# Patient Record
Sex: Female | Born: 1965 | Race: Black or African American | Hispanic: No | Marital: Married | Smoking: Former smoker
Health system: Southern US, Community
[De-identification: ages and names within clinical notes are randomized; demographics above are authoritative.]

## PROBLEM LIST (undated history)

## (undated) DIAGNOSIS — F410 Panic disorder [episodic paroxysmal anxiety] without agoraphobia: Secondary | ICD-10-CM

## (undated) DIAGNOSIS — R7303 Prediabetes: Secondary | ICD-10-CM

## (undated) DIAGNOSIS — D649 Anemia, unspecified: Secondary | ICD-10-CM

## (undated) DIAGNOSIS — Z9889 Other specified postprocedural states: Secondary | ICD-10-CM

## (undated) DIAGNOSIS — N393 Stress incontinence (female) (male): Secondary | ICD-10-CM

## (undated) DIAGNOSIS — Z87442 Personal history of urinary calculi: Secondary | ICD-10-CM

## (undated) DIAGNOSIS — Z973 Presence of spectacles and contact lenses: Secondary | ICD-10-CM

## (undated) DIAGNOSIS — J302 Other seasonal allergic rhinitis: Secondary | ICD-10-CM

## (undated) DIAGNOSIS — E269 Hyperaldosteronism, unspecified: Secondary | ICD-10-CM

## (undated) DIAGNOSIS — F32A Depression, unspecified: Secondary | ICD-10-CM

## (undated) DIAGNOSIS — Z862 Personal history of diseases of the blood and blood-forming organs and certain disorders involving the immune mechanism: Secondary | ICD-10-CM

## (undated) DIAGNOSIS — F411 Generalized anxiety disorder: Secondary | ICD-10-CM

## (undated) DIAGNOSIS — I10 Essential (primary) hypertension: Secondary | ICD-10-CM

## (undated) DIAGNOSIS — M1990 Unspecified osteoarthritis, unspecified site: Secondary | ICD-10-CM

## (undated) DIAGNOSIS — E785 Hyperlipidemia, unspecified: Secondary | ICD-10-CM

## (undated) DIAGNOSIS — D259 Leiomyoma of uterus, unspecified: Secondary | ICD-10-CM

## (undated) DIAGNOSIS — D509 Iron deficiency anemia, unspecified: Secondary | ICD-10-CM

## (undated) DIAGNOSIS — G8929 Other chronic pain: Secondary | ICD-10-CM

## (undated) DIAGNOSIS — R32 Unspecified urinary incontinence: Secondary | ICD-10-CM

## (undated) DIAGNOSIS — N950 Postmenopausal bleeding: Secondary | ICD-10-CM

## (undated) DIAGNOSIS — N200 Calculus of kidney: Secondary | ICD-10-CM

## (undated) DIAGNOSIS — G9782 Other postprocedural complications and disorders of nervous system: Secondary | ICD-10-CM

## (undated) DIAGNOSIS — G9600 Cerebrospinal fluid leak, unspecified: Secondary | ICD-10-CM

## (undated) DIAGNOSIS — K5900 Constipation, unspecified: Secondary | ICD-10-CM

## (undated) DIAGNOSIS — U071 COVID-19: Secondary | ICD-10-CM

## (undated) DIAGNOSIS — T7840XA Allergy, unspecified, initial encounter: Secondary | ICD-10-CM

## (undated) DIAGNOSIS — M545 Low back pain, unspecified: Secondary | ICD-10-CM

## (undated) DIAGNOSIS — E538 Deficiency of other specified B group vitamins: Secondary | ICD-10-CM

## (undated) DIAGNOSIS — Z8489 Family history of other specified conditions: Secondary | ICD-10-CM

## (undated) DIAGNOSIS — F329 Major depressive disorder, single episode, unspecified: Secondary | ICD-10-CM

## (undated) DIAGNOSIS — F419 Anxiety disorder, unspecified: Secondary | ICD-10-CM

## (undated) HISTORY — DX: Unspecified osteoarthritis, unspecified site: M19.90

## (undated) HISTORY — PX: SMALL INTESTINE SURGERY: SHX150

## (undated) HISTORY — DX: Constipation, unspecified: K59.00

## (undated) HISTORY — PX: ABDOMINAL HYSTERECTOMY: SHX81

## (undated) HISTORY — PX: TUBAL LIGATION: SHX77

## (undated) HISTORY — PX: OTHER SURGICAL HISTORY: SHX169

## (undated) HISTORY — DX: Allergy, unspecified, initial encounter: T78.40XA

## (undated) HISTORY — PX: WISDOM TOOTH EXTRACTION: SHX21

## (undated) HISTORY — PX: BUNIONECTOMY: SHX129

---

## 1999-08-05 HISTORY — PX: LAPAROSCOPIC TUBAL LIGATION: SUR803

## 1999-08-05 HISTORY — PX: TUBAL LIGATION: SHX77

## 2008-11-24 ENCOUNTER — Ambulatory Visit (HOSPITAL_COMMUNITY): Admission: RE | Admit: 2008-11-24 | Discharge: 2008-11-24 | Payer: Self-pay | Admitting: Obstetrics

## 2008-12-11 ENCOUNTER — Emergency Department (HOSPITAL_COMMUNITY): Admission: EM | Admit: 2008-12-11 | Discharge: 2008-12-11 | Payer: Self-pay | Admitting: Family Medicine

## 2009-03-27 ENCOUNTER — Emergency Department (HOSPITAL_COMMUNITY): Admission: EM | Admit: 2009-03-27 | Discharge: 2009-03-27 | Payer: Self-pay | Admitting: Emergency Medicine

## 2009-08-29 ENCOUNTER — Encounter: Admission: RE | Admit: 2009-08-29 | Discharge: 2009-08-29 | Payer: Self-pay | Admitting: Obstetrics

## 2010-10-29 ENCOUNTER — Other Ambulatory Visit: Payer: Self-pay | Admitting: Obstetrics

## 2010-10-29 DIAGNOSIS — Z1231 Encounter for screening mammogram for malignant neoplasm of breast: Secondary | ICD-10-CM

## 2010-11-05 ENCOUNTER — Ambulatory Visit: Payer: Self-pay

## 2010-11-12 LAB — POCT PREGNANCY, URINE: Preg Test, Ur: NEGATIVE

## 2010-11-12 LAB — POCT URINALYSIS DIP (DEVICE)
Ketones, ur: NEGATIVE mg/dL
Nitrite: NEGATIVE
Specific Gravity, Urine: 1.015 (ref 1.005–1.030)

## 2011-11-21 ENCOUNTER — Encounter (HOSPITAL_COMMUNITY): Payer: Self-pay | Admitting: Emergency Medicine

## 2011-11-21 ENCOUNTER — Emergency Department (HOSPITAL_COMMUNITY)
Admission: EM | Admit: 2011-11-21 | Discharge: 2011-11-21 | Disposition: A | Discharge status: Home / Self Care | Payer: Federal, State, Local not specified - PPO | Attending: Emergency Medicine | Admitting: Emergency Medicine

## 2011-11-21 DIAGNOSIS — R071 Chest pain on breathing: Secondary | ICD-10-CM | POA: Insufficient documentation

## 2011-11-21 DIAGNOSIS — Z87442 Personal history of urinary calculi: Secondary | ICD-10-CM | POA: Insufficient documentation

## 2011-11-21 DIAGNOSIS — M79609 Pain in unspecified limb: Secondary | ICD-10-CM | POA: Insufficient documentation

## 2011-11-21 DIAGNOSIS — R0789 Other chest pain: Secondary | ICD-10-CM

## 2011-11-21 DIAGNOSIS — R6884 Jaw pain: Secondary | ICD-10-CM | POA: Insufficient documentation

## 2011-11-21 HISTORY — DX: Anemia, unspecified: D64.9

## 2011-11-21 LAB — POCT I-STAT TROPONIN I: Troponin i, poc: 0 ng/mL (ref 0.00–0.08)

## 2011-11-21 LAB — CBC
MCH: 19.5 pg — ABNORMAL LOW (ref 26.0–34.0)
MCHC: 30.7 g/dL (ref 30.0–36.0)
MCV: 63.7 fL — ABNORMAL LOW (ref 78.0–100.0)
RDW: 17.6 % — ABNORMAL HIGH (ref 11.5–15.5)
WBC: 5.3 10*3/uL (ref 4.0–10.5)

## 2011-11-21 MED ORDER — MORPHINE SULFATE 4 MG/ML IJ SOLN
4.0000 mg | Freq: Once | INTRAMUSCULAR | Status: AC
  Administered 2011-11-21: 4 mg via INTRAVENOUS
  Filled 2011-11-21: qty 1

## 2011-11-21 MED ORDER — ASPIRIN 81 MG PO CHEW
CHEWABLE_TABLET | ORAL | Status: AC
  Administered 2011-11-21: 324 mg
  Filled 2011-11-21: qty 4

## 2011-11-21 MED ORDER — ASPIRIN 81 MG PO TABS
324.0000 mg | ORAL_TABLET | Freq: Once | ORAL | Status: AC
  Administered 2011-11-21: 324 mg via ORAL

## 2011-11-21 NOTE — ED Notes (Signed)
Pt pending second set of cardiac enzymes, if normal, pt will be d/c'ed home. Set to be drawn around 0815.

## 2011-11-21 NOTE — ED Notes (Signed)
MD at bedside. 

## 2011-11-21 NOTE — ED Provider Notes (Addendum)
History     CSN: [id number]  Arrival date & time 11/21/11  7829   First MD Initiated Contact with Patient 11/21/11 [phone]      Chief Complaint  Patient presents with  . Jaw Pain    (Consider location/radiation/quality/duration/timing/severity/associated sxs/prior treatment) HPI Complains of left jaw pain left arm pain and left anterior chest pain awakened her from sleep 3 AM today pain is constant mild nothing makes symptoms better or worse no associated nausea shortness of breath or sweatiness. No back pain or pain between scapulae. No treatment prior to coming here no other associated symptoms  . Pain is dull in quality Past Medical History  Diagnosis Date  . Kidney stones   . Anemia     No past surgical history on file.  No family history on file.  History  Substance Use Topics  . Smoking status: Not on file  . Smokeless tobacco: Not on file  . Alcohol Use:     OB History    Grav Para Term Preterm Abortions TAB SAB Ect Mult Living                  Review of Systems  Constitutional: Negative.   HENT: Negative.   Respiratory: Negative.   Cardiovascular: Positive for chest pain.  Gastrointestinal: Negative.   Genitourinary:       Currently on menses  Musculoskeletal: Negative.   Skin: Negative.   Neurological: Negative.   Hematological: Negative.   Psychiatric/Behavioral: Negative.   All other systems reviewed and are negative.    Allergies  Review of patient's allergies indicates no known allergies.  Home Medications  No current outpatient prescriptions on file.  BP 146/90  Pulse 62  Temp(Src) 97.6 F (36.4 C) (Oral)  Resp 13  Ht 5\' 9"  (1.753 m)  Wt 200 lb (90.719 kg)  BMI 29.53 kg/m2  SpO2 100%  Physical Exam  Nursing note and vitals reviewed. Constitutional: She appears well-developed and well-nourished.  HENT:  Head: Normocephalic and atraumatic.  Eyes: Conjunctivae are normal. Pupils are equal, round, and reactive to light.  Neck: Neck  supple. No tracheal deviation present. No thyromegaly present.  Cardiovascular: Normal rate and regular rhythm.   No murmur heard. Pulmonary/Chest: Effort normal and breath sounds normal.  Abdominal: Soft. Bowel sounds are normal. She exhibits no distension. There is no tenderness.  Musculoskeletal: Normal range of motion. She exhibits no edema and no tenderness.  Neurological: She is alert. Coordination normal.  Skin: Skin is warm and dry. No rash noted.  Psychiatric: She has a normal mood and affect.    ED Course  Procedures (including critical care time)  Date: 11/21/2011  Rate: 79  Rhythm: normal sinus rhythm  QRS Axis: normal  Intervals: normal  ST/T Wave abnormalities: normal  Conduction Disutrbances: none  Narrative Interpretation: unremarkable No old EKG for comparison   Labs Reviewed - No data to display No results found.   No diagnosis found.  8:50 AM patient asymptomatic and pain free after treatment with intravenous morphine  MDM  Strongly doubt acute coronary syndrome with atypical symptoms normal EKG 2 sets of normal markers in this young menstruating female Case [facility] Plan: Office will call patient on 11/24/2011 to arrange further outpatient testing Diagnoses #1atypical chest pain #2 elevated blood pressure       Doug Sou, MD 11/21/11 3086  Doug Sou, MD 11/21/11 [phone]

## 2011-11-21 NOTE — Discharge Instructions (Signed)
Chest Pain (Nonspecific) Chest pain has many causes. Your pain could be caused by something serious, such as a heart attack or a blood clot in the lungs. It could also be caused by something less serious, such as a chest bruise or a virus. Follow up with your doctor. More lab tests or other studies may be needed to find the cause of your pain. Most of the time, nonspecific chest pain will improve within 2 to 3 days of rest and mild pain medicine. HOME CARE  For chest bruises, you may put ice on the sore area for 15 to 20 minutes, 3 to 4 times a day. Do this only if it makes you or your child feel better.   Put ice in a plastic bag.   Place a towel between the skin and the bag.   Rest for the next 2 to 3 days.   Go back to work if the pain improves.   See your doctor if the pain lasts longer than 1 to 2 weeks.   Only take medicine as told by your doctor.   Quit smoking if you smoke.  GET HELP RIGHT AWAY IF:   There is more pain or pain that spreads to the arm, neck, jaw, back, or belly (abdomen).   You or your child has shortness of breath.   You or your child coughs more than usual or coughs up blood.   You or your child has very bad back or belly pain, feels sick to his or her stomach (nauseous), or throws up (vomits).   You or your child has very bad weakness.   You or your child passes out (faints).   You or your child has a temperature by mouth above 102 F (38.9 C), not controlled by medicine.  Any of these problems may be serious and may be an emergency. Do not wait to see if the problems will go away. Get medical help right away. Call your local emergency services 911 in [place] Do not drive yourself to the hospital. MAKE SURE YOU:   Understand these instructions.   Will watch this condition.   Will get help right away if you or your child is not doing well or gets worse.  Document Released: 01/07/2008 Document Revised: 07/10/2011 Document Reviewed:  01/07/2008 [facility] Patient Information 2012 [place], [place].   The [facility] cardiology office we'll call you on 11/24/2011 to schedule an appointment at their office next week to check her heart further. If you don't hear from the office by Monday afternoon call to schedule an appointment. Return if her condition worsens for any reason. Your blood pressure should be rechecked within the next 3 weeks

## 2011-11-21 NOTE — ED Notes (Signed)
PT reports 30 minutes ago she was awaken by jaw pain radiating down to arm; and very dull pain in back in between shoulder blades.

## 2011-11-24 ENCOUNTER — Encounter: Payer: Self-pay | Admitting: *Deleted

## 2011-11-24 ENCOUNTER — Encounter: Payer: Self-pay | Admitting: Cardiovascular Disease

## 2011-11-24 ENCOUNTER — Ambulatory Visit (INDEPENDENT_AMBULATORY_CARE_PROVIDER_SITE_OTHER): Payer: Federal, State, Local not specified - PPO | Admitting: Cardiovascular Disease

## 2011-11-24 VITALS — BP 152/100 | HR 78 | Ht 69.0 in | Wt 217.8 lb

## 2011-11-24 DIAGNOSIS — R079 Chest pain, unspecified: Secondary | ICD-10-CM | POA: Insufficient documentation

## 2011-11-24 DIAGNOSIS — I10 Essential (primary) hypertension: Secondary | ICD-10-CM | POA: Insufficient documentation

## 2011-11-24 DIAGNOSIS — R072 Precordial pain: Secondary | ICD-10-CM

## 2011-11-24 MED ORDER — HYDROCHLOROTHIAZIDE 25 MG PO TABS: 25.0000 mg | ORAL_TABLET | Freq: Every day | ORAL | Status: DC

## 2011-11-24 NOTE — Patient Instructions (Addendum)
Your physician has recommended you make the following change in your medication: START HCTZ 25mg  take one by mouth daily  Your physician has requested that you have an exercise tolerance test in 2 WEEKS. For further information please visit [email or web address] Please also follow instruction sheet, as given.  Your physician recommends that you return for a FASTING LIPID, LIVER and BMP in 2 WEEKS--nothing to eat or drink after midnight  You have been referred to [facility].   Your physician recommends that you schedule a follow-up appointment as needed with Dr Excell Seltzer.

## 2011-11-24 NOTE — Assessment & Plan Note (Signed)
I think anxiety is playing a significant role, but the patient has markedly elevated blood pressure. I suspect there is at least some component of essential hypertension here. I recommended that she start hydrochlorothiazide 25 mg daily. She will have a followup metabolic panel drawn in a few weeks. We'll reassess her blood pressure when she comes in for treadmill study.

## 2011-11-24 NOTE — Assessment & Plan Note (Signed)
The patient has chest pain with typical and atypical features. She is at low risk of coronary disease considering her age and lack of heart attack risk factors. There is a clear component of anxiety. With a normal resting EKG, I recommended an exercise treadmill test. Which comes in for a treadmill study of like her to have fasting lipids drawn. I also recommended that she establish with a primary care physician and a referral will be made.

## 2011-11-24 NOTE — Progress Notes (Signed)
HPI:  46 year old woman presenting for evaluation of chest pain. The patient was recently seen in the emergency department with chest discomfort. She complains of a constant pain in the center of her chest. This is reproduced with palpation. The pain radiates to her jaw and left arm. It comes and goes but it is there most of the time. She associates this with stress. She's had an extreme amount of stress at work recently and is very anxious overall. She notes that her blood pressures been elevated over recent weeks, but this has not been a problem in the past. The patient has no exertional chest tightness or discomfort. During her emergency room evaluation. Serial cardiac enzymes were negative and EKGs were within normal limits. Outpatient cardiology followup is recommended. She has no personal history of cardiac disease. There is no family history of coronary artery disease or myocardial infarction. The patient is a nonsmoker. She does not know her lipid status. She doesn't follow with her primary care physician.  Outpatient Encounter Prescriptions as of 11/24/2011  Medication Sig Dispense Refill  . acetaminophen (TYLENOL) 325 MG tablet Take 650 mg by mouth as needed.      Marland Kitchen DISCONTD: acetaminophen-codeine (TYLENOL #3) 300-30 MG per tablet Take 1 tablet by mouth as needed.      Marland Kitchen DISCONTD: fexofenadine-pseudoephedrine (ALLEGRA-D) 60-120 MG per tablet Take 1 tablet by mouth 2 (two) times daily.      Marland Kitchen DISCONTD: Phenyleph-CPM-DM-APAP (TYLENOL COLD MULTI-SYMPTOM PO) Take by mouth as needed.        Review of patient's allergies indicates no known allergies.  Past Medical History  Diagnosis Date  . Kidney stones   . Anemia     Past Surgical History  Procedure Date  . Caesarean section   . Tubal ligation   . Cystoscopic     extraction of ureteric calculus without disintegration    History   Social History  . Marital Status: Married    Spouse Name: N/A    Number of Children: N/A  . Years  of Education: N/A   Occupational History  . Not on file.   Social History Main Topics  . Smoking status: Former Games developer  . Smokeless tobacco: Not on file  . Alcohol Use: No  . Drug Use: No  . Sexually Active: Not on file   Other Topics Concern  . Not on file   Social History Narrative  . No narrative on file    Family History  Problem Relation Age of Onset  . Asthma    . Hypothyroidism      ROS: General: no fevers/chills/night sweats Eyes: no blurry vision, diplopia, or amaurosis ENT: no sore throat or hearing loss Resp: no cough, wheezing, or hemoptysis CV: no edema. Positive for palpitations GI: no abdominal pain, nausea, vomiting, diarrhea, or constipation GU: no dysuria, frequency, or hematuria Skin: no rash Neuro: no headache, numbness, tingling, or weakness of extremities Musculoskeletal: no joint pain or swelling Heme: no bleeding, DVT, or easy bruising Endo: no polydipsia or polyuria  BP 152/100  Pulse 78  Ht 5\' 9"  (1.753 m)  Wt 98.793 kg (217 lb 12.8 oz)  BMI 32.16 kg/m2  PHYSICAL EXAM: Pt is alert and oriented, WD, WN, pleasant, anxious overweight woman in no distress. HEENT: normal Neck: JVP normal. Carotid upstrokes normal without bruits. No thyromegaly. Chest: Equal expansion, there is tenderness over the left parasternal region reproducing the patient's chest pain Lungs: equal expansion, clear bilaterally CV: Apex is discrete and nondisplaced,  RRR without murmur or gallop Abd: soft, NT, +BS, no bruit, no hepatosplenomegaly Back: no CVA tenderness Ext: no C/C/E        DP/PT pulses intact and = Skin: warm and dry without rash Neuro: CNII-XII intact             Strength intact = bilaterally  EKG:  Normal sinus rhythm 78 beats per minute, within normal limits.  ASSESSMENT AND PLAN:

## 2011-12-11 ENCOUNTER — Ambulatory Visit (INDEPENDENT_AMBULATORY_CARE_PROVIDER_SITE_OTHER): Payer: Federal, State, Local not specified - PPO | Admitting: Nurse Practitioner

## 2011-12-11 ENCOUNTER — Encounter: Payer: Self-pay | Admitting: Nurse Practitioner

## 2011-12-11 DIAGNOSIS — R072 Precordial pain: Secondary | ICD-10-CM

## 2011-12-11 DIAGNOSIS — I10 Essential (primary) hypertension: Secondary | ICD-10-CM

## 2011-12-11 NOTE — Procedures (Signed)
Exercise Treadmill Test  Pre-Exercise Testing Evaluation Rhythm: normal sinus  Rate: 94   PR:  .14 QRS:  .08  QT:  .32 QTc: .41     Test  Exercise Tolerance Test Ordering MD: Tonny Bollman, MD  Interpreting MD: Ward Givens NP  Unique Test No: 1  Treadmill:  1  Indication for ETT: chest pain - rule out ischemia  Contraindication to ETT: No   Stress Modality: exercise - treadmill  Cardiac Imaging Performed: non   Protocol: standard Bruce - maximal  Max BP: 163/62  Max MPHR (bpm):  176 85% MPR (bpm):  149  MPHR obtained (bpm):  176 % MPHR obtained:  100%  Reached 85% MPHR (min:sec):  4:12 Total Exercise Time (min-sec):  9:00  Workload in METS:  10.1 Borg Scale: 17  Reason ETT Terminated:  fatigue    ST Segment Analysis At Rest: normal ST segments - no evidence of significant ST depression - TWI III, aVF @ baseline. With Exercise: no evidence of significant ST depression  Other Information Arrhythmia:  No Angina during ETT:  absent (0) Quality of ETT:  diagnostic  ETT Interpretation:  normal - no evidence of ischemia by ST analysis  Comments: Good exercise tolerance. No chest pain.  No acute st/t changes.  Recommendations: F/u Dr. Excell Seltzer

## 2011-12-25 ENCOUNTER — Other Ambulatory Visit (INDEPENDENT_AMBULATORY_CARE_PROVIDER_SITE_OTHER): Payer: Federal, State, Local not specified - PPO

## 2011-12-25 DIAGNOSIS — R072 Precordial pain: Secondary | ICD-10-CM

## 2011-12-25 DIAGNOSIS — I10 Essential (primary) hypertension: Secondary | ICD-10-CM

## 2011-12-25 LAB — LIPID PANEL
Cholesterol: 118 mg/dL (ref 0–200)
LDL Cholesterol: 58 mg/dL (ref 0–99)
Triglycerides: 82 mg/dL (ref 0.0–149.0)

## 2011-12-25 LAB — BASIC METABOLIC PANEL
BUN: 10 mg/dL (ref 6–23)
Calcium: 9.9 mg/dL (ref 8.4–10.5)
Chloride: 102 mEq/L (ref 96–112)
Creatinine, Ser: 0.7 mg/dL (ref 0.4–1.2)

## 2011-12-25 LAB — HEPATIC FUNCTION PANEL
ALT: 17 U/L (ref 0–35)
Total Bilirubin: 0.3 mg/dL (ref 0.3–1.2)

## 2012-01-09 ENCOUNTER — Ambulatory Visit (INDEPENDENT_AMBULATORY_CARE_PROVIDER_SITE_OTHER): Payer: Federal, State, Local not specified - PPO | Admitting: Internal Medicine

## 2012-01-09 ENCOUNTER — Other Ambulatory Visit (INDEPENDENT_AMBULATORY_CARE_PROVIDER_SITE_OTHER): Payer: Federal, State, Local not specified - PPO

## 2012-01-09 ENCOUNTER — Encounter: Payer: Self-pay | Admitting: Internal Medicine

## 2012-01-09 VITALS — BP 120/70 | HR 73 | Temp 98.6°F | Resp 16 | Ht 69.0 in | Wt 219.0 lb

## 2012-01-09 DIAGNOSIS — D649 Anemia, unspecified: Secondary | ICD-10-CM

## 2012-01-09 DIAGNOSIS — Z23 Encounter for immunization: Secondary | ICD-10-CM

## 2012-01-09 DIAGNOSIS — I10 Essential (primary) hypertension: Secondary | ICD-10-CM

## 2012-01-09 DIAGNOSIS — E669 Obesity, unspecified: Secondary | ICD-10-CM | POA: Insufficient documentation

## 2012-01-09 DIAGNOSIS — E538 Deficiency of other specified B group vitamins: Secondary | ICD-10-CM

## 2012-01-09 DIAGNOSIS — Z1231 Encounter for screening mammogram for malignant neoplasm of breast: Secondary | ICD-10-CM

## 2012-01-09 DIAGNOSIS — D519 Vitamin B12 deficiency anemia, unspecified: Secondary | ICD-10-CM | POA: Insufficient documentation

## 2012-01-09 LAB — CBC WITH DIFFERENTIAL/PLATELET
Basophils Relative: 1.4 % (ref 0.0–3.0)
Eosinophils Absolute: 0.2 10*3/uL (ref 0.0–0.7)
Eosinophils Relative: 2.9 % (ref 0.0–5.0)
HCT: 36.5 % (ref 36.0–46.0)
Hemoglobin: 11.3 g/dL — ABNORMAL LOW (ref 12.0–15.0)
MCHC: 30.9 g/dL (ref 30.0–36.0)
MCV: 65.7 fl — ABNORMAL LOW (ref 78.0–100.0)
Monocytes Absolute: 0.7 10*3/uL (ref 0.1–1.0)
Neutro Abs: 2.7 10*3/uL (ref 1.4–7.7)
Neutrophils Relative %: 49.3 % (ref 43.0–77.0)
RBC: 5.55 Mil/uL — ABNORMAL HIGH (ref 3.87–5.11)
WBC: 5.4 10*3/uL (ref 4.5–10.5)

## 2012-01-09 LAB — TSH: TSH: 1.02 u[IU]/mL (ref 0.35–5.50)

## 2012-01-09 LAB — FOLATE: Folate: >24.8 ng/mL (ref >5.9)

## 2012-01-09 LAB — IBC PANEL: Saturation Ratios: 11.2 % — ABNORMAL LOW (ref 20.0–50.0)

## 2012-01-09 MED ORDER — CYANOCOBALAMIN 1000 MCG/ML IJ SOLN
1000.0000 ug | Freq: Once | INTRAMUSCULAR | Status: AC
  Administered 2012-01-09: 1000 ug via INTRAMUSCULAR

## 2012-01-09 MED ORDER — PHENTERMINE HCL 37.5 MG PO CAPS: 37.5000 mg | ORAL_CAPSULE | ORAL | Status: DC

## 2012-01-09 NOTE — Progress Notes (Signed)
  Subjective:    Patient ID: Mary Sanford, female    DOB: 1965/08/15, 46 y.o.   MRN: [id number]  Anemia Presents for follow-up visit. Symptoms include light-headedness. There has been no abdominal pain, anorexia, bruising/bleeding easily, confusion, fever, leg swelling, malaise/fatigue, pallor, palpitations, paresthesias, pica or weight loss. Signs of blood loss that are present include menorrhagia. Signs of blood loss that are not present include hematemesis, hematochezia, melena and vaginal bleeding. There are no compliance problems.       Review of Systems  Constitutional: Positive for unexpected weight change (some weight gain). Negative for fever, chills, weight loss, malaise/fatigue, diaphoresis, activity change, appetite change and fatigue.  HENT: Negative.   Eyes: Negative.   Respiratory: Negative for apnea, cough, choking, chest tightness, shortness of breath, wheezing and stridor.   Cardiovascular: Negative for chest pain, palpitations and leg swelling.  Gastrointestinal: Negative for nausea, vomiting, abdominal pain, diarrhea, constipation, blood in stool, melena, hematochezia, abdominal distention, anal bleeding, anorexia and hematemesis.  Genitourinary: Positive for menorrhagia. Negative for vaginal bleeding.  Musculoskeletal: Negative for myalgias, back pain, joint swelling, arthralgias and gait problem.  Skin: Negative for color change, pallor and rash.  Neurological: Positive for light-headedness. Negative for dizziness, tremors, seizures, syncope, facial asymmetry, speech difficulty, weakness, numbness, headaches and paresthesias.  Hematological: Negative for adenopathy. Does not bruise/bleed easily.  Psychiatric/Behavioral: Negative.  Negative for confusion.       Objective:   Physical Exam  Vitals reviewed. Constitutional: She is oriented to person, place, and time. She appears well-developed and well-nourished. No distress.  HENT:  Head: Normocephalic and  atraumatic.  Mouth/Throat: Oropharynx is clear and moist. No oropharyngeal exudate.  Eyes: Conjunctivae are normal. Right eye exhibits no discharge. Left eye exhibits no discharge. No scleral icterus.  Neck: Normal range of motion. Neck supple. No JVD present. No tracheal deviation present. No thyromegaly present.  Cardiovascular: Normal rate, regular rhythm, normal heart sounds and intact distal pulses.  Exam reveals no gallop and no friction rub.   No murmur heard. Pulmonary/Chest: Effort normal and breath sounds normal. No stridor. No respiratory distress. She has no wheezes. She has no rales. She exhibits no tenderness.  Abdominal: Soft. Bowel sounds are normal. She exhibits no distension and no mass. There is no tenderness. There is no rebound and no guarding.  Musculoskeletal: Normal range of motion. She exhibits no edema and no tenderness.  Lymphadenopathy:    She has no cervical adenopathy.  Neurological: She is oriented to person, place, and time.  Skin: Skin is warm and dry. No rash noted. She is not diaphoretic. No erythema. No pallor.  Psychiatric: She has a normal mood and affect. Her behavior is normal. Judgment and thought content normal.      Lab Results  Component Value Date   WBC 5.3 11/21/2011   HGB 10.7* 11/21/2011   HCT 34.9* 11/21/2011   PLT 275 11/21/2011   GLUCOSE 93 12/25/2011   CHOL 118 12/25/2011   TRIG 82.0 12/25/2011   HDL 43.40 12/25/2011   LDLCALC 58 12/25/2011   ALT 17 12/25/2011   AST 18 12/25/2011   NA 136 12/25/2011   K 3.8 12/25/2011   CL 102 12/25/2011   CREATININE 0.7 12/25/2011   BUN 10 12/25/2011   CO2 27 12/25/2011      Assessment & Plan:

## 2012-01-09 NOTE — Patient Instructions (Signed)
Anemia, Nonspecific Your exam and blood tests show you are anemic. This means your blood (hemoglobin) level is low. Normal hemoglobin values are 12 to 15 g/dL for females and 14 to 17 g/dL for males. Make a note of your hemoglobin level today. The hematocrit percent is also used to measure anemia. A normal hematocrit is 38% to 46% in females and 42% to 49% in males. Make a note of your hematocrit level today. CAUSES  Anemia can be due to many different causes.  Excessive bleeding from periods (in women).   Intestinal bleeding.   Poor nutrition.   Kidney, thyroid, liver, and bone marrow diseases.  SYMPTOMS  Anemia can come on suddenly (acute). It can also come on slowly. Symptoms can include:  Minor weakness.   Dizziness.   Palpitations.   Shortness of breath.  Symptoms may be absent until half your hemoglobin is missing if it comes on slowly. Anemia due to acute blood loss from an injury or internal bleeding may require blood transfusion if the loss is severe. Hospital care is needed if you are anemic and there is significant continual blood loss. TREATMENT   Stool tests for blood (Hemoccult) and additional lab tests are often needed. This determines the best treatment.   Further checking on your condition and your response to treatment is very important. It often takes many weeks to correct anemia.  Depending on the cause, treatment can include:  Supplements of iron.   Vitamins B12 and folic acid.   Hormone medicines.If your anemia is due to bleeding, finding the cause of the blood loss is very important. This will help avoid further problems.  SEEK IMMEDIATE MEDICAL CARE IF:   You develop fainting, extreme weakness, shortness of breath, or chest pain.   You develop heavy vaginal bleeding.   You develop bloody or black, tarry stools or vomit up blood.   You develop a high fever, rash, repeated vomiting, or dehydration.  Document Released: 08/28/2004 Document Revised:  07/10/2011 Document Reviewed: 06/05/2009 [employer] Patient Information 2012 [employer]. 

## 2012-01-11 ENCOUNTER — Encounter: Payer: Self-pay | Admitting: Internal Medicine

## 2012-01-12 DIAGNOSIS — Z1231 Encounter for screening mammogram for malignant neoplasm of breast: Secondary | ICD-10-CM | POA: Insufficient documentation

## 2012-01-12 NOTE — Assessment & Plan Note (Signed)
She wants to try an appetite suppressant so I wrote for phentermine, I have asked her to return in 3-4 weeks for a recheck on her BP, she will let me know sooner if she has any SE's from phentermine

## 2012-01-12 NOTE — Assessment & Plan Note (Signed)
I will recheck her CBC and will look at her vitamin levels as well 

## 2012-01-12 NOTE — Assessment & Plan Note (Signed)
Her BP is well controlled 

## 2012-01-23 ENCOUNTER — Ambulatory Visit (INDEPENDENT_AMBULATORY_CARE_PROVIDER_SITE_OTHER): Payer: Federal, State, Local not specified - PPO

## 2012-01-23 DIAGNOSIS — D649 Anemia, unspecified: Secondary | ICD-10-CM

## 2012-01-23 MED ORDER — CYANOCOBALAMIN 1000 MCG/ML IJ SOLN
1000.0000 ug | Freq: Once | INTRAMUSCULAR | Status: AC
  Administered 2012-01-23: 1000 ug via INTRAMUSCULAR

## 2012-02-04 ENCOUNTER — Ambulatory Visit: Payer: Federal, State, Local not specified - PPO

## 2012-02-09 ENCOUNTER — Telehealth: Payer: Self-pay | Admitting: Internal Medicine

## 2012-02-09 ENCOUNTER — Ambulatory Visit (INDEPENDENT_AMBULATORY_CARE_PROVIDER_SITE_OTHER): Payer: Federal, State, Local not specified - PPO | Admitting: Internal Medicine

## 2012-02-09 ENCOUNTER — Encounter: Payer: Self-pay | Admitting: Internal Medicine

## 2012-02-09 VITALS — BP 128/76 | HR 88 | Temp 98.0°F | Resp 16 | Wt 203.0 lb

## 2012-02-09 DIAGNOSIS — I10 Essential (primary) hypertension: Secondary | ICD-10-CM

## 2012-02-09 DIAGNOSIS — D649 Anemia, unspecified: Secondary | ICD-10-CM

## 2012-02-09 DIAGNOSIS — E669 Obesity, unspecified: Secondary | ICD-10-CM

## 2012-02-09 MED ORDER — PHENTERMINE HCL 37.5 MG PO CAPS: 37.5000 mg | ORAL_CAPSULE | ORAL | Status: DC

## 2012-02-09 MED ORDER — CYANOCOBALAMIN 1000 MCG/ML IJ SOLN
1000.0000 ug | Freq: Once | INTRAMUSCULAR | Status: AC
  Administered 2012-02-09: 1000 ug via INTRAMUSCULAR

## 2012-02-09 NOTE — Telephone Encounter (Signed)
Forward 40 pages to Dr. Sanda Linger for review on 02-09-12 ym

## 2012-02-09 NOTE — Assessment & Plan Note (Signed)
She is doing well on phentermine

## 2012-02-09 NOTE — Patient Instructions (Signed)

## 2012-02-09 NOTE — Progress Notes (Signed)
  Subjective:    Patient ID: Mary Sanford, female    DOB: 1966/04/09, 46 y.o.   MRN: [id number]  Hypertension This is a chronic problem. The current episode started more than 1 year ago. The problem has been gradually improving since onset. The problem is controlled. Pertinent negatives include no anxiety, blurred vision, chest pain, headaches, malaise/fatigue, neck pain, orthopnea, palpitations, peripheral edema, PND, shortness of breath or sweats. Past treatments include diuretics. The current treatment provides significant improvement. There are no compliance problems.       Review of Systems  Constitutional: Negative.  Negative for malaise/fatigue.  HENT: Negative.  Negative for neck pain.   Eyes: Negative.  Negative for blurred vision.  Respiratory: Negative.  Negative for shortness of breath.   Cardiovascular: Negative.  Negative for chest pain, palpitations, orthopnea and PND.  Gastrointestinal: Negative.   Genitourinary: Negative.   Musculoskeletal: Negative.   Skin: Negative.   Neurological: Negative.  Negative for headaches.  Hematological: Negative.   Psychiatric/Behavioral: Negative.  Negative for suicidal ideas, behavioral problems, confusion, dysphoric mood and agitation. The patient is not nervous/anxious.        Objective:   Physical Exam  Vitals reviewed. Constitutional: She is oriented to person, place, and time. She appears well-developed and well-nourished. No distress.  HENT:  Head: Normocephalic and atraumatic.  Mouth/Throat: Oropharynx is clear and moist. No oropharyngeal exudate.  Eyes: Conjunctivae are normal. Right eye exhibits no discharge. Left eye exhibits no discharge. No scleral icterus.  Neck: Normal range of motion. Neck supple. No JVD present. No tracheal deviation present. No thyromegaly present.  Cardiovascular: Normal rate, regular rhythm, normal heart sounds and intact distal pulses.  Exam reveals no gallop and no friction rub.   No murmur  heard. Pulmonary/Chest: Effort normal and breath sounds normal. No stridor. No respiratory distress. She has no wheezes. She has no rales. She exhibits no tenderness.  Abdominal: Soft. Bowel sounds are normal. She exhibits no distension and no mass. There is no tenderness. There is no rebound and no guarding.  Musculoskeletal: Normal range of motion. She exhibits no edema and no tenderness.  Lymphadenopathy:    She has no cervical adenopathy.  Neurological: She is oriented to person, place, and time.  Skin: Skin is warm and dry. No rash noted. She is not diaphoretic. No erythema. No pallor.  Psychiatric: She has a normal mood and affect. Her behavior is normal. Judgment and thought content normal.      Lab Results  Component Value Date   WBC 5.4 01/09/2012   HGB 11.3* 01/09/2012   HCT 36.5 01/09/2012   PLT 245.0 01/09/2012   GLUCOSE 93 12/25/2011   CHOL 118 12/25/2011   TRIG 82.0 12/25/2011   HDL 43.40 12/25/2011   LDLCALC 58 12/25/2011   ALT 17 12/25/2011   AST 18 12/25/2011   NA 136 12/25/2011   K 3.8 12/25/2011   CL 102 12/25/2011   CREATININE 0.7 12/25/2011   BUN 10 12/25/2011   CO2 27 12/25/2011   TSH 1.02 01/09/2012      Assessment & Plan:

## 2012-02-09 NOTE — Assessment & Plan Note (Signed)
Her BP is well controlled 

## 2012-02-18 ENCOUNTER — Ambulatory Visit (HOSPITAL_COMMUNITY)
Admission: RE | Admit: 2012-02-18 | Discharge: 2012-02-18 | Disposition: A | Discharge status: Home / Self Care | Payer: Federal, State, Local not specified - PPO | Source: Ambulatory Visit | Attending: Internal Medicine | Admitting: Internal Medicine

## 2012-02-18 DIAGNOSIS — Z1231 Encounter for screening mammogram for malignant neoplasm of breast: Secondary | ICD-10-CM | POA: Insufficient documentation

## 2012-02-19 LAB — HM MAMMOGRAPHY: HM Mammogram: NORMAL

## 2012-02-23 ENCOUNTER — Ambulatory Visit: Payer: Federal, State, Local not specified - PPO

## 2012-02-24 ENCOUNTER — Ambulatory Visit (INDEPENDENT_AMBULATORY_CARE_PROVIDER_SITE_OTHER): Payer: Federal, State, Local not specified - PPO

## 2012-02-24 DIAGNOSIS — D649 Anemia, unspecified: Secondary | ICD-10-CM

## 2012-02-24 MED ORDER — CYANOCOBALAMIN 1000 MCG/ML IJ SOLN
1000.0000 ug | Freq: Once | INTRAMUSCULAR | Status: AC
  Administered 2012-02-24: 1000 ug via INTRAMUSCULAR

## 2012-03-09 ENCOUNTER — Ambulatory Visit: Payer: Federal, State, Local not specified - PPO

## 2012-03-10 ENCOUNTER — Telehealth: Payer: Self-pay

## 2012-03-10 MED ORDER — CYANOCOBALAMIN 500 MCG/0.1ML NA SOLN: 0.1000 mL | NASAL | Status: DC

## 2012-03-10 NOTE — Telephone Encounter (Signed)
Pt called inquiring whether she can take equivalent dosage of B-12 since injection is currently on back order, please advise.

## 2012-03-10 NOTE — Telephone Encounter (Signed)
Start nascobal ns 

## 2012-04-22 ENCOUNTER — Ambulatory Visit (INDEPENDENT_AMBULATORY_CARE_PROVIDER_SITE_OTHER): Payer: Federal, State, Local not specified - PPO | Admitting: General Practice

## 2012-04-22 DIAGNOSIS — D649 Anemia, unspecified: Secondary | ICD-10-CM

## 2012-04-22 MED ORDER — CYANOCOBALAMIN 1000 MCG/ML IJ SOLN
1000.0000 ug | Freq: Once | INTRAMUSCULAR | Status: AC
  Administered 2012-04-22: 1000 ug via INTRAMUSCULAR

## 2012-04-30 ENCOUNTER — Encounter (HOSPITAL_COMMUNITY): Payer: Self-pay | Admitting: *Deleted

## 2012-04-30 ENCOUNTER — Emergency Department (INDEPENDENT_AMBULATORY_CARE_PROVIDER_SITE_OTHER)
Admission: EM | Admit: 2012-04-30 | Discharge: 2012-04-30 | Disposition: A | Discharge status: Home / Self Care | Payer: Federal, State, Local not specified - PPO | Source: Home / Self Care

## 2012-04-30 DIAGNOSIS — F419 Anxiety disorder, unspecified: Secondary | ICD-10-CM

## 2012-04-30 DIAGNOSIS — R42 Dizziness and giddiness: Secondary | ICD-10-CM

## 2012-04-30 DIAGNOSIS — F410 Panic disorder [episodic paroxysmal anxiety] without agoraphobia: Secondary | ICD-10-CM

## 2012-04-30 DIAGNOSIS — J309 Allergic rhinitis, unspecified: Secondary | ICD-10-CM

## 2012-04-30 DIAGNOSIS — F411 Generalized anxiety disorder: Secondary | ICD-10-CM

## 2012-04-30 NOTE — ED Provider Notes (Signed)
History     CSN: [id number]  Arrival date & time 04/30/12  1145   None     Chief Complaint  Patient presents with  . Headache  . Dizziness    (Consider location/radiation/quality/duration/timing/severity/associated sxs/prior treatment) HPI Comments: 46 year old female was sitting at her desk at work today when she slowly developed dizziness followed by headache on the left side of her head and nasal stuffiness. As she stood up she felt like she going to pass out. Head begin feeling stuffy and she began to panic. She has a history of panic attacks and as this one started her symptoms seem to snowball and developed severe anxiety. There was no syncope. She believes that she may have developed some allergy to a plant that was set on her desk just prior to her developing symptoms she also states her job is very stressful and she has been feeling stress most of the morning. She agreed that she had a small amount of underlying anxiety while at work prior to experiencing any symptoms as stated above and she developed dizziness and left-sided craniofacial discomfort her symptoms got worse and that's when the panic attack develop. When she was able to calm down most her symptoms abated. Symptoms are remained aren't nasal stuffiness and a mild discomfort in the left face. Bending over with face parallel to the floor produces a fullness in the left side of the face. He denies sneezing or tearing she also denies problems with vision speech hearing or swallowing, no focal weakness or paresthesias. Is also noteworthy that she is currently taking phentermine for weight loss   Past Medical History  Diagnosis Date  . Kidney stones   . Anemia     Past Surgical History  Procedure Date  . Caesarean section   . Tubal ligation   . Cystoscopic     extraction of ureteric calculus without disintegration    Family History  Problem Relation Age of Onset  . Asthma    . Hypothyroidism      History    Substance Use Topics  . Smoking status: Former Games developer  . Smokeless tobacco: Not on file  . Alcohol Use: No    OB History    Grav Para Term Preterm Abortions TAB SAB Ect Mult Living                  Review of Systems  Constitutional: Negative for fever, activity change, appetite change and fatigue.  HENT: Positive for congestion, rhinorrhea and sinus pressure. Negative for sore throat, facial swelling, trouble swallowing, neck pain, neck stiffness, voice change, postnasal drip and ear discharge.   Respiratory: Negative for cough, choking, chest tightness and shortness of breath.   Gastrointestinal: Negative.   Genitourinary: Negative.   Musculoskeletal: Negative.   Skin: Negative.   Neurological: Positive for dizziness and headaches. Negative for tremors, seizures, syncope, facial asymmetry, speech difficulty and weakness.  Psychiatric/Behavioral: The patient is nervous/anxious.     Allergies  Review of patient's allergies indicates no known allergies.  Home Medications   Current Outpatient Rx  Name Route Sig Dispense Refill  . CYANOCOBALAMIN 500 MCG/0.1ML NA SOLN Nasal Place 0.1 mLs (500 mcg total) into the nose once a week. 2.3 mL 11  . HYDROCHLOROTHIAZIDE 25 MG PO TABS Oral Take 1 tablet (25 mg total) by mouth daily. 30 tablet 11  . ONE-DAILY MULTI VITAMINS PO TABS Oral Take 1 tablet by mouth daily.    Marland Kitchen PHENTERMINE HCL 37.5 MG PO CAPS Oral  Take 1 capsule (37.5 mg total) by mouth every morning. 30 capsule 3  . ACETAMINOPHEN 325 MG PO TABS Oral Take 650 mg by mouth as needed.      BP 119/73  Pulse 106  Temp 98.2 F (36.8 C)  Resp 18  SpO2 100%  LMP 04/10/2012  Physical Exam  Constitutional: She is oriented to person, place, and time. She appears well-developed and well-nourished. No distress.  HENT:  Head: Normocephalic and atraumatic.  Mouth/Throat: Oropharynx is clear and moist. No oropharyngeal exudate.  Eyes: EOM are normal. Pupils are equal, round, and  reactive to light.  Neck: Normal range of motion. Neck supple.  Cardiovascular: Normal rate and normal heart sounds.   Pulmonary/Chest: Effort normal and breath sounds normal. No respiratory distress. She has no wheezes.  Abdominal: Soft. There is no tenderness.  Musculoskeletal: Normal range of motion. She exhibits no edema and no tenderness.  Neurological: She is alert and oriented to person, place, and time. No cranial nerve deficit.       Cranial nerves II through XII intact. Fine motor skills are intact. Romberg is negative. Heel to toe walk is balanced. No dizziness was experienced during any of these maneuvers. She was also laid back on the bed turned her head to the right than the left and straight up and sat up quickly the symptoms were reproduced. No nystagmus. She had denied vertigo. Logically completely intact.  Skin: Skin is warm and dry.  Psychiatric: She has a normal mood and affect.    ED Course  Procedures (including critical care time)  Labs Reviewed - No data to display No results found.   1. Anxiety   2. Panic disorder   3. Dizziness       MDM  Reassurance. Recommend stopping the Phentermine Claritin for allergies, no decongestants.          Hayden Rasmussen, NP 04/30/12 1331

## 2012-04-30 NOTE — ED Notes (Signed)
"   i started not feeling right 3 or 4 days ago but today all of a sudden I started getting pressure in my head and it felt like my heart was racing and I got really panicky. It felt similar to a panic attack but maybe an allergic reaction - I still feel a little dizzy"

## 2012-04-30 NOTE — ED Provider Notes (Signed)
Medical screening examination/treatment/procedure(s) were performed by non-physician practitioner and as supervising physician I was immediately available for consultation/collaboration.  Leslee Home, M.D.   Reuben Likes, MD 04/30/12 2232

## 2012-05-06 ENCOUNTER — Ambulatory Visit (INDEPENDENT_AMBULATORY_CARE_PROVIDER_SITE_OTHER): Payer: Federal, State, Local not specified - PPO

## 2012-05-06 DIAGNOSIS — D649 Anemia, unspecified: Secondary | ICD-10-CM

## 2012-05-06 MED ORDER — CYANOCOBALAMIN 1000 MCG/ML IJ SOLN
1000.0000 ug | Freq: Once | INTRAMUSCULAR | Status: AC
  Administered 2012-05-06: 1000 ug via INTRAMUSCULAR

## 2012-05-13 ENCOUNTER — Other Ambulatory Visit (INDEPENDENT_AMBULATORY_CARE_PROVIDER_SITE_OTHER): Payer: Federal, State, Local not specified - PPO

## 2012-05-13 ENCOUNTER — Ambulatory Visit (INDEPENDENT_AMBULATORY_CARE_PROVIDER_SITE_OTHER): Payer: Federal, State, Local not specified - PPO | Admitting: Internal Medicine

## 2012-05-13 ENCOUNTER — Encounter: Payer: Self-pay | Admitting: Internal Medicine

## 2012-05-13 VITALS — BP 120/80 | HR 68 | Temp 98.8°F | Resp 16 | Wt 196.0 lb

## 2012-05-13 DIAGNOSIS — I10 Essential (primary) hypertension: Secondary | ICD-10-CM

## 2012-05-13 DIAGNOSIS — E669 Obesity, unspecified: Secondary | ICD-10-CM

## 2012-05-13 DIAGNOSIS — E876 Hypokalemia: Secondary | ICD-10-CM

## 2012-05-13 DIAGNOSIS — D649 Anemia, unspecified: Secondary | ICD-10-CM

## 2012-05-13 LAB — COMPREHENSIVE METABOLIC PANEL
ALT: 16 U/L (ref 0–35)
AST: 17 U/L (ref 0–37)
Albumin: 3.4 g/dL — ABNORMAL LOW (ref 3.5–5.2)
BUN: 8 mg/dL (ref 6–23)
CO2: 30 mEq/L (ref 19–32)
Calcium: 9.5 mg/dL (ref 8.4–10.5)
Chloride: 102 mEq/L (ref 96–112)
GFR: 102.71 mL/min (ref >60.00)
Potassium: 3.1 mEq/L — ABNORMAL LOW (ref 3.5–5.1)

## 2012-05-13 LAB — CBC WITH DIFFERENTIAL/PLATELET
Basophils Absolute: 0 10*3/uL (ref 0.0–0.1)
Eosinophils Absolute: 0.1 10*3/uL (ref 0.0–0.7)
Lymphocytes Relative: 32.6 % (ref 12.0–46.0)
MCHC: 31.2 g/dL (ref 30.0–36.0)
Monocytes Relative: 10.8 % (ref 3.0–12.0)
Neutrophils Relative %: 54.4 % (ref 43.0–77.0)
RDW: 14.9 % — ABNORMAL HIGH (ref 11.5–14.6)

## 2012-05-13 MED ORDER — POTASSIUM CHLORIDE ER 10 MEQ PO TBCR: 10.0000 meq | EXTENDED_RELEASE_TABLET | Freq: Two times a day (BID) | ORAL | Status: DC

## 2012-05-13 MED ORDER — PHENTERMINE HCL 37.5 MG PO CAPS: 37.5000 mg | ORAL_CAPSULE | ORAL | Status: DC

## 2012-05-13 NOTE — Patient Instructions (Signed)

## 2012-05-13 NOTE — Progress Notes (Signed)
  Subjective:    Patient ID: Mary Sanford, female    DOB: 10-05-1965, 46 y.o.   MRN: [id number]  Hypertension This is a chronic problem. The current episode started more than 1 year ago. The problem has been gradually improving since onset. Pertinent negatives include no anxiety, blurred vision, chest pain, headaches, malaise/fatigue, neck pain, orthopnea, palpitations, peripheral edema, PND, shortness of breath or sweats. Agents associated with hypertension include anorectics. Past treatments include diuretics. The current treatment provides significant improvement. Compliance problems include exercise and diet.       Review of Systems  Constitutional: Negative.  Negative for malaise/fatigue.  HENT: Negative.  Negative for neck pain.   Eyes: Negative.  Negative for blurred vision.  Respiratory: Negative.  Negative for shortness of breath.   Cardiovascular: Negative.  Negative for chest pain, palpitations, orthopnea and PND.  Gastrointestinal: Negative.   Genitourinary: Negative.   Musculoskeletal: Negative.   Neurological: Negative.  Negative for headaches.  Hematological: Negative.   Psychiatric/Behavioral: Negative.        Objective:   Physical Exam  Vitals reviewed. Constitutional: She is oriented to person, place, and time. She appears well-developed and well-nourished. No distress.  HENT:  Head: Normocephalic and atraumatic.  Mouth/Throat: Oropharynx is clear and moist. No oropharyngeal exudate.  Eyes: Conjunctivae normal are normal. Right eye exhibits no discharge. Left eye exhibits no discharge. No scleral icterus.  Neck: Normal range of motion. Neck supple. No JVD present. No tracheal deviation present. No thyromegaly present.  Cardiovascular: Normal rate, regular rhythm, normal heart sounds and intact distal pulses.  Exam reveals no gallop and no friction rub.   No murmur heard. Pulmonary/Chest: Effort normal and breath sounds normal. No stridor. No respiratory  distress. She has no wheezes. She has no rales. She exhibits no tenderness.  Abdominal: Soft. Bowel sounds are normal. She exhibits no distension and no mass. There is no tenderness. There is no rebound and no guarding.  Musculoskeletal: Normal range of motion. She exhibits no edema and no tenderness.  Lymphadenopathy:    She has no cervical adenopathy.  Neurological: She is oriented to person, place, and time.  Skin: Skin is warm and dry. No rash noted. She is not diaphoretic. No erythema. No pallor.  Psychiatric: She has a normal mood and affect. Her behavior is normal. Judgment and thought content normal.     Lab Results  Component Value Date   WBC 5.4 01/09/2012   HGB 11.3* 01/09/2012   HCT 36.5 01/09/2012   PLT 245.0 01/09/2012   GLUCOSE 93 12/25/2011   CHOL 118 12/25/2011   TRIG 82.0 12/25/2011   HDL 43.40 12/25/2011   LDLCALC 58 12/25/2011   ALT 17 12/25/2011   AST 18 12/25/2011   NA 136 12/25/2011   K 3.8 12/25/2011   CL 102 12/25/2011   CREATININE 0.7 12/25/2011   BUN 10 12/25/2011   CO2 27 12/25/2011   TSH 1.02 01/09/2012       Assessment & Plan:

## 2012-05-14 NOTE — Assessment & Plan Note (Signed)
CBC today.  

## 2012-05-14 NOTE — Assessment & Plan Note (Signed)
This is caused by the HCTZ, I have asked her to start K+ replacement therapy

## 2012-05-14 NOTE — Assessment & Plan Note (Signed)
She is doing well and has lost about 7 lbs, will continue phentermine for now

## 2012-05-14 NOTE — Assessment & Plan Note (Signed)
Her BP is well controlled, I will check her lytes and renal function today 

## 2012-05-20 ENCOUNTER — Ambulatory Visit: Payer: Federal, State, Local not specified - PPO

## 2012-05-20 ENCOUNTER — Ambulatory Visit (INDEPENDENT_AMBULATORY_CARE_PROVIDER_SITE_OTHER): Payer: Federal, State, Local not specified - PPO

## 2012-05-20 DIAGNOSIS — D649 Anemia, unspecified: Secondary | ICD-10-CM

## 2012-05-20 MED ORDER — CYANOCOBALAMIN 1000 MCG/ML IJ SOLN
1000.0000 ug | Freq: Once | INTRAMUSCULAR | Status: AC
  Administered 2012-05-20: 1000 ug via INTRAMUSCULAR

## 2012-05-28 ENCOUNTER — Encounter (HOSPITAL_COMMUNITY): Payer: Self-pay | Admitting: Emergency Medicine

## 2012-05-28 ENCOUNTER — Emergency Department (HOSPITAL_COMMUNITY)
Admission: EM | Admit: 2012-05-28 | Discharge: 2012-05-28 | Disposition: A | Discharge status: Home / Self Care | Payer: Federal, State, Local not specified - PPO | Source: Home / Self Care | Attending: Emergency Medicine | Admitting: Emergency Medicine

## 2012-05-28 ENCOUNTER — Telehealth: Payer: Self-pay | Admitting: Internal Medicine

## 2012-05-28 DIAGNOSIS — D259 Leiomyoma of uterus, unspecified: Secondary | ICD-10-CM

## 2012-05-28 DIAGNOSIS — K649 Unspecified hemorrhoids: Secondary | ICD-10-CM

## 2012-05-28 DIAGNOSIS — R1013 Epigastric pain: Secondary | ICD-10-CM

## 2012-05-28 DIAGNOSIS — B9689 Other specified bacterial agents as the cause of diseases classified elsewhere: Secondary | ICD-10-CM

## 2012-05-28 DIAGNOSIS — A499 Bacterial infection, unspecified: Secondary | ICD-10-CM

## 2012-05-28 DIAGNOSIS — N760 Acute vaginitis: Secondary | ICD-10-CM

## 2012-05-28 HISTORY — DX: Essential (primary) hypertension: I10

## 2012-05-28 LAB — POCT URINALYSIS DIP (DEVICE)
Bilirubin Urine: NEGATIVE
Glucose, UA: NEGATIVE mg/dL
Ketones, ur: NEGATIVE mg/dL
Nitrite: NEGATIVE
pH: 7 (ref 5.0–8.0)

## 2012-05-28 LAB — WET PREP, GENITAL: Yeast Wet Prep HPF POC: NONE SEEN

## 2012-05-28 MED ORDER — SUCRALFATE 1 G PO TABS: 1.0000 g | ORAL_TABLET | Freq: Three times a day (TID) | ORAL | Status: DC

## 2012-05-28 MED ORDER — HYDROCORTISONE 2.5 % RE CREA: TOPICAL_CREAM | Freq: Two times a day (BID) | RECTAL | Status: DC

## 2012-05-28 MED ORDER — OMEPRAZOLE 20 MG PO CPDR: 20.0000 mg | DELAYED_RELEASE_CAPSULE | Freq: Two times a day (BID) | ORAL | Status: DC

## 2012-05-28 MED ORDER — METRONIDAZOLE 500 MG PO TABS: 500.0000 mg | ORAL_TABLET | Freq: Two times a day (BID) | ORAL | Status: DC

## 2012-05-28 NOTE — ED Provider Notes (Signed)
Chief Complaint  Patient presents with  . Abdominal Pain    History of Present Illness:    The patient is a 46 year old female who presents today with a five-day history of epigastric and left lower quadrant pain. These appear to be 2 separate pains. The epigastric pain feels like an ache or soreness and is rated an 8/10 in intensity. The pain comes and goes. It's worse with pressure or she eats or drinks anything including water. The pain is relieved by Tylenol. The patient notes some decrease in her appetite but thinks this is due to the phentermine that she's taking for weight loss. The epigastric pain seems to radiate through the back. She has no history of ulcer disease, gastritis, reflux esophagitis, liver disease, gallbladder disease, or pancreatitis. She is seeing Dr. Yetta Barre and is on hydrochlorothiazide for mild hypertension and also potassium for hypokalemia. She denies any fever, chills, nausea, or vomiting. Her appetite is diminished and she thinks she has lost some weight. Her stools are somewhat infrequent and she has hemorrhoids with occasional bleeding. She also has had a five-day history of left lower quadrant, pain. This is worse if she presses on the area or if she moves. It's also rated an 8/10 in intensity at times. She has had some white discharge but no odor or itching. Her periods are heavy but regular. Her last menstrual period was October 5. She is sexually active and has had a bilateral tubal ligation. She is concerned about STDs. She denies any pain with intercourse. She's had no urinary symptoms, frequency, urgency, or dysuria. She does have a history of kidney stones and had a C-section.  Review of Systems:  Other than noted above, the patient denies any of the following symptoms: Constitutional:  No fever, chills, fatigue, weight loss or anorexia. Lungs:  No cough or shortness of breath. Heart:  No chest pain, palpitations, syncope or edema.  No cardiac history. Abdomen:  No  nausea, vomiting, hematememesis, melena, diarrhea, or hematochezia. GU:  No dysuria, frequency, urgency, or hematuria. Gyn:  No vaginal discharge, itching, abnormal bleeding, dyspareunia, or pelvic pain.  PMFSH:  Past medical history, family history, social history, meds, and allergies were reviewed along with nurse's notes.  No prior abdominal surgeries, past history of GI problems, STDs or GYN problems.  No history of aspirin or NSAID use.  No excessive alcohol intake.  Physical Exam:   Vital signs:  BP 125/78  Pulse 116  Temp 98 F (36.7 C) (Oral)  Resp 21  SpO2 100%  LMP 05/11/2012 Gen:  Alert, oriented, in no distress. Lungs:  Breath sounds clear and equal bilaterally.  No wheezes, rales or rhonchi. Heart:  Regular rhythm.  No gallops or murmers.   Abdomen:  She has moderate epigastric tenderness to palpation without guarding or rebound. No organomegaly or mass. Bowel sounds are normally active. Murphy sign and Murphy's punch were negative. She has mild left lower quadrant tenderness to palpation. Pelvic:  Normal external genitalia, but she does have prolapsing internal hemorrhoids which are visible externally. These are not bleeding. Vaginal and cervical mucosa were normal. She has a white, frothy discharge which was non-malodorous. There was no pain on cervical motion. The uterus is enlarged and irregular with protrusion of to the left consistent with a fibroid tumor. This was fairly large, about the size of a baseball. It is mildly tender to palpation. She has no adnexal tenderness or mass. Skin:  Clear, warm and dry.  No rash.  Labs:  Results for orders placed during the hospital encounter of 05/28/12  POCT H PYLORI SCREEN      Component Value Range   H. PYLORI SCREEN, POC NEGATIVE  NEGATIVE  POCT URINALYSIS DIP (DEVICE)      Component Value Range   Glucose, UA NEGATIVE  NEGATIVE mg/dL   Bilirubin Urine NEGATIVE  NEGATIVE   Ketones, ur NEGATIVE  NEGATIVE mg/dL   Specific  Gravity, Urine 1.020  1.005 - 1.030   Hgb urine dipstick TRACE (*) NEGATIVE   pH 7.0  5.0 - 8.0   Protein, ur NEGATIVE  NEGATIVE mg/dL   Urobilinogen, UA 0.2  0.0 - 1.0 mg/dL   Nitrite NEGATIVE  NEGATIVE   Leukocytes, UA TRACE (*) NEGATIVE  POCT PREGNANCY, URINE      Component Value Range   Preg Test, Ur NEGATIVE  NEGATIVE    Assessment:  The primary encounter diagnosis was Epigastric pain. Diagnoses of Fibroid uterus, Bacterial vaginal infection, and Hemorrhoids were also pertinent to this visit.  Differential diagnosis for the epigastric pain would include reflux esophagitis, ulcer or gastritis. Gallstones or pancreatitis would also be possible, but less likely. I think the left lower quadrant pain is probably due to a fibroid tumor. Differential for this would include an ovarian cyst. Finally she appears to have prolapsing internal hemorrhoids and bacterial vaginosis versus Trichomonas. She is concerned about STDs and was tested for HIV, syphilis, gonorrhea, Chlamydia. These results are still pending and we'll call her back about results.  Plan:   1.  The following meds were prescribed:   New Prescriptions   HYDROCORTISONE (ANUSOL-HC) 2.5 % RECTAL CREAM    Place rectally 2 (two) times daily.   METRONIDAZOLE (FLAGYL) 500 MG TABLET    Take 1 tablet (500 mg total) by mouth 2 (two) times daily.   OMEPRAZOLE (PRILOSEC) 20 MG CAPSULE    Take 1 capsule (20 mg total) by mouth 2 (two) times daily before a meal.   SUCRALFATE (CARAFATE) 1 G TABLET    Take 1 tablet (1 g total) by mouth 4 (four) times daily -  before meals and at bedtime.   2.  The patient was instructed in symptomatic care and handouts were given. 3.  The patient was told to return if becoming worse in any way, if no better in 3 or 4 days, and given some red flag symptoms that would indicate earlier return.  Follow up:  The patient was told to follow up with Dr. Charna Elizabeth for the epigastric pain and hemorrhoids and with Dr. Reynaldo Minium for the fibroid tumor.     Reuben Likes, MD 05/28/12 [phone]

## 2012-05-28 NOTE — Telephone Encounter (Signed)
Caller: Lashaunta/Patient; Patient Name: Mary Sanford; PCP: Sanda Linger (Adults only); Best Callback Phone Number: [phone]. Reports abdominal pain. Onset 05/23/12. Afebrile. Denies diarrhea, vomiting. Last bowel movement 05/27/12. Reports pain in abdomen more to the left side. Reports pain worsens with movement or when touched. Reports pain is severe after eating. Triaged per Abdominal Pain guideline, disposition: see ED immediately for "Generalized or localized pain made worse with cough, movement, or standing up straight." Advised patient to go to [facility] ED at this time for evaluation. Care advice and call back parameters given per guideline. Patient verbalized understanding.

## 2012-05-28 NOTE — ED Notes (Signed)
Pt c/o abd pain x5 days.... Sx include: discomfort at abd when she eats, applies pressure, moves, also having lower back pain... Denies: urinary problems, vaginal discharge, fever, nauseas, diarrhea, vomiting... Pt is alert w/no signs of distress.

## 2012-05-29 LAB — GC/CHLAMYDIA PROBE AMP, GENITAL
Chlamydia, DNA Probe: NEGATIVE
GC Probe Amp, Genital: NEGATIVE

## 2012-05-29 LAB — HIV ANTIBODY (ROUTINE TESTING W REFLEX): HIV: NONREACTIVE

## 2012-05-31 NOTE — ED Notes (Signed)
Gc/Chlamydia neg., Wet prep: many clue cells, few WBC's, HIV/RPR non-reactive. Pt. adequately treated with Flagyl. Vassie Moselle 05/31/2012

## 2012-06-03 ENCOUNTER — Ambulatory Visit (INDEPENDENT_AMBULATORY_CARE_PROVIDER_SITE_OTHER): Payer: Federal, State, Local not specified - PPO | Admitting: General Practice

## 2012-06-03 DIAGNOSIS — D649 Anemia, unspecified: Secondary | ICD-10-CM

## 2012-06-03 MED ORDER — CYANOCOBALAMIN 1000 MCG/ML IJ SOLN
1000.0000 ug | Freq: Once | INTRAMUSCULAR | Status: AC
  Administered 2012-06-03: 1000 ug via INTRAMUSCULAR

## 2012-06-17 ENCOUNTER — Ambulatory Visit (INDEPENDENT_AMBULATORY_CARE_PROVIDER_SITE_OTHER): Payer: Federal, State, Local not specified - PPO

## 2012-06-17 DIAGNOSIS — E538 Deficiency of other specified B group vitamins: Secondary | ICD-10-CM

## 2012-06-17 DIAGNOSIS — D649 Anemia, unspecified: Secondary | ICD-10-CM

## 2012-06-17 MED ORDER — CYANOCOBALAMIN 1000 MCG/ML IJ SOLN
1000.0000 ug | INTRAMUSCULAR | Status: DC
  Administered 2012-06-17: 1000 ug via INTRAMUSCULAR

## 2012-06-30 ENCOUNTER — Ambulatory Visit: Payer: Federal, State, Local not specified - PPO

## 2012-08-12 ENCOUNTER — Ambulatory Visit: Payer: Federal, State, Local not specified - PPO

## 2012-08-13 ENCOUNTER — Ambulatory Visit (INDEPENDENT_AMBULATORY_CARE_PROVIDER_SITE_OTHER): Payer: Federal, State, Local not specified - PPO | Admitting: *Deleted

## 2012-08-13 DIAGNOSIS — E538 Deficiency of other specified B group vitamins: Secondary | ICD-10-CM

## 2012-08-13 MED ORDER — CYANOCOBALAMIN 1000 MCG/ML IJ SOLN
1000.0000 ug | Freq: Once | INTRAMUSCULAR | Status: AC
  Administered 2012-08-13: 1000 ug via INTRAMUSCULAR

## 2012-08-27 ENCOUNTER — Ambulatory Visit (INDEPENDENT_AMBULATORY_CARE_PROVIDER_SITE_OTHER): Payer: Federal, State, Local not specified - PPO

## 2012-08-27 DIAGNOSIS — D649 Anemia, unspecified: Secondary | ICD-10-CM

## 2012-08-27 MED ORDER — CYANOCOBALAMIN 1000 MCG/ML IJ SOLN
1000.0000 ug | Freq: Once | INTRAMUSCULAR | Status: AC
  Administered 2012-08-27: 1000 ug via INTRAMUSCULAR

## 2012-09-09 ENCOUNTER — Ambulatory Visit (INDEPENDENT_AMBULATORY_CARE_PROVIDER_SITE_OTHER): Payer: Federal, State, Local not specified - PPO

## 2012-09-09 DIAGNOSIS — D649 Anemia, unspecified: Secondary | ICD-10-CM

## 2012-09-09 MED ORDER — CYANOCOBALAMIN 1000 MCG/ML IJ SOLN
1000.0000 ug | Freq: Once | INTRAMUSCULAR | Status: AC
  Administered 2012-09-09: 1000 ug via INTRAMUSCULAR

## 2012-09-20 ENCOUNTER — Ambulatory Visit (INDEPENDENT_AMBULATORY_CARE_PROVIDER_SITE_OTHER): Payer: Federal, State, Local not specified - PPO | Admitting: Internal Medicine

## 2012-09-20 ENCOUNTER — Other Ambulatory Visit (INDEPENDENT_AMBULATORY_CARE_PROVIDER_SITE_OTHER): Payer: Federal, State, Local not specified - PPO

## 2012-09-20 ENCOUNTER — Encounter: Payer: Self-pay | Admitting: Internal Medicine

## 2012-09-20 VITALS — BP 128/82 | HR 82 | Temp 97.3°F | Ht 69.0 in | Wt 199.4 lb

## 2012-09-20 DIAGNOSIS — D649 Anemia, unspecified: Secondary | ICD-10-CM

## 2012-09-20 DIAGNOSIS — R209 Unspecified disturbances of skin sensation: Secondary | ICD-10-CM

## 2012-09-20 DIAGNOSIS — R200 Anesthesia of skin: Secondary | ICD-10-CM

## 2012-09-20 DIAGNOSIS — R202 Paresthesia of skin: Secondary | ICD-10-CM

## 2012-09-20 LAB — HEMOGLOBIN A1C: Hgb A1c MFr Bld: 6 % (ref 4.6–6.5)

## 2012-09-20 MED ORDER — PREDNISONE 10 MG PO TABS: ORAL_TABLET | ORAL | Status: DC

## 2012-09-20 MED ORDER — CYANOCOBALAMIN 1000 MCG/ML IJ SOLN
1000.0000 ug | Freq: Once | INTRAMUSCULAR | Status: AC
  Administered 2012-09-20: 1000 ug via INTRAMUSCULAR

## 2012-09-20 NOTE — Patient Instructions (Signed)

## 2012-09-20 NOTE — Progress Notes (Signed)
Subjective:    Patient ID: Mary Sanford, female    DOB: 07-24-1966, 47 y.o.   MRN: [id number]  HPI  Pt presents to the clinic today with c/o left arm pain, numbness tingling and itching x 3 days. She has never had these abnormal sensations in her arm before. She denies any injury to the arm. She does not sleep on her left side. She does not have a rash on that arm. She is concerned that she may have diabetes because it runs in her family. She does occasionally have some numbness and tingling in her feet as well.  Review of Systems      Past Medical History  Diagnosis Date  . Kidney stones   . Anemia   . Hypertension     Current Outpatient Prescriptions  Medication Sig Dispense Refill  . acetaminophen (TYLENOL) 325 MG tablet Take 650 mg by mouth as needed.      . Cyanocobalamin 500 MCG/0.1ML SOLN Place 0.1 mLs (500 mcg total) into the nose once a week.  2.3 mL  11  . hydrochlorothiazide (HYDRODIURIL) 25 MG tablet Take 1 tablet (25 mg total) by mouth daily.  30 tablet  11  . phentermine 37.5 MG capsule Take 37.5 mg by mouth every morning.      . potassium chloride (K-DUR) 10 MEQ tablet Take 1 tablet (10 mEq total) by mouth 2 (two) times daily.  60 tablet  11  . omeprazole (PRILOSEC) 20 MG capsule Take 1 capsule (20 mg total) by mouth 2 (two) times daily before a meal.  60 capsule  0  . phentermine 37.5 MG capsule Take 1 capsule (37.5 mg total) by mouth every morning.  30 capsule  2  . predniSONE (DELTASONE) 10 MG tablet Take 3 tablets on days 1-3, take 2 tablets on days 4-6, take 1 tablet on days 7-9  18 tablet  0   No current facility-administered medications for this visit.    No Known Allergies  Family History  Problem Relation Age of Onset  . Asthma    . Hypothyroidism      History   Social History  . Marital Status: Legally Separated    Spouse Name: N/A    Number of Children: N/A  . Years of Education: N/A   Occupational History  . minister    Social History  Main Topics  . Smoking status: Former Games developer  . Smokeless tobacco: Not on file  . Alcohol Use: No  . Drug Use: No  . Sexually Active: Not Currently    Birth Control/ Protection: Surgical   Other Topics Concern  . Not on file   Social History Narrative  . No narrative on file     Constitutional: Denies fever, malaise, fatigue, headache or abrupt weight changes.  Respiratory: Denies difficulty breathing, shortness of breath, cough or sputum production.   Cardiovascular: Denies chest pain, chest tightness, palpitations or swelling in the hands or feet.  Musculoskeletal: Pt reports left arm pain. Denies decrease in range of motion, difficulty with gait, muscle pain or joint pain and swelling.  Skin: Denies redness, rashes, lesions or ulcercations.  Neurological: Pt reports left arm numbness and tingling. Denies dizziness, difficulty with memory, difficulty with speech or problems with balance and coordination.   No other specific complaints in a complete review of systems (except as listed in HPI above).  Objective:   Physical Exam   BP 128/82  Pulse 82  Temp(Src) 97.3 F (36.3 C) (Oral)  Ht  5\' 9"  (1.753 m)  Wt 199 lb 6.4 oz (90.447 kg)  BMI 29.43 kg/m2  SpO2 99%  LMP 09/11/2012 Wt Readings from Last 3 Encounters:  09/20/12 199 lb 6.4 oz (90.447 kg)  05/13/12 196 lb (88.905 kg)  02/09/12 203 lb (92.08 kg)    General: Appears her stated age, well developed, well nourished in NAD. Skin: Warm, dry and intact. No rashes, lesions or ulcerations noted.  Cardiovascular: Normal rate and rhythm. S1,S2 noted.  No murmur, rubs or gallops noted. No JVD or BLE edema. No carotid bruits noted. Pulmonary/Chest: Normal effort and positive vesicular breath sounds. No respiratory distress. No wheezes, rales or ronchi noted.  Musculoskeletal: Normal range of motion. No signs of joint swelling. No difficulty with gait.  Neurological: Alert and oriented. Cranial nerves II-XII intact.  Coordination normal. +DTRs bilaterally. Sensation intact to dull and sharp touch.       Assessment & Plan:   Numbness and tingling of the left arm, new onset with additional workup required:  Will check HgBA1C to rule out diabetes eRx for steroid taper  Will f/u after steroid taper finished to see if additional workup reuquired

## 2012-09-28 ENCOUNTER — Telehealth: Payer: Self-pay | Admitting: Internal Medicine

## 2012-09-28 ENCOUNTER — Other Ambulatory Visit: Payer: Self-pay | Admitting: Internal Medicine

## 2012-09-28 NOTE — Telephone Encounter (Signed)
Patient Information:  Caller Name: Savaya  Phone: [phone]  Patient: Mary Sanford, Mary Sanford  Gender: Female  DOB: [birth year]  Age: 47 Years  PCP: Sanda Linger (Adults only)  Pregnant: No  Office Follow Up:  Does the office need to follow up with this patient?: Yes  Instructions For The Office: was going to schedule an appt for 09/28/12 with Dr.Jones, but Shamera has an injection scheduled for tomorrow at 0900 and would like to do both at the same time; please call back and let pt know when she can schedule both   Symptoms  Reason For Call & Symptoms: c/o dizziness off and on for awhile, but worsened over the w/e; is concerned today because she took Hctz, Potassium and Prednisone at the same time and wonders if that could be bad for her;  Reviewed Health History In EMR: Yes  Reviewed Medications In EMR: Yes  Reviewed Allergies In EMR: Yes  Reviewed Surgeries / Procedures: Yes  Date of Onset of Symptoms: Unknown OB / GYN:  LMP: Unknown  Guideline(s) Used:  Dizziness  Disposition Per Guideline:   Go to Office Now  Reason For Disposition Reached:   Spinning or tilting sensation (vertigo) present now  Advice Given:  Temporary Dizziness  is usually a harmless symptom. It can be caused by not drinking enough water during sports or hot weather. It can also be caused by skipping a meal, too much sun exposure, standing up suddenly, standing too long in one place or even a viral illness.  Call Back If:  You become worse.  RN Overrode Recommendation:  Follow Up With Office Later  Offered appt today, but Rene Kocher requested an appt tomorrow

## 2012-09-29 ENCOUNTER — Ambulatory Visit (INDEPENDENT_AMBULATORY_CARE_PROVIDER_SITE_OTHER): Payer: Federal, State, Local not specified - PPO

## 2012-09-29 DIAGNOSIS — D649 Anemia, unspecified: Secondary | ICD-10-CM

## 2012-09-29 MED ORDER — CYANOCOBALAMIN 1000 MCG/ML IJ SOLN
1000.0000 ug | Freq: Once | INTRAMUSCULAR | Status: AC
  Administered 2012-09-29: 1000 ug via INTRAMUSCULAR

## 2012-09-30 ENCOUNTER — Other Ambulatory Visit (INDEPENDENT_AMBULATORY_CARE_PROVIDER_SITE_OTHER): Payer: Federal, State, Local not specified - PPO

## 2012-09-30 ENCOUNTER — Encounter: Payer: Self-pay | Admitting: Internal Medicine

## 2012-09-30 ENCOUNTER — Ambulatory Visit (INDEPENDENT_AMBULATORY_CARE_PROVIDER_SITE_OTHER): Payer: Federal, State, Local not specified - PPO | Admitting: Internal Medicine

## 2012-09-30 VITALS — BP 116/84 | HR 80 | Temp 98.3°F | Resp 16 | Wt 187.0 lb

## 2012-09-30 DIAGNOSIS — R202 Paresthesia of skin: Secondary | ICD-10-CM

## 2012-09-30 DIAGNOSIS — D649 Anemia, unspecified: Secondary | ICD-10-CM

## 2012-09-30 DIAGNOSIS — I10 Essential (primary) hypertension: Secondary | ICD-10-CM

## 2012-09-30 DIAGNOSIS — K645 Perianal venous thrombosis: Secondary | ICD-10-CM

## 2012-09-30 DIAGNOSIS — R209 Unspecified disturbances of skin sensation: Secondary | ICD-10-CM

## 2012-09-30 DIAGNOSIS — E876 Hypokalemia: Secondary | ICD-10-CM

## 2012-09-30 DIAGNOSIS — K648 Other hemorrhoids: Secondary | ICD-10-CM | POA: Insufficient documentation

## 2012-09-30 DIAGNOSIS — E669 Obesity, unspecified: Secondary | ICD-10-CM

## 2012-09-30 LAB — CBC WITH DIFFERENTIAL/PLATELET
Basophils Absolute: 0.2 10*3/uL — ABNORMAL HIGH (ref 0.0–0.1)
Eosinophils Relative: 1 % (ref 0.0–5.0)
HCT: 38 % (ref 36.0–46.0)
Lymphocytes Relative: 33.9 % (ref 12.0–46.0)
Lymphs Abs: 4 10*3/uL (ref 0.7–4.0)
Monocytes Relative: 11.4 % (ref 3.0–12.0)
Platelets: 325 10*3/uL (ref 150.0–400.0)
RDW: 16.2 % — ABNORMAL HIGH (ref 11.5–14.6)
WBC: 11.9 10*3/uL — ABNORMAL HIGH (ref 4.5–10.5)

## 2012-09-30 LAB — COMPREHENSIVE METABOLIC PANEL
ALT: 19 U/L (ref 0–35)
Albumin: 3.6 g/dL (ref 3.5–5.2)
CO2: 27 mEq/L (ref 19–32)
Calcium: 9.9 mg/dL (ref 8.4–10.5)
Chloride: 104 mEq/L (ref 96–112)
GFR: 86.92 mL/min (ref >60.00)
Glucose, Bld: 84 mg/dL (ref 70–99)
Potassium: 3.8 mEq/L (ref 3.5–5.1)
Sodium: 137 mEq/L (ref 135–145)
Total Bilirubin: 0.6 mg/dL (ref 0.3–1.2)
Total Protein: 7 g/dL (ref 6.0–8.3)

## 2012-09-30 LAB — IBC PANEL: Iron: 112 ug/dL (ref 42–145)

## 2012-09-30 LAB — FOLATE: Folate: 16.4 ng/mL (ref >5.9)

## 2012-09-30 LAB — TSH: TSH: 0.71 u[IU]/mL (ref 0.35–5.50)

## 2012-09-30 MED ORDER — PHENTERMINE HCL 37.5 MG PO CAPS: 37.5000 mg | ORAL_CAPSULE | ORAL | Status: DC

## 2012-09-30 NOTE — Assessment & Plan Note (Signed)
Will recheck her K+ level today 

## 2012-09-30 NOTE — Patient Instructions (Signed)

## 2012-09-30 NOTE — Assessment & Plan Note (Signed)
Her BP is well controlled I will check her lytes and renal function today 

## 2012-09-30 NOTE — Progress Notes (Signed)
Subjective:    Patient ID: Mary Sanford, female    DOB: [birth year], 47 y.o.   MRN: [id number]  Hypertension This is a chronic problem. The current episode started more than 1 year ago. The problem has been gradually improving since onset. The problem is controlled. Pertinent negatives include no anxiety, blurred vision, chest pain, headaches, malaise/fatigue, neck pain, orthopnea, palpitations, peripheral edema, PND, shortness of breath or sweats. Past treatments include diuretics. The current treatment provides significant improvement. There are no compliance problems.       Review of Systems  Constitutional: Negative.  Negative for fever, chills, malaise/fatigue, diaphoresis, activity change, appetite change, fatigue and unexpected weight change.  HENT: Negative.  Negative for neck pain.   Eyes: Negative.  Negative for blurred vision.  Respiratory: Negative for shortness of breath.   Cardiovascular: Negative for chest pain, palpitations, orthopnea and PND.  Gastrointestinal: Positive for anal bleeding and rectal pain. Negative for nausea, vomiting, abdominal pain, diarrhea and constipation.  Genitourinary: Negative.   Musculoskeletal: Negative for myalgias, back pain, joint swelling and gait problem.  Skin: Negative.   Allergic/Immunologic: Negative.   Neurological: Positive for dizziness, light-headedness (on standing) and numbness (tingling and pain in her left arm). Negative for tremors, seizures, syncope, facial asymmetry, speech difficulty, weakness and headaches.  Hematological: Negative for adenopathy. Does not bruise/bleed easily.  Psychiatric/Behavioral: Negative.        Objective:   Physical Exam  Vitals reviewed. Constitutional: She is oriented to person, place, and time. She appears well-developed and well-nourished. No distress.  HENT:  Head: Normocephalic and atraumatic.  Mouth/Throat: Oropharynx is clear and moist. No oropharyngeal exudate.  Eyes: Conjunctivae  are normal. Right eye exhibits no discharge. Left eye exhibits no discharge. No scleral icterus.  Neck: Normal range of motion. Neck supple. No JVD present. No tracheal deviation present. No thyromegaly present.  Cardiovascular: Normal rate, regular rhythm, normal heart sounds and intact distal pulses.  Exam reveals no gallop and no friction rub.   No murmur heard. Pulmonary/Chest: Effort normal and breath sounds normal. No stridor. No respiratory distress. She has no wheezes. She has no rales. She exhibits no tenderness.  Abdominal: Soft. Bowel sounds are normal. She exhibits no distension and no mass. There is no tenderness. There is no rebound and no guarding.  Genitourinary: Rectal exam shows external hemorrhoid (small, thrombosed, anterior). Rectal exam shows no internal hemorrhoid, no fissure, no mass, no tenderness and anal tone normal. Guaiac negative stool.  Musculoskeletal: Normal range of motion. She exhibits no edema and no tenderness.       Left forearm: She exhibits tenderness (Tinel's is +). She exhibits no bony tenderness, no swelling, no edema, no deformity and no laceration.  Lymphadenopathy:    She has no cervical adenopathy.  Neurological: She is alert and oriented to person, place, and time. She has normal strength. She displays no atrophy, no tremor and normal reflexes. No cranial nerve deficit or sensory deficit. She exhibits normal muscle tone. She displays a negative Romberg sign. She displays no seizure activity. Coordination and gait normal.  Reflex Scores:      Tricep reflexes are 1+ on the right side and 1+ on the left side.      Bicep reflexes are 1+ on the right side and 1+ on the left side.      Brachioradialis reflexes are 1+ on the right side and 1+ on the left side.      Patellar reflexes are 1+ on the right side and  1+ on the left side.      Achilles reflexes are 1+ on the right side and 1+ on the left side. Skin: Skin is warm and dry. No rash noted. She is not  diaphoretic. No erythema. No pallor.  Psychiatric: She has a normal mood and affect. Her behavior is normal. Judgment and thought content normal.      Lab Results  Component Value Date   WBC 6.5 05/13/2012   HGB 11.3* 05/13/2012   HCT 36.2 05/13/2012   PLT 285.0 05/13/2012   GLUCOSE 122* 05/13/2012   CHOL 118 12/25/2011   TRIG 82.0 12/25/2011   HDL 43.40 12/25/2011   LDLCALC 58 12/25/2011   ALT 16 05/13/2012   AST 17 05/13/2012   NA 137 05/13/2012   K 3.1* 05/13/2012   CL 102 05/13/2012   CREATININE 0.8 05/13/2012   BUN 8 05/13/2012   CO2 30 05/13/2012   TSH 1.02 01/09/2012   HGBA1C 6.0 09/20/2012      Assessment & Plan:

## 2012-09-30 NOTE — Assessment & Plan Note (Signed)
I am not sure what is causing this - will get a [facility]/EMG to see is she has a cervical radicular problem, CTS, nerve impingement, etc.

## 2012-09-30 NOTE — Assessment & Plan Note (Signed)
General surgery referral 

## 2012-09-30 NOTE — Assessment & Plan Note (Signed)
I will recheck her CBC and will look at her vitamin levels

## 2012-09-30 NOTE — Assessment & Plan Note (Signed)
Continue phentermine

## 2012-10-02 ENCOUNTER — Encounter: Payer: Self-pay | Admitting: Internal Medicine

## 2012-10-04 ENCOUNTER — Encounter (INDEPENDENT_AMBULATORY_CARE_PROVIDER_SITE_OTHER): Payer: Self-pay

## 2012-10-04 ENCOUNTER — Encounter (INDEPENDENT_AMBULATORY_CARE_PROVIDER_SITE_OTHER): Payer: Self-pay | Admitting: Surgery

## 2012-10-04 ENCOUNTER — Ambulatory Visit (INDEPENDENT_AMBULATORY_CARE_PROVIDER_SITE_OTHER): Payer: Federal, State, Local not specified - PPO | Admitting: Surgery

## 2012-10-04 VITALS — BP 120/78 | HR 115 | Temp 97.0°F | Resp 18 | Ht 69.0 in | Wt 199.4 lb

## 2012-10-04 DIAGNOSIS — K5900 Constipation, unspecified: Secondary | ICD-10-CM

## 2012-10-04 DIAGNOSIS — K5909 Other constipation: Secondary | ICD-10-CM | POA: Insufficient documentation

## 2012-10-04 DIAGNOSIS — K645 Perianal venous thrombosis: Secondary | ICD-10-CM

## 2012-10-04 NOTE — Patient Instructions (Signed)
See the Handout(s) we gave you.  Consider surgery.  Please call our office at [phone] if you wish to schedule surgery or if you have further questions / concerns.   HEMORRHOIDS  The rectum is the last foot of your colon, and it naturally stretches to hold stool.  Hemorrhoidal piles are natural clusters of blood vessels that help the rectum and anal canal stretch to hold stool and allow bowel movements to eliminate feces.   Hemorrhoids are abnormally swollen blood vessels in the rectum.  Too much pressure in the rectum causes hemorrhoids by forcing blood to stretch and bulge the walls of the veins, sometimes even rupturing them.  Hemorrhoids can become like varicose veins you might see on a person's legs.  Most people will develop a flare of hemorrhoids in their lifetime.  When bulging hemorrhoidal veins are irritated, they can swell, burn, itch, cause pain, and bleed.  Most flares will calm down gradually own within a few weeks.  However, once hemorrhoids are created, they are difficult to get rid of completely and tend to flare more easily than the first flare.   Fortunately, good habits and simple medical treatment usually control hemorrhoids well, and surgery is needed only in severe cases. Types of Hemorrhoids:  Internal hemorrhoids usually don't initially hurt or itch; they are deep inside the rectum and usually have no sensation. If they begin to push out (prolapse), pain and burning can occur.  However, internal hemorrhoids can bleed.  Anal bleeding should not be ignored since bleeding could come from a dangerous source like colorectal cancer, so persistent rectal bleeding should be investigated by a doctor, sometimes with a colonoscopy.  External hemorrhoids cause most of the symptoms - pain, burning, and itching. Nonirritated hemorrhoids can look like small skin tags coming out of the anus.   Thrombosed hemorrhoids can form when a hemorrhoid blood vessel bursts and causes the hemorrhoid to  suddenly swell.  A purple blood clot can form in it and become an excruciatingly painful lump at the anus. Because of these unpleasant symptoms, immediate incision and drainage by a surgeon at an office visit can provide much relief of the pain.    PREVENTION Avoiding the most frequent causes listed below will prevent most cases of hemorrhoids: Constipation Hard stools Diarrhea  Constant sitting  Straining with bowel movements Sitting on the toilet for a long time  Severe coughing  episodes Pregnancy / Childbirth  Heavy Lifting  Sometimes avoiding the above triggers is difficult:  How can you avoid sitting all day if you have a seated job? Also, we try to avoid coughing and diarrhea, but sometimes it's beyond your control.  Still, there are some practical hints to help: Keep the anal and genital area clean.  Moistened tissues such as flushable wet wipes are less irritating than toilet paper.  Using irrigating showers or bottle irrigation washing gently cleans this sensitive area.   Avoid dry toilet paper when cleaning after bowel movements.  Marland Kitchen Keep the anal and genital area dry.  Lightly pat the rectal area dry.  Avoid rubbing.  Talcum or baby powders can help GET YOUR STOOLS SOFT.   This is the most important way to prevent irritated hemorrhoids.  Hard stools are like sandpaper to the anorectal canal and will cause more problems.  The goal: ONE SOFT BOWEL MOVEMENT A DAY!  BMs from every other day to 3 times a day is a tolerable range Treat coughing, diarrhea and constipation early since irritated  hemorrhoids may soon follow.  If your main job activity is seated, always stand or walk during your breaks. Make it a point to stand and walk at least 5 minutes every hour and try to shift frequently in your chair to avoid direct rectal pressure.  Always exhale as you strain or lift. Don't hold your breath.  Do not delay or try to prevent a bowel movement when the urge is present. Exercise regularly  (walking or jogging 60 minutes a day) to stimulate the bowels to move. No reading or other activity while on the toilet. If bowel movements take longer than 5 minutes, you are too constipated. AVOID CONSTIPATION Drink plenty of liquids (1 1/2 to 2 quarts of water and other fluids a day unless fluid restricted for another medical condition). Liquids that contain caffeine (coffee a, tea, soft drinks) can be dehydrating and should be avoided until constipation is controlled. Consider minimizing milk, as dairy products may be constipating. Eat plenty of fiber (30g a day ideal, more if needed).  Fiber is the undigested part of plant food that passes into the colon, acting as "natures broom" to encourage bowel motility and movement.  Fiber can absorb and hold large amounts of water. This results in a larger, bulkier stool, which is soft and easier to pass.  Eating foods high in fiber - 12 servings - such as  Vegetables: Root (potatoes, carrots, turnips), Leafy green (lettuce, salad greens, celery, spinach), High residue (cabbage, broccoli, etc.) Fruit: Fresh, Dried (prunes, apricots, cherries), Stewed (applesauce)  Whole grain breads, pasta, whole wheat Bran cereals, muffins, etc. Consider adding supplemental bulking fiber which retains large volumes of water: Psyllium ground seeds --available as Metamucil, Konsyl, Effersyllium, Per Diem Fiber, or the less expensive generic forms.  Citrucel  (methylcellulose wood fiber) . FiberCon (Polycarbophil) Polyethylene Glycol - and "artificial" fiber commonly called Miralax or Glycolax.  It is helpful for people with gassy or bloated feelings with regular fiber Flax Seed - a less gassy natural fiber  Laxatives can be useful for a short period if constipation is severe Osmotics (Milk of Magnesia, Fleets Phospho-Soda, Magnesium Citrate)  Stimulants (Senokot,   Castor Oil,  Dulcolax, Ex-Lax)    Laxatives are not a good long-term solution as it can stress the bowels  and cause too much mineral loss and dehydration.   Avoid taking laxatives for more than 7 days in a row.  AVOID DIARRHEA Switch to liquids and simpler foods for a few days to avoid stressing your intestines further. Avoid dairy products (especially milk & ice cream) for a short time.  The intestines often can lose the ability to digest lactose when stressed. Avoid foods that cause gassiness or bloating.  Typical foods include beans and other legumes, cabbage, broccoli, and dairy foods.  Every person has some sensitivity to other foods, so listen to your body and avoid those foods that trigger problems for you. Adding fiber (Citrucel, Metamucil, FiberCon, Flax seed, Miralax) gradually can help thicken stools by absorbing excess fluid and retrain the intestines to act more normally.  Slowly increase the dose over a few weeks.  Too much fiber too soon can backfire and cause cramping & bloating. Probiotics (such as active yogurt, Align, etc) may help repopulate the intestines and colon with normal bacteria and calm down a sensitive digestive tract.  Most studies show it to be of mild help, though, and such products can be costly. Medicines: Bismuth subsalicylate (ex. Kayopectate, Pepto Bismol) every 30 minutes for up to  6 doses can help control diarrhea.  Avoid if pregnant. Loperamide (Immodium) can slow down diarrhea.  Start with two tablets (4mg  total) first and then try one tablet every 6 hours.  Avoid if you are having fevers or severe pain.  If you are not better or start feeling worse, stop all medicines and call your doctor for advice Call your doctor if you are getting worse or not better.  Sometimes further testing (cultures, endoscopy, X-ray studies, bloodwork, etc) may be needed to help diagnose and treat the cause of the diarrhea. TREATMENT OF HEMORRHOID FLARE If these preventive measures fail, you must take action right away! Hemorrhoids are one condition that can be mild in the morning and  become intolerable by nightfall. Most hemorrhoidal flares take several weeks to calm down.  These suggestions can help: Warm soaks.  This helps more than any topical medication.  Use up to 8 times a day.  Usually sitz baths or sitting in a warm bathtub helps.  Sitting on moist warm towels are helpful.  Switching to ice packs/cool compresses can be helpful Normalize your bowels.  Extremes of diarrhea or constipation will make hemorrhoids worse.  One soft bowel movement a day is the goal.  Fiber can help get your bowels regular Wet wipes instead of toilet paper Pain control with a NSAID such as ibuprofen (Advil) or naproxen (Aleve) or acetaminophen (Tylenol) around the clock.  Narcotics are constipating and should be minimized if possible Topical creams contain steroids (bydrocortisone) or local anesthetic (xylocaine) can help make pain and itching more tolerable.   EVALUATION If hemorrhoids are still causing problems, you could benefit by an evaluation by a surgeon.  The surgeon will obtain a history and examine you.  If hemorrhoids are diagnosed, some therapies can be offered in the office, usually with an anoscope into the less sensitive area of the rectum: -injection of hemorrhoids (sclerotherapy) can scar the blood vessels of the swollen/enlarged hemorrhoids to help shrink them down to a more normal size -rubber banding of the enlarged hemorrhoids to help shrink them down to a more normal size -drainage of the blood clot causing a thrombosed hemorrhoid,  to relieve the severe pain   While 90% of the time such problems from hemorrhoids can be managed without preceding to surgery, sometimes the hemorrhoids require a operation to control the problem (uncontrolled bleeding, prolapse, pain, etc.).   This involves being placed under general anesthesia where the surgeon can confirm the diagnosis and remove, suture, or staple the hemorrhoid(s).  Your surgeon can help you treat the problem appropriately.     ANORECTAL SURGERY: POST OP INSTRUCTIONS  1. Take your usually prescribed home medications unless otherwise directed. 2. DIET: Follow a light bland diet the first 24 hours after arrival home, such as soup, liquids, crackers, etc.  Be sure to include lots of fluids daily.  Avoid fast food or heavy meals as your are more likely to get nauseated.  Eat a low fat the next few days after surgery.   3. PAIN CONTROL: a. Pain is best controlled by a usual combination of three different methods TOGETHER: i. Ice/Heat ii. Over the counter pain medication iii. Prescription pain medication b. Most patients will experience some swelling and discomfort in the anus/rectal area. and incisions.  Ice packs or heat (30-60 minutes up to 6 times a day) will help. Use ice for the first few days to help decrease swelling and bruising, then switch to heat such as warm towels, sitz  baths, warm baths, etc to help relax tight/sore spots and speed recovery.  Some people prefer to use ice alone, heat alone, alternating between ice & heat.  Experiment to what works for you.  Swelling and bruising can take several weeks to resolve.   c. It is helpful to take an over-the-counter pain medication regularly for the first few weeks.  Choose one of the following that works best for you: i. Naproxen (Aleve, etc)  Two 220mg  tabs twice a day ii. Ibuprofen (Advil, etc) Three 200mg  tabs four times a day (every meal & bedtime) iii. Acetaminophen (Tylenol, etc) 500-650mg  four times a day (every meal & bedtime) d. A  prescription for pain medication (such as oxycodone, hydrocodone, etc) should be given to you upon discharge.  Take your pain medication as prescribed.  i. If you are having problems/concerns with the prescription medicine (does not control pain, nausea, vomiting, rash, itching, etc), please call us [phone] to see if we need to switch you to a different pain medicine that will work better for you and/or control your side  effect better. ii. If you need a refill on your pain medication, please contact your pharmacy.  They will contact our office to request authorization. Prescriptions will not be filled after 5 pm or on week-ends. 4. KEEP YOUR BOWELS REGULAR a. The goal is one bowel movement a day b. Avoid getting constipated.  Between the surgery and the pain medications, it is common to experience some constipation.  Increasing fluid intake and taking a fiber supplement (such as Metamucil, Citrucel, FiberCon, MiraLax, etc) 1-2 times a day regularly will usually help prevent this problem from occurring.  A mild laxative (prune juice, Milk of Magnesia, MiraLax, etc) should be taken according to package directions if there are no bowel movements after 48 hours. c. Watch out for diarrhea.  If you have many loose bowel movements, simplify your diet to bland foods & liquids for a few days.  Stop any stool softeners and decrease your fiber supplement.  Switching to mild anti-diarrheal medications (Kayopectate, Pepto Bismol) can help.  If this worsens or does not improve, please call us.  5. Wound Care a. Remove your bandages the day after surgery.  Unless discharge instructions indicate otherwise, leave your bandage dry and in place overnight.  Remove the bandage during your first bowel movement.   b. Allow the wound packing to fall out over the next few days.  You can trim exposed gauze / ribbon as it falls out.  You do not need to repack the wound unless instructed otherwise.  Wear an absorbent pad or soft cotton gauze in your underwear as needed to catch any drainage and help keep the area  c. Keep the area clean and dry.  Bathe / shower every day.  Keep the area clean by showering / bathing over the incision / wound.   It is okay to soak an open wound to help wash it.  Wet wipes or showers / gentle washing after bowel movements is often less traumatic than regular toilet paper. d. Bonita Quin may have some styrofoam-like soft  packing in the rectum which will come out with the first bowel movement.  e. You will often notice bleeding with bowel movements.  This should slow down by the end of the first week of surgery f. Expect some drainage.  This should slow down, too, by the end of the first week of surgery.  Wear an absorbent pad or soft cotton gauze  in your underwear until the drainage stops. 6. ACTIVITIES as tolerated:   a. You may resume regular (light) daily activities beginning the next day-such as daily self-care, walking, climbing stairs-gradually increasing activities as tolerated.  If you can walk 30 minutes without difficulty, it is safe to try more intense activity such as jogging, treadmill, bicycling, low-impact aerobics, swimming, etc. b. Save the most intensive and strenuous activity for last such as sit-ups, heavy lifting, contact sports, etc  Refrain from any heavy lifting or straining until you are off narcotics for pain control.   c. DO NOT PUSH THROUGH PAIN.  Let pain be your guide: If it hurts to do something, don't do it.  Pain is your body warning you to avoid that activity for another week until the pain goes down. d. You may drive when you are no longer taking prescription pain medication, you can comfortably sit for long periods of time, and you can safely maneuver your car and apply brakes. e. Bonita Quin may have sexual intercourse when it is comfortable.  7. FOLLOW UP in our office a. Please call [facility] at [phone] to set up an appointment to see your surgeon in the office for a follow-up appointment approximately 2 weeks after your surgery. b. Make sure that you call for this appointment the day you arrive home to insure a convenient appointment time. 10. IF YOU HAVE DISABILITY OR FAMILY LEAVE FORMS, BRING THEM TO THE OFFICE FOR PROCESSING.  DO NOT GIVE THEM TO YOUR DOCTOR.        WHEN TO CALL us [phone]: 1. Poor pain control 2. Reactions / problems with new medications  (rash/itching, nausea, etc)  3. Fever over 101.5 F (38.5 C) 4. Inability to urinate 5. Nausea and/or vomiting 6. Worsening swelling or bruising 7. Continued bleeding from incision. 8. Increased pain, redness, or drainage from the incision  The clinic staff is available to answer your questions during regular business hours (8:30am-5pm).  Please don't hesitate to call and ask to speak to one of our nurses for clinical concerns.   A surgeon from [facility] is always on call at the hospitals   If you have a medical emergency, go to the nearest emergency room or call 911.    [facility] [address], [address], [place], [place]  [address] ? MAIN: [phone] ? TOLL FREE: [phone] ? FAX [phone] [email or web address]

## 2012-10-04 NOTE — Progress Notes (Signed)
Subjective:     Patient ID: Mary Sanford, female   DOB: 01-24-1966, 47 y.o.   MRN: [id number]  HPI  Mary Sanford  10-12-1965 [id number]  Patient Care Team: Etta Grandchild, MD as PCP - General (Internal Medicine)  This patient is a 47 y.o.female who presents today for surgical evaluation at the request of Dr. Yetta Barre.  Reason for visit: Hemorrhoids with bleeding and drainage.  Pleasant obese female.  Has had hemorrhoid problems since 1980s.  Now has hemorrhoid out all the time.  He gets bleeding with bowel movements.  Has some yellow mucousy drainage as well.  Uncomfortable.  She has some constipation but usually has a bowel movement a couple times a week to once a day.  Never had a colonoscopy.  No personal nor family history of GI/colon cancer, inflammatory bowel disease, irritable bowel syndrome, allergy such as Celiac Sprue, dietary/dairy problems, colitis, ulcers nor gastritis.  No recent sick contacts/gastroenteritis.  No travel outside the country.  No changes in diet.  She mentioned this to her primary care physician.  He was concern of perhaps a thrombosed hemorrhoid.  She was sent to me for evaluation    Patient Active Problem List  Diagnosis  . Essential hypertension, benign  . Anemia  . Obesity (BMI 30-39.9)  . Other screening mammogram  . Hypokalemia  . Paresthesia of left arm  . Hemorrhoids, external, thrombosed    Past Medical History  Diagnosis Date  . Kidney stones   . Anemia   . Hypertension     Past Surgical History  Procedure Laterality Date  . Caesarean section    . Tubal ligation    . Cystoscopic      extraction of ureteric calculus without disintegration    History   Social History  . Marital Status: Legally Separated    Spouse Name: N/A    Number of Children: N/A  . Years of Education: N/A   Occupational History  . minister    Social History Main Topics  . Smoking status: Former Smoker    Types: Cigarettes    Quit date: 08/05/1999   . Smokeless tobacco: Never Used  . Alcohol Use: No  . Drug Use: No  . Sexually Active: Not Currently    Birth Control/ Protection: Surgical   Other Topics Concern  . Not on file   Social History Narrative  . No narrative on file    Family History  Problem Relation Age of Onset  . Asthma    . Hypothyroidism    . Cancer Neg Hx   . Early death Neg Hx   . Heart disease Neg Hx   . Hyperlipidemia Neg Hx   . Kidney disease Neg Hx   . Stroke Neg Hx   . Hypertension Mother   . Hypothyroidism Mother   . Asthma Mother   . Hypotension Sister   . Hypothyroidism Sister   . Seizures Brother   . Hypothyroidism Sister   . Seizures Sister   . Anemia Sister     Current Outpatient Prescriptions  Medication Sig Dispense Refill  . acetaminophen (TYLENOL) 325 MG tablet Take 650 mg by mouth as needed.      . hydrochlorothiazide (HYDRODIURIL) 25 MG tablet Take 1 tablet (25 mg total) by mouth daily.  30 tablet  11  . ibuprofen (ADVIL,MOTRIN) 200 MG tablet Take 200 mg by mouth every 6 (six) hours as needed for pain.      . phentermine 37.5 MG capsule  Take 1 capsule (37.5 mg total) by mouth every morning.  30 capsule  2  . potassium chloride (K-DUR) 10 MEQ tablet Take 1 tablet (10 mEq total) by mouth 2 (two) times daily.  60 tablet  11   No current facility-administered medications for this visit.     No Known Allergies  BP 120/78  Pulse 115  Temp(Src) 97 F (36.1 C) (Temporal)  Resp 18  Ht 5\' 9"  (1.753 m)  Wt 199 lb 6.4 oz (90.447 kg)  BMI 29.43 kg/m2  LMP 09/11/2012  No results found.   Review of Systems  Constitutional: Negative for fever, chills, diaphoresis, appetite change and fatigue.  HENT: Negative for ear pain, sore throat, trouble swallowing, neck pain and ear discharge.   Eyes: Negative for photophobia, discharge and visual disturbance.  Respiratory: Negative for cough, choking, chest tightness and shortness of breath.   Cardiovascular: Negative for chest pain and  palpitations.  Gastrointestinal: Negative for nausea, vomiting, abdominal pain, diarrhea, constipation, anal bleeding and rectal pain.  Genitourinary: Negative for dysuria, frequency and difficulty urinating.  Musculoskeletal: Negative for myalgias and gait problem.  Skin: Negative for color change, pallor and rash.  Neurological: Negative for dizziness, speech difficulty, weakness and numbness.  Hematological: Negative for adenopathy.  Psychiatric/Behavioral: Negative for confusion and agitation. The patient is not nervous/anxious.        Objective:   Physical Exam  Constitutional: She is oriented to person, place, and time. She appears well-developed and well-nourished. No distress.  HENT:  Head: Normocephalic.  Mouth/Throat: Oropharynx is clear and moist. No oropharyngeal exudate.  Eyes: Conjunctivae and EOM are normal. Pupils are equal, round, and reactive to light. No scleral icterus.  Neck: Normal range of motion. Neck supple. No tracheal deviation present.  Cardiovascular: Normal rate, regular rhythm and intact distal pulses.   Pulmonary/Chest: Effort normal and breath sounds normal. No respiratory distress. She exhibits no tenderness.  Abdominal: Soft. She exhibits no distension and no mass. There is no tenderness. Hernia confirmed negative in the right inguinal area and confirmed negative in the left inguinal area.  Genitourinary: No vaginal discharge found.  Exam done with assistance of female Medical Assistant in the room.  Perianal skin clean with good hygiene.  No pruritis.  Chronically prolapsed right posterior hemorrhoid - rather sensitive.  No other external skin tags / hemorrhoids of significance.  No pilonidal disease.  No fissure.  No abscess/fistula.    Tolerates digital and anoscopic rectal exam.  Normal sphincter tone.  No other rectal masses.  Hemorrhoidal piles mildly enlarged right anterior and left lateral.  Musculoskeletal: Normal range of motion. She exhibits no  tenderness.  Lymphadenopathy:    She has no cervical adenopathy.       Right: No inguinal adenopathy present.       Left: No inguinal adenopathy present.  Neurological: She is alert and oriented to person, place, and time. No cranial nerve deficit. She exhibits normal muscle tone. Coordination normal.  Skin: Skin is warm and dry. No rash noted. She is not diaphoretic. No erythema.  Psychiatric: She has a normal mood and affect. Her behavior is normal. Judgment and thought content normal.       Assessment:     Chronically prolapsed right posterior hemorrhoid and a moderate other hemorrhoids.     Plan:     I think this is too sensitive to try banding.  I think she would benefit from surgical resection.  Do not think she will need THD for  the rest but perhaps ligations Of the internal hemorrhoids may be of benefit depending what I find.  She notes that she has been struggling for years.  The bleeding and drainage can be annoying.  She wishes to be more aggressive.  The anatomy & physiology of the anorectal region was discussed.  The pathophysiology of hemorrhoids and differential diagnosis was discussed.  Natural history risks without surgery was discussed.   I stressed the importance of a bowel regimen to have daily soft bowel movements to minimize progression of disease.  Interventions such as sclerotherapy & banding were discussed.  The patient's symptoms are not adequately controlled by medicines and other non-operative treatments.  I feel the risks & problems of no surgery outweigh the operative risks; therefore, I recommended surgery to treat the hemorrhoids by ligation, pexy, and possible resection.  Risks such as bleeding, infection, need for further treatment, heart attack, death, and other risks were discussed.   I noted a good likelihood this will help address the problem.  Goals of post-operative recovery were discussed as well.  Possibility that this will not correct all symptoms was  explained.  Post-operative pain, bleeding, constipation, and other problems after surgery were discussed.  We will work to minimize complications.   Educational handouts further explaining the pathology, treatment options, and bowel regimen were given as well.  Questions were answered.  The patient expresses understanding & wishes to proceed with surgery.

## 2012-10-19 ENCOUNTER — Other Ambulatory Visit (INDEPENDENT_AMBULATORY_CARE_PROVIDER_SITE_OTHER): Payer: Self-pay | Admitting: Surgery

## 2012-10-19 DIAGNOSIS — K649 Unspecified hemorrhoids: Secondary | ICD-10-CM

## 2012-10-19 HISTORY — PX: HEMORRHOIDECTOMY WITH HEMORRHOID BANDING: SHX5633

## 2012-10-21 ENCOUNTER — Telehealth (INDEPENDENT_AMBULATORY_CARE_PROVIDER_SITE_OTHER): Payer: Self-pay

## 2012-10-21 NOTE — Telephone Encounter (Signed)
Returned pt's daughter call about the pt wanting to know if the pt could take a bath after hemorrhoid surgery on 10-19-12. I advised pt that yes we want her taking a warm sitz bath 8-10 times a day to help with the pain. The pt has been taking Oxycodone but states it only puts her to sleep that it doesn't help with the pain. I spoke to Dr Michaell Cowing about the pt and he wants to make sure the pt is taking Aleve or Ibuprofen. The pt has not been taking the Ibuprofen so I advised for the pt to take 4 tabs of the Ibuprofen every 6hrs along with taking her Oxycodone. I advised for the pt to try all of these things with the warm sitz bath and taking the Ibuprofen before we look at switching her pain medicine to Hydrocodone. The pt understands.

## 2012-10-22 ENCOUNTER — Telehealth (INDEPENDENT_AMBULATORY_CARE_PROVIDER_SITE_OTHER): Payer: Self-pay

## 2012-10-22 NOTE — Telephone Encounter (Signed)
Called pt to notify her of the good news the path report shows just hemorrhoid  Tissue removed no cancer per Dr Michaell Cowing. The pt will keep her f/u appt with Dr Michaell Cowing.

## 2012-10-29 ENCOUNTER — Telehealth (INDEPENDENT_AMBULATORY_CARE_PROVIDER_SITE_OTHER): Payer: Self-pay | Admitting: *Deleted

## 2012-10-29 ENCOUNTER — Encounter (INDEPENDENT_AMBULATORY_CARE_PROVIDER_SITE_OTHER): Payer: Self-pay | Admitting: *Deleted

## 2012-10-29 NOTE — Telephone Encounter (Signed)
Letter completed at this time and placed at the front desk per patient.  Patient will come on Monday to come pick up letter.

## 2012-10-29 NOTE — Telephone Encounter (Signed)
Patient states she is suppose to go back to work next Tuesday April 1st but patient states she is still having some pain control when she gets up doing too much.  Patient states she has a follow up appt on April 8th and is asking for approval to wait to return to work after her follow up appt.

## 2012-10-29 NOTE — Telephone Encounter (Signed)
That is okay.  In the future call me directly since this is Friday afternoon and may not have seen this until Monday

## 2012-11-03 ENCOUNTER — Telehealth: Payer: Self-pay

## 2012-11-03 DIAGNOSIS — D519 Vitamin B12 deficiency anemia, unspecified: Secondary | ICD-10-CM

## 2012-11-03 MED ORDER — CYANOCOBALAMIN 1000 MCG/ML IJ SOLN: 1000.0000 ug | INTRAMUSCULAR | Status: DC

## 2012-11-03 NOTE — Telephone Encounter (Signed)
Pt advised and once she has picked up Rx she will make Nurse Visit appt for administration.

## 2012-11-03 NOTE — Telephone Encounter (Signed)
Pt receives injections for anemia, last injection done 10/01/2012

## 2012-11-03 NOTE — Telephone Encounter (Signed)
done

## 2012-11-03 NOTE — Telephone Encounter (Signed)
Does she have B12 deficiency?

## 2012-11-03 NOTE — Telephone Encounter (Signed)
Pt called requesting Rx for B-12 to [facility] for monthly injections

## 2012-11-09 ENCOUNTER — Encounter (INDEPENDENT_AMBULATORY_CARE_PROVIDER_SITE_OTHER): Payer: Self-pay

## 2012-11-09 ENCOUNTER — Ambulatory Visit (INDEPENDENT_AMBULATORY_CARE_PROVIDER_SITE_OTHER): Payer: Federal, State, Local not specified - PPO | Admitting: Surgery

## 2012-11-09 ENCOUNTER — Encounter (INDEPENDENT_AMBULATORY_CARE_PROVIDER_SITE_OTHER): Payer: Self-pay | Admitting: Surgery

## 2012-11-09 VITALS — BP 122/84 | HR 92 | Temp 98.5°F | Resp 18 | Ht 69.0 in | Wt 204.2 lb

## 2012-11-09 DIAGNOSIS — K5900 Constipation, unspecified: Secondary | ICD-10-CM

## 2012-11-09 DIAGNOSIS — K5909 Other constipation: Secondary | ICD-10-CM

## 2012-11-09 DIAGNOSIS — K648 Other hemorrhoids: Secondary | ICD-10-CM

## 2012-11-09 MED ORDER — NAPROXEN 500 MG PO TABS: 500.0000 mg | ORAL_TABLET | Freq: Two times a day (BID) | ORAL | Status: DC

## 2012-11-09 MED ORDER — OXYCODONE HCL 5 MG PO TABS: 5.0000 mg | ORAL_TABLET | ORAL | Status: DC | PRN

## 2012-11-09 NOTE — Progress Notes (Signed)
Subjective:     Patient ID: Mary Sanford, female   DOB: 1966-05-02, 47 y.o.   MRN: [id number]  HPI  Mary Sanford  29-Jan-1966 [id number]  Patient Care Team: Etta Grandchild, MD as PCP - General (Internal Medicine)  This patient is a 47 y.o.female who presents today for surgical evaluation Status post excision of prolapsed hemorrhoids and hemorrhoidal ligation/pexy March 2014  The patient returns now three weeks from surgery.  Still having pain.  Tried to get off narcotics.  Ran out.  Was given hydrocodone.  Do not work as well.  Did not call us.  Alternating between ibuprofen and Tylenol.  Warm soaks help.  No fevers or chills.  Bleeding less.  Still rather uncomfortable.  Does not feel like she is ready to go back to work.  Did not call us.  Patient Active Problem List  Diagnosis  . Essential hypertension, benign  . B12 deficiency anemia  . Obesity (BMI 30-39.9)  . Other screening mammogram  . Hypokalemia  . Paresthesia of left arm  . Internal prolapsed hemorrhoids s/p hemorrhoidectomy/pexy [id number]  . Constipation, chronic    Past Medical History  Diagnosis Date  . Kidney stones   . Anemia   . Hypertension     Past Surgical History  Procedure Laterality Date  . Caesarean section    . Tubal ligation    . Cystoscopic      extraction of ureteric calculus without disintegration  . Hemorrhoidectomy with hemorrhoid banding  10/14/2012    SCA Hem ligation/pexy    History   Social History  . Marital Status: Legally Separated    Spouse Name: N/A    Number of Children: N/A  . Years of Education: N/A   Occupational History  . minister    Social History Main Topics  . Smoking status: Former Smoker    Types: Cigarettes    Quit date: 08/05/1999  . Smokeless tobacco: Never Used  . Alcohol Use: No  . Drug Use: No  . Sexually Active: Not Currently    Birth Control/ Protection: Surgical   Other Topics Concern  . Not on file   Social History Narrative  . No  narrative on file    Family History  Problem Relation Age of Onset  . Asthma    . Hypothyroidism    . Cancer Neg Hx   . Early death Neg Hx   . Heart disease Neg Hx   . Hyperlipidemia Neg Hx   . Kidney disease Neg Hx   . Stroke Neg Hx   . Hypertension Mother   . Hypothyroidism Mother   . Asthma Mother   . Hypotension Sister   . Hypothyroidism Sister   . Seizures Brother   . Hypothyroidism Sister   . Seizures Sister   . Anemia Sister     Current Outpatient Prescriptions  Medication Sig Dispense Refill  . cyanocobalamin (,VITAMIN B-12,) 1000 MCG/ML injection Inject 1 mL (1,000 mcg total) into the muscle every 30 (thirty) days.  10 mL  0  . hydrochlorothiazide (HYDRODIURIL) 25 MG tablet Take 1 tablet (25 mg total) by mouth daily.  30 tablet  11  . phentermine 37.5 MG capsule Take 1 capsule (37.5 mg total) by mouth every morning.  30 capsule  2  . potassium chloride (K-DUR) 10 MEQ tablet Take 1 tablet (10 mEq total) by mouth 2 (two) times daily.  60 tablet  11  . naproxen (NAPROSYN) 500 MG tablet Take 1 tablet (500  mg total) by mouth 2 (two) times daily with a meal.  60 tablet  2  . oxyCODONE (OXY IR/ROXICODONE) 5 MG immediate release tablet Take 1-2 tablets (5-10 mg total) by mouth every 4 (four) hours as needed for pain.  50 tablet  0   No current facility-administered medications for this visit.     No Known Allergies  BP 122/84  Pulse 92  Temp(Src) 98.5 F (36.9 C) (Temporal)  Resp 18  Ht 5\' 9"  (1.753 m)  Wt 204 lb 3.2 oz (92.625 kg)  BMI 30.14 kg/m2  No results found.   Review of Systems  Constitutional: Negative for fever, chills and diaphoresis.  HENT: Negative for ear pain, sore throat and trouble swallowing.   Eyes: Negative for photophobia and visual disturbance.  Respiratory: Negative for cough and choking.   Cardiovascular: Negative for chest pain and palpitations.  Gastrointestinal: Positive for anal bleeding and rectal pain. Negative for nausea,  vomiting, abdominal pain, diarrhea and constipation.  Genitourinary: Negative for dysuria, frequency and difficulty urinating.  Musculoskeletal: Negative for myalgias and gait problem.  Skin: Negative for color change, pallor and rash.  Neurological: Negative for dizziness, speech difficulty, weakness and numbness.  Hematological: Negative for adenopathy.  Psychiatric/Behavioral: Negative for confusion and agitation. The patient is not nervous/anxious.        Objective:   Physical Exam  Constitutional: She is oriented to person, place, and time. She appears well-developed and well-nourished. No distress.  HENT:  Head: Normocephalic.  Mouth/Throat: Oropharynx is clear and moist. No oropharyngeal exudate.  Eyes: Conjunctivae and EOM are normal. Pupils are equal, round, and reactive to light. No scleral icterus.  Neck: Normal range of motion. No tracheal deviation present.  Cardiovascular: Normal rate and intact distal pulses.   Pulmonary/Chest: Effort normal. No respiratory distress. She exhibits no tenderness.  Abdominal: Soft. She exhibits no distension. There is no tenderness. Hernia confirmed negative in the right inguinal area and confirmed negative in the left inguinal area.  Incisions clean with normal healing ridges.  No hernias  Genitourinary:    No vaginal discharge found.  Musculoskeletal: Normal range of motion. She exhibits no tenderness.  Lymphadenopathy:       Right: No inguinal adenopathy present.       Left: No inguinal adenopathy present.  Neurological: She is alert and oriented to person, place, and time. No cranial nerve deficit. She exhibits normal muscle tone. Coordination normal.  Skin: Skin is warm and dry. No rash noted. She is not diaphoretic.  Psychiatric: She has a normal mood and affect. Her behavior is normal.       Assessment:     Struggling with persistent pain three weeks status post hemorrhoidectomy and hemorrhoidal ligation.  Too rapid a taper  off of narcotics.     Plan:     I strongly recommend she increase her pain control.  I renewed oxycodone.  Stop ibuprofen and give naproxen and dry.  Continue warm soaks.  I offered topical cream as well but may not help given the exposed wound.  Increase activity as tolerated to regular activity.  Do not push through pain.  Hold off return to work until in a better place.  Diet as tolerated. Bowel regimen to avoid problems.  Fiber supplement every day the rest of her life.  Return to clinic 2-3 weeks.   Call us if she still is struggling or worsens.  We can only help her if she gives [place] feedback/communicates.  Instructions discussed.  Followup with primary care physician for other health issues as would normally be done.  Questions answered.  The patient expressed understanding and appreciation

## 2012-11-09 NOTE — Patient Instructions (Addendum)
ANORECTAL SURGERY: POST OP INSTRUCTIONS  1. Take your usually prescribed home medications unless otherwise directed. 2. DIET: Follow a light bland diet the first 24 hours after arrival home, such as soup, liquids, crackers, etc.  Be sure to include lots of fluids daily.  Avoid fast food or heavy meals as your are more likely to get nauseated.  Eat a low fat the next few days after surgery.   3. PAIN CONTROL: a. Pain is best controlled by a usual combination of three different methods TOGETHER: i. Ice/Heat ii. Over the counter pain medication iii. Prescription pain medication b. Most patients will experience some swelling and discomfort in the anus/rectal area. and incisions.  Ice packs or heat (30-60 minutes up to 6 times a day) will help. Use ice for the first few days to help decrease swelling and bruising, then switch to heat such as warm towels, sitz baths, warm baths, etc to help relax tight/sore spots and speed recovery.  Some people prefer to use ice alone, heat alone, alternating between ice & heat.  Experiment to what works for you.  Swelling and bruising can take several weeks to resolve.   c. It is helpful to take an over-the-counter pain medication regularly for the first few weeks.  Choose one of the following that works best for you: i. Naproxen (Aleve, etc)  Two 220mg tabs twice a day ii. Ibuprofen (Advil, etc) Three 200mg tabs four times a day (every meal & bedtime) iii. Acetaminophen (Tylenol, etc) 500-650mg four times a day (every meal & bedtime) d. A  prescription for pain medication (such as oxycodone, hydrocodone, etc) should be given to you upon discharge.  Take your pain medication as prescribed.  i. If you are having problems/concerns with the prescription medicine (does not control pain, nausea, vomiting, rash, itching, etc), please call us [phone] to see if we need to switch you to a different pain medicine that will work better for you and/or control your side effect  better. ii. If you need a refill on your pain medication, please contact your pharmacy.  They will contact our office to request authorization. Prescriptions will not be filled after 5 pm or on week-ends. 4. KEEP YOUR BOWELS REGULAR a. The goal is one bowel movement a day b. Avoid getting constipated.  Between the surgery and the pain medications, it is common to experience some constipation.  Increasing fluid intake and taking a fiber supplement (such as Metamucil, Citrucel, FiberCon, MiraLax, etc) 1-2 times a day regularly will usually help prevent this problem from occurring.  A mild laxative (prune juice, Milk of Magnesia, MiraLax, etc) should be taken according to package directions if there are no bowel movements after 48 hours. c. Watch out for diarrhea.  If you have many loose bowel movements, simplify your diet to bland foods & liquids for a few days.  Stop any stool softeners and decrease your fiber supplement.  Switching to mild anti-diarrheal medications (Kayopectate, Pepto Bismol) can help.  If this worsens or does not improve, please call us.  5. Wound Care a. Remove your bandages the day after surgery.  Unless discharge instructions indicate otherwise, leave your bandage dry and in place overnight.  Remove the bandage during your first bowel movement.   b. Allow the wound packing to fall out over the next few days.  You can trim exposed gauze / ribbon as it falls out.  You do not need to repack the wound unless instructed otherwise.  Wear an   absorbent pad or soft cotton gauze in your underwear as needed to catch any drainage and help keep the area  c. Keep the area clean and dry.  Bathe / shower every day.  Keep the area clean by showering / bathing over the incision / wound.   It is okay to soak an open wound to help wash it.  Wet wipes or showers / gentle washing after bowel movements is often less traumatic than regular toilet paper. d. Bonita Quin may have some styrofoam-like soft packing in  the rectum which will come out with the first bowel movement.  e. You will often notice bleeding with bowel movements.  This should slow down by the end of the first week of surgery f. Expect some drainage.  This should slow down, too, by the end of the first week of surgery.  Wear an absorbent pad or soft cotton gauze in your underwear until the drainage stops. 6. ACTIVITIES as tolerated:   a. You may resume regular (light) daily activities beginning the next day-such as daily self-care, walking, climbing stairs-gradually increasing activities as tolerated.  If you can walk 30 minutes without difficulty, it is safe to try more intense activity such as jogging, treadmill, bicycling, low-impact aerobics, swimming, etc. b. Save the most intensive and strenuous activity for last such as sit-ups, heavy lifting, contact sports, etc  Refrain from any heavy lifting or straining until you are off narcotics for pain control.   c. DO NOT PUSH THROUGH PAIN.  Let pain be your guide: If it hurts to do something, don't do it.  Pain is your body warning you to avoid that activity for another week until the pain goes down. d. You may drive when you are no longer taking prescription pain medication, you can comfortably sit for long periods of time, and you can safely maneuver your car and apply brakes. e. Bonita Quin may have sexual intercourse when it is comfortable.  7. FOLLOW UP in our office a. Please call [facility] at [phone] to set up an appointment to see your surgeon in the office for a follow-up appointment approximately 2 weeks after your surgery. b. Make sure that you call for this appointment the day you arrive home to insure a convenient appointment time. 10. IF YOU HAVE DISABILITY OR FAMILY LEAVE FORMS, BRING THEM TO THE OFFICE FOR PROCESSING.  DO NOT GIVE THEM TO YOUR DOCTOR.        WHEN TO CALL us [phone]: 1. Poor pain control 2. Reactions / problems with new medications (rash/itching, nausea,  etc)  3. Fever over 101.5 F (38.5 C) 4. Inability to urinate 5. Nausea and/or vomiting 6. Worsening swelling or bruising 7. Continued bleeding from incision. 8. Increased pain, redness, or drainage from the incision  The clinic staff is available to answer your questions during regular business hours (8:30am-5pm).  Please don't hesitate to call and ask to speak to one of our nurses for clinical concerns.   A surgeon from [facility] is always on call at the hospitals   If you have a medical emergency, go to the nearest emergency room or call 911.    [facility] [address], [address], [place], [place]  [address] ? MAIN: [phone] ? TOLL FREE: [phone] ? FAX [phone] [email or web address]   GETTING TO GOOD BOWEL HEALTH. Irregular bowel habits such as constipation and diarrhea can lead to many problems over time.  Having one soft bowel movement a  day is the most important way to prevent further problems.  The anorectal canal is designed to handle stretching and feces to safely manage our ability to get rid of solid waste (feces, poop, stool) out of our body.  BUT, hard constipated stools can act like ripping concrete bricks and diarrhea can be a burning fire to this very sensitive area of our body, causing inflamed hemorrhoids, anal fissures, increasing risk is perirectal abscesses, abdominal pain/bloating, an making irritable bowel worse.     The goal: ONE SOFT BOWEL MOVEMENT A DAY!  To have soft, regular bowel movements:    Drink at least 8 tall glasses of water a day.     Take plenty of fiber.  Fiber is the undigested part of plant food that passes into the colon, acting s "natures broom" to encourage bowel motility and movement.  Fiber can absorb and hold large amounts of water. This results in a larger, bulkier stool, which is soft and easier to pass. Work gradually over several weeks up to 6 servings a day of fiber (25g a day  even more if needed) in the form of: o Vegetables -- Root (potatoes, carrots, turnips), leafy green (lettuce, salad greens, celery, spinach), or cooked high residue (cabbage, broccoli, etc) o Fruit -- Fresh (unpeeled skin & pulp), Dried (prunes, apricots, cherries, etc ),  or stewed ( applesauce)  o Whole grain breads, pasta, etc (whole wheat)  o Bran cereals    Bulking Agents -- This type of water-retaining fiber generally is easily obtained each day by one of the following:  o Psyllium bran -- The psyllium plant is remarkable because its ground seeds can retain so much water. This product is available as Metamucil, Konsyl, Effersyllium, Per Diem Fiber, or the less expensive generic preparation in drug and health food stores. Although labeled a laxative, it really is not a laxative.  o Methylcellulose -- This is another fiber derived from wood which also retains water. It is available as Citrucel. o Polyethylene Glycol - and "artificial" fiber commonly called Miralax or Glycolax.  It is helpful for people with gassy or bloated feelings with regular fiber o Flax Seed - a less gassy fiber than psyllium   No reading or other relaxing activity while on the toilet. If bowel movements take longer than 5 minutes, you are too constipated   AVOID CONSTIPATION.  High fiber and water intake usually takes care of this.  Sometimes a laxative is needed to stimulate more frequent bowel movements, but    Laxatives are not a good long-term solution as it can wear the colon out. o Osmotics (Milk of Magnesia, Fleets phosphosoda, Magnesium citrate, MiraLax, GoLytely) are safer than  o Stimulants (Senokot, Castor Oil, Dulcolax, Ex Lax)    o Do not take laxatives for more than 7days in a row.    IF SEVERELY CONSTIPATED, try a Bowel Retraining Program: o Do not use laxatives.  o Eat a diet high in roughage, such as bran cereals and leafy vegetables.  o Drink six (6) ounces of prune or apricot juice each morning.   o Eat two (2) large servings of stewed fruit each day.  o Take one (1) heaping tablespoon of a psyllium-based bulking agent twice a day. Use sugar-free sweetener when possible to avoid excessive calories.  o Eat a normal breakfast.  o Set aside 15 minutes after breakfast to sit on the toilet, but do not strain to have a bowel movement.  o If you do not  have a bowel movement by the third day, use an enema and repeat the above steps.    Controlling diarrhea o Switch to liquids and simpler foods for a few days to avoid stressing your intestines further. o Avoid dairy products (especially milk & ice cream) for a short time.  The intestines often can lose the ability to digest lactose when stressed. o Avoid foods that cause gassiness or bloating.  Typical foods include beans and other legumes, cabbage, broccoli, and dairy foods.  Every person has some sensitivity to other foods, so listen to our body and avoid those foods that trigger problems for you. o Adding fiber (Citrucel, Metamucil, psyllium, Miralax) gradually can help thicken stools by absorbing excess fluid and retrain the intestines to act more normally.  Slowly increase the dose over a few weeks.  Too much fiber too soon can backfire and cause cramping & bloating. o Probiotics (such as active yogurt, Align, etc) may help repopulate the intestines and colon with normal bacteria and calm down a sensitive digestive tract.  Most studies show it to be of mild help, though, and such products can be costly. o Medicines:   Bismuth subsalicylate (ex. Kayopectate, Pepto Bismol) every 30 minutes for up to 6 doses can help control diarrhea.  Avoid if pregnant.   Loperamide (Immodium) can slow down diarrhea.  Start with two tablets (4mg  total) first and then try one tablet every 6 hours.  Avoid if you are having fevers or severe pain.  If you are not better or start feeling worse, stop all medicines and call your doctor for advice o Call your doctor if you  are getting worse or not better.  Sometimes further testing (cultures, endoscopy, X-ray studies, bloodwork, etc) may be needed to help diagnose and treat the cause of the diarrhea.  Managing Pain  Pain after surgery or related to activity is often due to strain/injury to muscle, tendon, nerves and/or incisions.  This pain is usually short-term and will improve in a few months.   Many people find it helpful to do the following things TOGETHER to help speed the process of healing and to get back to regular activity more quickly:  1. Avoid heavy physical activity a.  no lifting greater than 20 pounds b. Do not "push through" the pain.  Listen to your body and avoid positions and maneuvers than reproduce the pain c. Walking is okay as tolerated, but go slowly and stop when getting sore.  d. Remember: If it hurts to do it, then don't do it! 2. Take Anti-inflammatory medication  a. Take with food/snack around the clock for 1-2 weeks i. This helps the muscle and nerve tissues become less irritable and calm down faster b. Choose ONE of the following over-the-counter medications: i. Naproxen 220mg  tabs (ex. Aleve) 1-2 pills twice a day  ii. Ibuprofen 200mg  tabs (ex. Advil, Motrin) 3-4 pills with every meal and just before bedtime iii. Acetaminophen 500mg  tabs (Tylenol) 1-2 pills with every meal and just before bedtime 3. Use a Heating pad or Ice/Cold Pack a. 4-6 times a day b. May use warm bath/hottub  or showers 4. Try Gentle Massage and/or Stretching  a. at the area of pain many times a day b. stop if you feel pain - do not overdo it  Try these steps together to help you body heal faster and avoid making things get worse.  Doing just one of these things may not be enough.    If you are  not getting better after two weeks or are noticing you are getting worse, contact our office for further advice; we may need to re-evaluate you & see what other things we can do to help.   HEMORRHOIDS  The  rectum is the last foot of your colon, and it naturally stretches to hold stool.  Hemorrhoidal piles are natural clusters of blood vessels that help the rectum and anal canal stretch to hold stool and allow bowel movements to eliminate feces.   Hemorrhoids are abnormally swollen blood vessels in the rectum.  Too much pressure in the rectum causes hemorrhoids by forcing blood to stretch and bulge the walls of the veins, sometimes even rupturing them.  Hemorrhoids can become like varicose veins you might see on a person's legs.  Most people will develop a flare of hemorrhoids in their lifetime.  When bulging hemorrhoidal veins are irritated, they can swell, burn, itch, cause pain, and bleed.  Most flares will calm down gradually own within a few weeks.  However, once hemorrhoids are created, they are difficult to get rid of completely and tend to flare more easily than the first flare.   Fortunately, good habits and simple medical treatment usually control hemorrhoids well, and surgery is needed only in severe cases. Types of Hemorrhoids:  Internal hemorrhoids usually don't initially hurt or itch; they are deep inside the rectum and usually have no sensation. If they begin to push out (prolapse), pain and burning can occur.  However, internal hemorrhoids can bleed.  Anal bleeding should not be ignored since bleeding could come from a dangerous source like colorectal cancer, so persistent rectal bleeding should be investigated by a doctor, sometimes with a colonoscopy.  External hemorrhoids cause most of the symptoms - pain, burning, and itching. Nonirritated hemorrhoids can look like small skin tags coming out of the anus.   Thrombosed hemorrhoids can form when a hemorrhoid blood vessel bursts and causes the hemorrhoid to suddenly swell.  A purple blood clot can form in it and become an excruciatingly painful lump at the anus. Because of these unpleasant symptoms, immediate incision and drainage by a surgeon at  an office visit can provide much relief of the pain.    PREVENTION Avoiding the most frequent causes listed below will prevent most cases of hemorrhoids: Constipation Hard stools Diarrhea  Constant sitting  Straining with bowel movements Sitting on the toilet for a long time  Severe coughing  episodes Pregnancy / Childbirth  Heavy Lifting  Sometimes avoiding the above triggers is difficult:  How can you avoid sitting all day if you have a seated job? Also, we try to avoid coughing and diarrhea, but sometimes it's beyond your control.  Still, there are some practical hints to help: Keep the anal and genital area clean.  Moistened tissues such as flushable wet wipes are less irritating than toilet paper.  Using irrigating showers or bottle irrigation washing gently cleans this sensitive area.   Avoid dry toilet paper when cleaning after bowel movements.  Marland Kitchen Keep the anal and genital area dry.  Lightly pat the rectal area dry.  Avoid rubbing.  Talcum or baby powders can help GET YOUR STOOLS SOFT.   This is the most important way to prevent irritated hemorrhoids.  Hard stools are like sandpaper to the anorectal canal and will cause more problems.  The goal: ONE SOFT BOWEL MOVEMENT A DAY!  BMs from every other day to 3 times a day is a tolerable range Treat coughing, diarrhea  and constipation early since irritated hemorrhoids may soon follow.  If your main job activity is seated, always stand or walk during your breaks. Make it a point to stand and walk at least 5 minutes every hour and try to shift frequently in your chair to avoid direct rectal pressure.  Always exhale as you strain or lift. Don't hold your breath.  Do not delay or try to prevent a bowel movement when the urge is present. Exercise regularly (walking or jogging 60 minutes a day) to stimulate the bowels to move. No reading or other activity while on the toilet. If bowel movements take longer than 5 minutes, you are too  constipated. AVOID CONSTIPATION Drink plenty of liquids (1 1/2 to 2 quarts of water and other fluids a day unless fluid restricted for another medical condition). Liquids that contain caffeine (coffee a, tea, soft drinks) can be dehydrating and should be avoided until constipation is controlled. Consider minimizing milk, as dairy products may be constipating. Eat plenty of fiber (30g a day ideal, more if needed).  Fiber is the undigested part of plant food that passes into the colon, acting as "natures broom" to encourage bowel motility and movement.  Fiber can absorb and hold large amounts of water. This results in a larger, bulkier stool, which is soft and easier to pass.  Eating foods high in fiber - 12 servings - such as  Vegetables: Root (potatoes, carrots, turnips), Leafy green (lettuce, salad greens, celery, spinach), High residue (cabbage, broccoli, etc.) Fruit: Fresh, Dried (prunes, apricots, cherries), Stewed (applesauce)  Whole grain breads, pasta, whole wheat Bran cereals, muffins, etc. Consider adding supplemental bulking fiber which retains large volumes of water: Psyllium ground seeds --available as Metamucil, Konsyl, Effersyllium, Per Diem Fiber, or the less expensive generic forms.  Citrucel  (methylcellulose wood fiber) . FiberCon (Polycarbophil) Polyethylene Glycol - and "artificial" fiber commonly called Miralax or Glycolax.  It is helpful for people with gassy or bloated feelings with regular fiber Flax Seed - a less gassy natural fiber  Laxatives can be useful for a short period if constipation is severe Osmotics (Milk of Magnesia, Fleets Phospho-Soda, Magnesium Citrate)  Stimulants (Senokot,   Castor Oil,  Dulcolax, Ex-Lax)    Laxatives are not a good long-term solution as it can stress the bowels and cause too much mineral loss and dehydration.   Avoid taking laxatives for more than 7 days in a row.  AVOID DIARRHEA Switch to liquids and simpler foods for a few days to  avoid stressing your intestines further. Avoid dairy products (especially milk & ice cream) for a short time.  The intestines often can lose the ability to digest lactose when stressed. Avoid foods that cause gassiness or bloating.  Typical foods include beans and other legumes, cabbage, broccoli, and dairy foods.  Every person has some sensitivity to other foods, so listen to your body and avoid those foods that trigger problems for you. Adding fiber (Citrucel, Metamucil, FiberCon, Flax seed, Miralax) gradually can help thicken stools by absorbing excess fluid and retrain the intestines to act more normally.  Slowly increase the dose over a few weeks.  Too much fiber too soon can backfire and cause cramping & bloating. Probiotics (such as active yogurt, Align, etc) may help repopulate the intestines and colon with normal bacteria and calm down a sensitive digestive tract.  Most studies show it to be of mild help, though, and such products can be costly. Medicines: Bismuth subsalicylate (ex. Kayopectate, Pepto Bismol) every  30 minutes for up to 6 doses can help control diarrhea.  Avoid if pregnant. Loperamide (Immodium) can slow down diarrhea.  Start with two tablets (4mg  total) first and then try one tablet every 6 hours.  Avoid if you are having fevers or severe pain.  If you are not better or start feeling worse, stop all medicines and call your doctor for advice Call your doctor if you are getting worse or not better.  Sometimes further testing (cultures, endoscopy, X-ray studies, bloodwork, etc) may be needed to help diagnose and treat the cause of the diarrhea. TREATMENT OF HEMORRHOID FLARE If these preventive measures fail, you must take action right away! Hemorrhoids are one condition that can be mild in the morning and become intolerable by nightfall. Most hemorrhoidal flares take several weeks to calm down.  These suggestions can help: Warm soaks.  This helps more than any topical medication.   Use up to 8 times a day.  Usually sitz baths or sitting in a warm bathtub helps.  Sitting on moist warm towels are helpful.  Switching to ice packs/cool compresses can be helpful Normalize your bowels.  Extremes of diarrhea or constipation will make hemorrhoids worse.  One soft bowel movement a day is the goal.  Fiber can help get your bowels regular Wet wipes instead of toilet paper Pain control with a NSAID such as ibuprofen (Advil) or naproxen (Aleve) or acetaminophen (Tylenol) around the clock.  Narcotics are constipating and should be minimized if possible Topical creams contain steroids (bydrocortisone) or local anesthetic (xylocaine) can help make pain and itching more tolerable.   EVALUATION If hemorrhoids are still causing problems, you could benefit by an evaluation by a surgeon.  The surgeon will obtain a history and examine you.  If hemorrhoids are diagnosed, some therapies can be offered in the office, usually with an anoscope into the less sensitive area of the rectum: -injection of hemorrhoids (sclerotherapy) can scar the blood vessels of the swollen/enlarged hemorrhoids to help shrink them down to a more normal size -rubber banding of the enlarged hemorrhoids to help shrink them down to a more normal size -drainage of the blood clot causing a thrombosed hemorrhoid,  to relieve the severe pain   While 90% of the time such problems from hemorrhoids can be managed without preceding to surgery, sometimes the hemorrhoids require a operation to control the problem (uncontrolled bleeding, prolapse, pain, etc.).   This involves being placed under general anesthesia where the surgeon can confirm the diagnosis and remove, suture, or staple the hemorrhoid(s).  Your surgeon can help you treat the problem appropriately.

## 2012-11-11 ENCOUNTER — Ambulatory Visit: Payer: Federal, State, Local not specified - PPO | Admitting: Internal Medicine

## 2012-11-24 ENCOUNTER — Encounter (INDEPENDENT_AMBULATORY_CARE_PROVIDER_SITE_OTHER): Payer: Federal, State, Local not specified - PPO | Admitting: Surgery

## 2012-11-29 ENCOUNTER — Other Ambulatory Visit: Payer: Self-pay | Admitting: Cardiovascular Disease

## 2012-12-01 ENCOUNTER — Ambulatory Visit (INDEPENDENT_AMBULATORY_CARE_PROVIDER_SITE_OTHER): Payer: Federal, State, Local not specified - PPO | Admitting: Surgery

## 2012-12-01 ENCOUNTER — Encounter (INDEPENDENT_AMBULATORY_CARE_PROVIDER_SITE_OTHER): Payer: Self-pay | Admitting: Surgery

## 2012-12-01 VITALS — BP 128/82 | HR 80 | Resp 18 | Ht 69.0 in | Wt 202.0 lb

## 2012-12-01 DIAGNOSIS — K5900 Constipation, unspecified: Secondary | ICD-10-CM

## 2012-12-01 DIAGNOSIS — K648 Other hemorrhoids: Secondary | ICD-10-CM

## 2012-12-01 DIAGNOSIS — K5909 Other constipation: Secondary | ICD-10-CM

## 2012-12-01 NOTE — Progress Notes (Signed)
Subjective:     Patient ID: Mary Sanford, female   DOB: Feb 01, 1966, 47 y.o.   MRN: [id number]  HPI   MELISIA LEMING  20-Nov-1965 [id number]  Patient Care Team: Etta Grandchild, MD as PCP - General (Internal Medicine)  This patient is a 47 y.o.female who presents today for surgical evaluation Status post excision of prolapsed hemorrhoids and THD hemorrhoidal ligation/pexy 10/19/2012  The patient returns now 5 weeks from surgery.  She is feeling much better.  No more pain.  No more bleeding.  No fevers or chills.  She is in good spirits.  Claims to have a bowel movement every one to three days.  Having oatmeal and bran intake.  No supplemental fiber.  Patient Active Problem List   Diagnosis Date Noted  . Constipation, chronic 10/04/2012  . Paresthesia of left arm 09/30/2012  . Internal prolapsed hemorrhoids s/p hemorrhoidectomy/pexy [id number] 09/30/2012  . Hypokalemia 05/13/2012  . Other screening mammogram 01/12/2012  . B12 deficiency anemia 01/09/2012  . Obesity (BMI 30-39.9) 01/09/2012  . Essential hypertension, benign 11/24/2011    Past Medical History  Diagnosis Date  . Kidney stones   . Anemia   . Hypertension     Past Surgical History  Procedure Laterality Date  . Caesarean section    . Tubal ligation    . Cystoscopic      extraction of ureteric calculus without disintegration  . Hemorrhoidectomy with hemorrhoid banding  10/14/2012    SCA Hem ligation/pexy    History   Social History  . Marital Status: Legally Separated    Spouse Name: N/A    Number of Children: N/A  . Years of Education: N/A   Occupational History  . minister    Social History Main Topics  . Smoking status: Former Smoker    Types: Cigarettes    Quit date: 08/05/1999  . Smokeless tobacco: Never Used  . Alcohol Use: No  . Drug Use: No  . Sexually Active: Not Currently    Birth Control/ Protection: Surgical   Other Topics Concern  . Not on file   Social History Narrative  . No  narrative on file    Family History  Problem Relation Age of Onset  . Asthma    . Hypothyroidism    . Cancer Neg Hx   . Early death Neg Hx   . Heart disease Neg Hx   . Hyperlipidemia Neg Hx   . Kidney disease Neg Hx   . Stroke Neg Hx   . Hypertension Mother   . Hypothyroidism Mother   . Asthma Mother   . Hypotension Sister   . Hypothyroidism Sister   . Seizures Brother   . Hypothyroidism Sister   . Seizures Sister   . Anemia Sister     Current Outpatient Prescriptions  Medication Sig Dispense Refill  . cyanocobalamin (,VITAMIN B-12,) 1000 MCG/ML injection Inject 1 mL (1,000 mcg total) into the muscle every 30 (thirty) days.  10 mL  0  . hydrochlorothiazide (HYDRODIURIL) 25 MG tablet TAKE ONE TABLET BY MOUTH EVERY DAY  30 tablet  1  . phentermine 37.5 MG capsule Take 1 capsule (37.5 mg total) by mouth every morning.  30 capsule  2  . potassium chloride (K-DUR) 10 MEQ tablet Take 1 tablet (10 mEq total) by mouth 2 (two) times daily.  60 tablet  11   No current facility-administered medications for this visit.     Allergies  Allergen Reactions  . Naproxen  Panic attacks    BP 128/82  Pulse 80  Resp 18  Ht 5\' 9"  (1.753 m)  Wt 202 lb (91.627 kg)  BMI 29.82 kg/m2  No results found.   Review of Systems  Constitutional: Negative for fever, chills and diaphoresis.  HENT: Negative for ear pain, sore throat and trouble swallowing.   Eyes: Negative for photophobia and visual disturbance.  Respiratory: Negative for cough and choking.   Cardiovascular: Negative for chest pain and palpitations.  Gastrointestinal: Positive for anal bleeding and rectal pain. Negative for nausea, vomiting, abdominal pain, diarrhea and constipation.  Genitourinary: Negative for dysuria, frequency and difficulty urinating.  Musculoskeletal: Negative for myalgias and gait problem.  Skin: Negative for color change, pallor and rash.  Neurological: Negative for dizziness, speech difficulty,  weakness and numbness.  Hematological: Negative for adenopathy.  Psychiatric/Behavioral: Negative for confusion and agitation. The patient is not nervous/anxious.        Objective:   Physical Exam  Constitutional: She is oriented to person, place, and time. She appears well-developed and well-nourished. No distress.  HENT:  Head: Normocephalic.  Mouth/Throat: Oropharynx is clear and moist. No oropharyngeal exudate.  Eyes: Conjunctivae and EOM are normal. Pupils are equal, round, and reactive to light. No scleral icterus.  Neck: Normal range of motion. No tracheal deviation present.  Cardiovascular: Normal rate and intact distal pulses.   Pulmonary/Chest: Effort normal. No respiratory distress. She exhibits no tenderness.  Abdominal: Soft. She exhibits no distension. There is no tenderness. Hernia confirmed negative in the right inguinal area and confirmed negative in the left inguinal area.  Incisions clean with normal healing ridges.  No hernias  Genitourinary:    No vaginal discharge found.  Musculoskeletal: Normal range of motion. She exhibits no tenderness.  Lymphadenopathy:       Right: No inguinal adenopathy present.       Left: No inguinal adenopathy present.  Neurological: She is alert and oriented to person, place, and time. No cranial nerve deficit. She exhibits normal muscle tone. Coordination normal.  Skin: Skin is warm and dry. No rash noted. She is not diaphoretic.  Psychiatric: She has a normal mood and affect. Her behavior is normal.       Assessment:     Improving 5 weeks status post hemorrhoidectomy and hemorrhoidal ligation.    Plan:     Continue warm soaks.   Increase activity as tolerated to regular activity.  Do not push through pain.   Diet as tolerated. Bowel regimen to avoid problems.  Fiber supplement every day the rest of her life.  I again told her this.  She will think about it.  Return to clinic PRN.  Instructions discussed.  Followup with  primary care physician for other health issues as would normally be done.  Questions answered.  The patient expressed understanding and appreciation

## 2012-12-01 NOTE — Patient Instructions (Addendum)
HEMORRHOIDS  The rectum is the last foot of your colon, and it naturally stretches to hold stool.  Hemorrhoidal piles are natural clusters of blood vessels that help the rectum and anal canal stretch to hold stool and allow bowel movements to eliminate feces.   Hemorrhoids are abnormally swollen blood vessels in the rectum.  Too much pressure in the rectum causes hemorrhoids by forcing blood to stretch and bulge the walls of the veins, sometimes even rupturing them.  Hemorrhoids can become like varicose veins you might see on a person's legs.  Most people will develop a flare of hemorrhoids in their lifetime.  When bulging hemorrhoidal veins are irritated, they can swell, burn, itch, cause pain, and bleed.  Most flares will calm down gradually own within a few weeks.  However, once hemorrhoids are created, they are difficult to get rid of completely and tend to flare more easily than the first flare.   Fortunately, good habits and simple medical treatment usually control hemorrhoids well, and surgery is needed only in severe cases. Types of Hemorrhoids:  Internal hemorrhoids usually don't initially hurt or itch; they are deep inside the rectum and usually have no sensation. If they begin to push out (prolapse), pain and burning can occur.  However, internal hemorrhoids can bleed.  Anal bleeding should not be ignored since bleeding could come from a dangerous source like colorectal cancer, so persistent rectal bleeding should be investigated by a doctor, sometimes with a colonoscopy.  External hemorrhoids cause most of the symptoms - pain, burning, and itching. Nonirritated hemorrhoids can look like small skin tags coming out of the anus.   Thrombosed hemorrhoids can form when a hemorrhoid blood vessel bursts and causes the hemorrhoid to suddenly swell.  A purple blood clot can form in it and become an excruciatingly painful lump at the anus. Because of these unpleasant symptoms, immediate incision and  drainage by a surgeon at an office visit can provide much relief of the pain.    PREVENTION Avoiding the most frequent causes listed below will prevent most cases of hemorrhoids: Constipation Hard stools Diarrhea  Constant sitting  Straining with bowel movements Sitting on the toilet for a long time  Severe coughing  episodes Pregnancy / Childbirth  Heavy Lifting  Sometimes avoiding the above triggers is difficult:  How can you avoid sitting all day if you have a seated job? Also, we try to avoid coughing and diarrhea, but sometimes it's beyond your control.  Still, there are some practical hints to help: Keep the anal and genital area clean.  Moistened tissues such as flushable wet wipes are less irritating than toilet paper.  Using irrigating showers or bottle irrigation washing gently cleans this sensitive area.   Avoid dry toilet paper when cleaning after bowel movements.  . Keep the anal and genital area dry.  Lightly pat the rectal area dry.  Avoid rubbing.  Talcum or baby powders can help GET YOUR STOOLS SOFT.   This is the most important way to prevent irritated hemorrhoids.  Hard stools are like sandpaper to the anorectal canal and will cause more problems.  The goal: ONE SOFT BOWEL MOVEMENT A DAY!  BMs from every other day to 3 times a day is a tolerable range Treat coughing, diarrhea and constipation early since irritated hemorrhoids may soon follow.  If your main job activity is seated, always stand or walk during your breaks. Make it a point to stand and walk at least 5 minutes every hour   and try to shift frequently in your chair to avoid direct rectal pressure.  Always exhale as you strain or lift. Don't hold your breath.  Do not delay or try to prevent a bowel movement when the urge is present. Exercise regularly (walking or jogging 60 minutes a day) to stimulate the bowels to move. No reading or other activity while on the toilet. If bowel movements take longer than 5 minutes,  you are too constipated. AVOID CONSTIPATION Drink plenty of liquids (1 1/2 to 2 quarts of water and other fluids a day unless fluid restricted for another medical condition). Liquids that contain caffeine (coffee a, tea, soft drinks) can be dehydrating and should be avoided until constipation is controlled. Consider minimizing milk, as dairy products may be constipating. Eat plenty of fiber (30g a day ideal, more if needed).  Fiber is the undigested part of plant food that passes into the colon, acting as "natures broom" to encourage bowel motility and movement.  Fiber can absorb and hold large amounts of water. This results in a larger, bulkier stool, which is soft and easier to pass.  Eating foods high in fiber - 12 servings - such as  Vegetables: Root (potatoes, carrots, turnips), Leafy green (lettuce, salad greens, celery, spinach), High residue (cabbage, broccoli, etc.) Fruit: Fresh, Dried (prunes, apricots, cherries), Stewed (applesauce)  Whole grain breads, pasta, whole wheat Bran cereals, muffins, etc. Consider adding supplemental bulking fiber which retains large volumes of water: Psyllium ground seeds --available as Metamucil, Konsyl, Effersyllium, Per Diem Fiber, or the less expensive generic forms.  Citrucel  (methylcellulose wood fiber) . FiberCon (Polycarbophil) Polyethylene Glycol - and "artificial" fiber commonly called Miralax or Glycolax.  It is helpful for people with gassy or bloated feelings with regular fiber Flax Seed - a less gassy natural fiber  Laxatives can be useful for a short period if constipation is severe Osmotics (Milk of Magnesia, Fleets Phospho-Soda, Magnesium Citrate)  Stimulants (Senokot,   Castor Oil,  Dulcolax, Ex-Lax)    Laxatives are not a good long-term solution as it can stress the bowels and cause too much mineral loss and dehydration.   Avoid taking laxatives for more than 7 days in a row.  AVOID DIARRHEA Switch to liquids and simpler foods for a few  days to avoid stressing your intestines further. Avoid dairy products (especially milk & ice cream) for a short time.  The intestines often can lose the ability to digest lactose when stressed. Avoid foods that cause gassiness or bloating.  Typical foods include beans and other legumes, cabbage, broccoli, and dairy foods.  Every person has some sensitivity to other foods, so listen to your body and avoid those foods that trigger problems for you. Adding fiber (Citrucel, Metamucil, FiberCon, Flax seed, Miralax) gradually can help thicken stools by absorbing excess fluid and retrain the intestines to act more normally.  Slowly increase the dose over a few weeks.  Too much fiber too soon can backfire and cause cramping & bloating. Probiotics (such as active yogurt, Align, etc) may help repopulate the intestines and colon with normal bacteria and calm down a sensitive digestive tract.  Most studies show it to be of mild help, though, and such products can be costly. Medicines: Bismuth subsalicylate (ex. Kayopectate, Pepto Bismol) every 30 minutes for up to 6 doses can help control diarrhea.  Avoid if pregnant. Loperamide (Immodium) can slow down diarrhea.  Start with two tablets (4mg total) first and then try one tablet every 6 hours.    Avoid if you are having fevers or severe pain.  If you are not better or start feeling worse, stop all medicines and call your doctor for advice Call your doctor if you are getting worse or not better.  Sometimes further testing (cultures, endoscopy, X-ray studies, bloodwork, etc) may be needed to help diagnose and treat the cause of the diarrhea. TREATMENT OF HEMORRHOID FLARE If these preventive measures fail, you must take action right away! Hemorrhoids are one condition that can be mild in the morning and become intolerable by nightfall. Most hemorrhoidal flares take several weeks to calm down.  These suggestions can help: Warm soaks.  This helps more than any topical  medication.  Use up to 8 times a day.  Usually sitz baths or sitting in a warm bathtub helps.  Sitting on moist warm towels are helpful.  Switching to ice packs/cool compresses can be helpful Normalize your bowels.  Extremes of diarrhea or constipation will make hemorrhoids worse.  One soft bowel movement a day is the goal.  Fiber can help get your bowels regular Wet wipes instead of toilet paper Pain control with a NSAID such as ibuprofen (Advil) or naproxen (Aleve) or acetaminophen (Tylenol) around the clock.  Narcotics are constipating and should be minimized if possible Topical creams contain steroids (bydrocortisone) or local anesthetic (xylocaine) can help make pain and itching more tolerable.   EVALUATION If hemorrhoids are still causing problems, you could benefit by an evaluation by a surgeon.  The surgeon will obtain a history and examine you.  If hemorrhoids are diagnosed, some therapies can be offered in the office, usually with an anoscope into the less sensitive area of the rectum: -injection of hemorrhoids (sclerotherapy) can scar the blood vessels of the swollen/enlarged hemorrhoids to help shrink them down to a more normal size -rubber banding of the enlarged hemorrhoids to help shrink them down to a more normal size -drainage of the blood clot causing a thrombosed hemorrhoid,  to relieve the severe pain   While 90% of the time such problems from hemorrhoids can be managed without preceding to surgery, sometimes the hemorrhoids require a operation to control the problem (uncontrolled bleeding, prolapse, pain, etc.).   This involves being placed under general anesthesia where the surgeon can confirm the diagnosis and remove, suture, or staple the hemorrhoid(s).  Your surgeon can help you treat the problem appropriately.    

## 2012-12-07 ENCOUNTER — Ambulatory Visit (INDEPENDENT_AMBULATORY_CARE_PROVIDER_SITE_OTHER): Payer: Federal, State, Local not specified - PPO

## 2012-12-07 DIAGNOSIS — D518 Other vitamin B12 deficiency anemias: Secondary | ICD-10-CM

## 2012-12-07 DIAGNOSIS — D519 Vitamin B12 deficiency anemia, unspecified: Secondary | ICD-10-CM

## 2012-12-07 MED ORDER — CYANOCOBALAMIN 1000 MCG/ML IJ SOLN
1000.0000 ug | Freq: Once | INTRAMUSCULAR | Status: AC
  Administered 2012-12-07: 1000 ug via INTRAMUSCULAR

## 2012-12-21 ENCOUNTER — Ambulatory Visit (INDEPENDENT_AMBULATORY_CARE_PROVIDER_SITE_OTHER): Payer: Federal, State, Local not specified - PPO

## 2012-12-21 DIAGNOSIS — D518 Other vitamin B12 deficiency anemias: Secondary | ICD-10-CM

## 2012-12-21 DIAGNOSIS — D519 Vitamin B12 deficiency anemia, unspecified: Secondary | ICD-10-CM

## 2012-12-21 MED ORDER — CYANOCOBALAMIN 1000 MCG/ML IJ SOLN
1000.0000 ug | Freq: Once | INTRAMUSCULAR | Status: AC
  Administered 2012-12-21: 1000 ug via INTRAMUSCULAR

## 2012-12-28 ENCOUNTER — Ambulatory Visit: Payer: Federal, State, Local not specified - PPO | Admitting: Internal Medicine

## 2012-12-29 ENCOUNTER — Ambulatory Visit: Payer: Federal, State, Local not specified - PPO | Admitting: Internal Medicine

## 2013-01-04 ENCOUNTER — Ambulatory Visit (INDEPENDENT_AMBULATORY_CARE_PROVIDER_SITE_OTHER): Payer: Federal, State, Local not specified - PPO

## 2013-01-04 DIAGNOSIS — D518 Other vitamin B12 deficiency anemias: Secondary | ICD-10-CM

## 2013-01-04 DIAGNOSIS — D519 Vitamin B12 deficiency anemia, unspecified: Secondary | ICD-10-CM

## 2013-01-04 MED ORDER — CYANOCOBALAMIN 1000 MCG/ML IJ SOLN
1000.0000 ug | INTRAMUSCULAR | Status: DC
  Administered 2013-01-04 – 2016-05-22 (×10): 1000 ug via INTRAMUSCULAR

## 2013-01-12 ENCOUNTER — Ambulatory Visit (INDEPENDENT_AMBULATORY_CARE_PROVIDER_SITE_OTHER): Payer: Federal, State, Local not specified - PPO | Admitting: Internal Medicine

## 2013-01-12 ENCOUNTER — Encounter: Payer: Self-pay | Admitting: Internal Medicine

## 2013-01-12 ENCOUNTER — Other Ambulatory Visit (INDEPENDENT_AMBULATORY_CARE_PROVIDER_SITE_OTHER): Payer: Federal, State, Local not specified - PPO

## 2013-01-12 VITALS — BP 122/70 | HR 91 | Temp 98.1°F | Resp 16 | Wt 202.8 lb

## 2013-01-12 DIAGNOSIS — Z0000 Encounter for general adult medical examination without abnormal findings: Secondary | ICD-10-CM

## 2013-01-12 DIAGNOSIS — H9193 Unspecified hearing loss, bilateral: Secondary | ICD-10-CM

## 2013-01-12 DIAGNOSIS — H9190 Unspecified hearing loss, unspecified ear: Secondary | ICD-10-CM

## 2013-01-12 DIAGNOSIS — Z1231 Encounter for screening mammogram for malignant neoplasm of breast: Secondary | ICD-10-CM

## 2013-01-12 DIAGNOSIS — I10 Essential (primary) hypertension: Secondary | ICD-10-CM

## 2013-01-12 DIAGNOSIS — J309 Allergic rhinitis, unspecified: Secondary | ICD-10-CM

## 2013-01-12 LAB — LIPID PANEL
Cholesterol: 117 mg/dL (ref 0–200)
HDL: 42.1 mg/dL (ref >39.00)
LDL Cholesterol: 62 mg/dL (ref 0–99)
Total CHOL/HDL Ratio: 3
Triglycerides: 64 mg/dL (ref 0.0–149.0)
VLDL: 12.8 mg/dL (ref 0.0–40.0)

## 2013-01-12 LAB — CBC WITH DIFFERENTIAL/PLATELET
Basophils Relative: 1.3 % (ref 0.0–3.0)
Eosinophils Relative: 1.1 % (ref 0.0–5.0)
HCT: 35.8 % — ABNORMAL LOW (ref 36.0–46.0)
Hemoglobin: 11.2 g/dL — ABNORMAL LOW (ref 12.0–15.0)
MCHC: 31.2 g/dL (ref 30.0–36.0)
MCV: 65.8 fl — ABNORMAL LOW (ref 78.0–100.0)
Monocytes Absolute: 0.6 10*3/uL (ref 0.1–1.0)
Neutro Abs: 2.5 10*3/uL (ref 1.4–7.7)
Neutrophils Relative %: 48.9 % (ref 43.0–77.0)
RBC: 5.45 Mil/uL — ABNORMAL HIGH (ref 3.87–5.11)
WBC: 5.2 10*3/uL (ref 4.5–10.5)

## 2013-01-12 LAB — COMPREHENSIVE METABOLIC PANEL
AST: 15 U/L (ref 0–37)
Albumin: 3.5 g/dL (ref 3.5–5.2)
Alkaline Phosphatase: 44 U/L (ref 39–117)
BUN: 8 mg/dL (ref 6–23)
Creatinine, Ser: 0.8 mg/dL (ref 0.4–1.2)
Glucose, Bld: 83 mg/dL (ref 70–99)
Total Bilirubin: 0.4 mg/dL (ref 0.3–1.2)

## 2013-01-12 MED ORDER — CETIRIZINE HCL 10 MG PO TABS
10.0000 mg | ORAL_TABLET | Freq: Every day | ORAL | Status: DC
Start: 2013-01-12 — End: 2013-06-06

## 2013-01-12 NOTE — Progress Notes (Signed)
Subjective:    Patient ID: Mary Sanford, female    DOB: 1965-10-22, 47 y.o.   MRN: [id number]  Allergic Reaction This is a recurrent problem. The current episode started more than 1 week ago. The problem occurs intermittently. The problem is unchanged. The problem is mild. It is unknown (pollen) what she was exposed to. Pertinent negatives include no abdominal pain, chest pain, chest pressure, coughing, diarrhea, difficulty breathing, drooling, eye itching, eye redness, eye watering, globus sensation, hyperventilation, itching, rash, stridor, trouble swallowing, vomiting or wheezing. There is no swelling present. Past treatments include nothing. Her past medical history is significant for seasonal allergies.      Review of Systems  Constitutional: Negative.   HENT: Positive for hearing loss, congestion and tinnitus. Negative for ear pain, nosebleeds, sore throat, facial swelling, rhinorrhea, sneezing, drooling, mouth sores, trouble swallowing, neck pain, neck stiffness, dental problem, postnasal drip, sinus pressure and ear discharge.   Eyes: Negative.  Negative for redness and itching.  Respiratory: Negative.  Negative for cough, chest tightness, wheezing and stridor.   Cardiovascular: Negative.  Negative for chest pain.  Gastrointestinal: Positive for constipation. Negative for nausea, vomiting, abdominal pain, diarrhea, abdominal distention, anal bleeding and rectal pain.  Endocrine: Negative.   Genitourinary: Negative.   Musculoskeletal: Negative.   Skin: Negative.  Negative for itching and rash.  Allergic/Immunologic: Negative.   Neurological: Negative.  Negative for dizziness.  Hematological: Negative.   Psychiatric/Behavioral: Negative.        Objective:   Physical Exam  Vitals reviewed. Constitutional: She is oriented to person, place, and time. She appears well-developed and well-nourished. No distress.  HENT:  Head: Normocephalic and atraumatic. No trismus in the jaw.   Right Ear: Hearing, tympanic membrane, external ear and ear canal normal.  Left Ear: Hearing, tympanic membrane, external ear and ear canal normal.  Nose: No mucosal edema, rhinorrhea or sinus tenderness. No epistaxis.  No foreign bodies. Right sinus exhibits no maxillary sinus tenderness and no frontal sinus tenderness. Left sinus exhibits no maxillary sinus tenderness and no frontal sinus tenderness.  Mouth/Throat: Oropharynx is clear and moist and mucous membranes are normal. Mucous membranes are not pale, not dry and not cyanotic. No oral lesions. No edematous. No oropharyngeal exudate, posterior oropharyngeal edema, posterior oropharyngeal erythema or tonsillar abscesses.  Eyes: Conjunctivae are normal. Right eye exhibits no discharge. Left eye exhibits no discharge. No scleral icterus.  Neck: Normal range of motion. Neck supple. No JVD present. No tracheal deviation present. No thyromegaly present.  Cardiovascular: Normal rate, regular rhythm, normal heart sounds and intact distal pulses.  Exam reveals no gallop and no friction rub.   No murmur heard. Pulmonary/Chest: Effort normal and breath sounds normal. No stridor. No respiratory distress. She has no wheezes. She has no rales. Chest wall is not dull to percussion. She exhibits no mass, no tenderness, no bony tenderness, no laceration, no crepitus, no edema, no deformity, no swelling and no retraction. Right breast exhibits no inverted nipple, no mass, no nipple discharge, no skin change and no tenderness. Left breast exhibits no inverted nipple, no mass, no nipple discharge, no skin change and no tenderness. Breasts are symmetrical.  Abdominal: Soft. Bowel sounds are normal. She exhibits no distension and no mass. There is no tenderness. There is no rebound and no guarding. Hernia confirmed negative in the right inguinal area and confirmed negative in the left inguinal area.  Genitourinary: Rectum normal, vagina normal and uterus normal. Rectal  exam shows no external hemorrhoid,  no internal hemorrhoid, no fissure, no mass, no tenderness and anal tone normal. Guaiac negative stool. No breast swelling, tenderness, discharge or bleeding. No labial fusion. There is no rash, tenderness, lesion or injury on the right labia. There is no rash, tenderness, lesion or injury on the left labia. Uterus is not deviated, not enlarged, not fixed and not tender. Cervix exhibits no motion tenderness, no discharge and no friability. Right adnexum displays no mass, no tenderness and no fullness. Left adnexum displays no mass, no tenderness and no fullness. No erythema, tenderness or bleeding around the vagina. No foreign body around the vagina. No signs of injury around the vagina. No vaginal discharge found.  Musculoskeletal: Normal range of motion. She exhibits no edema and no tenderness.  Lymphadenopathy:    She has no cervical adenopathy.       Right: No inguinal adenopathy present.       Left: No inguinal adenopathy present.  Neurological: She is oriented to person, place, and time.  Skin: Skin is warm and dry. No rash noted. She is not diaphoretic. No erythema. No pallor.  Psychiatric: She has a normal mood and affect. Her behavior is normal. Judgment and thought content normal.     Lab Results  Component Value Date   WBC 11.9* 09/30/2012   HGB 11.7* 09/30/2012   HCT 38.0 09/30/2012   PLT 325.0 09/30/2012   GLUCOSE 84 09/30/2012   CHOL 118 12/25/2011   TRIG 82.0 12/25/2011   HDL 43.40 12/25/2011   LDLCALC 58 12/25/2011   ALT 19 09/30/2012   AST 14 09/30/2012   NA 137 09/30/2012   K 3.8 09/30/2012   CL 104 09/30/2012   CREATININE 0.9 09/30/2012   BUN 10 09/30/2012   CO2 27 09/30/2012   TSH 0.71 09/30/2012   HGBA1C 6.0 09/20/2012       Assessment & Plan:

## 2013-01-12 NOTE — Patient Instructions (Signed)
Preventive Care for Adults, Female A healthy lifestyle and preventive care can promote health and wellness. Preventive health guidelines for women include the following key practices.  A routine yearly physical is a good way to check with your caregiver about your health and preventive screening. It is a chance to share any concerns and updates on your health, and to receive a thorough exam.  Visit your dentist for a routine exam and preventive care every 6 months. Brush your teeth twice a day and floss once a day. Good oral hygiene prevents tooth decay and gum disease.  The frequency of eye exams is based on your age, health, family medical history, use of contact lenses, and other factors. Follow your caregiver's recommendations for frequency of eye exams.  Eat a healthy diet. Foods like vegetables, fruits, whole grains, low-fat dairy products, and lean protein foods contain the nutrients you need without too many calories. Decrease your intake of foods high in solid fats, added sugars, and salt. Eat the right amount of calories for you.Get information about a proper diet from your caregiver, if necessary.  Regular physical exercise is one of the most important things you can do for your health. Most adults should get at least 150 minutes of moderate-intensity exercise (any activity that increases your heart rate and causes you to sweat) each week. In addition, most adults need muscle-strengthening exercises on 2 or more days a week.  Maintain a healthy weight. The body mass index (BMI) is a screening tool to identify possible weight problems. It provides an estimate of body fat based on height and weight. Your caregiver can help determine your BMI, and can help you achieve or maintain a healthy weight.For adults 20 years and older:  A BMI below 18.5 is considered underweight.  A BMI of 18.5 to 24.9 is normal.  A BMI of 25 to 29.9 is considered overweight.  A BMI of 30 and above is  considered obese.  Maintain normal blood lipids and cholesterol levels by exercising and minimizing your intake of saturated fat. Eat a balanced diet with plenty of fruit and vegetables. Blood tests for lipids and cholesterol should begin at age 20 and be repeated every 5 years. If your lipid or cholesterol levels are high, you are over 50, or you are at high risk for heart disease, you may need your cholesterol levels checked more frequently.Ongoing high lipid and cholesterol levels should be treated with medicines if diet and exercise are not effective.  If you smoke, find out from your caregiver how to quit. If you do not use tobacco, do not start.  If you are pregnant, do not drink alcohol. If you are breastfeeding, be very cautious about drinking alcohol. If you are not pregnant and choose to drink alcohol, do not exceed 1 drink per day. One drink is considered to be 12 ounces (355 mL) of beer, 5 ounces (148 mL) of wine, or 1.5 ounces (44 mL) of liquor.  Avoid use of street drugs. Do not share needles with anyone. Ask for help if you need support or instructions about stopping the use of drugs.  High blood pressure causes heart disease and increases the risk of stroke. Your blood pressure should be checked at least every 1 to 2 years. Ongoing high blood pressure should be treated with medicines if weight loss and exercise are not effective.  If you are 55 to 46 years old, ask your caregiver if you should take aspirin to prevent strokes.  Diabetes   screening involves taking a blood sample to check your fasting blood sugar level. This should be done once every 3 years, after age 45, if you are within normal weight and without risk factors for diabetes. Testing should be considered at a younger age or be carried out more frequently if you are overweight and have at least 1 risk factor for diabetes.  Breast cancer screening is essential preventive care for women. You should practice "breast  self-awareness." This means understanding the normal appearance and feel of your breasts and may include breast self-examination. Any changes detected, no matter how small, should be reported to a caregiver. Women in their 20s and 30s should have a clinical breast exam (CBE) by a caregiver as part of a regular health exam every 1 to 3 years. After age 40, women should have a CBE every year. Starting at age 40, women should consider having a mammography (breast X-ray test) every year. Women who have a family history of breast cancer should talk to their caregiver about genetic screening. Women at a high risk of breast cancer should talk to their caregivers about having magnetic resonance imaging (MRI) and a mammography every year.  The Pap test is a screening test for cervical cancer. A Pap test can show cell changes on the cervix that might become cervical cancer if left untreated. A Pap test is a procedure in which cells are obtained and examined from the lower end of the uterus (cervix).  Women should have a Pap test starting at age 21.  Between ages 21 and 29, Pap tests should be repeated every 2 years.  Beginning at age 30, you should have a Pap test every 3 years as long as the past 3 Pap tests have been normal.  Some women have medical problems that increase the chance of getting cervical cancer. Talk to your caregiver about these problems. It is especially important to talk to your caregiver if a new problem develops soon after your last Pap test. In these cases, your caregiver may recommend more frequent screening and Pap tests.  The above recommendations are the same for women who have or have not gotten the vaccine for human papillomavirus (HPV).  If you had a hysterectomy for a problem that was not cancer or a condition that could lead to cancer, then you no longer need Pap tests. Even if you no longer need a Pap test, a regular exam is a good idea to make sure no other problems are  starting.  If you are between ages 65 and 70, and you have had normal Pap tests going back 10 years, you no longer need Pap tests. Even if you no longer need a Pap test, a regular exam is a good idea to make sure no other problems are starting.  If you have had past treatment for cervical cancer or a condition that could lead to cancer, you need Pap tests and screening for cancer for at least 20 years after your treatment.  If Pap tests have been discontinued, risk factors (such as a new sexual partner) need to be reassessed to determine if screening should be resumed.  The HPV test is an additional test that may be used for cervical cancer screening. The HPV test looks for the virus that can cause the cell changes on the cervix. The cells collected during the Pap test can be tested for HPV. The HPV test could be used to screen women aged 30 years and older, and should   be used in women of any age who have unclear Pap test results. After the age of 30, women should have HPV testing at the same frequency as a Pap test.  Colorectal cancer can be detected and often prevented. Most routine colorectal cancer screening begins at the age of 50 and continues through age 75. However, your caregiver may recommend screening at an earlier age if you have risk factors for colon cancer. On a yearly basis, your caregiver may provide home test kits to check for hidden blood in the stool. Use of a small camera at the end of a tube, to directly examine the colon (sigmoidoscopy or colonoscopy), can detect the earliest forms of colorectal cancer. Talk to your caregiver about this at age 50, when routine screening begins. Direct examination of the colon should be repeated every 5 to 10 years through age 75, unless early forms of pre-cancerous polyps or small growths are found.  Hepatitis C blood testing is recommended for all people born from 1945 through 1965 and any individual with known risks for hepatitis C.  Practice  safe sex. Use condoms and avoid high-risk sexual practices to reduce the spread of sexually transmitted infections (STIs). STIs include gonorrhea, chlamydia, syphilis, trichomonas, herpes, HPV, and human immunodeficiency virus (HIV). Herpes, HIV, and HPV are viral illnesses that have no cure. They can result in disability, cancer, and death. Sexually active women aged 25 and younger should be checked for chlamydia. Older women with new or multiple partners should also be tested for chlamydia. Testing for other STIs is recommended if you are sexually active and at increased risk.  Osteoporosis is a disease in which the bones lose minerals and strength with aging. This can result in serious bone fractures. The risk of osteoporosis can be identified using a bone density scan. Women ages 65 and over and women at risk for fractures or osteoporosis should discuss screening with their caregivers. Ask your caregiver whether you should take a calcium supplement or vitamin D to reduce the rate of osteoporosis.  Menopause can be associated with physical symptoms and risks. Hormone replacement therapy is available to decrease symptoms and risks. You should talk to your caregiver about whether hormone replacement therapy is right for you.  Use sunscreen with sun protection factor (SPF) of 30 or more. Apply sunscreen liberally and repeatedly throughout the day. You should seek shade when your shadow is shorter than you. Protect yourself by wearing long sleeves, pants, a wide-brimmed hat, and sunglasses year round, whenever you are outdoors.  Once a month, do a whole body skin exam, using a mirror to look at the skin on your back. Notify your caregiver of new moles, moles that have irregular borders, moles that are larger than a pencil eraser, or moles that have changed in shape or color.  Stay current with required immunizations.  Influenza. You need a dose every fall (or winter). The composition of the flu vaccine  changes each year, so being vaccinated once is not enough.  Pneumococcal polysaccharide. You need 1 to 2 doses if you smoke cigarettes or if you have certain chronic medical conditions. You need 1 dose at age 65 (or older) if you have never been vaccinated.  Tetanus, diphtheria, pertussis (Tdap, Td). Get 1 dose of Tdap vaccine if you are younger than age 65, are over 65 and have contact with an infant, are a healthcare worker, are pregnant, or simply want to be protected from whooping cough. After that, you need a Td   booster dose every 10 years. Consult your caregiver if you have not had at least 3 tetanus and diphtheria-containing shots sometime in your life or have a deep or dirty wound.  HPV. You need this vaccine if you are a woman age 26 or younger. The vaccine is given in 3 doses over 6 months.  Measles, mumps, rubella (MMR). You need at least 1 dose of MMR if you were born in 1957 or later. You may also need a second dose.  Meningococcal. If you are age 19 to 21 and a first-year college student living in a residence hall, or have one of several medical conditions, you need to get vaccinated against meningococcal disease. You may also need additional booster doses.  Zoster (shingles). If you are age 60 or older, you should get this vaccine.  Varicella (chickenpox). If you have never had chickenpox or you were vaccinated but received only 1 dose, talk to your caregiver to find out if you need this vaccine.  Hepatitis A. You need this vaccine if you have a specific risk factor for hepatitis A virus infection or you simply wish to be protected from this disease. The vaccine is usually given as 2 doses, 6 to 18 months apart.  Hepatitis B. You need this vaccine if you have a specific risk factor for hepatitis B virus infection or you simply wish to be protected from this disease. The vaccine is given in 3 doses, usually over 6 months. Preventive Services / Frequency Ages 19 to 39  Blood  pressure check.** / Every 1 to 2 years.  Lipid and cholesterol check.** / Every 5 years beginning at age 20.  Clinical breast exam.** / Every 3 years for women in their 20s and 30s.  Pap test.** / Every 2 years from ages 21 through 29. Every 3 years starting at age 30 through age 65 or 70 with a history of 3 consecutive normal Pap tests.  HPV screening.** / Every 3 years from ages 30 through ages 65 to 70 with a history of 3 consecutive normal Pap tests.  Hepatitis C blood test.** / For any individual with known risks for hepatitis C.  Skin self-exam. / Monthly.  Influenza immunization.** / Every year.  Pneumococcal polysaccharide immunization.** / 1 to 2 doses if you smoke cigarettes or if you have certain chronic medical conditions.  Tetanus, diphtheria, pertussis (Tdap, Td) immunization. / A one-time dose of Tdap vaccine. After that, you need a Td booster dose every 10 years.  HPV immunization. / 3 doses over 6 months, if you are 26 and younger.  Measles, mumps, rubella (MMR) immunization. / You need at least 1 dose of MMR if you were born in 1957 or later. You may also need a second dose.  Meningococcal immunization. / 1 dose if you are age 19 to 21 and a first-year college student living in a residence hall, or have one of several medical conditions, you need to get vaccinated against meningococcal disease. You may also need additional booster doses.  Varicella immunization.** / Consult your caregiver.  Hepatitis A immunization.** / Consult your caregiver. 2 doses, 6 to 18 months apart.  Hepatitis B immunization.** / Consult your caregiver. 3 doses usually over 6 months. Ages 40 to 64  Blood pressure check.** / Every 1 to 2 years.  Lipid and cholesterol check.** / Every 5 years beginning at age 20.  Clinical breast exam.** / Every year after age 40.  Mammogram.** / Every year beginning at age 40   and continuing for as long as you are in good health. Consult with your  caregiver.  Pap test.** / Every 3 years starting at age 30 through age 65 or 70 with a history of 3 consecutive normal Pap tests.  HPV screening.** / Every 3 years from ages 30 through ages 65 to 70 with a history of 3 consecutive normal Pap tests.  Fecal occult blood test (FOBT) of stool. / Every year beginning at age 50 and continuing until age 75. You may not need to do this test if you get a colonoscopy every 10 years.  Flexible sigmoidoscopy or colonoscopy.** / Every 5 years for a flexible sigmoidoscopy or every 10 years for a colonoscopy beginning at age 50 and continuing until age 75.  Hepatitis C blood test.** / For all people born from 1945 through 1965 and any individual with known risks for hepatitis C.  Skin self-exam. / Monthly.  Influenza immunization.** / Every year.  Pneumococcal polysaccharide immunization.** / 1 to 2 doses if you smoke cigarettes or if you have certain chronic medical conditions.  Tetanus, diphtheria, pertussis (Tdap, Td) immunization.** / A one-time dose of Tdap vaccine. After that, you need a Td booster dose every 10 years.  Measles, mumps, rubella (MMR) immunization. / You need at least 1 dose of MMR if you were born in 1957 or later. You may also need a second dose.  Varicella immunization.** / Consult your caregiver.  Meningococcal immunization.** / Consult your caregiver.  Hepatitis A immunization.** / Consult your caregiver. 2 doses, 6 to 18 months apart.  Hepatitis B immunization.** / Consult your caregiver. 3 doses, usually over 6 months. Ages 65 and over  Blood pressure check.** / Every 1 to 2 years.  Lipid and cholesterol check.** / Every 5 years beginning at age 20.  Clinical breast exam.** / Every year after age 40.  Mammogram.** / Every year beginning at age 40 and continuing for as long as you are in good health. Consult with your caregiver.  Pap test.** / Every 3 years starting at age 30 through age 65 or 70 with a 3  consecutive normal Pap tests. Testing can be stopped between 65 and 70 with 3 consecutive normal Pap tests and no abnormal Pap or HPV tests in the past 10 years.  HPV screening.** / Every 3 years from ages 30 through ages 65 or 70 with a history of 3 consecutive normal Pap tests. Testing can be stopped between 65 and 70 with 3 consecutive normal Pap tests and no abnormal Pap or HPV tests in the past 10 years.  Fecal occult blood test (FOBT) of stool. / Every year beginning at age 50 and continuing until age 75. You may not need to do this test if you get a colonoscopy every 10 years.  Flexible sigmoidoscopy or colonoscopy.** / Every 5 years for a flexible sigmoidoscopy or every 10 years for a colonoscopy beginning at age 50 and continuing until age 75.  Hepatitis C blood test.** / For all people born from 1945 through 1965 and any individual with known risks for hepatitis C.  Osteoporosis screening.** / A one-time screening for women ages 65 and over and women at risk for fractures or osteoporosis.  Skin self-exam. / Monthly.  Influenza immunization.** / Every year.  Pneumococcal polysaccharide immunization.** / 1 dose at age 65 (or older) if you have never been vaccinated.  Tetanus, diphtheria, pertussis (Tdap, Td) immunization. / A one-time dose of Tdap vaccine if you are over   65 and have contact with an infant, are a healthcare worker, or simply want to be protected from whooping cough. After that, you need a Td booster dose every 10 years.  Varicella immunization.** / Consult your caregiver.  Meningococcal immunization.** / Consult your caregiver.  Hepatitis A immunization.** / Consult your caregiver. 2 doses, 6 to 18 months apart.  Hepatitis B immunization.** / Check with your caregiver. 3 doses, usually over 6 months. ** Family history and personal history of risk and conditions may change your caregiver's recommendations. Document Released: 09/16/2001 Document Revised: 10/13/2011  Document Reviewed: 12/16/2010 [employer] Patient Information 2014 [employer].  

## 2013-01-13 NOTE — Assessment & Plan Note (Signed)
Audiology referral.

## 2013-01-13 NOTE — Assessment & Plan Note (Signed)
She will try zyrtec

## 2013-01-13 NOTE — Assessment & Plan Note (Signed)
Her BP is well controlled Today I will check her lytes and renal function 

## 2013-01-13 NOTE — Assessment & Plan Note (Signed)
Exam done Mammogram was ordered Labs ordered Pt ed material was given

## 2013-01-18 ENCOUNTER — Ambulatory Visit: Payer: Federal, State, Local not specified - PPO

## 2013-01-24 ENCOUNTER — Encounter: Payer: Self-pay | Admitting: Internal Medicine

## 2013-01-25 ENCOUNTER — Other Ambulatory Visit: Payer: Self-pay | Admitting: Internal Medicine

## 2013-01-25 DIAGNOSIS — N920 Excessive and frequent menstruation with regular cycle: Secondary | ICD-10-CM

## 2013-01-27 ENCOUNTER — Ambulatory Visit (INDEPENDENT_AMBULATORY_CARE_PROVIDER_SITE_OTHER): Payer: Federal, State, Local not specified - PPO

## 2013-01-27 DIAGNOSIS — E538 Deficiency of other specified B group vitamins: Secondary | ICD-10-CM

## 2013-01-27 DIAGNOSIS — D518 Other vitamin B12 deficiency anemias: Secondary | ICD-10-CM

## 2013-01-27 MED ORDER — CYANOCOBALAMIN 1000 MCG/ML IJ SOLN
1000.0000 ug | Freq: Once | INTRAMUSCULAR | Status: AC
  Administered 2013-04-19: 1000 ug via INTRAMUSCULAR

## 2013-02-08 ENCOUNTER — Other Ambulatory Visit: Payer: Self-pay | Admitting: Cardiovascular Disease

## 2013-02-10 ENCOUNTER — Ambulatory Visit: Payer: Federal, State, Local not specified - PPO

## 2013-02-10 ENCOUNTER — Telehealth: Payer: Self-pay

## 2013-02-10 ENCOUNTER — Ambulatory Visit (INDEPENDENT_AMBULATORY_CARE_PROVIDER_SITE_OTHER): Payer: Federal, State, Local not specified - PPO

## 2013-02-10 DIAGNOSIS — D518 Other vitamin B12 deficiency anemias: Secondary | ICD-10-CM

## 2013-02-10 DIAGNOSIS — D519 Vitamin B12 deficiency anemia, unspecified: Secondary | ICD-10-CM

## 2013-02-10 DIAGNOSIS — E669 Obesity, unspecified: Secondary | ICD-10-CM

## 2013-02-10 MED ORDER — PHENTERMINE HCL 37.5 MG PO CAPS: 37.5000 mg | ORAL_CAPSULE | ORAL | Status: DC

## 2013-02-10 NOTE — Telephone Encounter (Signed)
Patient stopped by for B12 injection and while here, asked if MD would send a Rx for phentermine with a few refills to her pharmacy. She wanted to inform MD that she has gained weight since surgery, even with trying to exercise.

## 2013-02-10 NOTE — Telephone Encounter (Signed)
done

## 2013-02-10 NOTE — Telephone Encounter (Signed)
Pt.notified

## 2013-02-17 LAB — HM PAP SMEAR: HM Pap smear: NORMAL

## 2013-02-23 ENCOUNTER — Encounter: Payer: Self-pay | Admitting: Family Medicine

## 2013-02-23 ENCOUNTER — Ambulatory Visit (INDEPENDENT_AMBULATORY_CARE_PROVIDER_SITE_OTHER): Payer: Federal, State, Local not specified - PPO | Admitting: Family Medicine

## 2013-02-23 ENCOUNTER — Other Ambulatory Visit (HOSPITAL_COMMUNITY)
Admission: RE | Admit: 2013-02-23 | Discharge: 2013-02-23 | Disposition: A | Discharge status: Home / Self Care | Payer: Federal, State, Local not specified - PPO | Source: Ambulatory Visit | Attending: Family Medicine | Admitting: Family Medicine

## 2013-02-23 ENCOUNTER — Encounter: Payer: Self-pay | Admitting: Obstetrics & Gynecology

## 2013-02-23 VITALS — BP 123/88 | HR 110 | Temp 98.3°F | Resp 20 | Ht 69.0 in | Wt 210.3 lb

## 2013-02-23 DIAGNOSIS — N898 Other specified noninflammatory disorders of vagina: Secondary | ICD-10-CM

## 2013-02-23 DIAGNOSIS — L708 Other acne: Secondary | ICD-10-CM

## 2013-02-23 DIAGNOSIS — N920 Excessive and frequent menstruation with regular cycle: Secondary | ICD-10-CM | POA: Insufficient documentation

## 2013-02-23 DIAGNOSIS — L709 Acne, unspecified: Secondary | ICD-10-CM

## 2013-02-23 NOTE — Progress Notes (Signed)
Pt wants evaluation and treatment for heavy periods which she states she has always had.

## 2013-02-23 NOTE — Patient Instructions (Addendum)

## 2013-02-23 NOTE — Progress Notes (Signed)
Patient ID: Mary Sanford, female   DOB: 1966/04/19, 47 y.o.   MRN: [id number]   HPI:  47 y.o. [id number] presenting with heavy periods. Menarche age 74, periods regular, q 26-28 days, last 7 days, first 3 days are very heavy, changing super pad/tampon q 2 hours or will overflow. Occasional dysmenorrhea. Ultrasound in 2010 showed one small fibroid. Mom and all sisters had heavy periods and several have had endometrial ablation that worked well for them. She is finished with childbearing, s/p BTL in 2001. No irregular spotting or bleeding. No dysuria. No pelvic pain or dyspareunia. Has had vaginal discharge with odor.   Past Medical History  Diagnosis Date  . Anemia   . Hypertension   . Kidney stones     Past Surgical History  Procedure Laterality Date  . Caesarean section    . Cystoscopic      extraction of ureteric calculus without disintegration  . Hemorrhoidectomy with hemorrhoid banding  10/19/2012    SCA THD Hem ligation/pexy  . Tubal ligation    . Wisdom tooth extraction      SOC:  Quit smoking 2001  Current Outpatient Prescriptions on File Prior to Visit  Medication Sig Dispense Refill  . cyanocobalamin (,VITAMIN B-12,) 1000 MCG/ML injection Inject 1 mL (1,000 mcg total) into the muscle every 30 (thirty) days.  10 mL  0  . hydrochlorothiazide (HYDRODIURIL) 25 MG tablet TAKE ONE TABLET BY MOUTH ONCE DAILY  30 tablet  0  . phentermine 37.5 MG capsule Take 1 capsule (37.5 mg total) by mouth every morning.  30 capsule  2  . potassium chloride (K-DUR) 10 MEQ tablet Take 1 tablet (10 mEq total) by mouth 2 (two) times daily.  60 tablet  11  . cetirizine (ZYRTEC) 10 MG tablet Take 1 tablet (10 mg total) by mouth daily.  30 tablet  11  . oxyCODONE (OXY IR/ROXICODONE) 5 MG immediate release tablet        Current Facility-Administered Medications on File Prior to Visit  Medication Dose Route Frequency Provider Last Rate Last Dose  . cyanocobalamin ((VITAMIN B-12)) injection 1,000 mcg   1,000 mcg Intramuscular Q14 Days Etta Grandchild, MD   1,000 mcg at 02/10/13 1426  . cyanocobalamin ((VITAMIN B-12)) injection 1,000 mcg  1,000 mcg Intramuscular Once Etta Grandchild, MD       ALLERGY:  Naproxen (anxiety/panic attack)  ROS:  See HPI  Filed Vitals:   02/23/13 1415  BP: 123/88  Pulse: 110  Temp: 98.3 F (36.8 C)  Resp: 20   EXAM GEN:  WNWD, no distress HEENT:  NCAT, EOMI, conjunctiva clear CV: RRR, no murmur RESP:  CTAB ABD:  Soft, non-tender, no guarding or rebound, normal bowel sounds EXTREM:  Warm, well perfused, no edema or tenderness NEURO:  Alert, oriented, no focal deficits GU:  Normal external genitalia and vagina, mild milky white discharge. Normal cervix, no CMT, no adnexal masses or tenderness.   ENDOMETRIAL BIOPSY     The indications for endometrial biopsy were reviewed.   Risks of the biopsy including cramping, bleeding, infection, uterine perforation, inadequate specimen and need for additional procedures  were discussed. The patient states she understands and agrees to undergo procedure today. Consent was signed. Time out was performed. Urine HCG was negative. A sterile speculum was placed in the patient's vagina and the cervix was prepped with Betadine. A single-toothed tenaculum was placed on the anterior lip of the cervix to stabilize it. The 3 mm pipelle was introduced  into the endometrial cavity without difficulty to a depth of 9.5 cm, and a moderate amount of tissue was obtained and sent to pathology. The instruments were removed from the patient's vagina. Minimal bleeding from the cervix was noted. The patient tolerated the procedure well. Routine post-procedure instructions were given to the patient. The patient will be called with the results and given recommendations for further management.    A/P 47 y.o. [id number] with menorrhagia - EMB performed, anticipate benign findings - Repeat pelvic ultrasound to look for increased size or number of  fibroids - Discussed options:  OCPs, mirena, endometrial ablation or hysterectomy.  Pt prefers ablation as it is less invasive, potentially more permanent and has worked for family members.  - F/U in 2 weeks with surgeon - F/U wet prep results and treat as indicated  Napoleon Form, MD

## 2013-02-24 ENCOUNTER — Ambulatory Visit: Payer: Federal, State, Local not specified - PPO

## 2013-02-24 LAB — WET PREP, GENITAL: Clue Cells Wet Prep HPF POC: NONE SEEN

## 2013-02-25 ENCOUNTER — Ambulatory Visit (INDEPENDENT_AMBULATORY_CARE_PROVIDER_SITE_OTHER): Payer: Federal, State, Local not specified - PPO

## 2013-02-25 DIAGNOSIS — D519 Vitamin B12 deficiency anemia, unspecified: Secondary | ICD-10-CM

## 2013-02-25 DIAGNOSIS — D518 Other vitamin B12 deficiency anemias: Secondary | ICD-10-CM

## 2013-02-25 MED ORDER — CYANOCOBALAMIN 1000 MCG/ML IJ SOLN: 1000.0000 ug | Freq: Once | INTRAMUSCULAR | Status: DC

## 2013-02-25 MED ORDER — CYANOCOBALAMIN 1000 MCG/ML IJ SOLN
1000.0000 ug | Freq: Once | INTRAMUSCULAR | Status: AC
  Administered 2013-02-25: 1000 ug via INTRAMUSCULAR

## 2013-02-28 ENCOUNTER — Ambulatory Visit (HOSPITAL_COMMUNITY)
Admission: RE | Admit: 2013-02-28 | Discharge: 2013-02-28 | Disposition: A | Discharge status: Home / Self Care | Payer: Federal, State, Local not specified - PPO | Source: Ambulatory Visit | Attending: Family Medicine | Admitting: Family Medicine

## 2013-02-28 ENCOUNTER — Ambulatory Visit (HOSPITAL_COMMUNITY): Admission: RE | Admit: 2013-02-28 | Payer: Federal, State, Local not specified - PPO | Source: Ambulatory Visit

## 2013-02-28 DIAGNOSIS — N920 Excessive and frequent menstruation with regular cycle: Secondary | ICD-10-CM | POA: Insufficient documentation

## 2013-02-28 DIAGNOSIS — D649 Anemia, unspecified: Secondary | ICD-10-CM | POA: Insufficient documentation

## 2013-02-28 DIAGNOSIS — D259 Leiomyoma of uterus, unspecified: Secondary | ICD-10-CM | POA: Insufficient documentation

## 2013-03-08 ENCOUNTER — Ambulatory Visit: Payer: Federal, State, Local not specified - PPO

## 2013-03-08 ENCOUNTER — Ambulatory Visit (INDEPENDENT_AMBULATORY_CARE_PROVIDER_SITE_OTHER): Payer: Federal, State, Local not specified - PPO

## 2013-03-08 DIAGNOSIS — D518 Other vitamin B12 deficiency anemias: Secondary | ICD-10-CM

## 2013-03-08 DIAGNOSIS — D519 Vitamin B12 deficiency anemia, unspecified: Secondary | ICD-10-CM

## 2013-03-08 MED ORDER — CYANOCOBALAMIN 1000 MCG/ML IJ SOLN
1000.0000 ug | Freq: Once | INTRAMUSCULAR | Status: AC
  Administered 2013-03-08: 1000 ug via INTRAMUSCULAR

## 2013-03-11 ENCOUNTER — Other Ambulatory Visit: Payer: Self-pay | Admitting: Cardiovascular Disease

## 2013-03-22 ENCOUNTER — Ambulatory Visit (INDEPENDENT_AMBULATORY_CARE_PROVIDER_SITE_OTHER): Payer: Federal, State, Local not specified - PPO

## 2013-03-22 DIAGNOSIS — D518 Other vitamin B12 deficiency anemias: Secondary | ICD-10-CM

## 2013-03-22 DIAGNOSIS — D519 Vitamin B12 deficiency anemia, unspecified: Secondary | ICD-10-CM

## 2013-03-22 MED ORDER — CYANOCOBALAMIN 1000 MCG/ML IJ SOLN
1000.0000 ug | Freq: Once | INTRAMUSCULAR | Status: AC
  Administered 2013-03-22: 1000 ug via INTRAMUSCULAR

## 2013-03-30 ENCOUNTER — Ambulatory Visit: Payer: Federal, State, Local not specified - PPO | Admitting: Obstetrics & Gynecology

## 2013-03-30 ENCOUNTER — Ambulatory Visit: Payer: Federal, State, Local not specified - PPO | Admitting: Internal Medicine

## 2013-04-05 ENCOUNTER — Ambulatory Visit (INDEPENDENT_AMBULATORY_CARE_PROVIDER_SITE_OTHER): Payer: Federal, State, Local not specified - PPO | Admitting: *Deleted

## 2013-04-05 DIAGNOSIS — D519 Vitamin B12 deficiency anemia, unspecified: Secondary | ICD-10-CM

## 2013-04-05 DIAGNOSIS — D518 Other vitamin B12 deficiency anemias: Secondary | ICD-10-CM

## 2013-04-05 MED ORDER — CYANOCOBALAMIN 1000 MCG/ML IJ SOLN
1000.0000 ug | Freq: Once | INTRAMUSCULAR | Status: AC
  Administered 2013-04-05: 1000 ug via INTRAMUSCULAR

## 2013-04-06 ENCOUNTER — Other Ambulatory Visit: Payer: Self-pay | Admitting: *Deleted

## 2013-04-19 ENCOUNTER — Ambulatory Visit (INDEPENDENT_AMBULATORY_CARE_PROVIDER_SITE_OTHER): Payer: Federal, State, Local not specified - PPO

## 2013-04-19 ENCOUNTER — Ambulatory Visit: Payer: Federal, State, Local not specified - PPO | Admitting: Internal Medicine

## 2013-04-19 DIAGNOSIS — E538 Deficiency of other specified B group vitamins: Secondary | ICD-10-CM

## 2013-04-19 DIAGNOSIS — D518 Other vitamin B12 deficiency anemias: Secondary | ICD-10-CM

## 2013-04-19 DIAGNOSIS — D519 Vitamin B12 deficiency anemia, unspecified: Secondary | ICD-10-CM

## 2013-05-03 ENCOUNTER — Ambulatory Visit: Payer: Federal, State, Local not specified - PPO

## 2013-05-04 ENCOUNTER — Ambulatory Visit (INDEPENDENT_AMBULATORY_CARE_PROVIDER_SITE_OTHER): Payer: Federal, State, Local not specified - PPO

## 2013-05-04 DIAGNOSIS — D518 Other vitamin B12 deficiency anemias: Secondary | ICD-10-CM

## 2013-05-04 DIAGNOSIS — D519 Vitamin B12 deficiency anemia, unspecified: Secondary | ICD-10-CM

## 2013-05-18 ENCOUNTER — Ambulatory Visit (INDEPENDENT_AMBULATORY_CARE_PROVIDER_SITE_OTHER): Payer: Federal, State, Local not specified - PPO

## 2013-05-18 DIAGNOSIS — D518 Other vitamin B12 deficiency anemias: Secondary | ICD-10-CM

## 2013-05-18 DIAGNOSIS — D519 Vitamin B12 deficiency anemia, unspecified: Secondary | ICD-10-CM

## 2013-05-25 ENCOUNTER — Ambulatory Visit (INDEPENDENT_AMBULATORY_CARE_PROVIDER_SITE_OTHER): Payer: Federal, State, Local not specified - PPO | Admitting: Obstetrics & Gynecology

## 2013-05-25 ENCOUNTER — Encounter: Payer: Self-pay | Admitting: Obstetrics & Gynecology

## 2013-05-25 VITALS — BP 130/88 | HR 86 | Temp 97.1°F | Resp 20 | Ht 69.0 in | Wt 218.1 lb

## 2013-05-25 DIAGNOSIS — N920 Excessive and frequent menstruation with regular cycle: Secondary | ICD-10-CM

## 2013-05-25 MED ORDER — MEGESTROL ACETATE 40 MG PO TABS: 40.0000 mg | ORAL_TABLET | Freq: Two times a day (BID) | ORAL | Status: DC

## 2013-05-25 NOTE — Patient Instructions (Signed)
Endometrial Ablation Endometrial ablation removes the lining of the uterus (endometrium). It is usually a same day, outpatient treatment. Ablation helps avoid major surgery (such as a hysterectomy). A hysterectomy is removal of the cervix and uterus. Endometrial ablation has less risk and complications, has a shorter recovery period and is less expensive. After endometrial ablation, most women will have little or no menstrual bleeding. You may not keep your fertility. Pregnancy is no longer likely after this procedure but if you are pre-menopausal, you still need to use a reliable method of birth control following the procedure because pregnancy can occur. REASONS TO HAVE THE PROCEDURE MAY INCLUDE:  Heavy periods.  Bleeding that is causing anemia.  Anovulatory bleeding, very irregular, bleeding.  Bleeding submucous fibroids (on the lining inside the uterus) if they are smaller than 3 centimeters. REASONS NOT TO HAVE THE PROCEDURE MAY INCLUDE:  You wish to have more children.  You have a pre-cancerous or cancerous problem. The cause of any abnormal bleeding must be diagnosed before having the procedure.  You have pain coming from the uterus.  You have a submucus fibroid larger than 3 centimeters.  You recently had a baby.  You recently had an infection in the uterus.  You have a severe retro-flexed, tipped uterus and cannot insert the instrument to do the ablation.  You had a Cesarean section or deep major surgery on the uterus.  The inner cavity of the uterus is too large for the endometrial ablation instrument. RISKS AND COMPLICATIONS   Perforation of the uterus.  Bleeding.  Infection of the uterus, bladder or vagina.  Injury to surrounding organs.  Cutting the cervix.  An air bubble to the lung (air embolus).  Pregnancy following the procedure.  Failure of the procedure to help the problem requiring hysterectomy.  Decreased ability to diagnose cancer in the lining of  the uterus. BEFORE THE PROCEDURE  The lining of the uterus must be tested to make sure there is no pre-cancerous or cancer cells present.  Medications may be given to make the lining of the uterus thinner.  Ultrasound may be used to evaluate the size and look for abnormalities of the uterus.  Future pregnancy is not desired. PROCEDURE  There are different ways to destroy the lining of the uterus.   Resectoscope - radio frequency-alternating electric current is the most common one used.  Cryotherapy - freezing the lining of the uterus.  Heated Free Liquid - heated salt (saline) solution inserted into the uterus.  Microwave - uses high energy microwaves in the uterus.  Thermal Balloon - a catheter with a balloon tip is inserted into the uterus and filled with heated fluid. Your caregiver will talk with you about the method used in this clinic. They will also instruct you on the pros and cons of the procedure. Endometrial ablation is performed along with a procedure called operative hysteroscopy. A narrow viewing tube is inserted through the birth canal (vagina) and through the cervix into the uterus. A tiny camera attached to the viewing tube (hysteroscope) allows the uterine cavity to be shown on a TV monitor during surgery. Your uterus is filled with a harmless liquid to make the procedure easier. The lining of the uterus is then removed. The lining can also be removed with a resectoscope which allows your surgeon to cut away the lining of the uterus under direct vision. Usually, you will be able to go home within an hour after the procedure. HOME CARE INSTRUCTIONS   Do   not drive for 24 hours.  No tampons, douching or intercourse for 2 weeks or until your caregiver approves.  Rest at home for 24 to 48 hours. You may then resume normal activities unless told differently by your caregiver.  Take your temperature two times a day for 4 days, and record it.  Take any medications your  caregiver has ordered, as directed.  Use some form of contraception if you are pre-menopausal and do not want to get pregnant. Bleeding after the procedure is normal. It varies from light spotting and mildly watery to bloody discharge for 4 to 6 weeks. You may also have mild cramping. Only take over-the-counter or prescription medicines for pain, discomfort, or fever as directed by your caregiver. Do not use aspirin, as this may aggravate bleeding. Frequent urination during the first 24 hours is normal. You will not know how effective your surgery is until at least 3 months after the surgery. SEEK IMMEDIATE MEDICAL CARE IF:   Bleeding is heavier than a normal menstrual cycle.  An oral temperature above 102 F (38.9 C) develops.  You have increasing cramps or pains not relieved with medication or develop belly (abdominal) pain which does not seem to be related to the same area of earlier cramping and pain.  You are light headed, weak or have fainting episodes.  You develop pain in the shoulder strap areas.  You have chest or leg pain.  You have abnormal vaginal discharge.  You have painful urination. Document Released: 05/30/2004 Document Revised: 10/13/2011 Document Reviewed: 08/28/2007 Exit[employer] Patient Information 2014 [employer].  

## 2013-05-25 NOTE — Progress Notes (Signed)
GYNECOLOGY CLINIC ENCOUNTER NOTE  History:  47 y.o. [id number] here today for discussion of results for evaluation of menorrhagia done in 02/2013 and also discussion about endometrial abaltion. Still reports continued menorrhagia, no other concerns.  The following portions of the patient's history were reviewed and updated as appropriate: allergies, current medications, past family history, past medical history, past social history, past surgical history and problem list.  Review of Systems:  Pertinent items are noted in HPI.  Objective:  Physical Exam BP 130/88  Pulse 86  Temp(Src) 97.1 F (36.2 C)  Resp 20  Ht 5' 9" (1.753 m)  Wt 218 lb 1.6 oz (98.93 kg)  BMI 32.19 kg/m2  LMP 05/21/2013 Gen: NAD Abd: Soft, nontender and nondistended Pelvic: Deferred  Labs and Imaging 02/23/13  Endometrium, biopsy - BENIGN PROLIFERATIVE ENDOMETRIUM, NO ATYPIA, HYPERPLASIA OR MALIGNANCY.  02/28/13 TRANSABDOMINAL AND TRANSVAGINAL ULTRASOUND OF PELVIS Clinical Data: Menorrhagia. LMP 02/06/2013. Comparison: Pelvic ultrasound 11/24/2008.  Findings: Uterus: The uterus is anteverted and measures 9.6 x 5.3 x 6.9 cm. An intramural 0.8 x 0.6 x 0.8 cm fibroid is seen in the anterior fundus. In the left posterior uterine body is an intramural 0.9 x 0.7 x 0.9 cm fibroid.  Endometrium: Normal in thickness and appearance. Homogeneous in appearance and measures 9 mm. Right ovary: Normal appearance/no adnexal mass. Measures 4.4 x 2.1 x 3.1 cm.  Left ovary: Normal appearance/no adnexal mass. Measures 2.4 x 1.8 x 2.3 cm. Other findings: Small amount of free pelvic fluid is present. IMPRESSION: 1. Two small intrauterine fibroids as described above. 2. Normal endometrial thickness. 3. Normal ovaries.   Assessment & Plan:   Discussed management options for abnormal uterine bleeding including oral progesterone, Depo Provera, Mirena IUD, endometrial ablation (Novasure/Hydrothermal Ablation) or hysterectomy as definitive  surgical management.  Discussed risks and benefits of each method.  Printed patient education handouts were given to the patient to review at home.  Patient desires endometrial ablation with Novasure.  Risks of surgery were discussed with the patient including but not limited to: bleeding which may require transfusion; infection which may require antibiotics; injury to uterus leading to risk of injury to surrounding intraperitoneal organs, need for additional procedures including laparoscopy or laparotomy, and other postoperative/anesthesia complications.   Patient was told that the likelihood that her condition and symptoms will be treated effectively with this surgical management was very high; the postoperative expectations were also discussed in detail. The patient also understands the alternative treatment options which were discussed in full.   All questions were answered.  She was told that she will be contacted by our surgical scheduler regarding the time and date of her surgery; routine preoperative instructions of having nothing to eat or drink after midnight on the day prior to surgery and also coming to the hospital 1.5 hours prior to her time of surgery were also emphasized.  Printed patient education handouts about the procedure were given to the patient to review at home.   Megace taper prescribed as needed for now,  bleeding precautions reviewed.   Curry Seefeldt, MD, FACOG Attending Obstetrician & Gynecologist Faculty Practice, [facility]  

## 2013-05-27 ENCOUNTER — Telehealth: Payer: Self-pay | Admitting: Internal Medicine

## 2013-05-27 NOTE — Telephone Encounter (Signed)
Pt request result for pap smear back in June 2014. Pt stated that she still waiting for the result. Please advise.

## 2013-05-27 NOTE — Telephone Encounter (Signed)
An endometrial biopsy done by someone else was normal

## 2013-05-27 NOTE — Telephone Encounter (Signed)
Patient informed. 

## 2013-05-27 NOTE — Telephone Encounter (Signed)
Patient left voice mail sating that she had a pap done on 01/12/2013 and is still waiting for the result. I reviewed the chart and I do not see where the pap was ever resulted.   Please advise  Thanks!

## 2013-06-01 ENCOUNTER — Ambulatory Visit: Payer: Federal, State, Local not specified - PPO

## 2013-06-06 ENCOUNTER — Encounter (HOSPITAL_COMMUNITY): Payer: Self-pay | Admitting: Pharmacist

## 2013-06-09 ENCOUNTER — Other Ambulatory Visit: Payer: Self-pay

## 2013-06-09 ENCOUNTER — Encounter (HOSPITAL_COMMUNITY)
Admission: RE | Admit: 2013-06-09 | Discharge: 2013-06-09 | Disposition: A | Discharge status: Home / Self Care | Payer: Federal, State, Local not specified - PPO | Source: Ambulatory Visit | Attending: Obstetrics & Gynecology | Admitting: Obstetrics & Gynecology

## 2013-06-09 ENCOUNTER — Encounter (HOSPITAL_COMMUNITY): Payer: Self-pay

## 2013-06-09 DIAGNOSIS — Z01812 Encounter for preprocedural laboratory examination: Secondary | ICD-10-CM | POA: Insufficient documentation

## 2013-06-09 HISTORY — DX: Depression, unspecified: F32.A

## 2013-06-09 HISTORY — DX: Deficiency of other specified B group vitamins: E53.8

## 2013-06-09 HISTORY — DX: Major depressive disorder, single episode, unspecified: F32.9

## 2013-06-09 LAB — CBC
MCH: 21.8 pg — ABNORMAL LOW (ref 26.0–34.0)
MCHC: 33.2 g/dL (ref 30.0–36.0)
MCV: 65.7 fL — ABNORMAL LOW (ref 78.0–100.0)
Platelets: 281 10*3/uL (ref 150–400)
RDW: 15.1 % (ref 11.5–15.5)
WBC: 6.5 10*3/uL (ref 4.0–10.5)

## 2013-06-09 LAB — BASIC METABOLIC PANEL
Calcium: 10.2 mg/dL (ref 8.4–10.5)
Creatinine, Ser: 0.74 mg/dL (ref 0.50–1.10)
GFR calc Af Amer: >90 mL/min (ref >90)
GFR calc non Af Amer: >90 mL/min (ref >90)

## 2013-06-09 NOTE — Patient Instructions (Signed)
Your procedure is scheduled on: 06/17/13  Enter through the Main Entrance at :0845 Pick up desk phone and dial [phone] and inform us of your arrival.  Please call [phone] if you have any problems the morning of surgery.  Remember: Do not eat food or drink liquids, including water, after midnight:Thursday   You may brush your teeth the morning of surgery.  Take these meds the morning of surgery with a sip of water:BP med, potassium  DO NOT wear jewelry, eye make-up, lipstick,body lotion, or dark fingernail polish.  (Polished toes are ok) You may wear deodorant.  If you are to be admitted after surgery, leave suitcase in car until your room has been assigned. Patients discharged on the day of surgery will not be allowed to drive home. Wear loose fitting, comfortable clothes for your ride home.

## 2013-06-17 ENCOUNTER — Ambulatory Visit (HOSPITAL_COMMUNITY)
Admission: RE | Admit: 2013-06-17 | Discharge: 2013-06-17 | Disposition: A | Discharge status: Home / Self Care | Payer: Federal, State, Local not specified - PPO | Source: Ambulatory Visit | Attending: Obstetrics & Gynecology | Admitting: Obstetrics & Gynecology

## 2013-06-17 ENCOUNTER — Ambulatory Visit (HOSPITAL_COMMUNITY): Payer: Federal, State, Local not specified - PPO | Admitting: Anesthesiology

## 2013-06-17 ENCOUNTER — Encounter (HOSPITAL_COMMUNITY): Payer: Self-pay | Admitting: *Deleted

## 2013-06-17 ENCOUNTER — Encounter (HOSPITAL_COMMUNITY)
Admission: RE | Disposition: A | Discharge status: Home / Self Care | Payer: Self-pay | Source: Ambulatory Visit | Attending: Obstetrics & Gynecology

## 2013-06-17 ENCOUNTER — Encounter (HOSPITAL_COMMUNITY): Payer: Federal, State, Local not specified - PPO | Admitting: Anesthesiology

## 2013-06-17 DIAGNOSIS — N926 Irregular menstruation, unspecified: Secondary | ICD-10-CM

## 2013-06-17 DIAGNOSIS — D251 Intramural leiomyoma of uterus: Secondary | ICD-10-CM | POA: Insufficient documentation

## 2013-06-17 DIAGNOSIS — N920 Excessive and frequent menstruation with regular cycle: Secondary | ICD-10-CM | POA: Diagnosis present

## 2013-06-17 HISTORY — PX: DILITATION & CURRETTAGE/HYSTROSCOPY WITH NOVASURE ABLATION: SHX5568

## 2013-06-17 LAB — PREGNANCY, URINE: Preg Test, Ur: NEGATIVE

## 2013-06-17 SURGERY — DILATATION & CURETTAGE/HYSTEROSCOPY WITH NOVASURE ABLATION
Anesthesia: Choice | Site: Uterus | Wound class: Clean Contaminated

## 2013-06-17 MED ORDER — OXYCODONE-ACETAMINOPHEN 5-325 MG PO TABS: 1.0000 | ORAL_TABLET | Freq: Four times a day (QID) | ORAL | Status: DC | PRN

## 2013-06-17 MED ORDER — PROPOFOL 10 MG/ML IV EMUL
INTRAVENOUS | Status: AC
  Filled 2013-06-17: qty 20

## 2013-06-17 MED ORDER — FENTANYL CITRATE 0.05 MG/ML IJ SOLN
INTRAMUSCULAR | Status: DC | PRN
  Administered 2013-06-17: 100 ug via INTRAVENOUS

## 2013-06-17 MED ORDER — MIDAZOLAM HCL 2 MG/2ML IJ SOLN
INTRAMUSCULAR | Status: DC | PRN
  Administered 2013-06-17: 2 mg via INTRAVENOUS

## 2013-06-17 MED ORDER — FENTANYL CITRATE 0.05 MG/ML IJ SOLN: 25.0000 ug | INTRAMUSCULAR | Status: DC | PRN

## 2013-06-17 MED ORDER — LIDOCAINE HCL (CARDIAC) 20 MG/ML IV SOLN
INTRAVENOUS | Status: AC
  Filled 2013-06-17: qty 5

## 2013-06-17 MED ORDER — KETOROLAC TROMETHAMINE 30 MG/ML IJ SOLN
INTRAMUSCULAR | Status: AC
  Filled 2013-06-17: qty 1

## 2013-06-17 MED ORDER — LIDOCAINE HCL (CARDIAC) 20 MG/ML IV SOLN
INTRAVENOUS | Status: DC | PRN
  Administered 2013-06-17: 80 mg via INTRAVENOUS

## 2013-06-17 MED ORDER — ONDANSETRON HCL 4 MG/2ML IJ SOLN
INTRAMUSCULAR | Status: DC | PRN
  Administered 2013-06-17: 4 mg via INTRAVENOUS

## 2013-06-17 MED ORDER — LACTATED RINGERS IR SOLN
Status: DC | PRN
  Administered 2013-06-17: 1500 mL

## 2013-06-17 MED ORDER — OXYCODONE-ACETAMINOPHEN 5-325 MG PO TABS
ORAL_TABLET | ORAL | Status: AC
  Filled 2013-06-17: qty 1

## 2013-06-17 MED ORDER — LACTATED RINGERS IV SOLN
INTRAVENOUS | Status: DC
  Administered 2013-06-17 (×2): via INTRAVENOUS

## 2013-06-17 MED ORDER — PROPOFOL 10 MG/ML IV BOLUS
INTRAVENOUS | Status: DC | PRN
  Administered 2013-06-17: 200 mg via INTRAVENOUS

## 2013-06-17 MED ORDER — KETOROLAC TROMETHAMINE 30 MG/ML IJ SOLN
INTRAMUSCULAR | Status: DC | PRN
  Administered 2013-06-17: 30 mg via INTRAVENOUS

## 2013-06-17 MED ORDER — BUPIVACAINE HCL (PF) 0.5 % IJ SOLN
INTRAMUSCULAR | Status: AC
  Filled 2013-06-17: qty 30

## 2013-06-17 MED ORDER — DEXAMETHASONE SODIUM PHOSPHATE 10 MG/ML IJ SOLN
INTRAMUSCULAR | Status: DC | PRN
  Administered 2013-06-17: 5 mg via INTRAVENOUS

## 2013-06-17 MED ORDER — FENTANYL CITRATE 0.05 MG/ML IJ SOLN
INTRAMUSCULAR | Status: AC
  Filled 2013-06-17: qty 5

## 2013-06-17 MED ORDER — BUPIVACAINE HCL 0.5 % IJ SOLN
INTRAMUSCULAR | Status: DC | PRN
  Administered 2013-06-17: 30 mL

## 2013-06-17 MED ORDER — ONDANSETRON HCL 4 MG/2ML IJ SOLN
INTRAMUSCULAR | Status: AC
  Filled 2013-06-17: qty 2

## 2013-06-17 MED ORDER — DEXAMETHASONE SODIUM PHOSPHATE 10 MG/ML IJ SOLN
INTRAMUSCULAR | Status: AC
  Filled 2013-06-17: qty 1

## 2013-06-17 MED ORDER — OXYCODONE-ACETAMINOPHEN 5-325 MG PO TABS
1.0000 | ORAL_TABLET | ORAL | Status: DC | PRN
  Administered 2013-06-17: 1 via ORAL

## 2013-06-17 MED ORDER — MIDAZOLAM HCL 2 MG/2ML IJ SOLN
INTRAMUSCULAR | Status: AC
  Filled 2013-06-17: qty 2

## 2013-06-17 MED ORDER — DOCUSATE SODIUM 100 MG PO CAPS: 100.0000 mg | ORAL_CAPSULE | Freq: Two times a day (BID) | ORAL | Status: DC | PRN

## 2013-06-17 SURGICAL SUPPLY — 13 items
ABLATOR ENDOMETRIAL BIPOLAR (ABLATOR) ×2 IMPLANT
CATH ROBINSON RED A/P 16FR (CATHETERS) ×2 IMPLANT
CLOTH BEACON ORANGE TIMEOUT ST (SAFETY) ×2 IMPLANT
CONTAINER PREFILL 10% NBF 60ML (FORM) IMPLANT
DRESSING TELFA 8X3 (GAUZE/BANDAGES/DRESSINGS) ×2 IMPLANT
GLOVE BIO SURGEON STRL SZ7 (GLOVE) ×2 IMPLANT
GLOVE BIOGEL PI IND STRL 7.0 (GLOVE) ×1 IMPLANT
GLOVE BIOGEL PI INDICATOR 7.0 (GLOVE) ×1
GOWN STRL REIN XL XLG (GOWN DISPOSABLE) ×6 IMPLANT
PACK HYSTEROSCOPY LF (CUSTOM PROCEDURE TRAY) ×2 IMPLANT
PAD OB MATERNITY 4.3X12.25 (PERSONAL CARE ITEMS) ×2 IMPLANT
TOWEL OR 17X24 6PK STRL BLUE (TOWEL DISPOSABLE) ×4 IMPLANT
WATER STERILE IRR 1000ML POUR (IV SOLUTION) ×2 IMPLANT

## 2013-06-17 NOTE — Interval H&P Note (Signed)
History and Physical Interval Note  Mary Sanford  has presented today for surgery, with the diagnosis of ABNORMAL UTERINE BLEEDING AND MENORRHAGIA   The various methods of treatment have been discussed with the patient and family. After consideration of risks, benefits and other options for treatment, the patient has consented to  Procedure(s): DILATATION & CURETTAGE/HYSTEROSCOPY WITH NOVASURE ABLATION (N/A) as a surgical intervention .  The patient's history has been reviewed, patient examined, no change in status, stable for surgery.  I have reviewed the patient's chart and labs.  Questions were answered to the patient's satisfaction.  To OR when ready.  Tereso Newcomer, MD 06/17/2013 9:39 AM

## 2013-06-17 NOTE — Anesthesia Preprocedure Evaluation (Signed)
Anesthesia Evaluation  Patient identified by MRN, date of birth, ID band Patient awake    Reviewed: Allergy & Precautions, H&P , Patient's Chart, lab work & pertinent test results, reviewed documented beta blocker date and time   Airway Mallampati: II TM Distance: >3 FB Neck ROM: full    Dental no notable dental hx.    Pulmonary former smoker,  breath sounds clear to auscultation  Pulmonary exam normal       Cardiovascular hypertension, On Medications Rhythm:regular Rate:Normal     Neuro/Psych    GI/Hepatic   Endo/Other    Renal/GU      Musculoskeletal   Abdominal   Peds  Hematology   Anesthesia Other Findings   Reproductive/Obstetrics                           Anesthesia Physical Anesthesia Plan  ASA: II  Anesthesia Plan:    Post-op Pain Management:    Induction: Intravenous  Airway Management Planned: LMA  Additional Equipment:   Intra-op Plan:   Post-operative Plan:   Informed Consent: I have reviewed the patients History and Physical, chart, labs and discussed the procedure including the risks, benefits and alternatives for the proposed anesthesia with the patient or authorized representative who has indicated his/her understanding and acceptance.   Dental Advisory Given and Dental advisory given  Plan Discussed with: CRNA and Surgeon  Anesthesia Plan Comments:         Anesthesia Quick Evaluation

## 2013-06-17 NOTE — H&P (View-Only) (Signed)
GYNECOLOGY CLINIC ENCOUNTER NOTE  History:  47 y.o. [id number] here today for discussion of results for evaluation of menorrhagia done in 02/2013 and also discussion about endometrial abaltion. Still reports continued menorrhagia, no other concerns.  The following portions of the patient's history were reviewed and updated as appropriate: allergies, current medications, past family history, past medical history, past social history, past surgical history and problem list.  Review of Systems:  Pertinent items are noted in HPI.  Objective:  Physical Exam BP 130/88  Pulse 86  Temp(Src) 97.1 F (36.2 C)  Resp 20  Ht 5\' 9"  (1.753 m)  Wt 218 lb 1.6 oz (98.93 kg)  BMI 32.19 kg/m2  LMP 05/21/2013 Gen: NAD Abd: Soft, nontender and nondistended Pelvic: Deferred  Labs and Imaging 02/23/13  Endometrium, biopsy - BENIGN PROLIFERATIVE ENDOMETRIUM, NO ATYPIA, HYPERPLASIA OR MALIGNANCY.  02/28/13 TRANSABDOMINAL AND TRANSVAGINAL ULTRASOUND OF PELVIS Clinical Data: Menorrhagia. LMP 02/06/2013. Comparison: Pelvic ultrasound 11/24/2008.  Findings: Uterus: The uterus is anteverted and measures 9.6 x 5.3 x 6.9 cm. An intramural 0.8 x 0.6 x 0.8 cm fibroid is seen in the anterior fundus. In the left posterior uterine body is an intramural 0.9 x 0.7 x 0.9 cm fibroid.  Endometrium: Normal in thickness and appearance. Homogeneous in appearance and measures 9 mm. Right ovary: Normal appearance/no adnexal mass. Measures 4.4 x 2.1 x 3.1 cm.  Left ovary: Normal appearance/no adnexal mass. Measures 2.4 x 1.8 x 2.3 cm. Other findings: Small amount of free pelvic fluid is present. IMPRESSION: 1. Two small intrauterine fibroids as described above. 2. Normal endometrial thickness. 3. Normal ovaries.   Assessment & Plan:   Discussed management options for abnormal uterine bleeding including oral progesterone, Depo Provera, Mirena IUD, endometrial ablation (Novasure/Hydrothermal Ablation) or hysterectomy as definitive  surgical management.  Discussed risks and benefits of each method.  Printed patient education handouts were given to the patient to review at home.  Patient desires endometrial ablation with Novasure.  Risks of surgery were discussed with the patient including but not limited to: bleeding which may require transfusion; infection which may require antibiotics; injury to uterus leading to risk of injury to surrounding intraperitoneal organs, need for additional procedures including laparoscopy or laparotomy, and other postoperative/anesthesia complications.   Patient was told that the likelihood that her condition and symptoms will be treated effectively with this surgical management was very high; the postoperative expectations were also discussed in detail. The patient also understands the alternative treatment options which were discussed in full.   All questions were answered.  She was told that she will be contacted by our surgical scheduler regarding the time and date of her surgery; routine preoperative instructions of having nothing to eat or drink after midnight on the day prior to surgery and also coming to the hospital 1.5 hours prior to her time of surgery were also emphasized.  Printed patient education handouts about the procedure were given to the patient to review at home.   Megace taper prescribed as needed for now,  bleeding precautions reviewed.   Jaynie Collins, MD, FACOG Attending Obstetrician & Gynecologist Faculty Practice, [facility] [place]

## 2013-06-17 NOTE — Op Note (Signed)
PREOPERATIVE DIAGNOSIS:  Menorrhagia POSTOPERATIVE DIAGNOSIS: The same PROCEDURE:  Diagnostic hysteroscopy,  NovaSure Endometrial Ablation SURGEON:  Dr. Jaynie Collins  INDICATIONS: 47 y.o. [id number]  here for NovaSure Endometrial Ablation.  Risks of surgery were discussed with the patient including but not limited to: bleeding which may require transfusion; infection which may require antibiotics; injury to uterus leading to risk of injury to surrounding intraperitoneal organs, need for additional procedures including laparoscopy or laparotomy, and other postoperative/anesthesia complications. Written informed consent was obtained.   FINDINGS:  A 10 week size uterus.  Diffuse proliferative endometrium.  Normal ostia bilaterally.  ANESTHESIA:   General and paracervical block with 30 ml of 0.5% Marcaine INTRAVENOUS FLUIDS:  2400 ml of LR FLUID DEFICITS:  95 ml of Lactated Ringers ESTIMATED BLOOD LOSS:  10 ml SPECIMENS: Endometrial curettings sent to pathology COMPLICATIONS:  None immediate  PROCEDURE DETAILS:  The patient was taken to the operating room where general anesthesia was administered and was found to be adequate.  After an adequate timeout was performed, she was placed in the dorsal lithotomy position and examined; then prepped and draped in the sterile manner.   Her bladder was catheterized for an unmeasured amount of clear, yellow urine. A speculum was then placed in the patient's vagina and a single tooth tenaculum was applied to the anterior lip of the cervix.  A paracervical block using 0.5% Marcaine was administered. The sound was used to obtain the cervical and uterine cavity length measurements at 3.5 cm and 6.5 cm respectively; total sounding length 10 cm.  The cervix was dilated manually with Hegar dilators to accommodate the diagnostic hysteroscope.  Once the cervix was dilated, the hysteroscope was inserted under direct visualization.   The hysteroscope was also used to determine  the level of the internal os, and measurements were confirmed. The uterine cavity was carefully examined, both ostia were recognized, and diffusely proliferative endometrium was noted. The hysteroscope was removed and the cervix was further dilated to accommodate the NovaSure device.  The NovaSure device was inserted, and a cavity width of 4.5 cm was determined. Using a power of 161 watts, for 55 sec, the endometrial ablation was performed. The hysteroscope was then re-introduced into the uterine cavity, confirming complete ablation of the endometrium. The tenaculum was removed from the anterior lip of the cervix, and the vaginal speculum was removed after noting good hemostasis.  The patient tolerated the procedure well and was taken to the recovery area awake, extubated and in stable condition.  The patient will be discharged to home as per PACU criteria.  Routine postoperative instructions given.  She was prescribed Percocet and Colace.  She will follow up in the clinic on 07/18/13 for postoperative evaluation.  Jaynie Collins, MD, FACOG Attending Obstetrician & Gynecologist Faculty Practice, [facility] [place]

## 2013-06-17 NOTE — Transfer of Care (Signed)
Immediate Anesthesia Transfer of Care Note  Patient: Mary Sanford  Procedure(s) Performed: Procedure(s): DILATATION & CURETTAGE/HYSTEROSCOPY WITH NOVASURE ABLATION (N/A)  Patient Location: PACU  Anesthesia Type:General  Level of Consciousness: awake, alert  and oriented  Airway & Oxygen Therapy: Patient Spontanous Breathing and Patient connected to nasal cannula oxygen  Post-op Assessment: Report given to PACU RN and Post -op Vital signs reviewed and stable  Post vital signs: stable  Complications: No apparent anesthesia complications

## 2013-06-20 ENCOUNTER — Encounter (HOSPITAL_COMMUNITY): Payer: Self-pay | Admitting: Obstetrics & Gynecology

## 2013-06-20 NOTE — Anesthesia Postprocedure Evaluation (Signed)
  Anesthesia Post-op Note  Patient: Mary Sanford  Procedure(s) Performed: Procedure(s): DILATATION & CURETTAGE/HYSTEROSCOPY WITH NOVASURE ABLATION (N/A) Patient is awake and responsive. Pain and nausea are reasonably well controlled. Vital signs are stable and clinically acceptable. Oxygen saturation is clinically acceptable. There are no apparent anesthetic complications at this time. Patient is ready for discharge.

## 2013-06-27 ENCOUNTER — Other Ambulatory Visit: Payer: Self-pay | Admitting: Internal Medicine

## 2013-06-29 ENCOUNTER — Encounter: Payer: Self-pay | Admitting: *Deleted

## 2013-07-14 ENCOUNTER — Ambulatory Visit (INDEPENDENT_AMBULATORY_CARE_PROVIDER_SITE_OTHER): Payer: Federal, State, Local not specified - PPO

## 2013-07-14 DIAGNOSIS — D519 Vitamin B12 deficiency anemia, unspecified: Secondary | ICD-10-CM

## 2013-07-14 DIAGNOSIS — D518 Other vitamin B12 deficiency anemias: Secondary | ICD-10-CM

## 2013-07-18 ENCOUNTER — Ambulatory Visit (INDEPENDENT_AMBULATORY_CARE_PROVIDER_SITE_OTHER): Payer: Federal, State, Local not specified - PPO | Admitting: Obstetrics & Gynecology

## 2013-07-18 ENCOUNTER — Encounter: Payer: Self-pay | Admitting: Obstetrics & Gynecology

## 2013-07-18 VITALS — BP 128/92 | HR 92 | Ht 69.0 in | Wt 216.9 lb

## 2013-07-18 DIAGNOSIS — Z09 Encounter for follow-up examination after completed treatment for conditions other than malignant neoplasm: Secondary | ICD-10-CM

## 2013-07-18 NOTE — Patient Instructions (Signed)
Return to clinic for any scheduled appointments or for any gynecologic concerns as needed.   

## 2013-07-18 NOTE — Progress Notes (Signed)
  Subjective:     Mary Sanford is a 47 y.o.  [phone] female who presents to the clinic status post diagnostic hysteroscopy, NovaSure Endometrial Ablation on 06/17/13 for menorrhagia.  She reports having brown discharge occasionally, no active bleeding currently.  She feels she may have gotten her period a couple of weeks ago, it was very light.   Eating a regular diet without difficulty. Bowel movements are normal. The patient is not having any pain.  No other concerns.  The following portions of the patient's history were reviewed and updated as appropriate: allergies, current medications, past family history, past medical history, past social history, past surgical history and problem list.  Review of Systems Pertinent items are noted in HPI.    Objective:    BP 128/92  Pulse 92  Ht 5\' 9"  (1.753 m)  Wt 216 lb 14.4 oz (98.385 kg)  BMI 32.02 kg/m2 General:  alert, cooperative and no distress  Abdomen: soft, bowel sounds active, non-tender  Pelvic:   NEFG, brown discharge in vault. Nontender uterus and adnexa.     Assessment:    Doing well postoperatively. Operative findings and photo again reviewed.   Plan:   Patient told that ii is reassuring that her period was lighter, she should still wait for about three months to ascertain what her bleeding pattern would be after ablation.   Routine preventative health maintenance measures emphasized.   Jaynie Collins, MD, FACOG Attending Obstetrician & Gynecologist Faculty Practice, [facility] [place]

## 2013-08-03 ENCOUNTER — Ambulatory Visit (INDEPENDENT_AMBULATORY_CARE_PROVIDER_SITE_OTHER): Payer: Federal, State, Local not specified - PPO | Admitting: *Deleted

## 2013-08-03 DIAGNOSIS — D519 Vitamin B12 deficiency anemia, unspecified: Secondary | ICD-10-CM

## 2013-08-03 DIAGNOSIS — D518 Other vitamin B12 deficiency anemias: Secondary | ICD-10-CM

## 2013-08-03 MED ORDER — CYANOCOBALAMIN 1000 MCG/ML IJ SOLN
1000.0000 ug | Freq: Once | INTRAMUSCULAR | Status: AC
  Administered 2013-08-03: 1000 ug via INTRAMUSCULAR

## 2013-08-17 ENCOUNTER — Ambulatory Visit: Payer: Federal, State, Local not specified - PPO

## 2013-08-18 ENCOUNTER — Ambulatory Visit (INDEPENDENT_AMBULATORY_CARE_PROVIDER_SITE_OTHER): Payer: Federal, State, Local not specified - PPO | Admitting: *Deleted

## 2013-08-18 DIAGNOSIS — E538 Deficiency of other specified B group vitamins: Secondary | ICD-10-CM

## 2013-08-18 MED ORDER — CYANOCOBALAMIN 1000 MCG/ML IJ SOLN
1000.0000 ug | Freq: Once | INTRAMUSCULAR | Status: AC
  Administered 2013-08-18: 1000 ug via INTRAMUSCULAR

## 2013-08-19 ENCOUNTER — Other Ambulatory Visit: Payer: Self-pay | Admitting: Internal Medicine

## 2013-08-24 ENCOUNTER — Other Ambulatory Visit: Payer: Self-pay | Admitting: Internal Medicine

## 2013-09-01 ENCOUNTER — Ambulatory Visit (INDEPENDENT_AMBULATORY_CARE_PROVIDER_SITE_OTHER): Payer: Federal, State, Local not specified - PPO | Admitting: *Deleted

## 2013-09-01 DIAGNOSIS — D519 Vitamin B12 deficiency anemia, unspecified: Secondary | ICD-10-CM

## 2013-09-01 DIAGNOSIS — D518 Other vitamin B12 deficiency anemias: Secondary | ICD-10-CM

## 2013-09-01 MED ORDER — CYANOCOBALAMIN 1000 MCG/ML IJ SOLN
1000.0000 ug | Freq: Once | INTRAMUSCULAR | Status: AC
  Administered 2013-09-01: 1000 ug via INTRAMUSCULAR

## 2013-09-16 ENCOUNTER — Encounter: Payer: Self-pay | Admitting: Internal Medicine

## 2013-09-16 ENCOUNTER — Ambulatory Visit (INDEPENDENT_AMBULATORY_CARE_PROVIDER_SITE_OTHER): Payer: Federal, State, Local not specified - PPO | Admitting: Internal Medicine

## 2013-09-16 ENCOUNTER — Other Ambulatory Visit (INDEPENDENT_AMBULATORY_CARE_PROVIDER_SITE_OTHER): Payer: Federal, State, Local not specified - PPO

## 2013-09-16 VITALS — BP 122/78 | HR 88 | Temp 98.3°F | Resp 16 | Ht 69.0 in | Wt 222.0 lb

## 2013-09-16 DIAGNOSIS — I10 Essential (primary) hypertension: Secondary | ICD-10-CM

## 2013-09-16 DIAGNOSIS — D518 Other vitamin B12 deficiency anemias: Secondary | ICD-10-CM

## 2013-09-16 DIAGNOSIS — D519 Vitamin B12 deficiency anemia, unspecified: Secondary | ICD-10-CM

## 2013-09-16 DIAGNOSIS — E669 Obesity, unspecified: Secondary | ICD-10-CM

## 2013-09-16 DIAGNOSIS — D509 Iron deficiency anemia, unspecified: Secondary | ICD-10-CM

## 2013-09-16 DIAGNOSIS — E876 Hypokalemia: Secondary | ICD-10-CM

## 2013-09-16 LAB — IBC PANEL
Iron: 36 ug/dL — ABNORMAL LOW (ref 42–145)
SATURATION RATIOS: 7.7 % — AB (ref 20.0–50.0)
TRANSFERRIN: 333.1 mg/dL (ref 212.0–360.0)

## 2013-09-16 LAB — BASIC METABOLIC PANEL
BUN: 12 mg/dL (ref 6–23)
CHLORIDE: 101 meq/L (ref 96–112)
CO2: 31 mEq/L (ref 19–32)
CREATININE: 0.8 mg/dL (ref 0.4–1.2)
Calcium: 10.2 mg/dL (ref 8.4–10.5)
GFR: 103.63 mL/min (ref >60.00)
GLUCOSE: 98 mg/dL (ref 70–99)
POTASSIUM: 3.5 meq/L (ref 3.5–5.1)
Sodium: 140 mEq/L (ref 135–145)

## 2013-09-16 LAB — CBC WITH DIFFERENTIAL/PLATELET
BASOS PCT: 1 % (ref 0.0–3.0)
Basophils Absolute: 0.1 10*3/uL (ref 0.0–0.1)
EOS PCT: 2.5 % (ref 0.0–5.0)
Eosinophils Absolute: 0.2 10*3/uL (ref 0.0–0.7)
HCT: 41.8 % (ref 36.0–46.0)
HEMOGLOBIN: 13 g/dL (ref 12.0–15.0)
Lymphocytes Relative: 34.4 % (ref 12.0–46.0)
Lymphs Abs: 2.4 10*3/uL (ref 0.7–4.0)
MCHC: 31.1 g/dL (ref 30.0–36.0)
MCV: 71.7 fl — ABNORMAL LOW (ref 78.0–100.0)
MONOS PCT: 12.1 % — AB (ref 3.0–12.0)
Monocytes Absolute: 0.8 10*3/uL (ref 0.1–1.0)
NEUTROS ABS: 3.5 10*3/uL (ref 1.4–7.7)
NEUTROS PCT: 50 % (ref 43.0–77.0)
Platelets: 264 10*3/uL (ref 150.0–400.0)
RBC: 5.84 Mil/uL — AB (ref 3.87–5.11)
RDW: 16.1 % — ABNORMAL HIGH (ref 11.5–14.6)
WBC: 6.9 10*3/uL (ref 4.5–10.5)

## 2013-09-16 LAB — FERRITIN: Ferritin: 10.7 ng/mL (ref 10.0–291.0)

## 2013-09-16 MED ORDER — LORCASERIN HCL 10 MG PO TABS: 1.0000 | ORAL_TABLET | Freq: Two times a day (BID) | ORAL | Status: DC

## 2013-09-16 NOTE — Assessment & Plan Note (Signed)
Her BP is well controlled 

## 2013-09-16 NOTE — Assessment & Plan Note (Signed)
I will recheck her CBC and vitamin levels today

## 2013-09-16 NOTE — Progress Notes (Signed)
   Subjective:    Patient ID: Mary Sanford, female    DOB: 1965/10/16, 48 y.o.   MRN: [id number]  Anemia Presents for follow-up visit. There has been no abdominal pain, anorexia, bruising/bleeding easily, confusion, fever, leg swelling, light-headedness, malaise/fatigue, pallor, palpitations, paresthesias, pica or weight loss. Signs of blood loss that are present include menorrhagia. Signs of blood loss that are not present include hematemesis, hematochezia and melena. Past treatments include parenteral vitamin B12 and oral iron supplements.      Review of Systems  Constitutional: Positive for unexpected weight change (wt gain). Negative for fever, chills, weight loss, malaise/fatigue, diaphoresis, activity change, appetite change and fatigue.  HENT: Negative.   Eyes: Negative.   Respiratory: Negative.  Negative for cough, choking, chest tightness, shortness of breath, wheezing and stridor.   Cardiovascular: Negative.  Negative for chest pain, palpitations and leg swelling.  Gastrointestinal: Negative.  Negative for nausea, vomiting, abdominal pain, diarrhea, constipation, blood in stool, melena, hematochezia, anorexia and hematemesis.  Endocrine: Negative.   Genitourinary: Positive for menorrhagia.  Musculoskeletal: Negative.   Skin: Negative.  Negative for pallor.  Allergic/Immunologic: Negative.   Neurological: Negative.  Negative for light-headedness and paresthesias.  Hematological: Negative.  Negative for adenopathy. Does not bruise/bleed easily.  Psychiatric/Behavioral: Negative.  Negative for confusion.       Objective:   Physical Exam  Vitals reviewed. Constitutional: She is oriented to person, place, and time. She appears well-developed and well-nourished. No distress.  HENT:  Head: Normocephalic and atraumatic.  Mouth/Throat: Oropharynx is clear and moist. No oropharyngeal exudate.  Eyes: Conjunctivae are normal. Right eye exhibits no discharge. Left eye exhibits no  discharge. No scleral icterus.  Neck: Normal range of motion. Neck supple. No JVD present. No tracheal deviation present. No thyromegaly present.  Cardiovascular: Normal rate, regular rhythm, normal heart sounds and intact distal pulses.  Exam reveals no gallop and no friction rub.   No murmur heard. Pulmonary/Chest: Effort normal and breath sounds normal. No stridor. No respiratory distress. She has no wheezes. She has no rales. She exhibits no tenderness.  Abdominal: Soft. Bowel sounds are normal. She exhibits no distension and no mass. There is no tenderness. There is no rebound and no guarding.  Musculoskeletal: Normal range of motion. She exhibits no edema and no tenderness.  Lymphadenopathy:    She has no cervical adenopathy.  Neurological: She is oriented to person, place, and time.  Skin: Skin is warm and dry. No rash noted. She is not diaphoretic. No erythema. No pallor.     Lab Results  Component Value Date   WBC 6.5 06/09/2013   HGB 12.6 06/09/2013   HCT 37.9 06/09/2013   PLT 281 06/09/2013   GLUCOSE 84 06/09/2013   CHOL 117 01/12/2013   TRIG 64.0 01/12/2013   HDL 42.10 01/12/2013   LDLCALC 62 01/12/2013   ALT 16 01/12/2013   AST 15 01/12/2013   NA 138 06/09/2013   K 3.7 06/09/2013   CL 102 06/09/2013   CREATININE 0.74 06/09/2013   BUN 11 06/09/2013   CO2 29 06/09/2013   TSH 0.78 01/12/2013   HGBA1C 6.0 09/20/2012       Assessment & Plan:

## 2013-09-16 NOTE — Assessment & Plan Note (Signed)
In addition to diet and exercise she will try belviq for weight loss

## 2013-09-16 NOTE — Patient Instructions (Signed)
Obesity Obesity is defined as having too much total body fat and a body mass index (BMI) of 30 or more. BMI is an estimate of body fat and is calculated from your height and weight. Obesity happens when you consume more calories than you can burn by exercising or performing daily physical tasks. Prolonged obesity can cause major illnesses or emergencies, such as:   A stroke.  Heart disease.  Diabetes.  Cancer.  Arthritis.  High blood pressure (hypertension).  High cholesterol.  Sleep apnea.  Erectile dysfunction.  Infertility problems. CAUSES   Regularly eating unhealthy foods.  Physical inactivity.  Certain disorders, such as an underactive thyroid (hypothyroidism), Cushing's syndrome, and polycystic ovarian syndrome.  Certain medicines, such as steroids, some depression medicines, and antipsychotics.  Genetics.  Lack of sleep. DIAGNOSIS  A caregiver can diagnose obesity after calculating your BMI. Obesity will be diagnosed if your BMI is 30 or higher.  There are other methods of measuring obesity levels. Some other methods include measuring your skin fold thickness, your waist circumference, and comparing your hip circumference to your waist circumference. TREATMENT  A healthy treatment program includes some or all of the following:  Long-term dietary changes.  Exercise and physical activity.  Behavioral and lifestyle changes.  Medicine only under the supervision of your caregiver. Medicines may help, but only if they are used with diet and exercise programs. An unhealthy treatment program includes:  Fasting.  Fad diets.  Supplements and drugs. These choices do not succeed in long-term weight control.  HOME CARE INSTRUCTIONS   Exercise and perform physical activity as directed by your caregiver. To increase physical activity, try the following:  Use stairs instead of elevators.  Park farther away from store entrances.  Garden, bike, or walk instead of  watching television or using the computer.  Eat healthy, low-calorie foods and drinks on a regular basis. Eat more fruits and vegetables. Use low-calorie cookbooks or take healthy cooking classes.  Limit fast food, sweets, and processed snack foods.  Eat smaller portions.  Keep a daily journal of everything you eat. There are many free websites to help you with this. It may be helpful to measure your foods so you can determine if you are eating the correct portion sizes.  Avoid drinking alcohol. Drink more water and drinks without calories.  Take vitamins and supplements only as recommended by your caregiver.  Weight-loss support groups, Registered Dieticians, counselors, and stress reduction education can also be very helpful. SEEK IMMEDIATE MEDICAL CARE IF:  You have chest pain or tightness.  You have trouble breathing or feel short of breath.  You have weakness or leg numbness.  You feel confused or have trouble talking.  You have sudden changes in your vision. MAKE SURE YOU:  Understand these instructions.  Will watch your condition.  Will get help right away if you are not doing well or get worse. Document Released: 08/28/2004 Document Revised: 01/20/2012 Document Reviewed: 08/27/2011 [employer] Patient Information 2014 [employer].  

## 2013-09-16 NOTE — Assessment & Plan Note (Signed)
I will recheck her level today

## 2013-09-17 ENCOUNTER — Encounter: Payer: Self-pay | Admitting: Internal Medicine

## 2013-09-17 DIAGNOSIS — D509 Iron deficiency anemia, unspecified: Secondary | ICD-10-CM | POA: Insufficient documentation

## 2013-09-17 MED ORDER — FERRALET 90 90-1 MG PO TABS: 1.0000 | ORAL_TABLET | Freq: Every day | ORAL | Status: DC

## 2013-09-17 NOTE — Addendum Note (Signed)
Addended by: [name] on: 09/17/2013 08:57 AM   Modules accepted: Orders

## 2013-09-19 ENCOUNTER — Telehealth: Payer: Self-pay | Admitting: Nurse Practitioner

## 2013-09-19 NOTE — Telephone Encounter (Signed)
Received phone call today regarding side effects from Belviq -   I have explained that our office was not the prescribing office and that she will need to contact her PCP - Dr. Scarlette Calico.  Will alert the answering service.  Burtis Junes, RN, [facility] [address] [place], [place]  [id number] [phone]

## 2013-10-07 ENCOUNTER — Telehealth: Payer: Self-pay

## 2013-10-07 NOTE — Telephone Encounter (Signed)
Relevant patient education assigned to patient using Emmi. ° °

## 2013-10-10 ENCOUNTER — Ambulatory Visit: Payer: Federal, State, Local not specified - PPO

## 2013-10-11 ENCOUNTER — Ambulatory Visit (INDEPENDENT_AMBULATORY_CARE_PROVIDER_SITE_OTHER): Payer: Federal, State, Local not specified - PPO | Admitting: *Deleted

## 2013-10-11 DIAGNOSIS — E538 Deficiency of other specified B group vitamins: Secondary | ICD-10-CM

## 2013-10-11 MED ORDER — CYANOCOBALAMIN 1000 MCG/ML IJ SOLN
1000.0000 ug | Freq: Once | INTRAMUSCULAR | Status: AC
  Administered 2013-10-11: 1000 ug via INTRAMUSCULAR

## 2013-10-25 ENCOUNTER — Ambulatory Visit: Payer: Federal, State, Local not specified - PPO

## 2013-10-25 ENCOUNTER — Ambulatory Visit (INDEPENDENT_AMBULATORY_CARE_PROVIDER_SITE_OTHER): Payer: Federal, State, Local not specified - PPO

## 2013-10-25 DIAGNOSIS — D519 Vitamin B12 deficiency anemia, unspecified: Secondary | ICD-10-CM

## 2013-10-25 DIAGNOSIS — D518 Other vitamin B12 deficiency anemias: Secondary | ICD-10-CM

## 2013-10-25 MED ORDER — CYANOCOBALAMIN 1000 MCG/ML IJ SOLN
1000.0000 ug | Freq: Once | INTRAMUSCULAR | Status: AC
  Administered 2013-10-25: 1000 ug via INTRAMUSCULAR

## 2013-11-04 ENCOUNTER — Other Ambulatory Visit: Payer: Self-pay | Admitting: Cardiovascular Disease

## 2013-11-08 ENCOUNTER — Ambulatory Visit (INDEPENDENT_AMBULATORY_CARE_PROVIDER_SITE_OTHER): Payer: Federal, State, Local not specified - PPO | Admitting: *Deleted

## 2013-11-08 DIAGNOSIS — D518 Other vitamin B12 deficiency anemias: Secondary | ICD-10-CM

## 2013-11-08 DIAGNOSIS — D519 Vitamin B12 deficiency anemia, unspecified: Secondary | ICD-10-CM

## 2013-11-08 MED ORDER — CYANOCOBALAMIN 1000 MCG/ML IJ SOLN
1000.0000 ug | Freq: Once | INTRAMUSCULAR | Status: AC
  Administered 2013-11-08: 1000 ug via INTRAMUSCULAR

## 2013-11-22 ENCOUNTER — Ambulatory Visit (INDEPENDENT_AMBULATORY_CARE_PROVIDER_SITE_OTHER): Payer: Federal, State, Local not specified - PPO | Admitting: *Deleted

## 2013-11-22 DIAGNOSIS — D518 Other vitamin B12 deficiency anemias: Secondary | ICD-10-CM

## 2013-11-22 DIAGNOSIS — D519 Vitamin B12 deficiency anemia, unspecified: Secondary | ICD-10-CM

## 2013-12-05 ENCOUNTER — Ambulatory Visit (INDEPENDENT_AMBULATORY_CARE_PROVIDER_SITE_OTHER): Payer: Federal, State, Local not specified - PPO | Admitting: Internal Medicine

## 2013-12-05 ENCOUNTER — Encounter: Payer: Self-pay | Admitting: Internal Medicine

## 2013-12-05 ENCOUNTER — Other Ambulatory Visit (INDEPENDENT_AMBULATORY_CARE_PROVIDER_SITE_OTHER): Payer: Federal, State, Local not specified - PPO

## 2013-12-05 VITALS — BP 126/80 | HR 78 | Temp 98.4°F | Resp 16 | Wt 229.0 lb

## 2013-12-05 DIAGNOSIS — D518 Other vitamin B12 deficiency anemias: Secondary | ICD-10-CM

## 2013-12-05 DIAGNOSIS — D519 Vitamin B12 deficiency anemia, unspecified: Secondary | ICD-10-CM

## 2013-12-05 DIAGNOSIS — E876 Hypokalemia: Secondary | ICD-10-CM

## 2013-12-05 DIAGNOSIS — D509 Iron deficiency anemia, unspecified: Secondary | ICD-10-CM

## 2013-12-05 DIAGNOSIS — I10 Essential (primary) hypertension: Secondary | ICD-10-CM

## 2013-12-05 LAB — CBC WITH DIFFERENTIAL/PLATELET
BASOS ABS: 0.1 10*3/uL (ref 0.0–0.1)
BASOS PCT: 0.9 % (ref 0.0–3.0)
Eosinophils Absolute: 0.2 10*3/uL (ref 0.0–0.7)
Eosinophils Relative: 2.8 % (ref 0.0–5.0)
HCT: 42.1 % (ref 36.0–46.0)
HEMOGLOBIN: 13.4 g/dL (ref 12.0–15.0)
LYMPHS PCT: 36.4 % (ref 12.0–46.0)
Lymphs Abs: 2.8 10*3/uL (ref 0.7–4.0)
MCHC: 31.9 g/dL (ref 30.0–36.0)
MCV: 72.3 fl — ABNORMAL LOW (ref 78.0–100.0)
Monocytes Absolute: 1.2 10*3/uL — ABNORMAL HIGH (ref 0.1–1.0)
Monocytes Relative: 14.9 % — ABNORMAL HIGH (ref 3.0–12.0)
NEUTROS ABS: 3.5 10*3/uL (ref 1.4–7.7)
Neutrophils Relative %: 45 % (ref 43.0–77.0)
Platelets: 252 10*3/uL (ref 150.0–400.0)
RBC: 5.82 Mil/uL — AB (ref 3.87–5.11)
RDW: 14.9 % — ABNORMAL HIGH (ref 11.5–14.6)
WBC: 7.8 10*3/uL (ref 4.5–10.5)

## 2013-12-05 NOTE — Patient Instructions (Signed)

## 2013-12-05 NOTE — Progress Notes (Signed)
Pre-visit discussion using our clinic review tool, as applicable. No additional management support is needed unless otherwise documented below in the visit note.  

## 2013-12-05 NOTE — Progress Notes (Signed)
   Subjective:    Patient ID: Mary Sanford, female    DOB: [birth year], 48 y.o.   MRN: [id number]  Hypertension This is a chronic problem. The current episode started more than 1 year ago. The problem is unchanged. The problem is controlled. Pertinent negatives include no anxiety, blurred vision, chest pain, headaches, malaise/fatigue, neck pain, orthopnea, palpitations, peripheral edema, PND, shortness of breath or sweats. There are no associated agents to hypertension. Past treatments include diuretics. The current treatment provides moderate improvement. Compliance problems include exercise and diet.       Review of Systems  Constitutional: Negative.  Negative for fever, chills, malaise/fatigue, diaphoresis, appetite change and fatigue.  HENT: Negative.   Eyes: Negative.  Negative for blurred vision.  Respiratory: Negative.  Negative for cough, choking, chest tightness, shortness of breath and wheezing.   Cardiovascular: Negative.  Negative for chest pain, palpitations, orthopnea, leg swelling and PND.  Gastrointestinal: Negative.  Negative for nausea, vomiting, abdominal pain, diarrhea and constipation.  Endocrine: Negative.   Genitourinary: Negative.   Musculoskeletal: Negative.  Negative for arthralgias, back pain and neck pain.  Skin: Negative.   Allergic/Immunologic: Negative.   Neurological: Negative.  Negative for dizziness, tremors, syncope, weakness, light-headedness, numbness and headaches.  Hematological: Negative.  Negative for adenopathy. Does not bruise/bleed easily.  Psychiatric/Behavioral: Negative.        Objective:   Physical Exam  Vitals reviewed. Constitutional: She is oriented to person, place, and time. She appears well-developed and well-nourished. No distress.  HENT:  Head: Normocephalic and atraumatic.  Mouth/Throat: Oropharynx is clear and moist. No oropharyngeal exudate.  Eyes: Conjunctivae are normal. Right eye exhibits no discharge. Left eye  exhibits no discharge. No scleral icterus.  Neck: Normal range of motion. Neck supple. No JVD present. No tracheal deviation present. No thyromegaly present.  Cardiovascular: Normal rate, regular rhythm, normal heart sounds and intact distal pulses.  Exam reveals no gallop and no friction rub.   No murmur heard. Pulmonary/Chest: Effort normal and breath sounds normal. No stridor. No respiratory distress. She has no wheezes. She has no rales. She exhibits no tenderness.  Abdominal: Soft. Bowel sounds are normal. She exhibits no distension and no mass. There is no tenderness. There is no rebound and no guarding.  Musculoskeletal: Normal range of motion. She exhibits no edema and no tenderness.  Lymphadenopathy:    She has no cervical adenopathy.  Neurological: She is oriented to person, place, and time.  Skin: Skin is warm and dry. No rash noted. She is not diaphoretic. No erythema. No pallor.  Psychiatric: She has a normal mood and affect. Her behavior is normal. Judgment and thought content normal.     Lab Results  Component Value Date   WBC 6.9 09/16/2013   HGB 13.0 09/16/2013   HCT 41.8 09/16/2013   PLT 264.0 09/16/2013   GLUCOSE 98 09/16/2013   CHOL 117 01/12/2013   TRIG 64.0 01/12/2013   HDL 42.10 01/12/2013   LDLCALC 62 01/12/2013   ALT 16 01/12/2013   AST 15 01/12/2013   NA 140 09/16/2013   K 3.5 09/16/2013   CL 101 09/16/2013   CREATININE 0.8 09/16/2013   BUN 12 09/16/2013   CO2 31 09/16/2013   TSH 0.78 01/12/2013   HGBA1C 6.0 09/20/2012       Assessment & Plan:

## 2013-12-06 ENCOUNTER — Encounter: Payer: Self-pay | Admitting: Internal Medicine

## 2013-12-06 ENCOUNTER — Ambulatory Visit: Payer: Federal, State, Local not specified - PPO

## 2013-12-06 LAB — BASIC METABOLIC PANEL
BUN: 14 mg/dL (ref 6–23)
CHLORIDE: 102 meq/L (ref 96–112)
CO2: 28 mEq/L (ref 19–32)
CREATININE: 0.8 mg/dL (ref 0.4–1.2)
Calcium: 10.2 mg/dL (ref 8.4–10.5)
GFR: 96.28 mL/min (ref >60.00)
Glucose, Bld: 92 mg/dL (ref 70–99)
Potassium: 4.2 mEq/L (ref 3.5–5.1)
Sodium: 137 mEq/L (ref 135–145)

## 2013-12-06 LAB — TSH: TSH: 1.05 u[IU]/mL (ref 0.35–4.50)

## 2013-12-06 NOTE — Assessment & Plan Note (Signed)
Her CBC shows improvement

## 2013-12-06 NOTE — Assessment & Plan Note (Signed)
Her BP is well controlled Her lytes and renal function are stable 

## 2013-12-15 ENCOUNTER — Ambulatory Visit: Payer: Federal, State, Local not specified - PPO | Admitting: Internal Medicine

## 2013-12-27 ENCOUNTER — Ambulatory Visit (INDEPENDENT_AMBULATORY_CARE_PROVIDER_SITE_OTHER)
Admission: RE | Admit: 2013-12-27 | Discharge: 2013-12-27 | Disposition: A | Discharge status: Home / Self Care | Payer: Federal, State, Local not specified - PPO | Source: Ambulatory Visit | Attending: Internal Medicine | Admitting: Internal Medicine

## 2013-12-27 ENCOUNTER — Other Ambulatory Visit: Payer: Federal, State, Local not specified - PPO

## 2013-12-27 ENCOUNTER — Ambulatory Visit (INDEPENDENT_AMBULATORY_CARE_PROVIDER_SITE_OTHER): Payer: Federal, State, Local not specified - PPO | Admitting: Internal Medicine

## 2013-12-27 ENCOUNTER — Encounter: Payer: Self-pay | Admitting: Internal Medicine

## 2013-12-27 VITALS — BP 128/86 | HR 99 | Temp 97.2°F | Resp 14 | Ht 69.0 in | Wt 234.0 lb

## 2013-12-27 DIAGNOSIS — R9431 Abnormal electrocardiogram [ECG] [EKG]: Secondary | ICD-10-CM

## 2013-12-27 DIAGNOSIS — R0789 Other chest pain: Secondary | ICD-10-CM

## 2013-12-27 DIAGNOSIS — R5383 Other fatigue: Secondary | ICD-10-CM

## 2013-12-27 DIAGNOSIS — D518 Other vitamin B12 deficiency anemias: Secondary | ICD-10-CM

## 2013-12-27 DIAGNOSIS — D519 Vitamin B12 deficiency anemia, unspecified: Secondary | ICD-10-CM

## 2013-12-27 DIAGNOSIS — R5381 Other malaise: Secondary | ICD-10-CM

## 2013-12-27 MED ORDER — CYANOCOBALAMIN 1000 MCG/ML IJ SOLN
1000.0000 ug | Freq: Once | INTRAMUSCULAR | Status: AC
  Administered 2013-12-27: 1000 ug via INTRAMUSCULAR

## 2013-12-27 MED ORDER — SERTRALINE HCL 50 MG PO TABS
ORAL_TABLET | ORAL | Status: DC
Start: 2013-12-27 — End: 2014-02-27

## 2013-12-27 NOTE — Progress Notes (Signed)
Subjective:    Patient ID: Mary Sanford, female    DOB: Dec 03, 1965, 48 y.o.   MRN: [id number]  HPI  She began having chest pain the evening of 12/25/13 described as substernal and aching up to level VI. It did radiate to her back. It had become essentially constant & it was worse with any movement or position change. It has been associated with a sense of tiredness.  There was no specific trigger prior to the onset  She quit smoking in 2001  Family history includes stroke in a maternal uncle over age 73; deep venous thrombosis in a sister; and heart attack in paternal grandfather, again over 61 age.  She also describes fatigue in the context of profound emotional stress. This relates to health problems in her mother and mother-in-law. 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   Review of Systems She denies significant dyspepsia or dysphagia  She has no associated abdominal pain or unexplained weight loss  There is no melena rectal bleeding.  She has no cough ,sputum production,or hemoptysis  There is no associated ankle edema,paroxysmal nocturnal dyspnea or claudication.  She has had no syncope or lightheadedness.  There was no rash or change in the color or temperature of skin in the area of pain.  She has had some weight gain. She also noted hoarseness intermittently; in fact she lost her voice 5/25.  She does describe some numbness and tingling in the fingers of her right hand in context of taking a weight loss product. This  improved having stopping that medication. She does feel that she had significant anxiety and possibly some depression.       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 Gen.: Healthy and well-nourished in appearance. Central weight excess.Alert, appropriate and cooperative throughout exam. Appears younger than stated age  Head:  Normocephalic without obvious abnormalities.  Eyes: No corneal or conjunctival inflammation noted. Pupils equal round reactive to light and accommodation.No icterus. Ears: External  ear exam reveals no significant lesions or deformities.  Nose: External nasal exam reveals no deformity or inflammation. Nasal mucosa are pink and moist. No lesions or exudates noted.   Mouth: Oral mucosa and oropharynx reveal no lesions or exudates. Teeth in good repair. Neck: No deformities, masses, or tenderness noted. Range of motion normal. Lungs: Normal respiratory effort; chest expands symmetrically. Lungs are clear to auscultation without rales, wheezes, or increased work of breathing. Heart: Normal rate and rhythm. Normal S1 and S2. No click or rub.  Abdomen: Bowel sounds normal; abdomen soft and nontender. No masses, organomegaly or hernias noted.                                Musculoskeletal/extremities: No deformity or scoliosis noted of  the thoracic or lumbar spine.  No clubbing, cyanosis, edema, or significant extremity  deformity noted. Range of motion normal .Tone & strength normal. Hand joints normal  Fingernail health good. Able to lie down & sit up w/o help. Negative SLR bilaterally. Homan's neg Vascular: Carotid, radial artery, dorsalis pedis and  posterior tibial pulses are full and equal. No bruits present. Neurologic: Alert and oriented x3. Deep tendon reflexes symmetrical and normal.  Gait normal .  Skin: Intact without suspicious lesions or rashes. Lymph: No cervical, axillary lymphadenopathy present. Psych: Mood  and affect are normal. Normally interactive                                                                                          Assessment & Plan:  #1 chest pain, atypical in its duration  #2 fatigue in the context of emotional stress.  #3 nonspecific ST-T changes with some loss of voltage especially in V leads but no definite ischemic change  See orders and  recommendations.  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 She was given the option of starting generic sertraline with titration of dose.   If pending labs are negative; stress test would be indicated.

## 2013-12-27 NOTE — Patient Instructions (Addendum)
Your next office appointment will be determined based upon review of your pending labs & x-rays. Those instructions will be transmitted to you through My Chart . Please consider taking the agent to raise the neurotransmitters which are essential for good brain function, both intellectual & emotional health. These agents are not addictive and simply keep this essential neurotransmitter at therapeutic levels. If these levels become severely depleted; depression or panic attacks can occur.    Take the EKG to any emergency room or preop visits. There are nonspecific changes; as long as there is no new change these are not clinically significant . If the old EKG is not available for comparison; it may result in unnecessary hospitalization for observation with significant unnecessary expense.

## 2013-12-28 LAB — D-DIMER, QUANTITATIVE: D-Dimer, Quant: 0.42 ug/mL-FEU (ref 0.00–0.48)

## 2013-12-28 LAB — TROPONIN I

## 2013-12-31 ENCOUNTER — Other Ambulatory Visit: Payer: Self-pay | Admitting: Internal Medicine

## 2013-12-31 DIAGNOSIS — R0789 Other chest pain: Secondary | ICD-10-CM

## 2013-12-31 DIAGNOSIS — R9431 Abnormal electrocardiogram [ECG] [EKG]: Secondary | ICD-10-CM

## 2014-01-11 ENCOUNTER — Encounter: Payer: Self-pay | Admitting: Internal Medicine

## 2014-01-11 ENCOUNTER — Ambulatory Visit (INDEPENDENT_AMBULATORY_CARE_PROVIDER_SITE_OTHER): Payer: Federal, State, Local not specified - PPO | Admitting: Internal Medicine

## 2014-01-11 VITALS — BP 128/84 | HR 65 | Temp 97.8°F | Resp 16 | Ht 69.0 in | Wt 233.0 lb

## 2014-01-11 DIAGNOSIS — D518 Other vitamin B12 deficiency anemias: Secondary | ICD-10-CM

## 2014-01-11 DIAGNOSIS — I10 Essential (primary) hypertension: Secondary | ICD-10-CM

## 2014-01-11 DIAGNOSIS — D519 Vitamin B12 deficiency anemia, unspecified: Secondary | ICD-10-CM

## 2014-01-11 DIAGNOSIS — E669 Obesity, unspecified: Secondary | ICD-10-CM

## 2014-01-11 MED ORDER — CYANOCOBALAMIN 1000 MCG/ML IJ SOLN
1000.0000 ug | Freq: Once | INTRAMUSCULAR | Status: AC
  Administered 2014-01-11: 1000 ug via INTRAMUSCULAR

## 2014-01-11 NOTE — Patient Instructions (Signed)

## 2014-01-11 NOTE — Progress Notes (Signed)
   Subjective:    Patient ID: Mary Sanford, female    DOB: 08-04-1966, 48 y.o.   MRN: [id number]  Hypertension This is a chronic problem. The current episode started more than 1 year ago. The problem has been gradually improving since onset. The problem is controlled. Pertinent negatives include no anxiety, blurred vision, chest pain, headaches, malaise/fatigue, neck pain, orthopnea, palpitations, peripheral edema, PND, shortness of breath or sweats. There are no associated agents to hypertension. Past treatments include diuretics. The current treatment provides significant improvement. There are no compliance problems.       Review of Systems  Constitutional: Negative.  Negative for fever, chills, malaise/fatigue, diaphoresis, appetite change and fatigue.  HENT: Negative.   Eyes: Negative.  Negative for blurred vision.  Respiratory: Negative.  Negative for cough, choking, chest tightness, shortness of breath, wheezing and stridor.   Cardiovascular: Negative.  Negative for chest pain, palpitations, orthopnea, leg swelling and PND.  Gastrointestinal: Negative.  Negative for nausea, vomiting, abdominal pain, diarrhea, constipation and blood in stool.  Endocrine: Negative.   Genitourinary: Negative.   Musculoskeletal: Negative.  Negative for arthralgias, myalgias and neck pain.  Skin: Negative.  Negative for rash.  Allergic/Immunologic: Negative.   Neurological: Negative.  Negative for headaches.  Hematological: Negative.  Negative for adenopathy. Does not bruise/bleed easily.  Psychiatric/Behavioral: Negative.        Objective:   Physical Exam  Vitals reviewed. Constitutional: She is oriented to person, place, and time. She appears well-developed and well-nourished. No distress.  HENT:  Head: Normocephalic and atraumatic.  Mouth/Throat: Oropharynx is clear and moist. No oropharyngeal exudate.  Eyes: Conjunctivae are normal. Right eye exhibits no discharge. Left eye exhibits no  discharge. No scleral icterus.  Neck: Normal range of motion. Neck supple. No JVD present. No tracheal deviation present. No thyromegaly present.  Cardiovascular: Normal rate, regular rhythm, normal heart sounds and intact distal pulses.  Exam reveals no gallop and no friction rub.   No murmur heard. Pulmonary/Chest: Effort normal and breath sounds normal. No stridor. No respiratory distress. She has no wheezes. She has no rales. She exhibits no tenderness.  Abdominal: Soft. Bowel sounds are normal. She exhibits no distension and no mass. There is no tenderness. There is no rebound and no guarding.  Musculoskeletal: Normal range of motion. She exhibits no edema and no tenderness.  Lymphadenopathy:    She has no cervical adenopathy.  Neurological: She is oriented to person, place, and time.  Skin: Skin is warm and dry. No rash noted. She is not diaphoretic. No erythema. No pallor.  Psychiatric: She has a normal mood and affect. Her behavior is normal. Judgment and thought content normal.     Lab Results  Component Value Date   WBC 7.8 12/05/2013   HGB 13.4 12/05/2013   HCT 42.1 12/05/2013   PLT 252.0 12/05/2013   GLUCOSE 92 12/05/2013   CHOL 117 01/12/2013   TRIG 64.0 01/12/2013   HDL 42.10 01/12/2013   LDLCALC 62 01/12/2013   ALT 16 01/12/2013   AST 15 01/12/2013   NA 137 12/05/2013   K 4.2 12/05/2013   CL 102 12/05/2013   CREATININE 0.8 12/05/2013   BUN 14 12/05/2013   CO2 28 12/05/2013   TSH 1.05 12/05/2013   HGBA1C 6.0 09/20/2012       Assessment & Plan:

## 2014-01-11 NOTE — Progress Notes (Signed)
Pre visit review using our clinic review tool, if applicable. No additional management support is needed unless otherwise documented below in the visit note. 

## 2014-01-12 ENCOUNTER — Telehealth: Payer: Self-pay

## 2014-01-12 MED ORDER — HYDROCHLOROTHIAZIDE 25 MG PO TABS: ORAL_TABLET | ORAL | Status: DC

## 2014-01-12 NOTE — Telephone Encounter (Signed)
Rx resent to [facility]

## 2014-01-12 NOTE — Assessment & Plan Note (Signed)
She is working on her lifestyle modifications to lose weight. 

## 2014-01-12 NOTE — Assessment & Plan Note (Signed)
Her BP is well controlled 

## 2014-01-12 NOTE — Telephone Encounter (Signed)
The patient called and stated her blood pressure medicine isn't at [facility]. She is hoping this medicine can be called in today. Thanks!   Pt callback - [phone]

## 2014-02-07 ENCOUNTER — Ambulatory Visit (INDEPENDENT_AMBULATORY_CARE_PROVIDER_SITE_OTHER): Payer: Federal, State, Local not specified - PPO

## 2014-02-07 DIAGNOSIS — E538 Deficiency of other specified B group vitamins: Secondary | ICD-10-CM

## 2014-02-07 MED ORDER — CYANOCOBALAMIN 1000 MCG/ML IJ SOLN
1000.0000 ug | Freq: Once | INTRAMUSCULAR | Status: AC
  Administered 2014-02-07: 1000 ug via INTRAMUSCULAR

## 2014-02-22 ENCOUNTER — Ambulatory Visit (INDEPENDENT_AMBULATORY_CARE_PROVIDER_SITE_OTHER): Payer: Federal, State, Local not specified - PPO

## 2014-02-22 DIAGNOSIS — E538 Deficiency of other specified B group vitamins: Secondary | ICD-10-CM

## 2014-02-22 MED ORDER — CYANOCOBALAMIN 1000 MCG/ML IJ SOLN
1000.0000 ug | Freq: Once | INTRAMUSCULAR | Status: AC
  Administered 2014-02-22: 1000 ug via INTRAMUSCULAR

## 2014-02-27 ENCOUNTER — Encounter: Payer: Self-pay | Admitting: Internal Medicine

## 2014-02-27 ENCOUNTER — Ambulatory Visit (INDEPENDENT_AMBULATORY_CARE_PROVIDER_SITE_OTHER): Payer: Federal, State, Local not specified - PPO | Admitting: Internal Medicine

## 2014-02-27 VITALS — BP 118/82 | HR 72 | Temp 98.3°F | Resp 16 | Ht 69.0 in | Wt 236.0 lb

## 2014-02-27 DIAGNOSIS — E669 Obesity, unspecified: Secondary | ICD-10-CM

## 2014-02-27 DIAGNOSIS — I10 Essential (primary) hypertension: Secondary | ICD-10-CM

## 2014-02-27 MED ORDER — PHENTERMINE HCL 37.5 MG PO CAPS: 37.5000 mg | ORAL_CAPSULE | ORAL | Status: DC

## 2014-02-27 NOTE — Patient Instructions (Signed)

## 2014-02-27 NOTE — Progress Notes (Signed)
Pre visit review using our clinic review tool, if applicable. No additional management support is needed unless otherwise documented below in the visit note. 

## 2014-02-27 NOTE — Progress Notes (Signed)
   Subjective:    Patient ID: Mary Sanford, female    DOB: 14-Jun-1966, 48 y.o.   MRN: [id number]  HPI Comments: She has decided to stop zoloft due to weight gain and she wants to be on phentermine again.     Review of Systems  Constitutional: Positive for unexpected weight change. Negative for fever, chills, diaphoresis, appetite change and fatigue.  HENT: Negative.   Eyes: Negative.   Respiratory: Negative.  Negative for cough, choking, chest tightness, shortness of breath, wheezing and stridor.   Cardiovascular: Negative.  Negative for chest pain, palpitations and leg swelling.  Gastrointestinal: Negative.  Negative for abdominal pain.  Endocrine: Negative.   Genitourinary: Negative.   Musculoskeletal: Negative.   Skin: Negative.   Allergic/Immunologic: Negative.   Neurological: Negative.   Hematological: Negative.  Negative for adenopathy. Does not bruise/bleed easily.  Psychiatric/Behavioral: Negative.  Negative for suicidal ideas, confusion, sleep disturbance, dysphoric mood and agitation. The patient is not nervous/anxious.        Objective:   Physical Exam  Vitals reviewed. Constitutional: She is oriented to person, place, and time. She appears well-developed and well-nourished. No distress.  HENT:  Head: Normocephalic and atraumatic.  Mouth/Throat: Oropharynx is clear and moist. No oropharyngeal exudate.  Eyes: Conjunctivae are normal. Right eye exhibits no discharge. Left eye exhibits no discharge. No scleral icterus.  Neck: Normal range of motion. Neck supple. No JVD present. No tracheal deviation present. No thyromegaly present.  Cardiovascular: Normal rate, regular rhythm, normal heart sounds and intact distal pulses.  Exam reveals no gallop and no friction rub.   No murmur heard. Pulmonary/Chest: Effort normal and breath sounds normal. No stridor. No respiratory distress. She has no wheezes. She has no rales. She exhibits no tenderness.  Abdominal: Soft. Bowel  sounds are normal. She exhibits no distension and no mass. There is no tenderness. There is no rebound and no guarding.  Musculoskeletal: Normal range of motion. She exhibits no edema and no tenderness.  Lymphadenopathy:    She has no cervical adenopathy.  Neurological: She is oriented to person, place, and time.  Skin: Skin is warm and dry. No rash noted. She is not diaphoretic. No erythema. No pallor.     Lab Results  Component Value Date   WBC 7.8 12/05/2013   HGB 13.4 12/05/2013   HCT 42.1 12/05/2013   PLT 252.0 12/05/2013   GLUCOSE 92 12/05/2013   CHOL 117 01/12/2013   TRIG 64.0 01/12/2013   HDL 42.10 01/12/2013   LDLCALC 62 01/12/2013   ALT 16 01/12/2013   AST 15 01/12/2013   NA 137 12/05/2013   K 4.2 12/05/2013   CL 102 12/05/2013   CREATININE 0.8 12/05/2013   BUN 14 12/05/2013   CO2 28 12/05/2013   TSH 1.05 12/05/2013   HGBA1C 6.0 09/20/2012       Assessment & Plan:

## 2014-02-28 ENCOUNTER — Encounter: Payer: Self-pay | Admitting: Internal Medicine

## 2014-02-28 NOTE — Assessment & Plan Note (Signed)
In addition to diet and exercise, she will try phentermine

## 2014-02-28 NOTE — Assessment & Plan Note (Signed)
Her BP is well controlled 

## 2014-03-08 ENCOUNTER — Ambulatory Visit (INDEPENDENT_AMBULATORY_CARE_PROVIDER_SITE_OTHER): Payer: Federal, State, Local not specified - PPO

## 2014-03-08 DIAGNOSIS — E538 Deficiency of other specified B group vitamins: Secondary | ICD-10-CM

## 2014-03-08 MED ORDER — CYANOCOBALAMIN 1000 MCG/ML IJ SOLN
1000.0000 ug | Freq: Once | INTRAMUSCULAR | Status: AC
  Administered 2014-03-08: 1000 ug via INTRAMUSCULAR

## 2014-03-15 ENCOUNTER — Telehealth (HOSPITAL_COMMUNITY): Payer: Self-pay

## 2014-03-15 NOTE — Telephone Encounter (Signed)
Encounter complete. 

## 2014-03-17 ENCOUNTER — Encounter (HOSPITAL_COMMUNITY): Payer: Federal, State, Local not specified - PPO

## 2014-03-20 ENCOUNTER — Telehealth (HOSPITAL_COMMUNITY): Payer: Self-pay | Admitting: *Deleted

## 2014-03-23 ENCOUNTER — Ambulatory Visit (INDEPENDENT_AMBULATORY_CARE_PROVIDER_SITE_OTHER): Payer: Federal, State, Local not specified - PPO | Admitting: *Deleted

## 2014-03-23 DIAGNOSIS — D518 Other vitamin B12 deficiency anemias: Secondary | ICD-10-CM

## 2014-03-23 DIAGNOSIS — D519 Vitamin B12 deficiency anemia, unspecified: Secondary | ICD-10-CM

## 2014-03-23 MED ORDER — CYANOCOBALAMIN 1000 MCG/ML IJ SOLN
1000.0000 ug | Freq: Once | INTRAMUSCULAR | Status: AC
  Administered 2014-03-23: 1000 ug via INTRAMUSCULAR

## 2014-04-05 ENCOUNTER — Ambulatory Visit (INDEPENDENT_AMBULATORY_CARE_PROVIDER_SITE_OTHER): Payer: Federal, State, Local not specified - PPO

## 2014-04-05 DIAGNOSIS — E538 Deficiency of other specified B group vitamins: Secondary | ICD-10-CM

## 2014-04-05 MED ORDER — CYANOCOBALAMIN 1000 MCG/ML IJ SOLN
1000.0000 ug | Freq: Once | INTRAMUSCULAR | Status: AC
  Administered 2014-04-05: 1000 ug via INTRAMUSCULAR

## 2014-04-18 ENCOUNTER — Other Ambulatory Visit: Payer: Self-pay | Admitting: Internal Medicine

## 2014-04-26 ENCOUNTER — Ambulatory Visit (INDEPENDENT_AMBULATORY_CARE_PROVIDER_SITE_OTHER): Payer: Federal, State, Local not specified - PPO

## 2014-04-26 DIAGNOSIS — E538 Deficiency of other specified B group vitamins: Secondary | ICD-10-CM

## 2014-04-26 MED ORDER — CYANOCOBALAMIN 1000 MCG/ML IJ SOLN
1000.0000 ug | Freq: Once | INTRAMUSCULAR | Status: AC
  Administered 2014-04-26: 1000 ug via INTRAMUSCULAR

## 2014-05-10 ENCOUNTER — Ambulatory Visit (INDEPENDENT_AMBULATORY_CARE_PROVIDER_SITE_OTHER): Payer: Federal, State, Local not specified - PPO | Admitting: Geriatric Medicine

## 2014-05-10 ENCOUNTER — Ambulatory Visit: Payer: Federal, State, Local not specified - PPO

## 2014-05-10 DIAGNOSIS — E538 Deficiency of other specified B group vitamins: Secondary | ICD-10-CM

## 2014-05-10 MED ORDER — CYANOCOBALAMIN 1000 MCG/ML IJ SOLN
1000.0000 ug | Freq: Once | INTRAMUSCULAR | Status: AC
  Administered 2014-05-10: 1000 ug via INTRAMUSCULAR

## 2014-05-24 ENCOUNTER — Ambulatory Visit (INDEPENDENT_AMBULATORY_CARE_PROVIDER_SITE_OTHER): Payer: Federal, State, Local not specified - PPO

## 2014-05-24 DIAGNOSIS — E538 Deficiency of other specified B group vitamins: Secondary | ICD-10-CM

## 2014-05-24 MED ORDER — CYANOCOBALAMIN 1000 MCG/ML IJ SOLN
1000.0000 ug | Freq: Once | INTRAMUSCULAR | Status: AC
  Administered 2014-05-24: 1000 ug via INTRAMUSCULAR

## 2014-06-05 ENCOUNTER — Encounter: Payer: Self-pay | Admitting: Internal Medicine

## 2014-06-13 ENCOUNTER — Ambulatory Visit (INDEPENDENT_AMBULATORY_CARE_PROVIDER_SITE_OTHER): Payer: Federal, State, Local not specified - PPO | Admitting: *Deleted

## 2014-06-13 DIAGNOSIS — E519 Thiamine deficiency, unspecified: Secondary | ICD-10-CM

## 2014-06-13 DIAGNOSIS — D519 Vitamin B12 deficiency anemia, unspecified: Secondary | ICD-10-CM

## 2014-06-13 MED ORDER — CYANOCOBALAMIN 1000 MCG/ML IJ SOLN
1000.0000 ug | Freq: Once | INTRAMUSCULAR | Status: AC
  Administered 2014-06-13: 1000 ug via INTRAMUSCULAR

## 2014-07-05 ENCOUNTER — Ambulatory Visit (INDEPENDENT_AMBULATORY_CARE_PROVIDER_SITE_OTHER): Payer: Federal, State, Local not specified - PPO

## 2014-07-05 DIAGNOSIS — D519 Vitamin B12 deficiency anemia, unspecified: Secondary | ICD-10-CM

## 2014-07-05 MED ORDER — CYANOCOBALAMIN 1000 MCG/ML IJ SOLN
1000.0000 ug | Freq: Once | INTRAMUSCULAR | Status: AC
  Administered 2014-07-05: 1000 ug via INTRAMUSCULAR

## 2014-07-10 ENCOUNTER — Other Ambulatory Visit: Payer: Self-pay | Admitting: Internal Medicine

## 2014-07-12 NOTE — Telephone Encounter (Signed)
MD is out pls advise on refill.../lmb 

## 2014-07-12 NOTE — Telephone Encounter (Signed)
Called pt no answer [facility] rx ready for pick-up.../lmb 

## 2014-07-12 NOTE — Telephone Encounter (Signed)
Done hardcopy to MM 

## 2014-07-19 ENCOUNTER — Ambulatory Visit: Payer: Federal, State, Local not specified - PPO

## 2014-07-20 ENCOUNTER — Ambulatory Visit (INDEPENDENT_AMBULATORY_CARE_PROVIDER_SITE_OTHER): Payer: Federal, State, Local not specified - PPO | Admitting: *Deleted

## 2014-07-20 DIAGNOSIS — E538 Deficiency of other specified B group vitamins: Secondary | ICD-10-CM

## 2014-07-20 MED ORDER — CYANOCOBALAMIN 1000 MCG/ML IJ SOLN
1000.0000 ug | Freq: Once | INTRAMUSCULAR | Status: AC
  Administered 2014-07-20: 1000 ug via INTRAMUSCULAR

## 2014-08-03 ENCOUNTER — Ambulatory Visit (INDEPENDENT_AMBULATORY_CARE_PROVIDER_SITE_OTHER): Payer: Federal, State, Local not specified - PPO | Admitting: *Deleted

## 2014-08-03 ENCOUNTER — Ambulatory Visit: Payer: Federal, State, Local not specified - PPO

## 2014-08-03 DIAGNOSIS — D519 Vitamin B12 deficiency anemia, unspecified: Secondary | ICD-10-CM

## 2014-08-03 MED ORDER — CYANOCOBALAMIN 1000 MCG/ML IJ SOLN
1000.0000 ug | Freq: Once | INTRAMUSCULAR | Status: AC
  Administered 2014-08-03: 1000 ug via INTRAMUSCULAR

## 2014-08-07 ENCOUNTER — Other Ambulatory Visit: Payer: Self-pay

## 2014-08-07 DIAGNOSIS — Z1231 Encounter for screening mammogram for malignant neoplasm of breast: Secondary | ICD-10-CM

## 2014-08-08 ENCOUNTER — Ambulatory Visit
Admission: RE | Admit: 2014-08-08 | Discharge: 2014-08-08 | Disposition: A | Discharge status: Home / Self Care | Payer: Federal, State, Local not specified - PPO | Source: Ambulatory Visit

## 2014-08-08 DIAGNOSIS — Z1231 Encounter for screening mammogram for malignant neoplasm of breast: Secondary | ICD-10-CM

## 2014-08-09 LAB — HM MAMMOGRAPHY: HM MAMMO: NORMAL

## 2014-08-17 ENCOUNTER — Ambulatory Visit: Payer: Federal, State, Local not specified - PPO

## 2014-08-28 ENCOUNTER — Ambulatory Visit (INDEPENDENT_AMBULATORY_CARE_PROVIDER_SITE_OTHER): Payer: Federal, State, Local not specified - PPO

## 2014-08-28 DIAGNOSIS — E538 Deficiency of other specified B group vitamins: Secondary | ICD-10-CM

## 2014-08-28 MED ORDER — CYANOCOBALAMIN 1000 MCG/ML IJ SOLN
1000.0000 ug | Freq: Once | INTRAMUSCULAR | Status: AC
  Administered 2014-08-28: 1000 ug via INTRAMUSCULAR

## 2014-09-26 ENCOUNTER — Ambulatory Visit: Payer: Federal, State, Local not specified - PPO

## 2014-11-02 ENCOUNTER — Encounter: Payer: Self-pay | Admitting: Internal Medicine

## 2014-11-02 ENCOUNTER — Ambulatory Visit (INDEPENDENT_AMBULATORY_CARE_PROVIDER_SITE_OTHER): Payer: Federal, State, Local not specified - PPO | Admitting: Internal Medicine

## 2014-11-02 ENCOUNTER — Other Ambulatory Visit (INDEPENDENT_AMBULATORY_CARE_PROVIDER_SITE_OTHER): Payer: Federal, State, Local not specified - PPO

## 2014-11-02 ENCOUNTER — Telehealth: Payer: Self-pay

## 2014-11-02 VITALS — BP 120/98 | HR 93 | Temp 98.0°F | Resp 16 | Wt 243.8 lb

## 2014-11-02 DIAGNOSIS — E669 Obesity, unspecified: Secondary | ICD-10-CM | POA: Diagnosis not present

## 2014-11-02 DIAGNOSIS — D519 Vitamin B12 deficiency anemia, unspecified: Secondary | ICD-10-CM | POA: Diagnosis not present

## 2014-11-02 DIAGNOSIS — E876 Hypokalemia: Secondary | ICD-10-CM | POA: Diagnosis not present

## 2014-11-02 DIAGNOSIS — D509 Iron deficiency anemia, unspecified: Secondary | ICD-10-CM | POA: Diagnosis not present

## 2014-11-02 DIAGNOSIS — I10 Essential (primary) hypertension: Secondary | ICD-10-CM

## 2014-11-02 DIAGNOSIS — R7309 Other abnormal glucose: Secondary | ICD-10-CM

## 2014-11-02 DIAGNOSIS — R7303 Prediabetes: Secondary | ICD-10-CM

## 2014-11-02 LAB — COMPREHENSIVE METABOLIC PANEL
ALK PHOS: 68 U/L (ref 39–117)
ALT: 32 U/L (ref 0–35)
AST: 21 U/L (ref 0–37)
Albumin: 3.9 g/dL (ref 3.5–5.2)
BILIRUBIN TOTAL: 0.2 mg/dL (ref 0.2–1.2)
BUN: 12 mg/dL (ref 6–23)
CO2: 33 mEq/L — ABNORMAL HIGH (ref 19–32)
Calcium: 10.5 mg/dL (ref 8.4–10.5)
Chloride: 100 mEq/L (ref 96–112)
Creatinine, Ser: 0.83 mg/dL (ref 0.40–1.20)
GFR: 94.57 mL/min (ref >60.00)
GLUCOSE: 95 mg/dL (ref 70–99)
Potassium: 3.7 mEq/L (ref 3.5–5.1)
Sodium: 136 mEq/L (ref 135–145)
TOTAL PROTEIN: 7.3 g/dL (ref 6.0–8.3)

## 2014-11-02 LAB — CBC WITH DIFFERENTIAL/PLATELET
Basophils Absolute: 0.1 10*3/uL (ref 0.0–0.1)
Basophils Relative: 1.4 % (ref 0.0–3.0)
Eosinophils Absolute: 0.2 10*3/uL (ref 0.0–0.7)
Eosinophils Relative: 3.1 % (ref 0.0–5.0)
HEMATOCRIT: 44.1 % (ref 36.0–46.0)
Hemoglobin: 14.4 g/dL (ref 12.0–15.0)
Lymphocytes Relative: 31.8 % (ref 12.0–46.0)
Lymphs Abs: 2.5 10*3/uL (ref 0.7–4.0)
MCHC: 32.6 g/dL (ref 30.0–36.0)
MCV: 71.7 fl — AB (ref 78.0–100.0)
MONO ABS: 1 10*3/uL (ref 0.1–1.0)
Monocytes Relative: 13.1 % — ABNORMAL HIGH (ref 3.0–12.0)
NEUTROS PCT: 50.6 % (ref 43.0–77.0)
Neutro Abs: 4 10*3/uL (ref 1.4–7.7)
Platelets: 275 10*3/uL (ref 150.0–400.0)
RBC: 6.14 Mil/uL — AB (ref 3.87–5.11)
RDW: 14 % (ref 11.5–15.5)
WBC: 7.8 10*3/uL (ref 4.0–10.5)

## 2014-11-02 LAB — HEMOGLOBIN A1C: Hgb A1c MFr Bld: 6.4 % (ref 4.6–6.5)

## 2014-11-02 LAB — MAGNESIUM: Magnesium: 1.9 mg/dL (ref 1.5–2.5)

## 2014-11-02 MED ORDER — NALTREXONE-BUPROPION HCL ER 8-90 MG PO TB12: 2.0000 | ORAL_TABLET | Freq: Two times a day (BID) | ORAL | Status: DC

## 2014-11-02 MED ORDER — CYANOCOBALAMIN 1000 MCG/ML IJ SOLN
1000.0000 ug | Freq: Once | INTRAMUSCULAR | Status: AC
  Administered 2014-11-02: 1000 ug via INTRAMUSCULAR

## 2014-11-02 NOTE — Assessment & Plan Note (Signed)
She is not compliant with iron replacement therapy Will recheck her CBC today

## 2014-11-02 NOTE — Patient Instructions (Signed)

## 2014-11-02 NOTE — Progress Notes (Signed)
   Subjective:    Patient ID: Mary Sanford, female    DOB: [birth year], 49 y.o.   MRN: [id number]  Hypertension This is a chronic problem. The current episode started more than 1 year ago. The problem is unchanged. The problem is uncontrolled. Associated symptoms include anxiety. Pertinent negatives include no blurred vision, chest pain, headaches, malaise/fatigue, neck pain, orthopnea, palpitations, peripheral edema, PND, shortness of breath or sweats. Agents associated with hypertension include NSAIDs and amphetamines. Past treatments include diuretics. The current treatment provides moderate improvement. Compliance problems include diet and exercise.       Review of Systems  Constitutional: Positive for unexpected weight change (wt gain). Negative for fever, chills, malaise/fatigue, diaphoresis, appetite change and fatigue.  HENT: Negative.   Eyes: Negative.  Negative for blurred vision.  Respiratory: Negative.  Negative for cough, choking, chest tightness, shortness of breath and stridor.   Cardiovascular: Negative.  Negative for chest pain, palpitations, orthopnea, leg swelling and PND.  Gastrointestinal: Negative.  Negative for nausea, vomiting, abdominal pain, diarrhea, constipation and blood in stool.  Endocrine: Negative.   Genitourinary: Negative.   Musculoskeletal: Negative.  Negative for neck pain.  Skin: Negative.  Negative for rash.  Allergic/Immunologic: Negative.   Neurological: Negative.  Negative for dizziness, weakness, light-headedness, numbness and headaches.  Hematological: Negative.  Negative for adenopathy. Does not bruise/bleed easily.  Psychiatric/Behavioral: Negative.        Objective:   Physical Exam  Constitutional: She is oriented to person, place, and time. She appears well-developed and well-nourished. No distress.  HENT:  Head: Normocephalic and atraumatic.  Mouth/Throat: Oropharynx is clear and moist. No oropharyngeal exudate.  Eyes: Conjunctivae  are normal. Right eye exhibits no discharge. Left eye exhibits no discharge. No scleral icterus.  Neck: Normal range of motion. Neck supple. No JVD present. No tracheal deviation present. No thyromegaly present.  Cardiovascular: Normal rate, regular rhythm, normal heart sounds and intact distal pulses.  Exam reveals no gallop and no friction rub.   No murmur heard. Pulmonary/Chest: Effort normal and breath sounds normal. No stridor. No respiratory distress. She has no wheezes. She has no rales. She exhibits no tenderness.  Abdominal: Soft. Bowel sounds are normal. She exhibits no distension and no mass. There is no tenderness. There is no rebound and no guarding.  Musculoskeletal: Normal range of motion. She exhibits no edema or tenderness.  Lymphadenopathy:    She has no cervical adenopathy.  Neurological: She is oriented to person, place, and time.  Skin: Skin is warm and dry. No rash noted. She is not diaphoretic. No erythema. No pallor.  Vitals reviewed.  Lab Results  Component Value Date   WBC 7.8 12/05/2013   HGB 13.4 12/05/2013   HCT 42.1 12/05/2013   PLT 252.0 12/05/2013   GLUCOSE 92 12/05/2013   CHOL 117 01/12/2013   TRIG 64.0 01/12/2013   HDL 42.10 01/12/2013   LDLCALC 62 01/12/2013   ALT 16 01/12/2013   AST 15 01/12/2013   NA 137 12/05/2013   K 4.2 12/05/2013   CL 102 12/05/2013   CREATININE 0.8 12/05/2013   BUN 14 12/05/2013   CO2 28 12/05/2013   TSH 1.05 12/05/2013   HGBA1C 6.0 09/20/2012         Assessment & Plan:

## 2014-11-02 NOTE — Assessment & Plan Note (Signed)
CBC today and B12 injection She agrees to RTC monthly for B12 injections

## 2014-11-02 NOTE — Progress Notes (Signed)
Pre visit review using our clinic review tool, if applicable. No additional management support is needed unless otherwise documented below in the visit note. 

## 2014-11-02 NOTE — Assessment & Plan Note (Signed)
I will recheck her A1C She will work on her lifestyle modifications

## 2014-11-02 NOTE — Assessment & Plan Note (Signed)
Her BP is not well controlled Will stop the nsaids and phentermine Will check her labs today to screen for end organ damage and secondary causes of HTN She will work on her lifestyle modifications

## 2014-11-02 NOTE — Telephone Encounter (Signed)
eroneous encounter

## 2014-11-02 NOTE — Assessment & Plan Note (Signed)
I have asked her to try contrave for this

## 2014-11-03 ENCOUNTER — Encounter: Payer: Self-pay | Admitting: Internal Medicine

## 2014-11-05 ENCOUNTER — Encounter (HOSPITAL_COMMUNITY): Payer: Self-pay | Admitting: Emergency Medicine

## 2014-11-05 ENCOUNTER — Emergency Department (HOSPITAL_COMMUNITY)
Admission: EM | Admit: 2014-11-05 | Discharge: 2014-11-05 | Disposition: A | Discharge status: Home / Self Care | Payer: Federal, State, Local not specified - PPO | Source: Home / Self Care | Attending: Emergency Medicine | Admitting: Emergency Medicine

## 2014-11-05 DIAGNOSIS — J302 Other seasonal allergic rhinitis: Secondary | ICD-10-CM | POA: Diagnosis not present

## 2014-11-05 DIAGNOSIS — J4531 Mild persistent asthma with (acute) exacerbation: Secondary | ICD-10-CM | POA: Diagnosis not present

## 2014-11-05 MED ORDER — TRIAMCINOLONE ACETONIDE 40 MG/ML IJ SUSP
INTRAMUSCULAR | Status: AC
  Filled 2014-11-05: qty 1

## 2014-11-05 MED ORDER — IPRATROPIUM-ALBUTEROL 0.5-2.5 (3) MG/3ML IN SOLN
RESPIRATORY_TRACT | Status: AC
  Filled 2014-11-05: qty 3

## 2014-11-05 MED ORDER — ALBUTEROL SULFATE (2.5 MG/3ML) 0.083% IN NEBU
INHALATION_SOLUTION | RESPIRATORY_TRACT | Status: AC
  Filled 2014-11-05: qty 3

## 2014-11-05 MED ORDER — ALBUTEROL SULFATE (2.5 MG/3ML) 0.083% IN NEBU
2.5000 mg | INHALATION_SOLUTION | Freq: Once | RESPIRATORY_TRACT | Status: AC
  Administered 2014-11-05: 2.5 mg via RESPIRATORY_TRACT

## 2014-11-05 MED ORDER — PREDNISONE 20 MG PO TABS: ORAL_TABLET | ORAL | Status: DC

## 2014-11-05 MED ORDER — IPRATROPIUM-ALBUTEROL 0.5-2.5 (3) MG/3ML IN SOLN
3.0000 mL | Freq: Once | RESPIRATORY_TRACT | Status: AC
  Administered 2014-11-05: 3 mL via RESPIRATORY_TRACT

## 2014-11-05 MED ORDER — ALBUTEROL SULFATE HFA 108 (90 BASE) MCG/ACT IN AERS: 2.0000 | INHALATION_SPRAY | RESPIRATORY_TRACT | Status: DC | PRN

## 2014-11-05 MED ORDER — TRIAMCINOLONE ACETONIDE 40 MG/ML IJ SUSP
40.0000 mg | Freq: Once | INTRAMUSCULAR | Status: AC
  Administered 2014-11-05: 40 mg via INTRAMUSCULAR

## 2014-11-05 NOTE — ED Provider Notes (Signed)
CSN: [id number]     Arrival date & time 11/05/14  1346 History   First MD Initiated Contact with Patient 11/05/14 1446     Chief Complaint  Patient presents with  . Cough   (Consider location/radiation/quality/duration/timing/severity/associated sxs/prior Treatment) HPI Comments: 49 year old female complaining of cough, body aches, tiredness, dizziness and possible PND for 3 days. She denies fever. She has coughing spasms. No vomiting or diarrhea. No abdominal pain.  Patient is a 49 y.o. female presenting with cough.  Cough Associated symptoms: rhinorrhea   Associated symptoms: no chest pain, no ear pain, no fever, no shortness of breath and no sore throat     Past Medical History  Diagnosis Date  . Anemia   . Hypertension   . Kidney stones   . B12 deficiency   . Depression     no meds  . Cough     non- productive   Past Surgical History  Procedure Laterality Date  . Caesarean section    . Cystoscopic      extraction of ureteric calculus without disintegration  . Hemorrhoidectomy with hemorrhoid banding  10/19/2012    SCA THD Hem ligation/pexy  . Tubal ligation    . Wisdom tooth extraction    . Dilitation & currettage/hystroscopy with novasure ablation N/A 06/17/2013    Procedure: DILATATION & CURETTAGE/HYSTEROSCOPY WITH NOVASURE ABLATION;  Surgeon: Osborne Oman, MD;  Location: [facility];  Service: Gynecology;  Laterality: N/A;   Family History  Problem Relation Age of Onset  . Asthma    . Hypothyroidism    . Early death Neg Hx   . Heart disease Paternal Grandfather     >55  . Hyperlipidemia Neg Hx   . Kidney disease Neg Hx   . Stroke Maternal Uncle     > 55  . Hypertension Mother   . Hypothyroidism Mother   . Asthma Mother   . Cancer Mother 21    Colon and rectal  . Hypotension Sister   . Hypothyroidism Sister   . Diabetes Sister   . Seizures Brother   . Hypothyroidism Sister   . Seizures Sister   . Anemia Sister   . Deep vein thrombosis Sister     History  Substance Use Topics  . Smoking status: Former Smoker    Types: Cigarettes    Quit date: 08/05/1999  . Smokeless tobacco: Never Used  . Alcohol Use: No   OB History    Gravida Para Term Preterm AB TAB SAB Ectopic Multiple Living   7 5 4 1 2 2    5      Review of Systems  Constitutional: Positive for activity change. Negative for fever.  HENT: Positive for congestion and rhinorrhea. Negative for ear pain, postnasal drip and sore throat.   Respiratory: Positive for cough. Negative for shortness of breath.   Cardiovascular: Negative for chest pain.  Gastrointestinal: Negative.   Genitourinary: Negative.   Musculoskeletal: Negative.   Neurological: Negative.     Allergies  Naproxen  Home Medications   Prior to Admission medications   Medication Sig Start Date End Date Taking? Authorizing Provider  acetaminophen (TYLENOL) 500 MG tablet Take 1,000 mg by mouth every 6 (six) hours as needed for pain.    Historical Provider, MD  albuterol (PROVENTIL HFA;VENTOLIN HFA) 108 (90 BASE) MCG/ACT inhaler Inhale 2 puffs into the lungs every 4 (four) hours as needed for wheezing or shortness of breath. 11/05/14   Janne Napoleon, NP  hydrochlorothiazide (HYDRODIURIL) 25 MG tablet  TAKE ONE TABLET BY MOUTH ONCE DAILY 01/12/14   [name], MD  Naltrexone-Bupropion HCl ER (CONTRAVE) 8-90 MG TB12 Take 2 tablets by mouth 2 (two) times daily. 11/02/14   [name], MD  predniSONE (DELTASONE) 20 MG tablet 2 tabs po q d for 3 days then 1 tab q d for 3 days 11/05/14   Janne Napoleon, NP   BP 164/91 mmHg  Pulse 114  Temp(Src) 98.2 F (36.8 C) (Oral)  Resp 22  SpO2 97%  LMP 10/25/2014 Physical Exam  Constitutional: She is oriented to person, place, and time. She appears well-developed and well-nourished. No distress.  HENT:  Mouth/Throat: No oropharyngeal exudate.  Bilateral TMs are retracted with minor erythema over the umbos. Oropharynx with erythema, cobblestoning and moderate clear PND.   Eyes: Conjunctivae and EOM are normal.  Neck: Normal range of motion. Neck supple.  Cardiovascular: Normal rate, regular rhythm and normal heart sounds.   Pulmonary/Chest: Effort normal. She has no rales.  Patient has frequent dry coughing. She is unable to take a deep breath in and out without having coughing spasms. During the cough there is coarseness.  Neurological: She is alert and oriented to person, place, and time.  Skin: Skin is warm.  Nursing note and vitals reviewed.   ED Course  Procedures (including critical care time) Labs Review Labs Reviewed - No data to display  Imaging Review No results found.   MDM   1. Other seasonal allergic rhinitis   2. RAD (reactive airway disease) with wheezing, mild persistent, with acute exacerbation    Duoneb 5/2.5. Post neb much improved air movement, decreased wheeze and cough. Kenalog 40 mg IM Albuterol HFA Prednisone low dose Allegra Flonase or Rhinocort Saline nasal spray as needed     Janne Napoleon, NP 11/05/14 [id number]

## 2014-11-05 NOTE — ED Notes (Signed)
Pt states that she has had a bad cough for 3 days.

## 2014-11-05 NOTE — Discharge Instructions (Signed)
Allergic Rhinitis Allegra Flonase or Rhinocort Saline nasal spray as needed Allergic rhinitis is when the mucous membranes in the nose respond to allergens. Allergens are particles in the air that cause your body to have an allergic reaction. This causes you to release allergic antibodies. Through a chain of events, these eventually cause you to release histamine into the blood stream. Although meant to protect the body, it is this release of histamine that causes your discomfort, such as frequent sneezing, congestion, and an itchy, runny nose.  CAUSES  Seasonal allergic rhinitis (hay fever) is caused by pollen allergens that may come from grasses, trees, and weeds. Year-round allergic rhinitis (perennial allergic rhinitis) is caused by allergens such as house dust mites, pet dander, and mold spores.  SYMPTOMS   Nasal stuffiness (congestion).  Itchy, runny nose with sneezing and tearing of the eyes. DIAGNOSIS  Your health care provider can help you determine the allergen or allergens that trigger your symptoms. If you and your health care provider are unable to determine the allergen, skin or blood testing may be used. TREATMENT  Allergic rhinitis does not have a cure, but it can be controlled by:  Medicines and allergy shots (immunotherapy).  Avoiding the allergen. Hay fever may often be treated with antihistamines in pill or nasal spray forms. Antihistamines block the effects of histamine. There are over-the-counter medicines that may help with nasal congestion and swelling around the eyes. Check with your health care provider before taking or giving this medicine.  If avoiding the allergen or the medicine prescribed do not work, there are many new medicines your health care provider can prescribe. Stronger medicine may be used if initial measures are ineffective. Desensitizing injections can be used if medicine and avoidance does not work. Desensitization is when a patient is given ongoing  shots until the body becomes less sensitive to the allergen. Make sure you follow up with your health care provider if problems continue. HOME CARE INSTRUCTIONS It is not possible to completely avoid allergens, but you can reduce your symptoms by taking steps to limit your exposure to them. It helps to know exactly what you are allergic to so that you can avoid your specific triggers. SEEK MEDICAL CARE IF:   You have a fever.  You develop a cough that does not stop easily (persistent).  You have shortness of breath.  You start wheezing.  Symptoms interfere with normal daily activities. Document Released: 04/15/2001 Document Revised: 07/26/2013 Document Reviewed: 03/28/2013 [facility] Patient Information 2015 [place], [place]. This information is not intended to replace advice given to you by your health care provider. Make sure you discuss any questions you have with your health care provider.  How to Use an Inhaler Using your inhaler correctly is very important. Good technique will make sure that the medicine reaches your lungs.  HOW TO USE AN INHALER:  Take the cap off the inhaler.  If this is the first time using your inhaler, you need to prime it. Shake the inhaler for 5 seconds. Release four puffs into the air, away from your face. Ask your doctor for help if you have questions.  Shake the inhaler for 5 seconds.  Turn the inhaler so the bottle is above the mouthpiece.  Put your pointer finger on top of the bottle. Your thumb holds the bottom of the inhaler.  Open your mouth.  Either hold the inhaler away from your mouth (the width of 2 fingers) or place your lips tightly around the mouthpiece. Ask  your doctor which way to use your inhaler.  Breathe out as much air as possible.  Breathe in and push down on the bottle 1 time to release the medicine. You will feel the medicine go in your mouth and throat.  Continue to take a deep breath in very slowly. Try to fill your  lungs.  After you have breathed in completely, hold your breath for 10 seconds. This will help the medicine to settle in your lungs. If you cannot hold your breath for 10 seconds, hold it for as long as you can before you breathe out.  Breathe out slowly, through pursed lips. Whistling is an example of pursed lips.  If your doctor has told you to take more than 1 puff, wait at least 15-30 seconds between puffs. This will help you get the best results from your medicine. Do not use the inhaler more than your doctor tells you to.  Put the cap back on the inhaler.  Follow the directions from your doctor or from the inhaler package about cleaning the inhaler. If you use more than one inhaler, ask your doctor which inhalers to use and what order to use them in. Ask your doctor to help you figure out when you will need to refill your inhaler.  If you use a steroid inhaler, always rinse your mouth with water after your last puff, gargle and spit out the water. Do not swallow the water. GET HELP IF:  The inhaler medicine only partially helps to stop wheezing or shortness of breath.  You are having trouble using your inhaler.  You have some increase in thick spit (phlegm). GET HELP RIGHT AWAY IF:  The inhaler medicine does not help your wheezing or shortness of breath or you have tightness in your chest.  You have dizziness, headaches, or fast heart rate.  You have chills, fever, or night sweats.  You have a large increase of thick spit, or your thick spit is bloody. MAKE SURE YOU:   Understand these instructions.  Will watch your condition.  Will get help right away if you are not doing well or get worse. Document Released: 04/29/2008 Document Revised: 05/11/2013 Document Reviewed: 02/17/2013 [facility] Patient Information 2015 [place], [place]. This information is not intended to replace advice given to you by your health care provider. Make sure you discuss any questions you have with  your health care provider.  Bronchospasm A bronchospasm is a spasm or tightening of the airways going into the lungs. During a bronchospasm breathing becomes more difficult because the airways get smaller. When this happens there can be coughing, a whistling sound when breathing (wheezing), and difficulty breathing. Bronchospasm is often associated with asthma, but not all patients who experience a bronchospasm have asthma. CAUSES  A bronchospasm is caused by inflammation or irritation of the airways. The inflammation or irritation may be triggered by:   Allergies (such as to animals, pollen, food, or mold). Allergens that cause bronchospasm may cause wheezing immediately after exposure or many hours later.   Infection. Viral infections are believed to be the most common cause of bronchospasm.   Exercise.   Irritants (such as pollution, cigarette smoke, strong odors, aerosol sprays, and paint fumes).   Weather changes. Winds increase molds and pollens in the air. Rain refreshes the air by washing irritants out. Cold air may cause inflammation.   Stress and emotional upset.  SIGNS AND SYMPTOMS   Wheezing.   Excessive nighttime coughing.   Frequent or severe coughing  with a simple cold.   Chest tightness.   Shortness of breath.  DIAGNOSIS  Bronchospasm is usually diagnosed through a history and physical exam. Tests, such as chest X-rays, are sometimes done to look for other conditions. TREATMENT   Inhaled medicines can be given to open up your airways and help you breathe. The medicines can be given using either an inhaler or a nebulizer machine.  Corticosteroid medicines may be given for severe bronchospasm, usually when it is associated with asthma. HOME CARE INSTRUCTIONS   Always have a plan prepared for seeking medical care. Know when to call your health care provider and local emergency services (911 in the [place] Know where you can access local emergency  care.  Only take medicines as directed by your health care provider.  If you were prescribed an inhaler or nebulizer machine, ask your health care provider to explain how to use it correctly. Always use a spacer with your inhaler if you were given one.  It is necessary to remain calm during an attack. Try to relax and breathe more slowly.  Control your home environment in the following ways:   Change your heating and air conditioning filter at least once a month.   Limit your use of fireplaces and wood stoves.  Do not smoke and do not allow smoking in your home.   Avoid exposure to perfumes and fragrances.   Get rid of pests (such as roaches and mice) and their droppings.   Throw away plants if you see mold on them.   Keep your house clean and dust free.   Replace carpet with wood, tile, or vinyl flooring. Carpet can trap dander and dust.   Use allergy-proof pillows, mattress covers, and box spring covers.   Wash bed sheets and blankets every week in hot water and dry them in a dryer.   Use blankets that are made of polyester or cotton.   Wash hands frequently. SEEK MEDICAL CARE IF:   You have muscle aches.   You have chest pain.   The sputum changes from clear or white to yellow, green, gray, or bloody.   The sputum you cough up gets thicker.   There are problems that may be related to the medicine you are given, such as a rash, itching, swelling, or trouble breathing.  SEEK IMMEDIATE MEDICAL CARE IF:   You have worsening wheezing and coughing even after taking your prescribed medicines.   You have increased difficulty breathing.   You develop severe chest pain. MAKE SURE YOU:   Understand these instructions.  Will watch your condition.  Will get help right away if you are not doing well or get worse. Document Released: 07/24/2003 Document Revised: 07/26/2013 Document Reviewed: 01/10/2013 [facility] Patient Information 2015 [place], [place].  This information is not intended to replace advice given to you by your health care provider. Make sure you discuss any questions you have with your health care provider.

## 2014-11-08 ENCOUNTER — Encounter: Payer: Self-pay | Admitting: Internal Medicine

## 2014-11-20 ENCOUNTER — Encounter: Payer: Self-pay | Admitting: Internal Medicine

## 2014-11-20 ENCOUNTER — Ambulatory Visit (INDEPENDENT_AMBULATORY_CARE_PROVIDER_SITE_OTHER): Payer: Federal, State, Local not specified - PPO | Admitting: Internal Medicine

## 2014-11-20 VITALS — BP 128/84 | HR 76 | Temp 97.9°F | Resp 16 | Wt 239.0 lb

## 2014-11-20 DIAGNOSIS — E538 Deficiency of other specified B group vitamins: Secondary | ICD-10-CM | POA: Diagnosis not present

## 2014-11-20 DIAGNOSIS — I10 Essential (primary) hypertension: Secondary | ICD-10-CM

## 2014-11-20 DIAGNOSIS — D519 Vitamin B12 deficiency anemia, unspecified: Secondary | ICD-10-CM

## 2014-11-20 DIAGNOSIS — K120 Recurrent oral aphthae: Secondary | ICD-10-CM | POA: Diagnosis not present

## 2014-11-20 MED ORDER — FIRST-DUKES MOUTHWASH MT SUSP: 5.0000 mL | Freq: Four times a day (QID) | OROMUCOSAL | Status: DC

## 2014-11-20 MED ORDER — CYANOCOBALAMIN 1000 MCG/ML IJ SOLN
1000.0000 ug | Freq: Once | INTRAMUSCULAR | Status: AC
  Administered 2014-11-20: 1000 ug via INTRAMUSCULAR

## 2014-11-20 NOTE — Patient Instructions (Signed)
Canker Sores  °Canker sores are painful, open sores on the inside of the mouth and cheek. They may be white or yellow. The sores usually heal in 1 to 2 weeks. Women are more likely than men to have recurrent canker sores. °CAUSES °The cause of canker sores is not well understood. More than one cause is likely. Canker sores do not appear to be caused by certain types of germs (viruses or bacteria). Canker sores may be caused by: °· An allergic reaction to certain foods. °· Digestive problems. °· Not having enough vitamin B12, folic acid, and iron. °· Female sex hormones. Sores may come only during certain phases of a menstrual cycle. Often, there is improvement during pregnancy. °· Genetics. Some people seem to inherit canker sore problems. °Emotional stress and injuries to the mouth may trigger outbreaks, but not cause them.  °DIAGNOSIS °Canker sores are diagnosed by exam.  °TREATMENT °· Patients who have frequent bouts of canker sores may have cultures taken of the sores, blood tests, or allergy tests. This helps determine if their sores are caused by a poor diet, an allergy, or some other preventable or treatable disease. °· Vitamins may prevent recurrences or reduce the severity of canker sores in people with poor nutrition. °· Numbing ointments can relieve pain. These are available in drug stores without a prescription. °· Anti-inflammatory steroid mouth rinses or gels may be prescribed by your caregiver for severe sores. °· Oral steroids may be prescribed if you have severe, recurrent canker sores. These strong medicines can cause many side effects and should be used only under the close direction of a dentist or physician. °· Mouth rinses containing the antibiotic medicine may be prescribed. They may lessen symptoms and speed healing. °Healing usually happens in about 1 or 2 weeks with or without treatment. Certain antibiotic mouth rinses given to pregnant women and young children can permanently stain teeth.  Talk to your caregiver about your treatment. °HOME CARE INSTRUCTIONS  °· Avoid foods that cause canker sores for you. °· Avoid citrus juices, spicy or salty foods, and coffee until the sores are healed. °· Use a soft-bristled toothbrush. °· Chew your food carefully to avoid biting your cheek. °· Apply topical numbing medicine to the sore to help relieve pain. °· Apply a thin paste of baking soda and water to the sore to help heal the sore. °· Only use mouth rinses or medicines for pain or discomfort as directed by your caregiver. °SEEK MEDICAL CARE IF:  °· Your symptoms are not better in 1 week. °· Your sores are still present after 2 weeks. °· Your sores are very painful. °· You have trouble breathing or swallowing. °· Your sores come back frequently. °Document Released: 11/15/2010 Document Revised: 11/15/2012 Document Reviewed: 11/15/2010 °[employer]® Patient Information ©2015 [employer]. This information is not intended to replace advice given to you by your health care provider. Make sure you discuss any questions you have with your health care provider. ° °

## 2014-11-20 NOTE — Progress Notes (Signed)
Pre visit review using our clinic review tool, if applicable. No additional management support is needed unless otherwise documented below in the visit note. 

## 2014-11-22 ENCOUNTER — Encounter: Payer: Self-pay | Admitting: Internal Medicine

## 2014-11-22 NOTE — Progress Notes (Signed)
   Subjective:    Patient ID: Mary Sanford, female    DOB: [birth year], 49 y.o.   MRN: [id number]  HPI Comments: She complains of a painful ulcer on the right upper side of her mouth for the last 5 days     Review of Systems  Constitutional: Negative.  Negative for fever, chills, diaphoresis, appetite change and fatigue.  HENT: Positive for mouth sores. Negative for sinus pressure, sore throat, tinnitus and trouble swallowing.   Eyes: Negative.   Respiratory: Negative.  Negative for cough, choking, chest tightness, shortness of breath and stridor.   Cardiovascular: Negative.  Negative for chest pain, palpitations and leg swelling.  Gastrointestinal: Negative.  Negative for abdominal pain.  Endocrine: Negative.   Genitourinary: Negative.   Musculoskeletal: Negative.   Skin: Negative.   Allergic/Immunologic: Negative.   Neurological: Negative.   Hematological: Negative.  Negative for adenopathy. Does not bruise/bleed easily.  Psychiatric/Behavioral: Negative.        Objective:   Physical Exam  Constitutional: She is oriented to person, place, and time. She appears well-developed.  Non-toxic appearance. She does not have a sickly appearance. She does not appear ill. No distress.  HENT:  Mouth/Throat: Oropharynx is clear and moist and mucous membranes are normal. Mucous membranes are not pale, not dry and not cyanotic. Oral lesions present. No trismus in the jaw. Normal dentition. No uvula swelling. No oropharyngeal exudate, posterior oropharyngeal edema, posterior oropharyngeal erythema or tonsillar abscesses.    Eyes: Conjunctivae are normal. Right eye exhibits no discharge. Left eye exhibits no discharge. No scleral icterus.  Neck: Normal range of motion. Neck supple. No JVD present. No tracheal deviation present. No thyromegaly present.  Cardiovascular: Normal rate, regular rhythm, normal heart sounds and intact distal pulses.  Exam reveals no gallop and no friction rub.   No  murmur heard. Pulmonary/Chest: Effort normal and breath sounds normal. No stridor. No respiratory distress. She has no wheezes. She has no rales. She exhibits no tenderness.  Abdominal: Soft. Bowel sounds are normal. She exhibits no distension and no mass. There is no tenderness. There is no rebound and no guarding.  Musculoskeletal: Normal range of motion. She exhibits no edema or tenderness.  Lymphadenopathy:    She has no cervical adenopathy.  Neurological: She is oriented to person, place, and time.  Skin: Skin is warm and dry. No rash noted. She is not diaphoretic. No erythema. No pallor.  Vitals reviewed.         Assessment & Plan:

## 2014-11-22 NOTE — Assessment & Plan Note (Signed)
Her BP is well controlled 

## 2014-11-22 NOTE — Assessment & Plan Note (Signed)
Will treat the pain and inflammation with Duke's suspension

## 2014-11-22 NOTE — Assessment & Plan Note (Signed)
B12 injection today Cont B12 monthly

## 2014-12-07 ENCOUNTER — Ambulatory Visit (INDEPENDENT_AMBULATORY_CARE_PROVIDER_SITE_OTHER): Payer: Federal, State, Local not specified - PPO | Admitting: *Deleted

## 2014-12-07 DIAGNOSIS — E538 Deficiency of other specified B group vitamins: Secondary | ICD-10-CM | POA: Diagnosis not present

## 2014-12-07 MED ORDER — CYANOCOBALAMIN 1000 MCG/ML IJ SOLN
1000.0000 ug | Freq: Once | INTRAMUSCULAR | Status: AC
  Administered 2014-12-07: 1000 ug via INTRAMUSCULAR

## 2014-12-21 ENCOUNTER — Ambulatory Visit (INDEPENDENT_AMBULATORY_CARE_PROVIDER_SITE_OTHER): Payer: Federal, State, Local not specified - PPO | Admitting: *Deleted

## 2014-12-21 DIAGNOSIS — E538 Deficiency of other specified B group vitamins: Secondary | ICD-10-CM

## 2014-12-21 MED ORDER — CYANOCOBALAMIN 1000 MCG/ML IJ SOLN
1000.0000 ug | Freq: Once | INTRAMUSCULAR | Status: AC
  Administered 2014-12-21: 1000 ug via INTRAMUSCULAR

## 2015-01-12 ENCOUNTER — Ambulatory Visit: Payer: Federal, State, Local not specified - PPO

## 2015-01-12 ENCOUNTER — Telehealth: Payer: Self-pay | Admitting: Internal Medicine

## 2015-01-25 ENCOUNTER — Ambulatory Visit (INDEPENDENT_AMBULATORY_CARE_PROVIDER_SITE_OTHER): Payer: Federal, State, Local not specified - PPO | Admitting: *Deleted

## 2015-01-25 DIAGNOSIS — E538 Deficiency of other specified B group vitamins: Secondary | ICD-10-CM | POA: Diagnosis not present

## 2015-01-25 MED ORDER — CYANOCOBALAMIN 1000 MCG/ML IJ SOLN
1000.0000 ug | Freq: Once | INTRAMUSCULAR | Status: AC
  Administered 2015-01-25: 1000 ug via INTRAMUSCULAR

## 2015-01-26 ENCOUNTER — Other Ambulatory Visit: Payer: Self-pay | Admitting: Internal Medicine

## 2015-01-28 ENCOUNTER — Encounter (HOSPITAL_COMMUNITY): Payer: Self-pay | Admitting: *Deleted

## 2015-01-28 ENCOUNTER — Emergency Department (HOSPITAL_COMMUNITY)
Admission: EM | Admit: 2015-01-28 | Discharge: 2015-01-29 | Disposition: A | Discharge status: Home / Self Care | Payer: Federal, State, Local not specified - PPO | Attending: Emergency Medicine | Admitting: Emergency Medicine

## 2015-01-28 DIAGNOSIS — Z3202 Encounter for pregnancy test, result negative: Secondary | ICD-10-CM | POA: Insufficient documentation

## 2015-01-28 DIAGNOSIS — Z87442 Personal history of urinary calculi: Secondary | ICD-10-CM | POA: Insufficient documentation

## 2015-01-28 DIAGNOSIS — Z87891 Personal history of nicotine dependence: Secondary | ICD-10-CM | POA: Insufficient documentation

## 2015-01-28 DIAGNOSIS — I10 Essential (primary) hypertension: Secondary | ICD-10-CM | POA: Insufficient documentation

## 2015-01-28 DIAGNOSIS — Z79899 Other long term (current) drug therapy: Secondary | ICD-10-CM | POA: Diagnosis not present

## 2015-01-28 DIAGNOSIS — F410 Panic disorder [episodic paroxysmal anxiety] without agoraphobia: Secondary | ICD-10-CM

## 2015-01-28 DIAGNOSIS — Z8639 Personal history of other endocrine, nutritional and metabolic disease: Secondary | ICD-10-CM | POA: Diagnosis not present

## 2015-01-28 DIAGNOSIS — Z862 Personal history of diseases of the blood and blood-forming organs and certain disorders involving the immune mechanism: Secondary | ICD-10-CM | POA: Insufficient documentation

## 2015-01-28 HISTORY — DX: Anxiety disorder, unspecified: F41.9

## 2015-01-28 NOTE — ED Notes (Signed)
Pt states that she started experiencing severe anxiety 15-30 minutes ago. States that she has a hx of panic attacks and used to take medications for them but it has been years. States that she takes Contrave for weight loss. States that she is on her second bottle but she feels the anxiety is from the medication.

## 2015-01-28 NOTE — ED Provider Notes (Signed)
CSN: [id number]     Arrival date & time 01/28/15  2244 History  This chart was scribed for Mary Lebeau, MD by Randa Evens, ED Scribe. This patient was seen in room B15C/B15C and the patient's care was started at 11:35 PM.      Chief Complaint  Patient presents with  . Panic Attack   Patient is a 49 y.o. female presenting with anxiety. The history is provided by the patient. No language interpreter was used.  Anxiety This is a recurrent problem. The current episode started less than 1 hour ago. The problem occurs constantly. The problem has been gradually improving. Pertinent negatives include no chest pain, no headaches and no shortness of breath. Associated symptoms comments: hyperventilation. Nothing aggravates the symptoms. Nothing relieves the symptoms. She has tried nothing for the symptoms. The treatment provided moderate relief.   HPI Comments: Mary BROKAW is a 49 y.o. female with PMHx listed below who presents to the Emergency Department complaining of anxiety onset today PTA. Pt states that she took a weight loss pill (contrave) earlier today. Pt states that tonight she was laying in the bed and she just didn't feel right. Pt states that she began hyperventilating. Pt does repot some slight abdominal pain as well. Pt states that this her second bottle of the weight loss pills.    Past Medical History  Diagnosis Date  . Anemia   . Hypertension   . Kidney stones   . B12 deficiency   . Depression     no meds  . Cough     non- productive  . Anxiety    Past Surgical History  Procedure Laterality Date  . Caesarean section    . Cystoscopic      extraction of ureteric calculus without disintegration  . Hemorrhoidectomy with hemorrhoid banding  10/19/2012    SCA THD Hem ligation/pexy  . Tubal ligation    . Wisdom tooth extraction    . Dilitation & currettage/hystroscopy with novasure ablation N/A 06/17/2013    Procedure: DILATATION & CURETTAGE/HYSTEROSCOPY WITH NOVASURE  ABLATION;  Surgeon: Osborne Oman, MD;  Location: [facility];  Service: Gynecology;  Laterality: N/A;   Family History  Problem Relation Age of Onset  . Asthma    . Hypothyroidism    . Early death Neg Hx   . Heart disease Paternal Grandfather     >55  . Hyperlipidemia Neg Hx   . Kidney disease Neg Hx   . Stroke Maternal Uncle     > 55  . Hypertension Mother   . Hypothyroidism Mother   . Asthma Mother   . Cancer Mother 87    Colon and rectal  . Hypotension Sister   . Hypothyroidism Sister   . Diabetes Sister   . Seizures Brother   . Hypothyroidism Sister   . Seizures Sister   . Anemia Sister   . Deep vein thrombosis Sister    History  Substance Use Topics  . Smoking status: Former Smoker    Types: Cigarettes    Quit date: 08/05/1999  . Smokeless tobacco: Never Used  . Alcohol Use: 4.2 oz/week    7 Glasses of wine per week   OB History    Gravida Para Term Preterm AB TAB SAB Ectopic Multiple Living   7 5 4 1 2 2    5      Review of Systems  Constitutional: Negative for fever.  Respiratory: Negative for shortness of breath.   Cardiovascular: Negative for chest  pain, palpitations and leg swelling.  Neurological: Negative for headaches.  Psychiatric/Behavioral: The patient is nervous/anxious.   All other systems reviewed and are negative.     Allergies  Naproxen  Home Medications   Prior to Admission medications   Medication Sig Start Date End Date Taking? Authorizing Provider  acetaminophen (TYLENOL) 500 MG tablet Take 1,000 mg by mouth every 6 (six) hours as needed for pain.    Historical Provider, MD  albuterol (PROVENTIL HFA;VENTOLIN HFA) 108 (90 BASE) MCG/ACT inhaler Inhale 2 puffs into the lungs every 4 (four) hours as needed for wheezing or shortness of breath. 11/05/14   Janne Napoleon, NP  Diphenhyd-Hydrocort-Nystatin (FIRST-DUKES MOUTHWASH) SUSP Use as directed 5 mLs in the mouth or throat 4 (four) times daily. Swish and spit QID until mouth ulcer is gone  11/20/14   [name], MD  hydrochlorothiazide (HYDRODIURIL) 25 MG tablet TAKE ONE TABLET BY MOUTH ONCE DAILY 01/26/15   [name], MD  Naltrexone-Bupropion HCl ER (CONTRAVE) 8-90 MG TB12 Take 2 tablets by mouth 2 (two) times daily. 11/02/14   [name], MD  predniSONE (DELTASONE) 20 MG tablet 2 tabs po q d for 3 days then 1 tab q d for 3 days 11/05/14   Janne Napoleon, NP   BP 140/83 mmHg  Pulse 77  Temp(Src) 98.2 F (36.8 C) (Oral)  Resp 13  Ht 5\' 9"  (1.753 m)  Wt 242 lb 5 oz (109.912 kg)  BMI 35.77 kg/m2  SpO2 99%  LMP 10/10/2014   Physical Exam  Constitutional: She is oriented to person, place, and time. She appears well-developed and well-nourished. No distress.  HENT:  Head: Normocephalic and atraumatic.  Mouth/Throat: Oropharynx is clear and moist. No oropharyngeal exudate.  Eyes: Conjunctivae and EOM are normal. Pupils are equal, round, and reactive to light.  Neck: Normal range of motion. Neck supple. No tracheal deviation present.  Cardiovascular: Normal rate, regular rhythm and normal heart sounds.   Pulmonary/Chest: Effort normal and breath sounds normal. No respiratory distress. She has no wheezes. She has no rales.  Abdominal: Soft. Bowel sounds are normal. She exhibits no mass. There is no tenderness. There is no rebound and no guarding.  Constipation througout colon.   Musculoskeletal: Normal range of motion. She exhibits no edema or tenderness.  Neurological: She is alert and oriented to person, place, and time. She has normal reflexes.  Skin: Skin is warm and dry.  Psychiatric: Her behavior is normal. Her mood appears anxious.  Nursing note and vitals reviewed.   ED Course  Procedures (including critical care time) DIAGNOSTIC STUDIES: Oxygen Saturation is 99% on RA, normal by my interpretation.    COORDINATION OF CARE: 11:39 PM-Discussed treatment plan with pt at bedside and pt agreed to plan.     Labs Review Labs Reviewed  CBC WITH  DIFFERENTIAL/PLATELET - Abnormal; Notable for the following:    RBC 5.48 (*)    MCV 73.0 (*)    MCH 23.9 (*)    Neutrophils Relative % 33 (*)    Lymphocytes Relative 49 (*)    Monocytes Relative 13 (*)    All other components within normal limits  I-STAT CHEM 8, ED - Abnormal; Notable for the following:    Potassium 3.4 (*)    Chloride 100 (*)    Glucose, Bld 115 (*)    Calcium, Ion 1.32 (*)    All other components within normal limits  POC URINE PREG, ED  Randolm Idol, ED  Imaging Review No results found.   EKG Interpretation   Date/Time:  Sunday January 28 2015 23:58:30 EDT Ventricular Rate:  71 PR Interval:  150 QRS Duration: 86 QT Interval:  361 QTC Calculation: 392 R Axis:   43 Text Interpretation:  Sinus rhythm Confirmed by [facility]  MD, Kaniesha Barile  ([id number]) on 01/29/2015 12:57:22 AM      MDM   Final diagnoses:  None    Diet medication inducing a panic attack.  Speak with your MD in the am regarding coming off this medication    I personally performed the services described in this documentation, which was scribed in my presence. The recorded information has been reviewed and is accurate.       Veatrice Kells, MD 01/29/15 [phone]

## 2015-01-29 ENCOUNTER — Encounter (HOSPITAL_COMMUNITY): Payer: Self-pay | Admitting: Emergency Medicine

## 2015-01-29 LAB — I-STAT CHEM 8, ED
BUN: 13 mg/dL (ref 6–20)
CHLORIDE: 100 mmol/L — AB (ref 101–111)
CREATININE: 1 mg/dL (ref 0.44–1.00)
Calcium, Ion: 1.32 mmol/L — ABNORMAL HIGH (ref 1.12–1.23)
GLUCOSE: 115 mg/dL — AB (ref 65–99)
HCT: 43 % (ref 36.0–46.0)
Hemoglobin: 14.6 g/dL (ref 12.0–15.0)
Potassium: 3.4 mmol/L — ABNORMAL LOW (ref 3.5–5.1)
Sodium: 138 mmol/L (ref 135–145)
TCO2: 26 mmol/L (ref 0–100)

## 2015-01-29 LAB — CBC WITH DIFFERENTIAL/PLATELET
Basophils Absolute: 0.1 10*3/uL (ref 0.0–0.1)
Basophils Relative: 1 % (ref 0–1)
EOS ABS: 0.2 10*3/uL (ref 0.0–0.7)
Eosinophils Relative: 4 % (ref 0–5)
HEMATOCRIT: 40 % (ref 36.0–46.0)
HEMOGLOBIN: 13.1 g/dL (ref 12.0–15.0)
LYMPHS ABS: 2.7 10*3/uL (ref 0.7–4.0)
LYMPHS PCT: 49 % — AB (ref 12–46)
MCH: 23.9 pg — AB (ref 26.0–34.0)
MCHC: 32.8 g/dL (ref 30.0–36.0)
MCV: 73 fL — AB (ref 78.0–100.0)
MONO ABS: 0.7 10*3/uL (ref 0.1–1.0)
Monocytes Relative: 13 % — ABNORMAL HIGH (ref 3–12)
NEUTROS ABS: 1.8 10*3/uL (ref 1.7–7.7)
Neutrophils Relative %: 33 % — ABNORMAL LOW (ref 43–77)
Platelets: 228 10*3/uL (ref 150–400)
RBC: 5.48 MIL/uL — AB (ref 3.87–5.11)
RDW: 14 % (ref 11.5–15.5)
WBC: 5.5 10*3/uL (ref 4.0–10.5)

## 2015-01-29 LAB — I-STAT TROPONIN, ED: Troponin i, poc: 0 ng/mL (ref 0.00–0.08)

## 2015-01-29 LAB — POC URINE PREG, ED: Preg Test, Ur: NEGATIVE

## 2015-01-29 NOTE — ED Notes (Signed)
Pt. Left with all belongings and refused wheelchair 

## 2015-01-29 NOTE — Discharge Instructions (Signed)

## 2015-01-30 ENCOUNTER — Ambulatory Visit (INDEPENDENT_AMBULATORY_CARE_PROVIDER_SITE_OTHER): Payer: Federal, State, Local not specified - PPO | Admitting: Internal Medicine

## 2015-01-30 ENCOUNTER — Encounter: Payer: Self-pay | Admitting: Internal Medicine

## 2015-01-30 VITALS — BP 126/90 | HR 87 | Temp 98.7°F | Resp 16 | Ht 69.0 in | Wt 237.8 lb

## 2015-01-30 DIAGNOSIS — I10 Essential (primary) hypertension: Secondary | ICD-10-CM | POA: Diagnosis not present

## 2015-01-30 DIAGNOSIS — E669 Obesity, unspecified: Secondary | ICD-10-CM

## 2015-01-30 NOTE — Progress Notes (Signed)
Pre visit review using our clinic review tool, if applicable. No additional management support is needed unless otherwise documented below in the visit note. 

## 2015-01-30 NOTE — Patient Instructions (Signed)

## 2015-01-31 NOTE — Assessment & Plan Note (Signed)
Her BP is adequately well controlled 

## 2015-01-31 NOTE — Assessment & Plan Note (Signed)
It appears that her panic episode was caused by the increase in the contrave dose (wellbutrin most likely), she is anxiety-free at the lower dose so though this is not ideal, will keep contrave at the lower dose to avoid anxiety/panic

## 2015-01-31 NOTE — Progress Notes (Signed)
Subjective:  Patient ID: Mary Sanford, female    DOB: 1965/09/21  Age: 49 y.o. MRN: [id number]  CC: Hypertension   HPI Mary Sanford presents for follow up on panic attack - 2 days ago she took her PM dose of contrave for the first time and a few hours later she awakened with a panic attack, she was seen in the ER and she was released. She has felt fine since then.  Outpatient Prescriptions Prior to Visit  Medication Sig Dispense Refill  . albuterol (PROVENTIL HFA;VENTOLIN HFA) 108 (90 BASE) MCG/ACT inhaler Inhale 2 puffs into the lungs every 4 (four) hours as needed for wheezing or shortness of breath. 1 Inhaler 0  . hydrochlorothiazide (HYDRODIURIL) 25 MG tablet TAKE ONE TABLET BY MOUTH ONCE DAILY 90 tablet 1  . Naltrexone-Bupropion HCl ER (CONTRAVE) 8-90 MG TB12 Take 2 tablets by mouth 2 (two) times daily. 120 tablet 11  . acetaminophen (TYLENOL) 500 MG tablet Take 1,000 mg by mouth every 6 (six) hours as needed for pain.    . Diphenhyd-Hydrocort-Nystatin (FIRST-DUKES MOUTHWASH) SUSP Use as directed 5 mLs in the mouth or throat 4 (four) times daily. Swish and spit QID until mouth ulcer is gone (Patient not taking: Reported on 01/30/2015) 237 mL 1  . predniSONE (DELTASONE) 20 MG tablet 2 tabs po q d for 3 days then 1 tab q d for 3 days (Patient not taking: Reported on 01/30/2015) 9 tablet 0   Facility-Administered Medications Prior to Visit  Medication Dose Route Frequency Provider Last Rate Last Dose  . cyanocobalamin ((VITAMIN B-12)) injection 1,000 mcg  1,000 mcg Intramuscular Q14 Days [name], MD   1,000 mcg at 12/05/13 1651    ROS Review of Systems  Constitutional: Negative.  Negative for fever, chills, diaphoresis, appetite change and fatigue.  HENT: Negative.   Eyes: Negative.   Respiratory: Negative.  Negative for cough, choking, chest tightness, shortness of breath and stridor.   Cardiovascular: Negative.  Negative for chest pain, palpitations and leg swelling.    Gastrointestinal: Negative.  Negative for nausea, vomiting, abdominal pain, diarrhea, constipation and blood in stool.  Endocrine: Negative.   Genitourinary: Negative.   Musculoskeletal: Negative.   Skin: Negative.  Negative for rash.  Allergic/Immunologic: Negative.   Neurological: Negative.  Negative for dizziness, tremors, weakness, light-headedness and numbness.  Hematological: Negative.  Negative for adenopathy. Does not bruise/bleed easily.  Psychiatric/Behavioral: The patient is nervous/anxious.     Objective:  BP 126/90 mmHg  Pulse 87  Temp(Src) 98.7 F (37.1 C) (Oral)  Ht 5\' 9"  (1.753 m)  Wt 237 lb 12 oz (107.843 kg)  BMI 35.09 kg/m2  SpO2 97%  LMP 10/10/2014  BP Readings from Last 3 Encounters:  01/30/15 126/90  01/29/15 124/82  11/20/14 128/84    Wt Readings from Last 3 Encounters:  01/30/15 237 lb 12 oz (107.843 kg)  01/28/15 242 lb 5 oz (109.912 kg)  11/20/14 239 lb (108.41 kg)    Physical Exam  Constitutional: She is oriented to person, place, and time. She appears well-developed and well-nourished. No distress.  HENT:  Head: Normocephalic and atraumatic.  Mouth/Throat: Oropharynx is clear and moist. No oropharyngeal exudate.  Eyes: Conjunctivae are normal. Right eye exhibits no discharge. Left eye exhibits no discharge. No scleral icterus.  Neck: Normal range of motion. Neck supple. No JVD present. No tracheal deviation present. No thyromegaly present.  Cardiovascular: Normal rate, regular rhythm, normal heart sounds and intact distal pulses.  Exam reveals  no gallop and no friction rub.   No murmur heard. Pulmonary/Chest: Effort normal and breath sounds normal. No stridor. No respiratory distress. She has no wheezes. She has no rales. She exhibits no tenderness.  Abdominal: Soft. Bowel sounds are normal. She exhibits no distension and no mass. There is no tenderness. There is no rebound and no guarding.  Musculoskeletal: Normal range of motion. She  exhibits no edema or tenderness.  Lymphadenopathy:    She has no cervical adenopathy.  Neurological: She is oriented to person, place, and time.  Skin: Skin is warm and dry. No rash noted. She is not diaphoretic. No erythema. No pallor.  Psychiatric: She has a normal mood and affect. Her behavior is normal. Judgment and thought content normal.  Vitals reviewed.   Lab Results  Component Value Date   WBC 5.5 01/28/2015   HGB 14.6 01/29/2015   HCT 43.0 01/29/2015   PLT 228 01/28/2015   GLUCOSE 115* 01/29/2015   CHOL 117 01/12/2013   TRIG 64.0 01/12/2013   HDL 42.10 01/12/2013   LDLCALC 62 01/12/2013   ALT 32 11/02/2014   AST 21 11/02/2014   NA 138 01/29/2015   K 3.4* 01/29/2015   CL 100* 01/29/2015   CREATININE 1.00 01/29/2015   BUN 13 01/29/2015   CO2 33* 11/02/2014   TSH 1.05 12/05/2013   HGBA1C 6.4 11/02/2014    No results found.  Assessment & Plan:   There are no diagnoses linked to this encounter. I am having Ms. Tilly maintain her acetaminophen, Naltrexone-Bupropion HCl ER, albuterol, predniSONE, FIRST-DUKES MOUTHWASH, and hydrochlorothiazide. We will continue to administer cyanocobalamin.  No orders of the defined types were placed in this encounter.     Follow-up: Return in about 3 months (around 05/02/2015).  Scarlette Calico, MD

## 2015-02-08 ENCOUNTER — Ambulatory Visit: Payer: Federal, State, Local not specified - PPO

## 2015-02-27 ENCOUNTER — Ambulatory Visit (INDEPENDENT_AMBULATORY_CARE_PROVIDER_SITE_OTHER): Payer: Federal, State, Local not specified - PPO

## 2015-02-27 DIAGNOSIS — D519 Vitamin B12 deficiency anemia, unspecified: Secondary | ICD-10-CM

## 2015-02-27 MED ORDER — CYANOCOBALAMIN 1000 MCG/ML IJ SOLN
1000.0000 ug | Freq: Once | INTRAMUSCULAR | Status: AC
  Administered 2015-02-27: 1000 ug via SUBCUTANEOUS

## 2015-03-13 ENCOUNTER — Ambulatory Visit: Payer: Federal, State, Local not specified - PPO

## 2015-03-15 ENCOUNTER — Ambulatory Visit (INDEPENDENT_AMBULATORY_CARE_PROVIDER_SITE_OTHER): Payer: Federal, State, Local not specified - PPO | Admitting: Geriatric Medicine

## 2015-03-15 DIAGNOSIS — D519 Vitamin B12 deficiency anemia, unspecified: Secondary | ICD-10-CM

## 2015-03-15 MED ORDER — CYANOCOBALAMIN 1000 MCG/ML IJ SOLN
1000.0000 ug | Freq: Once | INTRAMUSCULAR | Status: AC
  Administered 2015-03-15: 1000 ug via INTRAMUSCULAR

## 2015-04-17 ENCOUNTER — Other Ambulatory Visit (INDEPENDENT_AMBULATORY_CARE_PROVIDER_SITE_OTHER): Payer: Federal, State, Local not specified - PPO

## 2015-04-17 ENCOUNTER — Encounter: Payer: Self-pay | Admitting: Internal Medicine

## 2015-04-17 ENCOUNTER — Ambulatory Visit (INDEPENDENT_AMBULATORY_CARE_PROVIDER_SITE_OTHER): Payer: Federal, State, Local not specified - PPO | Admitting: Internal Medicine

## 2015-04-17 VITALS — BP 118/78 | HR 100 | Temp 98.2°F | Resp 16 | Ht 69.0 in | Wt 232.0 lb

## 2015-04-17 DIAGNOSIS — R7309 Other abnormal glucose: Secondary | ICD-10-CM

## 2015-04-17 DIAGNOSIS — E876 Hypokalemia: Secondary | ICD-10-CM

## 2015-04-17 DIAGNOSIS — R7303 Prediabetes: Secondary | ICD-10-CM

## 2015-04-17 DIAGNOSIS — D509 Iron deficiency anemia, unspecified: Secondary | ICD-10-CM | POA: Diagnosis not present

## 2015-04-17 DIAGNOSIS — I10 Essential (primary) hypertension: Secondary | ICD-10-CM | POA: Diagnosis not present

## 2015-04-17 DIAGNOSIS — D519 Vitamin B12 deficiency anemia, unspecified: Secondary | ICD-10-CM | POA: Diagnosis not present

## 2015-04-17 LAB — CBC WITH DIFFERENTIAL/PLATELET
Basophils Absolute: 0.1 10*3/uL (ref 0.0–0.1)
Basophils Relative: 0.9 % (ref 0.0–3.0)
Eosinophils Absolute: 0.4 10*3/uL (ref 0.0–0.7)
Eosinophils Relative: 6.2 % — ABNORMAL HIGH (ref 0.0–5.0)
HEMATOCRIT: 44.6 % (ref 36.0–46.0)
HEMOGLOBIN: 14.4 g/dL (ref 12.0–15.0)
LYMPHS PCT: 33.6 % (ref 12.0–46.0)
Lymphs Abs: 2.2 10*3/uL (ref 0.7–4.0)
MCHC: 32.2 g/dL (ref 30.0–36.0)
MCV: 73.3 fl — AB (ref 78.0–100.0)
MONOS PCT: 11.9 % (ref 3.0–12.0)
Monocytes Absolute: 0.8 10*3/uL (ref 0.1–1.0)
NEUTROS ABS: 3.1 10*3/uL (ref 1.4–7.7)
Neutrophils Relative %: 47.4 % (ref 43.0–77.0)
Platelets: 277 10*3/uL (ref 150.0–400.0)
RBC: 6.08 Mil/uL — ABNORMAL HIGH (ref 3.87–5.11)
RDW: 13.8 % (ref 11.5–15.5)
WBC: 6.5 10*3/uL (ref 4.0–10.5)

## 2015-04-17 LAB — BASIC METABOLIC PANEL
BUN: 11 mg/dL (ref 6–23)
CALCIUM: 10 mg/dL (ref 8.4–10.5)
CO2: 30 meq/L (ref 19–32)
Chloride: 101 mEq/L (ref 96–112)
Creatinine, Ser: 0.82 mg/dL (ref 0.40–1.20)
GFR: 95.71 mL/min (ref >60.00)
Glucose, Bld: 101 mg/dL — ABNORMAL HIGH (ref 70–99)
Potassium: 3.7 mEq/L (ref 3.5–5.1)
SODIUM: 137 meq/L (ref 135–145)

## 2015-04-17 LAB — HEMOGLOBIN A1C: Hgb A1c MFr Bld: 6.1 % (ref 4.6–6.5)

## 2015-04-17 LAB — LIPID PANEL
Cholesterol: 141 mg/dL (ref 0–200)
HDL: 45.7 mg/dL (ref >39.00)
LDL Cholesterol: 59 mg/dL (ref 0–99)
NONHDL: 94.86
Total CHOL/HDL Ratio: 3
Triglycerides: 177 mg/dL — ABNORMAL HIGH (ref 0.0–149.0)
VLDL: 35.4 mg/dL (ref 0.0–40.0)

## 2015-04-17 MED ORDER — CYANOCOBALAMIN 1000 MCG/ML IJ SOLN
1000.0000 ug | Freq: Once | INTRAMUSCULAR | Status: AC
  Administered 2015-04-17: 1000 ug via INTRAMUSCULAR

## 2015-04-17 NOTE — Patient Instructions (Signed)

## 2015-04-17 NOTE — Progress Notes (Signed)
Pre visit review using our clinic review tool, if applicable. No additional management support is needed unless otherwise documented below in the visit note. 

## 2015-04-18 NOTE — Progress Notes (Signed)
Subjective:  Patient ID: Mary Sanford, female    DOB: 10-28-1965  Age: 49 y.o. MRN: [id number]  CC: Hypertension   HPI Mary Sanford presents for follow-up on high blood pressure. She has lost 6 pounds with Contrave but she has decided to stop taking it. She offers no complaints today.  Outpatient Prescriptions Prior to Visit  Medication Sig Dispense Refill  . albuterol (PROVENTIL HFA;VENTOLIN HFA) 108 (90 BASE) MCG/ACT inhaler Inhale 2 puffs into the lungs every 4 (four) hours as needed for wheezing or shortness of breath. 1 Inhaler 0  . hydrochlorothiazide (HYDRODIURIL) 25 MG tablet TAKE ONE TABLET BY MOUTH ONCE DAILY 90 tablet 1  . acetaminophen (TYLENOL) 500 MG tablet Take 1,000 mg by mouth every 6 (six) hours as needed for pain.    . Diphenhyd-Hydrocort-Nystatin (FIRST-DUKES MOUTHWASH) SUSP Use as directed 5 mLs in the mouth or throat 4 (four) times daily. Swish and spit QID until mouth ulcer is gone 237 mL 1  . Naltrexone-Bupropion HCl ER (CONTRAVE) 8-90 MG TB12 Take 2 tablets by mouth 2 (two) times daily. 120 tablet 11  . predniSONE (DELTASONE) 20 MG tablet 2 tabs po q d for 3 days then 1 tab q d for 3 days 9 tablet 0   Facility-Administered Medications Prior to Visit  Medication Dose Route Frequency Provider Last Rate Last Dose  . cyanocobalamin ((VITAMIN B-12)) injection 1,000 mcg  1,000 mcg Intramuscular Q14 Days [name], MD   1,000 mcg at 12/05/13 1651    ROS Review of Systems  Constitutional: Negative.  Negative for fever, chills, diaphoresis, appetite change and fatigue.  HENT: Negative.   Eyes: Negative.   Respiratory: Negative.  Negative for cough, choking, chest tightness, shortness of breath and stridor.   Cardiovascular: Negative.  Negative for chest pain, palpitations and leg swelling.  Gastrointestinal: Negative.  Negative for nausea, vomiting, abdominal pain, diarrhea, constipation and blood in stool.  Endocrine: Negative.   Genitourinary: Negative.    Musculoskeletal: Negative.  Negative for myalgias, back pain, joint swelling and arthralgias.  Skin: Negative.  Negative for rash.  Allergic/Immunologic: Negative.   Neurological: Negative.  Negative for dizziness, tremors, syncope, light-headedness, numbness and headaches.  Hematological: Negative.  Negative for adenopathy. Does not bruise/bleed easily.  Psychiatric/Behavioral: Negative.     Objective:  BP 118/78 mmHg  Pulse 100  Temp(Src) 98.2 F (36.8 C) (Oral)  Resp 16  Ht 5\' 9"  (1.753 m)  Wt 232 lb (105.235 kg)  BMI 34.24 kg/m2  SpO2 98%  LMP 04/08/2015  BP Readings from Last 3 Encounters:  04/17/15 118/78  01/30/15 126/90  01/29/15 124/82    Wt Readings from Last 3 Encounters:  04/17/15 232 lb (105.235 kg)  01/30/15 237 lb 12 oz (107.843 kg)  01/28/15 242 lb 5 oz (109.912 kg)    Physical Exam  Constitutional: She is oriented to person, place, and time. No distress.  HENT:  Mouth/Throat: Oropharynx is clear and moist. No oropharyngeal exudate.  Eyes: Conjunctivae are normal. Right eye exhibits no discharge. Left eye exhibits no discharge. No scleral icterus.  Neck: Normal range of motion. Neck supple. No JVD present. No tracheal deviation present. No thyromegaly present.  Cardiovascular: Normal rate, regular rhythm, normal heart sounds and intact distal pulses.  Exam reveals no gallop and no friction rub.   No murmur heard. Pulmonary/Chest: Effort normal and breath sounds normal. No stridor. No respiratory distress. She has no wheezes. She has no rales. She exhibits no tenderness.  Abdominal:  Soft. Bowel sounds are normal. She exhibits no distension and no mass. There is no tenderness. There is no rebound and no guarding.  Musculoskeletal: Normal range of motion. She exhibits no edema or tenderness.  Lymphadenopathy:    She has no cervical adenopathy.  Neurological: She is oriented to person, place, and time.  Skin: Skin is warm and dry. No rash noted. She is not  diaphoretic. No erythema. No pallor.  Vitals reviewed.   Lab Results  Component Value Date   WBC 6.5 04/17/2015   HGB 14.4 04/17/2015   HCT 44.6 04/17/2015   PLT 277.0 04/17/2015   GLUCOSE 101* 04/17/2015   CHOL 141 04/17/2015   TRIG 177.0* 04/17/2015   HDL 45.70 04/17/2015   LDLCALC 59 04/17/2015   ALT 32 11/02/2014   AST 21 11/02/2014   NA 137 04/17/2015   K 3.7 04/17/2015   CL 101 04/17/2015   CREATININE 0.82 04/17/2015   BUN 11 04/17/2015   CO2 30 04/17/2015   TSH 1.05 12/05/2013   HGBA1C 6.1 04/17/2015    No results found.  Assessment & Plan:   Oviya was seen today for hypertension.  Diagnoses and all orders for this visit:  Essential hypertension, benign- her blood pressure is well-controlled, electrolytes and renal function are stable. -     Basic metabolic panel; Future  Prediabetes- improvement noted with better lifestyle modifications. She will continue with a strict diet and exercise regimen. -     Lipid panel; Future -     Basic metabolic panel; Future -     Hemoglobin A1c; Future  Anemia, iron deficiency- improvement noted -     CBC with Differential/Platelet; Future  B12 deficiency anemia- improvement noted -     CBC with Differential/Platelet; Future -     cyanocobalamin ((VITAMIN B-12)) injection 1,000 mcg; Inject 1 mL (1,000 mcg total) into the muscle once.  Hypokalemia- her potassium level is in the normal range now. -     Basic metabolic panel; Future   I have discontinued Ms. Jesson's acetaminophen, Naltrexone-Bupropion HCl ER, predniSONE, and FIRST-DUKES MOUTHWASH. I am also having her maintain her albuterol, hydrochlorothiazide, KLOR-CON M10, and ibuprofen. We administered cyanocobalamin. We will continue to administer cyanocobalamin.  Meds ordered this encounter  Medications  . KLOR-CON M10 10 MEQ tablet    Sig:   . ibuprofen (ADVIL,MOTRIN) 200 MG tablet    Sig: Take 600 mg by mouth every 6 (six) hours as needed.  . cyanocobalamin  ((VITAMIN B-12)) injection 1,000 mcg    Sig:      Follow-up: Return in about 6 months (around 10/15/2015).  Scarlette Calico, MD

## 2015-04-26 ENCOUNTER — Encounter (HOSPITAL_COMMUNITY): Payer: Self-pay | Admitting: Emergency Medicine

## 2015-04-26 ENCOUNTER — Emergency Department (HOSPITAL_COMMUNITY): Payer: Federal, State, Local not specified - PPO

## 2015-04-26 ENCOUNTER — Emergency Department (HOSPITAL_COMMUNITY)
Admission: EM | Admit: 2015-04-26 | Discharge: 2015-04-26 | Disposition: A | Discharge status: Home / Self Care | Payer: Federal, State, Local not specified - PPO | Attending: Emergency Medicine | Admitting: Emergency Medicine

## 2015-04-26 DIAGNOSIS — I10 Essential (primary) hypertension: Secondary | ICD-10-CM | POA: Diagnosis not present

## 2015-04-26 DIAGNOSIS — Z87442 Personal history of urinary calculi: Secondary | ICD-10-CM | POA: Insufficient documentation

## 2015-04-26 DIAGNOSIS — Z8639 Personal history of other endocrine, nutritional and metabolic disease: Secondary | ICD-10-CM | POA: Insufficient documentation

## 2015-04-26 DIAGNOSIS — Z79899 Other long term (current) drug therapy: Secondary | ICD-10-CM | POA: Diagnosis not present

## 2015-04-26 DIAGNOSIS — Z8659 Personal history of other mental and behavioral disorders: Secondary | ICD-10-CM | POA: Diagnosis not present

## 2015-04-26 DIAGNOSIS — Z87891 Personal history of nicotine dependence: Secondary | ICD-10-CM | POA: Diagnosis not present

## 2015-04-26 DIAGNOSIS — R109 Unspecified abdominal pain: Secondary | ICD-10-CM | POA: Insufficient documentation

## 2015-04-26 DIAGNOSIS — R079 Chest pain, unspecified: Secondary | ICD-10-CM | POA: Diagnosis not present

## 2015-04-26 DIAGNOSIS — Z862 Personal history of diseases of the blood and blood-forming organs and certain disorders involving the immune mechanism: Secondary | ICD-10-CM | POA: Diagnosis not present

## 2015-04-26 LAB — CBC WITH DIFFERENTIAL/PLATELET
Basophils Absolute: 0.1 10*3/uL (ref 0.0–0.1)
Basophils Relative: 1 %
Eosinophils Absolute: 0.2 10*3/uL (ref 0.0–0.7)
Eosinophils Relative: 4 %
HCT: 41.8 % (ref 36.0–46.0)
Hemoglobin: 13.5 g/dL (ref 12.0–15.0)
Lymphocytes Relative: 45 %
Lymphs Abs: 2.9 10*3/uL (ref 0.7–4.0)
MCH: 23.4 pg — ABNORMAL LOW (ref 26.0–34.0)
MCHC: 32.3 g/dL (ref 30.0–36.0)
MCV: 72.6 fL — ABNORMAL LOW (ref 78.0–100.0)
Monocytes Absolute: 0.9 10*3/uL (ref 0.1–1.0)
Monocytes Relative: 13 %
Neutro Abs: 2.4 10*3/uL (ref 1.7–7.7)
Neutrophils Relative %: 38 %
Platelets: 236 10*3/uL (ref 150–400)
RBC: 5.76 MIL/uL — ABNORMAL HIGH (ref 3.87–5.11)
RDW: 13.8 % (ref 11.5–15.5)
WBC: 6.5 10*3/uL (ref 4.0–10.5)

## 2015-04-26 LAB — BASIC METABOLIC PANEL
Anion gap: 7 (ref 5–15)
BUN: 7 mg/dL (ref 6–20)
CO2: 25 mmol/L (ref 22–32)
Calcium: 9.4 mg/dL (ref 8.9–10.3)
Chloride: 103 mmol/L (ref 101–111)
Creatinine, Ser: 0.82 mg/dL (ref 0.44–1.00)
GFR calc Af Amer: >60 mL/min (ref >60)
GFR calc non Af Amer: >60 mL/min (ref >60)
Glucose, Bld: 106 mg/dL — ABNORMAL HIGH (ref 65–99)
Potassium: 3.7 mmol/L (ref 3.5–5.1)
Sodium: 135 mmol/L (ref 135–145)

## 2015-04-26 LAB — TROPONIN I: Troponin I: <0.03 ng/mL (ref <0.031)

## 2015-04-26 MED ORDER — SODIUM CHLORIDE 0.9 % IV BOLUS (SEPSIS)
1000.0000 mL | Freq: Once | INTRAVENOUS | Status: AC
  Administered 2015-04-26: 1000 mL via INTRAVENOUS

## 2015-04-26 MED ORDER — GI COCKTAIL ~~LOC~~
30.0000 mL | Freq: Once | ORAL | Status: AC
  Administered 2015-04-26: 30 mL via ORAL
  Filled 2015-04-26: qty 30

## 2015-04-26 MED ORDER — LORAZEPAM 2 MG/ML IJ SOLN
1.0000 mg | Freq: Once | INTRAMUSCULAR | Status: AC
  Administered 2015-04-26: 1 mg via INTRAVENOUS
  Filled 2015-04-26: qty 1

## 2015-04-26 MED ORDER — CIMETIDINE 200 MG PO TABS: 200.0000 mg | ORAL_TABLET | Freq: Two times a day (BID) | ORAL | Status: DC

## 2015-04-26 NOTE — ED Notes (Signed)
Pt left with all belongings and ambulated out of treatment area with family.  

## 2015-04-26 NOTE — ED Provider Notes (Signed)
CSN: [id number]     Arrival date & time 04/26/15  6378 History  This chart was scribed for Virgel Manifold, MD by Hansel Feinstein, ED Scribe. This patient was seen in room A04C/A04C and the patient's care was started at 3:50 AM.     Chief Complaint  Patient presents with  . Chest Pain   The history is provided by the patient. No language interpreter was used.    HPI Comments: Mary Sanford is a 49 y.o. female with hx of HTN (controlled by hydrochlorothiazide), anxiety who presents to the Emergency Department complaining of moderate, burning CP that radiates down the left arm onset this morning at 0315, awaking her from her sleep. Pt is also noting a general feeling of discomfort and dehydration. She states she did not feel any symptoms prior to going to sleep. She notes that pain is worsened in the supine position. Hx of anxiety attacks. She states that her symptoms are not similar to past anxiety symptoms. Pt states she had a stress test years ago. No Hx of GERD, CAD/CHF, similar CP. Pt is not taking medication for anxiety but has taken Wellbutrin in the past. She denies back pain, abdominal pain, nausea, current SOB, leg pain, leg swelling, burning in the throat.     Past Medical History  Diagnosis Date  . Anemia   . Hypertension   . Kidney stones   . B12 deficiency   . Depression     no meds  . Cough     non- productive  . Anxiety    Past Surgical History  Procedure Laterality Date  . Caesarean section    . Cystoscopic      extraction of ureteric calculus without disintegration  . Hemorrhoidectomy with hemorrhoid banding  10/19/2012    SCA THD Hem ligation/pexy  . Tubal ligation    . Wisdom tooth extraction    . Dilitation & currettage/hystroscopy with novasure ablation N/A 06/17/2013    Procedure: DILATATION & CURETTAGE/HYSTEROSCOPY WITH NOVASURE ABLATION;  Surgeon: Osborne Oman, MD;  Location: [facility];  Service: Gynecology;  Laterality: N/A;   Family History  Problem Relation  Age of Onset  . Asthma    . Hypothyroidism    . Early death Neg Hx   . Heart disease Paternal Grandfather     >55  . Hyperlipidemia Neg Hx   . Kidney disease Neg Hx   . Stroke Maternal Uncle     > 55  . Hypertension Mother   . Hypothyroidism Mother   . Asthma Mother   . Cancer Mother 44    Colon and rectal  . Hypotension Sister   . Hypothyroidism Sister   . Diabetes Sister   . Seizures Brother   . Hypothyroidism Sister   . Seizures Sister   . Anemia Sister   . Deep vein thrombosis Sister    Social History  Substance Use Topics  . Smoking status: Former Smoker    Types: Cigarettes    Quit date: 08/05/1999  . Smokeless tobacco: Never Used  . Alcohol Use: 4.2 oz/week    7 Glasses of wine per week   OB History    Gravida Para Term Preterm AB TAB SAB Ectopic Multiple Living   7 5 4 1 2 2    5      Review of Systems  Respiratory: Negative for shortness of breath.   Cardiovascular: Positive for chest pain. Negative for leg swelling.  Gastrointestinal: Positive for abdominal pain. Negative for nausea.  Musculoskeletal: Negative for back pain.  All other systems reviewed and are negative.  Allergies  Naproxen  Home Medications   Prior to Admission medications   Medication Sig Start Date End Date Taking? Authorizing Provider  albuterol (PROVENTIL HFA;VENTOLIN HFA) 108 (90 BASE) MCG/ACT inhaler Inhale 2 puffs into the lungs every 4 (four) hours as needed for wheezing or shortness of breath. 11/05/14   Janne Napoleon, NP  hydrochlorothiazide (HYDRODIURIL) 25 MG tablet TAKE ONE TABLET BY MOUTH ONCE DAILY 01/26/15   [name], MD  ibuprofen (ADVIL,MOTRIN) 200 MG tablet Take 600 mg by mouth every 6 (six) hours as needed.    Historical Provider, MD  KLOR-CON M10 10 MEQ tablet  03/26/15   Historical Provider, MD   BP 142/85 mmHg  Pulse 90  SpO2 100%  LMP 04/08/2015 Physical Exam  Constitutional: She appears well-developed and well-nourished. No distress.  HENT:  Head:  Normocephalic and atraumatic.  Mouth/Throat: Oropharynx is clear and moist. No oropharyngeal exudate.  Eyes: Conjunctivae and EOM are normal. Pupils are equal, round, and reactive to light. Right eye exhibits no discharge. Left eye exhibits no discharge. No scleral icterus.  Neck: Normal range of motion. Neck supple. No JVD present. No thyromegaly present.  Cardiovascular: Normal rate, regular rhythm, normal heart sounds and intact distal pulses.  Exam reveals no gallop and no friction rub.   No murmur heard. Pulmonary/Chest: Effort normal and breath sounds normal. No respiratory distress. She has no wheezes. She has no rales.  Abdominal: Soft. Bowel sounds are normal. She exhibits no distension and no mass. There is no tenderness.  Musculoskeletal: Normal range of motion. She exhibits no edema or tenderness.  Lymphadenopathy:    She has no cervical adenopathy.  Neurological: She is alert. Coordination normal.  Skin: Skin is warm and dry. No rash noted. No erythema.  Psychiatric: She has a normal mood and affect. Her behavior is normal.  Nursing note and vitals reviewed.   ED Course  Procedures (including critical care time) DIAGNOSTIC STUDIES: Oxygen Saturation is 100% on RA, normal by my interpretation.    COORDINATION OF CARE: 3:57 AM Discussed treatment plan with pt at bedside and pt agreed to plan.   Labs Review Labs Reviewed  CBC WITH DIFFERENTIAL/PLATELET - Abnormal; Notable for the following:    RBC 5.76 (*)    MCV 72.6 (*)    MCH 23.4 (*)    All other components within normal limits  BASIC METABOLIC PANEL - Abnormal; Notable for the following:    Glucose, Bld 106 (*)    All other components within normal limits  TROPONIN I    Imaging Review No results found.   Dg Chest 2 View  04/26/2015   CLINICAL DATA:  Burning sensation in the chest. Shortness of breath tonight.  EXAM: CHEST  2 VIEW  COMPARISON:  12/27/2013  FINDINGS: The heart size and mediastinal contours are  within normal limits. Both lungs are clear. The visualized skeletal structures are unremarkable.  IMPRESSION: No active cardiopulmonary disease.   Electronically Signed   By: Lucienne Capers M.D.   On: 04/26/2015 04:34  I have personally reviewed and evaluated these images and lab results as part of my medical decision-making.   EKG Interpretation   Date/Time:  Thursday April 26 2015 03:45:09 EDT Ventricular Rate:  93 PR Interval:  161 QRS Duration: 75 QT Interval:  343 QTC Calculation: 427 R Axis:   29 Text Interpretation:  Sinus rhythm Low voltage, precordial leads  Borderline  T wave abnormalities ED PHYSICIAN INTERPRETATION AVAILABLE IN  [facility] Confirmed by TEST, Record ([id number]) on 04/27/2015 7:02:47 AM      MDM   Final diagnoses:  Chest pain, unspecified chest pain type   I personally preformed the services scribed in my presence. The recorded information has been reviewed is accurate. Virgel Manifold, MD.     Virgel Manifold, MD 05/04/15 [phone]

## 2015-04-26 NOTE — Discharge Instructions (Signed)

## 2015-04-26 NOTE — ED Notes (Signed)
Pt stated that she was awoken this morning with chest pain. Pt stated the pain is a burning sensation on the left side of her chest that radiates down left chest. Pt denies SOB, N/V. Pt with history of anxiety.

## 2015-04-30 ENCOUNTER — Encounter: Payer: Self-pay | Admitting: Internal Medicine

## 2015-05-21 ENCOUNTER — Other Ambulatory Visit: Payer: Self-pay | Admitting: Internal Medicine

## 2015-05-22 ENCOUNTER — Ambulatory Visit (INDEPENDENT_AMBULATORY_CARE_PROVIDER_SITE_OTHER): Payer: Federal, State, Local not specified - PPO | Admitting: *Deleted

## 2015-05-22 DIAGNOSIS — E538 Deficiency of other specified B group vitamins: Secondary | ICD-10-CM

## 2015-05-22 MED ORDER — CYANOCOBALAMIN 1000 MCG/ML IJ SOLN
1000.0000 ug | Freq: Once | INTRAMUSCULAR | Status: AC
  Administered 2015-05-22: 1000 ug via INTRAMUSCULAR

## 2015-05-29 ENCOUNTER — Telehealth: Payer: Self-pay | Admitting: Internal Medicine

## 2015-05-29 NOTE — Telephone Encounter (Signed)
Pt called in and is wanting to know if she can get a contrave saving card?

## 2015-05-30 NOTE — Telephone Encounter (Signed)
yes

## 2015-05-30 NOTE — Telephone Encounter (Signed)
Calling to notify pt of savings card at the front

## 2015-05-31 NOTE — Telephone Encounter (Signed)
2nd attempt

## 2015-06-01 NOTE — Telephone Encounter (Signed)
Third attempt closin note. Inform pt card is at front if calls back

## 2015-06-06 ENCOUNTER — Ambulatory Visit (INDEPENDENT_AMBULATORY_CARE_PROVIDER_SITE_OTHER): Payer: Federal, State, Local not specified - PPO

## 2015-06-06 DIAGNOSIS — E538 Deficiency of other specified B group vitamins: Secondary | ICD-10-CM

## 2015-06-06 MED ORDER — CYANOCOBALAMIN 1000 MCG/ML IJ SOLN
1000.0000 ug | Freq: Once | INTRAMUSCULAR | Status: AC
  Administered 2015-06-06: 1000 ug via INTRAMUSCULAR

## 2015-06-20 ENCOUNTER — Ambulatory Visit (INDEPENDENT_AMBULATORY_CARE_PROVIDER_SITE_OTHER): Payer: Federal, State, Local not specified - PPO

## 2015-06-20 DIAGNOSIS — E538 Deficiency of other specified B group vitamins: Secondary | ICD-10-CM

## 2015-06-20 MED ORDER — CYANOCOBALAMIN 1000 MCG/ML IJ SOLN
1000.0000 ug | Freq: Once | INTRAMUSCULAR | Status: AC
  Administered 2015-06-20: 1000 ug via INTRAMUSCULAR

## 2015-06-21 ENCOUNTER — Encounter: Payer: Self-pay | Admitting: Family Medicine

## 2015-06-21 ENCOUNTER — Ambulatory Visit (INDEPENDENT_AMBULATORY_CARE_PROVIDER_SITE_OTHER)
Admission: RE | Admit: 2015-06-21 | Discharge: 2015-06-21 | Disposition: A | Discharge status: Home / Self Care | Payer: Federal, State, Local not specified - PPO | Source: Ambulatory Visit | Attending: Family Medicine | Admitting: Family Medicine

## 2015-06-21 ENCOUNTER — Ambulatory Visit (INDEPENDENT_AMBULATORY_CARE_PROVIDER_SITE_OTHER): Payer: Federal, State, Local not specified - PPO | Admitting: Family Medicine

## 2015-06-21 ENCOUNTER — Other Ambulatory Visit (INDEPENDENT_AMBULATORY_CARE_PROVIDER_SITE_OTHER): Payer: Federal, State, Local not specified - PPO

## 2015-06-21 VITALS — BP 116/82 | Ht 69.0 in | Wt 231.0 lb

## 2015-06-21 DIAGNOSIS — S6982XA Other specified injuries of left wrist, hand and finger(s), initial encounter: Secondary | ICD-10-CM | POA: Diagnosis not present

## 2015-06-21 DIAGNOSIS — S66802A Unspecified injury of other specified muscles, fascia and tendons at wrist and hand level, left hand, initial encounter: Secondary | ICD-10-CM

## 2015-06-21 DIAGNOSIS — M79642 Pain in left hand: Secondary | ICD-10-CM

## 2015-06-21 DIAGNOSIS — M654 Radial styloid tenosynovitis [de Quervain]: Secondary | ICD-10-CM

## 2015-06-21 NOTE — Assessment & Plan Note (Signed)
Patient given injection today and tolerated the procedure very well. I do believe the patient does have an injury to the ulnar collateral ligament and this will give her some instability and probably advance her arthritis prematurely. Patient is going to do more of the bracing, home exercises, icing protocol. Patient will come back and see me again in 3 weeks. Patient continues to have instability I would either consider further workup versus possibly referral to a hand specialist.

## 2015-06-21 NOTE — Progress Notes (Signed)
Pre visit review using our clinic review tool, if applicable. No additional management support is needed unless otherwise documented below in the visit note. 

## 2015-06-21 NOTE — Assessment & Plan Note (Signed)
Patient placed in a thumb spica splint and will wear this 23 hours a day for the next 3 weeks. We discussed icing and topical anti-inflammatories. Learn some mild home exercises and range of motion. Patient will come back and see me again in 3 weeks for further evaluation and treatment.if continued instability we will either consider a MR arthrogram versus potentially referral to a hand specialist.

## 2015-06-21 NOTE — Progress Notes (Signed)
[facility] [place] [address] [place], [place] [address] Phone: [phone] Subjective:    I'm seeing this patient by the request  of:  Scarlette Calico, MD   CC: left wrist pain  [id number] Mary Sanford is a 49 y.o. female coming in with complaint of left wrist pain. Patient states that this is been numerous months if not years. Patient states that it seems to be worsening. Now having weakness and having difficulty with grip. Such things as opening up a cam is significantly hard. Seems to have a shooting pain that goes from her thumb up at the wrist. Does not rib or any true injury. Rates the severity of pain is when it occurs is 8 out of 10. Patient did try to get a over-the-counter strap as well as tried anti-inflammatories with minimal improvement. Denies any numbness. Feels though that she is getting weaker.  Past Medical History  Diagnosis Date  . Anemia   . Hypertension   . Kidney stones   . B12 deficiency   . Depression     no meds  . Cough     non- productive  . Anxiety    Past Surgical History  Procedure Laterality Date  . Caesarean section    . Cystoscopic      extraction of ureteric calculus without disintegration  . Hemorrhoidectomy with hemorrhoid banding  10/19/2012    SCA THD Hem ligation/pexy  . Tubal ligation    . Wisdom tooth extraction    . Dilitation & currettage/hystroscopy with novasure ablation N/A 06/17/2013    Procedure: DILATATION & CURETTAGE/HYSTEROSCOPY WITH NOVASURE ABLATION;  Surgeon: Osborne Oman, MD;  Location: [facility];  Service: Gynecology;  Laterality: N/A;   Social History  Substance Use Topics  . Smoking status: Former Smoker    Types: Cigarettes    Quit date: 08/05/1999  . Smokeless tobacco: Never Used  . Alcohol Use: 4.2 oz/week    7 Glasses of wine per week   Allergies  Allergen Reactions  . Naproxen     Panic attacks   Family History  Problem Relation Age of Onset  . Asthma    .  Hypothyroidism    . Early death Neg Hx   . Heart disease Paternal Grandfather     >55  . Hyperlipidemia Neg Hx   . Kidney disease Neg Hx   . Stroke Maternal Uncle     > 55  . Hypertension Mother   . Hypothyroidism Mother   . Asthma Mother   . Cancer Mother 75    Colon and rectal  . Hypotension Sister   . Hypothyroidism Sister   . Diabetes Sister   . Seizures Brother   . Hypothyroidism Sister   . Seizures Sister   . Anemia Sister   . Deep vein thrombosis Sister         Past medical history, social, surgical and family history all reviewed in electronic medical record.   Review of Systems: No headache, visual changes, nausea, vomiting, diarrhea, constipation, dizziness, abdominal pain, skin rash, fevers, chills, night sweats, weight loss, swollen lymph nodes, body aches, joint swelling, muscle aches, chest pain, shortness of breath, mood changes.   Objective Blood pressure 116/82, height 5\' 9"  (1.753 m), weight 231 lb (104.781 kg).  General: No apparent distress alert and oriented x3 mood and affect normal, dressed appropriately.  HEENT: Pupils equal, extraocular movements intact  Respiratory: Patient's speak in full sentences and does not appear short of  breath  Cardiovascular: No lower extremity edema, non tender, no erythema  Skin: Warm dry intact with no signs of infection or rash on extremities or on axial skeleton.  Abdomen: Soft nontender  Neuro: Cranial nerves II through XII are intact, neurovascularly intact in all extremities with 2+ DTRs and 2+ pulses.  Lymph: No lymphadenopathy of posterior or anterior cervical chain or axillae bilaterally.  Gait normal with good balance and coordination.  MSK:  Non tender with full range of motion and good stability and symmetric strength and tone of shoulders, elbows, hip, knee and ankles bilaterally.  [place] Inspection normal with no visible erythema or swelling.palpation the patient does have what appears to be a stinger  nodule in the palmar aspect of the thumb. ROM smooth and normal with good flexion and extension and ulnar/radial deviation that is symmetrical with opposite wrist. Palpation is normal over metacarpals, navicular, lunate, and TFCC; tendons without tenderness/ swelling No snuffbox tenderness. No tenderness over Canal of [place]. Strength 5/5 in all directions without pain. Positive Finkelstein's negative Tinel's Patient does have instability with significant gapping of the UCL against resistance. Contralateral wrist unremarkable  MSK US performed of: left wrist This study was ordered, performed, and interpreted by Charlann Boxer D.O.  Wrist: All extensor compartments Hypoechoic changes of the abductor pollicis longus tendon sheath. No true tear appreciated. TFCC intact. Scapholunate ligament intact. Carpal tunnel visualized and median nerve area normal, flexor tendons all normal in appearance without fraying, tears, or sheath effusions. Power doppler signal normal. Patient does have what appears to be a complete rupture of the UCL with a possible stinger lesion. Seems to be extra-articular. Seems to be though under the thenar eminence muscle.mild underlying arthritis of the [facility] joint  IMPRESSION:  : De Quervain's tenosynovitis and UCL tear  Procedure: Real-time Ultrasound Guided Injection of left abductor pollicis longus tendon sheath Device: GE Logiq E  Ultrasound guided injection is preferred based studies that show increased duration, increased effect, greater accuracy, decreased procedural pain, increased response rate, and decreased cost with ultrasound guided versus blind injection.  Verbal informed consent obtained.  Time-out conducted.  Noted no overlying erythema, induration, or other signs of local infection.  Skin prepped in a sterile fashion.  Local anesthesia: Topical Ethyl chloride.  With sterile technique and under real time ultrasound guidance:  With a 25-gauge 1 inch needle  patient was injected with a total of 0.5 mL of 0.5% Marcaine and 0.5 mL of Kenalog 41 g/dL in the tendon sheath Completed without difficulty  Pain immediately resolved suggesting accurate placement of the medication.  Advised to call if fevers/chills, erythema, induration, drainage, or persistent bleeding.  Images permanently stored and available for review in the ultrasound unit.  Impression: Technically successful ultrasound guided injection.   Procedure note [id number]; 15 minutes spent for Therapeutic exercises as stated in above notes.  This included exercises focusing on stretching, strengthening, with significant focus on eccentric aspects.   Abduction, flexion, range of motion exercises with hammer for supination and pronation.Proper technique shown and discussed handout in great detail with ATC.  All questions were discussed and answered.     Impression and Recommendations:     This case required medical decision making of moderate complexity.

## 2015-06-21 NOTE — Patient Instructions (Signed)
Good to see you [name] tenosynovitis and UCL injury of the thumb.  Ice 20 minutes 2 times daily. Usually after activity and before bed. Wear brace day and night for 3 weeks pennsaid pinkie amount topically 2 times daily as needed.  We will see how we are doing in 3 weeks and make sure you are healing Happy holidays!

## 2015-07-03 ENCOUNTER — Other Ambulatory Visit: Payer: Self-pay | Admitting: Internal Medicine

## 2015-07-04 ENCOUNTER — Ambulatory Visit (INDEPENDENT_AMBULATORY_CARE_PROVIDER_SITE_OTHER): Payer: Federal, State, Local not specified - PPO

## 2015-07-04 ENCOUNTER — Emergency Department (HOSPITAL_COMMUNITY)
Admission: EM | Admit: 2015-07-04 | Discharge: 2015-07-05 | Disposition: A | Discharge status: Home / Self Care | Payer: Federal, State, Local not specified - PPO | Attending: Emergency Medicine | Admitting: Emergency Medicine

## 2015-07-04 ENCOUNTER — Encounter (HOSPITAL_COMMUNITY): Payer: Self-pay | Admitting: Emergency Medicine

## 2015-07-04 DIAGNOSIS — Z8659 Personal history of other mental and behavioral disorders: Secondary | ICD-10-CM | POA: Insufficient documentation

## 2015-07-04 DIAGNOSIS — Z87891 Personal history of nicotine dependence: Secondary | ICD-10-CM | POA: Diagnosis not present

## 2015-07-04 DIAGNOSIS — D649 Anemia, unspecified: Secondary | ICD-10-CM | POA: Diagnosis not present

## 2015-07-04 DIAGNOSIS — E538 Deficiency of other specified B group vitamins: Secondary | ICD-10-CM

## 2015-07-04 DIAGNOSIS — Z87442 Personal history of urinary calculi: Secondary | ICD-10-CM | POA: Diagnosis not present

## 2015-07-04 DIAGNOSIS — I10 Essential (primary) hypertension: Secondary | ICD-10-CM | POA: Insufficient documentation

## 2015-07-04 DIAGNOSIS — Z79899 Other long term (current) drug therapy: Secondary | ICD-10-CM | POA: Diagnosis not present

## 2015-07-04 DIAGNOSIS — R42 Dizziness and giddiness: Secondary | ICD-10-CM | POA: Insufficient documentation

## 2015-07-04 LAB — URINALYSIS, ROUTINE W REFLEX MICROSCOPIC
Bilirubin Urine: NEGATIVE
Glucose, UA: NEGATIVE mg/dL
HGB URINE DIPSTICK: NEGATIVE
Ketones, ur: NEGATIVE mg/dL
LEUKOCYTES UA: NEGATIVE
NITRITE: NEGATIVE
PH: 6.5 (ref 5.0–8.0)
Protein, ur: NEGATIVE mg/dL
SPECIFIC GRAVITY, URINE: 1.008 (ref 1.005–1.030)

## 2015-07-04 LAB — CBC WITH DIFFERENTIAL/PLATELET
BASOS PCT: 1 %
Basophils Absolute: 0.1 10*3/uL (ref 0.0–0.1)
Eosinophils Absolute: 0.2 10*3/uL (ref 0.0–0.7)
Eosinophils Relative: 3 %
HEMATOCRIT: 42.1 % (ref 36.0–46.0)
HEMOGLOBIN: 13.4 g/dL (ref 12.0–15.0)
LYMPHS ABS: 3.4 10*3/uL (ref 0.7–4.0)
Lymphocytes Relative: 56 %
MCH: 23.3 pg — AB (ref 26.0–34.0)
MCHC: 31.8 g/dL (ref 30.0–36.0)
MCV: 73.3 fL — ABNORMAL LOW (ref 78.0–100.0)
MONOS PCT: 10 %
Monocytes Absolute: 0.6 10*3/uL (ref 0.1–1.0)
NEUTROS ABS: 1.9 10*3/uL (ref 1.7–7.7)
NEUTROS PCT: 31 %
Platelets: 249 10*3/uL (ref 150–400)
RBC: 5.74 MIL/uL — AB (ref 3.87–5.11)
RDW: 13.6 % (ref 11.5–15.5)
WBC: 6.1 10*3/uL (ref 4.0–10.5)

## 2015-07-04 LAB — BASIC METABOLIC PANEL
Anion gap: 6 (ref 5–15)
BUN: 11 mg/dL (ref 6–20)
CHLORIDE: 103 mmol/L (ref 101–111)
CO2: 30 mmol/L (ref 22–32)
CREATININE: 0.91 mg/dL (ref 0.44–1.00)
Calcium: 10 mg/dL (ref 8.9–10.3)
GFR calc non Af Amer: >60 mL/min (ref >60)
Glucose, Bld: 110 mg/dL — ABNORMAL HIGH (ref 65–99)
POTASSIUM: 3.4 mmol/L — AB (ref 3.5–5.1)
Sodium: 139 mmol/L (ref 135–145)

## 2015-07-04 MED ORDER — CYANOCOBALAMIN 1000 MCG/ML IJ SOLN
1000.0000 ug | Freq: Once | INTRAMUSCULAR | Status: AC
  Administered 2015-07-04: 1000 ug via INTRAMUSCULAR

## 2015-07-04 MED ORDER — KETOROLAC TROMETHAMINE 30 MG/ML IJ SOLN
30.0000 mg | Freq: Once | INTRAMUSCULAR | Status: AC
  Administered 2015-07-04: 30 mg via INTRAMUSCULAR
  Filled 2015-07-04: qty 1

## 2015-07-04 NOTE — ED Provider Notes (Signed)
CSN: [id number]     Arrival date & time 07/04/15  2219 History  By signing my name below, I, Sonum Patel, attest that this documentation has been prepared under the direction and in the presence of Merryl Hacker, MD. Electronically Signed: Sonum Patel, Education administrator. 07/04/2015. 11:14 PM.    Chief Complaint  Patient presents with  . Hypertension   The history is provided by the patient. No language interpreter was used.     HPI Comments: Mary Sanford is a 49 y.o. female with past medical history of HTN who presents to the Emergency Department complaining of unchanged lightheadedness which she describes as "wooziness" that began earlier today. She reports her blood pressure was elevated at 145/104 earlier this evening and came to the ED for evaluation. She has a history of HTN and takes HCTZ; last dose was this morning and patient states she has been compliant with this medication. She states her blood pressure typically stays around 120's/80's when taking the HCTZ. She reports mild left sided head discomfort which she rates as 0-1/10. She reports increased stress which she states has been ongoing. She denies CP, SOB, fever, cough, abdominal pain, nausea, vomiting, photophobia, room-spinning dizziness, dysuria, hematuria.   Past Medical History  Diagnosis Date  . Anemia   . Hypertension   . Kidney stones   . B12 deficiency   . Depression     no meds  . Cough     non- productive  . Anxiety    Past Surgical History  Procedure Laterality Date  . Caesarean section    . Cystoscopic      extraction of ureteric calculus without disintegration  . Hemorrhoidectomy with hemorrhoid banding  10/19/2012    SCA THD Hem ligation/pexy  . Tubal ligation    . Wisdom tooth extraction    . Dilitation & currettage/hystroscopy with novasure ablation N/A 06/17/2013    Procedure: DILATATION & CURETTAGE/HYSTEROSCOPY WITH NOVASURE ABLATION;  Surgeon: Osborne Oman, MD;  Location: [facility];  Service:  Gynecology;  Laterality: N/A;   Family History  Problem Relation Age of Onset  . Asthma    . Hypothyroidism    . Early death Neg Hx   . Heart disease Paternal Grandfather     >55  . Hyperlipidemia Neg Hx   . Kidney disease Neg Hx   . Stroke Maternal Uncle     > 55  . Hypertension Mother   . Hypothyroidism Mother   . Asthma Mother   . Cancer Mother 24    Colon and rectal  . Hypotension Sister   . Hypothyroidism Sister   . Diabetes Sister   . Seizures Brother   . Hypothyroidism Sister   . Seizures Sister   . Anemia Sister   . Deep vein thrombosis Sister    Social History  Substance Use Topics  . Smoking status: Former Smoker    Types: Cigarettes    Quit date: 08/05/1999  . Smokeless tobacco: Never Used  . Alcohol Use: Yes   OB History    Gravida Para Term Preterm AB TAB SAB Ectopic Multiple Living   7 5 4 1 2 2    5      Review of Systems  Constitutional: Negative for fever.  Respiratory: Negative for cough and shortness of breath.   Cardiovascular: Negative for chest pain.  Gastrointestinal: Negative for nausea, vomiting and abdominal pain.  Genitourinary: Negative for dysuria and hematuria.  Neurological: Positive for light-headedness and headaches. Negative for dizziness.  All other systems reviewed and are negative.     Allergies  Naproxen  Home Medications   Prior to Admission medications   Medication Sig Start Date End Date Taking? Authorizing Provider  albuterol (PROVENTIL HFA;VENTOLIN HFA) 108 (90 BASE) MCG/ACT inhaler Inhale 2 puffs into the lungs every 4 (four) hours as needed for wheezing or shortness of breath. 11/05/14  Yes Janne Napoleon, NP  CONTRAVE 8-90 MG TB12 Take 2 tablets by mouth daily. 03/27/15  Yes Historical Provider, MD  cyanocobalamin (,VITAMIN B-12,) 1000 MCG/ML injection Inject 1,000 mcg into the muscle every 14 (fourteen) days.   Yes Historical Provider, MD  hydrochlorothiazide (HYDRODIURIL) 25 MG tablet TAKE ONE TABLET BY MOUTH ONCE  DAILY 07/03/15  Yes [name], MD  ibuprofen (ADVIL,MOTRIN) 200 MG tablet Take 600 mg by mouth every 6 (six) hours as needed for headache or moderate pain.    Yes Historical Provider, MD  KLOR-CON M10 10 MEQ tablet TAKE ONE TABLET BY MOUTH TWICE DAILY 05/21/15  Yes [name], MD   BP 127/78 mmHg  Pulse 84  Temp(Src) 98.1 F (36.7 C) (Oral)  Resp 16  Ht 5\' 9"  (1.753 m)  Wt 233 lb (105.688 kg)  BMI 34.39 kg/m2  SpO2 95%  LMP 06/20/2015 (Approximate) Physical Exam  Constitutional: She is oriented to person, place, and time. She appears well-developed and well-nourished. No distress.  HENT:  Head: Normocephalic and atraumatic.  Eyes: EOM are normal. Pupils are equal, round, and reactive to light.  Neck: Normal range of motion. Neck supple.  Cardiovascular: Normal rate, regular rhythm and normal heart sounds.   No murmur heard. Pulmonary/Chest: Effort normal and breath sounds normal. No respiratory distress. She has no wheezes.  Abdominal: Soft. Bowel sounds are normal.  Neurological: She is alert and oriented to person, place, and time.  Cranial nerves II through VII intact, 5 out of 5 strength in all 4 extremities  Skin: Skin is warm and dry.  Psychiatric: She has a normal mood and affect.  Nursing note and vitals reviewed.   ED Course  Procedures (including critical care time)  DIAGNOSTIC STUDIES: Oxygen Saturation is 99% on RA, normal by my interpretation.    COORDINATION OF CARE: 11:19 PM Discussed treatment plan with pt at bedside and pt agreed to plan.   Labs Review Labs Reviewed  CBC WITH DIFFERENTIAL/PLATELET - Abnormal; Notable for the following:    RBC 5.74 (*)    MCV 73.3 (*)    MCH 23.3 (*)    All other components within normal limits  BASIC METABOLIC PANEL - Abnormal; Notable for the following:    Potassium 3.4 (*)    Glucose, Bld 110 (*)    All other components within normal limits  URINALYSIS, ROUTINE W REFLEX MICROSCOPIC (NOT AT [facility])     Imaging Review No results found. I have personally reviewed and evaluated these lab results as part of my medical decision-making.   EKG Interpretation   Date/Time:  Wednesday July 04 2015 23:47:26 EST Ventricular Rate:  82 PR Interval:  149 QRS Duration: 89 QT Interval:  376 QTC Calculation: 439 R Axis:   39 Text Interpretation:  Sinus rhythm Borderline T abnormalities, inferior  leads Confirmed by HORTON  MD, COURTNEY ([id number]) on 07/04/2015 11:53:53 PM      MDM   Final diagnoses:  Essential hypertension  Lightheadedness    Patient presents with generally not feeling well and high blood pressure readings at home. She is on blood pressure medication.  She is nontoxic and nonfocal. Currently she is in very minimal pain. No signs or symptoms of meningismus. Denies worse headache of her life. Blood pressures here have been between [id number] and [id number] systolic and [id number] diastolic. These are generally reassuring. Patient does report increased stress in her life. She was given Toradol for her headache. Basic labwork obtained and reassuring. Orthostatics are negative. Patient was reassured. Follow-up with her primary physician.  After history, exam, and medical workup I feel the patient has been appropriately medically screened and is safe for discharge home. Pertinent diagnoses were discussed with the patient. Patient was given return precautions.  I personally performed the services described in this documentation, which was scribed in my presence. The recorded information has been reviewed and is accurate.   Merryl Hacker, MD 07/05/15 [phone]

## 2015-07-04 NOTE — ED Notes (Signed)
Pt. reports elevated blood pressure = 145/105 at home this evening with mild headache .

## 2015-07-05 NOTE — Discharge Instructions (Signed)
Hypertension Hypertension, commonly called high blood pressure, is when the force of blood pumping through your arteries is too strong. Your arteries are the blood vessels that carry blood from your heart throughout your body. A blood pressure reading consists of a higher number over a lower number, such as 110/72. The higher number (systolic) is the pressure inside your arteries when your heart pumps. The lower number (diastolic) is the pressure inside your arteries when your heart relaxes. Ideally you want your blood pressure below 120/80. Hypertension forces your heart to work harder to pump blood. Your arteries may become narrow or stiff. Having untreated or uncontrolled hypertension can cause heart attack, stroke, kidney disease, and other problems. RISK FACTORS Some risk factors for high blood pressure are controllable. Others are not.  Risk factors you cannot control include:   Race. You may be at higher risk if you are African American.  Age. Risk increases with age.  Gender. Men are at higher risk than women before age 45 years. After age 65, women are at higher risk than men. Risk factors you can control include:  Not getting enough exercise or physical activity.  Being overweight.  Getting too much fat, sugar, calories, or salt in your diet.  Drinking too much alcohol. SIGNS AND SYMPTOMS Hypertension does not usually cause signs or symptoms. Extremely high blood pressure (hypertensive crisis) may cause headache, anxiety, shortness of breath, and nosebleed. DIAGNOSIS To check if you have hypertension, your health care provider will measure your blood pressure while you are seated, with your arm held at the level of your heart. It should be measured at least twice using the same arm. Certain conditions can cause a difference in blood pressure between your right and left arms. A blood pressure reading that is higher than normal on one occasion does not mean that you need treatment. If  it is not clear whether you have high blood pressure, you may be asked to return on a different day to have your blood pressure checked again. Or, you may be asked to monitor your blood pressure at home for 1 or more weeks. TREATMENT Treating high blood pressure includes making lifestyle changes and possibly taking medicine. Living a healthy lifestyle can help lower high blood pressure. You may need to change some of your habits. Lifestyle changes may include:  Following the DASH diet. This diet is high in fruits, vegetables, and whole grains. It is low in salt, red meat, and added sugars.  Keep your sodium intake below 2,300 mg per day.  Getting at least 30-45 minutes of aerobic exercise at least 4 times per week.  Losing weight if necessary.  Not smoking.  Limiting alcoholic beverages.  Learning ways to reduce stress. Your health care provider may prescribe medicine if lifestyle changes are not enough to get your blood pressure under control, and if one of the following is true:  You are 18-59 years of age and your systolic blood pressure is above 140.  You are 60 years of age or older, and your systolic blood pressure is above 150.  Your diastolic blood pressure is above 90.  You have diabetes, and your systolic blood pressure is over 140 or your diastolic blood pressure is over 90.  You have kidney disease and your blood pressure is above 140/90.  You have heart disease and your blood pressure is above 140/90. Your personal target blood pressure may vary depending on your medical conditions, your age, and other factors. HOME CARE INSTRUCTIONS    Have your blood pressure rechecked as directed by your health care provider.   Take medicines only as directed by your health care provider. Follow the directions carefully. Blood pressure medicines must be taken as prescribed. The medicine does not work as well when you skip doses. Skipping doses also puts you at risk for  problems.  Do not smoke.   Monitor your blood pressure at home as directed by your health care provider. SEEK MEDICAL CARE IF:   You think you are having a reaction to medicines taken.  You have recurrent headaches or feel dizzy.  You have swelling in your ankles.  You have trouble with your vision. SEEK IMMEDIATE MEDICAL CARE IF:  You develop a severe headache or confusion.  You have unusual weakness, numbness, or feel faint.  You have severe chest or abdominal pain.  You vomit repeatedly.  You have trouble breathing. MAKE SURE YOU:   Understand these instructions.  Will watch your condition.  Will get help right away if you are not doing well or get worse.   This information is not intended to replace advice given to you by your health care provider. Make sure you discuss any questions you have with your health care provider.   Document Released: 07/21/2005 Document Revised: 12/05/2014 Document Reviewed: 05/13/2013 Elsevier Interactive Patient Education 2016 [employer]  

## 2015-07-05 NOTE — ED Notes (Signed)
Patient verbalized understanding of discharge instructions and denies any further needs or questions at this time. VS stable. Assisted patient to ED lobby.

## 2015-07-09 ENCOUNTER — Ambulatory Visit (INDEPENDENT_AMBULATORY_CARE_PROVIDER_SITE_OTHER): Payer: Federal, State, Local not specified - PPO | Admitting: Family Medicine

## 2015-07-09 ENCOUNTER — Encounter: Payer: Self-pay | Admitting: Family Medicine

## 2015-07-09 ENCOUNTER — Other Ambulatory Visit (INDEPENDENT_AMBULATORY_CARE_PROVIDER_SITE_OTHER): Payer: Federal, State, Local not specified - PPO

## 2015-07-09 VITALS — BP 122/86 | HR 81 | Ht 69.0 in | Wt 232.0 lb

## 2015-07-09 DIAGNOSIS — M79642 Pain in left hand: Secondary | ICD-10-CM

## 2015-07-09 DIAGNOSIS — S66802A Unspecified injury of other specified muscles, fascia and tendons at wrist and hand level, left hand, initial encounter: Secondary | ICD-10-CM

## 2015-07-09 DIAGNOSIS — S6982XA Other specified injuries of left wrist, hand and finger(s), initial encounter: Secondary | ICD-10-CM | POA: Diagnosis not present

## 2015-07-09 MED ORDER — MELOXICAM 15 MG PO TABS: 15.0000 mg | ORAL_TABLET | Freq: Every day | ORAL | Status: DC

## 2015-07-09 NOTE — Patient Instructions (Signed)
Great to see you Mary Sanford is your friend Meloxicam daily as needed Copy will be calling you COntinue the brace Happy holidays!

## 2015-07-09 NOTE — Assessment & Plan Note (Signed)
Patient's ultrasound again today and is consistent with true tear of the UCL. We discussed advance imaging the patient declined with referral to hand surgeon for further evaluation. I do believe the patient will likely need surgical intervention. Cannot tell though how acutely this injury did occur. This is the reason why advance imaging may be warranted in the long run. Patient has been referred to a hand specialist. We discussed continuing the brace until patient is further evaluated. Discussed different treatment options such as formal physical therapy which would patient's weakness I do not think is a good idea. We discussed long-term prognosis as well as greater depth.  Spent  25 minutes with patient face-to-face and had greater than 50% of counseling including as described above in assessment and plan.

## 2015-07-09 NOTE — Progress Notes (Signed)
Pre visit review using our clinic review tool, if applicable. No additional management support is needed unless otherwise documented below in the visit note. 

## 2015-07-09 NOTE — Progress Notes (Signed)
[facility] [place], [place] [address] Phone: [phone] Subjective:    I'm seeing this patient by the request  of:  Scarlette Calico, MD   CC: left wrist pain  [id number] Mary Sanford is a 49 y.o. female coming in with complaint of left wrist pain. Seems to be more of a long-standing process but did have a UCL injury. Significant laxity of the UCL was noted as well as a de Quervain's tenosynovitis. Patient was given an injection for the de Quervain's which was somewhat helpful. Patient was to continue to wear brace. She has been doing this fairly regularly. Notices when she does not she has some weakness of this hand. States that if she tries to grasp anything she has pain as well as still in the same area of the thumb. Would state that she has minimal benefit overall.  Past Medical History  Diagnosis Date  . Anemia   . Hypertension   . Kidney stones   . B12 deficiency   . Depression     no meds  . Cough     non- productive  . Anxiety    Past Surgical History  Procedure Laterality Date  . Caesarean section    . Cystoscopic      extraction of ureteric calculus without disintegration  . Hemorrhoidectomy with hemorrhoid banding  10/19/2012    SCA THD Hem ligation/pexy  . Tubal ligation    . Wisdom tooth extraction    . Dilitation & currettage/hystroscopy with novasure ablation N/A 06/17/2013    Procedure: DILATATION & CURETTAGE/HYSTEROSCOPY WITH NOVASURE ABLATION;  Surgeon: Osborne Oman, MD;  Location: [facility];  Service: Gynecology;  Laterality: N/A;   Social History  Substance Use Topics  . Smoking status: Former Smoker    Types: Cigarettes    Quit date: 08/05/1999  . Smokeless tobacco: Never Used  . Alcohol Use: Yes   Allergies  Allergen Reactions  . Naproxen     Panic attacks   Family History  Problem Relation Age of Onset  . Asthma    . Hypothyroidism    . Early death Neg Hx   . Heart disease Paternal  Grandfather     >55  . Hyperlipidemia Neg Hx   . Kidney disease Neg Hx   . Stroke Maternal Uncle     > 55  . Hypertension Mother   . Hypothyroidism Mother   . Asthma Mother   . Cancer Mother 62    Colon and rectal  . Hypotension Sister   . Hypothyroidism Sister   . Diabetes Sister   . Seizures Brother   . Hypothyroidism Sister   . Seizures Sister   . Anemia Sister   . Deep vein thrombosis Sister         Past medical history, social, surgical and family history all reviewed in electronic medical record.   Review of Systems: No headache, visual changes, nausea, vomiting, diarrhea, constipation, dizziness, abdominal pain, skin rash, fevers, chills, night sweats, weight loss, swollen lymph nodes, body aches, joint swelling, muscle aches, chest pain, shortness of breath, mood changes.   Objective Blood pressure 122/86, pulse 81, height 5\' 9"  (1.753 m), weight 232 lb (105.235 kg), last menstrual period 06/20/2015, SpO2 98 %.  General: No apparent distress alert and oriented x3 mood and affect normal, dressed appropriately.  HEENT: Pupils equal, extraocular movements intact  Respiratory: Patient's speak in full sentences and does not appear short of breath  Cardiovascular:  No lower extremity edema, non tender, no erythema  Skin: Warm dry intact with no signs of infection or rash on extremities or on axial skeleton.  Abdomen: Soft nontender  Neuro: Cranial nerves II through XII are intact, neurovascularly intact in all extremities with 2+ DTRs and 2+ pulses.  Lymph: No lymphadenopathy of posterior or anterior cervical chain or axillae bilaterally.  Gait normal with good balance and coordination.  MSK:  Non tender with full range of motion and good stability and symmetric strength and tone of shoulders, elbows, hip, knee and ankles bilaterally.  [place] Inspection normal with no visible erythema or decrease swelling from previous exam but still has tenderness over the UCL and  medial aspect of the thenar eminence ROM smooth and normal with good flexion and extension and ulnar/radial deviation that is symmetrical with opposite wrist. Palpation is normal over metacarpals, navicular, lunate, and TFCC; tendons without tenderness/ swelling No snuffbox tenderness. No tenderness over Canal of [place]. Strength 5/5 in all directions without pain. Mild positive Finkelstein's Patient does have instability with significant gapping of the UCL against resistance. Same as previous exam Contralateral wrist unremarkable  MSK US performed of: left wrist This study was ordered, performed, and interpreted by Charlann Boxer D.O.  Wrist: All extensor compartments and swelling within the tendon sheath of the abductor pollicis longus is significantly less than previous exam. TFCC intact. Scapholunate ligament intact. Carpal tunnel visualized and median nerve area normal, flexor tendons all normal in appearance without fraying, tears, or sheath effusions. Power doppler signal normal. Patient does have what appears to be a complete rupture of the UCL with a possible stinger lesion still present with no signs of healing  IMPRESSION:  : Continued UCL tear         Impression and Recommendations:     This case required medical decision making of moderate complexity.

## 2015-07-11 ENCOUNTER — Ambulatory Visit: Payer: Federal, State, Local not specified - PPO | Admitting: Family Medicine

## 2015-07-18 ENCOUNTER — Ambulatory Visit: Payer: Federal, State, Local not specified - PPO

## 2015-07-19 ENCOUNTER — Ambulatory Visit (INDEPENDENT_AMBULATORY_CARE_PROVIDER_SITE_OTHER): Payer: Federal, State, Local not specified - PPO

## 2015-07-19 DIAGNOSIS — E538 Deficiency of other specified B group vitamins: Secondary | ICD-10-CM | POA: Diagnosis not present

## 2015-07-19 MED ORDER — CYANOCOBALAMIN 1000 MCG/ML IJ SOLN
1000.0000 ug | Freq: Once | INTRAMUSCULAR | Status: AC
  Administered 2015-07-19: 1000 ug via INTRAMUSCULAR

## 2015-08-09 ENCOUNTER — Encounter: Payer: Self-pay | Admitting: Internal Medicine

## 2015-08-09 ENCOUNTER — Ambulatory Visit (INDEPENDENT_AMBULATORY_CARE_PROVIDER_SITE_OTHER): Payer: Federal, State, Local not specified - PPO | Admitting: Internal Medicine

## 2015-08-09 VITALS — BP 112/80 | HR 80 | Temp 97.8°F | Resp 16 | Ht 69.0 in | Wt 226.0 lb

## 2015-08-09 DIAGNOSIS — D519 Vitamin B12 deficiency anemia, unspecified: Secondary | ICD-10-CM

## 2015-08-09 DIAGNOSIS — J209 Acute bronchitis, unspecified: Secondary | ICD-10-CM

## 2015-08-09 MED ORDER — AZITHROMYCIN 500 MG PO TABS: 500.0000 mg | ORAL_TABLET | Freq: Every day | ORAL | Status: DC

## 2015-08-09 MED ORDER — CYANOCOBALAMIN 1000 MCG/ML IJ SOLN
1000.0000 ug | Freq: Once | INTRAMUSCULAR | Status: AC
  Administered 2015-08-09: 1000 ug via INTRAMUSCULAR

## 2015-08-09 MED ORDER — HYDROCOD POLST-CPM POLST ER 10-8 MG/5ML PO SUER: 5.0000 mL | Freq: Two times a day (BID) | ORAL | Status: DC | PRN

## 2015-08-09 NOTE — Progress Notes (Signed)
Pre visit review using our clinic review tool, if applicable. No additional management support is needed unless otherwise documented below in the visit note. 

## 2015-08-09 NOTE — Progress Notes (Signed)
Subjective:  Patient ID: Mary Sanford, female    DOB: 1966-03-04  Age: 50 y.o. MRN: [id number]  CC: Cough    HPI Mary Sanford presents for a 3 day history of cough productive of yellow phlegm with sore throat and chills.  Outpatient Prescriptions Prior to Visit  Medication Sig Dispense Refill  . albuterol (PROVENTIL HFA;VENTOLIN HFA) 108 (90 BASE) MCG/ACT inhaler Inhale 2 puffs into the lungs every 4 (four) hours as needed for wheezing or shortness of breath. 1 Inhaler 0  . CONTRAVE 8-90 MG TB12 Take 2 tablets by mouth daily.    . cyanocobalamin (,VITAMIN B-12,) 1000 MCG/ML injection Inject 1,000 mcg into the muscle every 14 (fourteen) days.    . hydrochlorothiazide (HYDRODIURIL) 25 MG tablet TAKE ONE TABLET BY MOUTH ONCE DAILY 90 tablet 1  . ibuprofen (ADVIL,MOTRIN) 200 MG tablet Take 600 mg by mouth every 6 (six) hours as needed for headache or moderate pain.     Marland Kitchen KLOR-CON M10 10 MEQ tablet TAKE ONE TABLET BY MOUTH TWICE DAILY 180 tablet 1  . meloxicam (MOBIC) 15 MG tablet Take 1 tablet (15 mg total) by mouth daily. 30 tablet 0   Facility-Administered Medications Prior to Visit  Medication Dose Route Frequency Provider Last Rate Last Dose  . cyanocobalamin ((VITAMIN B-12)) injection 1,000 mcg  1,000 mcg Intramuscular Q14 Days [name], MD   1,000 mcg at 12/05/13 1651    ROS Review of Systems  Constitutional: Positive for chills. Negative for fever, activity change, appetite change, fatigue and unexpected weight change.  HENT: Positive for sore throat. Negative for congestion, trouble swallowing and voice change.   Eyes: Negative.   Respiratory: Positive for cough. Negative for choking, chest tightness, shortness of breath, wheezing and stridor.   Cardiovascular: Negative.  Negative for chest pain, palpitations and leg swelling.  Gastrointestinal: Negative.  Negative for nausea, vomiting, abdominal pain, diarrhea and constipation.  Endocrine: Negative.     Genitourinary: Negative.   Musculoskeletal: Negative.  Negative for myalgias, back pain, joint swelling and arthralgias.  Skin: Negative.  Negative for color change and rash.  Allergic/Immunologic: Negative.   Neurological: Negative.  Negative for dizziness, weakness and light-headedness.  Hematological: Negative.  Negative for adenopathy. Does not bruise/bleed easily.  Psychiatric/Behavioral: Negative.     Objective:  BP 112/80 mmHg  Pulse 80  Temp(Src) 97.8 F (36.6 C) (Oral)  Resp 16  Ht 5\' 9"  (1.753 m)  Wt 226 lb (102.513 kg)  BMI 33.36 kg/m2  SpO2 97%  BP Readings from Last 3 Encounters:  08/09/15 112/80  07/09/15 122/86  07/05/15 124/75    Wt Readings from Last 3 Encounters:  08/09/15 226 lb (102.513 kg)  07/09/15 232 lb (105.235 kg)  07/04/15 233 lb (105.688 kg)    Physical Exam  Constitutional: She is oriented to person, place, and time.  Non-toxic appearance. She does not have a sickly appearance. She does not appear ill. No distress.  HENT:  Mouth/Throat: Mucous membranes are normal. Mucous membranes are not pale, not dry and not cyanotic. No oral lesions. No trismus in the jaw. No uvula swelling. Posterior oropharyngeal erythema present. No oropharyngeal exudate, posterior oropharyngeal edema or tonsillar abscesses.  Eyes: Conjunctivae are normal. Right eye exhibits no discharge. Left eye exhibits no discharge. No scleral icterus.  Neck: Normal range of motion. Neck supple. No JVD present. No tracheal deviation present. No thyromegaly present.  Cardiovascular: Normal rate, regular rhythm, normal heart sounds and intact distal pulses.  Exam  reveals no gallop and no friction rub.   No murmur heard. Pulmonary/Chest: Effort normal and breath sounds normal. No stridor. No respiratory distress. She has no wheezes. She has no rales. She exhibits no tenderness.  Abdominal: Soft. Bowel sounds are normal. She exhibits no distension and no mass. There is no tenderness.  There is no rebound and no guarding.  Musculoskeletal: Normal range of motion. She exhibits no edema or tenderness.  Lymphadenopathy:    She has no cervical adenopathy.  Neurological: She is oriented to person, place, and time.  Skin: Skin is warm and dry. No rash noted. She is not diaphoretic. No erythema. No pallor.  Psychiatric: She has a normal mood and affect. Her behavior is normal. Judgment and thought content normal.  Vitals reviewed.   Lab Results  Component Value Date   WBC 6.1 07/04/2015   HGB 13.4 07/04/2015   HCT 42.1 07/04/2015   PLT 249 07/04/2015   GLUCOSE 110* 07/04/2015   CHOL 141 04/17/2015   TRIG 177.0* 04/17/2015   HDL 45.70 04/17/2015   LDLCALC 59 04/17/2015   ALT 32 11/02/2014   AST 21 11/02/2014   NA 139 07/04/2015   K 3.4* 07/04/2015   CL 103 07/04/2015   CREATININE 0.91 07/04/2015   BUN 11 07/04/2015   CO2 30 07/04/2015   TSH 1.05 12/05/2013   HGBA1C 6.1 04/17/2015    No results found.  Assessment & Plan:   Mary Sanford was seen today for cough.  Diagnoses and all orders for this visit:  Acute bronchitis, unspecified organism -     chlorpheniramine-HYDROcodone (TUSSIONEX PENNKINETIC ER) 10-8 MG/5ML SUER; Take 5 mLs by mouth every 12 (twelve) hours as needed for cough. -     azithromycin (ZITHROMAX) 500 MG tablet; Take 1 tablet (500 mg total) by mouth daily.  B12 deficiency anemia -     cyanocobalamin ((VITAMIN B-12)) injection 1,000 mcg; Inject 1 mL (1,000 mcg total) into the muscle once.  I am having Mary Sanford start on chlorpheniramine-HYDROcodone and azithromycin. I am also having her maintain her albuterol, ibuprofen, CONTRAVE, cyanocobalamin, KLOR-CON M10, hydrochlorothiazide, and meloxicam. We administered cyanocobalamin. We will continue to administer cyanocobalamin.  Meds ordered this encounter  Medications  . chlorpheniramine-HYDROcodone (TUSSIONEX PENNKINETIC ER) 10-8 MG/5ML SUER    Sig: Take 5 mLs by mouth every 12 (twelve) hours as  needed for cough.    Dispense:  140 mL    Refill:  0  . azithromycin (ZITHROMAX) 500 MG tablet    Sig: Take 1 tablet (500 mg total) by mouth daily.    Dispense:  3 tablet    Refill:  0  . cyanocobalamin ((VITAMIN B-12)) injection 1,000 mcg    Sig:      Follow-up: Return in about 3 weeks (around 08/30/2015).  Scarlette Calico, MD

## 2015-08-09 NOTE — Patient Instructions (Signed)

## 2015-09-06 ENCOUNTER — Ambulatory Visit (INDEPENDENT_AMBULATORY_CARE_PROVIDER_SITE_OTHER): Payer: Federal, State, Local not specified - PPO | Admitting: General Practice

## 2015-09-06 DIAGNOSIS — E539 Vitamin B deficiency, unspecified: Secondary | ICD-10-CM | POA: Diagnosis not present

## 2015-09-06 MED ORDER — CYANOCOBALAMIN 1000 MCG/ML IJ SOLN
1000.0000 ug | Freq: Once | INTRAMUSCULAR | Status: AC
  Administered 2015-09-06: 1000 ug via INTRAMUSCULAR

## 2015-09-28 ENCOUNTER — Ambulatory Visit: Payer: Federal, State, Local not specified - PPO

## 2015-10-05 ENCOUNTER — Encounter: Payer: Self-pay | Admitting: Internal Medicine

## 2015-10-05 ENCOUNTER — Other Ambulatory Visit: Payer: Self-pay

## 2015-10-05 DIAGNOSIS — Z1231 Encounter for screening mammogram for malignant neoplasm of breast: Secondary | ICD-10-CM

## 2015-10-15 ENCOUNTER — Ambulatory Visit
Admission: RE | Admit: 2015-10-15 | Discharge: 2015-10-15 | Disposition: A | Discharge status: Home / Self Care | Payer: Federal, State, Local not specified - PPO | Source: Ambulatory Visit

## 2015-10-15 DIAGNOSIS — Z1231 Encounter for screening mammogram for malignant neoplasm of breast: Secondary | ICD-10-CM

## 2015-10-16 LAB — HM MAMMOGRAPHY

## 2015-10-22 ENCOUNTER — Ambulatory Visit (INDEPENDENT_AMBULATORY_CARE_PROVIDER_SITE_OTHER): Payer: Federal, State, Local not specified - PPO

## 2015-10-22 DIAGNOSIS — E538 Deficiency of other specified B group vitamins: Secondary | ICD-10-CM | POA: Diagnosis not present

## 2015-10-22 MED ORDER — CYANOCOBALAMIN 1000 MCG/ML IJ SOLN
1000.0000 ug | Freq: Once | INTRAMUSCULAR | Status: AC
  Administered 2015-10-22: 1000 ug via INTRAMUSCULAR

## 2015-11-13 ENCOUNTER — Ambulatory Visit (INDEPENDENT_AMBULATORY_CARE_PROVIDER_SITE_OTHER): Payer: Federal, State, Local not specified - PPO

## 2015-11-13 DIAGNOSIS — E538 Deficiency of other specified B group vitamins: Secondary | ICD-10-CM | POA: Diagnosis not present

## 2015-11-13 MED ORDER — CYANOCOBALAMIN 1000 MCG/ML IJ SOLN
1000.0000 ug | Freq: Once | INTRAMUSCULAR | Status: AC
  Administered 2015-11-13: 1000 ug via INTRAMUSCULAR

## 2015-11-27 ENCOUNTER — Ambulatory Visit (INDEPENDENT_AMBULATORY_CARE_PROVIDER_SITE_OTHER): Payer: Federal, State, Local not specified - PPO

## 2015-11-27 DIAGNOSIS — D519 Vitamin B12 deficiency anemia, unspecified: Secondary | ICD-10-CM

## 2015-11-27 MED ORDER — CYANOCOBALAMIN 1000 MCG/ML IJ SOLN
1000.0000 ug | Freq: Once | INTRAMUSCULAR | Status: AC
  Administered 2015-11-27: 1000 ug via INTRAMUSCULAR

## 2015-12-12 NOTE — Progress Notes (Signed)
This encounter was created in error - please disregard.

## 2015-12-13 ENCOUNTER — Encounter: Payer: Federal, State, Local not specified - PPO | Admitting: Cardiovascular Disease

## 2015-12-18 ENCOUNTER — Ambulatory Visit (INDEPENDENT_AMBULATORY_CARE_PROVIDER_SITE_OTHER): Payer: Federal, State, Local not specified - PPO

## 2015-12-18 DIAGNOSIS — D519 Vitamin B12 deficiency anemia, unspecified: Secondary | ICD-10-CM | POA: Diagnosis not present

## 2015-12-18 MED ORDER — CYANOCOBALAMIN 1000 MCG/ML IJ SOLN
1000.0000 ug | Freq: Once | INTRAMUSCULAR | Status: AC
  Administered 2015-12-18: 1000 ug via INTRAMUSCULAR

## 2015-12-26 ENCOUNTER — Ambulatory Visit: Payer: Federal, State, Local not specified - PPO | Admitting: Internal Medicine

## 2015-12-28 ENCOUNTER — Other Ambulatory Visit (INDEPENDENT_AMBULATORY_CARE_PROVIDER_SITE_OTHER): Payer: Federal, State, Local not specified - PPO

## 2015-12-28 ENCOUNTER — Encounter: Payer: Self-pay | Admitting: Internal Medicine

## 2015-12-28 ENCOUNTER — Ambulatory Visit (INDEPENDENT_AMBULATORY_CARE_PROVIDER_SITE_OTHER): Payer: Federal, State, Local not specified - PPO | Admitting: Internal Medicine

## 2015-12-28 VITALS — BP 130/72 | HR 70 | Temp 98.6°F | Resp 20 | Wt 236.0 lb

## 2015-12-28 DIAGNOSIS — R232 Flushing: Secondary | ICD-10-CM

## 2015-12-28 DIAGNOSIS — I10 Essential (primary) hypertension: Secondary | ICD-10-CM

## 2015-12-28 DIAGNOSIS — N951 Menopausal and female climacteric states: Secondary | ICD-10-CM

## 2015-12-28 DIAGNOSIS — F419 Anxiety disorder, unspecified: Secondary | ICD-10-CM

## 2015-12-28 DIAGNOSIS — F418 Other specified anxiety disorders: Secondary | ICD-10-CM | POA: Insufficient documentation

## 2015-12-28 DIAGNOSIS — F329 Major depressive disorder, single episode, unspecified: Secondary | ICD-10-CM

## 2015-12-28 DIAGNOSIS — D519 Vitamin B12 deficiency anemia, unspecified: Secondary | ICD-10-CM

## 2015-12-28 DIAGNOSIS — F32A Depression, unspecified: Secondary | ICD-10-CM

## 2015-12-28 DIAGNOSIS — R7303 Prediabetes: Secondary | ICD-10-CM | POA: Diagnosis not present

## 2015-12-28 LAB — CBC WITH DIFFERENTIAL/PLATELET
BASOS PCT: 0.8 % (ref 0.0–3.0)
Basophils Absolute: 0.1 10*3/uL (ref 0.0–0.1)
EOS ABS: 0.2 10*3/uL (ref 0.0–0.7)
EOS PCT: 3.9 % (ref 0.0–5.0)
HEMATOCRIT: 42.3 % (ref 36.0–46.0)
Hemoglobin: 13.5 g/dL (ref 12.0–15.0)
LYMPHS PCT: 39.4 % (ref 12.0–46.0)
Lymphs Abs: 2.5 10*3/uL (ref 0.7–4.0)
MCHC: 31.8 g/dL (ref 30.0–36.0)
MCV: 72.6 fl — AB (ref 78.0–100.0)
Monocytes Absolute: 0.8 10*3/uL (ref 0.1–1.0)
Monocytes Relative: 12.7 % — ABNORMAL HIGH (ref 3.0–12.0)
NEUTROS ABS: 2.7 10*3/uL (ref 1.4–7.7)
Neutrophils Relative %: 43.2 % (ref 43.0–77.0)
PLATELETS: 272 10*3/uL (ref 150.0–400.0)
RBC: 5.82 Mil/uL — ABNORMAL HIGH (ref 3.87–5.11)
RDW: 14.1 % (ref 11.5–15.5)
WBC: 6.3 10*3/uL (ref 4.0–10.5)

## 2015-12-28 LAB — BASIC METABOLIC PANEL
BUN: 13 mg/dL (ref 6–23)
CHLORIDE: 104 meq/L (ref 96–112)
CO2: 30 meq/L (ref 19–32)
Calcium: 9.6 mg/dL (ref 8.4–10.5)
Creatinine, Ser: 0.76 mg/dL (ref 0.40–1.20)
GFR: 104.18 mL/min (ref >60.00)
GLUCOSE: 96 mg/dL (ref 70–99)
POTASSIUM: 3.9 meq/L (ref 3.5–5.1)
SODIUM: 138 meq/L (ref 135–145)

## 2015-12-28 LAB — HEPATIC FUNCTION PANEL
ALT: 22 U/L (ref 0–35)
AST: 16 U/L (ref 0–37)
Albumin: 3.8 g/dL (ref 3.5–5.2)
Alkaline Phosphatase: 51 U/L (ref 39–117)
Bilirubin, Direct: 0.1 mg/dL (ref 0.0–0.3)
Total Bilirubin: 0.3 mg/dL (ref 0.2–1.2)
Total Protein: 6.5 g/dL (ref 6.0–8.3)

## 2015-12-28 LAB — FOLLICLE STIMULATING HORMONE: FSH: 5.6 m[IU]/mL

## 2015-12-28 LAB — TSH: TSH: 0.85 u[IU]/mL (ref 0.35–4.50)

## 2015-12-28 LAB — HEMOGLOBIN A1C: Hgb A1c MFr Bld: 6.1 % (ref 4.6–6.5)

## 2015-12-28 MED ORDER — ESCITALOPRAM OXALATE 10 MG PO TABS: 10.0000 mg | ORAL_TABLET | Freq: Every day | ORAL | Status: DC

## 2015-12-28 NOTE — Patient Instructions (Signed)
Please take all new medication as prescribed - the lexapro 10 mg per day as this can help nerves, depression and menopausal symptoms  Please continue all other medications as before, and refills have been done if requested.  Please have the pharmacy call with any other refills you may need.  Please keep your appointments with your specialists as you may have planned  Please go to the LAB in the Basement (turn left off the elevator) for the tests to be done today  You will be contacted by phone if any changes need to be made immediately.  Otherwise, you will receive a letter about your results with an explanation, but please check with MyChart first.  Please remember to sign up for MyChart if you have not done so, as this will be important to you in the future with finding out test results, communicating by private email, and scheduling acute appointments online when needed.

## 2015-12-28 NOTE — Progress Notes (Signed)
Subjective:    Patient ID: Mary Sanford, female    DOB: Nov 21, 1965, 50 y.o.   MRN: [id number]  HPI  Here with concerns, thinks possibly menopause due to several months worsening moderate moodiness, wt gain, off balance, fatigue (without daytime somnolence), hot flashes and painful intercourse. S/p endometrial ablation a few yrs ago, now with light but regular menses.   Denies hyper or hypo thyroid symptoms such as voice, skin or hair change. Mother with hx of thyroid dz.  Pt denies chest pain, increased sob or doe, wheezing, orthopnea, PND, increased LE swelling, palpitations, dizziness or syncope.  Pt denies new neurological symptoms such as new headache, or facial or extremity weakness or numbness.  Pt denies polydipsia, polyuria.  Was taking contrave for wt loss, but became wary of possible side effect and stopped. Has had mild worsening depressive symptoms, but no suicidal ideation, has had very mild panic but much better than years past; has ongoing anxiety.  Asks for b12 shot today as she is due Past Medical History  Diagnosis Date  . Anemia   . Hypertension   . Kidney stones   . B12 deficiency   . Depression     no meds  . Cough     non- productive  . Anxiety    Past Surgical History  Procedure Laterality Date  . Caesarean section    . Cystoscopic      extraction of ureteric calculus without disintegration  . Hemorrhoidectomy with hemorrhoid banding  10/19/2012    SCA THD Hem ligation/pexy  . Tubal ligation    . Wisdom tooth extraction    . Dilitation & currettage/hystroscopy with novasure ablation N/A 06/17/2013    Procedure: DILATATION & CURETTAGE/HYSTEROSCOPY WITH NOVASURE ABLATION;  Surgeon: Osborne Oman, MD;  Location: [facility];  Service: Gynecology;  Laterality: N/A;    reports that she quit smoking about 16 years ago. Her smoking use included Cigarettes. She has never used smokeless tobacco. She reports that she drinks alcohol. She reports that she does not use  illicit drugs. family history includes Anemia in her sister; Asthma in her mother; Cancer (age of onset: 54) in her mother; Deep vein thrombosis in her sister; Diabetes in her sister; Heart disease in her paternal grandfather; Hypertension in her mother; Hypotension in her sister; Hypothyroidism in her mother, sister, and sister; Seizures in her brother and sister; Stroke in her maternal uncle. There is no history of Early death, Hyperlipidemia, or Kidney disease. Allergies  Allergen Reactions  . Naproxen     Panic attacks   \ Current Outpatient Prescriptions on File Prior to Visit  Medication Sig Dispense Refill  . cyanocobalamin (,VITAMIN B-12,) 1000 MCG/ML injection Inject 1,000 mcg into the muscle every 14 (fourteen) days.    . hydrochlorothiazide (HYDRODIURIL) 25 MG tablet TAKE ONE TABLET BY MOUTH ONCE DAILY 90 tablet 1  . ibuprofen (ADVIL,MOTRIN) 200 MG tablet Take 600 mg by mouth every 6 (six) hours as needed for headache or moderate pain.     Marland Kitchen KLOR-CON M10 10 MEQ tablet TAKE ONE TABLET BY MOUTH TWICE DAILY 180 tablet 1  . [DISCONTINUED] potassium chloride (K-DUR) 10 MEQ tablet Take 1 tablet (10 mEq total) by mouth 2 (two) times daily. 60 tablet 11   Current Facility-Administered Medications on File Prior to Visit  Medication Dose Route Frequency Provider Last Rate Last Dose  . cyanocobalamin ((VITAMIN B-12)) injection 1,000 mcg  1,000 mcg Intramuscular Q14 Days [name], MD   1,000  mcg at 12/05/13 1651   Review of Systems  Constitutional: Negative for unusual diaphoresis or night sweats HENT: Negative for ear swelling or discharge Eyes: Negative for worsening visual haziness  Respiratory: Negative for choking and stridor.   Gastrointestinal: Negative for distension or worsening eructation Genitourinary: Negative for retention or change in urine volume.  Musculoskeletal: Negative for other MSK pain or swelling Skin: Negative for color change and worsening  wound Neurological: Negative for tremors and numbness other than noted  Psychiatric/Behavioral: Negative for decreased concentration or agitation other than above       Objective:   Physical Exam BP 130/72 mmHg  Pulse 70  Temp(Src) 98.6 F (37 C) (Oral)  Resp 20  Wt 236 lb (107.049 kg)  SpO2 98% VS noted,  Constitutional: Pt appears in no apparent distress HENT: Head: NCAT.  Right Ear: External ear normal.  Left Ear: External ear normal.  Eyes: . Pupils are equal, round, and reactive to light. Conjunctivae and EOM are normal Neck: Normal range of motion. Neck supple.  Cardiovascular: Normal rate and regular rhythm.   Pulmonary/Chest: Effort normal and breath sounds without rales or wheezing.  Abd:  Soft, NT, ND, + BS Neurological: Pt is alert. Not confused , motor grossly intact Skin: Skin is warm. No rash, no LE edema Psychiatric: Pt behavior is normal. No agitation. 1-2+ nervous     Assessment & Plan:

## 2015-12-28 NOTE — Progress Notes (Signed)
Pre visit review using our clinic review tool, if applicable. No additional management support is needed unless otherwise documented below in the visit note. 

## 2015-12-28 NOTE — Assessment & Plan Note (Addendum)
Also for b12 IM as she is due

## 2015-12-29 NOTE — Assessment & Plan Note (Signed)
stable overall by history and exam, recent data reviewed with pt, and pt to continue medical treatment as before,  to f/u any worsening symptoms or concerns BP Readings from Last 3 Encounters:  12/28/15 130/72  08/09/15 112/80  07/09/15 122/86

## 2015-12-29 NOTE — Assessment & Plan Note (Signed)
Mild, asympt, stable overall by history and exam, recent data reviewed with pt, and pt to continue medical treatment as before,  to f/u any worsening symptoms or concerns, for f/u a1c Lab Results  Component Value Date   HGBA1C 6.1 12/28/2015

## 2015-12-29 NOTE — Assessment & Plan Note (Signed)
Possibly an important underlying cause for most of her concerns today, declines counseling, for lexapro 10 qd, .dolloqj

## 2015-12-29 NOTE — Assessment & Plan Note (Addendum)
Having regular menses, very unlikely menopause but possible pre-menopause, for [facility] today, also lexapro likely to help with symptoms  Note:  Total time for pt hx, exam, review of record with pt in the room, determination of diagnoses and plan for further eval and tx is > 40 min, with over 50% spent in coordination and counseling of patient

## 2016-01-04 ENCOUNTER — Other Ambulatory Visit: Payer: Self-pay | Admitting: Internal Medicine

## 2016-01-04 ENCOUNTER — Encounter: Payer: Self-pay | Admitting: Cardiovascular Disease

## 2016-01-04 ENCOUNTER — Ambulatory Visit (INDEPENDENT_AMBULATORY_CARE_PROVIDER_SITE_OTHER): Payer: Federal, State, Local not specified - PPO | Admitting: Cardiovascular Disease

## 2016-01-04 VITALS — BP 108/82 | HR 75 | Ht 69.0 in | Wt 235.0 lb

## 2016-01-04 DIAGNOSIS — R002 Palpitations: Secondary | ICD-10-CM

## 2016-01-04 DIAGNOSIS — I10 Essential (primary) hypertension: Secondary | ICD-10-CM

## 2016-01-04 DIAGNOSIS — E669 Obesity, unspecified: Secondary | ICD-10-CM | POA: Diagnosis not present

## 2016-01-04 DIAGNOSIS — R42 Dizziness and giddiness: Secondary | ICD-10-CM

## 2016-01-04 NOTE — Patient Instructions (Signed)
Medication Instructions:  Your physician recommends that you continue on your current medications as directed. Please refer to the Current Medication list given to you today.  Labwork: none  Testing/Procedures: none  Follow-Up: As needed   If you need a refill on your cardiac medications before your next appointment, please call your pharmacy.  

## 2016-01-04 NOTE — Progress Notes (Signed)
Cardiology Office Note   Date:  01/04/2016   ID:  [facility], DOB 04/02/1966, MRN [id number]  PCP:  Mary Calico, MD  Cardiologist:   Mary Latch, MD   Chief Complaint  Patient presents with  . New Evaluation    Referred by PCP--Dr. Ronnald [facility] to establish care  pt c/o occasional random dizziness, occasionally feels a flutter in her chest; no other Sx.     History of Present Illness: [facility] is a 50 y.o. female with hypertension, who presents for an evaluation of dizziness and palpitations. [facility] requested a referral from her PCP to assess her risk of cardiovascular disease. She knows that she is overweight and is concerned because she has central adiposity. She was told that this can increase her risk of cardiovascular disease.  She has not noted any chest pain or shortness of breath. She notes very occasional heart fluttering that occurs sporadically. It lasts for a few seconds at a time and causes her to have to take a deep breath. It does not occur with exertion and is not associated with any chest discomfort. She also denies any associated lightheadedness or dizziness. The only time she notes lightheadedness is when she goes up and down an elevator. She has not noted any lower extremity edema, orthopnea, or PND. She endorses general fatigue. [facility] does not exercise regularly. She also struggles with her weight and endorses having a poor diet.  She was started on Contrave (naltrexone and buproprion) for weight loss.  This was helping her blood sugar and making her lose weight. However she stopped it because she was concerned about taking this medication long-term.  She also reports feeling stressed and overwhelmed.    [facility] quit smoking 17 years ago  Past Medical History  Diagnosis Date  . Anemia   . Hypertension   . Kidney stones   . B12 deficiency   . Depression     no meds  . Cough     non- productive  . Anxiety     Past Surgical History    Procedure Laterality Date  . Caesarean section    . Cystoscopic      extraction of ureteric calculus without disintegration  . Hemorrhoidectomy with hemorrhoid banding  10/19/2012    SCA THD Hem ligation/pexy  . Tubal ligation    . Wisdom tooth extraction    . Dilitation & currettage/hystroscopy with novasure ablation N/A 06/17/2013    Procedure: DILATATION & CURETTAGE/HYSTEROSCOPY WITH NOVASURE ABLATION;  Surgeon: Mary Oman, MD;  [facility]: [facility];  Service: Gynecology;  Laterality: N/A;     Current Outpatient Prescriptions  Medication Sig Dispense Refill  . cyanocobalamin (,VITAMIN B-12,) 1000 MCG/ML injection Inject 1,000 mcg into the muscle every 14 (fourteen) days.    Marland Kitchen escitalopram (LEXAPRO) 10 MG tablet Take 1 tablet (10 mg total) by mouth daily. 90 tablet 3  . hydrochlorothiazide (HYDRODIURIL) 25 MG tablet TAKE ONE TABLET BY MOUTH ONCE DAILY 90 tablet 1  . ibuprofen (ADVIL,MOTRIN) 200 MG tablet Take 600 mg by mouth every 6 (six) hours as needed for headache or moderate pain.     Marland Kitchen KLOR-CON M10 10 MEQ tablet TAKE ONE TABLET BY MOUTH TWICE DAILY 180 tablet 1  . [DISCONTINUED] potassium chloride (K-DUR) 10 MEQ tablet Take 1 tablet (10 mEq total) by mouth 2 (two) times daily. 60 tablet 11   Current Facility-Administered Medications  Medication Dose Route Frequency Provider Last Rate Last Dose  .  cyanocobalamin ((VITAMIN B-12)) injection 1,000 mcg  1,000 mcg Intramuscular Q14 Days Mary Lima, MD   1,000 mcg at 12/05/13 1651    Allergies:   Naproxen    Social History:  The patient  reports that she quit smoking about 16 years ago. Her smoking use included Cigarettes. She has never used smokeless tobacco. She reports that she drinks alcohol. She reports that she does not use illicit drugs.   Family History:  The patient's family history includes Anemia in her sister; Asthma in her mother; Cancer (age of onset: 27) in her mother; Deep vein thrombosis in her sister; Diabetes  in her sister; Heart disease in her paternal grandfather; Hypertension in her mother; Hypotension in her sister; Hypothyroidism in her mother, sister, and sister; Seizures in her brother and sister; Stroke in her maternal uncle. There is no history of Early death, Hyperlipidemia, or Kidney disease.    ROS:  Please see the history of present illness.   Otherwise, review of systems are positive for none.   All other systems are reviewed and negative.    PHYSICAL EXAM: VS:  BP 108/82 mmHg  Pulse 75  Ht 5\' 9"  (1.753 m)  Wt 235 lb (106.595 kg)  BMI 34.69 kg/m2 , BMI Body mass index is 34.69 kg/(m^2). GENERAL:  Well appearing HEENT:  Pupils equal round and reactive, fundi not visualized, oral mucosa unremarkable NECK:  No jugular venous distention, waveform within normal limits, carotid upstroke brisk and symmetric, no bruits, no thyromegaly LYMPHATICS:  No cervical adenopathy LUNGS:  Clear to auscultation bilaterally HEART:  RRR.  PMI not displaced or sustained,S1 and S2 within normal limits, no S3, no S4, no clicks, no rubs, no murmurs ABD:  Flat, positive bowel sounds normal in frequency in pitch, no bruits, no rebound, no guarding, no midline pulsatile mass, no hepatomegaly, no splenomegaly EXT:  2 plus pulses throughout, no edema, no cyanosis no clubbing SKIN:  No rashes no nodules NEURO:  Cranial nerves II through XII grossly intact, motor grossly intact throughout PSYCH:  Cognitively intact, oriented to person place and time    EKG:  EKG is ordered today. The ekg ordered today demonstrates sinus rhythm.  Rate 75 bpm.     Recent Labs: 12/28/2015: ALT 22; BUN 13; Creatinine, Ser 0.76; Hemoglobin 13.5; Platelets 272.0; Potassium 3.9; Sodium 138; TSH 0.85    Lipid Panel    Component Value Date/Time   CHOL 141 04/17/2015 1404   TRIG 177.0* 04/17/2015 1404   HDL 45.70 04/17/2015 1404   CHOLHDL 3 04/17/2015 1404   VLDL 35.4 04/17/2015 1404   LDLCALC 59 04/17/2015 1404      Wt  Readings from Last 3 Encounters:  01/04/16 235 lb (106.595 kg)  12/28/15 236 lb (107.049 kg)  08/09/15 226 lb (102.513 kg)      ASSESSMENT AND PLAN:  # Palpitations: Symptoms are very infrequent and not concerning for a dangerous arrhythmia.  She had a BMP, CBC and thyroid function that were all within normal limits.  We will not repeat at this time.   # Dizziness: Symptoms only occur on an elevator and do not require further investigation.   # Hypertension: BP well-controlled on HCTZ.  # Obesity: We discussed the importance of regular exercise.  She should be exercising at least 30 minutes most days of the week.  We also discussed the importance of improving her diet.  I don't think she needs a cardiologist at this time.  However, if she doesn't make  some lifestyle changes now, she will in the future.    Current medicines are reviewed at length with the patient today.  The patient does not have concerns regarding medicines.  The following changes have been made:  no change  Labs/ tests ordered today include:  No orders of the defined types were placed in this encounter.     Disposition:   FU with Seira Cody C. Oval Linsey, MD, [facility] as needed.    This note was written with the assistance of speech recognition software.  Please excuse any transcriptional errors.  Signed, Tequilla Cousineau C. Oval Linsey, MD, [facility]  01/04/2016 1:14 PM    [facility]

## 2016-01-10 ENCOUNTER — Telehealth: Payer: Self-pay | Admitting: Internal Medicine

## 2016-01-10 NOTE — Telephone Encounter (Signed)
Patient Name: [facility]  DOB: Feb 24, 1966    Initial Comment caller states she thinks she is having a reaction to medication.    Nurse Assessment  Nurse: Raphael Gibney, RN, Vanita Ingles Date/Time (Eastern Time): 01/10/2016 11:28:58 AM  Confirm and document reason for call. If symptomatic, describe symptoms. You must click the next button to save text entered. ---Caller states she started taking lexapro and started retaking contrace. Lexapro is making her dizzy and very emotional . She is very sleepy. Started lexapro 2 days ago. vomited x 1 yesterday a small amount.  Has the patient traveled out of the country within the last 30 days? ---Not Applicable  Does the patient have any new or worsening symptoms? ---Yes  Will a triage be completed? ---Yes  Related visit to physician within the last 2 weeks? ---Yes  Does the PT have any chronic conditions? (i.e. diabetes, asthma, etc.) ---Yes  List chronic conditions. ---depression; , HTN  Is the patient pregnant or possibly pregnant? (Ask all females between the ages of 15-55) ---No  Is this a behavioral health or substance abuse call? ---No     Guidelines    Guideline Title Affirmed Question Affirmed Notes  Dizziness - Lightheadedness [1] MODERATE dizziness (e.g., interferes with normal activities) AND [2] has NOT been evaluated by physician for this (Exception: dizziness caused by heat exposure, sudden standing, or poor fluid intake)    Final Disposition User   See Physician within 24 Hours [place], RN, Vanita Ingles    Comments  appt scheduled for 01/11/2016 at 11 am with Dr. Walker Kehr.   Referrals  REFERRED TO PCP OFFICE   Disagree/Comply: Comply

## 2016-01-11 ENCOUNTER — Ambulatory Visit: Payer: Self-pay | Admitting: Internal Medicine

## 2016-01-11 DIAGNOSIS — Z0289 Encounter for other administrative examinations: Secondary | ICD-10-CM

## 2016-01-15 ENCOUNTER — Ambulatory Visit (INDEPENDENT_AMBULATORY_CARE_PROVIDER_SITE_OTHER): Payer: Federal, State, Local not specified - PPO | Admitting: Internal Medicine

## 2016-01-15 ENCOUNTER — Encounter: Payer: Self-pay | Admitting: Internal Medicine

## 2016-01-15 VITALS — BP 122/86 | HR 61 | Temp 98.0°F | Resp 16 | Ht 69.0 in | Wt 230.0 lb

## 2016-01-15 DIAGNOSIS — I10 Essential (primary) hypertension: Secondary | ICD-10-CM | POA: Diagnosis not present

## 2016-01-15 DIAGNOSIS — B351 Tinea unguium: Secondary | ICD-10-CM | POA: Diagnosis not present

## 2016-01-15 DIAGNOSIS — D519 Vitamin B12 deficiency anemia, unspecified: Secondary | ICD-10-CM | POA: Diagnosis not present

## 2016-01-15 MED ORDER — TERBINAFINE HCL 250 MG PO TABS: 250.0000 mg | ORAL_TABLET | Freq: Every day | ORAL | Status: DC

## 2016-01-15 MED ORDER — CYANOCOBALAMIN 1000 MCG/ML IJ SOLN
1000.0000 ug | Freq: Once | INTRAMUSCULAR | Status: AC
  Administered 2016-01-15: 1000 ug via INTRAMUSCULAR

## 2016-01-15 NOTE — Progress Notes (Signed)
Subjective:  Patient ID: [facility], female    DOB: 06-Mar-1966  Age: 50 y.o. MRN: [id number]  CC: Hypertension   HPI [facility] presents for a blood pressure check as well as concerns about both great toenails. She complains over the last year that both great toenails have, uncomfortable and they are dark and thick.  She tells me her blood pressure has been well controlled with hydrochlorothiazide. She was feeling well until a couple weeks ago when she was prescribed Lexapro and since starting lexapro she has had a few episodes of dizziness. The dizziness has slowly improved. She denies headache/blurred vision/chest pain/shortness of breath/edema/palpitations/oh fatigue.  Outpatient Prescriptions Prior to Visit  Medication Sig Dispense Refill  . CONTRAVE 8-90 MG TB12 TAKE TWO TABLETS BY MOUTH TWICE DAILY 120 tablet 0  . cyanocobalamin (,VITAMIN B-12,) 1000 MCG/ML injection Inject 1,000 mcg into the muscle every 14 (fourteen) days.    Marland Kitchen escitalopram (LEXAPRO) 10 MG tablet Take 1 tablet (10 mg total) by mouth daily. 90 tablet 3  . hydrochlorothiazide (HYDRODIURIL) 25 MG tablet TAKE ONE TABLET BY MOUTH ONCE DAILY 90 tablet 1  . ibuprofen (ADVIL,MOTRIN) 200 MG tablet Take 600 mg by mouth every 6 (six) hours as needed for headache or moderate pain.     Marland Kitchen KLOR-CON M10 10 MEQ tablet TAKE ONE TABLET BY MOUTH TWICE DAILY 180 tablet 1   Facility-Administered Medications Prior to Visit  Medication Dose Route Frequency Provider Last Rate Last Dose  . cyanocobalamin ((VITAMIN B-12)) injection 1,000 mcg  1,000 mcg Intramuscular Q14 Days [name], MD   1,000 mcg at 12/05/13 1651    ROS Review of Systems  Constitutional: Negative.  Negative for fever, chills, diaphoresis, appetite change and fatigue.  HENT: Negative.   Eyes: Negative.   Respiratory: Negative.  Negative for cough, choking, chest tightness, shortness of breath and stridor.   Cardiovascular: Negative.  Negative for  chest pain, palpitations and leg swelling.  Gastrointestinal: Negative.  Negative for nausea, vomiting, abdominal pain, diarrhea and constipation.  Genitourinary: Negative.   Musculoskeletal: Negative.  Negative for myalgias, back pain, joint swelling and arthralgias.  Skin: Negative.  Negative for color change and rash.  Neurological: Positive for dizziness. Negative for syncope, weakness and light-headedness.  Hematological: Negative.  Negative for adenopathy. Does not bruise/bleed easily.  Psychiatric/Behavioral: Positive for dysphoric mood. Negative for hallucinations, confusion, sleep disturbance, self-injury and decreased concentration. The patient is nervous/anxious. The patient is not hyperactive.     Objective:  BP 122/86 mmHg  Pulse 61  Temp(Src) 98 F (36.7 C) (Oral)  Resp 16  Ht 5\' 9"  (1.753 m)  Wt 230 lb (104.327 kg)  BMI 33.95 kg/m2  SpO2 98%  LMP 01/14/2016  BP Readings from Last 3 Encounters:  01/15/16 122/86  01/04/16 108/82  12/28/15 130/72    Wt Readings from Last 3 Encounters:  01/15/16 230 lb (104.327 kg)  01/04/16 235 lb (106.595 kg)  12/28/15 236 lb (107.049 kg)    Physical Exam  Constitutional: She is oriented to person, place, and time. No distress.  HENT:  Mouth/Throat: Oropharynx is clear and moist. No oropharyngeal exudate.  Eyes: Conjunctivae are normal. Right eye exhibits no discharge. Left eye exhibits no discharge. No scleral icterus.  Neck: Normal range of motion. Neck supple. No JVD present. No tracheal deviation present. No thyromegaly present.  Cardiovascular: Normal rate, regular rhythm, normal heart sounds and intact distal pulses.  Exam reveals no gallop and no friction rub.  No murmur heard. Pulmonary/Chest: Effort normal and breath sounds normal. No stridor. No respiratory distress. She has no wheezes. She has no rales. She exhibits no tenderness.  Abdominal: Soft. Bowel sounds are normal. She exhibits no distension and no mass.  There is no tenderness. There is no rebound and no guarding.  Musculoskeletal: Normal range of motion. She exhibits no edema or tenderness.  Lymphadenopathy:    She has no cervical adenopathy.  Neurological: She is oriented to person, place, and time.  Skin: Skin is warm and dry. No rash noted. She is not diaphoretic. No erythema. No pallor.  Both great toenails show nail thickening with dark subungual debris. 2 other toenails on the left foot are also affected. There is no erythema, exudate, warmth, or fluctuance.  Vitals reviewed.   Lab Results  Component Value Date   WBC 6.3 12/28/2015   HGB 13.5 12/28/2015   HCT 42.3 12/28/2015   PLT 272.0 12/28/2015   GLUCOSE 96 12/28/2015   CHOL 141 04/17/2015   TRIG 177.0* 04/17/2015   HDL 45.70 04/17/2015   LDLCALC 59 04/17/2015   ALT 22 12/28/2015   AST 16 12/28/2015   NA 138 12/28/2015   K 3.9 12/28/2015   CL 104 12/28/2015   CREATININE 0.76 12/28/2015   BUN 13 12/28/2015   CO2 30 12/28/2015   TSH 0.85 12/28/2015   HGBA1C 6.1 12/28/2015    Mm Screening Breast Tomo Bilateral  10/16/2015  CLINICAL DATA:  Screening. EXAM: DIGITAL SCREENING BILATERAL MAMMOGRAM WITH 3D TOMO WITH CAD COMPARISON:  Previous exam(s). ACR Breast Density Category b: There are scattered areas of fibroglandular density. FINDINGS: There are no findings suspicious for malignancy. Images were processed with CAD. IMPRESSION: No mammographic evidence of malignancy. A result letter of this screening mammogram will be mailed directly to the patient. RECOMMENDATION: Screening mammogram in one year. (Code:SM-B-01Y) BI-RADS CATEGORY  1: Negative. Electronically Signed   By: Claudie Revering M.D.   On: 10/16/2015 16:47    Assessment & Plan:   Ramaya was seen today for hypertension.  Diagnoses and all orders for this visit:  Onychomycosis of toenail- her recent liver enzymes were normal, will start terbinafine daily, she will return in 3-4 weeks to recheck the liver enzymes,  she was warned about side effects such as abdominal pain and rash. She will notify me if she develops any new symptoms that are suspicious for side effects. -     terbinafine (LAMISIL) 250 MG tablet; Take 1 tablet (250 mg total) by mouth daily.  B12 deficiency anemia -     cyanocobalamin ((VITAMIN B-12)) injection 1,000 mcg; Inject 1 mL (1,000 mcg total) into the muscle once.  Essential hypertension, benign- her blood pressures adequately well-controlled, recent electrolytes and renal function were normal.   I am having Ms. Besecker start on terbinafine. I am also having her maintain her ibuprofen, cyanocobalamin, KLOR-CON M10, hydrochlorothiazide, escitalopram, and CONTRAVE. We administered cyanocobalamin. We will continue to administer cyanocobalamin.  Meds ordered this encounter  Medications  . terbinafine (LAMISIL) 250 MG tablet    Sig: Take 1 tablet (250 mg total) by mouth daily.    Dispense:  30 tablet    Refill:  0  . cyanocobalamin ((VITAMIN B-12)) injection 1,000 mcg    Sig:      Follow-up: Return in about 4 weeks (around 02/12/2016).  Scarlette Calico, MD

## 2016-01-15 NOTE — Patient Instructions (Signed)
Nail Ringworm A fungal infection of the nail (tinea unguium/onychomycosis) is common. It is common as the visible part of the nail is composed of dead cells which have no blood supply to help prevent infection. It occurs because fungi are everywhere and will pick any opportunity to grow on any dead material. Because nails are very slow growing they require up to 2 years of treatment with anti-fungal medications. The entire nail back to the base is infected. This includes approximately  of the nail which you cannot see. If your caregiver has prescribed a medication by mouth, take it every day and as directed. No progress will be seen for at least 6 to 9 months. Do not be disappointed! Because fungi live on dead cells with little or no exposure to blood supply, medication delivery to the infection is slow; thus the cure is slow. It is also why you can observe no progress in the first 6 months. The nail becoming cured is the base of the nail, as it has the blood supply. Topical medication such as creams and ointments are usually not effective. Important in successful treatment of nail fungus is closely following the medication regimen that your doctor prescribes. Sometimes you and your caregiver may elect to speed up this process by surgical removal of all the nails. Even this may still require 6 to 9 months of additional oral medications. See your caregiver as directed. Remember there will be no visible improvement for at least 6 months. See your caregiver sooner if other signs of infection (redness and swelling) develop.   This information is not intended to replace advice given to you by your health care provider. Make sure you discuss any questions you have with your health care provider.   Document Released: 07/18/2000 Document Revised: 12/05/2014 Document Reviewed: 01/22/2015 Elsevier Interactive Patient Education 2016 Elsevier Inc.  

## 2016-01-15 NOTE — Progress Notes (Signed)
Pre visit review using our clinic review tool, if applicable. No additional management support is needed unless otherwise documented below in the visit note. 

## 2016-01-16 ENCOUNTER — Encounter: Payer: Self-pay | Admitting: Internal Medicine

## 2016-02-01 ENCOUNTER — Ambulatory Visit (INDEPENDENT_AMBULATORY_CARE_PROVIDER_SITE_OTHER): Payer: Federal, State, Local not specified - PPO

## 2016-02-01 DIAGNOSIS — E538 Deficiency of other specified B group vitamins: Secondary | ICD-10-CM | POA: Diagnosis not present

## 2016-02-01 MED ORDER — CYANOCOBALAMIN 1000 MCG/ML IJ SOLN
1000.0000 ug | Freq: Once | INTRAMUSCULAR | Status: AC
  Administered 2016-02-01: 1000 ug via INTRAMUSCULAR

## 2016-02-03 ENCOUNTER — Other Ambulatory Visit: Payer: Self-pay | Admitting: Internal Medicine

## 2016-02-11 DIAGNOSIS — M533 Sacrococcygeal disorders, not elsewhere classified: Secondary | ICD-10-CM | POA: Diagnosis not present

## 2016-02-11 DIAGNOSIS — M545 Low back pain: Secondary | ICD-10-CM | POA: Diagnosis not present

## 2016-02-18 ENCOUNTER — Other Ambulatory Visit: Payer: Self-pay | Admitting: Internal Medicine

## 2016-02-19 NOTE — Telephone Encounter (Signed)
Done erx 

## 2016-02-21 DIAGNOSIS — M533 Sacrococcygeal disorders, not elsewhere classified: Secondary | ICD-10-CM | POA: Diagnosis not present

## 2016-02-22 ENCOUNTER — Ambulatory Visit: Payer: Federal, State, Local not specified - PPO

## 2016-02-26 ENCOUNTER — Ambulatory Visit (INDEPENDENT_AMBULATORY_CARE_PROVIDER_SITE_OTHER): Payer: Federal, State, Local not specified - PPO | Admitting: *Deleted

## 2016-02-26 DIAGNOSIS — E538 Deficiency of other specified B group vitamins: Secondary | ICD-10-CM

## 2016-02-26 MED ORDER — CYANOCOBALAMIN 1000 MCG/ML IJ SOLN
1000.0000 ug | Freq: Once | INTRAMUSCULAR | Status: AC
  Administered 2016-02-26: 1000 ug via INTRAMUSCULAR

## 2016-03-05 ENCOUNTER — Ambulatory Visit (INDEPENDENT_AMBULATORY_CARE_PROVIDER_SITE_OTHER): Payer: Federal, State, Local not specified - PPO | Admitting: Internal Medicine

## 2016-03-05 ENCOUNTER — Encounter: Payer: Self-pay | Admitting: Internal Medicine

## 2016-03-05 ENCOUNTER — Other Ambulatory Visit (INDEPENDENT_AMBULATORY_CARE_PROVIDER_SITE_OTHER): Payer: Federal, State, Local not specified - PPO

## 2016-03-05 VITALS — BP 138/88 | HR 96 | Temp 98.1°F | Resp 16 | Ht 69.0 in | Wt 221.1 lb

## 2016-03-05 DIAGNOSIS — J301 Allergic rhinitis due to pollen: Secondary | ICD-10-CM | POA: Diagnosis not present

## 2016-03-05 DIAGNOSIS — E876 Hypokalemia: Secondary | ICD-10-CM | POA: Diagnosis not present

## 2016-03-05 DIAGNOSIS — B351 Tinea unguium: Secondary | ICD-10-CM

## 2016-03-05 DIAGNOSIS — I10 Essential (primary) hypertension: Secondary | ICD-10-CM | POA: Diagnosis not present

## 2016-03-05 DIAGNOSIS — H6980 Other specified disorders of Eustachian tube, unspecified ear: Secondary | ICD-10-CM | POA: Diagnosis not present

## 2016-03-05 LAB — COMPREHENSIVE METABOLIC PANEL
ALT: 27 U/L (ref 0–35)
AST: 15 U/L (ref 0–37)
Albumin: 4.1 g/dL (ref 3.5–5.2)
Alkaline Phosphatase: 52 U/L (ref 39–117)
BILIRUBIN TOTAL: 0.4 mg/dL (ref 0.2–1.2)
BUN: 11 mg/dL (ref 6–23)
CHLORIDE: 104 meq/L (ref 96–112)
CO2: 28 meq/L (ref 19–32)
CREATININE: 0.92 mg/dL (ref 0.40–1.20)
Calcium: 10.1 mg/dL (ref 8.4–10.5)
GFR: 83.5 mL/min (ref >60.00)
GLUCOSE: 99 mg/dL (ref 70–99)
Potassium: 3.7 mEq/L (ref 3.5–5.1)
Sodium: 138 mEq/L (ref 135–145)
Total Protein: 7.3 g/dL (ref 6.0–8.3)

## 2016-03-05 MED ORDER — MOMETASONE FUROATE 50 MCG/ACT NA SUSP: 4.0000 | Freq: Every day | NASAL | 12 refills | Status: DC

## 2016-03-05 MED ORDER — TERBINAFINE HCL 250 MG PO TABS: 250.0000 mg | ORAL_TABLET | Freq: Every day | ORAL | 1 refills | Status: DC

## 2016-03-05 MED ORDER — CETIRIZINE HCL 10 MG PO TABS: 10.0000 mg | ORAL_TABLET | Freq: Every day | ORAL | 11 refills | Status: DC

## 2016-03-05 NOTE — Patient Instructions (Signed)
Hypertension Hypertension, commonly called high blood pressure, is when the force of blood pumping through your arteries is too strong. Your arteries are the blood vessels that carry blood from your heart throughout your body. A blood pressure reading consists of a higher number over a lower number, such as 110/72. The higher number (systolic) is the pressure inside your arteries when your heart pumps. The lower number (diastolic) is the pressure inside your arteries when your heart relaxes. Ideally you want your blood pressure below 120/80. Hypertension forces your heart to work harder to pump blood. Your arteries may become narrow or stiff. Having untreated or uncontrolled hypertension can cause heart attack, stroke, kidney disease, and other problems. RISK FACTORS Some risk factors for high blood pressure are controllable. Others are not.  Risk factors you cannot control include:   Race. You may be at higher risk if you are African American.  Age. Risk increases with age.  Gender. Men are at higher risk than women before age 45 years. After age 65, women are at higher risk than men. Risk factors you can control include:  Not getting enough exercise or physical activity.  Being overweight.  Getting too much fat, sugar, calories, or salt in your diet.  Drinking too much alcohol. SIGNS AND SYMPTOMS Hypertension does not usually cause signs or symptoms. Extremely high blood pressure (hypertensive crisis) may cause headache, anxiety, shortness of breath, and nosebleed. DIAGNOSIS To check if you have hypertension, your health care provider will measure your blood pressure while you are seated, with your arm held at the level of your heart. It should be measured at least twice using the same arm. Certain conditions can cause a difference in blood pressure between your right and left arms. A blood pressure reading that is higher than normal on one occasion does not mean that you need treatment. If  it is not clear whether you have high blood pressure, you may be asked to return on a different day to have your blood pressure checked again. Or, you may be asked to monitor your blood pressure at home for 1 or more weeks. TREATMENT Treating high blood pressure includes making lifestyle changes and possibly taking medicine. Living a healthy lifestyle can help lower high blood pressure. You may need to change some of your habits. Lifestyle changes may include:  Following the DASH diet. This diet is high in fruits, vegetables, and whole grains. It is low in salt, red meat, and added sugars.  Keep your sodium intake below 2,300 mg per day.  Getting at least 30-45 minutes of aerobic exercise at least 4 times per week.  Losing weight if necessary.  Not smoking.  Limiting alcoholic beverages.  Learning ways to reduce stress. Your health care provider may prescribe medicine if lifestyle changes are not enough to get your blood pressure under control, and if one of the following is true:  You are 18-59 years of age and your systolic blood pressure is above 140.  You are 60 years of age or older, and your systolic blood pressure is above 150.  Your diastolic blood pressure is above 90.  You have diabetes, and your systolic blood pressure is over 140 or your diastolic blood pressure is over 90.  You have kidney disease and your blood pressure is above 140/90.  You have heart disease and your blood pressure is above 140/90. Your personal target blood pressure may vary depending on your medical conditions, your age, and other factors. HOME CARE INSTRUCTIONS    Have your blood pressure rechecked as directed by your health care provider.   Take medicines only as directed by your health care provider. Follow the directions carefully. Blood pressure medicines must be taken as prescribed. The medicine does not work as well when you skip doses. Skipping doses also puts you at risk for  problems.  Do not smoke.   Monitor your blood pressure at home as directed by your health care provider. SEEK MEDICAL CARE IF:   You think you are having a reaction to medicines taken.  You have recurrent headaches or feel dizzy.  You have swelling in your ankles.  You have trouble with your vision. SEEK IMMEDIATE MEDICAL CARE IF:  You develop a severe headache or confusion.  You have unusual weakness, numbness, or feel faint.  You have severe chest or abdominal pain.  You vomit repeatedly.  You have trouble breathing. MAKE SURE YOU:   Understand these instructions.  Will watch your condition.  Will get help right away if you are not doing well or get worse.   This information is not intended to replace advice given to you by your health care provider. Make sure you discuss any questions you have with your health care provider.   Document Released: 07/21/2005 Document Revised: 12/05/2014 Document Reviewed: 05/13/2013 Elsevier Interactive Patient Education 2016 Elsevier Inc.  

## 2016-03-05 NOTE — Progress Notes (Signed)
Subjective:  Patient ID: Mary Sanford, female    DOB: 1966-06-01  Age: 50 y.o. MRN: [id number]  CC: Follow-up and Hypertension   HPI Mary Sanford presents for a blood pressure check, follow-up on onychomycosis, and concerns about nasal congestion and popping in both ears.  Her blood pressures been well controlled.  She is taking terbinafine for a month for toenail onychomycosis and she is tolerating it well with no abdominal pain/nausea/vomiting/rash/loss of appetite/or weight loss.  She complains for several weeks of nasal congestion and a sensation of popping and crackling in both ears.  Outpatient Medications Prior to Visit  Medication Sig Dispense Refill  . CONTRAVE 8-90 MG TB12 TAKE TWO TABLETS BY MOUTH TWICE DAILY 120 tablet 0  . cyanocobalamin (,VITAMIN B-12,) 1000 MCG/ML injection Inject 1,000 mcg into the muscle every 14 (fourteen) days.    Marland Kitchen escitalopram (LEXAPRO) 10 MG tablet Take 1 tablet (10 mg total) by mouth daily. 90 tablet 3  . hydrochlorothiazide (HYDRODIURIL) 25 MG tablet TAKE ONE TABLET BY MOUTH ONCE DAILY 90 tablet 1  . ibuprofen (ADVIL,MOTRIN) 200 MG tablet Take 600 mg by mouth every 6 (six) hours as needed for headache or moderate pain.     Marland Kitchen KLOR-CON M10 10 MEQ tablet TAKE ONE TABLET BY MOUTH TWICE DAILY 180 tablet 1  . terbinafine (LAMISIL) 250 MG tablet Take 1 tablet (250 mg total) by mouth daily. 30 tablet 0   Facility-Administered Medications Prior to Visit  Medication Dose Route Frequency Provider Last Rate Last Dose  . cyanocobalamin ((VITAMIN B-12)) injection 1,000 mcg  1,000 mcg Intramuscular Q14 Days [name], MD   1,000 mcg at 12/05/13 1651    ROS Review of Systems  Constitutional: Negative.  Negative for activity change, appetite change, diaphoresis, fatigue and unexpected weight change.  HENT: Positive for congestion and ear pain. Negative for facial swelling, nosebleeds, postnasal drip, rhinorrhea, sinus pressure, sneezing, sore  throat, tinnitus, trouble swallowing and voice change.   Eyes: Negative.   Respiratory: Negative.  Negative for cough, choking, shortness of breath and stridor.   Cardiovascular: Negative.  Negative for chest pain, palpitations and leg swelling.  Gastrointestinal: Negative.  Negative for abdominal pain, diarrhea, nausea and vomiting.  Endocrine: Negative.   Genitourinary: Negative.  Negative for difficulty urinating.  Musculoskeletal: Negative.  Negative for back pain, myalgias and neck pain.  Skin: Negative.  Negative for color change and rash.  Allergic/Immunologic: Negative.   Neurological: Negative.  Negative for dizziness and weakness.  Hematological: Negative.  Negative for adenopathy. Does not bruise/bleed easily.  Psychiatric/Behavioral: Negative.     Objective:  BP 138/88   Pulse 96   Temp 98.1 F (36.7 C) (Oral)   Resp 16   Ht 5\' 9"  (1.753 m)   Wt 221 lb 1.3 oz (100.3 kg)   LMP 02/04/2016   SpO2 94%   BMI 32.65 kg/m   BP Readings from Last 3 Encounters:  03/05/16 138/88  01/15/16 122/86  01/04/16 108/82    Wt Readings from Last 3 Encounters:  03/05/16 221 lb 1.3 oz (100.3 kg)  01/15/16 230 lb (104.3 kg)  01/04/16 235 lb (106.6 kg)    Physical Exam  Constitutional: She is oriented to person, place, and time. No distress.  HENT:  Right Ear: Hearing, tympanic membrane, external ear and ear canal normal.  Left Ear: Hearing, tympanic membrane, external ear and ear canal normal.  Nose: Mucosal edema present. No rhinorrhea or sinus tenderness. Right sinus exhibits no maxillary  sinus tenderness and no frontal sinus tenderness. Left sinus exhibits no maxillary sinus tenderness and no frontal sinus tenderness.  Mouth/Throat: Oropharynx is clear and moist and mucous membranes are normal. Mucous membranes are not pale, not dry and not cyanotic. No oropharyngeal exudate, posterior oropharyngeal edema, posterior oropharyngeal erythema or tonsillar abscesses.  Eyes:  Conjunctivae are normal. Right eye exhibits no discharge. Left eye exhibits no discharge. No scleral icterus.  Neck: Normal range of motion. Neck supple. No JVD present. No tracheal deviation present. No thyromegaly present.  Cardiovascular: Normal rate, regular rhythm, normal heart sounds and intact distal pulses.  Exam reveals no gallop and no friction rub.   No murmur heard. Pulmonary/Chest: Effort normal and breath sounds normal. No stridor. No respiratory distress. She has no wheezes. She has no rales. She exhibits no tenderness.  Abdominal: Soft. Bowel sounds are normal. She exhibits no distension and no mass. There is no tenderness. There is no rebound and no guarding.  Musculoskeletal: Normal range of motion. She exhibits no edema, tenderness or deformity.  Lymphadenopathy:    She has no cervical adenopathy.  Neurological: She is oriented to person, place, and time.  Skin: Skin is warm and dry. No rash noted. She is not diaphoretic. No erythema. No pallor.  Vitals reviewed.   Lab Results  Component Value Date   WBC 6.3 12/28/2015   HGB 13.5 12/28/2015   HCT 42.3 12/28/2015   PLT 272.0 12/28/2015   GLUCOSE 99 03/05/2016   CHOL 141 04/17/2015   TRIG 177.0 (H) 04/17/2015   HDL 45.70 04/17/2015   LDLCALC 59 04/17/2015   ALT 27 03/05/2016   AST 15 03/05/2016   NA 138 03/05/2016   K 3.7 03/05/2016   CL 104 03/05/2016   CREATININE 0.92 03/05/2016   BUN 11 03/05/2016   CO2 28 03/05/2016   TSH 0.85 12/28/2015   HGBA1C 6.1 12/28/2015    Mm Screening Breast Tomo Bilateral  Result Date: 10/16/2015 CLINICAL DATA:  Screening. EXAM: DIGITAL SCREENING BILATERAL MAMMOGRAM WITH 3D TOMO WITH CAD COMPARISON:  Previous exam(s). ACR Breast Density Category b: There are scattered areas of fibroglandular density. FINDINGS: There are no findings suspicious for malignancy. Images were processed with CAD. IMPRESSION: No mammographic evidence of malignancy. A result letter of this screening  mammogram will be mailed directly to the patient. RECOMMENDATION: Screening mammogram in one year. (Code:SM-B-01Y) BI-RADS CATEGORY  1: Negative. Electronically Signed   By: Claudie Revering M.D.   On: 10/16/2015 16:47    Assessment & Plan:   Nohemy was seen today for follow-up and hypertension.  Diagnoses and all orders for this visit:  Allergic rhinitis due to pollen- will start treating her symptoms with a steroid nasal spray and an oral antihistamine. Will avoid decongestants due to her history of hypertension. -     mometasone (NASONEX) 50 MCG/ACT nasal spray; Place 4 sprays into the nose daily. -     cetirizine (ZYRTEC) 10 MG tablet; Take 1 tablet (10 mg total) by mouth daily.  Eustachian tube dysfunction, unspecified laterality- as above. -     mometasone (NASONEX) 50 MCG/ACT nasal spray; Place 4 sprays into the nose daily. -     cetirizine (ZYRTEC) 10 MG tablet; Take 1 tablet (10 mg total) by mouth daily.  Essential hypertension, benign- her blood pressure is well-controlled, electrolytes and renal function are stable. -     Comprehensive metabolic panel; Future  Onychomycosis of toenail- she is tolerating terbinafine well and she has had no side  effects and her liver enzymes are normal, I've recommended that she take another 2 months worth of terbinafine to complete her treatment of toenail onychomycosis. -     Comprehensive metabolic panel; Future -     terbinafine (LAMISIL) 250 MG tablet; Take 1 tablet (250 mg total) by mouth daily.  Hypokalemia- improvement noted -     Comprehensive metabolic panel; Future   I am having Ms. Mesenbrink start on mometasone and cetirizine. I am also having her maintain her ibuprofen, cyanocobalamin, KLOR-CON M10, escitalopram, hydrochlorothiazide, CONTRAVE, and terbinafine. We will continue to administer cyanocobalamin.  Meds ordered this encounter  Medications  . mometasone (NASONEX) 50 MCG/ACT nasal spray    Sig: Place 4 sprays into the nose  daily.    Dispense:  17 g    Refill:  12  . cetirizine (ZYRTEC) 10 MG tablet    Sig: Take 1 tablet (10 mg total) by mouth daily.    Dispense:  30 tablet    Refill:  11  . terbinafine (LAMISIL) 250 MG tablet    Sig: Take 1 tablet (250 mg total) by mouth daily.    Dispense:  30 tablet    Refill:  1     Follow-up: Return in about 6 months (around 09/05/2016).  Scarlette Calico, MD

## 2016-03-14 ENCOUNTER — Ambulatory Visit (INDEPENDENT_AMBULATORY_CARE_PROVIDER_SITE_OTHER): Payer: Federal, State, Local not specified - PPO

## 2016-03-14 DIAGNOSIS — E538 Deficiency of other specified B group vitamins: Secondary | ICD-10-CM

## 2016-03-14 MED ORDER — CYANOCOBALAMIN 1000 MCG/ML IJ SOLN
1000.0000 ug | Freq: Once | INTRAMUSCULAR | Status: AC
  Administered 2016-03-14: 1000 ug via INTRAMUSCULAR

## 2016-03-25 ENCOUNTER — Other Ambulatory Visit: Payer: Self-pay | Admitting: Internal Medicine

## 2016-03-31 ENCOUNTER — Ambulatory Visit (INDEPENDENT_AMBULATORY_CARE_PROVIDER_SITE_OTHER): Payer: Federal, State, Local not specified - PPO

## 2016-03-31 DIAGNOSIS — D519 Vitamin B12 deficiency anemia, unspecified: Secondary | ICD-10-CM | POA: Diagnosis not present

## 2016-03-31 MED ORDER — CYANOCOBALAMIN 1000 MCG/ML IJ SOLN
1000.0000 ug | Freq: Once | INTRAMUSCULAR | Status: AC
  Administered 2016-03-31: 1000 ug via INTRAMUSCULAR

## 2016-04-18 ENCOUNTER — Ambulatory Visit (INDEPENDENT_AMBULATORY_CARE_PROVIDER_SITE_OTHER): Payer: Federal, State, Local not specified - PPO

## 2016-04-18 DIAGNOSIS — D519 Vitamin B12 deficiency anemia, unspecified: Secondary | ICD-10-CM

## 2016-04-18 MED ORDER — CYANOCOBALAMIN 1000 MCG/ML IJ SOLN
1000.0000 ug | Freq: Once | INTRAMUSCULAR | Status: AC
  Administered 2016-04-18: 1000 ug via INTRAMUSCULAR

## 2016-04-20 NOTE — Progress Notes (Signed)
I agree with the injection as part of the treatment plan. 

## 2016-04-30 ENCOUNTER — Ambulatory Visit: Payer: Federal, State, Local not specified - PPO | Admitting: Family Medicine

## 2016-05-06 ENCOUNTER — Other Ambulatory Visit: Payer: Self-pay

## 2016-05-06 ENCOUNTER — Ambulatory Visit (INDEPENDENT_AMBULATORY_CARE_PROVIDER_SITE_OTHER): Payer: Federal, State, Local not specified - PPO | Admitting: Family Medicine

## 2016-05-06 ENCOUNTER — Encounter: Payer: Self-pay | Admitting: Family Medicine

## 2016-05-06 VITALS — BP 118/80 | HR 73

## 2016-05-06 DIAGNOSIS — M1812 Unilateral primary osteoarthritis of first carpometacarpal joint, left hand: Secondary | ICD-10-CM

## 2016-05-06 DIAGNOSIS — M79642 Pain in left hand: Secondary | ICD-10-CM

## 2016-05-06 DIAGNOSIS — D518 Other vitamin B12 deficiency anemias: Secondary | ICD-10-CM | POA: Diagnosis not present

## 2016-05-06 MED ORDER — CYANOCOBALAMIN 1000 MCG/ML IJ SOLN
1000.0000 ug | Freq: Once | INTRAMUSCULAR | Status: AC
  Administered 2016-05-06: 1000 ug via INTRAMUSCULAR

## 2016-05-06 MED ORDER — DICLOFENAC SODIUM 2 % TD SOLN: 2.0000 "application " | Freq: Two times a day (BID) | TRANSDERMAL | 3 refills | Status: DC

## 2016-05-06 NOTE — Progress Notes (Signed)
[facility] [address], [place] [address] Phone: [phone] Subjective:    I'm seeing this patient by the request  of:  Scarlette Calico, MD   CC: left wrist pain f/u  [id number]  Mary Sanford is a 50 y.o. female coming in with complaint of left wrist pain. Seems to be more of a long-standing process but did have a UCL injury. Significant laxity of the UCL was noted as well as a de Quervain's tenosynovitis. Patient was given an injection for the de Quervain's 11 months ago. Patient was sent to a hand surgeon for further evaluation. specialist made her custom braces and send her to occupational therapy for this. Patient wanted to avoid conservative therapy for some time. Patient states unfortunate pain never seemed to really improve and did not like the interaction she had with the other surgeon Mary Sanford is back here for further evaluation. It is affecting daily activities. Feels that the braces do not seem to be helping as much.  X-rays taken December 2016 were independently visualized by me showing some mild to moderate degenerative changes of the [facility] joint   Past Medical History:  Diagnosis Date  . Anemia   . Anxiety   . B12 deficiency   . Cough    non- productive  . Depression    no meds  . Hypertension   . Kidney stones    Past Surgical History:  Procedure Laterality Date  . caesarean section    . cystoscopic     extraction of ureteric calculus without disintegration  . DILITATION & CURRETTAGE/HYSTROSCOPY WITH NOVASURE ABLATION N/A 06/17/2013   Procedure: DILATATION & CURETTAGE/HYSTEROSCOPY WITH NOVASURE ABLATION;  Surgeon: Osborne Oman, MD;  Location: [facility];  Service: Gynecology;  Laterality: N/A;  . HEMORRHOIDECTOMY WITH HEMORRHOID BANDING  10/19/2012   SCA THD Hem ligation/pexy  . TUBAL LIGATION    . WISDOM TOOTH EXTRACTION     Social History  Substance Use Topics  . Smoking status: Former Smoker    Types: Cigarettes   Quit date: 08/05/1999  . Smokeless tobacco: Never Used  . Alcohol use Yes   Allergies  Allergen Reactions  . Naproxen     Panic attacks   Family History  Problem Relation Age of Onset  . Asthma    . Hypothyroidism    . Early death Neg Hx   . Heart disease Paternal Grandfather     >55  . Hyperlipidemia Neg Hx   . Kidney disease Neg Hx   . Stroke Maternal Uncle     > 55  . Hypertension Mother   . Hypothyroidism Mother   . Asthma Mother   . Cancer Mother 81    Colon and rectal  . Hypotension Sister   . Hypothyroidism Sister   . Diabetes Sister   . Seizures Brother   . Hypothyroidism Sister   . Seizures Sister   . Anemia Sister   . Deep vein thrombosis Sister       Past medical history, social, surgical and family history all reviewed in electronic medical record.   Review of Systems: No headache, visual changes, nausea, vomiting, diarrhea, constipation, dizziness, abdominal pain, skin rash, fevers, chills, night sweats, weight loss, swollen lymph nodes, body aches, joint swelling, muscle aches, chest pain, shortness of breath, mood changes.   Objective  Blood pressure 118/80, pulse 73, last menstrual period 02/04/2016, SpO2 98 %.  General: No apparent distress alert and oriented x3 mood and affect  normal, dressed appropriately.  HEENT: Pupils equal, extraocular movements intact  Respiratory: Patient's speak in full sentences and does not appear short of breath  Cardiovascular: No lower extremity edema, non tender, no erythema  Skin: Warm dry intact with no signs of infection or rash on extremities or on axial skeleton.  Abdomen: Soft nontender  Neuro: Cranial nerves II through XII are intact, neurovascularly intact in all extremities with 2+ DTRs and 2+ pulses.  Lymph: No lymphadenopathy of posterior or anterior cervical chain or axillae bilaterally.  Gait normal with good balance and coordination.  MSK:  Non tender with full range of motion and good stability and  symmetric strength and tone of shoulders, elbows, hip, knee and ankles bilaterally.  [place] Inspection normal with no visible erythema or decrease swelling from previous exam but still has tenderness over the UCL and medial aspect of the thenar eminence ROM smooth and normal with good flexion and extension and ulnar/radial deviation that is symmetrical with opposite wrist. Palpation is normal over metacarpals, navicular, lunate, and TFCC; tendons without tenderness/ swelling No snuffbox tenderness. No tenderness over Canal of [place]. Strength 5/5 in all directions without pain. Continued laxity of the anterior cruciate ligament. Positive grind test which is new. Contralateral hand unremarkable  Procedure: Real-time Ultrasound Guided Injection of left CMC joint Device: GE Logiq E  Ultrasound guided injection is preferred based studies that show increased duration, increased effect, greater accuracy, decreased procedural pain, increased response rate, and decreased cost with ultrasound guided versus blind injection.  Verbal informed consent obtained.  Time-out conducted.  Noted no overlying erythema, induration, or other signs of local infection.  Skin prepped in a sterile fashion.  Local anesthesia: Topical Ethyl chloride.  With sterile technique and under real time ultrasound guidance:  With a 25-gauge Mention was injected with a total of 0.5 mL of 0.5% Marcaine and 0.5 mL of Kenalog 40 mg/dL. Into the [facility] joint. Completed without difficulty  Pain immediately resolved suggesting accurate placement of the medication.  Advised to call if fevers/chills, erythema, induration, drainage, or persistent bleeding.  Images permanently stored and available for review in the ultrasound unit.  Impression: Technically successful ultrasound guided injection.         Impression and Recommendations:     This case required medical decision making of moderate complexity.

## 2016-05-06 NOTE — Assessment & Plan Note (Signed)
Given injection today and tolerated the procedure well. We discussed icing regimen, custom brace was placed today as well to avoid repetitive activity. We discussed icing regimen. Discussed which activities to do an which was potentially avoid. We discussed with patient that this will likely be a recurrent symptoms. If worsening symptoms we'll consider injections every 3 months if necessary. Patient follow-up with me again in 3 weeks to make sure she is improving.

## 2016-05-06 NOTE — Patient Instructions (Addendum)
Good to see you  Ice is your friend pennsaid pinkie amount topically 2 times daily as needed.  Avoid heavy lifting on theat side, like your gun.  Wear brace with a lot of activity  See me again in 3 weeks if not a lot better.

## 2016-05-16 ENCOUNTER — Other Ambulatory Visit: Payer: Self-pay | Admitting: Internal Medicine

## 2016-05-22 ENCOUNTER — Encounter: Payer: Self-pay | Admitting: Internal Medicine

## 2016-05-22 ENCOUNTER — Other Ambulatory Visit (INDEPENDENT_AMBULATORY_CARE_PROVIDER_SITE_OTHER): Payer: Federal, State, Local not specified - PPO

## 2016-05-22 ENCOUNTER — Ambulatory Visit (INDEPENDENT_AMBULATORY_CARE_PROVIDER_SITE_OTHER): Payer: Federal, State, Local not specified - PPO | Admitting: Internal Medicine

## 2016-05-22 ENCOUNTER — Other Ambulatory Visit: Payer: Self-pay | Admitting: Internal Medicine

## 2016-05-22 ENCOUNTER — Other Ambulatory Visit: Payer: Federal, State, Local not specified - PPO

## 2016-05-22 VITALS — BP 126/88 | HR 88 | Temp 98.7°F | Resp 16 | Ht 69.0 in | Wt 227.0 lb

## 2016-05-22 DIAGNOSIS — E876 Hypokalemia: Secondary | ICD-10-CM

## 2016-05-22 DIAGNOSIS — B351 Tinea unguium: Secondary | ICD-10-CM

## 2016-05-22 DIAGNOSIS — M79642 Pain in left hand: Secondary | ICD-10-CM

## 2016-05-22 DIAGNOSIS — D500 Iron deficiency anemia secondary to blood loss (chronic): Secondary | ICD-10-CM

## 2016-05-22 DIAGNOSIS — D518 Other vitamin B12 deficiency anemias: Secondary | ICD-10-CM

## 2016-05-22 DIAGNOSIS — I10 Essential (primary) hypertension: Secondary | ICD-10-CM | POA: Diagnosis not present

## 2016-05-22 DIAGNOSIS — R7303 Prediabetes: Secondary | ICD-10-CM | POA: Diagnosis not present

## 2016-05-22 DIAGNOSIS — D519 Vitamin B12 deficiency anemia, unspecified: Secondary | ICD-10-CM | POA: Diagnosis not present

## 2016-05-22 LAB — BASIC METABOLIC PANEL
BUN: 10 mg/dL (ref 6–23)
CALCIUM: 9.9 mg/dL (ref 8.4–10.5)
CHLORIDE: 104 meq/L (ref 96–112)
CO2: 31 meq/L (ref 19–32)
Creatinine, Ser: 0.87 mg/dL (ref 0.40–1.20)
GFR: 88.99 mL/min (ref >60.00)
GLUCOSE: 92 mg/dL (ref 70–99)
POTASSIUM: 4.2 meq/L (ref 3.5–5.1)
SODIUM: 141 meq/L (ref 135–145)

## 2016-05-22 LAB — CBC WITH DIFFERENTIAL/PLATELET
Basophils Absolute: 0 10*3/uL (ref 0.0–0.1)
Basophils Relative: 0.7 % (ref 0.0–3.0)
EOS PCT: 2.6 % (ref 0.0–5.0)
Eosinophils Absolute: 0.1 10*3/uL (ref 0.0–0.7)
HCT: 42.6 % (ref 36.0–46.0)
Hemoglobin: 13.6 g/dL (ref 12.0–15.0)
LYMPHS ABS: 2 10*3/uL (ref 0.7–4.0)
Lymphocytes Relative: 35.9 % (ref 12.0–46.0)
MCHC: 32.1 g/dL (ref 30.0–36.0)
MCV: 72.9 fl — AB (ref 78.0–100.0)
MONO ABS: 0.6 10*3/uL (ref 0.1–1.0)
MONOS PCT: 10.7 % (ref 3.0–12.0)
NEUTROS ABS: 2.7 10*3/uL (ref 1.4–7.7)
NEUTROS PCT: 50.1 % (ref 43.0–77.0)
PLATELETS: 256 10*3/uL (ref 150.0–400.0)
RBC: 5.84 Mil/uL — AB (ref 3.87–5.11)
RDW: 14.4 % (ref 11.5–15.5)
WBC: 5.5 10*3/uL (ref 4.0–10.5)

## 2016-05-22 LAB — HEMOGLOBIN A1C: Hgb A1c MFr Bld: 5.7 % (ref 4.6–6.5)

## 2016-05-22 LAB — HEPATIC FUNCTION PANEL
ALBUMIN: 4 g/dL (ref 3.5–5.2)
ALT: 19 U/L (ref 0–35)
AST: 13 U/L (ref 0–37)
Alkaline Phosphatase: 53 U/L (ref 39–117)
BILIRUBIN TOTAL: 0.4 mg/dL (ref 0.2–1.2)
Bilirubin, Direct: 0.1 mg/dL (ref 0.0–0.3)
Total Protein: 7 g/dL (ref 6.0–8.3)

## 2016-05-22 NOTE — Progress Notes (Signed)
Pre visit review using our clinic review tool, if applicable. No additional management support is needed unless otherwise documented below in the visit note. 

## 2016-05-22 NOTE — Patient Instructions (Signed)
Hypertension Hypertension, commonly called high blood pressure, is when the force of blood pumping through your arteries is too strong. Your arteries are the blood vessels that carry blood from your heart throughout your body. A blood pressure reading consists of a higher number over a lower number, such as 110/72. The higher number (systolic) is the pressure inside your arteries when your heart pumps. The lower number (diastolic) is the pressure inside your arteries when your heart relaxes. Ideally you want your blood pressure below 120/80. Hypertension forces your heart to work harder to pump blood. Your arteries may become narrow or stiff. Having untreated or uncontrolled hypertension can cause heart attack, stroke, kidney disease, and other problems. RISK FACTORS Some risk factors for high blood pressure are controllable. Others are not.  Risk factors you cannot control include:   Race. You may be at higher risk if you are African American.  Age. Risk increases with age.  Gender. Men are at higher risk than women before age 45 years. After age 65, women are at higher risk than men. Risk factors you can control include:  Not getting enough exercise or physical activity.  Being overweight.  Getting too much fat, sugar, calories, or salt in your diet.  Drinking too much alcohol. SIGNS AND SYMPTOMS Hypertension does not usually cause signs or symptoms. Extremely high blood pressure (hypertensive crisis) may cause headache, anxiety, shortness of breath, and nosebleed. DIAGNOSIS To check if you have hypertension, your health care provider will measure your blood pressure while you are seated, with your arm held at the level of your heart. It should be measured at least twice using the same arm. Certain conditions can cause a difference in blood pressure between your right and left arms. A blood pressure reading that is higher than normal on one occasion does not mean that you need treatment. If  it is not clear whether you have high blood pressure, you may be asked to return on a different day to have your blood pressure checked again. Or, you may be asked to monitor your blood pressure at home for 1 or more weeks. TREATMENT Treating high blood pressure includes making lifestyle changes and possibly taking medicine. Living a healthy lifestyle can help lower high blood pressure. You may need to change some of your habits. Lifestyle changes may include:  Following the DASH diet. This diet is high in fruits, vegetables, and whole grains. It is low in salt, red meat, and added sugars.  Keep your sodium intake below 2,300 mg per day.  Getting at least 30-45 minutes of aerobic exercise at least 4 times per week.  Losing weight if necessary.  Not smoking.  Limiting alcoholic beverages.  Learning ways to reduce stress. Your health care provider may prescribe medicine if lifestyle changes are not enough to get your blood pressure under control, and if one of the following is true:  You are 18-59 years of age and your systolic blood pressure is above 140.  You are 60 years of age or older, and your systolic blood pressure is above 150.  Your diastolic blood pressure is above 90.  You have diabetes, and your systolic blood pressure is over 140 or your diastolic blood pressure is over 90.  You have kidney disease and your blood pressure is above 140/90.  You have heart disease and your blood pressure is above 140/90. Your personal target blood pressure may vary depending on your medical conditions, your age, and other factors. HOME CARE INSTRUCTIONS    Have your blood pressure rechecked as directed by your health care provider.   Take medicines only as directed by your health care provider. Follow the directions carefully. Blood pressure medicines must be taken as prescribed. The medicine does not work as well when you skip doses. Skipping doses also puts you at risk for  problems.  Do not smoke.   Monitor your blood pressure at home as directed by your health care provider. SEEK MEDICAL CARE IF:   You think you are having a reaction to medicines taken.  You have recurrent headaches or feel dizzy.  You have swelling in your ankles.  You have trouble with your vision. SEEK IMMEDIATE MEDICAL CARE IF:  You develop a severe headache or confusion.  You have unusual weakness, numbness, or feel faint.  You have severe chest or abdominal pain.  You vomit repeatedly.  You have trouble breathing. MAKE SURE YOU:   Understand these instructions.  Will watch your condition.  Will get help right away if you are not doing well or get worse.   This information is not intended to replace advice given to you by your health care provider. Make sure you discuss any questions you have with your health care provider.   Document Released: 07/21/2005 Document Revised: 12/05/2014 Document Reviewed: 05/13/2013 Elsevier Interactive Patient Education 2016 Elsevier Inc.  

## 2016-05-22 NOTE — Progress Notes (Signed)
Subjective:  Patient ID: Mary Sanford, female    DOB: 1965/10/23  Age: 50 y.o. MRN: [id number]  CC: Hypertension and Anemia   HPI Mary Sanford presents for follow-up. She is tolerating terbinafine well with no abdominal pain, nausea, vomiting, or rash. She has started to notice normal toenail growth at the base of her toenails.  Outpatient Medications Prior to Visit  Medication Sig Dispense Refill  . cetirizine (ZYRTEC) 10 MG tablet Take 1 tablet (10 mg total) by mouth daily. 30 tablet 11  . CONTRAVE 8-90 MG TB12 TAKE TWO TABLETS BY MOUTH TWICE DAILY 120 tablet 11  . cyanocobalamin (,VITAMIN B-12,) 1000 MCG/ML injection Inject 1,000 mcg into the muscle every 14 (fourteen) days.    . Diclofenac Sodium (PENNSAID) 2 % SOLN Place 2 application onto the skin 2 (two) times daily. 112 g 3  . escitalopram (LEXAPRO) 10 MG tablet Take 1 tablet (10 mg total) by mouth daily. 90 tablet 3  . hydrochlorothiazide (HYDRODIURIL) 25 MG tablet TAKE ONE TABLET BY MOUTH ONCE DAILY 90 tablet 1  . ibuprofen (ADVIL,MOTRIN) 200 MG tablet Take 600 mg by mouth every 6 (six) hours as needed for headache or moderate pain.     . mometasone (NASONEX) 50 MCG/ACT nasal spray Place 4 sprays into the nose daily. 17 g 12  . potassium chloride (K-DUR,KLOR-CON) 10 MEQ tablet TAKE ONE TABLET BY MOUTH TWICE DAILY 180 tablet 1  . terbinafine (LAMISIL) 250 MG tablet Take 1 tablet (250 mg total) by mouth daily. 30 tablet 1  . cyanocobalamin ((VITAMIN B-12)) injection 1,000 mcg      No facility-administered medications prior to visit.     ROS Review of Systems  Constitutional: Negative for appetite change, chills, diaphoresis, fatigue and fever.  HENT: Negative.   Eyes: Negative for photophobia and visual disturbance.  Respiratory: Negative.  Negative for cough, choking, chest tightness, shortness of breath and stridor.   Cardiovascular: Negative.  Negative for chest pain, palpitations and leg swelling.    Gastrointestinal: Negative.  Negative for abdominal pain, constipation, diarrhea, nausea and vomiting.  Endocrine: Negative.   Genitourinary: Negative for difficulty urinating.  Musculoskeletal: Negative.  Negative for back pain, gait problem and neck pain.  Skin: Negative.  Negative for color change, pallor and rash.  Allergic/Immunologic: Negative.   Neurological: Negative.  Negative for dizziness and weakness.  Hematological: Negative.  Negative for adenopathy. Does not bruise/bleed easily.  Psychiatric/Behavioral: Negative.     Objective:  BP 126/88 (BP Location: Left Arm, Patient Position: Sitting, Cuff Size: Large)   Pulse 88   Temp 98.7 F (37.1 C)   Resp 16   Ht 5\' 9"  (1.753 m)   Wt 227 lb (103 kg)   LMP 02/03/2016   SpO2 98%   Breastfeeding? Unknown   BMI 33.52 kg/m   BP Readings from Last 3 Encounters:  05/22/16 126/88  05/06/16 118/80  03/05/16 138/88    Wt Readings from Last 3 Encounters:  05/22/16 227 lb (103 kg)  03/05/16 221 lb 1.3 oz (100.3 kg)  01/15/16 230 lb (104.3 kg)    Physical Exam  Constitutional: She is oriented to person, place, and time. No distress.  HENT:  Mouth/Throat: Oropharynx is clear and moist. No oropharyngeal exudate.  Eyes: Conjunctivae are normal. Right eye exhibits no discharge. Left eye exhibits no discharge. No scleral icterus.  Neck: Normal range of motion. Neck supple. No JVD present. No tracheal deviation present. No thyromegaly present.  Cardiovascular: Normal rate, regular rhythm,  normal heart sounds and intact distal pulses.  Exam reveals no gallop and no friction rub.   No murmur heard. Pulmonary/Chest: Effort normal and breath sounds normal. No stridor. No respiratory distress. She has no wheezes. She has no rales. She exhibits no tenderness.  Abdominal: Soft. Bowel sounds are normal. She exhibits no distension and no mass. There is no tenderness. There is no rebound and no guarding.  Musculoskeletal: Normal range of  motion. She exhibits no edema, tenderness or deformity.  Lymphadenopathy:    She has no cervical adenopathy.  Neurological: She is oriented to person, place, and time.  Skin: Skin is warm and dry. No rash noted. She is not diaphoretic. No erythema. No pallor.  The toenails on the right side are normal, on the left side at the base of the nails there is normal growth but distal to that there continues to be areas of nailbed thickening, subungual debris, and lysis.  Vitals reviewed.   Lab Results  Component Value Date   WBC 5.5 05/22/2016   HGB 13.6 05/22/2016   HCT 42.6 05/22/2016   PLT 256.0 05/22/2016   GLUCOSE 92 05/22/2016   CHOL 141 04/17/2015   TRIG 177.0 (H) 04/17/2015   HDL 45.70 04/17/2015   LDLCALC 59 04/17/2015   ALT 19 05/22/2016   AST 13 05/22/2016   NA 141 05/22/2016   K 4.2 05/22/2016   CL 104 05/22/2016   CREATININE 0.87 05/22/2016   BUN 10 05/22/2016   CO2 31 05/22/2016   TSH 0.85 12/28/2015   HGBA1C 5.7 05/22/2016    Mm Screening Breast Tomo Bilateral  Result Date: 10/16/2015 CLINICAL DATA:  Screening. EXAM: DIGITAL SCREENING BILATERAL MAMMOGRAM WITH 3D TOMO WITH CAD COMPARISON:  Previous exam(s). ACR Breast Density Category b: There are scattered areas of fibroglandular density. FINDINGS: There are no findings suspicious for malignancy. Images were processed with CAD. IMPRESSION: No mammographic evidence of malignancy. A result letter of this screening mammogram will be mailed directly to the patient. RECOMMENDATION: Screening mammogram in one year. (Code:SM-B-01Y) BI-RADS CATEGORY  1: Negative. Electronically Signed   By: Claudie Revering M.D.   On: 10/16/2015 16:47    Assessment & Plan:   Fanita was seen today for hypertension and anemia.  Diagnoses and all orders for this visit:  Essential hypertension, benign- Her blood pressures well controlled, lites and renal function are stable. -     Hepatic function panel; Future -     Basic metabolic panel;  Future  Iron deficiency anemia due to chronic blood loss- improvement noted -     CBC with Differential/Platelet; Future  Other vitamin B12 deficiency anemia- improvement noted -     CBC with Differential/Platelet; Future  Hypokalemia- her potassium level is normal now, will continue potassium replacement therapy -     Basic metabolic panel; Future  Onychomycosis of toenail- she is starting to develop normal nail growth, she has tolerated terbinafine well and her liver enzymes are normal, will continue for another month to eradicate the infection. -     Hepatic function panel; Future  Prediabetes- her A1c is down to 5.7%, improvement noted. -     Hemoglobin A1c; Future   I am having Ms. Daughdrill maintain her ibuprofen, cyanocobalamin, escitalopram, hydrochlorothiazide, mometasone, cetirizine, CONTRAVE, Diclofenac Sodium, and potassium chloride. We will stop administering cyanocobalamin. Additionally, we administered cyanocobalamin.  No orders of the defined types were placed in this encounter.    Follow-up: Return in about 6 months (around 11/20/2016).  Marcello Moores  Ronnald Ramp, MD

## 2016-06-30 ENCOUNTER — Ambulatory Visit: Payer: Federal, State, Local not specified - PPO

## 2016-06-30 ENCOUNTER — Encounter: Payer: Self-pay | Admitting: Obstetrics & Gynecology

## 2016-06-30 ENCOUNTER — Ambulatory Visit (INDEPENDENT_AMBULATORY_CARE_PROVIDER_SITE_OTHER): Payer: Federal, State, Local not specified - PPO | Admitting: Obstetrics & Gynecology

## 2016-06-30 VITALS — BP 122/78 | HR 53 | Wt 235.0 lb

## 2016-06-30 DIAGNOSIS — B9689 Other specified bacterial agents as the cause of diseases classified elsewhere: Secondary | ICD-10-CM

## 2016-06-30 DIAGNOSIS — N951 Menopausal and female climacteric states: Secondary | ICD-10-CM | POA: Diagnosis not present

## 2016-06-30 DIAGNOSIS — Z113 Encounter for screening for infections with a predominantly sexual mode of transmission: Secondary | ICD-10-CM

## 2016-06-30 DIAGNOSIS — Z124 Encounter for screening for malignant neoplasm of cervix: Secondary | ICD-10-CM | POA: Diagnosis not present

## 2016-06-30 DIAGNOSIS — N941 Unspecified dyspareunia: Secondary | ICD-10-CM

## 2016-06-30 DIAGNOSIS — Z01419 Encounter for gynecological examination (general) (routine) without abnormal findings: Secondary | ICD-10-CM | POA: Diagnosis not present

## 2016-06-30 DIAGNOSIS — Z1151 Encounter for screening for human papillomavirus (HPV): Secondary | ICD-10-CM | POA: Diagnosis not present

## 2016-06-30 DIAGNOSIS — R87610 Atypical squamous cells of undetermined significance on cytologic smear of cervix (ASC-US): Secondary | ICD-10-CM | POA: Diagnosis not present

## 2016-06-30 DIAGNOSIS — N760 Acute vaginitis: Secondary | ICD-10-CM

## 2016-06-30 MED ORDER — IBUPROFEN 800 MG PO TABS: 800.0000 mg | ORAL_TABLET | Freq: Three times a day (TID) | ORAL | 3 refills | Status: DC | PRN

## 2016-06-30 MED ORDER — GABAPENTIN 600 MG PO TABS: 600.0000 mg | ORAL_TABLET | Freq: Two times a day (BID) | ORAL | 3 refills | Status: DC

## 2016-06-30 NOTE — Progress Notes (Signed)
GYNECOLOGY VISIT NOTE  History:  50 y.o. [id number] here today for evaluation of dyspareunia for a few months.  She reports having pain during deep penetration, regardless of position. Pain is internal.  Also has pain during orgasm, feels like she is contracting.  Has used lots of lubrication, this has not helped with her pain.  She denies any abnormal vaginal discharge or bleeding.    Patient also reports having bothersome hot flashes and night sweats, also has not had a pap in over 2 years and desires one today.  Past Medical History:  Diagnosis Date  . Anemia   . Anxiety   . B12 deficiency   . Cough    non- productive  . Depression    no meds  . Hypertension   . Kidney stones    Past Surgical History:  Procedure Laterality Date  . caesarean section    . cystoscopic     extraction of ureteric calculus without disintegration  . DILITATION & CURRETTAGE/HYSTROSCOPY WITH NOVASURE ABLATION N/A 06/17/2013   Procedure: DILATATION & CURETTAGE/HYSTEROSCOPY WITH NOVASURE ABLATION;  Surgeon: Osborne Oman, MD;  Location: [facility];  Service: Gynecology;  Laterality: N/A;  . HEMORRHOIDECTOMY WITH HEMORRHOID BANDING  10/19/2012   SCA THD Hem ligation/pexy  . TUBAL LIGATION    . WISDOM TOOTH EXTRACTION     The following portions of the patient's history were reviewed and updated as appropriate: allergies, current medications, past family history, past medical history, past social history, past surgical history and problem list.   Health Maintenance:  Normal pap in 2014. Normal mammogram on 10/15/2015.   Review of Systems:  Pertinent items noted in HPI and remainder of comprehensive ROS otherwise negative.   Objective:  Physical Exam BP 122/78   Pulse (!) 53   Wt 235 lb (106.6 kg)   Breastfeeding? No   BMI 34.70 kg/m  CONSTITUTIONAL: Well-developed, well-nourished female in no acute distress.  HENT:  Normocephalic, atraumatic, External right and left ear normal. Oropharynx is clear  and moist EYES: Conjunctivae and EOM are normal. Pupils are equal, round, and reactive to light. No scleral icterus.  NECK: Normal range of motion, supple, no masses.  Normal thyroid.  SKIN: Skin is warm and dry. No rash noted. Not diaphoretic. No erythema. No pallor. NEUROLOGIC: Alert and oriented to person, place, and time. Normal reflexes, muscle tone coordination. No cranial nerve deficit noted. PSYCHIATRIC: Normal mood and affect. Normal behavior. Normal judgment and thought content. CARDIOVASCULAR: Normal heart rate noted, regular rhythm RESPIRATORY: Clear to auscultation bilaterally. Effort and breath sounds normal, no problems with respiration noted. BREASTS: Symmetric in size. No masses, skin changes, nipple drainage, or lymphadenopathy. ABDOMEN: Soft, normal bowel sounds, no distention noted.  No tenderness, rebound or guarding.  PELVIC: Normal appearing external genitalia; normal appearing, well-ruggated vaginal mucosa and cervix.  Normal appearing discharge.  Pap smear obtained.  Normal uterine size, no other palpable masses.  There was significant tenderness in the anterior vagina proximal to cervix, and also on movement of her cervix and uterus.   MUSCULOSKELETAL: Normal range of motion. No tenderness.  No cyanosis, clubbing, or edema.  2+ distal pulses.  Labs and Imaging No results found.  Assessment & Plan:   1. Dyspareunia in female Mostly in anterior vagina and upon movement of uterus.  Will evaluate for any structural anomaly.  Infection ancillary testing also done on the pap smear sample.  Patient was advised to use Ibuprofen prior to intercourse to see if this helps,  also prescribed Gabapentin to see if it will help with overall pelvic nerve pain (will also help with vasomotor symptoms - US Pelvis Complete; Future - US Transvaginal Non-OB; Future - Cytology - PAP - gabapentin (NEURONTIN) 600 MG tablet; Take 1 tablet (600 mg total) by mouth 2 (two) times daily. With  breakfast and at bedtime  Dispense: 30 tablet; Refill: 3 - ibuprofen (ADVIL,MOTRIN) 800 MG tablet; Take 1 tablet (800 mg total) by mouth 3 (three) times daily with meals as needed for headache or moderate pain.  Dispense: 30 tablet; Refill: 3  2. Perimenopausal vasomotor symptoms Gabapentin ordered. Will follow up response.  - gabapentin (NEURONTIN) 600 MG tablet; Take 1 tablet (600 mg total) by mouth 2 (two) times daily. With breakfast and at bedtime  Dispense: 30 tablet; Refill: 3  3. Encounter for gynecological examination with Papanicolaou smear of cervix - Cytology - PAP Routine preventative health maintenance measures emphasized. Please refer to After Visit Summary for other counseling recommendations.   Return in about 2 weeks (around 07/14/2016) for  follow up results.  Total face-to-face time with patient: 30 minutes. Over 50% of encounter was spent on counseling and coordination of care.   Verita Schneiders, MD, [place] Attending [place], [facility] for [employer], [place]

## 2016-06-30 NOTE — Patient Instructions (Signed)
Dyspareunia, Female Dyspareunia is pain that is associated with sexual activity. This can affect any part of the genitals or lower abdomen, and there are many possible causes. This condition ranges from mild to severe. Depending on the cause, dyspareunia may get better with treatment, or it may return (recur) over time. What are the causes? The cause of this condition is not always known. Possible causes include:  Cancer.  Psychological factors, such as depression, anxiety, or previous traumatic experiences.  Severe pain and tenderness of the skin around the vagina (vulva) when it is touched (vulvar vestibulitis syndrome).  Infection of the pelvis or the vulva.  Infection of the vagina.  Painful, involuntary tightening (contraction) of the vaginal muscles when anything is put inside the vagina (vaginismus).  Allergic reaction.  Ovarian cysts.  Solid growths of tissue (tumors) in the ovaries or the uterus.  Scar tissue in the ovaries, vagina, or pelvis.  Vaginal dryness.  Thinning of the tissue (atrophy) of the vulva and vagina.  Skin conditions that affect the vulva (vulvar dermatoses), such as lichen sclerosus or lichen planus.  Endometriosis.  Tubal pregnancy.  A tilted uterus.  Uterine prolapse.  Adhesions in the vagina.  Bladder problems.  Intestinal problems.  Certain medicines.  Medical conditions such as diabetes, arthritis, or thyroid disease. What increases the risk? The following factors may make you more likely to develop this condition:  Having experienced physical or sexual trauma.  Having given birth more than once.  Taking birth control pills.  Having gone through menopause.  Having recently given birth, typically within the past 3-6 months.  Breastfeeding. What are the signs or symptoms? The main symptom of this condition is pain in any part of the genitals or lower abdomen during or after sexual activity. This may include pain during  sexual arousal, genital stimulation, or orgasm. Pain may get worse when anything is inserted into the vagina, or when the genitals are touched in any way, such as when sitting or wearing pants. Pain can range from mild to severe, depending on the cause of the condition. In some cases, symptoms go away with treatment and return (recur) at a later date. How is this diagnosed? This condition may be diagnosed based on:  Your symptoms, including:  Where your pain is located.  When your pain occurs.  Your medical history.  A physical exam. This may include a pelvic exam and a Pap test. This is a screening test that is used to check for signs of cancer of the vagina, cervix, and uterus.  Tests, including:  Blood tests.  Ultrasound. This uses sound waves to make a picture of the area that is being tested.  Urine culture. This test involves checking a urine sample for signs of infection.  Culture test. This is when your health care provider uses a swab to collect a sample of vaginal fluid. The sample is checked for signs of infection.  X-rays.  MRI.  CT scan.  Laparoscopy. This is a procedure in which a small incision is made in your lower abdomen and a lighted, pencil-sized instrument (laparoscope) is passed through the incision and used to look inside your pelvis. You may be referred to a health care provider who specializes in women's health (gynecologist). In some cases, diagnosing the cause of dyspareunia can be difficult. How is this treated? Treatment depends on the cause of your condition and your symptoms. In most cases, you may need to stop sexual activity until your symptoms improve. Treatment may   include:  Lubricants.  Kegel exercises or vaginal dilators.  Medicated skin creams.  Medicated vaginal creams.  Hormonal therapy.  Antibiotic medicine to prevent or fight infection.  Medicines that help to relieve pain.  Medicines that treat depression  (antidepressants).  Psychological counseling.  Sex therapy.  Surgery. Follow these instructions at home: Lifestyle   Avoid tight clothing and irritating materials around your genital and abdominal area.  Use water-based lubricants as needed. Avoid oil-based lubricants.  Do not use any products that irritate you. This may include certain condoms, spermicides, lubricants, soaps, tampons, vaginal sprays, or douches.  Always practice safe sex. Talk with your health care provider about which form of birth control (contraception) is best for you.  Maintain open communication with your sexual partner. General instructions   Take over-the-counter and prescription medicines only as told by your health care provider.  If you had tests done, it is your responsibility to get your tests results. Ask your health care provider or the department performing the test when your results will be ready.  Urinate before you engage in sexual activity.  Consider joining a support group.  Keep all follow-up visits as told by your health care provider. This is important. Contact a health care provider if:  You develop vaginal bleeding after sexual intercourse.  You develop a lump at the opening of your vagina. Seek medical care even if the lump is painless.  You have:  Abnormal vaginal discharge.  Vaginal dryness.  Itchiness or irritation of your vulva or vagina.  A new rash.  Symptoms that get worse or do not improve with treatment.  A fever.  Pain when you urinate.  Blood in your urine. Get help right away if:  You develop severe pain in your abdomen during or shortly after sexual intercourse.  You pass out after having sexual intercourse. This information is not intended to replace advice given to you by your health care provider. Make sure you discuss any questions you have with your health care provider. Document Released: 08/10/2007 Document Revised: 11/30/2015 Document  Reviewed: 02/20/2015 Elsevier Interactive Patient Education  2017 Elsevier Inc.  

## 2016-07-01 ENCOUNTER — Ambulatory Visit (INDEPENDENT_AMBULATORY_CARE_PROVIDER_SITE_OTHER): Payer: Federal, State, Local not specified - PPO | Admitting: General Practice

## 2016-07-01 ENCOUNTER — Encounter: Payer: Self-pay | Admitting: Internal Medicine

## 2016-07-01 DIAGNOSIS — E538 Deficiency of other specified B group vitamins: Secondary | ICD-10-CM

## 2016-07-01 MED ORDER — CYANOCOBALAMIN 1000 MCG/ML IJ SOLN
1000.0000 ug | Freq: Once | INTRAMUSCULAR | Status: AC
  Administered 2016-07-01: 1000 ug via INTRAMUSCULAR

## 2016-07-02 ENCOUNTER — Encounter: Payer: Self-pay | Admitting: Internal Medicine

## 2016-07-02 ENCOUNTER — Telehealth: Payer: Self-pay

## 2016-07-02 ENCOUNTER — Ambulatory Visit (INDEPENDENT_AMBULATORY_CARE_PROVIDER_SITE_OTHER): Payer: Federal, State, Local not specified - PPO | Admitting: Internal Medicine

## 2016-07-02 DIAGNOSIS — I10 Essential (primary) hypertension: Secondary | ICD-10-CM

## 2016-07-02 DIAGNOSIS — J069 Acute upper respiratory infection, unspecified: Secondary | ICD-10-CM | POA: Diagnosis not present

## 2016-07-02 LAB — HM PAP SMEAR

## 2016-07-02 MED ORDER — AZITHROMYCIN 250 MG PO TABS: ORAL_TABLET | ORAL | 1 refills | Status: DC

## 2016-07-02 MED ORDER — HYDROCODONE-HOMATROPINE 5-1.5 MG/5ML PO SYRP: 5.0000 mL | ORAL_SOLUTION | Freq: Four times a day (QID) | ORAL | 0 refills | Status: AC | PRN

## 2016-07-02 NOTE — Progress Notes (Signed)
Subjective:    Patient ID: GAGANDEEP DEKLE, female    DOB: 19-Aug-1965, 50 y.o.   MRN: [id number]  HPI  Here with 2-3 days acute onset fever, facial pain, pressure, headache, general weakness and malaise, and greenish d/c, with mild ST and cough, but pt denies chest pain, wheezing, increased sob or doe, orthopnea, PND, increased LE swelling, palpitations, dizziness or syncope. No other new history Past Medical History:  Diagnosis Date  . Anemia   . Anxiety   . B12 deficiency   . Cough    non- productive  . Depression    no meds  . Hypertension   . Kidney stones    Past Surgical History:  Procedure Laterality Date  . caesarean section    . cystoscopic     extraction of ureteric calculus without disintegration  . DILITATION & CURRETTAGE/HYSTROSCOPY WITH NOVASURE ABLATION N/A 06/17/2013   Procedure: DILATATION & CURETTAGE/HYSTEROSCOPY WITH NOVASURE ABLATION;  Surgeon: Osborne Oman, MD;  Location: [facility];  Service: Gynecology;  Laterality: N/A;  . HEMORRHOIDECTOMY WITH HEMORRHOID BANDING  10/19/2012   SCA THD Hem ligation/pexy  . TUBAL LIGATION    . WISDOM TOOTH EXTRACTION      reports that she quit smoking about 16 years ago. Her smoking use included Cigarettes. She has never used smokeless tobacco. She reports that she drinks alcohol. She reports that she does not use drugs. family history includes Anemia in her sister; Asthma in her mother; Cancer (age of onset: 37) in her mother; Deep vein thrombosis in her sister; Diabetes in her sister; Heart disease in her paternal grandfather; Hypertension in her mother; Hypotension in her sister; Hypothyroidism in her mother, sister, and sister; Seizures in her brother and sister; Stroke in her maternal uncle. Allergies  Allergen Reactions  . Naproxen     Panic attacks   Current Outpatient Prescriptions on File Prior to Visit  Medication Sig Dispense Refill  . albuterol (PROVENTIL HFA;VENTOLIN HFA) 108 (90 Base) MCG/ACT inhaler Inhale  into the lungs.    . cetirizine (ZYRTEC) 10 MG tablet Take 1 tablet (10 mg total) by mouth daily. 30 tablet 11  . CONTRAVE 8-90 MG TB12 TAKE TWO TABLETS BY MOUTH TWICE DAILY 120 tablet 11  . cyanocobalamin (,VITAMIN B-12,) 1000 MCG/ML injection Inject 1,000 mcg into the muscle every 14 (fourteen) days.    . Diclofenac Sodium (PENNSAID) 2 % SOLN Place 2 application onto the skin 2 (two) times daily. 112 g 3  . escitalopram (LEXAPRO) 10 MG tablet     . gabapentin (NEURONTIN) 600 MG tablet Take 1 tablet (600 mg total) by mouth 2 (two) times daily. With breakfast and at bedtime 30 tablet 3  . hydrochlorothiazide (HYDRODIURIL) 25 MG tablet TAKE ONE TABLET BY MOUTH ONCE DAILY 90 tablet 1  . ibuprofen (ADVIL,MOTRIN) 800 MG tablet Take 1 tablet (800 mg total) by mouth 3 (three) times daily with meals as needed for headache or moderate pain. 30 tablet 3  . mometasone (NASONEX) 50 MCG/ACT nasal spray Place 4 sprays into the nose daily. 17 g 12  . potassium chloride (K-DUR,KLOR-CON) 10 MEQ tablet TAKE ONE TABLET BY MOUTH TWICE DAILY 180 tablet 1  . terbinafine (LAMISIL) 250 MG tablet TAKE ONE TABLET BY MOUTH ONCE DAILY 30 tablet 1  . metroNIDAZOLE (FLAGYL) 500 MG tablet Take 1 tablet (500 mg total) by mouth 2 (two) times daily. 14 tablet 0  . [DISCONTINUED] potassium chloride (K-DUR) 10 MEQ tablet Take 1 tablet (10 mEq total)  by mouth 2 (two) times daily. 60 tablet 11   No current facility-administered medications on file prior to visit.    Review of Systems All otherwise neg per pt     Objective:   Physical Exam BP 136/70   Pulse 86   Temp 98.2 F (36.8 C) (Oral)   Resp 20   Wt 241 lb (109.3 kg)   SpO2 97%   BMI 35.59 kg/m  VS noted, mild ill Constitutional: Pt appears in no apparent distress HENT: Head: NCAT.  Right Ear: External ear normal.  Left Ear: External ear normal.  Eyes: . Pupils are equal, round, and reactive to light. Conjunctivae and EOM are normal Bilat tm's with mild  erythema.  Max sinus areas mild tender.  Pharynx with mild erythema, no exudate Neck: Normal range of motion. Neck supple.  Cardiovascular: Normal rate and regular rhythm.   Pulmonary/Chest: Effort normal and breath sounds without rales or wheezing.  Neurological: Pt is alert. Not confused , motor grossly intact Skin: Skin is warm. No rash, no LE edema Psychiatric: Pt behavior is normal. No agitation.     Assessment & Plan:

## 2016-07-02 NOTE — Progress Notes (Signed)
Letter done

## 2016-07-02 NOTE — Progress Notes (Signed)
Pre visit review using our clinic review tool, if applicable. No additional management support is needed unless otherwise documented below in the visit note. 

## 2016-07-02 NOTE — Telephone Encounter (Signed)
Med sent to new pharmacy

## 2016-07-02 NOTE — Patient Instructions (Signed)
Please take all new medication as prescribed - the antibiotic, and cough medicine as needed  Please continue all other medications as before, and refills have been done if requested.  Please have the pharmacy call with any other refills you may need.  Please keep your appointments with your specialists as you may have planned   

## 2016-07-03 ENCOUNTER — Encounter: Payer: Self-pay | Admitting: Internal Medicine

## 2016-07-04 ENCOUNTER — Ambulatory Visit (HOSPITAL_COMMUNITY)
Admission: RE | Admit: 2016-07-04 | Discharge: 2016-07-04 | Disposition: A | Discharge status: Home / Self Care | Payer: Federal, State, Local not specified - PPO | Source: Ambulatory Visit | Attending: Obstetrics & Gynecology | Admitting: Obstetrics & Gynecology

## 2016-07-04 ENCOUNTER — Ambulatory Visit (HOSPITAL_COMMUNITY): Payer: Federal, State, Local not specified - PPO

## 2016-07-04 DIAGNOSIS — N941 Unspecified dyspareunia: Secondary | ICD-10-CM | POA: Insufficient documentation

## 2016-07-04 DIAGNOSIS — D259 Leiomyoma of uterus, unspecified: Secondary | ICD-10-CM | POA: Diagnosis not present

## 2016-07-04 LAB — CERVICOVAGINAL ANCILLARY ONLY: Candida vaginitis: NEGATIVE

## 2016-07-04 MED ORDER — METRONIDAZOLE 500 MG PO TABS: 500.0000 mg | ORAL_TABLET | Freq: Two times a day (BID) | ORAL | 0 refills | Status: DC

## 2016-07-04 NOTE — Addendum Note (Signed)
Addended by: Verita Schneiders A on: 07/04/2016 12:20 PM   Modules accepted: Orders

## 2016-07-06 NOTE — Assessment & Plan Note (Signed)
stable overall by history and exam, recent data reviewed with pt, and pt to continue medical treatment as before,  to f/u any worsening symptoms or concerns BP Readings from Last 3 Encounters:  07/02/16 136/70  06/30/16 122/78  05/22/16 126/88

## 2016-07-06 NOTE — Assessment & Plan Note (Signed)
Mild to mod, for antibx course,  to f/u any worsening symptoms or concerns 

## 2016-07-07 ENCOUNTER — Encounter: Payer: Self-pay | Admitting: Obstetrics & Gynecology

## 2016-07-07 LAB — CYTOLOGY - PAP
CHLAMYDIA, DNA PROBE: NEGATIVE
Diagnosis: UNDETERMINED — AB
HPV: NOT DETECTED
NEISSERIA GONORRHEA: NEGATIVE
Trichomonas: NEGATIVE

## 2016-07-15 ENCOUNTER — Ambulatory Visit: Payer: Federal, State, Local not specified - PPO | Admitting: Obstetrics & Gynecology

## 2016-08-07 ENCOUNTER — Ambulatory Visit (INDEPENDENT_AMBULATORY_CARE_PROVIDER_SITE_OTHER): Payer: Federal, State, Local not specified - PPO | Admitting: General Practice

## 2016-08-07 DIAGNOSIS — E538 Deficiency of other specified B group vitamins: Secondary | ICD-10-CM | POA: Diagnosis not present

## 2016-08-07 MED ORDER — CYANOCOBALAMIN 1000 MCG/ML IJ SOLN
1000.0000 ug | Freq: Once | INTRAMUSCULAR | Status: AC
  Administered 2016-08-07: 1000 ug via INTRAMUSCULAR

## 2016-08-11 ENCOUNTER — Ambulatory Visit: Payer: Federal, State, Local not specified - PPO | Admitting: Obstetrics & Gynecology

## 2016-08-24 ENCOUNTER — Other Ambulatory Visit: Payer: Self-pay | Admitting: Internal Medicine

## 2016-08-26 ENCOUNTER — Ambulatory Visit: Payer: Federal, State, Local not specified - PPO

## 2016-08-27 ENCOUNTER — Ambulatory Visit (INDEPENDENT_AMBULATORY_CARE_PROVIDER_SITE_OTHER): Payer: Federal, State, Local not specified - PPO

## 2016-08-27 ENCOUNTER — Telehealth: Payer: Self-pay

## 2016-08-27 ENCOUNTER — Other Ambulatory Visit (INDEPENDENT_AMBULATORY_CARE_PROVIDER_SITE_OTHER): Payer: Federal, State, Local not specified - PPO

## 2016-08-27 DIAGNOSIS — E538 Deficiency of other specified B group vitamins: Secondary | ICD-10-CM | POA: Diagnosis not present

## 2016-08-27 LAB — VITAMIN B12

## 2016-08-27 MED ORDER — CYANOCOBALAMIN 1000 MCG/ML IJ SOLN
1000.0000 ug | Freq: Once | INTRAMUSCULAR | Status: AC
  Administered 2016-08-27: 1000 ug via INTRAMUSCULAR

## 2016-08-27 NOTE — Telephone Encounter (Signed)
Per dr Ronnald Ramp, b12 injection should be given today---and have labs checked today or as soon as patient can come back for lab----

## 2016-08-27 NOTE — Telephone Encounter (Signed)
error 

## 2016-09-10 ENCOUNTER — Telehealth: Payer: Self-pay

## 2016-09-10 ENCOUNTER — Ambulatory Visit: Payer: Federal, State, Local not specified - PPO

## 2016-09-10 NOTE — Telephone Encounter (Signed)
Per dr Ronnald Ramp, patient should start getting b12 injections once monthly----recent lab on 1/24 is at 1500----can recheck lab in appx 6 months

## 2016-09-22 ENCOUNTER — Ambulatory Visit (INDEPENDENT_AMBULATORY_CARE_PROVIDER_SITE_OTHER): Payer: Federal, State, Local not specified - PPO | Admitting: Sports Medicine

## 2016-09-22 ENCOUNTER — Ambulatory Visit: Payer: Self-pay

## 2016-09-22 ENCOUNTER — Ambulatory Visit (INDEPENDENT_AMBULATORY_CARE_PROVIDER_SITE_OTHER): Payer: Federal, State, Local not specified - PPO

## 2016-09-22 ENCOUNTER — Encounter: Payer: Self-pay | Admitting: Sports Medicine

## 2016-09-22 VITALS — BP 128/88 | HR 80 | Ht 69.0 in | Wt 243.6 lb

## 2016-09-22 DIAGNOSIS — M1811 Unilateral primary osteoarthritis of first carpometacarpal joint, right hand: Secondary | ICD-10-CM | POA: Insufficient documentation

## 2016-09-22 DIAGNOSIS — M25531 Pain in right wrist: Secondary | ICD-10-CM

## 2016-09-22 NOTE — Progress Notes (Signed)
Mary Sanford - 51 y.o. female MRN [id number]  Date of birth: 01/04/1966  Office Visit Note: Visit Date: 09/22/2016 PCP: Scarlette Calico, MD Referred by: [name], MD  Subjective: Chief Complaint  Patient presents with  . right hand pain    Pt. c/o pain in right hand x 2 weeks. Pain is constant and about an 8.5 on pain scale of 1-10. There is no tingling or numbness. She does not recall any injury to the hand. She has tried hear and Ibuprofen with minimal relief.    HPI: She reports worsening severe right wrist and thumb pain over the past 2 weeks to the point that it is waking her up at night.  She has tried ibuprofen, heat, ice and a left hand thumb spica soft brace with no significant improvement.  She denies any known trauma.  She does use a keyboard and mouse frequently at work but no significant changes in her activity level. ROS: She denies any significant numbness or tingling.  This does seem similar to when she had issues on the left although slightly different.  Otherwise per HPI.  Objective:  VS:  HT:5\' 9"  (175.3 cm)   WT:243 lb 9.6 oz (110.5 kg)  BMI:36    BP:128/88  HR:80bpm  TEMP: ( )  RESP:95 % Physical Exam: GENERAL: WDWN, NAD, Non-toxic appearing  PSYCH: Alert & Appropriately interactive Not depressed or anxious appearing  UPPER EXTREMITIES: SKIN: No significant rashes/lesions/ulcerations.  CV: No significant pitting edema. No clubbing or cyanosis.  NEURO: Sensation is intact to light touch Gross motor coordination is normal   MSK Hypopigmentation of left wrist  R Wrist  overall well aligned.  She does have a small amount of swelling MCPs.  As well as generalized swelling on the carpal rows.  Negative Finkelstein.  Marked pain with palpation of the MCP.     Imaging & Procedures: Dg Wrist Complete Right  Result Date: 09/22/2016 CLINICAL DATA:  Wrist pain.  No injury. EXAM: RIGHT WRIST - COMPLETE 3+ VIEW COMPARISON:  No recent prior FINDINGS: No acute  bony or joint abnormality. No evidence of fracture dislocation. IMPRESSION: No acute abnormality. Electronically Signed   By: Marcello Moores  Register   On: 09/22/2016 09:52  My read: She does have a small amount of osteophytic spurring within the first [facility].  Slight flattening as well.  Impression early osteoarthritis  PROCEDURE NOTE: ultrasound guided right thumb CMC injection  The patients clinical condition is marked by substantial pain and/or significant functional disability. Other conservative therapy has not provided relief, is contraindicated, or not appropriate. There is a reasonable likelihood that injection will significantly improve the patients pain and/or functional impairment.  After discussing the risks, benefits and expected outcomes of the injection and all questions were reviewed and answered, the patient wished to undergo the above named procedure.  Verbal consent was obtained. The ultrasound was used to identify the target structure and adjacent neurovascular structures. The skin was then prepped in sterile fashion and the target structure was injected under direct visualization in sterile fashion as below: PREP: Alcohol, Ethel Chloride APPROACH: Direct, single injection, 25G 1.5" needle INJECTATE: 0.5 cc of 1% lidocaine, 0.5 cc of 0.5% Marcaine, 0.5 cc of 40 mg ASPIRATE: N/A IMAGES: Obtained and stored DRESSING: Band-Aid Post procedural instructions including recommending icing and warning signs for infection were reviewed.  This procedure was well tolerated and there were no complications.     Assessment & Plan: Problem List Items Addressed This Visit  Arthritis of carpometacarpal [facility]) joint of right thumb    Symptoms most consistent today with [facility] Arthur and responded quite well to injection.  I will have her continue using Pennsaid as needed and have provided her with a new thumb spica splint.  Recommend intermittent soaking and reevaluation if any lack of improvement.        Other Visit Diagnoses    Right wrist pain    -  Primary   Relevant Orders   DG Wrist Complete Right (Completed)   [place] LIMITED JOINT SPACE STRUCTURES UP RIGHT     Follow-up: Return if symptoms worsen or fail to improve.   Past Medical/Family/Surgical/Social History: Medications & Allergies reviewed per EMR Patient Active Problem List   Diagnosis Date Noted  . Arthritis of carpometacarpal [facility]) joint of right thumb 09/22/2016  . Acute upper respiratory infection 07/02/2016  . ASCUS pap with negative high risk HPV on 06/30/16 06/30/2016  . Arthritis of carpometacarpal [facility]) joint of left thumb 05/06/2016  . Onychomycosis of toenail 01/15/2016  . Anxiety and depression 12/28/2015  . De Quervain's tenosynovitis, left 06/21/2015  . Prediabetes 11/02/2014  . Anemia, iron deficiency 09/17/2013  . Allergic rhinitis 01/12/2013  . Constipation, chronic 10/04/2012  . Hypokalemia 05/13/2012  . B12 deficiency anemia 01/09/2012  . Obesity (BMI 30-39.9) 01/09/2012  . Essential hypertension, benign 11/24/2011   Past Medical History:  Diagnosis Date  . Anemia   . Anxiety   . B12 deficiency   . Cough    non- productive  . Depression    no meds  . Hypertension   . Kidney stones    Family History  Problem Relation Age of Onset  . Hypertension Mother   . Hypothyroidism Mother   . Asthma Mother   . Cancer Mother 18    Colon and rectal  . Hypotension Sister   . Hypothyroidism Sister   . Diabetes Sister   . Seizures Brother   . Hypothyroidism Sister   . Seizures Sister   . Anemia Sister   . Asthma    . Hypothyroidism    . Heart disease Paternal Grandfather     >55  . Stroke Maternal Uncle     > 55  . Deep vein thrombosis Sister   . Early death Neg Hx   . Hyperlipidemia Neg Hx   . Kidney disease Neg Hx    Past Surgical History:  Procedure Laterality Date  . caesarean section    . cystoscopic     extraction of ureteric calculus without disintegration  . DILITATION &  CURRETTAGE/HYSTROSCOPY WITH NOVASURE ABLATION N/A 06/17/2013   Procedure: DILATATION & CURETTAGE/HYSTEROSCOPY WITH NOVASURE ABLATION;  Surgeon: Osborne Oman, MD;  Location: [facility];  Service: Gynecology;  Laterality: N/A;  . HEMORRHOIDECTOMY WITH HEMORRHOID BANDING  10/19/2012   SCA THD Hem ligation/pexy  . TUBAL LIGATION    . WISDOM TOOTH EXTRACTION     Social History   Occupational History  . minister    Social History Main Topics  . Smoking status: Former Smoker    Types: Cigarettes    Quit date: 08/05/1999  . Smokeless tobacco: Never Used  . Alcohol use Yes  . Drug use: No  . Sexual activity: Not on file

## 2016-09-22 NOTE — Patient Instructions (Signed)
Where your brace on a nightly basis.    I anticipate you feeling better over the next 2-3 days.  If you are not improved in 2 weeks please return for reevaluation.  Continue to use your Pennsaid on a daily basis for the next 2-3 days then as needed.

## 2016-09-22 NOTE — Assessment & Plan Note (Signed)
Symptoms most consistent today with [facility] Arthur and responded quite well to injection.  I will have her continue using Pennsaid as needed and have provided her with a new thumb spica splint.  Recommend intermittent soaking and reevaluation if any lack of improvement.

## 2016-09-23 ENCOUNTER — Other Ambulatory Visit: Payer: Self-pay | Admitting: Internal Medicine

## 2016-09-23 DIAGNOSIS — Z1231 Encounter for screening mammogram for malignant neoplasm of breast: Secondary | ICD-10-CM

## 2016-09-24 ENCOUNTER — Ambulatory Visit: Payer: Federal, State, Local not specified - PPO | Admitting: Family Medicine

## 2016-10-22 ENCOUNTER — Inpatient Hospital Stay: Admission: RE | Admit: 2016-10-22 | Payer: Federal, State, Local not specified - PPO | Source: Ambulatory Visit

## 2016-10-24 ENCOUNTER — Encounter: Payer: Self-pay | Admitting: Nurse Practitioner

## 2016-10-24 ENCOUNTER — Ambulatory Visit (INDEPENDENT_AMBULATORY_CARE_PROVIDER_SITE_OTHER): Payer: Federal, State, Local not specified - PPO | Admitting: Nurse Practitioner

## 2016-10-24 VITALS — BP 156/104 | HR 80 | Temp 98.6°F | Ht 69.0 in | Wt 246.0 lb

## 2016-10-24 DIAGNOSIS — J301 Allergic rhinitis due to pollen: Secondary | ICD-10-CM | POA: Diagnosis not present

## 2016-10-24 DIAGNOSIS — J069 Acute upper respiratory infection, unspecified: Secondary | ICD-10-CM | POA: Diagnosis not present

## 2016-10-24 DIAGNOSIS — H6980 Other specified disorders of Eustachian tube, unspecified ear: Secondary | ICD-10-CM

## 2016-10-24 MED ORDER — DM-GUAIFENESIN ER 30-600 MG PO TB12: 1.0000 | ORAL_TABLET | Freq: Two times a day (BID) | ORAL | 0 refills | Status: DC | PRN

## 2016-10-24 MED ORDER — MOMETASONE FUROATE 50 MCG/ACT NA SUSP: 4.0000 | Freq: Every day | NASAL | 0 refills | Status: DC

## 2016-10-24 MED ORDER — CEFUROXIME AXETIL 250 MG PO TABS: 250.0000 mg | ORAL_TABLET | Freq: Two times a day (BID) | ORAL | 0 refills | Status: DC

## 2016-10-24 MED ORDER — ALBUTEROL SULFATE HFA 108 (90 BASE) MCG/ACT IN AERS: 1.0000 | INHALATION_SPRAY | Freq: Four times a day (QID) | RESPIRATORY_TRACT | 0 refills | Status: DC | PRN

## 2016-10-24 MED ORDER — OXYMETAZOLINE HCL 0.05 % NA SOLN: 1.0000 | Freq: Two times a day (BID) | NASAL | 0 refills | Status: DC

## 2016-10-24 NOTE — Progress Notes (Signed)
Pre visit review using our clinic review tool, if applicable. No additional management support is needed unless otherwise documented below in the visit note. 

## 2016-10-24 NOTE — Patient Instructions (Addendum)
URI Instructions: nasonex and Afrin use: apply 1spray of afrin in each nare, wait 47mins, then apply 2sprays of nasonex in each nare. Use both nasal spray consecutively x 3days, then flonase only for at least 14days.  Encourage adequate oral hydration.  Use over-the-counter  "cold" medicines  such as "Tylenol cold" , "Advil cold",  "Mucinex" or" Mucinex D"  for cough and congestion.  Avoid decongestants if you have high blood pressure. Use" Delsym" or" Robitussin" cough syrup varietis for cough.  You can use plain "Tylenol" or "Advi"l for fever, chills and achyness.  Start cefuroxime if no improvement in 3days.  May also use hycodan as prescribed

## 2016-10-24 NOTE — Progress Notes (Signed)
Subjective:  Patient ID: Mary Sanford, female    DOB: 1965-10-26  Age: 51 y.o. MRN: [id number]  CC: Cough (coughing,sneezing,running nouse,sinus pressure--back pain for 1 week. B12 inj?)   URI   This is a new problem. The current episode started in the past 7 days. The problem has been unchanged. There has been no fever. Associated symptoms include congestion, coughing, ear pain, a plugged ear sensation, rhinorrhea, sinus pain, sneezing and a sore throat. She has tried nothing for the symptoms.    Outpatient Medications Prior to Visit  Medication Sig Dispense Refill  . CONTRAVE 8-90 MG TB12 TAKE TWO TABLETS BY MOUTH TWICE DAILY 120 tablet 11  . cyanocobalamin (,VITAMIN B-12,) 1000 MCG/ML injection Inject 1,000 mcg into the muscle every 14 (fourteen) days.    . Diclofenac Sodium (PENNSAID) 2 % SOLN Place 2 application onto the skin 2 (two) times daily. 112 g 3  . escitalopram (LEXAPRO) 10 MG tablet     . hydrochlorothiazide (HYDRODIURIL) 25 MG tablet TAKE ONE TABLET BY MOUTH ONCE DAILY 90 tablet 1  . ibuprofen (ADVIL,MOTRIN) 800 MG tablet Take 1 tablet (800 mg total) by mouth 3 (three) times daily with meals as needed for headache or moderate pain. 30 tablet 3  . potassium chloride (K-DUR,KLOR-CON) 10 MEQ tablet TAKE ONE TABLET BY MOUTH TWICE DAILY 180 tablet 1  . albuterol (PROVENTIL HFA;VENTOLIN HFA) 108 (90 Base) MCG/ACT inhaler Inhale into the lungs.    . mometasone (NASONEX) 50 MCG/ACT nasal spray Place 4 sprays into the nose daily. 17 g 12   No facility-administered medications prior to visit.     ROS See HPI  Objective:  BP (!) 156/104   Pulse 80   Temp 98.6 F (37 C)   Ht 5\' 9"  (1.753 m)   Wt 246 lb (111.6 kg)   SpO2 98%   BMI 36.33 kg/m   BP Readings from Last 3 Encounters:  10/24/16 (!) 156/104  09/22/16 128/88  07/02/16 136/70    Wt Readings from Last 3 Encounters:  10/24/16 246 lb (111.6 kg)  09/22/16 243 lb 9.6 oz (110.5 kg)  07/02/16 241 lb (109.3 kg)     Physical Exam  Constitutional: She is oriented to person, place, and time.  HENT:  Right Ear: External ear and ear canal normal. Tympanic membrane is not injected and not erythematous. A middle ear effusion is present.  Left Ear: External ear and ear canal normal. Tympanic membrane is not injected and not erythematous. A middle ear effusion is present.  Nose: Mucosal edema and rhinorrhea present. Right sinus exhibits maxillary sinus tenderness. Right sinus exhibits no frontal sinus tenderness. Left sinus exhibits maxillary sinus tenderness. Left sinus exhibits no frontal sinus tenderness.  Mouth/Throat: Uvula is midline. No trismus in the jaw. Posterior oropharyngeal erythema present. No oropharyngeal exudate.  Eyes: No scleral icterus.  Neck: Normal range of motion. Neck supple.  Cardiovascular: Normal rate and normal heart sounds.   Pulmonary/Chest: Effort normal and breath sounds normal.  Musculoskeletal: She exhibits no edema.  Lymphadenopathy:    She has no cervical adenopathy.  Neurological: She is alert and oriented to person, place, and time.  Vitals reviewed.   Lab Results  Component Value Date   WBC 5.5 05/22/2016   HGB 13.6 05/22/2016   HCT 42.6 05/22/2016   PLT 256.0 05/22/2016   GLUCOSE 92 05/22/2016   CHOL 141 04/17/2015   TRIG 177.0 (H) 04/17/2015   HDL 45.70 04/17/2015   LDLCALC 59 04/17/2015  ALT 19 05/22/2016   AST 13 05/22/2016   NA 141 05/22/2016   K 4.2 05/22/2016   CL 104 05/22/2016   CREATININE 0.87 05/22/2016   BUN 10 05/22/2016   CO2 31 05/22/2016   TSH 0.85 12/28/2015   HGBA1C 5.7 05/22/2016    US Transvaginal Non-ob  Result Date: 07/05/2016 CLINICAL DATA:  Chronic dyspareunia.  Initial encounter. EXAM: TRANSABDOMINAL AND TRANSVAGINAL ULTRASOUND OF PELVIS TECHNIQUE: Both transabdominal and transvaginal ultrasound examinations of the pelvis were performed. Transabdominal technique was performed for global imaging of the pelvis including  uterus, ovaries, adnexal regions, and pelvic cul-de-sac. It was necessary to proceed with endovaginal exam following the transabdominal exam to visualize the endometrium and ovaries in greater detail. COMPARISON:  Pelvic ultrasound performed 02/28/2013 FINDINGS: Uterus Measurements: 8.8 x 4.8 x 6.5 cm. A 1.8 cm myometrial fibroid is noted at the left uterine fundus. Endometrium Thickness: 0.4 cm. No focal abnormality visualized, though difficult to fully characterize. Right ovary Measurements: 3.1 x 1.6 x 3.1 cm. Normal appearance/no adnexal mass. Left ovary Not visualized. Prominent venous structures are noted at the left adnexa. Other findings Trace free fluid is noted within the pelvic cul-de-sac. IMPRESSION: 1. Small uterine fibroid noted.  Uterus otherwise unremarkable. 2. Left ovary not visualized. Right ovary unremarkable in appearance. 3. Prominent venous structures at the left adnexa raise question for pelvic congestion syndrome in the appropriate clinical situation. Electronically Signed   By: Garald Balding M.D.   On: 07/05/2016 05:23   US Pelvis Complete  Result Date: 07/05/2016 CLINICAL DATA:  Chronic dyspareunia.  Initial encounter. EXAM: TRANSABDOMINAL AND TRANSVAGINAL ULTRASOUND OF PELVIS TECHNIQUE: Both transabdominal and transvaginal ultrasound examinations of the pelvis were performed. Transabdominal technique was performed for global imaging of the pelvis including uterus, ovaries, adnexal regions, and pelvic cul-de-sac. It was necessary to proceed with endovaginal exam following the transabdominal exam to visualize the endometrium and ovaries in greater detail. COMPARISON:  Pelvic ultrasound performed 02/28/2013 FINDINGS: Uterus Measurements: 8.8 x 4.8 x 6.5 cm. A 1.8 cm myometrial fibroid is noted at the left uterine fundus. Endometrium Thickness: 0.4 cm. No focal abnormality visualized, though difficult to fully characterize. Right ovary Measurements: 3.1 x 1.6 x 3.1 cm. Normal  appearance/no adnexal mass. Left ovary Not visualized. Prominent venous structures are noted at the left adnexa. Other findings Trace free fluid is noted within the pelvic cul-de-sac. IMPRESSION: 1. Small uterine fibroid noted.  Uterus otherwise unremarkable. 2. Left ovary not visualized. Right ovary unremarkable in appearance. 3. Prominent venous structures at the left adnexa raise question for pelvic congestion syndrome in the appropriate clinical situation. Electronically Signed   By: Garald Balding M.D.   On: 07/05/2016 05:23    Assessment & Plan:   Mary Sanford was seen today for cough.  Diagnoses and all orders for this visit:  Acute upper respiratory infection -     oxymetazoline (AFRIN NASAL SPRAY) 0.05 % nasal spray; Place 1 spray into both nostrils 2 (two) times daily. Use only for 3days, then stop -     dextromethorphan-guaiFENesin (MUCINEX DM) 30-600 MG 12hr tablet; Take 1 tablet by mouth 2 (two) times daily as needed for cough. -     cefUROXime (CEFTIN) 250 MG tablet; Take 1 tablet (250 mg total) by mouth 2 (two) times daily with a meal. -     albuterol (PROVENTIL HFA;VENTOLIN HFA) 108 (90 Base) MCG/ACT inhaler; Inhale 1-2 puffs into the lungs every 6 (six) hours as needed for wheezing or shortness of breath.  Chronic allergic rhinitis due to pollen, unspecified seasonality -     mometasone (NASONEX) 50 MCG/ACT nasal spray; Place 4 sprays into the nose daily.  Eustachian tube dysfunction, unspecified laterality -     mometasone (NASONEX) 50 MCG/ACT nasal spray; Place 4 sprays into the nose daily.   I have changed Mary Sanford's albuterol. I am also having her start on oxymetazoline, dextromethorphan-guaiFENesin, and cefUROXime. Additionally, I am having her maintain her cyanocobalamin, CONTRAVE, Diclofenac Sodium, potassium chloride, escitalopram, ibuprofen, hydrochlorothiazide, and mometasone.  Meds ordered this encounter  Medications  . oxymetazoline (AFRIN NASAL SPRAY) 0.05 % nasal  spray    Sig: Place 1 spray into both nostrils 2 (two) times daily. Use only for 3days, then stop    Dispense:  30 mL    Refill:  0    Order Specific Question:   Supervising Provider    Answer:   Cassandria Anger [[id number]]  . dextromethorphan-guaiFENesin (MUCINEX DM) 30-600 MG 12hr tablet    Sig: Take 1 tablet by mouth 2 (two) times daily as needed for cough.    Dispense:  14 tablet    Refill:  0    Order Specific Question:   Supervising Provider    Answer:   Cassandria Anger [[id number]]  . cefUROXime (CEFTIN) 250 MG tablet    Sig: Take 1 tablet (250 mg total) by mouth 2 (two) times daily with a meal.    Dispense:  14 tablet    Refill:  0    Order Specific Question:   Supervising Provider    Answer:   Cassandria Anger [[id number]]  . mometasone (NASONEX) 50 MCG/ACT nasal spray    Sig: Place 4 sprays into the nose daily.    Dispense:  17 g    Refill:  0    Order Specific Question:   Supervising Provider    Answer:   Cassandria Anger [[id number]]  . albuterol (PROVENTIL HFA;VENTOLIN HFA) 108 (90 Base) MCG/ACT inhaler    Sig: Inhale 1-2 puffs into the lungs every 6 (six) hours as needed for wheezing or shortness of breath.    Dispense:  1 Inhaler    Refill:  0    Order Specific Question:   Supervising Provider    Answer:   Cassandria Anger [[id number]]    Follow-up: Return if symptoms worsen or fail to improve.  Wilfred Lacy, NP

## 2016-11-07 ENCOUNTER — Ambulatory Visit: Payer: Federal, State, Local not specified - PPO

## 2016-11-21 ENCOUNTER — Inpatient Hospital Stay: Admission: RE | Admit: 2016-11-21 | Payer: Federal, State, Local not specified - PPO | Source: Ambulatory Visit

## 2017-01-23 ENCOUNTER — Ambulatory Visit: Payer: Self-pay

## 2017-01-23 ENCOUNTER — Ambulatory Visit (INDEPENDENT_AMBULATORY_CARE_PROVIDER_SITE_OTHER): Payer: Federal, State, Local not specified - PPO | Admitting: Sports Medicine

## 2017-01-23 ENCOUNTER — Encounter: Payer: Self-pay | Admitting: Sports Medicine

## 2017-01-23 VITALS — BP 104/78 | HR 86 | Ht 69.0 in | Wt 252.4 lb

## 2017-01-23 DIAGNOSIS — M25532 Pain in left wrist: Secondary | ICD-10-CM | POA: Diagnosis not present

## 2017-01-23 DIAGNOSIS — M1812 Unilateral primary osteoarthritis of first carpometacarpal joint, left hand: Secondary | ICD-10-CM

## 2017-01-23 DIAGNOSIS — M25531 Pain in right wrist: Secondary | ICD-10-CM

## 2017-01-23 DIAGNOSIS — M1811 Unilateral primary osteoarthritis of first carpometacarpal joint, right hand: Secondary | ICD-10-CM | POA: Diagnosis not present

## 2017-01-23 NOTE — Progress Notes (Signed)
OFFICE VISIT NOTE Mary Sanford. [place], [place] [facility] [place]  MERCEDES FORT - 51 y.o. female MRN [id number]  Date of birth: Jul 26, 1966  Visit Date: 01/23/2017  PCP: [name], MD   Referred by: [name], MD  Burlene Arnt, CMA acting as scribe for Dr. Paulla Fore.  SUBJECTIVE:   Chief Complaint  Patient presents with  . Follow-up    right hand pain, arthritis CMC joint right thumb   HPI: As below and per problem based documentation when appropriate.  Pt presents today in follow-up of right hand pain, arthritis CMC joint right thumb.  She received steroid injection 09/22/17. She was given a thumb spica splint to wear and encouraged to soak the hand if pain flared up. She was also prescribed Pennsaid.    Pt reports that the pain in her right hand has improved but she is now having pain in the left hand.  She is currently waring a wrist brace. She has constant pain in the left hand. She has marked the areas on her hand with pen for Dr. Paulla Fore to see. She has pain when typing, bending the wrist, picking things up. The pain is worse when there isn't compression on the wrist. She has tried icing the wrist and taking Ibuprofen with no relief. She denies radiating pain into the fingers or arm.   Pt denies fever, chills. She has night sweats but attributes that to menopause.     Review of Systems  Constitutional: Negative for chills and fever.  Respiratory: Negative for shortness of breath and wheezing.   Cardiovascular: Negative for chest pain and palpitations.  Musculoskeletal: Positive for joint pain. Negative for falls.  Neurological: Positive for dizziness and headaches. Negative for tingling.  Endo/Heme/Allergies: Does not bruise/bleed easily.    Otherwise per HPI.  HISTORY & PERTINENT PRIOR DATA:  No specialty comments available. She reports that she quit smoking about 17 years ago. Her smoking use included  Cigarettes. She has never used smokeless tobacco.   Recent Labs  05/22/16 1141  HGBA1C 5.7   Medications & Allergies reviewed per EMR Patient Active Problem List   Diagnosis Date Noted  . Primary osteoarthritis of first carpometacarpal joint of left hand 01/23/2017  . Arthritis of carpometacarpal [facility]) joint of right thumb 09/22/2016  . Acute upper respiratory infection 07/02/2016  . ASCUS pap with negative high risk HPV on 06/30/16 06/30/2016  . Arthritis of carpometacarpal [facility]) joint of left thumb 05/06/2016  . Onychomycosis of toenail 01/15/2016  . Anxiety and depression 12/28/2015  . De Quervain's tenosynovitis, left 06/21/2015  . Prediabetes 11/02/2014  . Anemia, iron deficiency 09/17/2013  . Allergic rhinitis 01/12/2013  . Constipation, chronic 10/04/2012  . Hypokalemia 05/13/2012  . B12 deficiency anemia 01/09/2012  . Obesity (BMI 30-39.9) 01/09/2012  . Essential hypertension, benign 11/24/2011   Past Medical History:  Diagnosis Date  . Anemia   . Anxiety   . B12 deficiency   . Cough    non- productive  . Depression    no meds  . Hypertension   . Kidney stones    Family History  Problem Relation Age of Onset  . Hypertension Mother   . Hypothyroidism Mother   . Asthma Mother   . Cancer Mother 69       Colon and rectal  . Hypotension Sister   . Hypothyroidism Sister   . Diabetes Sister   . Seizures Brother   .  Hypothyroidism Sister   . Seizures Sister   . Anemia Sister   . Asthma Unknown   . Hypothyroidism Unknown   . Heart disease Paternal Grandfather        >55  . Stroke Maternal Uncle        > 55  . Deep vein thrombosis Sister   . Early death Neg Hx   . Hyperlipidemia Neg Hx   . Kidney disease Neg Hx    Past Surgical History:  Procedure Laterality Date  . caesarean section    . cystoscopic     extraction of ureteric calculus without disintegration  . DILITATION & CURRETTAGE/HYSTROSCOPY WITH NOVASURE ABLATION N/A 06/17/2013   Procedure:  DILATATION & CURETTAGE/HYSTEROSCOPY WITH NOVASURE ABLATION;  Surgeon: Osborne Oman, MD;  Location: [facility];  Service: Gynecology;  Laterality: N/A;  . HEMORRHOIDECTOMY WITH HEMORRHOID BANDING  10/19/2012   SCA THD Hem ligation/pexy  . TUBAL LIGATION    . WISDOM TOOTH EXTRACTION     Social History   Occupational History  . minister    Social History Main Topics  . Smoking status: Former Smoker    Types: Cigarettes    Quit date: 08/05/1999  . Smokeless tobacco: Never Used  . Alcohol use Yes  . Drug use: No  . Sexual activity: Not on file    OBJECTIVE:  VS:  HT:5\' 9"  (175.3 cm)   WT:252 lb 6.4 oz (114.5 kg)  BMI:37.4    BP:104/78  HR:86bpm  TEMP: ( )  RESP:96 % EXAM: Findings:  WDWN, NAD, Non-toxic appearing Alert & appropriately interactive Not depressed or anxious appearing No increased work of breathing. Pupils are equal. EOM intact without nystagmus No clubbing or cyanosis of the extremities appreciated No significant rashes/lesions/ulcerations overlying the examined area. Radial pulses 2+/4.  No significant generalized UE edema. Sensation intact to light touch in upper extremities.  Bilateral hands: Overall well aligned she does have bossing of bilateral CMC joints.  She has marked pain with axial loading and circumduction of the left hand.  Grip strength is slightly diminished.  Her left hand is ligamentously stable.     No results found. ASSESSMENT & PLAN:   Problem List Items Addressed This Visit    Arthritis of carpometacarpal (CMC) joint of right thumb   Primary osteoarthritis of first carpometacarpal joint of left hand    Splinting/bracing discussed.  Okay to continue with topical treatments If any lack of improvement will consider repeat injection.  She is not interested in surgical consultation  PROCEDURE NOTE -  ULTRASOUND GUIDEDINJECTION: Left 1st CMC Injection Images were obtained and interpreted by myself, Teresa Coombs, DO  Images have been saved  and stored to PACS system. Images obtained on: GE S7 Ultrasound machine  ULTRASOUND FINDINGS: Degenerative changes with small effusion  DESCRIPTION OF PROCEDURE:  The patient's clinical condition is marked by substantial pain and/or significant functional disability. Other conservative therapy has not provided relief, is contraindicated, or not appropriate. There is a reasonable likelihood that injection will significantly improve the patient's pain and/or functional impairment. After discussing the risks, benefits and expected outcomes of the injection and all questions were reviewed and answered, the patient wished to undergo the above named procedure. Verbal consent was obtained. The ultrasound was used to identify the target structure and adjacent neurovascular structures. The skin was then prepped in sterile fashion and the target structure was injected under direct visualization using sterile technique as below: PREP: Alcohol, Ethel Chloride APPROACH: 25g 1.5" needle INJECTATE: 0.5cc  1% lidocaine, 0.5cc 0.5% marcaine, 0.5cc 40mg  DepoMedrol ASPIRATE: N/A DRESSING: Band-Aid  Post procedural instructions including recommending icing and warning signs for infection were reviewed. This procedure was well tolerated and there were no complications.   IMPRESSION: Succesful US Guided Injection           Other Visit Diagnoses    Left wrist pain    -  Primary   Relevant Orders   [place] LIMITED JOINT SPACE STRUCTURES UP LEFT(NO LINKED CHARGES)   Right wrist pain          Follow-up: Return if symptoms worsen or fail to improve.  No future appointments.   CMA/ATC served as Education administrator during this visit. History, Physical, and Plan performed by medical provider. Documentation and orders reviewed and attested to.      Teresa Coombs, [facility] Physician

## 2017-01-23 NOTE — Patient Instructions (Signed)

## 2017-01-28 ENCOUNTER — Other Ambulatory Visit: Payer: Self-pay | Admitting: Internal Medicine

## 2017-02-02 NOTE — Assessment & Plan Note (Signed)
Splinting/bracing discussed.  Okay to continue with topical treatments If any lack of improvement will consider repeat injection.  She is not interested in surgical consultation  PROCEDURE NOTE -  ULTRASOUND GUIDEDINJECTION: Left 1st CMC Injection Images were obtained and interpreted by myself, Teresa Coombs, DO  Images have been saved and stored to PACS system. Images obtained on: GE S7 Ultrasound machine  ULTRASOUND FINDINGS: Degenerative changes with small effusion  DESCRIPTION OF PROCEDURE:  The patient's clinical condition is marked by substantial pain and/or significant functional disability. Other conservative therapy has not provided relief, is contraindicated, or not appropriate. There is a reasonable likelihood that injection will significantly improve the patient's pain and/or functional impairment. After discussing the risks, benefits and expected outcomes of the injection and all questions were reviewed and answered, the patient wished to undergo the above named procedure. Verbal consent was obtained. The ultrasound was used to identify the target structure and adjacent neurovascular structures. The skin was then prepped in sterile fashion and the target structure was injected under direct visualization using sterile technique as below: PREP: Alcohol, Ethel Chloride APPROACH: 25g 1.5" needle INJECTATE: 0.5cc 1% lidocaine, 0.5cc 0.5% marcaine, 0.5cc 40mg  DepoMedrol ASPIRATE: N/A DRESSING: Band-Aid  Post procedural instructions including recommending icing and warning signs for infection were reviewed. This procedure was well tolerated and there were no complications.   IMPRESSION: Succesful US Guided Injection

## 2017-03-16 ENCOUNTER — Other Ambulatory Visit: Payer: Self-pay | Admitting: Internal Medicine

## 2017-03-30 ENCOUNTER — Other Ambulatory Visit: Payer: Self-pay | Admitting: Internal Medicine

## 2017-04-01 ENCOUNTER — Ambulatory Visit (INDEPENDENT_AMBULATORY_CARE_PROVIDER_SITE_OTHER): Payer: Federal, State, Local not specified - PPO | Admitting: Internal Medicine

## 2017-04-01 ENCOUNTER — Encounter: Payer: Self-pay | Admitting: Internal Medicine

## 2017-04-01 ENCOUNTER — Other Ambulatory Visit (INDEPENDENT_AMBULATORY_CARE_PROVIDER_SITE_OTHER): Payer: Federal, State, Local not specified - PPO

## 2017-04-01 VITALS — BP 110/70 | HR 83 | Temp 98.0°F | Ht 69.0 in | Wt 257.5 lb

## 2017-04-01 DIAGNOSIS — D518 Other vitamin B12 deficiency anemias: Secondary | ICD-10-CM

## 2017-04-01 DIAGNOSIS — D508 Other iron deficiency anemias: Secondary | ICD-10-CM

## 2017-04-01 DIAGNOSIS — Z0000 Encounter for general adult medical examination without abnormal findings: Secondary | ICD-10-CM

## 2017-04-01 DIAGNOSIS — E669 Obesity, unspecified: Secondary | ICD-10-CM

## 2017-04-01 DIAGNOSIS — R7303 Prediabetes: Secondary | ICD-10-CM

## 2017-04-01 DIAGNOSIS — I10 Essential (primary) hypertension: Secondary | ICD-10-CM

## 2017-04-01 DIAGNOSIS — Z23 Encounter for immunization: Secondary | ICD-10-CM | POA: Diagnosis not present

## 2017-04-01 DIAGNOSIS — Z1231 Encounter for screening mammogram for malignant neoplasm of breast: Secondary | ICD-10-CM

## 2017-04-01 LAB — CBC WITH DIFFERENTIAL/PLATELET
BASOS ABS: 0 10*3/uL (ref 0.0–0.1)
Basophils Relative: 0.5 % (ref 0.0–3.0)
EOS ABS: 0.3 10*3/uL (ref 0.0–0.7)
Eosinophils Relative: 3.8 % (ref 0.0–5.0)
HEMATOCRIT: 41.6 % (ref 36.0–46.0)
Hemoglobin: 13.3 g/dL (ref 12.0–15.0)
LYMPHS PCT: 35.1 % (ref 12.0–46.0)
Lymphs Abs: 2.6 10*3/uL (ref 0.7–4.0)
MCHC: 32.1 g/dL (ref 30.0–36.0)
MCV: 74 fl — ABNORMAL LOW (ref 78.0–100.0)
Monocytes Absolute: 0.8 10*3/uL (ref 0.1–1.0)
Monocytes Relative: 11 % (ref 3.0–12.0)
NEUTROS ABS: 3.6 10*3/uL (ref 1.4–7.7)
NEUTROS PCT: 49.6 % (ref 43.0–77.0)
Platelets: 294 10*3/uL (ref 150.0–400.0)
RBC: 5.63 Mil/uL — AB (ref 3.87–5.11)
RDW: 14.2 % (ref 11.5–15.5)
WBC: 7.3 10*3/uL (ref 4.0–10.5)

## 2017-04-01 LAB — COMPREHENSIVE METABOLIC PANEL
ALK PHOS: 55 U/L (ref 39–117)
ALT: 25 U/L (ref 0–35)
AST: 17 U/L (ref 0–37)
Albumin: 3.7 g/dL (ref 3.5–5.2)
BILIRUBIN TOTAL: 0.2 mg/dL (ref 0.2–1.2)
BUN: 11 mg/dL (ref 6–23)
CALCIUM: 9.6 mg/dL (ref 8.4–10.5)
CO2: 29 mEq/L (ref 19–32)
CREATININE: 0.78 mg/dL (ref 0.40–1.20)
Chloride: 105 mEq/L (ref 96–112)
GFR: 100.58 mL/min (ref >60.00)
Glucose, Bld: 112 mg/dL — ABNORMAL HIGH (ref 70–99)
Potassium: 4 mEq/L (ref 3.5–5.1)
Sodium: 139 mEq/L (ref 135–145)
Total Protein: 6.7 g/dL (ref 6.0–8.3)

## 2017-04-01 LAB — LIPID PANEL
CHOL/HDL RATIO: 4
Cholesterol: 135 mg/dL (ref 0–200)
HDL: 38.3 mg/dL — AB (ref >39.00)
LDL CALC: 78 mg/dL (ref 0–99)
NonHDL: 96.79
TRIGLYCERIDES: 93 mg/dL (ref 0.0–149.0)
VLDL: 18.6 mg/dL (ref 0.0–40.0)

## 2017-04-01 LAB — TSH: TSH: 1.21 u[IU]/mL (ref 0.35–4.50)

## 2017-04-01 LAB — HEMOGLOBIN A1C: HEMOGLOBIN A1C: 6.3 % (ref 4.6–6.5)

## 2017-04-01 NOTE — Patient Instructions (Signed)

## 2017-04-01 NOTE — Progress Notes (Signed)
Subjective:  Patient ID: Mary Sanford, female    DOB: Sep 06, 1965  Age: 51 y.o. MRN: [id number]  CC: Annual Exam and Hypertension   HPI ATLANTIS DELONG presents for a CPX.   She is frustrated with her obesity and has not been able to lose weight despite diet, exercise, and a course of contrave. She is ready to consider bariatric options. She has otherwise felt well and tells me her blood pressure is well-controlled.  Outpatient Medications Prior to Visit  Medication Sig Dispense Refill  . albuterol (PROVENTIL HFA;VENTOLIN HFA) 108 (90 Base) MCG/ACT inhaler Inhale 1-2 puffs into the lungs every 6 (six) hours as needed for wheezing or shortness of breath. 1 Inhaler 0  . CONTRAVE 8-90 MG TB12 TAKE TWO TABLETS BY MOUTH TWICE DAILY 120 tablet 11  . ACAI BERRY PO Take by mouth.    . escitalopram (LEXAPRO) 10 MG tablet Take 1 tablet (10 mg total) by mouth daily. Annual appt due in Oct must see MD for refills 90 tablet 0  . hydrochlorothiazide (HYDRODIURIL) 25 MG tablet TAKE ONE TABLET BY MOUTH ONCE DAILY 90 tablet 0  . ibuprofen (ADVIL,MOTRIN) 800 MG tablet Take 1 tablet (800 mg total) by mouth 3 (three) times daily with meals as needed for headache or moderate pain. 30 tablet 3  . potassium chloride (K-DUR,KLOR-CON) 10 MEQ tablet TAKE ONE TABLET BY MOUTH TWICE DAILY 180 tablet 1  . escitalopram (LEXAPRO) 10 MG tablet      No facility-administered medications prior to visit.     ROS Review of Systems  Constitutional: Positive for unexpected weight change. Negative for appetite change, diaphoresis and fatigue.  HENT: Negative.  Negative for sinus pressure.   Eyes: Negative for visual disturbance.  Respiratory: Negative.  Negative for apnea, cough, chest tightness, shortness of breath and wheezing.   Cardiovascular: Negative for chest pain, palpitations and leg swelling.  Gastrointestinal: Negative.  Negative for abdominal pain, constipation, diarrhea, nausea and vomiting.  Endocrine:  Negative.   Genitourinary: Negative.  Negative for difficulty urinating.  Musculoskeletal: Negative.   Skin: Negative.   Allergic/Immunologic: Negative.   Neurological: Negative.  Negative for dizziness.  Hematological: Negative for adenopathy. Does not bruise/bleed easily.  Psychiatric/Behavioral: Negative.  Negative for decreased concentration and sleep disturbance. The patient is not nervous/anxious.     Objective:  BP 110/70 (BP Location: Left Arm, Patient Position: Sitting, Cuff Size: Large)   Pulse 83   Temp 98 F (36.7 C) (Oral)   Ht 5\' 9"  (1.753 m)   Wt 257 lb 8 oz (116.8 kg)   LMP 03/09/2017 (Approximate)   SpO2 99%   BMI 38.03 kg/m   BP Readings from Last 3 Encounters:  04/01/17 110/70  01/23/17 104/78  10/24/16 (!) 156/104    Wt Readings from Last 3 Encounters:  04/01/17 257 lb 8 oz (116.8 kg)  01/23/17 252 lb 6.4 oz (114.5 kg)  10/24/16 246 lb (111.6 kg)    Physical Exam  Constitutional: She is oriented to person, place, and time. No distress.  HENT:  Mouth/Throat: Oropharynx is clear and moist. No oropharyngeal exudate.  Eyes: Conjunctivae are normal. Right eye exhibits no discharge. Left eye exhibits no discharge. No scleral icterus.  Neck: Normal range of motion. Neck supple. No JVD present. No thyromegaly present.  Cardiovascular: Normal rate, regular rhythm and intact distal pulses.  Exam reveals no gallop and no friction rub.   No murmur heard. Pulmonary/Chest: Effort normal and breath sounds normal. No respiratory distress. She  has no wheezes. She has no rales. She exhibits no tenderness.  Abdominal: Soft. Bowel sounds are normal. She exhibits no distension and no mass. There is no tenderness. There is no rebound and no guarding.  Musculoskeletal: Normal range of motion. She exhibits no edema, tenderness or deformity.  Lymphadenopathy:    She has no cervical adenopathy.  Neurological: She is alert and oriented to person, place, and time.  Skin: Skin  is warm and dry. No rash noted. She is not diaphoretic. No erythema. No pallor.  Psychiatric: She has a normal mood and affect. Her behavior is normal. Judgment and thought content normal.  Vitals reviewed.   Lab Results  Component Value Date   WBC 7.3 04/01/2017   HGB 13.3 04/01/2017   HCT 41.6 04/01/2017   PLT 294.0 04/01/2017   GLUCOSE 112 (H) 04/01/2017   CHOL 135 04/01/2017   TRIG 93.0 04/01/2017   HDL 38.30 (L) 04/01/2017   LDLCALC 78 04/01/2017   ALT 25 04/01/2017   AST 17 04/01/2017   NA 139 04/01/2017   K 4.0 04/01/2017   CL 105 04/01/2017   CREATININE 0.78 04/01/2017   BUN 11 04/01/2017   CO2 29 04/01/2017   TSH 1.21 04/01/2017   HGBA1C 6.3 04/01/2017    US Transvaginal Non-ob  Result Date: 07/05/2016 CLINICAL DATA:  Chronic dyspareunia.  Initial encounter. EXAM: TRANSABDOMINAL AND TRANSVAGINAL ULTRASOUND OF PELVIS TECHNIQUE: Both transabdominal and transvaginal ultrasound examinations of the pelvis were performed. Transabdominal technique was performed for global imaging of the pelvis including uterus, ovaries, adnexal regions, and pelvic cul-de-sac. It was necessary to proceed with endovaginal exam following the transabdominal exam to visualize the endometrium and ovaries in greater detail. COMPARISON:  Pelvic ultrasound performed 02/28/2013 FINDINGS: Uterus Measurements: 8.8 x 4.8 x 6.5 cm. A 1.8 cm myometrial fibroid is noted at the left uterine fundus. Endometrium Thickness: 0.4 cm. No focal abnormality visualized, though difficult to fully characterize. Right ovary Measurements: 3.1 x 1.6 x 3.1 cm. Normal appearance/no adnexal mass. Left ovary Not visualized. Prominent venous structures are noted at the left adnexa. Other findings Trace free fluid is noted within the pelvic cul-de-sac. IMPRESSION: 1. Small uterine fibroid noted.  Uterus otherwise unremarkable. 2. Left ovary not visualized. Right ovary unremarkable in appearance. 3. Prominent venous structures at the left  adnexa raise question for pelvic congestion syndrome in the appropriate clinical situation. Electronically Signed   By: Garald Balding M.D.   On: 07/05/2016 05:23   US Pelvis Complete  Result Date: 07/05/2016 CLINICAL DATA:  Chronic dyspareunia.  Initial encounter. EXAM: TRANSABDOMINAL AND TRANSVAGINAL ULTRASOUND OF PELVIS TECHNIQUE: Both transabdominal and transvaginal ultrasound examinations of the pelvis were performed. Transabdominal technique was performed for global imaging of the pelvis including uterus, ovaries, adnexal regions, and pelvic cul-de-sac. It was necessary to proceed with endovaginal exam following the transabdominal exam to visualize the endometrium and ovaries in greater detail. COMPARISON:  Pelvic ultrasound performed 02/28/2013 FINDINGS: Uterus Measurements: 8.8 x 4.8 x 6.5 cm. A 1.8 cm myometrial fibroid is noted at the left uterine fundus. Endometrium Thickness: 0.4 cm. No focal abnormality visualized, though difficult to fully characterize. Right ovary Measurements: 3.1 x 1.6 x 3.1 cm. Normal appearance/no adnexal mass. Left ovary Not visualized. Prominent venous structures are noted at the left adnexa. Other findings Trace free fluid is noted within the pelvic cul-de-sac. IMPRESSION: 1. Small uterine fibroid noted.  Uterus otherwise unremarkable. 2. Left ovary not visualized. Right ovary unremarkable in appearance. 3. Prominent venous structures at  the left adnexa raise question for pelvic congestion syndrome in the appropriate clinical situation. Electronically Signed   By: Garald Balding M.D.   On: 07/05/2016 05:23    Assessment & Plan:   Mikael was seen today for annual exam and hypertension.  Diagnoses and all orders for this visit:  Essential hypertension, benign- Her blood pressures well controlled. Electrolytes and renal function are normal. -     Comprehensive metabolic panel; Future -     TSH; Future  Other vitamin B12 deficiency anemia- improvement noted -      CBC with Differential/Platelet; Future  Other iron deficiency anemia- this has resolved -     CBC with Differential/Platelet; Future  Routine general medical examination at a health care facility- exam completed, labs reviewed, vaccines reviewed and updated, she was referred for screening mammogram  Her PAP is up-to-date, patient education material was given. -     Lipid panel; Future  Prediabetes- her A1c is up to 6.3%. Medical therapy is not indicated. She is committed to working on her lifestyle modifications. -     Hemoglobin A1c; Future  Obesity (BMI 30-39.9) -     Ambulatory referral to General Surgery  Visit for screening mammogram -     MM DIGITAL SCREENING BILATERAL; Future  Need for influenza vaccination -     Flu Vaccine QUAD 36+ mos IM   I have discontinued Ms. Luba's potassium chloride, escitalopram, ibuprofen, ACAI BERRY PO, escitalopram, and hydrochlorothiazide. I am also having her maintain her albuterol and CONTRAVE.  No orders of the defined types were placed in this encounter.    Follow-up: Return in about 6 months (around 10/01/2017).  Scarlette Calico, MD

## 2017-04-14 DIAGNOSIS — E669 Obesity, unspecified: Secondary | ICD-10-CM | POA: Diagnosis not present

## 2017-04-14 DIAGNOSIS — I10 Essential (primary) hypertension: Secondary | ICD-10-CM | POA: Diagnosis not present

## 2017-04-14 DIAGNOSIS — M545 Low back pain: Secondary | ICD-10-CM | POA: Diagnosis not present

## 2017-04-14 DIAGNOSIS — G8929 Other chronic pain: Secondary | ICD-10-CM | POA: Diagnosis not present

## 2017-04-15 ENCOUNTER — Encounter: Payer: Self-pay | Admitting: Internal Medicine

## 2017-04-20 ENCOUNTER — Ambulatory Visit
Admission: RE | Admit: 2017-04-20 | Discharge: 2017-04-20 | Disposition: A | Discharge status: Home / Self Care | Payer: Federal, State, Local not specified - PPO | Source: Ambulatory Visit | Attending: Internal Medicine | Admitting: Internal Medicine

## 2017-04-20 DIAGNOSIS — Z1231 Encounter for screening mammogram for malignant neoplasm of breast: Secondary | ICD-10-CM

## 2017-04-20 LAB — HM MAMMOGRAPHY

## 2017-04-23 ENCOUNTER — Encounter: Payer: Self-pay | Admitting: Internal Medicine

## 2017-05-11 ENCOUNTER — Encounter: Payer: Self-pay | Admitting: Registered"

## 2017-05-11 ENCOUNTER — Encounter: Payer: Federal, State, Local not specified - PPO | Attending: General Surgery | Admitting: Registered"

## 2017-05-11 DIAGNOSIS — E669 Obesity, unspecified: Secondary | ICD-10-CM

## 2017-05-11 DIAGNOSIS — R7303 Prediabetes: Secondary | ICD-10-CM | POA: Insufficient documentation

## 2017-05-11 DIAGNOSIS — M25531 Pain in right wrist: Secondary | ICD-10-CM | POA: Insufficient documentation

## 2017-05-11 DIAGNOSIS — Z6837 Body mass index (BMI) 37.0-37.9, adult: Secondary | ICD-10-CM | POA: Insufficient documentation

## 2017-05-11 DIAGNOSIS — M545 Low back pain: Secondary | ICD-10-CM | POA: Diagnosis not present

## 2017-05-11 DIAGNOSIS — I10 Essential (primary) hypertension: Secondary | ICD-10-CM | POA: Insufficient documentation

## 2017-05-11 NOTE — Progress Notes (Signed)
Pre-Op Assessment Visit:  Pre-Operative RYGB Surgery  Medical Nutrition Therapy:  Appt start time: 9:30  End time:  10:32  Patient was seen on 05/11/2017 for Pre-Operative Nutrition Assessment. Assessment and letter of approval faxed to [facility] Bariatric Surgery Program coordinator on 05/11/2017.   Pt expectation of surgery: be healthier, prevent diabetes  Pt expectation of Dietitian: want to learn about foods and make it a lifestyle, keep on track, accountability  Start weight at [facility]: 254.8 BMI: 37.09   Pt is talkative. Pt arrives with husband and states she loves seafood, fruit, and vegetables. Pt states she has done a lot of research on bariatric surgery.   Per insurance, pt needs 0 SWL visits prior to surgery. Pt states she has had already had her SWL visits PCP.    24 hr Dietary Recall: First Meal: [employer]-chicken minis Snack: chips Second Meal: seafood, burgers, pizza Snack: chocolate Third Meal: steak, rice, scallops Snack: none Beverages: water, diet coke, beer, wine, unsweetened tea (sometimes)  Encouraged to engage in 75 minutes of moderate physical activity including cardiovascular and weight baring weekly  Handouts given during visit include:  . Pre-Op Goals . Bariatric Surgery Protein Shakes . Vitamin and Mineral Recommendations  During the appointment today the following Pre-Op Goals were reviewed with the patient: . Maintain or lose weight as instructed by your surgeon . Make healthy food choices . Begin to limit portion sizes . Limited concentrated sugars and fried foods . Keep fat/sugar in the single digits per serving on          food labels . Practice CHEWING your food  (aim for 30 chews per bite or until applesauce consistency) . Practice not drinking 15 minutes before, during, and 30 minutes after each meal/snack . Avoid all carbonated beverages  . Avoid/limit caffeinated beverages  . Avoid all sugar-sweetened beverages . Consume  3 meals per day; eat every 3-5 hours . Make a list of non-food related activities . Aim for 64-100 ounces of FLUID daily  . Aim for at least 60-80 grams of PROTEIN daily . Look for a liquid protein source that contain ?15 g protein and ?5 g carbohydrate  (ex: shakes, drinks, shots) . Physical activity is an important part of a healthy lifestyle so keep it moving!  Follow diet recommendations listed below Energy and Macronutrient Recommendations: Calories: 1800 Carbohydrate: 200 Protein: 135 Fat: 50  Demonstrated degree of understanding via:  Teach Back   Teaching Method Utilized:  Visual Auditory Hands on  Barriers to learning/adherence to lifestyle change:   Patient to call the [facility] to enroll in Pre-Op and Post-Op Nutrition Education when surgery date is scheduled.

## 2017-05-13 DIAGNOSIS — F509 Eating disorder, unspecified: Secondary | ICD-10-CM | POA: Diagnosis not present

## 2017-05-16 ENCOUNTER — Other Ambulatory Visit: Payer: Self-pay | Admitting: Internal Medicine

## 2017-05-18 ENCOUNTER — Encounter: Payer: Federal, State, Local not specified - PPO | Admitting: Skilled Nursing Facility1

## 2017-05-18 DIAGNOSIS — M545 Low back pain: Secondary | ICD-10-CM | POA: Diagnosis not present

## 2017-05-18 DIAGNOSIS — R7303 Prediabetes: Secondary | ICD-10-CM | POA: Diagnosis not present

## 2017-05-18 DIAGNOSIS — E669 Obesity, unspecified: Secondary | ICD-10-CM | POA: Diagnosis not present

## 2017-05-18 DIAGNOSIS — I10 Essential (primary) hypertension: Secondary | ICD-10-CM | POA: Diagnosis not present

## 2017-05-18 DIAGNOSIS — Z6837 Body mass index (BMI) 37.0-37.9, adult: Secondary | ICD-10-CM | POA: Diagnosis not present

## 2017-05-18 DIAGNOSIS — M25531 Pain in right wrist: Secondary | ICD-10-CM | POA: Diagnosis not present

## 2017-05-19 ENCOUNTER — Encounter: Payer: Self-pay | Admitting: Skilled Nursing Facility1

## 2017-05-19 NOTE — Progress Notes (Signed)
Pre-Operative Nutrition Class:  Appt start time: 3710   End time:  1830.  Patient was seen on 05/18/2017 for Pre-Operative Bariatric Surgery Education at [facility].   Surgery date:  Surgery type: RYGB Start weight at [facility]: 254.8 Weight today: 252.2  Samples given per MNT protocol. Patient educated on appropriate usage: Bariatric Advantage Multivitamin Lot # [id number] Exp: 07/19  Bariatric Advantage Calcium  Lot # [id number] Exp: nov-09-2016  Renee Pain Protein  Shake Lot # [id number] Exp: Dec 29, 2017  The following the learning objectives were met by the patient during this course:  Identify Pre-Op Dietary Goals and will begin 2 weeks pre-operatively  Identify appropriate sources of fluids and proteins   State protein recommendations and appropriate sources pre and post-operatively  Identify Post-Operative Dietary Goals and will follow for 2 weeks post-operatively  Identify appropriate multivitamin and calcium sources  Describe the need for physical activity post-operatively and will follow MD recommendations  State when to call healthcare provider regarding medication questions or post-operative complications  Handouts given during class include:  Pre-Op Bariatric Surgery Diet Handout  Protein Shake Handout  Post-Op Bariatric Surgery Nutrition Handout  BELT Program Information Flyer  Support Group Information Flyer  [facility] Bariatric Supplements Price List  Follow-Up Plan: [facility] per MD.

## 2017-05-20 ENCOUNTER — Encounter: Payer: Self-pay | Admitting: Gastroenterology

## 2017-05-22 DIAGNOSIS — F509 Eating disorder, unspecified: Secondary | ICD-10-CM | POA: Diagnosis not present

## 2017-06-17 ENCOUNTER — Other Ambulatory Visit: Payer: Self-pay

## 2017-06-17 ENCOUNTER — Ambulatory Visit (AMBULATORY_SURGERY_CENTER): Payer: Self-pay | Admitting: *Deleted

## 2017-06-17 VITALS — Ht 69.0 in | Wt 255.0 lb

## 2017-06-17 DIAGNOSIS — Z800 Family history of malignant neoplasm of digestive organs: Secondary | ICD-10-CM

## 2017-06-17 MED ORDER — NA SULFATE-K SULFATE-MG SULF 17.5-3.13-1.6 GM/177ML PO SOLN
1.0000 | Freq: Once | ORAL | 0 refills | Status: AC
Start: 2017-06-17 — End: 2017-06-17

## 2017-06-17 NOTE — Progress Notes (Signed)
No egg or soy allergy known to patient  No issues with past sedation with any surgeries  or procedures, no intubation problems  No diet pills per patient No home 02 use per patient  No blood thinners per patient  Pt denies issues with constipation  No A fib or A flutter  EMMI video sent to pt's e mail  

## 2017-06-24 ENCOUNTER — Ambulatory Visit (HOSPITAL_COMMUNITY)
Admission: RE | Admit: 2017-06-24 | Discharge: 2017-06-24 | Disposition: A | Discharge status: Home / Self Care | Payer: Federal, State, Local not specified - PPO | Source: Ambulatory Visit | Attending: General Surgery | Admitting: General Surgery

## 2017-06-24 DIAGNOSIS — E669 Obesity, unspecified: Secondary | ICD-10-CM

## 2017-06-24 DIAGNOSIS — R079 Chest pain, unspecified: Secondary | ICD-10-CM | POA: Diagnosis not present

## 2017-07-01 ENCOUNTER — Other Ambulatory Visit: Payer: Self-pay | Admitting: Internal Medicine

## 2017-07-07 ENCOUNTER — Telehealth: Payer: Self-pay | Admitting: Internal Medicine

## 2017-07-07 NOTE — Telephone Encounter (Signed)
Called regarding a recall on the b/p med she is taking. Pt advised to check with her pharmacy regarding Hydrochlorothiazide.Pt voiced understanding.

## 2017-07-08 ENCOUNTER — Other Ambulatory Visit: Payer: Self-pay

## 2017-07-08 ENCOUNTER — Ambulatory Visit (AMBULATORY_SURGERY_CENTER): Payer: Federal, State, Local not specified - PPO | Admitting: Gastroenterology

## 2017-07-08 ENCOUNTER — Encounter: Payer: Self-pay | Admitting: Gastroenterology

## 2017-07-08 VITALS — BP 111/74 | HR 65 | Temp 96.8°F | Resp 14 | Ht 69.0 in | Wt 255.0 lb

## 2017-07-08 DIAGNOSIS — K635 Polyp of colon: Secondary | ICD-10-CM | POA: Diagnosis not present

## 2017-07-08 DIAGNOSIS — D125 Benign neoplasm of sigmoid colon: Secondary | ICD-10-CM | POA: Diagnosis not present

## 2017-07-08 DIAGNOSIS — D123 Benign neoplasm of transverse colon: Secondary | ICD-10-CM | POA: Diagnosis not present

## 2017-07-08 DIAGNOSIS — Z800 Family history of malignant neoplasm of digestive organs: Secondary | ICD-10-CM | POA: Diagnosis not present

## 2017-07-08 DIAGNOSIS — D126 Benign neoplasm of colon, unspecified: Secondary | ICD-10-CM | POA: Diagnosis not present

## 2017-07-08 DIAGNOSIS — Z1211 Encounter for screening for malignant neoplasm of colon: Secondary | ICD-10-CM | POA: Diagnosis not present

## 2017-07-08 MED ORDER — SODIUM CHLORIDE 0.9 % IV SOLN: 500.0000 mL | Freq: Once | INTRAVENOUS | Status: DC

## 2017-07-08 NOTE — Progress Notes (Signed)
Pt's states no medical or surgical changes since previsit or office visit. 

## 2017-07-08 NOTE — Progress Notes (Signed)
A/ox3 pleased with MAC, report to Sheila RN 

## 2017-07-08 NOTE — Progress Notes (Signed)
Called to room to assist during endoscopic procedure.  Patient ID and intended procedure confirmed with present staff. Received instructions for my participation in the procedure from the performing physician.  

## 2017-07-08 NOTE — Patient Instructions (Signed)
YOU HAD AN ENDOSCOPIC PROCEDURE TODAY AT [facility]:   Refer to the procedure report that was given to you for any specific questions about what was found during the examination.  If the procedure report does not answer your questions, please call your gastroenterologist to clarify.  If you requested that your care partner not be given the details of your procedure findings, then the procedure report has been included in a sealed envelope for you to review at your convenience later.  YOU SHOULD EXPECT: Some feelings of bloating in the abdomen. Passage of more gas than usual.  Walking can help get rid of the air that was put into your GI tract during the procedure and reduce the bloating. If you had a lower endoscopy (such as a colonoscopy or flexible sigmoidoscopy) you may notice spotting of blood in your stool or on the toilet paper. If you underwent a bowel prep for your procedure, you may not have a normal bowel movement for a few days.  Please Note:  You might notice some irritation and congestion in your nose or some drainage.  This is from the oxygen used during your procedure.  There is no need for concern and it should clear up in a day or so.  SYMPTOMS TO REPORT IMMEDIATELY:   Following lower endoscopy (colonoscopy or flexible sigmoidoscopy):  Excessive amounts of blood in the stool  Significant tenderness or worsening of abdominal pains  Swelling of the abdomen that is new, acute  Fever of 100F or higher    For urgent or emergent issues, a gastroenterologist can be reached at any hour by calling [phone].   DIET:  We do recommend a small meal at first, but then you may proceed to your regular diet.  Drink plenty of fluids but you should avoid alcoholic beverages for 24 hours.  ACTIVITY:  You should plan to take it easy for the rest of today and you should NOT DRIVE or use heavy machinery until tomorrow (because of the sedation medicines used during the test).     FOLLOW UP: Our staff will call the number listed on your records the next business day following your procedure to check on you and address any questions or concerns that you may have regarding the information given to you following your procedure. If we do not reach you, we will leave a message.  However, if you are feeling well and you are not experiencing any problems, there is no need to return our call.  We will assume that you have returned to your regular daily activities without incident.  If any biopsies were taken you will be contacted by phone or by letter within the next 1-3 weeks.  Please call us at [phone] if you have not heard about the biopsies in 3 weeks.    SIGNATURES/CONFIDENTIALITY: You and/or your care partner have signed paperwork which will be entered into your electronic medical record.  These signatures attest to the fact that that the information above on your After Visit Summary has been reviewed and is understood.  Full responsibility of the confidentiality of this discharge information lies with you and/or your care-partner.   No ibuprofen,naproxen,or other non-steroidal anti-inflammatory drugs for 2 weeks resume remainder of medications. Information given on polyps.

## 2017-07-08 NOTE — Op Note (Signed)
[facility] Patient Name: Mary Sanford Procedure Date: 07/08/2017 11:06 AM MRN: [id number] Endoscopist: Remo Lipps P. Armbruster MD, MD Age: 51 Referring MD:  Date of Birth: 14-Jun-1966 Gender: Female Account #: [id number] Procedure:                Colonoscopy Indications:              Screening patient at increased risk: Family history                            of 1st-degree relative with colorectal cancer                            (mother diagnosed age 17s) Medicines:                Monitored Anesthesia Care Procedure:                Pre-Anesthesia Assessment:                           - Prior to the procedure, a History and Physical                            was performed, and patient medications and                            allergies were reviewed. The patient's tolerance of                            previous anesthesia was also reviewed. The risks                            and benefits of the procedure and the sedation                            options and risks were discussed with the patient.                            All questions were answered, and informed consent                            was obtained. Prior Anticoagulants: The patient has                            taken no previous anticoagulant or antiplatelet                            agents. ASA Grade Assessment: II - A patient with                            mild systemic disease. After reviewing the risks                            and benefits, the patient was deemed in  satisfactory condition to undergo the procedure.                           After obtaining informed consent, the colonoscope                            was passed under direct vision. Throughout the                            procedure, the patient's blood pressure, pulse, and                            oxygen saturations were monitored continuously. The                            Model CF-HQ190L  [id number]) scope was introduced                            through the anus and advanced to the the terminal                            ileum, with identification of the appendiceal                            orifice and IC valve. The colonoscopy was performed                            without difficulty. The patient tolerated the                            procedure well. The quality of the bowel                            preparation was good. The terminal ileum, ileocecal                            valve, appendiceal orifice, and rectum were                            photographed. Scope In: 11:26:11 AM Scope Out: 11:43:57 AM Scope Withdrawal Time: 0 hours 14 minutes 6 seconds  Total Procedure Duration: 0 hours 17 minutes 46 seconds  Findings:                 The perianal and digital rectal examinations were                            normal.                           The terminal ileum appeared normal.                           Two sessile polyps were found in the transverse  colon. The polyps were 3 to 5 mm in size. These                            polyps were removed with a cold snare. Resection                            and retrieval were complete.                           Two sessile polyps were found in the sigmoid colon.                            The polyps were 3 to 4 mm in size. These polyps                            were removed with a cold snare. Resection and                            retrieval were complete.                           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                           The exam was otherwise without abnormality. Complications:            No immediate complications. Estimated blood loss:                            Minimal. Estimated Blood Loss:     Estimated blood loss was minimal. Impression:               - The examined portion of the ileum was normal.                           - Two 3 to 5 mm  polyps in the transverse colon,                            removed with a cold snare. Resected and retrieved.                           - Two 3 to 4 mm polyps in the sigmoid colon,                            removed with a cold snare. Resected and retrieved.                           - Melanosis coli                           - The examination was otherwise normal. Recommendation:           - Patient has a contact number available for  emergencies. The signs and symptoms of potential                            delayed complications were discussed with the                            patient. Return to normal activities tomorrow.                            Written discharge instructions were provided to the                            patient.                           - Resume previous diet.                           - Continue present medications.                           - Await pathology results.                           - Repeat colonoscopy is recommended for                            surveillance. The colonoscopy date will be                            determined after pathology results from today's                            exam become available for review.                           - No ibuprofen, naproxen, or other non-steroidal                            anti-inflammatory drugs for 2 weeks after polyp                            removal. Remo Lipps P. Armbruster MD, MD 07/08/2017 11:48:28 AM This report has been signed electronically.

## 2017-07-09 ENCOUNTER — Telehealth: Payer: Self-pay | Admitting: *Deleted

## 2017-07-09 NOTE — Telephone Encounter (Signed)
.  3 Follow up Call-  Call back number 07/08/2017  Post procedure Call Back phone  # [phone]  Permission to leave phone message Yes  Some recent data might be hidden     Patient questions:  Do you have a fever, pain , or abdominal swelling? No. Pain Score  0 *  Have you tolerated food without any problems? Yes.    Have you been able to return to your normal activities? Yes.    Do you have any questions about your discharge instructions: Diet   No. Medications  No. Follow up visit  No.  Do you have questions or concerns about your Care? No.  Actions: * If pain score is 4 or above: No action needed, pain <4.

## 2017-07-10 ENCOUNTER — Ambulatory Visit (INDEPENDENT_AMBULATORY_CARE_PROVIDER_SITE_OTHER): Payer: Federal, State, Local not specified - PPO | Admitting: Sports Medicine

## 2017-07-10 ENCOUNTER — Ambulatory Visit: Payer: Self-pay

## 2017-07-10 ENCOUNTER — Encounter: Payer: Self-pay | Admitting: Sports Medicine

## 2017-07-10 VITALS — BP 118/82 | HR 74 | Ht 69.0 in | Wt 255.2 lb

## 2017-07-10 DIAGNOSIS — M1812 Unilateral primary osteoarthritis of first carpometacarpal joint, left hand: Secondary | ICD-10-CM

## 2017-07-10 DIAGNOSIS — M654 Radial styloid tenosynovitis [de Quervain]: Secondary | ICD-10-CM | POA: Diagnosis not present

## 2017-07-10 LAB — HM COLONOSCOPY

## 2017-07-10 NOTE — Progress Notes (Signed)
Mary Sanford. Mary Sanford, Mary Sanford at [place]  Mary Sanford - 51 y.o. female MRN [id number]  Date of birth: [birth year]   Scribe for today's visit: Mary Sanford, CMA    SUBJECTIVE:  Mary Sanford is here for Follow-up (LT wrist pain)  Compared to the last office visit, her previously described symptoms are worsening Current symptoms are moderate & are radiating to the wrist. She will have sudden sharp pains that seems to be more severe and are worse with certain movements. She has noticed some swelling around the joint. She does note a constant numbness at the joint. She feels like use of the hand is more limited. She has decreased grip strength.  She has been taking Ibuprofen prn, icing the area, and using compression with mild relief. She had steroid injection LT 1st [place] on 01/23/2017, she did get short term relief from this injection. She has a brace that she has been wearing that also provides some support and relief.    ROS Denies night time disturbances. Denies fevers, chills, or night sweats. Denies unexplained weight loss. Denies personal history of cancer. Denies changes in bowel or bladder habits. Denies recent unreported falls. Denies new or worsening dyspnea or wheezing. Denies headaches or dizziness.  Reports numbness, tingling or weakness  In the extremities.  Denies dizziness or presyncopal episodes Denies lower extremity edema     OBJECTIVE:  VS:  HT:5\' 9"  (175.3 cm)   WT:255 lb 3.2 oz (115.8 kg)  BMI:37.67    BP:118/82  HR:74bpm  TEMP: ( )  RESP:96 %  PHYSICAL EXAM: Constitutional: WDWN, Non-toxic appearing. Psychiatric: Alert & appropriately interactive. Not depressed or anxious appearing. Respiratory: No increased work of breathing. Trachea Midline Eyes: Pupils are equal. EOM intact without nystagmus. No scleral icterus  Left upper extremity is overall well aligned.  She does have small amount of  swelling across the [facility] as well as first dorsal compartment.  Pain with palpation of the first dorsal compartment.  Marked pain with Wynn Maudlin testing.  Mild pain with axial load of the CMC joint.  No pain with loading of the MCP.  She does have a small amount of laxity with stressing of the CMC and MCP joints both anterior posteriorly and radial and ulnar deviation.  No significant overlying skin changes   ASSESSMENT & PLAN:   1. Arthritis of carpometacarpal (CMC) joint of left thumb   2. De Quervain's tenosynovitis, left    PLAN:     De Quervain's tenosynovitis, left Repeat de Quervain's injection today.  She has multifactorial changes including CMC arthritis with superimposed de Quervain's.  This does seem to be more symptomatic with Wynn Maudlin testing and there is essentially no movement of the extensor tendon.  We will follow-up in 6 weeks to ensure clinical improvement and can consider early repeat injection if moderate improvement but persistent tenosynovitis  Arthritis of carpometacarpal (CMC) joint of left thumb Consider thumb spica bracing and topical anti-inflammatories Continue conservative measures at this time but if any lack of improvement can consider MRI of the hand for further delineation between [facility] component and de Quervain's component, may need surgical intervention if persistent lack of improvement   ++++++++++++++++++++++++++++++++++++++++++++ Orders & Meds: Orders Placed This Encounter  Procedures  . [place] LIMITED JOINT SPACE STRUCTURES UP LEFT(NO LINKED CHARGES)    No orders of the defined types were placed in this encounter.   ++++++++++++++++++++++++++++++++++++++++++++ Follow-up: Return in about 6 weeks (around  08/21/2017).   Pertinent documentation may be included in additional procedure notes, imaging studies, problem based documentation and patient instructions. Please see these sections of the encounter for additional information regarding this  visit. CMA/ATC served as Education administrator during this visit. History, Physical, and Plan performed by medical provider. Documentation and orders reviewed and attested to.      Mary Sanford, [place] [facility] Physician

## 2017-07-10 NOTE — Patient Instructions (Signed)

## 2017-07-10 NOTE — Assessment & Plan Note (Signed)
Repeat de Quervain's injection today.  She has multifactorial changes including CMC arthritis with superimposed de Quervain's.  This does seem to be more symptomatic with Wynn Maudlin testing and there is essentially no movement of the extensor tendon.  We will follow-up in 6 weeks to ensure clinical improvement and can consider early repeat injection if moderate improvement but persistent tenosynovitis

## 2017-07-10 NOTE — Procedures (Signed)
PROCEDURE NOTE -  ULTRASOUND GUIDEDINJECTION: First dorsal compartment, left Images were obtained and interpreted by myself, Teresa Coombs, DO  Images have been saved and stored to PACS system. Images obtained on: GE S7 Ultrasound machine  ULTRASOUND FINDINGS: Marked stenosis of the first dorsal compartment with essentially no movement of the extensor tendon.  She does have a small amount of CMC fluid today as well as marked degenerative changes of the [facility] joint but question if this is actually her source of pain..  She has no pain with joint manipulation but does have pain when any type of tension is applied to the tendon  DESCRIPTION OF PROCEDURE:  The patient's clinical condition is marked by substantial pain and/or significant functional disability. Other conservative therapy has not provided relief, is contraindicated, or not appropriate. There is a reasonable likelihood that injection will significantly improve the patient's pain and/or functional impairment. After discussing the risks, benefits and expected outcomes of the injection and all questions were reviewed and answered, the patient wished to undergo the above named procedure. Verbal consent was obtained. The ultrasound was used to identify the target structure and adjacent neurovascular structures. The skin was then prepped in sterile fashion and the target structure was injected under direct visualization using sterile technique as below: PREP: Alcohol, Ethel Chloride, 1 cc 1% lidocaine on insulin needle  APPROACH: Direct inplane, single injection, 25g 1.5" needle INJECTATE: 0.5cc 1% lidocaine, 0.5cc 0.5% marcaine, 0.5cc 40mg  DepoMedrol ASPIRATE: N/A DRESSING: Band-Aid and patient's own thumb spica brace  Post procedural instructions including recommending icing and warning signs for infection were reviewed. This procedure was well tolerated and there were no complications.   IMPRESSION: Succesful US Guided Injection

## 2017-07-10 NOTE — Assessment & Plan Note (Signed)
Consider thumb spica bracing and topical anti-inflammatories Continue conservative measures at this time but if any lack of improvement can consider MRI of the hand for further delineation between [facility] component and de Quervain's component, may need surgical intervention if persistent lack of improvement

## 2017-07-13 ENCOUNTER — Encounter: Payer: Self-pay | Admitting: Gastroenterology

## 2017-07-14 ENCOUNTER — Ambulatory Visit: Payer: Federal, State, Local not specified - PPO | Admitting: Family Medicine

## 2017-07-20 ENCOUNTER — Other Ambulatory Visit: Payer: Self-pay | Admitting: Internal Medicine

## 2017-07-23 ENCOUNTER — Encounter: Payer: Self-pay | Admitting: Internal Medicine

## 2017-07-23 ENCOUNTER — Ambulatory Visit (INDEPENDENT_AMBULATORY_CARE_PROVIDER_SITE_OTHER): Payer: Federal, State, Local not specified - PPO | Admitting: Internal Medicine

## 2017-07-23 VITALS — BP 120/80 | HR 76 | Temp 98.0°F | Resp 16 | Ht 69.0 in | Wt 269.0 lb

## 2017-07-23 DIAGNOSIS — D510 Vitamin B12 deficiency anemia due to intrinsic factor deficiency: Secondary | ICD-10-CM

## 2017-07-23 DIAGNOSIS — I10 Essential (primary) hypertension: Secondary | ICD-10-CM | POA: Diagnosis not present

## 2017-07-23 DIAGNOSIS — E669 Obesity, unspecified: Secondary | ICD-10-CM | POA: Diagnosis not present

## 2017-07-23 MED ORDER — POTASSIUM CHLORIDE CRYS ER 10 MEQ PO TBCR: 10.0000 meq | EXTENDED_RELEASE_TABLET | Freq: Two times a day (BID) | ORAL | 1 refills | Status: DC

## 2017-07-23 MED ORDER — PHENTERMINE HCL 37.5 MG PO TABS: 37.5000 mg | ORAL_TABLET | Freq: Every day | ORAL | 2 refills | Status: DC

## 2017-07-23 MED ORDER — CYANOCOBALAMIN 500 MCG/0.1ML NA SOLN: 0.1000 mL | NASAL | 11 refills | Status: DC

## 2017-07-23 NOTE — Progress Notes (Signed)
Subjective:  Patient ID: Mary Sanford, female    DOB: Jan 05, 1966  Age: 51 y.o. MRN: [id number]  CC: Hypertension   HPI Mary Sanford presents for a BP check - she is concerned about wt gain despite diet and exercise, she has not been approved for bariatric sx. She tells me that her BP has been well controlled.  She does not like the inconvenience of coming here once a month for B12 injections so she wants to change to something that is more convenient.  Outpatient Medications Prior to Visit  Medication Sig Dispense Refill  . acetaminophen (TYLENOL) 500 MG tablet Take 500 mg every 6 (six) hours as needed by mouth.    Marland Kitchen albuterol (PROVENTIL HFA;VENTOLIN HFA) 108 (90 Base) MCG/ACT inhaler Inhale 1-2 puffs into the lungs every 6 (six) hours as needed for wheezing or shortness of breath. 1 Inhaler 0  . escitalopram (LEXAPRO) 10 MG tablet     . hydrochlorothiazide (HYDRODIURIL) 25 MG tablet     . ibuprofen (ADVIL,MOTRIN) 100 MG tablet Take 100 mg by mouth every 6 (six) hours as needed for fever.    . potassium chloride (K-DUR,KLOR-CON) 10 MEQ tablet Take 10 mEq by mouth 2 (two) times daily.    Marland Kitchen CONTRAVE 8-90 MG TB12 TAKE TWO TABLETS BY MOUTH TWICE DAILY (Patient not taking: Reported on 07/23/2017) 120 tablet 11  . escitalopram (LEXAPRO) 10 MG tablet TAKE 1 TABLET BY MOUTH ONCE DAILY **ANNUAL  APPOINTMENT  DUE  IN  OCT  MUST  SEE  MD  FOR  REFILLS** 90 tablet 3  . hydrochlorothiazide (HYDRODIURIL) 25 MG tablet Take 1 tablet (25 mg total) by mouth daily. 90 tablet 0  . potassium chloride (K-DUR,KLOR-CON) 10 MEQ tablet Take 1 tablet (10 mEq total) by mouth 2 (two) times daily. 60 tablet 5  . 0.9 %  sodium chloride infusion      No facility-administered medications prior to visit.     ROS Review of Systems  Constitutional: Positive for unexpected weight change. Negative for appetite change, chills, diaphoresis and fatigue.  HENT: Negative.   Eyes: Negative for visual disturbance.    Respiratory: Negative for cough, chest tightness, shortness of breath and wheezing.   Cardiovascular: Negative.  Negative for chest pain, palpitations and leg swelling.  Gastrointestinal: Negative for abdominal pain, constipation, diarrhea, nausea and vomiting.  Endocrine: Negative.   Genitourinary: Negative.  Negative for difficulty urinating.  Musculoskeletal: Negative.  Negative for back pain, myalgias and neck pain.  Skin: Negative.  Negative for color change.  Neurological: Negative.  Negative for dizziness, weakness, numbness and headaches.  Hematological: Negative for adenopathy. Does not bruise/bleed easily.  Psychiatric/Behavioral: Negative.     Objective:  BP 120/80 (BP Location: Left Arm, Patient Position: Sitting, Cuff Size: Large)   Pulse 76   Temp 98 F (36.7 C) (Oral)   Resp 16   Ht 5\' 9"  (1.753 m)   Wt 269 lb (122 kg)   SpO2 96%   BMI 39.72 kg/m   BP Readings from Last 3 Encounters:  07/23/17 120/80  07/10/17 118/82  07/08/17 111/74    Wt Readings from Last 3 Encounters:  07/23/17 269 lb (122 kg)  07/10/17 255 lb 3.2 oz (115.8 kg)  07/08/17 255 lb (115.7 kg)    Physical Exam  Constitutional: She is oriented to person, place, and time. No distress.  HENT:  Mouth/Throat: Oropharynx is clear and moist. No oropharyngeal exudate.  Eyes: Conjunctivae are normal. Left eye exhibits no  discharge. No scleral icterus.  Neck: Normal range of motion. Neck supple. No JVD present. No thyromegaly present.  Cardiovascular: Normal rate, regular rhythm and normal heart sounds.  No murmur heard. Pulmonary/Chest: Effort normal and breath sounds normal. No respiratory distress. She has no wheezes. She has no rales.  Abdominal: Soft. Bowel sounds are normal. She exhibits no distension and no mass. There is no tenderness. There is no guarding.  Musculoskeletal: Normal range of motion. She exhibits no edema, tenderness or deformity.  Lymphadenopathy:    She has no cervical  adenopathy.  Neurological: She is alert and oriented to person, place, and time.  Skin: Skin is warm and dry. No rash noted. She is not diaphoretic. No erythema. No pallor.  Vitals reviewed.   Lab Results  Component Value Date   WBC 7.3 04/01/2017   HGB 13.3 04/01/2017   HCT 41.6 04/01/2017   PLT 294.0 04/01/2017   GLUCOSE 112 (H) 04/01/2017   CHOL 135 04/01/2017   TRIG 93.0 04/01/2017   HDL 38.30 (L) 04/01/2017   LDLCALC 78 04/01/2017   ALT 25 04/01/2017   AST 17 04/01/2017   NA 139 04/01/2017   K 4.0 04/01/2017   CL 105 04/01/2017   CREATININE 0.78 04/01/2017   BUN 11 04/01/2017   CO2 29 04/01/2017   TSH 1.21 04/01/2017   HGBA1C 6.3 04/01/2017    No results found.  Assessment & Plan:   Mary Sanford was seen today for hypertension.  Diagnoses and all orders for this visit:  Essential hypertension, benign- Her blood pressure is well controlled.  Electrolytes and renal function are normal. -     potassium chloride (K-DUR,KLOR-CON) 10 MEQ tablet; Take 1 tablet (10 mEq total) by mouth 2 (two) times daily.  Vitamin B12 deficiency anemia due to intrinsic factor deficiency -     Cyanocobalamin 500 MCG/0.1ML SOLN; Place 0.1 mLs (500 mcg total) into the nose once a week.  Obesity (BMI 30-39.9)- She has gained 14 pounds in the last few weeks.  Will start phentermine to reduce her caloric intake in addition to diet and exercise. -     phentermine (ADIPEX-P) 37.5 MG tablet; Take 1 tablet (37.5 mg total) by mouth daily before breakfast.   I have discontinued Mary Sanford. Mary Sanford CONTRAVE, ibuprofen, and potassium chloride. I have also changed her potassium chloride. Additionally, I am having her start on Cyanocobalamin and phentermine. Lastly, I am having her maintain her albuterol, escitalopram, hydrochlorothiazide, and acetaminophen. We will stop administering sodium chloride.  Meds ordered this encounter  Medications  . potassium chloride (K-DUR,KLOR-CON) 10 MEQ tablet    Sig: Take  1 tablet (10 mEq total) by mouth 2 (two) times daily.    Dispense:  180 tablet    Refill:  1  . Cyanocobalamin 500 MCG/0.1ML SOLN    Sig: Place 0.1 mLs (500 mcg total) into the nose once a week.    Dispense:  2.3 mL    Refill:  11  . phentermine (ADIPEX-P) 37.5 MG tablet    Sig: Take 1 tablet (37.5 mg total) by mouth daily before breakfast.    Dispense:  30 tablet    Refill:  2     Follow-up: Return in about 3 months (around 10/21/2017).  Scarlette Calico, MD

## 2017-07-23 NOTE — Patient Instructions (Signed)

## 2017-08-20 ENCOUNTER — Telehealth: Payer: Self-pay | Admitting: Internal Medicine

## 2017-08-20 NOTE — Telephone Encounter (Signed)
Weight loss form have been completed & faxed to [phone], sent to scan.  LVM to inform patient it is up front to pick up.

## 2017-08-26 ENCOUNTER — Ambulatory Visit: Payer: Self-pay

## 2017-08-26 ENCOUNTER — Ambulatory Visit: Payer: Federal, State, Local not specified - PPO | Admitting: Sports Medicine

## 2017-08-26 ENCOUNTER — Encounter: Payer: Self-pay | Admitting: Sports Medicine

## 2017-08-26 VITALS — BP 130/84 | HR 96 | Ht 69.0 in | Wt 249.0 lb

## 2017-08-26 DIAGNOSIS — M654 Radial styloid tenosynovitis [de Quervain]: Secondary | ICD-10-CM | POA: Diagnosis not present

## 2017-08-26 DIAGNOSIS — M1812 Unilateral primary osteoarthritis of first carpometacarpal joint, left hand: Secondary | ICD-10-CM

## 2017-08-26 DIAGNOSIS — M25532 Pain in left wrist: Secondary | ICD-10-CM | POA: Diagnosis not present

## 2017-08-26 NOTE — Progress Notes (Signed)
Juanda Bond. [name], [place] [facility] [place]  [name] - 52 y.o. female MRN [id number]  Date of birth: [birth year]  Visit Date: 08/26/2017  PCP: [name], MD   Referred by: [name], MD   Scribe for today's visit: Josepha Pigg, CMA     SUBJECTIVE:  Mary Sanford is here for Follow-up (arthritis CMC joint, LT thumb, De Quarvains tenosynovitis)  Compared to the last office visit, her previously described symptoms of LT thumb pain are: are improving, shooting pain with certain movements and pain when picking up heavy object. She continues to have some swelling in the joint.  Current symptoms are moderate & are nonradiating She had steroid injection 01/23/17 and again 07/10/17. She has been taking Acetaminophen prn, icing the area, using compression, and wearing wrist brace with minimal relief.    ROS Denies night time disturbances. Denies fevers, chills, or night sweats. Denies unexplained weight loss. Denies personal history of cancer. Denies changes in bowel or bladder habits. Denies recent unreported falls. Denies new or worsening dyspnea or wheezing. Denies headaches or dizziness.  Reports numbness, tingling or weakness  In the extremities.  Denies dizziness or presyncopal episodes Denies lower extremity edema     HISTORY & PERTINENT PRIOR DATA:  Prior History reviewed and updated per electronic medical record.  Significant history, findings, studies and interim changes include:  reports that she quit smoking about 18 years ago. Her smoking use included cigarettes. she has never used smokeless tobacco. Recent Labs    04/01/17 1005  HGBA1C 6.3   No specialty comments available. No problems updated.  OBJECTIVE:  VS:  HT:5\' 9"  (175.3 cm)   WT:249 lb (112.9 kg)  BMI:36.75    BP:130/84  HR:96bpm  TEMP: ( )  RESP:96 %   PHYSICAL EXAM: Constitutional: WDWN, Non-toxic appearing. Psychiatric:  Alert & appropriately interactive.  Not depressed or anxious appearing. Respiratory: No increased work of breathing.  Trachea Midline Eyes: Pupils are equal.  EOM intact without nystagmus.  No scleral icterus  NEUROVASCULAR exam: No clubbing or cyanosis appreciated No significant venous stasis changes Capillary Refill: normal, less than 2 seconds   Left hand has a small amount of swelling but this is minimal.  Pain is most significant over the dorsal wrist and worse with Wynn Maudlin testing but this does seem to localize to the [facility] joint.     ASSESSMENT & PLAN:   1. De Quervain's tenosynovitis, left   2. Arthritis of carpometacarpal (CMC) joint of left thumb   3. Left wrist pain    PLAN: Patient has multifactorial wrist pain.  The localized [place] pain that she was having previously has significantly improved but continues to have dorsal sided pain with poor tendon motion on ultrasound.  Repeat first dorsal compartment injection performed today. If any lack of improvement will need MRI plus minus surgical consultation  ++++++++++++++++++++++++++++++++++++++++++++ Orders & Meds: Orders Placed This Encounter  Procedures  . [place] LIMITED JOINT SPACE STRUCTURES UP LEFT(NO LINKED CHARGES)    No orders of the defined types were placed in this encounter.   ++++++++++++++++++++++++++++++++++++++++++++ Follow-up: Return in about 6 weeks (around 10/07/2017).   Pertinent documentation may be included in additional procedure notes, imaging studies, problem based documentation and patient instructions. Please see these sections of the encounter for additional information regarding this visit. CMA/ATC served as Education administrator during this visit. History, Physical, and Plan performed by medical provider. Documentation and orders reviewed and  attested to.      Gerda Diss, [place] [facility] Physician

## 2017-08-26 NOTE — Patient Instructions (Addendum)
CALL DR RIGBY IN 2 WEEKS WITH AND UPDATE :)   You had an injection today.  Things to be aware of after injection are listed below: . You may experience no significant improvement or even a slight worsening in your symptoms during the first 24 to 48 hours.  After that we expect your symptoms to improve gradually over the next 2 weeks for the medicine to have its maximal effect.  You should continue to have improvement out to 6 weeks after your injection. . Dr. Paulla Fore recommends icing the site of the injection for 20 minutes  1-2 times the day of your injection . You may shower but no swimming, tub bath or Jacuzzi for 24 hours. . If your bandage falls off this does not need to be replaced.  It is appropriate to remove the bandage after 4 hours. . You may resume light activities as tolerated unless otherwise directed per Dr. Paulla Fore during your visit  POSSIBLE STEROID SIDE EFFECTS:  Side effects from injectable steroids tend to be less than when taken orally however you may experience some of the symptoms listed below.  If experienced these should only last for a short period of time. Change in menstrual flow  Edema (swelling)  Increased appetite Skin flushing (redness)  Skin rash/acne  Thrush (oral) Yeast vaginitis    Increased sweating  Depression Increased blood glucose levels Cramping and leg/calf  Euphoria (feeling happy)  POSSIBLE PROCEDURE SIDE EFFECTS: The side effects of the injection are usually fairly minimal however if you may experience some of the following side effects that are usually self-limited and will is off on their own.  If you are concerned please feel free to call the office with questions:  Increased numbness or tingling  Nausea or vomiting  Swelling or bruising at the injection site   Please call our office if if you experience any of the following symptoms over the next week as these can be signs of infection:   Fever greater than 100.24F  Significant swelling at the  injection site  Significant redness or drainage from the injection site  If after 2 weeks you are continuing to have worsening symptoms please call our office to discuss what the next appropriate actions should be including the potential for a return office visit or other diagnostic testing.

## 2017-08-26 NOTE — Procedures (Signed)
PROCEDURE NOTE:  Ultrasound Guided: Injection: Left 1st dorsal compartment tendon sheath injection Images were obtained and interpreted by myself, Teresa Coombs, DO  Images have been saved and stored to PACS system. Images obtained on: GE S7 Ultrasound machine  ULTRASOUND FINDINGS:  Stenosis of the first dorsal compartment with poor tendon mobility  DESCRIPTION OF PROCEDURE:  The patient's clinical condition is marked by substantial pain and/or significant functional disability. Other conservative therapy has not provided relief, is contraindicated, or not appropriate. There is a reasonable likelihood that injection will significantly improve the patient's pain and/or functional impairment.  After discussing the risks, benefits and expected outcomes of the injection and all questions were reviewed and answered, the patient wished to undergo the above named procedure. Verbal consent was obtained.  The ultrasound was used to identify the target structure and adjacent neurovascular structures. The skin was then prepped in sterile fashion and the target structure was injected under direct visualization using sterile technique as below:  PREP: Alcohol, Ethel Chloride, 1cc 1% lidocaine on Insulin Needle APPROACH: direct, single injection, 25g 1.5in. INJECTATE: 0.5 cc 1% lidocaine, 0.5cc 0.5% marcaine, 0.5cc 40mg /mL DepoMedrol   ASPIRATE: None   DRESSING: Band-Aid    Post procedural instructions including recommending icing and warning signs for infection were reviewed.  This procedure was well tolerated and there were no complications.   IMPRESSION: Succesful Ultrasound Guided: Injection

## 2017-09-04 ENCOUNTER — Telehealth: Payer: Self-pay | Admitting: Internal Medicine

## 2017-09-04 NOTE — Telephone Encounter (Signed)
Copied from [place] [id number]. Topic: Inquiry >> Sep 04, 2017  3:25 PM Mot[place], Claiborne [place], [place] wrote: Reason for CRM: Patient calling because she would like to know if Dr.Jones could give her more of the b12 samples that was given to her or was he going to call her in a prescription for the B12. Also wanted to know if it would be best if she just came into the office to get a B12 shot. If someone could give her a call back about this at [phone]

## 2017-09-07 ENCOUNTER — Ambulatory Visit: Payer: Federal, State, Local not specified - PPO

## 2017-09-07 ENCOUNTER — Other Ambulatory Visit: Payer: Self-pay | Admitting: Internal Medicine

## 2017-09-07 DIAGNOSIS — D510 Vitamin B12 deficiency anemia due to intrinsic factor deficiency: Secondary | ICD-10-CM

## 2017-09-07 MED ORDER — CYANOCOBALAMIN 500 MCG/0.1ML NA SOLN: 0.1000 mL | NASAL | 11 refills | Status: DC

## 2017-09-07 NOTE — Telephone Encounter (Signed)
Pt is requesting to do the b12 injections instead of the nasal spray. Please advise if you approve.

## 2017-09-07 NOTE — Telephone Encounter (Signed)
RX sent I also have samples

## 2017-09-08 ENCOUNTER — Ambulatory Visit (INDEPENDENT_AMBULATORY_CARE_PROVIDER_SITE_OTHER): Payer: Federal, State, Local not specified - PPO | Admitting: *Deleted

## 2017-09-08 DIAGNOSIS — E538 Deficiency of other specified B group vitamins: Secondary | ICD-10-CM | POA: Diagnosis not present

## 2017-09-08 MED ORDER — CYANOCOBALAMIN 1000 MCG/ML IJ SOLN
1000.0000 ug | Freq: Once | INTRAMUSCULAR | Status: AC
  Administered 2017-09-08: 1000 ug via INTRAMUSCULAR

## 2017-09-10 DIAGNOSIS — S93401A Sprain of unspecified ligament of right ankle, initial encounter: Secondary | ICD-10-CM | POA: Diagnosis not present

## 2017-09-14 ENCOUNTER — Emergency Department (HOSPITAL_COMMUNITY)
Admission: EM | Admit: 2017-09-14 | Discharge: 2017-09-15 | Disposition: A | Discharge status: Home / Self Care | Payer: Federal, State, Local not specified - PPO | Attending: Emergency Medicine | Admitting: Emergency Medicine

## 2017-09-14 ENCOUNTER — Other Ambulatory Visit: Payer: Self-pay

## 2017-09-14 ENCOUNTER — Encounter (HOSPITAL_COMMUNITY): Payer: Self-pay | Admitting: *Deleted

## 2017-09-14 DIAGNOSIS — R45851 Suicidal ideations: Secondary | ICD-10-CM | POA: Diagnosis not present

## 2017-09-14 DIAGNOSIS — F332 Major depressive disorder, recurrent severe without psychotic features: Secondary | ICD-10-CM | POA: Diagnosis not present

## 2017-09-14 DIAGNOSIS — F4325 Adjustment disorder with mixed disturbance of emotions and conduct: Secondary | ICD-10-CM | POA: Diagnosis not present

## 2017-09-14 DIAGNOSIS — Z79899 Other long term (current) drug therapy: Secondary | ICD-10-CM | POA: Diagnosis not present

## 2017-09-14 DIAGNOSIS — F29 Unspecified psychosis not due to a substance or known physiological condition: Secondary | ICD-10-CM | POA: Diagnosis not present

## 2017-09-14 DIAGNOSIS — I10 Essential (primary) hypertension: Secondary | ICD-10-CM | POA: Diagnosis not present

## 2017-09-14 DIAGNOSIS — R51 Headache: Secondary | ICD-10-CM | POA: Diagnosis not present

## 2017-09-14 LAB — COMPREHENSIVE METABOLIC PANEL
ALK PHOS: 74 U/L (ref 38–126)
ALT: 34 U/L (ref 14–54)
ANION GAP: 13 (ref 5–15)
AST: 27 U/L (ref 15–41)
Albumin: 3.8 g/dL (ref 3.5–5.0)
BUN: 11 mg/dL (ref 6–20)
CALCIUM: 10.2 mg/dL (ref 8.9–10.3)
CO2: 24 mmol/L (ref 22–32)
CREATININE: 0.86 mg/dL (ref 0.44–1.00)
Chloride: 102 mmol/L (ref 101–111)
GFR calc Af Amer: >60 mL/min (ref >60)
Glucose, Bld: 123 mg/dL — ABNORMAL HIGH (ref 65–99)
Potassium: 3.4 mmol/L — ABNORMAL LOW (ref 3.5–5.1)
SODIUM: 139 mmol/L (ref 135–145)
Total Bilirubin: 0.2 mg/dL — ABNORMAL LOW (ref 0.3–1.2)
Total Protein: 7.2 g/dL (ref 6.5–8.1)

## 2017-09-14 LAB — URINALYSIS, ROUTINE W REFLEX MICROSCOPIC
Bilirubin Urine: NEGATIVE
GLUCOSE, UA: NEGATIVE mg/dL
Hgb urine dipstick: NEGATIVE
Ketones, ur: NEGATIVE mg/dL
LEUKOCYTES UA: NEGATIVE
Nitrite: NEGATIVE
PH: 7 (ref 5.0–8.0)
Protein, ur: NEGATIVE mg/dL
Specific Gravity, Urine: 1.013 (ref 1.005–1.030)

## 2017-09-14 LAB — SALICYLATE LEVEL: Salicylate Lvl: <7 mg/dL (ref 2.8–30.0)

## 2017-09-14 LAB — RAPID URINE DRUG SCREEN, HOSP PERFORMED
AMPHETAMINES: NOT DETECTED
BARBITURATES: NOT DETECTED
BENZODIAZEPINES: NOT DETECTED
Cocaine: NOT DETECTED
Opiates: NOT DETECTED
Tetrahydrocannabinol: NOT DETECTED

## 2017-09-14 LAB — CBC WITH DIFFERENTIAL/PLATELET
Basophils Absolute: 0 10*3/uL (ref 0.0–0.1)
Basophils Relative: 0 %
EOS PCT: 1 %
Eosinophils Absolute: 0.1 10*3/uL (ref 0.0–0.7)
HEMATOCRIT: 42 % (ref 36.0–46.0)
Hemoglobin: 13.8 g/dL (ref 12.0–15.0)
Lymphocytes Relative: 30 %
Lymphs Abs: 2.3 10*3/uL (ref 0.7–4.0)
MCH: 23.5 pg — ABNORMAL LOW (ref 26.0–34.0)
MCHC: 32.9 g/dL (ref 30.0–36.0)
MCV: 71.4 fL — AB (ref 78.0–100.0)
MONOS PCT: 13 %
Monocytes Absolute: 1 10*3/uL (ref 0.1–1.0)
Neutro Abs: 4.2 10*3/uL (ref 1.7–7.7)
Neutrophils Relative %: 56 %
Platelets: 287 10*3/uL (ref 150–400)
RBC: 5.88 MIL/uL — ABNORMAL HIGH (ref 3.87–5.11)
RDW: 14 % (ref 11.5–15.5)
WBC: 7.6 10*3/uL (ref 4.0–10.5)

## 2017-09-14 LAB — ACETAMINOPHEN LEVEL

## 2017-09-14 LAB — I-STAT BETA HCG BLOOD, ED (MC, WL, AP ONLY)

## 2017-09-14 LAB — ETHANOL: Alcohol, Ethyl (B): <10 mg/dL (ref <10)

## 2017-09-14 MED ORDER — ACETAMINOPHEN 500 MG PO TABS: 500.0000 mg | ORAL_TABLET | Freq: Four times a day (QID) | ORAL | Status: DC | PRN

## 2017-09-14 MED ORDER — LORAZEPAM 0.5 MG PO TABS
0.5000 mg | ORAL_TABLET | Freq: Once | ORAL | Status: AC
  Administered 2017-09-14: 0.5 mg via ORAL
  Filled 2017-09-14 (×2): qty 1

## 2017-09-14 MED ORDER — ALBUTEROL SULFATE HFA 108 (90 BASE) MCG/ACT IN AERS: 1.0000 | INHALATION_SPRAY | Freq: Four times a day (QID) | RESPIRATORY_TRACT | Status: DC | PRN

## 2017-09-14 MED ORDER — ACETAMINOPHEN 325 MG PO TABS
650.0000 mg | ORAL_TABLET | Freq: Once | ORAL | Status: AC
  Administered 2017-09-14: 650 mg via ORAL
  Filled 2017-09-14: qty 2

## 2017-09-14 MED ORDER — ESCITALOPRAM OXALATE 10 MG PO TABS
10.0000 mg | ORAL_TABLET | Freq: Every day | ORAL | Status: DC
  Administered 2017-09-14 – 2017-09-15 (×2): 10 mg via ORAL
  Filled 2017-09-14 (×2): qty 1

## 2017-09-14 MED ORDER — HYDROCHLOROTHIAZIDE 25 MG PO TABS
25.0000 mg | ORAL_TABLET | Freq: Every day | ORAL | Status: DC
  Administered 2017-09-15: 25 mg via ORAL
  Filled 2017-09-14: qty 1

## 2017-09-14 NOTE — ED Provider Notes (Signed)
[facility] DEPT Provider Note   CSN: [id number] Arrival date & time: 09/14/17  1709     History   Chief Complaint Chief Complaint  Patient presents with  . Suicidal    HPI Mary Sanford is a 52 y.o. female with past medical history significant for depression, anemia, anxiety, hypertension presenting with law enforcement after threatening to kill herself by slicing her wrist or taking pills and alcohol simultaneously.  Patient explains that she had a fight with her daughter and became overwhelmed and stated that she would end her life.  She reports now regretting saying this and does not endorse any intentions to harm herself.  Has a history of depression and is currently on Lexapro and reports being compliant.  Her husband contacted Event organiser and is in the process of obtaining IVC paperwork.  Patient denies any injury, trauma.  No complaints other than mild headache.  HPI  Past Medical History:  Diagnosis Date  . Allergy   . Anemia   . Anxiety   . Arthritis    arm/ wrist areas  . B12 deficiency   . Constipation    not chronic- now stools soft and regular   . Cough    non- productive  . Depression    no meds  . Hypertension   . Kidney stones     Patient Active Problem List   Diagnosis Date Noted  . Arthritis of carpometacarpal [facility]) joint of right thumb 09/22/2016  . ASCUS pap with negative high risk HPV on 06/30/16 06/30/2016  . Arthritis of carpometacarpal [facility]) joint of left thumb 05/06/2016  . Anxiety and depression 12/28/2015  . De Quervain's tenosynovitis, left 06/21/2015  . Prediabetes 11/02/2014  . Anemia, iron deficiency 09/17/2013  . Routine general medical examination at a health care facility 01/12/2013  . Allergic rhinitis 01/12/2013  . Constipation, chronic 10/04/2012  . Visit for screening mammogram 01/12/2012  . B12 deficiency anemia 01/09/2012  . Obesity (BMI 30-39.9) 01/09/2012  . Essential hypertension,  benign 11/24/2011    Past Surgical History:  Procedure Laterality Date  . caesarean section    . cystoscopic     extraction of ureteric calculus without disintegration  . DILITATION & CURRETTAGE/HYSTROSCOPY WITH NOVASURE ABLATION N/A 06/17/2013   Procedure: DILATATION & CURETTAGE/HYSTEROSCOPY WITH NOVASURE ABLATION;  Surgeon: Osborne Oman, MD;  Location: [facility];  Service: Gynecology;  Laterality: N/A;  . HEMORRHOIDECTOMY WITH HEMORRHOID BANDING  10/19/2012   SCA THD Hem ligation/pexy  . TUBAL LIGATION    . WISDOM TOOTH EXTRACTION      OB History    Gravida Para Term Preterm AB Living   7 5 4 1 2 5    SAB TAB Ectopic Multiple Live Births   0 2 0 0 5       Home Medications    Prior to Admission medications   Medication Sig Start Date End Date Taking? Authorizing Provider  acetaminophen (TYLENOL) 500 MG tablet Take 500 mg every 6 (six) hours as needed by mouth.    [provider]  albuterol (PROVENTIL HFA;VENTOLIN HFA) 108 (90 Base) MCG/ACT inhaler Inhale 1-2 puffs into the lungs every 6 (six) hours as needed for wheezing or shortness of breath. 10/24/16   Nche, Charlene Brooke, NP  Cyanocobalamin 500 MCG/0.1ML SOLN Place 0.1 mLs (500 mcg total) into the nose once a week. 09/07/17   [name], MD  escitalopram (LEXAPRO) 10 MG tablet  01/28/17   [provider]  hydrochlorothiazide (  HYDRODIURIL) 25 MG tablet  03/16/17   [provider]  phentermine (ADIPEX-P) 37.5 MG tablet Take 1 tablet (37.5 mg total) by mouth daily before breakfast. 07/23/17   [name], MD  potassium chloride (K-DUR,KLOR-CON) 10 MEQ tablet Take 1 tablet (10 mEq total) by mouth 2 (two) times daily. 07/23/17   [name], MD  potassium chloride (K-DUR) 10 MEQ tablet Take 1 tablet (10 mEq total) by mouth 2 (two) times daily. 05/13/12 06/27/13  [name], MD    Family History Family History  Problem Relation Age of Onset  . Hypertension Mother   . Hypothyroidism  Mother   . Asthma Mother   . Cancer Mother 27       Colon and rectal  . Rectal cancer Mother 58  . Colon cancer Mother 62  . Hypotension Sister   . Hypothyroidism Sister   . Diabetes Sister   . Seizures Brother   . Hypothyroidism Sister   . Seizures Sister   . Anemia Sister   . Asthma Unknown   . Hypothyroidism Unknown   . Heart disease Paternal Grandfather        >55  . Stroke Maternal Uncle        > 55  . Deep vein thrombosis Sister   . Early death Neg Hx   . Hyperlipidemia Neg Hx   . Kidney disease Neg Hx   . Colon polyps Neg Hx   . Esophageal cancer Neg Hx   . Stomach cancer Neg Hx     Social History Social History   Tobacco Use  . Smoking status: Former Smoker    Types: Cigarettes    Last attempt to quit: 08/05/1999    Years since quitting: 18.1  . Smokeless tobacco: Never Used  Substance Use Topics  . Alcohol use: Yes    Comment: socially  . Drug use: No     Allergies   Naproxen   Review of Systems Review of Systems  Constitutional: Negative for chills, diaphoresis, fatigue and fever.  HENT: Negative for congestion, ear pain, sore throat, tinnitus, trouble swallowing and voice change.   Eyes: Negative for photophobia, pain, redness and visual disturbance.  Respiratory: Negative for cough, choking, chest tightness, shortness of breath, wheezing and stridor.   Cardiovascular: Negative for chest pain, palpitations and leg swelling.  Gastrointestinal: Negative for abdominal distention, abdominal pain, diarrhea, nausea and vomiting.  Genitourinary: Negative for difficulty urinating, dysuria, enuresis, flank pain, frequency and hematuria.  Musculoskeletal: Negative for arthralgias, back pain, gait problem, joint swelling, myalgias, neck pain and neck stiffness.  Skin: Negative for color change, pallor and rash.  Neurological: Positive for headaches. Negative for dizziness, tremors, seizures, syncope, facial asymmetry, speech difficulty, weakness,  light-headedness and numbness.     Physical Exam Updated Vital Signs BP (!) 164/110   Pulse (!) 105   Temp 98.3 F (36.8 C) (Oral)   Resp 18   SpO2 97%   Physical Exam  Constitutional: She appears well-developed and well-nourished. No distress.  Afebrile, well-appearing, sitting comfortably in bed no acute distress.  HENT:  Head: Normocephalic and atraumatic.  Eyes: Conjunctivae and EOM are normal. Right eye exhibits no discharge. Left eye exhibits no discharge.  Neck: Normal range of motion.  Cardiovascular: Normal rate, regular rhythm and normal heart sounds.  No murmur heard. Pulmonary/Chest: Effort normal and breath sounds normal. No stridor. No respiratory distress. She has no wheezes. She has no rales.  Abdominal: She exhibits no distension.  Musculoskeletal:  Normal range of motion. She exhibits no edema or deformity.  Neurological: She is alert. No cranial nerve deficit or sensory deficit. She exhibits normal muscle tone.  Skin: Skin is warm and dry. No rash noted. She is not diaphoretic. No erythema. No pallor.  Psychiatric: She has a normal mood and affect.  Nursing note and vitals reviewed.    ED Treatments / Results  Labs (all labs ordered are listed, but only abnormal results are displayed) Labs Reviewed  ACETAMINOPHEN LEVEL - Abnormal; Notable for the following components:      Result Value   Acetaminophen (Tylenol), Serum <10 (*)    All other components within normal limits  COMPREHENSIVE METABOLIC PANEL - Abnormal; Notable for the following components:   Potassium 3.4 (*)    Glucose, Bld 123 (*)    Total Bilirubin 0.2 (*)    All other components within normal limits  CBC WITH DIFFERENTIAL/PLATELET - Abnormal; Notable for the following components:   RBC 5.88 (*)    MCV 71.4 (*)    MCH 23.5 (*)    All other components within normal limits  SALICYLATE LEVEL  URINALYSIS, ROUTINE W REFLEX MICROSCOPIC  RAPID URINE DRUG SCREEN, HOSP PERFORMED  ETHANOL    I-STAT BETA HCG BLOOD, ED ([facility], [facility], [facility] ONLY)    EKG  EKG Interpretation None       Radiology No results found.  Procedures Procedures (including critical care time)  Medications Ordered in ED Medications  LORazepam (ATIVAN) tablet 0.5 mg (not administered)  acetaminophen (TYLENOL) tablet 650 mg (not administered)  acetaminophen (TYLENOL) tablet 500 mg (not administered)  hydrochlorothiazide (HYDRODIURIL) tablet 25 mg (not administered)  escitalopram (LEXAPRO) tablet 10 mg (not administered)  albuterol (PROVENTIL HFA;VENTOLIN HFA) 108 (90 Base) MCG/ACT inhaler 1-2 puff (not administered)     Initial Impression / Assessment and Plan / ED Course  I have reviewed the triage vital signs and the nursing notes.  Pertinent labs & imaging results that were available during my care of the patient were reviewed by me and considered in my medical decision making (see chart for details).    Patient presenting with law enforcement after making threats to kill herself and stating multiple plan options. Denies any complaints.  Is well-appearing, nontoxic afebrile.  Patient's husband is in the process of obtaining IVC paperwork Ordered labs for medical clearance and consulted TTS. Labs unremarkable TTS recommending overnight observation and reevaluation in the morning.  Home meds ordered Final Clinical Impressions(s) / ED Diagnoses   Final diagnoses:  Suicidal ideation    ED Discharge Orders    None       Dossie Der 09/14/17 1911    Quintella Reichert, MD 09/17/17 [phone]
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
## 2017-09-14 NOTE — ED Notes (Signed)
TTS at bedside speaking with patient.  

## 2017-09-14 NOTE — ED Triage Notes (Signed)
Per EMS, pt was having an argument with her daughter when she told her daughter she was going to harm herself.  Pt's daughter called 911. Pt told police she was suicidal and that she would slit her wrists. Pt told EMS she was not suicidal. Police state the pt's husband is going to take out IVC paperwork.

## 2017-09-14 NOTE — ED Notes (Signed)
Patient reports self harm with a plan to cut her wrists or overdose. Patient denies HI/AVH at this time. Sandwich given and soda. Plan of care discussed. Encouragement and support provided and safety maintain. Q 15 min safety checks in place and video monitoring.

## 2017-09-14 NOTE — BH Assessment (Signed)
[facility] Assessment Progress Note  Case was staffed with Romilda Garret FNP who recommended patient be observed for safety and monitored. Patient will be seen by psychiatry in the a.m.

## 2017-09-14 NOTE — ED Notes (Signed)
EDPA Provider at bedside. 

## 2017-09-14 NOTE — ED Notes (Signed)
DOCUMENTATION ERROR GIVEN BY Sonji Starkes RN

## 2017-09-15 ENCOUNTER — Ambulatory Visit: Payer: Federal, State, Local not specified - PPO

## 2017-09-15 DIAGNOSIS — F4325 Adjustment disorder with mixed disturbance of emotions and conduct: Secondary | ICD-10-CM

## 2017-09-15 DIAGNOSIS — R45851 Suicidal ideations: Secondary | ICD-10-CM

## 2017-09-15 NOTE — Consult Note (Signed)
[facility] Consult   Reason for Consult:  Suicidal Ideation Referring Physician:  EDP Patient Identification: Mary Sanford MRN:  [id number] Principal Diagnosis: Adjustment disorder with mixed disturbance of emotions and conduct Diagnosis:   Patient Active Problem List   Diagnosis Date Noted  . Suicidal ideation [R45.851] 09/15/2017  . Adjustment disorder with mixed disturbance of emotions and conduct [F43.25] 09/15/2017  . Arthritis of carpometacarpal [facility]) joint of right thumb [M18.11] 09/22/2016  . ASCUS pap with negative high risk HPV on 06/30/16 [R87.610] 06/30/2016  . Arthritis of carpometacarpal [facility]) joint of left thumb [M18.12] 05/06/2016  . Anxiety and depression [F41.9, F32.9] 12/28/2015  . De Quervain's tenosynovitis, left [M65.4] 06/21/2015  . Prediabetes [R73.03] 11/02/2014  . Anemia, iron deficiency [D50.9] 09/17/2013  . Routine general medical examination at a health care facility [Z00.00] 01/12/2013  . Allergic rhinitis [J30.9] 01/12/2013  . Constipation, chronic [K59.09] 10/04/2012  . Visit for screening mammogram [Z12.31] 01/12/2012  . B12 deficiency anemia [D51.9] 01/09/2012  . Obesity (BMI 30-39.9) [E66.9] 01/09/2012  . Essential hypertension, benign [I10] 11/24/2011    Total Time spent with patient: 45 minutes  Subjective:   Mary Sanford is a 52 y.o. female patient admitted with suicidal ideation.  HPI:  Pt was seen and chart reviewed with treatment team and Dr Mariea Clonts. Pt stated she got into an argument with her daughter and said she was going to kill herself by slitting her wrists.  Pt stated she regrets that she said this and stated she was just overwhelmed with stress at work and at home. Pt stated she lives with her husband and two daughters and a brand new grandbaby. Pt stated she feels safe at home and is able to contract for safety. Pt's UDS and BAL negative. Pt stated she will follow up with [employer] through her work and is in agreement  that she needs to see a therapist to help with her stress. Pt is psychiatrically clear for discharge.   Past Psychiatric History: As above.   Risk to Self: None Risk to Others: None Prior Inpatient Therapy: Prior Inpatient Therapy: No Prior Therapy Dates: NA Prior Therapy Facilty/Provider(s): NA Reason for Treatment: NA Prior Outpatient Therapy: Prior Outpatient Therapy: Yes Prior Therapy Dates: Ongoing Prior Therapy Facilty/Provider(s): [facility] Reason for Treatment: Med mang Does patient have an ACCT team?: No Does patient have Intensive In-House Services?  : No Does patient have [facility] services? : No Does patient have P4CC services?: No  Past Medical History:  Past Medical History:  Diagnosis Date  . Allergy   . Anemia   . Anxiety   . Arthritis    arm/ wrist areas  . B12 deficiency   . Constipation    not chronic- now stools soft and regular   . Cough    non- productive  . Depression    no meds  . Hypertension   . Kidney stones     Past Surgical History:  Procedure Laterality Date  . caesarean section    . cystoscopic     extraction of ureteric calculus without disintegration  . DILITATION & CURRETTAGE/HYSTROSCOPY WITH NOVASURE ABLATION N/A 06/17/2013   Procedure: DILATATION & CURETTAGE/HYSTEROSCOPY WITH NOVASURE ABLATION;  Surgeon: Osborne Oman, MD;  Location: [facility];  Service: Gynecology;  Laterality: N/A;  . HEMORRHOIDECTOMY WITH HEMORRHOID BANDING  10/19/2012   SCA THD Hem ligation/pexy  . TUBAL LIGATION    . WISDOM TOOTH EXTRACTION     Family History:  Family History  Problem Relation Age of Onset  . Hypertension Mother   . Hypothyroidism Mother   . Asthma Mother   . Cancer Mother 73       Colon and rectal  . Rectal cancer Mother 61  . Colon cancer Mother 92  . Hypotension Sister   . Hypothyroidism Sister   . Diabetes Sister   . Seizures Brother   . Hypothyroidism Sister   . Seizures Sister   . Anemia Sister   . Asthma Unknown   .  Hypothyroidism Unknown   . Heart disease Paternal Grandfather        >55  . Stroke Maternal Uncle        > 55  . Deep vein thrombosis Sister   . Early death Neg Hx   . Hyperlipidemia Neg Hx   . Kidney disease Neg Hx   . Colon polyps Neg Hx   . Esophageal cancer Neg Hx   . Stomach cancer Neg Hx    Family Psychiatric  History: Unknown Social History:  Social History   Substance and Sexual Activity  Alcohol Use Yes   Comment: socially     Social History   Substance and Sexual Activity  Drug Use No    Social History   Socioeconomic History  . Marital status: Married    Spouse name: None  . Number of children: None  . Years of education: None  . Highest education level: None  Social Needs  . Financial resource strain: None  . Food insecurity - worry: None  . Food insecurity - inability: None  . Transportation needs - medical: None  . Transportation needs - non-medical: None  Occupational History  . Occupation: minister  Tobacco Use  . Smoking status: Former Smoker    Types: Cigarettes    Last attempt to quit: 08/05/1999    Years since quitting: 18.1  . Smokeless tobacco: Never Used  Substance and Sexual Activity  . Alcohol use: Yes    Comment: socially  . Drug use: No  . Sexual activity: None  Other Topics Concern  . None  Social History Narrative  . None   Additional Social History: N/A    Allergies:   Allergies  Allergen Reactions  . Naproxen     Panic attacks    Labs:  Results for orders placed or performed during the hospital encounter of 09/14/17 (from the past 48 hour(s))  Acetaminophen level     Status: Abnormal   Collection Time: 09/14/17  5:35 PM  Result Value Ref Range   Acetaminophen (Tylenol), Serum <10 (L) 10 - 30 ug/mL    Comment:        THERAPEUTIC CONCENTRATIONS VARY SIGNIFICANTLY. A RANGE OF 10-30 ug/mL MAY BE AN EFFECTIVE CONCENTRATION FOR MANY PATIENTS. HOWEVER, SOME ARE BEST TREATED AT CONCENTRATIONS OUTSIDE  THIS RANGE. ACETAMINOPHEN CONCENTRATIONS >150 ug/mL AT 4 HOURS AFTER INGESTION AND >50 ug/mL AT 12 HOURS AFTER INGESTION ARE OFTEN ASSOCIATED WITH TOXIC REACTIONS. Performed at [facility], [place] [address] [place], [place] [address]   Comprehensive metabolic panel     Status: Abnormal   Collection Time: 09/14/17  5:35 PM  Result Value Ref Range   Sodium 139 135 - 145 mmol/L   Potassium 3.4 (L) 3.5 - 5.1 mmol/L   Chloride 102 101 - 111 mmol/L   CO2 24 22 - 32 mmol/L   Glucose, Bld 123 (H) 65 - 99 mg/dL   BUN 11 6 - 20 mg/dL   Creatinine, Ser  0.86 0.44 - 1.00 mg/dL   Calcium 10.2 8.9 - 10.3 mg/dL   Total Protein 7.2 6.5 - 8.1 g/dL   Albumin 3.8 3.5 - 5.0 g/dL   AST 27 15 - 41 U/L   ALT 34 14 - 54 U/L   Alkaline Phosphatase 74 38 - 126 U/L   Total Bilirubin 0.2 (L) 0.3 - 1.2 mg/dL   GFR calc non Af Amer >60 >60 mL/min   GFR calc Af Amer >60 >60 mL/min    Comment: (NOTE) The eGFR has been calculated using the CKD EPI equation. This calculation has not been validated in all clinical situations. eGFR's persistently <60 mL/min signify possible Chronic Kidney Disease.    Anion gap 13 5 - 15    Comment: Performed at [facility], [place] [address] [place], [place] [address]  Salicylate level     Status: None   Collection Time: 09/14/17  5:35 PM  Result Value Ref Range   Salicylate Lvl <1.6 2.8 - 30.0 mg/dL    Comment: Performed at [facility], [place] [address] [place], [place] [address]  CBC with Differential     Status: Abnormal   Collection Time: 09/14/17  5:35 PM  Result Value Ref Range   WBC 7.6 4.0 - 10.5 K/uL   RBC 5.88 (H) 3.87 - 5.11 MIL/uL   Hemoglobin 13.8 12.0 - 15.0 g/dL   HCT 42.0 36.0 - 46.0 %   MCV 71.4 (L) 78.0 - 100.0 fL   MCH 23.5 (L) 26.0 - 34.0 pg   MCHC 32.9 30.0 - 36.0 g/dL   RDW 14.0 11.5 - 15.5 %   Platelets 287 150 - 400 K/uL   Neutrophils Relative % 56 %   Lymphocytes Relative 30 %    Monocytes Relative 13 %   Eosinophils Relative 1 %   Basophils Relative 0 %   Neutro Abs 4.2 1.7 - 7.7 K/uL   Lymphs Abs 2.3 0.7 - 4.0 K/uL   Monocytes Absolute 1.0 0.1 - 1.0 K/uL   Eosinophils Absolute 0.1 0.0 - 0.7 K/uL   Basophils Absolute 0.0 0.0 - 0.1 K/uL   Smear Review MORPHOLOGY UNREMARKABLE     Comment: Performed at [facility], [place] [address] [place], [place] [address]  Urinalysis, Routine w reflex microscopic     Status: None   Collection Time: 09/14/17  5:35 PM  Result Value Ref Range   Color, Urine YELLOW YELLOW   APPearance CLEAR CLEAR   Specific Gravity, Urine 1.013 1.005 - 1.030   pH 7.0 5.0 - 8.0   Glucose, UA NEGATIVE NEGATIVE mg/dL   Hgb urine dipstick NEGATIVE NEGATIVE   Bilirubin Urine NEGATIVE NEGATIVE   Ketones, ur NEGATIVE NEGATIVE mg/dL   Protein, ur NEGATIVE NEGATIVE mg/dL   Nitrite NEGATIVE NEGATIVE   Leukocytes, UA NEGATIVE NEGATIVE    Comment: Performed at [place] [address], [place], [place] [address]  Urine rapid drug screen (hosp performed)     Status: None   Collection Time: 09/14/17  5:35 PM  Result Value Ref Range   Opiates NONE DETECTED NONE DETECTED   Cocaine NONE DETECTED NONE DETECTED   Benzodiazepines NONE DETECTED NONE DETECTED   Amphetamines NONE DETECTED NONE DETECTED   Tetrahydrocannabinol NONE DETECTED NONE DETECTED   Barbiturates NONE DETECTED NONE DETECTED    Comment: (NOTE) DRUG SCREEN FOR MEDICAL PURPOSES ONLY.  IF CONFIRMATION IS NEEDED FOR ANY PURPOSE, NOTIFY LAB WITHIN 5 DAYS. LOWEST DETECTABLE LIMITS  FOR URINE DRUG SCREEN Drug Class                     Cutoff (ng/mL) Amphetamine and metabolites    1000 Barbiturate and metabolites    200 Benzodiazepine                 174 Tricyclics and metabolites     300 Opiates and metabolites        300 Cocaine and metabolites        300 THC                            50 Performed at [facility]  [address], [place], [place] [address]   Ethanol     Status: None   Collection Time: 09/14/17  5:35 PM  Result Value Ref Range   Alcohol, Ethyl (B) <10 <10 mg/dL    Comment:        LOWEST DETECTABLE LIMIT FOR SERUM ALCOHOL IS 10 mg/dL FOR MEDICAL PURPOSES ONLY Performed at [facility] [address] [place], [place] [address]   I-Stat beta hCG blood, ED     Status: None   Collection Time: 09/14/17  5:48 PM  Result Value Ref Range   I-stat hCG, quantitative <5.0 <5 mIU/mL   Comment 3            Comment:   GEST. AGE      CONC.  (mIU/mL)   <=1 WEEK        5 - 50     2 WEEKS       50 - 500     3 WEEKS       100 - 10,000     4 WEEKS     1,000 - 30,000        FEMALE AND NON-PREGNANT FEMALE:     LESS THAN 5 mIU/mL     Current Facility-Administered Medications  Medication Dose Route Frequency Provider Last Rate Last Dose  . acetaminophen (TYLENOL) tablet 500 mg  500 mg Oral Q6H PRN Avie Echevaria B, PA-C      . albuterol (PROVENTIL HFA;VENTOLIN HFA) 108 (90 Base) MCG/ACT inhaler 1-2 puff  1-2 puff Inhalation Q6H PRN Avie Echevaria B, PA-C      . escitalopram (LEXAPRO) tablet 10 mg  10 mg Oral Daily Avie Echevaria B, PA-C   10 mg at 09/15/17 0957  . hydrochlorothiazide (HYDRODIURIL) tablet 25 mg  25 mg Oral Daily Avie Echevaria B, PA-C   25 mg at 09/15/17 3846   Current Outpatient Medications  Medication Sig Dispense Refill  . acetaminophen (TYLENOL) 500 MG tablet Take 500 mg every 6 (six) hours as needed by mouth.    Marland Kitchen albuterol (PROVENTIL HFA;VENTOLIN HFA) 108 (90 Base) MCG/ACT inhaler Inhale 1-2 puffs into the lungs every 6 (six) hours as needed for wheezing or shortness of breath. 1 Inhaler 0  . Cyanocobalamin (B-12) 1000 MCG/ML KIT Inject 1,000 mcg as directed once a week.     . escitalopram (LEXAPRO) 10 MG tablet     . hydrochlorothiazide (HYDRODIURIL) 25 MG tablet     . Multiple Vitamins-Minerals (MULTIVITAMIN ADULT EXTRA C) CHEW Chew 2 tablets by  mouth daily.    . phentermine (ADIPEX-P) 37.5 MG tablet Take 1 tablet (37.5 mg total) by mouth daily before breakfast. 30 tablet 2  . potassium chloride (K-DUR,KLOR-CON) 10 MEQ tablet Take 1 tablet (10  mEq total) by mouth 2 (two) times daily. 180 tablet 1  . Cyanocobalamin 500 MCG/0.1ML SOLN Place 0.1 mLs (500 mcg total) into the nose once a week. (Patient not taking: Reported on 09/14/2017) 2.3 mL 11    Musculoskeletal: Strength & Muscle Tone: within normal limits Gait & Station: normal Patient leans: N/A  Psychiatric Specialty Exam: Physical Exam  Nursing note and vitals reviewed. Constitutional: She is oriented to person, place, and time. She appears well-developed and well-nourished.  HENT:  Head: Normocephalic.  Neck: Normal range of motion.  Respiratory: Effort normal.  Musculoskeletal: Normal range of motion.  Neurological: She is alert and oriented to person, place, and time.  Skin: No rash noted.  Psychiatric: Her speech is normal and behavior is normal. Thought content normal. Her mood appears anxious. Cognition and memory are normal. She expresses impulsivity.    Review of Systems  Psychiatric/Behavioral: Positive for depression. Negative for hallucinations, memory loss, substance abuse and suicidal ideas. The patient is nervous/anxious. The patient does not have insomnia.   All other systems reviewed and are negative.   Blood pressure 140/79, pulse 77, temperature 97.9 F (36.6 C), resp. rate 20, SpO2 100 %.There is no height or weight on file to calculate BMI.  General Appearance: Casual  Eye Contact:  Good  Speech:  Clear and Coherent and Normal Rate  Volume:  Normal  Mood:  Anxious  Affect:  Congruent  Thought Process:  Coherent, Goal Directed and Linear  Orientation:  Full (Time, Place, and Person)  Thought Content:  Logical  Suicidal Thoughts:  No  Homicidal Thoughts:  No  Memory:  Immediate;   Good Recent;   Good Remote;   Fair  Judgement:  Fair  Insight:   Fair  Psychomotor Activity:  Normal  Concentration:  Concentration: Good and Attention Span: Good  Recall:  Good  Fund of Knowledge:  Good  Language:  Good  Akathisia:  No  Handed:  Right  AIMS (if indicated):   N/A  Assets:  Mining engineer  ADL's:  Intact  Cognition:  WNL  Sleep:   N/A     Treatment Plan Summary: Plan Adjustment disorder with mixed disturbance of emotions and conduct  Discharge Home Follow up with [employer] resources through your employer Take all medications as prescribed  Disposition: No evidence of imminent risk to self or others at present.   Patient does not meet criteria for psychiatric inpatient admission. Supportive therapy provided about ongoing stressors. Discussed crisis plan, support from social network, calling 911, coming to the Emergency Department, and calling Suicide Hotline.  Ethelene Hal, NP 09/15/2017 10:40 AM   Patient seen face-to-face for psychiatric evaluation, chart reviewed and case discussed with the physician extender and developed treatment plan. Reviewed the information documented and agree with the treatment plan.  Buford Dresser, DO 09/15/17 6:50 PM

## 2017-09-15 NOTE — Discharge Instructions (Signed)
Follow up with:  E[employer] resources through your employer

## 2017-09-15 NOTE — [facility] Suicide Risk Assessment (Signed)
Suicide Risk Assessment  Discharge Assessment   [facility] Discharge Suicide Risk Assessment   Principal Problem: Adjustment disorder with mixed disturbance of emotions and conduct Discharge Diagnoses:  Patient Active Problem List   Diagnosis Date Noted  . Suicidal ideation [R45.851] 09/15/2017  . Adjustment disorder with mixed disturbance of emotions and conduct [F43.25] 09/15/2017  . Arthritis of carpometacarpal [facility]) joint of right thumb [M18.11] 09/22/2016  . ASCUS pap with negative high risk HPV on 06/30/16 [R87.610] 06/30/2016  . Arthritis of carpometacarpal [facility]) joint of left thumb [M18.12] 05/06/2016  . Anxiety and depression [F41.9, F32.9] 12/28/2015  . De Quervain's tenosynovitis, left [M65.4] 06/21/2015  . Prediabetes [R73.03] 11/02/2014  . Anemia, iron deficiency [D50.9] 09/17/2013  . Routine general medical examination at a health care facility [Z00.00] 01/12/2013  . Allergic rhinitis [J30.9] 01/12/2013  . Constipation, chronic [K59.09] 10/04/2012  . Visit for screening mammogram [Z12.31] 01/12/2012  . B12 deficiency anemia [D51.9] 01/09/2012  . Obesity (BMI 30-39.9) [E66.9] 01/09/2012  . Essential hypertension, benign [I10] 11/24/2011    Total Time spent with patient: 45 minutes  Musculoskeletal: Strength & Muscle Tone: within normal limits Gait & Station: normal Patient leans: N/A  Psychiatric Specialty Exam: Physical Exam  Constitutional: She is oriented to person, place, and time. She appears well-developed and well-nourished.  HENT:  Head: Normocephalic.  Respiratory: Effort normal.  Musculoskeletal: Normal range of motion.  Neurological: She is alert and oriented to person, place, and time.  Psychiatric: Her speech is normal and behavior is normal. Thought content normal. Her mood appears anxious. Cognition and memory are normal. She expresses impulsivity. She exhibits a depressed mood.   Review of Systems  Psychiatric/Behavioral: Positive for depression.  Negative for hallucinations, memory loss, substance abuse and suicidal ideas. The patient is nervous/anxious. The patient does not have insomnia.   All other systems reviewed and are negative.  Blood pressure 140/79, pulse 77, temperature 97.9 F (36.6 C), resp. rate 20, SpO2 100 %.There is no height or weight on file to calculate BMI. General Appearance: Casual Eye Contact:  Good Speech:  Clear and Coherent and Normal Rate Volume:  Normal Mood:  Anxious Affect:  Congruent Thought Process:  Coherent, Goal Directed and Linear Orientation:  Full (Time, Place, and Person) Thought Content:  Logical Suicidal Thoughts:  No Homicidal Thoughts:  No Memory:  Immediate;   Good Recent;   Good Remote;   Fair Judgement:  Fair Insight:  Fair Psychomotor Activity:  Normal Concentration:  Concentration: Good and Attention Span: Good Recall:  Good Fund of Knowledge:  Good Language:  Good Akathisia:  No Handed:  Right AIMS (if indicated):    Assets:  Mining engineer ADL's:  Intact Cognition:  WNL   Mental Status Per Nursing Assessment::   On Admission:   Depressed and anxious due to multiple stressors at home causing her to feel suicidal  Demographic Factors:  NA  Loss Factors: NA  Historical Factors: Impulsivity  Risk Reduction Factors:   Responsible for children under 44 years of age, Sense of responsibility to family, Employed and Living with another person, especially a relative  Continued Clinical Symptoms:  Depression:   Impulsivity  Cognitive Features That Contribute To Risk:  Closed-mindedness    Suicide Risk:  Minimal: No identifiable suicidal ideation.  Patients presenting with no risk factors but with morbid ruminations; may be classified as minimal risk based on the severity of the depressive symptoms    Plan Of Care/Follow-up recommendations:  Activity:  as tolerated Diet:   Heart Healthy  Ethelene Hal, NP 09/15/2017, 10:43 AM

## 2017-09-15 NOTE — ED Notes (Signed)
Pt discharged safely with husband after reviewing discharge instructions.  Pt was calm and cooperative.  Denied S/I and H/I.

## 2017-09-23 ENCOUNTER — Encounter: Payer: Self-pay | Admitting: Podiatry

## 2017-09-23 ENCOUNTER — Encounter: Payer: Self-pay | Admitting: Sports Medicine

## 2017-09-30 ENCOUNTER — Ambulatory Visit: Payer: Federal, State, Local not specified - PPO | Admitting: Internal Medicine

## 2017-10-06 ENCOUNTER — Encounter: Payer: Self-pay | Admitting: Internal Medicine

## 2017-10-06 ENCOUNTER — Ambulatory Visit: Payer: Federal, State, Local not specified - PPO | Admitting: Internal Medicine

## 2017-10-06 VITALS — BP 134/80 | HR 83 | Temp 98.7°F | Resp 16 | Ht 69.0 in | Wt 252.2 lb

## 2017-10-06 DIAGNOSIS — D510 Vitamin B12 deficiency anemia due to intrinsic factor deficiency: Secondary | ICD-10-CM

## 2017-10-06 DIAGNOSIS — I10 Essential (primary) hypertension: Secondary | ICD-10-CM

## 2017-10-06 MED ORDER — CYANOCOBALAMIN 1000 MCG/ML IJ SOLN
1000.0000 ug | Freq: Once | INTRAMUSCULAR | Status: AC
  Administered 2017-10-06: 1000 ug via INTRAMUSCULAR

## 2017-10-06 NOTE — Patient Instructions (Signed)

## 2017-10-06 NOTE — Progress Notes (Signed)
Subjective:  Patient ID: Mary Sanford, female    DOB: 15-Aug-1965  Age: 52 y.o. MRN: [id number]  CC: Hypertension   HPI BETTIE CAPISTRAN presents for a BP check - she was recently seen in the ED for a suicidal gesture.  She tells me she was not serious about any suicidal ideations.  She feels well today and offers no complaints.  She is not struggling with depression or anxiety.  She tells me her blood pressure has been well controlled.  Outpatient Medications Prior to Visit  Medication Sig Dispense Refill  . acetaminophen (TYLENOL) 500 MG tablet Take 500 mg every 6 (six) hours as needed by mouth.    Marland Kitchen albuterol (PROVENTIL HFA;VENTOLIN HFA) 108 (90 Base) MCG/ACT inhaler Inhale 1-2 puffs into the lungs every 6 (six) hours as needed for wheezing or shortness of breath. 1 Inhaler 0  . Cyanocobalamin (B-12) 1000 MCG/ML KIT Inject 1,000 mcg as directed once a week.     . escitalopram (LEXAPRO) 10 MG tablet     . hydrochlorothiazide (HYDRODIURIL) 25 MG tablet     . Multiple Vitamins-Minerals (MULTIVITAMIN ADULT EXTRA C) CHEW Chew 2 tablets by mouth daily.    . phentermine (ADIPEX-P) 37.5 MG tablet Take 1 tablet (37.5 mg total) by mouth daily before breakfast. 30 tablet 2  . potassium chloride (K-DUR,KLOR-CON) 10 MEQ tablet Take 1 tablet (10 mEq total) by mouth 2 (two) times daily. 180 tablet 1  . Cyanocobalamin 500 MCG/0.1ML SOLN Place 0.1 mLs (500 mcg total) into the nose once a week. 2.3 mL 11   No facility-administered medications prior to visit.     ROS Review of Systems  Constitutional: Negative.  Negative for appetite change, diaphoresis, fatigue and unexpected weight change.  HENT: Negative.   Eyes: Negative for visual disturbance.  Respiratory: Negative for cough, chest tightness, shortness of breath and wheezing.   Cardiovascular: Negative for chest pain, palpitations and leg swelling.  Gastrointestinal: Negative for abdominal pain, diarrhea, nausea and vomiting.  Genitourinary:  Negative.  Negative for difficulty urinating.  Musculoskeletal: Negative for back pain, myalgias and neck pain.  Skin: Negative.  Negative for pallor.  Neurological: Negative.  Negative for dizziness, weakness and light-headedness.  Hematological: Negative for adenopathy. Does not bruise/bleed easily.  Psychiatric/Behavioral: Negative.  Negative for dysphoric mood and sleep disturbance. The patient is not nervous/anxious.     Objective:  BP 134/80 (BP Location: Left Arm, Patient Position: Sitting, Cuff Size: Large)   Pulse 83   Temp 98.7 F (37.1 C) (Oral)   Resp 16   Ht 5' 9" (1.753 m)   Wt 252 lb 4 oz (114.4 kg)   SpO2 98%   BMI 37.25 kg/m   BP Readings from Last 3 Encounters:  10/07/17 124/88  10/06/17 134/80  09/15/17 140/79    Wt Readings from Last 3 Encounters:  10/07/17 252 lb 12.8 oz (114.7 kg)  10/06/17 252 lb 4 oz (114.4 kg)  08/26/17 249 lb (112.9 kg)    Physical Exam  Constitutional: She is oriented to person, place, and time. No distress.  HENT:  Mouth/Throat: Oropharynx is clear and moist. No oropharyngeal exudate.  Eyes: Conjunctivae are normal. Left eye exhibits no discharge. No scleral icterus.  Neck: Normal range of motion. Neck supple. No JVD present. No thyromegaly present.  Cardiovascular: Normal rate, regular rhythm and normal heart sounds. Exam reveals no gallop.  No murmur heard. Pulmonary/Chest: Effort normal and breath sounds normal. No respiratory distress. She has no wheezes. She  has no rales.  Abdominal: Soft. Bowel sounds are normal. She exhibits no distension and no mass. There is no tenderness. There is no guarding.  Musculoskeletal: Normal range of motion. She exhibits no edema, tenderness or deformity.  Lymphadenopathy:    She has no cervical adenopathy.  Neurological: She is alert and oriented to person, place, and time.  Skin: Skin is warm and dry. No rash noted. She is not diaphoretic. No erythema. No pallor.  Psychiatric: She has a  normal mood and affect. Her behavior is normal. Judgment and thought content normal.  Vitals reviewed.   Lab Results  Component Value Date   WBC 7.6 09/14/2017   HGB 13.8 09/14/2017   HCT 42.0 09/14/2017   PLT 287 09/14/2017   GLUCOSE 123 (H) 09/14/2017   CHOL 135 04/01/2017   TRIG 93.0 04/01/2017   HDL 38.30 (L) 04/01/2017   LDLCALC 78 04/01/2017   ALT 34 09/14/2017   AST 27 09/14/2017   NA 139 09/14/2017   K 3.4 (L) 09/14/2017   CL 102 09/14/2017   CREATININE 0.86 09/14/2017   BUN 11 09/14/2017   CO2 24 09/14/2017   TSH 1.21 04/01/2017   HGBA1C 6.3 04/01/2017    No results found.  Assessment & Plan:   Guila was seen today for hypertension.  Diagnoses and all orders for this visit:  Vitamin B12 deficiency anemia due to intrinsic factor deficiency -     cyanocobalamin ((VITAMIN B-12)) injection 1,000 mcg  Essential hypertension, benign- Her blood pressure is adequately well controlled.  Her recent potassium level was slightly low.  I have asked her to be more compliant with potassium replacement therapy.   I am having [name] maintain her albuterol, escitalopram, hydrochlorothiazide, acetaminophen, potassium chloride, phentermine, B-12, and MULTIVITAMIN ADULT EXTRA C. We administered cyanocobalamin.  Meds ordered this encounter  Medications  . cyanocobalamin ((VITAMIN B-12)) injection 1,000 mcg     Follow-up: Return in about 6 months (around 04/08/2018).  Scarlette Calico, MD

## 2017-10-07 ENCOUNTER — Ambulatory Visit: Payer: Federal, State, Local not specified - PPO | Admitting: Sports Medicine

## 2017-10-07 ENCOUNTER — Encounter: Payer: Self-pay | Admitting: Sports Medicine

## 2017-10-07 VITALS — BP 124/88 | HR 83 | Ht 69.0 in | Wt 252.8 lb

## 2017-10-07 DIAGNOSIS — M25532 Pain in left wrist: Secondary | ICD-10-CM

## 2017-10-07 DIAGNOSIS — G8929 Other chronic pain: Secondary | ICD-10-CM | POA: Diagnosis not present

## 2017-10-07 DIAGNOSIS — M79645 Pain in left finger(s): Secondary | ICD-10-CM

## 2017-10-07 DIAGNOSIS — M654 Radial styloid tenosynovitis [de Quervain]: Secondary | ICD-10-CM | POA: Diagnosis not present

## 2017-10-07 DIAGNOSIS — M1812 Unilateral primary osteoarthritis of first carpometacarpal joint, left hand: Secondary | ICD-10-CM | POA: Diagnosis not present

## 2017-10-07 NOTE — Patient Instructions (Signed)

## 2017-10-07 NOTE — Progress Notes (Signed)
Mary Sanford. Mary Sanford, [facility] [place]  Mary Sanford - 52 y.o. female MRN [id number]  Date of birth: 24-Mar-1966  Visit Date: 10/07/2017  PCP: [name], MD   Referred by: [name], MD   Scribe for today's visit: [name], LAT, ATC     SUBJECTIVE:  Mary Sanford is here for Follow-up (L hand pain / [name]'s ) .   01/23/17: Pt presents today in follow-up of right hand pain, arthritis CMC joint right thumb.  She received steroid injection 09/22/17. She was given a thumb spica splint to wear and encouraged to soak the hand if pain flared up. She was also prescribed Pennsaid.   Pt reports that the pain in her right hand has improved but she is now having pain in the left hand.  She is currently waring a wrist brace. She has constant pain in the left hand. She has marked the areas on her hand with pen for Dr. Paulla Fore to see. She has pain when typing, bending the wrist, picking things up. The pain is worse when there isn't compression on the wrist. She has tried icing the wrist and taking Ibuprofen with no relief. She denies radiating pain into the fingers or arm.  Pt denies fever, chills. She has night sweats but attributes that to menopause.   07/10/17: Compared to the last office visit, her previously described symptoms are worsening Current symptoms are moderate & are radiating to the wrist. She will have sudden sharp pains that seems to be more severe and are worse with certain movements. She has noticed some swelling around the joint. She does note a constant numbness at the joint. She feels like use of the hand is more limited. She has decreased grip strength.  She has been taking Ibuprofen prn, icing the area, and using compression with mild relief. She had steroid injection LT 1st [place] on 01/23/2017, she did get short term relief from this injection. She has a brace that she has been wearing that also provides  some support and relief.   08/26/17: Compared to the last office visit, her previously described symptoms of LT thumb pain are: are improving, shooting pain with certain movements and pain when picking up heavy object. She continues to have some swelling in the joint.  Current symptoms are moderate & are nonradiating She had steroid injection 01/23/17 and again 07/10/17. She has been taking Acetaminophen prn, icing the area, using compression, and wearing wrist brace with minimal relief.   10/07/17: Compared to the last office visit on 08/26/17, her previously described L thumb pain symptoms show no change.  She states that she con't to have random sharp pains in her L thumb and has difficulty w/ certain movement and trying to pick things up w/ her L hand. Current symptoms are severe & are nonradiating She has been using IBU and Tylenol in an alternating pattern.  She con't to wear a thumb spica splint.  She had a steroid injection at her last visit on 08/26/17.   ROS Denies night time disturbances. Denies fevers, chills, or night sweats. Denies unexplained weight loss. Denies personal history of cancer. Denies changes in bowel or bladder habits. Denies recent unreported falls. Denies new or worsening dyspnea or wheezing. Denies headaches or dizziness.  Reports numbness, tingling or weakness  In the extremities.  Numbness in the L thumb. Denies dizziness or presyncopal episodes Denies lower extremity edema  HISTORY & PERTINENT PRIOR DATA:  Prior History reviewed and updated per electronic medical record.  Significant history, findings, studies and interim changes include:  reports that she quit smoking about 18 years ago. Her smoking use included cigarettes. she has never used smokeless tobacco. Recent Labs    04/01/17 1005  HGBA1C 6.3   No specialty comments available. Problem  De Quervain's Tenosynovitis, Left   Injected 06/21/2015 - [facility] Injected 07/10/17 Paulla Fore      OBJECTIVE:  VS:  HT:5\' 9"  (175.3 cm)   WT:252 lb 12.8 oz (114.7 kg)  BMI:37.31    BP:124/88  HR:83bpm  TEMP: ( )  RESP:97 %   PHYSICAL EXAM: Constitutional: WDWN, Non-toxic appearing. Psychiatric: Alert & appropriately interactive.  Not depressed or anxious appearing. Respiratory: No increased work of breathing.  Trachea Midline Eyes: Pupils are equal.  EOM intact without nystagmus.  No scleral icterus  NEUROVASCULAR exam: No clubbing or cyanosis appreciated No significant venous stasis changes Capillary Refill: normal, less than 2 seconds   Bilateral hands are overall normal-appearing.  She does have a small amount of bossing of the left CMC joint but this is nonpainful.  Pain over the first dorsal compartment with palpation within the forearm and a small amount of pain directly over the radial styloid.  Significant pain with Wynn Maudlin testing but her flexion and extension of her thumb is improved compared to the prior office visits.  Grip strength is slightly diminished on the left compared to the right she is right-hand dominant.  Radial pulses 2+/4.   ASSESSMENT & PLAN:   1. Chronic pain of left thumb   2. De Quervain's tenosynovitis, left   3. Arthritis of carpometacarpal (CMC) joint of left thumb   4. Left wrist pain    ++++++++++++++++++++++++++++++++++++++++++++ Orders & Meds:  Orders Placed This Encounter  Procedures  . MR WRIST LEFT WO CONTRAST   No orders of the defined types were placed in this encounter.   ++++++++++++++++++++++++++++++++++++++++++++ PLAN:    De Quervain's tenosynovitis, left Patient is undergone multiple injections of her left wrist including CMC joint injections and first dorsal compartment injections with only temporary relief of her symptoms.  She does seem to be slightly improved but still has significant pain with Wynn Maudlin testing today.  This time given the lack of improvement with intervention further diagnostic evaluation with  MRI for consideration of surgical intervention.  We will plan to follow-up with her after this is obtained to discuss the results.   Follow-up: Return for MRI results review.   Pertinent documentation may be included in additional procedure notes, imaging studies, problem based documentation and patient instructions. Please see these sections of the encounter for additional information regarding this visit. CMA/ATC served as Education administrator during this visit. History, Physical, and Plan performed by medical provider. Documentation and orders reviewed and attested to.      Gerda Diss, [facility] Physician

## 2017-10-07 NOTE — Assessment & Plan Note (Signed)
Patient is undergone multiple injections of her left wrist including CMC joint injections and first dorsal compartment injections with only temporary relief of her symptoms.  She does seem to be slightly improved but still has significant pain with Wynn Maudlin testing today.  This time given the lack of improvement with intervention further diagnostic evaluation with MRI for consideration of surgical intervention.  We will plan to follow-up with her after this is obtained to discuss the results.

## 2017-10-14 ENCOUNTER — Other Ambulatory Visit: Payer: Self-pay | Admitting: Internal Medicine

## 2017-10-14 ENCOUNTER — Ambulatory Visit
Admission: RE | Admit: 2017-10-14 | Discharge: 2017-10-14 | Disposition: A | Discharge status: Home / Self Care | Payer: Federal, State, Local not specified - PPO | Source: Ambulatory Visit | Attending: Sports Medicine | Admitting: Sports Medicine

## 2017-10-14 DIAGNOSIS — M654 Radial styloid tenosynovitis [de Quervain]: Secondary | ICD-10-CM | POA: Diagnosis not present

## 2017-10-14 DIAGNOSIS — M79645 Pain in left finger(s): Principal | ICD-10-CM

## 2017-10-14 DIAGNOSIS — G8929 Other chronic pain: Secondary | ICD-10-CM

## 2017-10-27 ENCOUNTER — Encounter: Payer: Self-pay | Admitting: Internal Medicine

## 2017-10-29 ENCOUNTER — Other Ambulatory Visit: Payer: Self-pay | Admitting: Podiatry

## 2017-10-29 ENCOUNTER — Encounter: Payer: Self-pay | Admitting: Podiatry

## 2017-10-29 ENCOUNTER — Ambulatory Visit (INDEPENDENT_AMBULATORY_CARE_PROVIDER_SITE_OTHER): Payer: Federal, State, Local not specified - PPO

## 2017-10-29 ENCOUNTER — Ambulatory Visit: Payer: Federal, State, Local not specified - PPO | Admitting: Podiatry

## 2017-10-29 DIAGNOSIS — M79671 Pain in right foot: Secondary | ICD-10-CM

## 2017-10-29 DIAGNOSIS — M722 Plantar fascial fibromatosis: Secondary | ICD-10-CM

## 2017-10-29 DIAGNOSIS — M21619 Bunion of unspecified foot: Secondary | ICD-10-CM | POA: Diagnosis not present

## 2017-10-29 MED ORDER — TRIAMCINOLONE ACETONIDE 10 MG/ML IJ SUSP
10.0000 mg | Freq: Once | INTRAMUSCULAR | Status: AC
  Administered 2017-10-29: 10 mg

## 2017-10-29 MED ORDER — DICLOFENAC SODIUM 75 MG PO TBEC: 75.0000 mg | DELAYED_RELEASE_TABLET | Freq: Two times a day (BID) | ORAL | 2 refills | Status: DC

## 2017-10-29 NOTE — Progress Notes (Signed)
Subjective:   Patient ID: Mary Sanford, female   DOB: 52 y.o.   MRN: [id number]   HPI Patient presents with exquisite discomfort plantar aspect right heel of 3 months duration and also has long-term issues with bunion deformity bilaterally become painful and make it hard to wear shoe gear comfortably.  States she is tried wider shoes and tried other modalities without relief of symptoms and patient does not smoke currently and likes to be active   Review of Systems  All other systems reviewed and are negative.       Objective:  Physical Exam  Constitutional: She appears well-developed and well-nourished.  Cardiovascular: Intact distal pulses.  Pulmonary/Chest: Effort normal.  Musculoskeletal: Normal range of motion.  Neurological: She is alert.  Skin: Skin is warm.  Nursing note and vitals reviewed.   Neurovascular status intact muscle strength adequate range of motion within normal limits with patient found to have exquisite discomfort plantar aspect right heel at the insertional point of the tendon into the calcaneus with hyperostosis around the medial aspect first metatarsal head right and left with redness around the joint surface and pain with palpation.  Patient is noted to have good digital perfusion is well oriented x3     Assessment:  Acute plantar fasciitis left with structural bunion deformity symptomatic bilateral first metatarsal     Plan:  H&P x-rays reviewed both conditions discussed and at this point him to focus on the plantar fasciitis.  I did inject the plantar fascia 3 mg Kenalog 5 million Xylocaine applied fascial brace placed on diclofenac 75 mg twice daily and gave instructions for physical therapy.  Discussed bunions which she is motivated to have fax but will discuss it at next visit  X-ray indicates that there is plantar spur formation with structural bunion deformity noted of the right foot

## 2017-10-29 NOTE — Patient Instructions (Signed)
Plantar Fasciitis (Heel Spur Syndrome) with Rehab The plantar fascia is a fibrous, ligament-like, soft-tissue structure that spans the bottom of the foot. Plantar fasciitis is a condition that causes pain in the foot due to inflammation of the tissue. SYMPTOMS   Pain and tenderness on the underneath side of the foot.  Pain that worsens with standing or walking. CAUSES  Plantar fasciitis is caused by irritation and injury to the plantar fascia on the underneath side of the foot. Common mechanisms of injury include:  Direct trauma to bottom of the foot.  Damage to a small nerve that runs under the foot where the main fascia attaches to the heel bone.  Stress placed on the plantar fascia due to bone spurs. RISK INCREASES WITH:   Activities that place stress on the plantar fascia (running, jumping, pivoting, or cutting).  Poor strength and flexibility.  Improperly fitted shoes.  Tight calf muscles.  Flat feet.  Failure to warm-up properly before activity.  Obesity. PREVENTION  Warm up and stretch properly before activity.  Allow for adequate recovery between workouts.  Maintain physical fitness:  Strength, flexibility, and endurance.  Cardiovascular fitness.  Maintain a health body weight.  Avoid stress on the plantar fascia.  Wear properly fitted shoes, including arch supports for individuals who have flat feet.  PROGNOSIS  If treated properly, then the symptoms of plantar fasciitis usually resolve without surgery. However, occasionally surgery is necessary.  RELATED COMPLICATIONS   Recurrent symptoms that may result in a chronic condition.  Problems of the lower back that are caused by compensating for the injury, such as limping.  Pain or weakness of the foot during push-off following surgery.  Chronic inflammation, scarring, and partial or complete fascia tear, occurring more often from repeated injections.  TREATMENT  Treatment initially involves the  use of ice and medication to help reduce pain and inflammation. The use of strengthening and stretching exercises may help reduce pain with activity, especially stretches of the Achilles tendon. These exercises may be performed at home or with a therapist. Your caregiver may recommend that you use heel cups of arch supports to help reduce stress on the plantar fascia. Occasionally, corticosteroid injections are given to reduce inflammation. If symptoms persist for greater than 6 months despite non-surgical (conservative), then surgery may be recommended.   MEDICATION   If pain medication is necessary, then nonsteroidal anti-inflammatory medications, such as aspirin and ibuprofen, or other minor pain relievers, such as acetaminophen, are often recommended.  Do not take pain medication within 7 days before surgery.  Prescription pain relievers may be given if deemed necessary by your caregiver. Use only as directed and only as much as you need.  Corticosteroid injections may be given by your caregiver. These injections should be reserved for the most serious cases, because they may only be given a certain number of times.  HEAT AND COLD  Cold treatment (icing) relieves pain and reduces inflammation. Cold treatment should be applied for 10 to 15 minutes every 2 to 3 hours for inflammation and pain and immediately after any activity that aggravates your symptoms. Use ice packs or massage the area with a piece of ice (ice massage).  Heat treatment may be used prior to performing the stretching and strengthening activities prescribed by your caregiver, physical therapist, or athletic trainer. Use a heat pack or soak the injury in warm water.  SEEK IMMEDIATE MEDICAL CARE IF:  Treatment seems to offer no benefit, or the condition worsens.  Any medications   produce adverse side effects.  EXERCISES- RANGE OF MOTION (ROM) AND STRETCHING EXERCISES - Plantar Fasciitis (Heel Spur Syndrome) These exercises  may help you when beginning to rehabilitate your injury. Your symptoms may resolve with or without further involvement from your physician, physical therapist or athletic trainer. While completing these exercises, remember:   Restoring tissue flexibility helps normal motion to return to the joints. This allows healthier, less painful movement and activity.  An effective stretch should be held for at least 30 seconds.  A stretch should never be painful. You should only feel a gentle lengthening or release in the stretched tissue.  RANGE OF MOTION - Toe Extension, Flexion  Sit with your right / left leg crossed over your opposite knee.  Grasp your toes and gently pull them back toward the top of your foot. You should feel a stretch on the bottom of your toes and/or foot.  Hold this stretch for 10 seconds.  Now, gently pull your toes toward the bottom of your foot. You should feel a stretch on the top of your toes and or foot.  Hold this stretch for 10 seconds. Repeat  times. Complete this stretch 3 times per day.   RANGE OF MOTION - Ankle Dorsiflexion, Active Assisted  Remove shoes and sit on a chair that is preferably not on a carpeted surface.  Place right / left foot under knee. Extend your opposite leg for support.  Keeping your heel down, slide your right / left foot back toward the chair until you feel a stretch at your ankle or calf. If you do not feel a stretch, slide your bottom forward to the edge of the chair, while still keeping your heel down.  Hold this stretch for 10 seconds. Repeat 3 times. Complete this stretch 2 times per day.   STRETCH  Gastroc, Standing  Place hands on wall.  Extend right / left leg, keeping the front knee somewhat bent.  Slightly point your toes inward on your back foot.  Keeping your right / left heel on the floor and your knee straight, shift your weight toward the wall, not allowing your back to arch.  You should feel a gentle stretch  in the right / left calf. Hold this position for 10 seconds. Repeat 3 times. Complete this stretch 2 times per day.  STRETCH  Soleus, Standing  Place hands on wall.  Extend right / left leg, keeping the other knee somewhat bent.  Slightly point your toes inward on your back foot.  Keep your right / left heel on the floor, bend your back knee, and slightly shift your weight over the back leg so that you feel a gentle stretch deep in your back calf.  Hold this position for 10 seconds. Repeat 3 times. Complete this stretch 2 times per day.  STRETCH  Gastrocsoleus, Standing  Note: This exercise can place a lot of stress on your foot and ankle. Please complete this exercise only if specifically instructed by your caregiver.   Place the ball of your right / left foot on a step, keeping your other foot firmly on the same step.  Hold on to the wall or a rail for balance.  Slowly lift your other foot, allowing your body weight to press your heel down over the edge of the step.  You should feel a stretch in your right / left calf.  Hold this position for 10 seconds.  Repeat this exercise with a slight bend in your right /   left knee. Repeat 3 times. Complete this stretch 2 times per day.   STRENGTHENING EXERCISES - Plantar Fasciitis (Heel Spur Syndrome)  These exercises may help you when beginning to rehabilitate your injury. They may resolve your symptoms with or without further involvement from your physician, physical therapist or athletic trainer. While completing these exercises, remember:   Muscles can gain both the endurance and the strength needed for everyday activities through controlled exercises.  Complete these exercises as instructed by your physician, physical therapist or athletic trainer. Progress the resistance and repetitions only as guided.  STRENGTH - Towel Curls  Sit in a chair positioned on a non-carpeted surface.  Place your foot on a towel, keeping your heel  on the floor.  Pull the towel toward your heel by only curling your toes. Keep your heel on the floor. Repeat 3 times. Complete this exercise 2 times per day.  STRENGTH - Ankle Inversion  Secure one end of a rubber exercise band/tubing to a fixed object (table, pole). Loop the other end around your foot just before your toes.  Place your fists between your knees. This will focus your strengthening at your ankle.  Slowly, pull your big toe up and in, making sure the band/tubing is positioned to resist the entire motion.  Hold this position for 10 seconds.  Have your muscles resist the band/tubing as it slowly pulls your foot back to the starting position. Repeat 3 times. Complete this exercises 2 times per day.  Document Released: 07/21/2005 Document Revised: 10/13/2011 Document Reviewed: 11/02/2008 [employer] Patient Information 2014 [employer] Bunion A bunion is a bump on the base of the big toe that forms when the bones of the big toe joint move out of position. Bunions may be small at first, but they often get larger over time. The can make walking painful. What are the causes? A bunion may be caused by:  Wearing narrow or pointed shoes that force the big toe to press against the other toes.  Abnormal foot development that causes the foot to roll inward (pronate).  Changes in the foot that are caused by certain diseases, such as rheumatoid arthritis and polio.  A foot injury.  What increases the risk? The following factors may make you more likely to develop this condition:  Wearing shoes that squeeze the toes together.  Having certain diseases, such as: ? Rheumatoid arthritis. ? Polio. ? Cerebral palsy.  Having family members who have bunions.  Being born with a foot deformity, such as flat feet or low arches.  Doing activities that put a lot of pressure on the feet, such as ballet dancing.  What are the signs or symptoms? The main symptom of a bunion is a  noticeable bump on the big toe. Other symptoms may include:  Pain.  Swelling around the big toe.  Redness and inflammation.  Thick or hardened skin on the big toe or between the toes.  Stiffness or loss of motion in the big toe.  Trouble with walking.  How is this diagnosed? A bunion may be diagnosed based on your symptoms, medical history, and activities. You may have tests, such as:  X-rays. These allow your health care provider to check the position of the bones in your foot and look for damage to your joint. They also help your health care provider to determine the severity of your bunion and the best way to treat it.  Joint aspiration. In this test, a sample of fluid is removed from   the toe joint. This test, which may be done if you are in a lot of pain, helps to rule out diseases that cause painful swelling of the joints, such as arthritis.  How is this treated? There is no cure for a bunion, but treatment can help to prevent a bunion from getting worse. Treatment depends on the severity of your symptoms. Your health care provider may recommend:  Wearing shoes that have a wide toe box.  Using bunion pads to cushion the affected area.  Taping your toes together to keep them in a normal position.  Placing a device inside your shoe (orthotics) to help reduce pressure on your toe joint.  Taking medicine to ease pain, inflammation, and swelling.  Applying heat or ice to the affected area.  Doing stretching exercises.  Surgery to remove scar tissue and move the toes back into their normal position. This treatment is rare.  Follow these instructions at home:  Support your toe joint with proper footwear, shoe padding, or taping as told by your health care provider.  Take over-the-counter and prescription medicines only as told by your health care provider.  If directed, apply ice to the injured area: ? Put ice in a plastic bag. ? Place a towel between your skin and the  bag. ? Leave the ice on for 20 minutes, 2-3 times per day.  If directed, apply heat to the affected area before you exercise. Use the heat source that your health care provider recommends, such as a moist heat pack or a heating pad. ? Place a towel between your skin and the heat source. ? Leave the heat on for 20-30 minutes. ? Remove the heat if your skin turns bright red. This is especially important if you are unable to feel pain, heat, or cold. You may have a greater risk of getting burned.  Do exercises as told by your health care provider.  Keep all follow-up visits as told by your health care provider. Contact a health care provider if:  Your symptoms get worse.  Your symptoms do not improve in 2 weeks. Get help right away if:  You have severe pain and trouble with walking. This information is not intended to replace advice given to you by your health care provider. Make sure you discuss any questions you have with your health care provider. Document Released: 07/21/2005 Document Revised: 12/27/2015 Document Reviewed: 02/18/2015 [employer] Interactive Patient Education  2018 [employer]  

## 2017-10-30 ENCOUNTER — Ambulatory Visit: Payer: Self-pay | Admitting: Sports Medicine

## 2017-11-03 ENCOUNTER — Ambulatory Visit (INDEPENDENT_AMBULATORY_CARE_PROVIDER_SITE_OTHER): Payer: Federal, State, Local not specified - PPO | Admitting: Sports Medicine

## 2017-11-03 ENCOUNTER — Ambulatory Visit: Payer: Federal, State, Local not specified - PPO | Admitting: Sports Medicine

## 2017-11-03 ENCOUNTER — Ambulatory Visit: Payer: Self-pay | Admitting: Internal Medicine

## 2017-11-03 ENCOUNTER — Encounter: Payer: Self-pay | Admitting: Sports Medicine

## 2017-11-03 ENCOUNTER — Ambulatory Visit: Payer: Self-pay

## 2017-11-03 VITALS — BP 138/82 | HR 80 | Ht 69.0 in | Wt 255.0 lb

## 2017-11-03 DIAGNOSIS — M654 Radial styloid tenosynovitis [de Quervain]: Secondary | ICD-10-CM

## 2017-11-03 DIAGNOSIS — M79645 Pain in left finger(s): Secondary | ICD-10-CM | POA: Diagnosis not present

## 2017-11-03 DIAGNOSIS — G8929 Other chronic pain: Secondary | ICD-10-CM

## 2017-11-03 DIAGNOSIS — M19032 Primary osteoarthritis, left wrist: Secondary | ICD-10-CM | POA: Diagnosis not present

## 2017-11-03 DIAGNOSIS — Z0289 Encounter for other administrative examinations: Secondary | ICD-10-CM

## 2017-11-03 DIAGNOSIS — M1812 Unilateral primary osteoarthritis of first carpometacarpal joint, left hand: Secondary | ICD-10-CM | POA: Diagnosis not present

## 2017-11-03 NOTE — Progress Notes (Signed)
Juanda Bond. Keyandre Pileggi, [facility] [place]  CHAKITA MCGRAW - 52 y.o. female MRN [id number]  Date of birth: 11-29-1965  Visit Date: 11/03/2017  PCP: [name], MD   Referred by: [name], MD  Scribe for today's visit: [name], LAT, ATC     SUBJECTIVE:  TYLISA ALCIVAR is here for Follow-up (L thumb pain - [name]'s) .   01/23/17: Pt presents today in follow-up of right hand pain, arthritis CMC joint right thumb.  She received steroid injection 09/22/17. She was given a thumb spica splint to wear and encouraged to soak the hand if pain flared up. She was also prescribed Pennsaid.   Pt reports that the pain in her right hand has improved but she is now having pain in the left hand.  She is currently waring a wrist brace. She has constant pain in the left hand. She has marked the areas on her hand with pen for Dr. Paulla Fore to see. She has pain when typing, bending the wrist, picking things up. The pain is worse when there isn't compression on the wrist. She has tried icing the wrist and taking Ibuprofen with no relief. She denies radiating pain into the fingers or arm.  Pt denies fever, chills. She has night sweats but attributes that to menopause.   07/10/17: Compared to the last office visit, her previously described symptoms are worsening Current symptoms are moderate & are radiating to the wrist. She will have sudden sharp pains that seems to be more severe and are worse with certain movements. She has noticed some swelling around the joint. She does note a constant numbness at the joint. She feels like use of the hand is more limited. She has decreased grip strength.  She has been taking Ibuprofen prn, icing the area, and using compression with mild relief. She had steroid injection LT 1st [place] on 01/23/2017, she did get short term relief from this injection. She has a brace that she has been wearing that also provides  some support and relief.   08/26/17: Compared to the last office visit, her previously described symptoms of LT thumb pain are: are improving, shooting pain with certain movements and pain when picking up heavy object. She continues to have some swelling in the joint.  Current symptoms are moderate & are nonradiating She had steroid injection 01/23/17 and again 07/10/17. She has been taking Acetaminophen prn, icing the area, using compression, and wearing wrist brace with minimal relief.   10/07/17: Compared to the last office visit on 08/26/17, her previously described L thumb pain symptoms show no change.  She states that she con't to have random sharp pains in her L thumb and has difficulty w/ certain movement and trying to pick things up w/ her L hand. Current symptoms are severe & are nonradiating She has been using IBU and Tylenol in an alternating pattern.  She con't to wear a thumb spica splint.  She had a steroid injection at her last visit on 08/26/17.  11/03/17: Compared to the last office visit on 10/07/17, her previously described L thumb pain symptoms are worsening w/ pain noted when attempting to pick up her new granddaughter. Current symptoms are severe & are radiating to L forearm up to the elbow. She has been using Tylenol and wearing a thumb spica splint.  She had an MRI on 10/14/17 and is here to review her results.  ROS Denies night  time disturbances. Denies fevers, chills, or night sweats. Denies unexplained weight loss. Denies personal history of cancer. Denies changes in bowel or bladder habits. Denies recent unreported falls. Denies new or worsening dyspnea or wheezing. Denies headaches or dizziness.  Reports numbness, tingling or weakness  In the extremities - in the L thumb Denies dizziness or presyncopal episodes Denies lower extremity edema    HISTORY & PERTINENT PRIOR DATA:  Prior History reviewed and updated per electronic medical record.    Significant/pertinent history, findings, studies include:  reports that she quit smoking about 18 years ago. Her smoking use included cigarettes. She has never used smokeless tobacco. Recent Labs    04/01/17 1005 11/11/17 1345  HGBA1C 6.3 6.4   No specialty comments available. No problems updated.  OBJECTIVE:  VS:  HT:5\' 9"  (175.3 cm)   WT:255 lb (115.7 kg)  BMI:37.64    BP:138/82  HR:80bpm  TEMP: ( )  RESP:96 %   PHYSICAL EXAM: Constitutional: WDWN, Non-toxic appearing. Psychiatric: Alert & appropriately interactive.  Not depressed or anxious appearing. Respiratory: No increased work of breathing.  Trachea Midline Eyes: Pupils are equal.  EOM intact without nystagmus.  No scleral icterus  Vascular Exam: warm to touch no edema  upper extremity neuro exam: unremarkable normal strength normal sensation normal reflexes  MSK Exam: Persistent pain over the left wrist as well as CMC joint.  Pain with twisting testing as well as with grip strength testing.  Ligamentously she is stable does have pain with resisted varus and valgus stress as well as anterior posterior stress to the [facility] joint.   ASSESSMENT & PLAN:   1. Primary osteoarthritis of left wrist   2. Chronic pain of left thumb   3. De Quervain's tenosynovitis, left   4. Arthritis of carpometacarpal (CMC) joint of left thumb     PLAN: Ultrasound-guided injection today per procedure note.  We did discuss using appropriate CMC bracing as well as thumb spica bracing and at this point understands that this is the last injection we will be performing given the possible need for surgical intervention.  We can consider further diagnostic evaluation by hand surgery next if any lack of improvement.  MRI from 10/14/2017 suggesting that there is more osteoarthritic changes within the first [facility] joints that are likely contributing..  Follow-up: Return in about 8 weeks (around 12/29/2017).      Please see additional  documentation for Objective, Assessment and Plan sections. Pertinent additional documentation may be included in corresponding procedure notes, imaging studies, problem based documentation and patient instructions. Please see these sections of the encounter for additional information regarding this visit.  CMA/ATC served as Education administrator during this visit. History, Physical, and Plan performed by medical provider. Documentation and orders reviewed and attested to.      Gerda Diss, [facility] Physician

## 2017-11-03 NOTE — Patient Instructions (Signed)

## 2017-11-05 ENCOUNTER — Encounter: Payer: Self-pay | Admitting: Sports Medicine

## 2017-11-11 ENCOUNTER — Ambulatory Visit: Payer: Federal, State, Local not specified - PPO | Admitting: Internal Medicine

## 2017-11-11 ENCOUNTER — Encounter: Payer: Self-pay | Admitting: Internal Medicine

## 2017-11-11 ENCOUNTER — Other Ambulatory Visit (INDEPENDENT_AMBULATORY_CARE_PROVIDER_SITE_OTHER): Payer: Federal, State, Local not specified - PPO

## 2017-11-11 VITALS — BP 130/88 | HR 81 | Temp 98.9°F | Resp 16 | Ht 69.0 in | Wt 258.5 lb

## 2017-11-11 DIAGNOSIS — E538 Deficiency of other specified B group vitamins: Secondary | ICD-10-CM

## 2017-11-11 DIAGNOSIS — I10 Essential (primary) hypertension: Secondary | ICD-10-CM | POA: Diagnosis not present

## 2017-11-11 DIAGNOSIS — E669 Obesity, unspecified: Secondary | ICD-10-CM

## 2017-11-11 DIAGNOSIS — R7303 Prediabetes: Secondary | ICD-10-CM | POA: Diagnosis not present

## 2017-11-11 LAB — BASIC METABOLIC PANEL
BUN: 10 mg/dL (ref 6–23)
CALCIUM: 9.8 mg/dL (ref 8.4–10.5)
CO2: 32 meq/L (ref 19–32)
Chloride: 102 mEq/L (ref 96–112)
Creatinine, Ser: 0.82 mg/dL (ref 0.40–1.20)
GFR: 94.71 mL/min (ref >60.00)
Glucose, Bld: 103 mg/dL — ABNORMAL HIGH (ref 70–99)
Potassium: 3.7 mEq/L (ref 3.5–5.1)
Sodium: 140 mEq/L (ref 135–145)

## 2017-11-11 LAB — HEMOGLOBIN A1C: HEMOGLOBIN A1C: 6.4 % (ref 4.6–6.5)

## 2017-11-11 MED ORDER — PHENTERMINE HCL 37.5 MG PO TABS
37.5000 mg | ORAL_TABLET | Freq: Every day | ORAL | 2 refills | Status: DC
Start: 2017-11-11 — End: 2018-02-10

## 2017-11-11 MED ORDER — CYANOCOBALAMIN 1000 MCG/ML IJ SOLN
1000.0000 ug | Freq: Once | INTRAMUSCULAR | Status: AC
  Administered 2017-11-11: 1000 ug via INTRAMUSCULAR

## 2017-11-11 MED ORDER — LIRAGLUTIDE -WEIGHT MANAGEMENT 18 MG/3ML ~~LOC~~ SOPN: 3.0000 mg | PEN_INJECTOR | Freq: Every day | SUBCUTANEOUS | 5 refills | Status: DC

## 2017-11-11 NOTE — Progress Notes (Signed)
Subjective:  Patient ID: Mary Sanford, female    DOB: 1966/06/01  Age: 52 y.o. MRN: [id number]  CC: Hypertension   HPI Mary Sanford presents for f/up - She complains that she continues to gain weight and has not been approved yet for bariatric surgery.  She wants to restart a course of phentermine and try something else as well.  She tells me her blood pressure has been well controlled.  Outpatient Medications Prior to Visit  Medication Sig Dispense Refill  . acetaminophen (TYLENOL) 500 MG tablet Take 500 mg every 6 (six) hours as needed by mouth.    Marland Kitchen albuterol (PROVENTIL HFA;VENTOLIN HFA) 108 (90 Base) MCG/ACT inhaler Inhale 1-2 puffs into the lungs every 6 (six) hours as needed for wheezing or shortness of breath. 1 Inhaler 0  . Cyanocobalamin (B-12) 1000 MCG/ML KIT Inject 1,000 mcg as directed once a week.     . diclofenac (VOLTAREN) 75 MG EC tablet Take 1 tablet (75 mg total) by mouth 2 (two) times daily. 50 tablet 2  . escitalopram (LEXAPRO) 10 MG tablet     . hydrochlorothiazide (HYDRODIURIL) 25 MG tablet TAKE 1 TABLET BY MOUTH ONCE DAILY 90 tablet 1  . Multiple Vitamins-Minerals (MULTIVITAMIN ADULT EXTRA C) CHEW Chew 2 tablets by mouth daily.    . potassium chloride (K-DUR,KLOR-CON) 10 MEQ tablet Take 1 tablet (10 mEq total) by mouth 2 (two) times daily. 180 tablet 1  . phentermine (ADIPEX-P) 37.5 MG tablet Take 1 tablet (37.5 mg total) by mouth daily before breakfast. 30 tablet 2   No facility-administered medications prior to visit.     ROS Review of Systems  Constitutional: Positive for unexpected weight change. Negative for appetite change, chills, diaphoresis and fatigue.  HENT: Negative.   Eyes: Negative.  Negative for visual disturbance.  Respiratory: Negative for cough, chest tightness, shortness of breath and wheezing.   Cardiovascular: Negative.  Negative for chest pain, palpitations and leg swelling.  Gastrointestinal: Negative for abdominal pain,  constipation, diarrhea, nausea and vomiting.  Genitourinary: Negative.  Negative for difficulty urinating.  Musculoskeletal: Negative.  Negative for back pain and myalgias.  Skin: Negative.  Negative for color change.  Neurological: Negative.  Negative for dizziness, weakness, light-headedness and headaches.  Hematological: Negative for adenopathy. Does not bruise/bleed easily.  Psychiatric/Behavioral: Negative.  Negative for decreased concentration, dysphoric mood and sleep disturbance. The patient is not nervous/anxious.     Objective:  BP 130/88 (BP Location: Left Arm, Patient Position: Sitting, Cuff Size: Large)   Pulse 81   Temp 98.9 F (37.2 C) (Oral)   Resp 16   Ht '5\' 9"'  (1.753 m)   Wt 258 lb 8 oz (117.3 kg)   SpO2 96%   BMI 38.17 kg/m   BP Readings from Last 3 Encounters:  11/11/17 130/88  11/03/17 138/82  10/07/17 124/88    Wt Readings from Last 3 Encounters:  11/11/17 258 lb 8 oz (117.3 kg)  11/03/17 255 lb (115.7 kg)  10/07/17 252 lb 12.8 oz (114.7 kg)    Physical Exam  Constitutional: She is oriented to person, place, and time. No distress.  HENT:  Mouth/Throat: Oropharynx is clear and moist. No oropharyngeal exudate.  Eyes: Conjunctivae are normal. No scleral icterus.  Neck: Normal range of motion. Neck supple. No JVD present. No thyromegaly present.  Cardiovascular: Normal rate, regular rhythm and normal heart sounds. Exam reveals no gallop and no friction rub.  No murmur heard. Pulmonary/Chest: Effort normal and breath sounds normal. No  stridor. No respiratory distress. She has no wheezes. She has no rales.  Abdominal: Soft. Bowel sounds are normal. She exhibits no distension and no mass. There is no tenderness. There is no guarding.  Musculoskeletal: Normal range of motion. She exhibits no edema, tenderness or deformity.  Lymphadenopathy:    She has no cervical adenopathy.  Neurological: She is oriented to person, place, and time.  Skin: Skin is warm and  dry. She is not diaphoretic.  Psychiatric: She has a normal mood and affect. Her behavior is normal. Judgment and thought content normal.  Vitals reviewed.   Lab Results  Component Value Date   WBC 7.6 09/14/2017   HGB 13.8 09/14/2017   HCT 42.0 09/14/2017   PLT 287 09/14/2017   GLUCOSE 103 (H) 11/11/2017   CHOL 135 04/01/2017   TRIG 93.0 04/01/2017   HDL 38.30 (L) 04/01/2017   LDLCALC 78 04/01/2017   ALT 34 09/14/2017   AST 27 09/14/2017   NA 140 11/11/2017   K 3.7 11/11/2017   CL 102 11/11/2017   CREATININE 0.82 11/11/2017   BUN 10 11/11/2017   CO2 32 11/11/2017   TSH 1.21 04/01/2017   HGBA1C 6.4 11/11/2017    Mr Wrist Left Wo Contrast  Result Date: 10/14/2017 CLINICAL DATA:  Left wrist pain along the lateral aspect of the snuffbox. Rule out de Quervain syndrome EXAM: MR OF THE LEFT WRIST WITHOUT CONTRAST TECHNIQUE: Multiplanar, multisequence MR imaging of the left wrist was performed. No intravenous contrast was administered. COMPARISON:  None. FINDINGS: Ligaments: Intact scapholunate and lunotriquetral ligaments. Triangular fibrocartilage: Intact TFCC. Tendons: Thickening of the abductor pollicis longus and extensor pollicis brevis outlined by trace fluid compatible with tenosynovitis and compartment one. There is mild associated intramuscular edema of the opponens pollicis and abductor pollicis brevis muscles as well as small amount of subcutaneous fluid overlying compartment one. The remainder of the extensor and flexor tendons are unremarkable. Carpal tunnel/median nerve: Normal carpal tunnel. Normal median nerve. Guyon's canal: Normal. Joint/cartilage: Mild chondral thinning of the radiocarpal cartilage and involving the base of the thumb metacarpal at the first [facility] joint. No focal chondral defects. Bones/carpal alignment: Subcortical degenerative cystic lucencies at the base of the thumb metacarpal, capitate and lunate. No acute fracture or suspicious osseous lesions. Other:  None IMPRESSION: 1. Mild tenosynovitis involving the abductor pollicis longus and extensor pollicis brevis tendons adjacent to the styloid of the radius with tendinosis. 2. Osteoarthritis of the radiocarpal and first CMC joints. 3. Mild intramuscular edema of the opponens pollicis and abductor pollicis brevis muscles consistent with chronic repetitive use and muscle strain. Electronically Signed   By: Ashley Royalty M.D.   On: 10/14/2017 14:34    Assessment & Plan:   Becca was seen today for hypertension.  Diagnoses and all orders for this visit:  Essential hypertension, benign-blood pressure is well controlled.  Electrolytes and renal function are normal. -     Basic metabolic panel; Future  Prediabetes- Her A1c is up to 6.4%.  She is on the verge of developing type 2 diabetes mellitus.  I have asked her to restart phentermine and to add on a GLP-1 agonist to help her lose weight and to prevent the development of type 2 diabetes mellitus. -     Basic metabolic panel; Future -     Hemoglobin A1c; Future  Obesity (BMI 30-39.9) -     Liraglutide -Weight Management (SAXENDA) 18 MG/3ML SOPN; Inject 3 mg into the skin daily. -  phentermine (ADIPEX-P) 37.5 MG tablet; Take 1 tablet (37.5 mg total) by mouth daily before breakfast.  B12 deficiency -     cyanocobalamin ((VITAMIN B-12)) injection 1,000 mcg   I am having Mary Sanford. Mary Sanford start on Liraglutide -Weight Management. I am also having her maintain her albuterol, escitalopram, acetaminophen, potassium chloride, B-12, MULTIVITAMIN ADULT EXTRA C, hydrochlorothiazide, diclofenac, and phentermine. We administered cyanocobalamin.  Meds ordered this encounter  Medications  . Liraglutide -Weight Management (SAXENDA) 18 MG/3ML SOPN    Sig: Inject 3 mg into the skin daily.    Dispense:  5 pen    Refill:  5  . phentermine (ADIPEX-P) 37.5 MG tablet    Sig: Take 1 tablet (37.5 mg total) by mouth daily before breakfast.    Dispense:  30 tablet     Refill:  2  . cyanocobalamin ((VITAMIN B-12)) injection 1,000 mcg     Follow-up: Return in about 3 months (around 02/10/2018).  Scarlette Calico, MD

## 2017-11-11 NOTE — Patient Instructions (Signed)

## 2017-11-12 ENCOUNTER — Ambulatory Visit (INDEPENDENT_AMBULATORY_CARE_PROVIDER_SITE_OTHER): Payer: Federal, State, Local not specified - PPO

## 2017-11-12 ENCOUNTER — Encounter: Payer: Self-pay | Admitting: Podiatry

## 2017-11-12 ENCOUNTER — Ambulatory Visit: Payer: Federal, State, Local not specified - PPO | Admitting: Podiatry

## 2017-11-12 DIAGNOSIS — M21619 Bunion of unspecified foot: Secondary | ICD-10-CM

## 2017-11-12 DIAGNOSIS — M722 Plantar fascial fibromatosis: Secondary | ICD-10-CM | POA: Diagnosis not present

## 2017-11-12 NOTE — Progress Notes (Signed)
Subjective:   Patient ID: [name], female   DOB: 52 y.o.   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  At this point I have recommended correction and I allow patient to read consent form going over alternative treatments complications and surgery itself.  Patient wants surgery for bunion correction will have the right one done first and at this time she signed consent form understanding total recovery can take 6 months to 1 year with no long-term guarantees.  I did dispense air fracture walker to use for the postoperative.

## 2017-11-12 NOTE — Patient Instructions (Addendum)
Pre-Operative Instructions  Congratulations, you have decided to take an important step towards improving your quality of life.  You can be assured that the doctors and staff at [facility] will be with you every step of the way.  Here are some important things you should know:  1. Plan to be at the surgery center/hospital at least 1 (one) hour prior to your scheduled time, unless otherwise directed by the surgical center/hospital staff.  You must have a responsible adult accompany you, remain during the surgery and drive you home.  Make sure you have directions to the surgical center/hospital to ensure you arrive on time. 2. If you are having surgery at [facility] or [facility] hospitals, you will need a copy of your medical history and physical form from your family physician within one month prior to the date of surgery. We will give you a form for your primary physician to complete.  3. We make every effort to accommodate the date you request for surgery.  However, there are times where surgery dates or times have to be moved.  We will contact you as soon as possible if a change in schedule is required.   4. No aspirin/ibuprofen for one week before surgery.  If you are on aspirin, any non-steroidal anti-inflammatory medications (Mobic, Aleve, Ibuprofen) should not be taken seven (7) days prior to your surgery.  You make take Tylenol for pain prior to surgery.  5. Medications - If you are taking daily heart and blood pressure medications, seizure, reflux, allergy, asthma, anxiety, pain or diabetes medications, make sure you notify the surgery center/hospital before the day of surgery so they can tell you which medications you should take or avoid the day of surgery. 6. No food or drink after midnight the night before surgery unless directed otherwise by surgical center/hospital staff. 7. No alcoholic beverages 24-hours prior to surgery.  No smoking 24-hours prior or 24-hours after  surgery. 8. Wear loose pants or shorts. They should be loose enough to fit over bandages, boots, and casts. 9. Don't wear slip-on shoes. Sneakers are preferred. 10. Bring your boot with you to the surgery center/hospital.  Also bring crutches or a walker if your physician has prescribed it for you.  If you do not have this equipment, it will be provided for you after surgery. 11. If you have not been contacted by the surgery center/hospital by the day before your surgery, call to confirm the date and time of your surgery. 12. Leave-time from work may vary depending on the type of surgery you have.  Appropriate arrangements should be made prior to surgery with your employer. 13. Prescriptions will be provided immediately following surgery by your doctor.  Fill these as soon as possible after surgery and take the medication as directed. Pain medications will not be refilled on weekends and must be approved by the doctor. 14. Remove nail polish on the operative foot and avoid getting pedicures prior to surgery. 15. Wash the night before surgery.  The night before surgery wash the foot and leg well with water and the antibacterial soap provided. Be sure to pay special attention to beneath the toenails and in between the toes.  Wash for at least three (3) minutes. Rinse thoroughly with water and dry well with a towel.  Perform this wash unless told not to do so by your physician.  Enclosed: 1 Ice pack (please put in freezer the night before surgery)   1 Hibiclens skin cleaner     Pre-op instructions  If you have any questions regarding the instructions, please do not hesitate to call our office.  [place]: [address], [place], [place] [address] -- [phone]  [place]: [address] [place], [place] [address] -- [phone]  [place]: [address]  [place], [place] [address] -- [phone]  [place]: [address], [address], [place], [place] [address] -- [phone]  Website:  [email or web address] 

## 2017-11-24 ENCOUNTER — Encounter: Payer: Self-pay | Admitting: Podiatry

## 2017-11-24 DIAGNOSIS — M2011 Hallux valgus (acquired), right foot: Secondary | ICD-10-CM | POA: Diagnosis not present

## 2017-11-24 DIAGNOSIS — M21611 Bunion of right foot: Secondary | ICD-10-CM | POA: Diagnosis not present

## 2017-11-24 DIAGNOSIS — M722 Plantar fascial fibromatosis: Secondary | ICD-10-CM | POA: Diagnosis not present

## 2017-11-24 DIAGNOSIS — I10 Essential (primary) hypertension: Secondary | ICD-10-CM | POA: Diagnosis not present

## 2017-11-25 ENCOUNTER — Telehealth: Payer: Self-pay | Admitting: Podiatry

## 2017-11-25 NOTE — Telephone Encounter (Signed)
I asked pt to describe the pain she was having and she said just an aching. I told pt that sometimes, just a change of sensation helped, so to sit down remove the boot, open-ended sock, ace wrap then elevate the foot for 15 minutes after 38minutes, place the foot level with the hip and loosely rewrap the ace beginning at the toes and reapply the sock and boot, and could ice the area for 15 minutes/hour putting the ice pack behind the knee, protecting the skin from the ice with a cloth. I told pt to continue the percocet alternating with the ibuprofen as OTC package instructs and use benadryl for the itching of the foot, which could be the sensation of the numbness leaving. I told pt to call with concerns. Pt states understanding.

## 2017-11-25 NOTE — Telephone Encounter (Signed)
This message is for the nurse. I had foot surgery yesterday and I have some questions in regards to my pain level. When can I expect for it to feel better? Also, it is itching like crazy. I never received a note to be out of work or for how long. My foot is just not letting up, the pain will not go away even when taking the percocet and the antibiotics. I've even added ibuprofen and I'm also taking that Zofran. I just have some questions. I think I scratched my foot too hard because I had to use something to go down in that boot. So please call me back [phone]. Thanks.

## 2017-12-02 ENCOUNTER — Encounter: Payer: Self-pay | Admitting: Podiatry

## 2017-12-02 ENCOUNTER — Ambulatory Visit (INDEPENDENT_AMBULATORY_CARE_PROVIDER_SITE_OTHER): Payer: Federal, State, Local not specified - PPO

## 2017-12-02 ENCOUNTER — Ambulatory Visit (INDEPENDENT_AMBULATORY_CARE_PROVIDER_SITE_OTHER): Payer: Federal, State, Local not specified - PPO | Admitting: Podiatry

## 2017-12-02 VITALS — BP 143/93 | HR 75 | Temp 96.6°F

## 2017-12-02 DIAGNOSIS — M21619 Bunion of unspecified foot: Secondary | ICD-10-CM

## 2017-12-02 DIAGNOSIS — R7303 Prediabetes: Secondary | ICD-10-CM | POA: Diagnosis not present

## 2017-12-02 DIAGNOSIS — M25531 Pain in right wrist: Secondary | ICD-10-CM | POA: Diagnosis not present

## 2017-12-02 DIAGNOSIS — E669 Obesity, unspecified: Secondary | ICD-10-CM | POA: Diagnosis not present

## 2017-12-02 DIAGNOSIS — M25532 Pain in left wrist: Secondary | ICD-10-CM | POA: Diagnosis not present

## 2017-12-03 NOTE — Progress Notes (Signed)
Subjective:   Patient ID: Mary Sanford, female   DOB: 52 y.o.   MRN: [id number]   HPI Patient presents stating she is doing really well with the right foot and she like to get the left foot fixed as soon as possible   ROS      Objective:  Physical Exam  Neurovascular status intact negative Homans sign was noted with wound edges well coapted first metatarsal right with good alignment of the first metatarsal excellent correction with good range of motion with structural bunion deformity noted left     Assessment:  Doing well post osteotomy first metatarsal right with structural bunion deformity left     Plan:  Reviewed condition and reapplied sterile dressing continue immobilization elevation compression for the right discussed correction of left which she will have done in 2 weeks and will reappoint 1 week for consult  X-ray indicates the osteotomy is healing well aligned with good joint congruous fixation in place

## 2017-12-09 ENCOUNTER — Encounter: Payer: Self-pay | Admitting: Podiatry

## 2017-12-09 ENCOUNTER — Ambulatory Visit (INDEPENDENT_AMBULATORY_CARE_PROVIDER_SITE_OTHER): Payer: Federal, State, Local not specified - PPO | Admitting: Podiatry

## 2017-12-09 ENCOUNTER — Ambulatory Visit (INDEPENDENT_AMBULATORY_CARE_PROVIDER_SITE_OTHER): Payer: Federal, State, Local not specified - PPO

## 2017-12-09 DIAGNOSIS — M2011 Hallux valgus (acquired), right foot: Secondary | ICD-10-CM

## 2017-12-09 NOTE — Patient Instructions (Signed)
Pre-Operative Instructions  Congratulations, you have decided to take an important step towards improving your quality of life.  You can be assured that the doctors and staff at [facility] will be with you every step of the way.  Here are some important things you should know:  1. Plan to be at the surgery center/hospital at least 1 (one) hour prior to your scheduled time, unless otherwise directed by the surgical center/hospital staff.  You must have a responsible adult accompany you, remain during the surgery and drive you home.  Make sure you have directions to the surgical center/hospital to ensure you arrive on time. 2. If you are having surgery at [facility] or [facility], you will need a copy of your medical history and physical form from your family physician within one month prior to the date of surgery. We will give you a form for your primary physician to complete.  3. We make every effort to accommodate the date you request for surgery.  However, there are times where surgery dates or times have to be moved.  We will contact you as soon as possible if a change in schedule is required.   4. No aspirin/ibuprofen for one week before surgery.  If you are on aspirin, any non-steroidal anti-inflammatory medications (Mobic, Aleve, Ibuprofen) should not be taken seven (7) days prior to your surgery.  You make take Tylenol for pain prior to surgery.  5. Medications - If you are taking daily heart and blood pressure medications, seizure, reflux, allergy, asthma, anxiety, pain or diabetes medications, make sure you notify the surgery center/hospital before the day of surgery so they can tell you which medications you should take or avoid the day of surgery. 6. No food or drink after midnight the night before surgery unless directed otherwise by surgical center/hospital staff. 7. No alcoholic beverages 24-hours prior to surgery.  No smoking 24-hours prior or 24-hours after  surgery. 8. Wear loose pants or shorts. They should be loose enough to fit over bandages, boots, and casts. 9. Don't wear slip-on shoes. Sneakers are preferred. 10. Bring your boot with you to the surgery center/hospital.  Also bring crutches or a walker if your physician has prescribed it for you.  If you do not have this equipment, it will be provided for you after surgery. 11. If you have not been contacted by the surgery center/hospital by the day before your surgery, call to confirm the date and time of your surgery. 12. Leave-time from work may vary depending on the type of surgery you have.  Appropriate arrangements should be made prior to surgery with your employer. 13. Prescriptions will be provided immediately following surgery by your doctor.  Fill these as soon as possible after surgery and take the medication as directed. Pain medications will not be refilled on weekends and must be approved by the doctor. 14. Remove nail polish on the operative foot and avoid getting pedicures prior to surgery. 15. Wash the night before surgery.  The night before surgery wash the foot and leg well with water and the antibacterial soap provided. Be sure to pay special attention to beneath the toenails and in between the toes.  Wash for at least three (3) minutes. Rinse thoroughly with water and dry well with a towel.  Perform this wash unless told not to do so by your physician.  Enclosed: 1 Ice pack (please put in freezer the night before surgery)   1 Hibiclens skin cleaner     Pre-op instructions  If you have any questions regarding the instructions, please do not hesitate to call our office.  [place]: [address], , [place] [address] -- [phone]  [place]: [address] [place], [place] [address] -- [phone]  [place]: [address]  [place], [place] [address] -- [phone]  [place]: [address], [address], [place], [place] [address] -- [phone]  Website:  [email or web address] 

## 2017-12-09 NOTE — Progress Notes (Signed)
Subjective:   Patient ID: Mary Sanford, female   DOB: 52 y.o.   MRN: [id number]   HPI Patient states right foot is doing real well but I wanted to get it checked because it feels like I might have done some of the bone as I walked on it and I am ready to get this left bunion fixed next week   ROS      Objective:  Physical Exam  Neurovascular status intact negative Homans sign noted with mild increase in edema around the right first MPJ with good alignment noted and the left bunion quite prominent with irritation noted of the bone structure     Assessment:  Doing well post osteotomy right with possibility for irritation from activity and structural bunion deformity left     Plan:  H&P x-rays reviewed right and for the left one I went ahead and reviewed her consent form going over the surgery and alternative treatments complications and the fact that just because one did so well there is no guarantee the second 1 will and patient understands this wants surgery and at this time signed consent form after review.  X-rays of right indicate the osteotomy is healing well and she has not done any further damage to it with her activity levels

## 2017-12-09 NOTE — Addendum Note (Signed)
Addended by: Celene Skeen A on: 12/09/2017 04:29 PM   Modules accepted: Orders

## 2017-12-10 ENCOUNTER — Encounter: Payer: Self-pay | Admitting: Sports Medicine

## 2017-12-10 NOTE — Procedures (Signed)
PROCEDURE NOTE:  Ultrasound Guided: Injection: Left cmc joint Images were obtained and interpreted by myself, Teresa Coombs, DO  Images have been saved and stored to PACS system. Images obtained on: GE S7 Ultrasound machine    ULTRASOUND FINDINGS:  Degenerative spurring with hypoechoic change and increased intra-articular fluid.  DESCRIPTION OF PROCEDURE:  The patient's clinical condition is marked by substantial pain and/or significant functional disability. Other conservative therapy has not provided relief, is contraindicated, or not appropriate. There is a reasonable likelihood that injection will significantly improve the patient's pain and/or functional impairment.   After discussing the risks, benefits and expected outcomes of the injection and all questions were reviewed and answered, the patient wished to undergo the above named procedure.  Verbal consent was obtained.  The ultrasound was used to identify the target structure and adjacent neurovascular structures. The skin was then prepped in sterile fashion and the target structure was injected under direct visualization using sterile technique as below:  PREP: Alcohol, Ethel Chloride and 1 cc 1% lidocaine on Insulin Needle APPROACH: direct, single injection, 25g 1.5 in. INJECTATE: 0.5 cc 0.5% Marcaine and 0.5 cc 40mg /mL DepoMedrol ASPIRATE: None DRESSING: Band-Aid  Post procedural instructions including recommending icing and warning signs for infection were reviewed.    This procedure was well tolerated and there were no complications.   IMPRESSION: Succesful Ultrasound Guided: Injection

## 2017-12-15 ENCOUNTER — Telehealth: Payer: Self-pay | Admitting: *Deleted

## 2017-12-15 ENCOUNTER — Encounter: Payer: Self-pay | Admitting: Podiatry

## 2017-12-15 DIAGNOSIS — M2012 Hallux valgus (acquired), left foot: Secondary | ICD-10-CM | POA: Diagnosis not present

## 2017-12-15 DIAGNOSIS — M21612 Bunion of left foot: Secondary | ICD-10-CM | POA: Diagnosis not present

## 2017-12-15 NOTE — Telephone Encounter (Signed)
Pharmacist - [facility] states unable to read the zofran rx. I told pharmacist the zofran was 4mg  tid.

## 2017-12-16 ENCOUNTER — Encounter: Payer: Self-pay | Admitting: Podiatry

## 2017-12-17 NOTE — Progress Notes (Signed)
This encounter was created in error - please disregard.

## 2017-12-23 ENCOUNTER — Encounter: Payer: Self-pay | Admitting: Podiatry

## 2017-12-23 ENCOUNTER — Ambulatory Visit (INDEPENDENT_AMBULATORY_CARE_PROVIDER_SITE_OTHER): Payer: Federal, State, Local not specified - PPO | Admitting: Podiatry

## 2017-12-23 ENCOUNTER — Ambulatory Visit (INDEPENDENT_AMBULATORY_CARE_PROVIDER_SITE_OTHER): Payer: Federal, State, Local not specified - PPO

## 2017-12-23 VITALS — BP 138/82 | HR 87 | Temp 98.2°F

## 2017-12-23 DIAGNOSIS — M21619 Bunion of unspecified foot: Secondary | ICD-10-CM

## 2017-12-23 DIAGNOSIS — M21612 Bunion of left foot: Secondary | ICD-10-CM

## 2017-12-23 MED ORDER — OXYCODONE-ACETAMINOPHEN 10-325 MG PO TABS: 1.0000 | ORAL_TABLET | Freq: Three times a day (TID) | ORAL | 0 refills | Status: DC | PRN

## 2017-12-23 NOTE — Progress Notes (Signed)
Subjective:   Patient ID: Mary Sanford, female   DOB: 52 y.o.   MRN: [id number]   HPI Patient states she is having moderate amount of discomfort but overall is doing well with her left foot and states her right foot continues to heal   ROS      Objective:  Physical Exam  Neurovascular status intact negative Homans sign was noted with well-healing surgical site first metatarsal left with wound edges well coapted hallux in rectus position and right foot healing well.  Range of motion is good with no crepitus joint noted but she has not been working it as I had instructed     Assessment:  Patient overall is doing well but does need to work her big toe joints better at the current time with good alignment noted and wound edges well coapted     Plan:  X-rays reviewed with patient and instructed her again on the importance of range of motion and instructed her how to do this properly.  Patient will be seen back again in 3 weeks or earlier if needed and was given a further prescription for Percocet to take at nighttime  X-rays indicate osteotomies are healing well wound edges look good and joints are congruous with fixation in place

## 2017-12-29 DIAGNOSIS — F509 Eating disorder, unspecified: Secondary | ICD-10-CM | POA: Diagnosis not present

## 2018-01-04 ENCOUNTER — Telehealth: Payer: Self-pay | Admitting: Internal Medicine

## 2018-01-04 NOTE — Telephone Encounter (Signed)
Copied from [place] [id number]. Topic: Quick Communication - See Telephone Encounter >> Jan 04, 2018  9:52 AM Hewitt Shorts wrote:  Pt is having an issues with the cost of the medication -saxenda it is over a 1000 and pt is wanting to know if she can come into the office to pick up sample until the cost and be corrected   Best number [phone] Pt is wanting to also talk with Dr. Ronnald Ramp about getting a b12 shot

## 2018-01-05 NOTE — Telephone Encounter (Signed)
Called pt and informed that there is some patient assistance for the saxenda. Pt stated that she will start the process.   Pt would also like to discuss the b12 on her appt on 07/10. Notes added to that appt.

## 2018-01-05 NOTE — Telephone Encounter (Signed)
Pt is having an issues with the cost of the medication -saxenda it is over a 1000 and pt is wanting to know if she can come into the office to pick up sample until the cost and be corrected   Best number [phone] Pt is wanting to also talk with Dr. Ronnald Ramp about getting a b12 shot  Last OV 11/11/17

## 2018-01-06 ENCOUNTER — Ambulatory Visit (INDEPENDENT_AMBULATORY_CARE_PROVIDER_SITE_OTHER): Payer: Federal, State, Local not specified - PPO

## 2018-01-06 ENCOUNTER — Encounter: Payer: Self-pay | Admitting: Podiatry

## 2018-01-06 ENCOUNTER — Ambulatory Visit (INDEPENDENT_AMBULATORY_CARE_PROVIDER_SITE_OTHER): Payer: Federal, State, Local not specified - PPO | Admitting: Podiatry

## 2018-01-06 ENCOUNTER — Other Ambulatory Visit: Payer: Self-pay

## 2018-01-06 DIAGNOSIS — M2012 Hallux valgus (acquired), left foot: Secondary | ICD-10-CM | POA: Diagnosis not present

## 2018-01-06 DIAGNOSIS — M2011 Hallux valgus (acquired), right foot: Secondary | ICD-10-CM

## 2018-01-10 NOTE — Progress Notes (Signed)
Subjective:   Patient ID: Mary Sanford, female   DOB: 52 y.o.   MRN: [id number]   HPI Patient presents stating both feet are sore but overall on doing well and continuing to improve   ROS      Objective:  Physical Exam  Neurovascular status intact negative Homans sign noted wound edges well coapted first metatarsal bilateral good alignment with range of motion adequate first MPJ bilateral     Assessment:  Doing well post osteotomy first metatarsal bilateral     Plan:  H&P x-rays reviewed instructed on continued compression elevation and the importance of range of motion of the first MPJ and begin gradual shoe gear usage on the right foot.  Reappoint in 4 weeks or earlier if any issues should occur  X-rays indicate the osteotomies are healing well fixation in place good alignment with reduction of angle excellent

## 2018-01-19 ENCOUNTER — Telehealth: Payer: Self-pay

## 2018-01-19 ENCOUNTER — Other Ambulatory Visit (HOSPITAL_COMMUNITY): Payer: Self-pay | Admitting: General Surgery

## 2018-01-19 DIAGNOSIS — K2100 Gastro-esophageal reflux disease with esophagitis, without bleeding: Secondary | ICD-10-CM

## 2018-01-19 DIAGNOSIS — E669 Obesity, unspecified: Secondary | ICD-10-CM

## 2018-01-19 NOTE — Telephone Encounter (Signed)
No referral needed - spoke with pt and gave her [facility] number to get set up with them [phone]

## 2018-01-19 NOTE — Telephone Encounter (Signed)
Copied from [place] [id number]. Topic: Referral - Question >> Jan 14, 2018 11:30 AM Antonieta Iba C wrote: Reason for CRM:   Pt currently have a referral with [facility]. Pt says that she has been seen and is unhappy with there service. Pt says that they are very unorganized and seem to not know the process for what she is being seen for. Pt would like to see [facility] instead. Please advise   [phone]

## 2018-01-19 NOTE — Telephone Encounter (Signed)
Referral has been entered for location that patient requested.

## 2018-02-02 ENCOUNTER — Ambulatory Visit (HOSPITAL_COMMUNITY)
Admission: RE | Admit: 2018-02-02 | Discharge: 2018-02-02 | Disposition: A | Discharge status: Home / Self Care | Payer: Federal, State, Local not specified - PPO | Source: Ambulatory Visit | Attending: General Surgery | Admitting: General Surgery

## 2018-02-02 DIAGNOSIS — K219 Gastro-esophageal reflux disease without esophagitis: Secondary | ICD-10-CM | POA: Diagnosis not present

## 2018-02-02 DIAGNOSIS — K2100 Gastro-esophageal reflux disease with esophagitis, without bleeding: Secondary | ICD-10-CM

## 2018-02-02 DIAGNOSIS — Z01818 Encounter for other preprocedural examination: Secondary | ICD-10-CM | POA: Diagnosis not present

## 2018-02-08 ENCOUNTER — Other Ambulatory Visit: Payer: Federal, State, Local not specified - PPO

## 2018-02-08 ENCOUNTER — Ambulatory Visit (INDEPENDENT_AMBULATORY_CARE_PROVIDER_SITE_OTHER): Payer: Federal, State, Local not specified - PPO

## 2018-02-08 ENCOUNTER — Ambulatory Visit (INDEPENDENT_AMBULATORY_CARE_PROVIDER_SITE_OTHER): Payer: Federal, State, Local not specified - PPO | Admitting: Podiatry

## 2018-02-08 ENCOUNTER — Encounter: Payer: Self-pay | Admitting: Podiatry

## 2018-02-08 DIAGNOSIS — M2012 Hallux valgus (acquired), left foot: Secondary | ICD-10-CM

## 2018-02-08 DIAGNOSIS — M21619 Bunion of unspecified foot: Secondary | ICD-10-CM

## 2018-02-08 DIAGNOSIS — M2011 Hallux valgus (acquired), right foot: Secondary | ICD-10-CM

## 2018-02-08 DIAGNOSIS — Z9889 Other specified postprocedural states: Secondary | ICD-10-CM

## 2018-02-08 MED ORDER — MELOXICAM 15 MG PO TABS: 15.0000 mg | ORAL_TABLET | Freq: Every day | ORAL | 1 refills | Status: AC

## 2018-02-10 ENCOUNTER — Ambulatory Visit (INDEPENDENT_AMBULATORY_CARE_PROVIDER_SITE_OTHER): Payer: Federal, State, Local not specified - PPO | Admitting: Internal Medicine

## 2018-02-10 ENCOUNTER — Encounter: Payer: Self-pay | Admitting: Internal Medicine

## 2018-02-10 VITALS — BP 144/94 | HR 84 | Temp 98.3°F | Resp 16 | Ht 69.0 in | Wt 256.0 lb

## 2018-02-10 DIAGNOSIS — F418 Other specified anxiety disorders: Secondary | ICD-10-CM

## 2018-02-10 DIAGNOSIS — R7303 Prediabetes: Secondary | ICD-10-CM

## 2018-02-10 DIAGNOSIS — E669 Obesity, unspecified: Secondary | ICD-10-CM

## 2018-02-10 DIAGNOSIS — I10 Essential (primary) hypertension: Secondary | ICD-10-CM

## 2018-02-10 DIAGNOSIS — J988 Other specified respiratory disorders: Secondary | ICD-10-CM

## 2018-02-10 LAB — POCT GLYCOSYLATED HEMOGLOBIN (HGB A1C): HEMOGLOBIN A1C: 6.2 % — AB (ref 4.0–5.6)

## 2018-02-10 MED ORDER — NEBIVOLOL HCL 5 MG PO TABS: 5.0000 mg | ORAL_TABLET | Freq: Every day | ORAL | 0 refills | Status: DC

## 2018-02-10 MED ORDER — PHENTERMINE HCL 37.5 MG PO TABS: 37.5000 mg | ORAL_TABLET | Freq: Every day | ORAL | 2 refills | Status: DC

## 2018-02-10 MED ORDER — ESCITALOPRAM OXALATE 10 MG PO TABS: 10.0000 mg | ORAL_TABLET | Freq: Every day | ORAL | 1 refills | Status: DC

## 2018-02-10 MED ORDER — CEFDINIR 300 MG PO CAPS: 300.0000 mg | ORAL_CAPSULE | Freq: Two times a day (BID) | ORAL | 0 refills | Status: AC

## 2018-02-10 MED ORDER — LIRAGLUTIDE -WEIGHT MANAGEMENT 18 MG/3ML ~~LOC~~ SOPN: 3.0000 mg | PEN_INJECTOR | Freq: Every day | SUBCUTANEOUS | 5 refills | Status: DC

## 2018-02-10 NOTE — Patient Instructions (Signed)

## 2018-02-10 NOTE — Progress Notes (Signed)
Subjective:  Patient ID: Mary Sanford, female    DOB: August 18, 1965  Age: 52 y.o. MRN: [id number]  CC: Hypertension and Depression   HPI Mary Sanford presents for f/up - She complains that her blood pressure is not well controlled and she is having trouble losing weight.  She is very active but has trouble controlling her intake of calories.  She denies any recent episodes of headache, blurred vision, chest pain, shortness of breath, edema, or fatigue.  Outpatient Medications Prior to Visit  Medication Sig Dispense Refill  . acetaminophen (TYLENOL) 500 MG tablet Take 500 mg every 6 (six) hours as needed by mouth.    Marland Kitchen albuterol (PROVENTIL HFA;VENTOLIN HFA) 108 (90 Base) MCG/ACT inhaler Inhale 1-2 puffs into the lungs every 6 (six) hours as needed for wheezing or shortness of breath. 1 Inhaler 0  . Cyanocobalamin (B-12) 1000 MCG/ML KIT Inject 1,000 mcg as directed once a week.     . hydrochlorothiazide (HYDRODIURIL) 25 MG tablet TAKE 1 TABLET BY MOUTH ONCE DAILY 90 tablet 1  . meloxicam (MOBIC) 15 MG tablet Take 1 tablet (15 mg total) by mouth daily. 60 tablet 1  . potassium chloride (K-DUR,KLOR-CON) 10 MEQ tablet Take 1 tablet (10 mEq total) by mouth 2 (two) times daily. 180 tablet 1  . escitalopram (LEXAPRO) 10 MG tablet     . Multiple Vitamins-Minerals (MULTIVITAMIN ADULT EXTRA C) CHEW Chew 2 tablets by mouth daily.    . diclofenac (VOLTAREN) 75 MG EC tablet Take 1 tablet (75 mg total) by mouth 2 (two) times daily. 50 tablet 2  . Liraglutide -Weight Management (SAXENDA) 18 MG/3ML SOPN Inject 3 mg into the skin daily. 5 pen 5  . ondansetron (ZOFRAN) 4 MG tablet   0  . oxyCODONE-acetaminophen (PERCOCET) 10-325 MG tablet Take 1 tablet by mouth every 8 (eight) hours as needed for pain. 20 tablet 0  . phentermine (ADIPEX-P) 37.5 MG tablet Take 1 tablet (37.5 mg total) by mouth daily before breakfast. (Patient not taking: Reported on 02/10/2018) 30 tablet 2   No facility-administered  medications prior to visit.     ROS Review of Systems  Constitutional: Negative.  Negative for chills, diaphoresis, fatigue and fever.  HENT: Negative.   Eyes: Negative for photophobia and visual disturbance.  Respiratory: Positive for cough. Negative for apnea, chest tightness, shortness of breath and wheezing.        She complains of a 2-week history of cough productive of thick yellow/green phlegm.  Cardiovascular: Negative for chest pain, palpitations and leg swelling.  Gastrointestinal: Negative for abdominal pain, constipation, diarrhea and vomiting.  Genitourinary: Negative.  Negative for difficulty urinating.  Musculoskeletal: Negative.  Negative for back pain, myalgias and neck pain.  Skin: Negative.   Neurological: Negative.  Negative for dizziness, weakness, light-headedness, numbness and headaches.  Hematological: Negative for adenopathy. Does not bruise/bleed easily.  Psychiatric/Behavioral: Negative.  Negative for dysphoric mood, sleep disturbance and suicidal ideas. The patient is not nervous/anxious.     Objective:  BP (!) 144/94 (BP Location: Left Arm, Patient Position: Sitting, Cuff Size: Large)   Pulse 84   Temp 98.3 F (36.8 C) (Oral)   Resp 16   Ht 5' 9" (1.753 m)   Wt 256 lb (116.1 kg)   LMP 01/16/2018 (Within Days)   SpO2 96%   BMI 37.80 kg/m   BP Readings from Last 3 Encounters:  02/10/18 (!) 144/94  12/23/17 138/82  12/09/17 (!) 129/92    Wt Readings from Last  3 Encounters:  02/10/18 256 lb (116.1 kg)  11/11/17 258 lb 8 oz (117.3 kg)  11/03/17 255 lb (115.7 kg)    Physical Exam  Constitutional: She is oriented to person, place, and time. No distress.  HENT:  Mouth/Throat: Oropharynx is clear and moist. No oropharyngeal exudate.  Eyes: Conjunctivae are normal. No scleral icterus.  Neck: Normal range of motion. Neck supple. No JVD present. No thyromegaly present.  Cardiovascular: Normal rate, regular rhythm and normal heart sounds. Exam  reveals no gallop.  No murmur heard. Pulmonary/Chest: Effort normal and breath sounds normal. No respiratory distress. She has no wheezes. She has no rales.  Abdominal: Soft. Normal appearance and bowel sounds are normal. She exhibits no mass. There is no hepatosplenomegaly. There is no tenderness.  Musculoskeletal: Normal range of motion. She exhibits no edema, tenderness or deformity.  Lymphadenopathy:    She has no cervical adenopathy.  Neurological: She is alert and oriented to person, place, and time.  Skin: Skin is warm and dry. She is not diaphoretic. No pallor.  Psychiatric: She has a normal mood and affect. Her behavior is normal. Judgment and thought content normal.  Vitals reviewed.   Lab Results  Component Value Date   WBC 7.6 09/14/2017   HGB 13.8 09/14/2017   HCT 42.0 09/14/2017   PLT 287 09/14/2017   GLUCOSE 103 (H) 11/11/2017   CHOL 135 04/01/2017   TRIG 93.0 04/01/2017   HDL 38.30 (L) 04/01/2017   LDLCALC 78 04/01/2017   ALT 34 09/14/2017   AST 27 09/14/2017   NA 140 11/11/2017   K 3.7 11/11/2017   CL 102 11/11/2017   CREATININE 0.82 11/11/2017   BUN 10 11/11/2017   CO2 32 11/11/2017   TSH 1.21 04/01/2017   HGBA1C 6.2 (A) 02/10/2018    Dg Chest 2 View  Result Date: 02/02/2018 CLINICAL DATA:  Preoperative evaluation for upcoming gastric bypass EXAM: CHEST - 2 VIEW COMPARISON:  04/26/2015 FINDINGS: The heart size and mediastinal contours are within normal limits. Both lungs are clear. The visualized skeletal structures are unremarkable. IMPRESSION: No active cardiopulmonary disease. Electronically Signed   By: Inez Catalina M.D.   On: 02/02/2018 15:36   Dg Duanne Limerick  W/kub  Result Date: 02/02/2018 CLINICAL DATA:  53 year old female. Pre gastric bypass. No complaints. Initial encounter. EXAM: UPPER GI SERIES WITH KUB TECHNIQUE: After obtaining a scout radiograph a routine upper GI series was performed using thin barium per protocol. FLUOROSCOPY TIME:  Fluoroscopy Time:   1 minutes and 12 seconds. Radiation Exposure Index: 24.9 mGy. COMPARISON:  None. FINDINGS: Scout view unremarkable. Normal primary esophageal stripping wave without esophageal obstructing lesion or mucosa abnormality. Trace reflux into the distal esophagus with change of patient position. Gastric contour within normal limits without ulceration or mass identified on this single contrast exam. Duodenal bulb of normal configuration without ulceration. Proximally visualized small bowel within normal limits. IMPRESSION: Trace gastroesophageal reflux with change of patient position. Otherwise negative single contrast upper GI series. Electronically Signed   By: Genia Del M.D.   On: 02/02/2018 12:14    Assessment & Plan:   [place] was seen today for hypertension and depression.  Diagnoses and all orders for this visit:  Essential hypertension, benign- Her blood pressure is not adequately well controlled.  She will add nebivolol to the diuretic. -     nebivolol (BYSTOLIC) 5 MG tablet; Take 1 tablet (5 mg total) by mouth daily.  Obesity (BMI 30-39.9) -     phentermine (  ADIPEX-P) 37.5 MG tablet; Take 1 tablet (37.5 mg total) by mouth daily before breakfast. -     Liraglutide -Weight Management (SAXENDA) 18 MG/3ML SOPN; Inject 3 mg into the skin daily.  Prediabetes -     POCT glycosylated hemoglobin (Hb A1C)  Depression with anxiety -     escitalopram (LEXAPRO) 10 MG tablet; Take 1 tablet (10 mg total) by mouth daily.  RTI (respiratory tract infection)- I will treat the infection with a broad spectrum cephalosporin. -     cefdinir (OMNICEF) 300 MG capsule; Take 1 capsule (300 mg total) by mouth 2 (two) times daily for 10 days.   I have discontinued Eliseo Gum. Bouley's MULTIVITAMIN ADULT EXTRA C, diclofenac, oxyCODONE-acetaminophen, and ondansetron. I have also changed her escitalopram. Additionally, I am having her start on cefdinir and nebivolol. Lastly, I am having her maintain her albuterol,  acetaminophen, potassium chloride, B-12, hydrochlorothiazide, meloxicam, phentermine, and Liraglutide -Weight Management.  Meds ordered this encounter  Medications  . phentermine (ADIPEX-P) 37.5 MG tablet    Sig: Take 1 tablet (37.5 mg total) by mouth daily before breakfast.    Dispense:  30 tablet    Refill:  2  . escitalopram (LEXAPRO) 10 MG tablet    Sig: Take 1 tablet (10 mg total) by mouth daily.    Dispense:  90 tablet    Refill:  1  . Liraglutide -Weight Management (SAXENDA) 18 MG/3ML SOPN    Sig: Inject 3 mg into the skin daily.    Dispense:  5 pen    Refill:  5  . cefdinir (OMNICEF) 300 MG capsule    Sig: Take 1 capsule (300 mg total) by mouth 2 (two) times daily for 10 days.    Dispense:  20 capsule    Refill:  0  . nebivolol (BYSTOLIC) 5 MG tablet    Sig: Take 1 tablet (5 mg total) by mouth daily.    Dispense:  90 tablet    Refill:  0     Follow-up: Return in about 3 months (around 05/13/2018).  Scarlette Calico, MD

## 2018-02-11 NOTE — Progress Notes (Signed)
   Subjective:  Patient presents today status post bilateral bunionectomy. DOS: 12/15/17. She states she is doing very well. She is now wearing regular shoes and denies any pain. There are no modifying factors noted. She denies any new complaints at this time. Patient is here for further evaluation and treatment.    Past Medical History:  Diagnosis Date  . Allergy   . Anemia   . Anxiety   . Arthritis    arm/ wrist areas  . B12 deficiency   . Constipation    not chronic- now stools soft and regular   . Cough    non- productive  . Depression    no meds  . Hypertension   . Kidney stones       Objective/Physical Exam Neurovascular status intact.  Skin incisions appear to be well coapted. No sign of infectious process noted. No dehiscence. No active bleeding noted. Moderate edema noted to the surgical extremity.  Radiographic Exam:  Orthopedic hardware and osteotomies sites appear to be stable with routine healing.  Assessment: 1. s/p bilateral bunionectomy. DOS: 12/15/17   Plan of Care:  1. Patient was evaluated. X-rays reviewed 2. Continue wearing compression anklets. 3. Recommended good shoe gear.  4. Prescription for Meloxicam 15 mg provided to patient.  5. Return to clinic as needed.    Edrick Kins, DPM [facility]  Dr. Edrick Kins, [address]                                        [place], [place] [address]                Office [phone]  Fax [phone]

## 2018-04-28 ENCOUNTER — Ambulatory Visit (INDEPENDENT_AMBULATORY_CARE_PROVIDER_SITE_OTHER): Payer: Federal, State, Local not specified - PPO

## 2018-04-28 ENCOUNTER — Encounter: Payer: Self-pay | Admitting: Podiatry

## 2018-04-28 ENCOUNTER — Ambulatory Visit: Payer: Federal, State, Local not specified - PPO | Admitting: Podiatry

## 2018-04-28 DIAGNOSIS — M722 Plantar fascial fibromatosis: Secondary | ICD-10-CM

## 2018-04-28 DIAGNOSIS — M2012 Hallux valgus (acquired), left foot: Secondary | ICD-10-CM

## 2018-04-28 MED ORDER — TRIAMCINOLONE ACETONIDE 10 MG/ML IJ SUSP
10.0000 mg | Freq: Once | INTRAMUSCULAR | Status: AC
  Administered 2018-04-28: 10 mg

## 2018-05-01 NOTE — Progress Notes (Signed)
Subjective:   Patient ID: Mary Sanford, female   DOB: 52 y.o.   MRN: [id number]   HPI Patient states overall doing very well but I had a reoccurrence of heel pain right and I am satisfied with my bunion but at times it hurts on the surgical site if I do too much   ROS      Objective:  Physical Exam  Neurovascular status intact negative Homans sign noted with exquisite discomfort plantar fascial right with incisions of healed well bilateral with good alignment with mild swelling still first MPJ left over right     Assessment:  Doing well overall post osteotomy first metatarsal bilateral with acute plantar fasciitis right     Plan:  H&P conditions x-rays reviewed today I injected the plantar fascia right 3 mg Kenalog 5 mg Xylocaine gave instructions for physical therapy and that the mild inflammation should reduce completely over time  X-ray indicates the osteotomy is healing well fixation in place with small spur formation right

## 2018-05-05 DIAGNOSIS — R1084 Generalized abdominal pain: Secondary | ICD-10-CM | POA: Diagnosis not present

## 2018-05-05 DIAGNOSIS — Z87442 Personal history of urinary calculi: Secondary | ICD-10-CM | POA: Diagnosis not present

## 2018-05-05 DIAGNOSIS — M545 Low back pain: Secondary | ICD-10-CM | POA: Diagnosis not present

## 2018-05-13 ENCOUNTER — Ambulatory Visit
Admission: RE | Admit: 2018-05-13 | Discharge: 2018-05-13 | Disposition: A | Discharge status: Home / Self Care | Payer: Federal, State, Local not specified - PPO | Source: Ambulatory Visit

## 2018-05-13 ENCOUNTER — Other Ambulatory Visit: Payer: Self-pay | Admitting: Family Medicine

## 2018-05-13 ENCOUNTER — Other Ambulatory Visit: Payer: Self-pay | Admitting: Internal Medicine

## 2018-05-13 DIAGNOSIS — Z1231 Encounter for screening mammogram for malignant neoplasm of breast: Secondary | ICD-10-CM

## 2018-05-13 DIAGNOSIS — N200 Calculus of kidney: Secondary | ICD-10-CM | POA: Diagnosis not present

## 2018-05-13 DIAGNOSIS — R1084 Generalized abdominal pain: Secondary | ICD-10-CM | POA: Diagnosis not present

## 2018-05-13 LAB — HM MAMMOGRAPHY

## 2018-05-14 ENCOUNTER — Ambulatory Visit (INDEPENDENT_AMBULATORY_CARE_PROVIDER_SITE_OTHER): Payer: Federal, State, Local not specified - PPO

## 2018-05-14 ENCOUNTER — Encounter: Payer: Self-pay | Admitting: Podiatry

## 2018-05-14 ENCOUNTER — Ambulatory Visit: Payer: Federal, State, Local not specified - PPO | Admitting: Podiatry

## 2018-05-14 ENCOUNTER — Other Ambulatory Visit: Payer: Self-pay | Admitting: Podiatry

## 2018-05-14 DIAGNOSIS — M2012 Hallux valgus (acquired), left foot: Secondary | ICD-10-CM | POA: Diagnosis not present

## 2018-05-15 NOTE — Progress Notes (Signed)
Subjective: Mary Sanford is a pleasant 52 y.o. AAF, who presents today s/p bunionectomy left foot on 11/24/2017.  Today, she states she may have stubbed her left great toe on her granddaugher's bed. She states pain is 10/10.  She states increased pain is located near the proximal end of her scar and it started yesterday when she was wearing flat sandals.  She has returned to work and today she is wearing [employer] flat sneakers with no arch support.  She also states she has been wearing Technical sales engineer.  She is very anxious and feels she may have damaged the  fixation of her bunionectomy.  She denies any fever, chills, nightsweats, nausea or vomiting.  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  Objective:  Neurovascular status intact left foot  Dermatological Examination: Skin with normal turgor, texture and tone  Linear scar noted along left 1st MPJ. No dehiscence. No erythema. No warmth. No signs of abscess. No palpable hardware.  There is hypersensitivity noted along proximal aspect of scar.  Musculoskeletal: Muscle strength 5/5 to all LE muscle groups Good ROM at left 1st MPJ. No crepitus, no restriction of joint.  Xrays left foot show no displacement of K-wires. Osteotomy healing well. Soft tissue edema decreased from last xray on 04/28/2018.  Assessment: S/p Bunionectomy left foot with k-wire fixation with episode of  trauma to foot  Plan: 1. Assured  Mary Sanford her fixation remains intact to bunionectomy and osteotomy is healing fine 2. Discussed her shoe gear choice may also be contributing to her pain as she is putting stress on her 1st metatarsal wearing flat shoe. I recommended she purchase sneakers with arch support/shock absorption such as [employer] 600 series or higher.She is to take it easy this weekend and rest. 3. Ice area 15 minutes day at the end of her work day 4. She may continue anti-inflammatory such as  ibuprofen 5. Elevate left foot when at rest and wear her compression sock 6. She is to see Dr. Paulla Dolly in one week 7. Mary Sanford related understanding of our discussion on today.  She understood above instructions and appeared relieved at end of visit.

## 2018-05-17 ENCOUNTER — Encounter (HOSPITAL_COMMUNITY): Payer: Self-pay

## 2018-05-17 ENCOUNTER — Other Ambulatory Visit: Payer: Self-pay

## 2018-05-17 ENCOUNTER — Emergency Department (HOSPITAL_COMMUNITY): Payer: Federal, State, Local not specified - PPO

## 2018-05-17 ENCOUNTER — Emergency Department (HOSPITAL_COMMUNITY)
Admission: EM | Admit: 2018-05-17 | Discharge: 2018-05-17 | Disposition: A | Discharge status: Home / Self Care | Payer: Federal, State, Local not specified - PPO | Attending: Emergency Medicine | Admitting: Emergency Medicine

## 2018-05-17 DIAGNOSIS — Z87891 Personal history of nicotine dependence: Secondary | ICD-10-CM | POA: Insufficient documentation

## 2018-05-17 DIAGNOSIS — J069 Acute upper respiratory infection, unspecified: Secondary | ICD-10-CM | POA: Diagnosis not present

## 2018-05-17 DIAGNOSIS — G44209 Tension-type headache, unspecified, not intractable: Secondary | ICD-10-CM | POA: Insufficient documentation

## 2018-05-17 DIAGNOSIS — I10 Essential (primary) hypertension: Secondary | ICD-10-CM | POA: Diagnosis not present

## 2018-05-17 DIAGNOSIS — Z79899 Other long term (current) drug therapy: Secondary | ICD-10-CM | POA: Insufficient documentation

## 2018-05-17 DIAGNOSIS — R51 Headache: Secondary | ICD-10-CM | POA: Diagnosis not present

## 2018-05-17 DIAGNOSIS — R05 Cough: Secondary | ICD-10-CM | POA: Diagnosis not present

## 2018-05-17 MED ORDER — ACETAMINOPHEN 325 MG PO TABS
650.0000 mg | ORAL_TABLET | ORAL | Status: DC | PRN
  Administered 2018-05-17: 650 mg via ORAL
  Filled 2018-05-17: qty 2

## 2018-05-17 MED ORDER — KETOROLAC TROMETHAMINE 60 MG/2ML IM SOLN
60.0000 mg | Freq: Once | INTRAMUSCULAR | Status: AC
  Administered 2018-05-17: 60 mg via INTRAMUSCULAR
  Filled 2018-05-17: qty 2

## 2018-05-17 MED ORDER — FLUTICASONE PROPIONATE 50 MCG/ACT NA SUSP: 2.0000 | Freq: Every day | NASAL | 0 refills | Status: DC

## 2018-05-17 MED ORDER — HYDROCHLOROTHIAZIDE 12.5 MG PO CAPS
25.0000 mg | ORAL_CAPSULE | Freq: Once | ORAL | Status: AC
  Administered 2018-05-17: 25 mg via ORAL
  Filled 2018-05-17: qty 2

## 2018-05-17 MED ORDER — METOCLOPRAMIDE HCL 10 MG PO TABS
10.0000 mg | ORAL_TABLET | Freq: Once | ORAL | Status: AC
  Administered 2018-05-17: 10 mg via ORAL
  Filled 2018-05-17: qty 1

## 2018-05-17 MED ORDER — BENZONATATE 100 MG PO CAPS: 100.0000 mg | ORAL_CAPSULE | Freq: Three times a day (TID) | ORAL | 0 refills | Status: DC | PRN

## 2018-05-17 MED ORDER — METOCLOPRAMIDE HCL 10 MG PO TABS: 10.0000 mg | ORAL_TABLET | Freq: Four times a day (QID) | ORAL | 0 refills | Status: DC | PRN

## 2018-05-17 NOTE — ED Triage Notes (Signed)
Patient c/o body aches and headache and 1 wek, non productive cough and dizziness since yesterday.  Patient was hypertensive in triage, but has not taken BP meds today.  Patient states she has been taking Tylenol with no relief.

## 2018-05-17 NOTE — ED Provider Notes (Signed)
[facility] DEPT Provider Note   CSN: [id number] Arrival date & time: 05/17/18  1017     History   Chief Complaint Chief Complaint  Patient presents with  . Generalized Body Aches  . Cough  . Hypertension    HPI Mary Sanford is a 52 y.o. female.  HPI Patient presents with 1 week of right-sided headache, nasal congestion, sore throat, nonproductive cough and generalized myalgias.  States she has a niece with URI.  She denies any photophobia or visual changes.  Denies focal weakness or numbness.  States the headache is gradual onset in the right temporal region.  Worse with palpation.  Denies any known trauma.  No known fever or chills.  No neck pain or stiffness.  Patient did not take her blood pressure medication today. Past Medical History:  Diagnosis Date  . Allergy   . Anemia   . Anxiety   . Arthritis    arm/ wrist areas  . B12 deficiency   . Constipation    not chronic- now stools soft and regular   . Cough    non- productive  . Depression    no meds  . Hypertension   . Kidney stones     Patient Active Problem List   Diagnosis Date Noted  . RTI (respiratory tract infection) 02/10/2018  . ASCUS pap with negative high risk HPV on 06/30/16 06/30/2016  . Depression with anxiety 12/28/2015  . De Quervain's tenosynovitis, left 06/21/2015  . Prediabetes 11/02/2014  . Anemia, iron deficiency 09/17/2013  . Routine general medical examination at a health care facility 01/12/2013  . Allergic rhinitis 01/12/2013  . Constipation, chronic 10/04/2012  . Visit for screening mammogram 01/12/2012  . B12 deficiency anemia 01/09/2012  . Obesity (BMI 30-39.9) 01/09/2012  . Essential hypertension, benign 11/24/2011    Past Surgical History:  Procedure Laterality Date  . BUNIONECTOMY Bilateral   . caesarean section    . cystoscopic     extraction of ureteric calculus without disintegration  . DILITATION & CURRETTAGE/HYSTROSCOPY WITH  NOVASURE ABLATION N/A 06/17/2013   Procedure: DILATATION & CURETTAGE/HYSTEROSCOPY WITH NOVASURE ABLATION;  Surgeon: Osborne Oman, MD;  Location: [facility];  Service: Gynecology;  Laterality: N/A;  . HEMORRHOIDECTOMY WITH HEMORRHOID BANDING  10/19/2012   SCA THD Hem ligation/pexy  . TUBAL LIGATION    . WISDOM TOOTH EXTRACTION       OB History    Gravida  7   Para  5   Term  4   Preterm  1   AB  2   Living  5     SAB  0   TAB  2   Ectopic  0   Multiple  0   Live Births  5            Home Medications    Prior to Admission medications   Medication Sig Start Date End Date Taking? Authorizing Provider  acetaminophen (TYLENOL) 500 MG tablet Take 1,000 mg by mouth every 6 (six) hours as needed for mild pain.    Yes [provider]  escitalopram (LEXAPRO) 10 MG tablet Take 1 tablet (10 mg total) by mouth daily. 02/10/18  Yes [name], MD  hydrochlorothiazide (HYDRODIURIL) 25 MG tablet TAKE 1 TABLET BY MOUTH ONCE DAILY Patient taking differently: Take 25 mg by mouth daily.  10/15/17  Yes [name], MD  phentermine (ADIPEX-P) 37.5 MG tablet Take 1 tablet (37.5 mg total) by mouth daily before  breakfast. 02/10/18  Yes [name], MD  potassium chloride (K-DUR,KLOR-CON) 10 MEQ tablet Take 1 tablet (10 mEq total) by mouth 2 (two) times daily. Patient taking differently: Take 10 mEq by mouth daily.  07/23/17  Yes [name], MD  albuterol (PROVENTIL HFA;VENTOLIN HFA) 108 (90 Base) MCG/ACT inhaler Inhale 1-2 puffs into the lungs every 6 (six) hours as needed for wheezing or shortness of breath. 10/24/16   Nche, Charlene Brooke, NP  benzonatate (TESSALON) 100 MG capsule Take 1 capsule (100 mg total) by mouth 3 (three) times daily as needed for cough. 05/17/18   Julianne Rice, MD  fluticasone (FLONASE) 50 MCG/ACT nasal spray Place 2 sprays into both nostrils daily. 05/17/18   Julianne Rice, MD  Liraglutide -Weight Management (SAXENDA) 18 MG/3ML SOPN  Inject 3 mg into the skin daily. Patient not taking: Reported on 05/17/2018 02/10/18   [name], MD  metoCLOPramide (REGLAN) 10 MG tablet Take 1 tablet (10 mg total) by mouth every 6 (six) hours as needed for nausea. 05/17/18   Julianne Rice, MD  nebivolol (BYSTOLIC) 5 MG tablet Take 1 tablet (5 mg total) by mouth daily. Patient not taking: Reported on 05/17/2018 02/10/18   [name], MD  potassium chloride (K-DUR) 10 MEQ tablet Take 1 tablet (10 mEq total) by mouth 2 (two) times daily. 05/13/12 06/27/13  [name], MD    Family History Family History  Problem Relation Age of Onset  . Hypertension Mother   . Hypothyroidism Mother   . Asthma Mother   . Cancer Mother 56       Colon and rectal  . Rectal cancer Mother 86  . Colon cancer Mother 6  . Hypotension Sister   . Hypothyroidism Sister   . Diabetes Sister   . Seizures Brother   . Hypothyroidism Sister   . Seizures Sister   . Anemia Sister   . Asthma Unknown   . Hypothyroidism Unknown   . Heart disease Paternal Grandfather        >55  . Stroke Maternal Uncle        > 55  . Deep vein thrombosis Sister   . Early death Neg Hx   . Hyperlipidemia Neg Hx   . Kidney disease Neg Hx   . Colon polyps Neg Hx   . Esophageal cancer Neg Hx   . Stomach cancer Neg Hx     Social History Social History   Tobacco Use  . Smoking status: Former Smoker    Types: Cigarettes    Last attempt to quit: 08/05/1999    Years since quitting: 18.7  . Smokeless tobacco: Never Used  Substance Use Topics  . Alcohol use: Yes    Comment: socially  . Drug use: No     Allergies   Naproxen   Review of Systems Review of Systems  Constitutional: Negative for chills and fever.  HENT: Positive for congestion and sore throat. Negative for ear pain and trouble swallowing.   Eyes: Negative for photophobia and visual disturbance.  Respiratory: Positive for cough. Negative for chest tightness, shortness of breath and wheezing.     Cardiovascular: Negative for chest pain.  Gastrointestinal: Negative for abdominal pain, constipation, diarrhea, nausea and vomiting.  Genitourinary: Negative for dysuria, flank pain and frequency.  Musculoskeletal: Positive for myalgias. Negative for arthralgias, neck pain and neck stiffness.  Skin: Negative for rash and wound.  Neurological: Positive for headaches. Negative for dizziness, weakness, light-headedness and numbness.  All other  systems reviewed and are negative.    Physical Exam Updated Vital Signs BP (!) 173/115   Pulse 86   Temp 98.2 F (36.8 C) (Oral)   Resp 16   Ht 5\' 9"  (1.753 m)   Wt 115.2 kg   LMP 05/14/2018   SpO2 98%   BMI 37.51 kg/m   Physical Exam  Constitutional: She is oriented to person, place, and time. She appears well-developed and well-nourished. No distress.  HENT:  Head: Normocephalic and atraumatic.  Mouth/Throat: Oropharynx is clear and moist.  Bilateral nasal mucosal edema.  No definite sinus tenderness to percussion.  Patient does have right-sided temporal scalp tenderness to palpation.  Oropharynx is mildly erythematous.  Uterus midline.  No tonsillar exudates.  Eyes: Pupils are equal, round, and reactive to light. EOM are normal.  Neck: Normal range of motion. Neck supple.  No lymphadenopathy.  No meningismus.  Cardiovascular: Normal rate and regular rhythm. Exam reveals no gallop and no friction rub.  No murmur heard. Pulmonary/Chest: Effort normal and breath sounds normal. No stridor. No respiratory distress. She has no wheezes. She has no rales. She exhibits no tenderness.  Abdominal: Soft. Bowel sounds are normal. There is no tenderness. There is no rebound and no guarding.  Musculoskeletal: Normal range of motion. She exhibits no edema or tenderness.  No lower extremity swelling, asymmetry or tenderness.  No midline thoracic or lumbar tenderness.  Neurological: She is alert and oriented to person, place, and time.  5/5 motor all  extremities.  Sensation fully intact.  Ambulating without difficulty.  Skin: Skin is warm and dry. Capillary refill takes less than 2 seconds. No rash noted. She is not diaphoretic. No erythema.  Psychiatric: She has a normal mood and affect. Her behavior is normal.  Nursing note and vitals reviewed.    ED Treatments / Results  Labs (all labs ordered are listed, but only abnormal results are displayed) Labs Reviewed - No data to display  EKG None  Radiology Dg Chest 2 View  Result Date: 05/17/2018 CLINICAL DATA:  Cough, headache and elevated blood pressure. EXAM: CHEST - 2 VIEW COMPARISON:  PA and lateral chest 04/26/2015 and 02/02/2018. FINDINGS: The lungs are clear. Heart size is normal. There is no pneumothorax or pleural fluid. No acute or focal bony abnormality. IMPRESSION: Negative chest. Electronically Signed   By: Inge Rise M.D.   On: 05/17/2018 12:13    Procedures Procedures (including critical care time)  Medications Ordered in ED Medications  acetaminophen (TYLENOL) tablet 650 mg (650 mg Oral Given 05/17/18 1149)  hydrochlorothiazide (MICROZIDE) capsule 25 mg (25 mg Oral Given 05/17/18 1149)  ketorolac (TORADOL) injection 60 mg (60 mg Intramuscular Given 05/17/18 1150)  metoCLOPramide (REGLAN) tablet 10 mg (10 mg Oral Given 05/17/18 1149)     Initial Impression / Assessment and Plan / ED Course  I have reviewed the triage vital signs and the nursing notes.  Pertinent labs & imaging results that were available during my care of the patient were reviewed by me and considered in my medical decision making (see chart for details).     No red flag signs or symptoms regarding patient's headache.  Normal neurologic exam.  Do not believe that neuroimaging is necessary at this point.  Will treat symptomatically. Patient states her headache is significantly improved.  Suspect headache may be related to stress, elevated blood pressure or sinus disease.  Return  precautions have been given. Final Clinical Impressions(s) / ED Diagnoses   Final diagnoses:  Upper respiratory tract infection, unspecified type  Acute non intractable tension-type headache    ED Discharge Orders         Ordered    metoCLOPramide (REGLAN) 10 MG tablet  Every 6 hours PRN     05/17/18 1335    fluticasone (FLONASE) 50 MCG/ACT nasal spray  Daily     05/17/18 1335    benzonatate (TESSALON) 100 MG capsule  3 times daily PRN     05/17/18 1337           Julianne Rice, MD 05/17/18 1345

## 2018-05-19 ENCOUNTER — Ambulatory Visit: Payer: Federal, State, Local not specified - PPO | Admitting: Podiatry

## 2018-05-20 ENCOUNTER — Emergency Department (HOSPITAL_COMMUNITY)
Admission: EM | Admit: 2018-05-20 | Discharge: 2018-05-20 | Disposition: A | Discharge status: Home / Self Care | Payer: Federal, State, Local not specified - PPO | Attending: Emergency Medicine | Admitting: Emergency Medicine

## 2018-05-20 ENCOUNTER — Encounter (HOSPITAL_COMMUNITY): Payer: Self-pay | Admitting: Emergency Medicine

## 2018-05-20 ENCOUNTER — Emergency Department (HOSPITAL_COMMUNITY): Payer: Federal, State, Local not specified - PPO

## 2018-05-20 DIAGNOSIS — R1032 Left lower quadrant pain: Secondary | ICD-10-CM | POA: Diagnosis not present

## 2018-05-20 DIAGNOSIS — Z87891 Personal history of nicotine dependence: Secondary | ICD-10-CM | POA: Insufficient documentation

## 2018-05-20 DIAGNOSIS — R1012 Left upper quadrant pain: Secondary | ICD-10-CM | POA: Insufficient documentation

## 2018-05-20 DIAGNOSIS — Z79899 Other long term (current) drug therapy: Secondary | ICD-10-CM | POA: Diagnosis not present

## 2018-05-20 DIAGNOSIS — I10 Essential (primary) hypertension: Secondary | ICD-10-CM | POA: Diagnosis not present

## 2018-05-20 LAB — URINALYSIS, ROUTINE W REFLEX MICROSCOPIC
BILIRUBIN URINE: NEGATIVE
GLUCOSE, UA: NEGATIVE mg/dL
Ketones, ur: NEGATIVE mg/dL
Leukocytes, UA: NEGATIVE
Nitrite: NEGATIVE
PH: 6 (ref 5.0–8.0)
Protein, ur: 100 mg/dL — AB
RBC / HPF: >50 RBC/hpf — ABNORMAL HIGH (ref 0–5)
SPECIFIC GRAVITY, URINE: 1.017 (ref 1.005–1.030)

## 2018-05-20 LAB — COMPREHENSIVE METABOLIC PANEL
ALK PHOS: 65 U/L (ref 38–126)
ALT: 66 U/L — AB (ref 0–44)
AST: 49 U/L — ABNORMAL HIGH (ref 15–41)
Albumin: 3.7 g/dL (ref 3.5–5.0)
Anion gap: 11 (ref 5–15)
BILIRUBIN TOTAL: 0.4 mg/dL (ref 0.3–1.2)
BUN: 7 mg/dL (ref 6–20)
CALCIUM: 9.8 mg/dL (ref 8.9–10.3)
CO2: 26 mmol/L (ref 22–32)
CREATININE: 0.99 mg/dL (ref 0.44–1.00)
Chloride: 101 mmol/L (ref 98–111)
GFR calc non Af Amer: >60 mL/min (ref >60)
GLUCOSE: 122 mg/dL — AB (ref 70–99)
Potassium: 3.4 mmol/L — ABNORMAL LOW (ref 3.5–5.1)
Sodium: 138 mmol/L (ref 135–145)
TOTAL PROTEIN: 6.9 g/dL (ref 6.5–8.1)

## 2018-05-20 LAB — CBC
HCT: 46.6 % — ABNORMAL HIGH (ref 36.0–46.0)
Hemoglobin: 14.5 g/dL (ref 12.0–15.0)
MCH: 22.7 pg — AB (ref 26.0–34.0)
MCHC: 31.1 g/dL (ref 30.0–36.0)
MCV: 72.8 fL — ABNORMAL LOW (ref 80.0–100.0)
PLATELETS: 287 10*3/uL (ref 150–400)
RBC: 6.4 MIL/uL — AB (ref 3.87–5.11)
RDW: 14.1 % (ref 11.5–15.5)
WBC: 6 10*3/uL (ref 4.0–10.5)
nRBC: 0 % (ref 0.0–0.2)

## 2018-05-20 LAB — I-STAT BETA HCG BLOOD, ED (MC, WL, AP ONLY): I-stat hCG, quantitative: <5 m[IU]/mL (ref <5)

## 2018-05-20 LAB — LIPASE, BLOOD: Lipase: 36 U/L (ref 11–51)

## 2018-05-20 MED ORDER — MORPHINE SULFATE (PF) 4 MG/ML IV SOLN
4.0000 mg | Freq: Once | INTRAVENOUS | Status: AC
  Administered 2018-05-20: 4 mg via INTRAVENOUS
  Filled 2018-05-20: qty 1

## 2018-05-20 MED ORDER — DICYCLOMINE HCL 20 MG PO TABS: 20.0000 mg | ORAL_TABLET | Freq: Two times a day (BID) | ORAL | 0 refills | Status: DC

## 2018-05-20 MED ORDER — DIPHENHYDRAMINE HCL 50 MG/ML IJ SOLN
12.5000 mg | Freq: Once | INTRAMUSCULAR | Status: AC
  Administered 2018-05-20: 12.5 mg via INTRAVENOUS
  Filled 2018-05-20: qty 1

## 2018-05-20 MED ORDER — METOCLOPRAMIDE HCL 5 MG/ML IJ SOLN
10.0000 mg | Freq: Once | INTRAMUSCULAR | Status: AC
  Administered 2018-05-20: 10 mg via INTRAVENOUS
  Filled 2018-05-20: qty 2

## 2018-05-20 NOTE — Discharge Instructions (Signed)
Take Bentyl as prescribed. Follow up with your primary care provider for recheck this week. REturn to the emergency department with any severe pain, high fever, vomiting or new concern.

## 2018-05-20 NOTE — ED Triage Notes (Signed)
Patient reports LLQ pain onset 2 days ago , denies emesis or diarrhea , no fever or chills .

## 2018-05-20 NOTE — ED Provider Notes (Signed)
[facility] EMERGENCY DEPARTMENT Provider Note   CSN: [id number] Arrival date & time: 05/20/18  4034     History   Chief Complaint Chief Complaint  Patient presents with  . Abdominal Pain    HPI Mary Sanford is a 52 y.o. female.  Patient to ED for evaluation of sharp, severe LUQ abdominal pain x 2 days, getting progressively worse. The pain is constant, radiates into the back. No fever, nausea or vomiting. The patient reports she tried an enema at home and produced a bowel movement but this did not change the character of the pain at all. She is passing gas but has little belching. No history of similar symptoms. No chest pain, SOB.   The history is provided by the patient. No language interpreter was used.  Abdominal Pain   Pertinent negatives include fever, nausea, vomiting and constipation.    Past Medical History:  Diagnosis Date  . Allergy   . Anemia   . Anxiety   . Arthritis    arm/ wrist areas  . B12 deficiency   . Constipation    not chronic- now stools soft and regular   . Cough    non- productive  . Depression    no meds  . Hypertension   . Kidney stones     Patient Active Problem List   Diagnosis Date Noted  . RTI (respiratory tract infection) 02/10/2018  . ASCUS pap with negative high risk HPV on 06/30/16 06/30/2016  . Depression with anxiety 12/28/2015  . De Quervain's tenosynovitis, left 06/21/2015  . Prediabetes 11/02/2014  . Anemia, iron deficiency 09/17/2013  . Routine general medical examination at a health care facility 01/12/2013  . Allergic rhinitis 01/12/2013  . Constipation, chronic 10/04/2012  . Visit for screening mammogram 01/12/2012  . B12 deficiency anemia 01/09/2012  . Obesity (BMI 30-39.9) 01/09/2012  . Essential hypertension, benign 11/24/2011    Past Surgical History:  Procedure Laterality Date  . BUNIONECTOMY Bilateral   . caesarean section    . cystoscopic     extraction of ureteric calculus  without disintegration  . DILITATION & CURRETTAGE/HYSTROSCOPY WITH NOVASURE ABLATION N/A 06/17/2013   Procedure: DILATATION & CURETTAGE/HYSTEROSCOPY WITH NOVASURE ABLATION;  Surgeon: Osborne Oman, MD;  Location: [facility];  Service: Gynecology;  Laterality: N/A;  . HEMORRHOIDECTOMY WITH HEMORRHOID BANDING  10/19/2012   SCA THD Hem ligation/pexy  . TUBAL LIGATION    . WISDOM TOOTH EXTRACTION       OB History    Gravida  7   Para  5   Term  4   Preterm  1   AB  2   Living  5     SAB  0   TAB  2   Ectopic  0   Multiple  0   Live Births  5            Home Medications    Prior to Admission medications   Medication Sig Start Date End Date Taking? Authorizing Provider  acetaminophen (TYLENOL) 500 MG tablet Take 1,000 mg by mouth every 6 (six) hours as needed for mild pain.     [provider]  albuterol (PROVENTIL HFA;VENTOLIN HFA) 108 (90 Base) MCG/ACT inhaler Inhale 1-2 puffs into the lungs every 6 (six) hours as needed for wheezing or shortness of breath. 10/24/16   Nche, Charlene Brooke, NP  benzonatate (TESSALON) 100 MG capsule Take 1 capsule (100 mg total) by mouth 3 (three) times daily as  needed for cough. 05/17/18   Julianne Rice, MD  escitalopram (LEXAPRO) 10 MG tablet Take 1 tablet (10 mg total) by mouth daily. 02/10/18   [name], MD  fluticasone (FLONASE) 50 MCG/ACT nasal spray Place 2 sprays into both nostrils daily. 05/17/18   Julianne Rice, MD  hydrochlorothiazide (HYDRODIURIL) 25 MG tablet TAKE 1 TABLET BY MOUTH ONCE DAILY Patient taking differently: Take 25 mg by mouth daily.  10/15/17   [name], MD  Liraglutide -Weight Management (SAXENDA) 18 MG/3ML SOPN Inject 3 mg into the skin daily. Patient not taking: Reported on 05/17/2018 02/10/18   [name], MD  metoCLOPramide (REGLAN) 10 MG tablet Take 1 tablet (10 mg total) by mouth every 6 (six) hours as needed for nausea. 05/17/18   Julianne Rice, MD  nebivolol (BYSTOLIC) 5  MG tablet Take 1 tablet (5 mg total) by mouth daily. Patient not taking: Reported on 05/17/2018 02/10/18   [name], MD  phentermine (ADIPEX-P) 37.5 MG tablet Take 1 tablet (37.5 mg total) by mouth daily before breakfast. 02/10/18   [name], MD  potassium chloride (K-DUR,KLOR-CON) 10 MEQ tablet Take 1 tablet (10 mEq total) by mouth 2 (two) times daily. Patient taking differently: Take 10 mEq by mouth daily.  07/23/17   [name], MD  potassium chloride (K-DUR) 10 MEQ tablet Take 1 tablet (10 mEq total) by mouth 2 (two) times daily. 05/13/12 06/27/13  [name], MD    Family History Family History  Problem Relation Age of Onset  . Hypertension Mother   . Hypothyroidism Mother   . Asthma Mother   . Cancer Mother 44       Colon and rectal  . Rectal cancer Mother 44  . Colon cancer Mother 41  . Hypotension Sister   . Hypothyroidism Sister   . Diabetes Sister   . Seizures Brother   . Hypothyroidism Sister   . Seizures Sister   . Anemia Sister   . Asthma Unknown   . Hypothyroidism Unknown   . Heart disease Paternal Grandfather        >55  . Stroke Maternal Uncle        > 55  . Deep vein thrombosis Sister   . Early death Neg Hx   . Hyperlipidemia Neg Hx   . Kidney disease Neg Hx   . Colon polyps Neg Hx   . Esophageal cancer Neg Hx   . Stomach cancer Neg Hx     Social History Social History   Tobacco Use  . Smoking status: Former Smoker    Types: Cigarettes    Last attempt to quit: 08/05/1999    Years since quitting: 18.8  . Smokeless tobacco: Never Used  Substance Use Topics  . Alcohol use: Yes    Comment: socially  . Drug use: No     Allergies   Naproxen   Review of Systems Review of Systems  Constitutional: Negative for fever.  Respiratory: Negative for cough and shortness of breath.   Cardiovascular: Negative for chest pain.  Gastrointestinal: Positive for abdominal pain. Negative for constipation, nausea and vomiting.    Musculoskeletal: Negative.   Skin: Negative.   Neurological: Negative.  Negative for syncope and weakness.     Physical Exam Updated Vital Signs BP (!) 184/125 (BP Location: Right Arm)   Pulse 82   Temp 98.4 F (36.9 C) (Oral)   Resp 16   LMP 05/14/2018   SpO2 99%   Physical  Exam  Constitutional: She is oriented to person, place, and time. She appears well-developed and well-nourished.  Uncomfortable appearing.  HENT:  Head: Normocephalic.  Neck: Normal range of motion. Neck supple.  Cardiovascular: Normal rate and regular rhythm.  No murmur heard. Pulmonary/Chest: Effort normal and breath sounds normal. She has no wheezes. She has no rhonchi. She has no rales.  Abdominal: Soft. Bowel sounds are normal. There is tenderness in the left upper quadrant. There is no rebound and no guarding.    Obese abdomen without tenseness or apparent distention.  Musculoskeletal: Normal range of motion.  Neurological: She is alert and oriented to person, place, and time.  Skin: Skin is warm and dry. No rash noted.  Psychiatric: She has a normal mood and affect.  Nursing note and vitals reviewed.    ED Treatments / Results  Labs (all labs ordered are listed, but only abnormal results are displayed) Labs Reviewed  COMPREHENSIVE METABOLIC PANEL - Abnormal; Notable for the following components:      Result Value   Potassium 3.4 (*)    Glucose, Bld 122 (*)    AST 49 (*)    ALT 66 (*)    All other components within normal limits  CBC - Abnormal; Notable for the following components:   RBC 6.40 (*)    HCT 46.6 (*)    MCV 72.8 (*)    MCH 22.7 (*)    All other components within normal limits  LIPASE, BLOOD  URINALYSIS, ROUTINE W REFLEX MICROSCOPIC  I-STAT BETA HCG BLOOD, ED ([facility], [facility], [facility] ONLY)   Results for orders placed or performed during the hospital encounter of 05/20/18  Lipase, blood  Result Value Ref Range   Lipase 36 11 - 51 U/L  Comprehensive metabolic panel  Result Value  Ref Range   Sodium 138 135 - 145 mmol/L   Potassium 3.4 (L) 3.5 - 5.1 mmol/L   Chloride 101 98 - 111 mmol/L   CO2 26 22 - 32 mmol/L   Glucose, Bld 122 (H) 70 - 99 mg/dL   BUN 7 6 - 20 mg/dL   Creatinine, Ser 0.99 0.44 - 1.00 mg/dL   Calcium 9.8 8.9 - 10.3 mg/dL   Total Protein 6.9 6.5 - 8.1 g/dL   Albumin 3.7 3.5 - 5.0 g/dL   AST 49 (H) 15 - 41 U/L   ALT 66 (H) 0 - 44 U/L   Alkaline Phosphatase 65 38 - 126 U/L   Total Bilirubin 0.4 0.3 - 1.2 mg/dL   GFR calc non Af Amer >60 >60 mL/min   GFR calc Af Amer >60 >60 mL/min   Anion gap 11 5 - 15  CBC  Result Value Ref Range   WBC 6.0 4.0 - 10.5 K/uL   RBC 6.40 (H) 3.87 - 5.11 MIL/uL   Hemoglobin 14.5 12.0 - 15.0 g/dL   HCT 46.6 (H) 36.0 - 46.0 %   MCV 72.8 (L) 80.0 - 100.0 fL   MCH 22.7 (L) 26.0 - 34.0 pg   MCHC 31.1 30.0 - 36.0 g/dL   RDW 14.1 11.5 - 15.5 %   Platelets 287 150 - 400 K/uL   nRBC 0.0 0.0 - 0.2 %  Urinalysis, Routine w reflex microscopic  Result Value Ref Range   Color, Urine YELLOW YELLOW   APPearance HAZY (A) CLEAR   Specific Gravity, Urine 1.017 1.005 - 1.030   pH 6.0 5.0 - 8.0   Glucose, UA NEGATIVE NEGATIVE mg/dL   Hgb urine dipstick  LARGE (A) NEGATIVE   Bilirubin Urine NEGATIVE NEGATIVE   Ketones, ur NEGATIVE NEGATIVE mg/dL   Protein, ur 100 (A) NEGATIVE mg/dL   Nitrite NEGATIVE NEGATIVE   Leukocytes, UA NEGATIVE NEGATIVE   RBC / HPF >50 (H) 0 - 5 RBC/hpf   WBC, UA 21-50 0 - 5 WBC/hpf   Bacteria, UA FEW (A) NONE SEEN   Squamous Epithelial / LPF 0-5 0 - 5   Mucus PRESENT   I-Stat beta hCG blood, ED  Result Value Ref Range   I-stat hCG, quantitative <5.0 <5 mIU/mL   Comment 3            EKG None  Radiology No results found. Dg Chest 2 View  Result Date: 05/17/2018 CLINICAL DATA:  Cough, headache and elevated blood pressure. EXAM: CHEST - 2 VIEW COMPARISON:  PA and lateral chest 04/26/2015 and 02/02/2018. FINDINGS: The lungs are clear. Heart size is normal. There is no pneumothorax or pleural  fluid. No acute or focal bony abnormality. IMPRESSION: Negative chest. Electronically Signed   By: Inge Rise M.D.   On: 05/17/2018 12:13   Dg Abdomen Acute W/chest  Result Date: 05/20/2018 CLINICAL DATA:  52 y/o F; left lower quadrant abdominal pain for a couple of days. Shortness of breath and cough this week. EXAM: DG ABDOMEN ACUTE W/ 1V CHEST COMPARISON:  05/17/2018 chest radiograph. 05/13/2018 CT abdomen and pelvis. FINDINGS: There is no evidence of dilated bowel loops or free intraperitoneal air. Kidneys largely obscured by bowel gas. 6 mm calculus projecting over the left renal pelvis noted on the lower abdominal supine image. Heart size and mediastinal contours are within normal limits. Both lungs are clear. Mild dextrocurvature of the lumbar spine. IMPRESSION: Normal bowel gas pattern. No acute cardiopulmonary disease. Kidneys largely obscured by bowel gas. 6 mm calculus projecting over left renal pelvis as seen on prior CT. Electronically Signed   By: Kristine Garbe M.D.   On: 05/20/2018 05:48   Dg Foot 2 Views Left  Result Date: 04/28/2018 Please see detailed radiograph report in office note.  Dg Foot 2 Views Right  Result Date: 04/28/2018 Please see detailed radiograph report in office note.  Dg Foot Complete Left  Result Date: 05/14/2018 Please see detailed radiograph report in office note.  Mm Digital Screening Bilateral  Result Date: 05/13/2018 CLINICAL DATA:  Screening. EXAM: DIGITAL SCREENING BILATERAL MAMMOGRAM WITH CAD COMPARISON:  Previous exam(s). ACR Breast Density Category b: There are scattered areas of fibroglandular density. FINDINGS: There are no findings suspicious for malignancy. Images were processed with CAD. IMPRESSION: No mammographic evidence of malignancy. A result letter of this screening mammogram will be mailed directly to the patient. RECOMMENDATION: Screening mammogram in one year. (Code:SM-B-01Y) BI-RADS CATEGORY  1: Negative.  Electronically Signed   By: Claudie Revering M.D.   On: 05/13/2018 13:32    Procedures Procedures (including critical care time)  Medications Ordered in ED Medications  metoCLOPramide (REGLAN) injection 10 mg (has no administration in time range)  diphenhydrAMINE (BENADRYL) injection 12.5 mg (has no administration in time range)     Initial Impression / Assessment and Plan / ED Course  I have reviewed the triage vital signs and the nursing notes.  Pertinent labs & imaging results that were available during my care of the patient were reviewed by me and considered in my medical decision making (see chart for details).     Patient to ED with LUQ abdominal pain for the past 2 days. No fever, N, V.  Enema taken at home without change in pain. She reports pain is getting worse over time.   Labs are unremarkable. Minimal elevation in AST/ALT. No RUQ tenderness to light or deep palpation. Medications in ED have resolved her pain. Abdominal imaging shows a normal BGP, no evidence of obstruction.   She is felt appropriate for discharge home. Will Rx Bentyl. Recommend follow up with PCP this week for recheck.   Final Clinical Impressions(s) / ED Diagnoses   Final diagnoses:  None   1. Abdominal pain  ED Discharge Orders    None       Charlann Lange, Hershal Coria 38/68/54 [id number]    Delora Fuel, MD 14/15/97 [phone]

## 2018-06-01 ENCOUNTER — Encounter (HOSPITAL_COMMUNITY): Payer: Self-pay | Admitting: *Deleted

## 2018-06-02 ENCOUNTER — Ambulatory Visit: Payer: Self-pay | Admitting: General Surgery

## 2018-06-02 ENCOUNTER — Other Ambulatory Visit: Payer: Self-pay | Admitting: General Surgery

## 2018-06-02 DIAGNOSIS — M545 Low back pain: Secondary | ICD-10-CM | POA: Diagnosis not present

## 2018-06-02 DIAGNOSIS — E669 Obesity, unspecified: Secondary | ICD-10-CM | POA: Diagnosis not present

## 2018-06-02 DIAGNOSIS — G8929 Other chronic pain: Secondary | ICD-10-CM | POA: Diagnosis not present

## 2018-06-02 DIAGNOSIS — R7303 Prediabetes: Secondary | ICD-10-CM | POA: Diagnosis not present

## 2018-06-02 NOTE — H&P (Signed)
Mary Sanford Documented: 06/02/2018 12:09 PM Location: [facility] Patient #: [id number] DOB: 12/19/1965 Married / Language: English / Race: Black or African American Female   History of Present Illness (Arinze Rivadeneira M. Sigurd Pugh MD; 06/02/2018 12:24 PM) The patient is a 52 year old female who presents for a bariatric surgery evaluation. I last saw her in May for long-term follow-up. We delayed her surgery at that time point because she was recovering from bunionectomy surgery. She is fully recovered. She denies any medical changes since I saw her in May. However she has had 2 trips the emergency room. One 4 a headache and some mild cough. She had not taken her blood pressure medicine the day. There is no signs of systemic illness and she was discharged from the ER. She most recently went to the ER on the 17th for left upper quadrant pain. She states that it was just bad gas. She denies any similar symptoms. She denies any nausea, vomiting, diarrhea or constipation. She denies any heartburn or reflux. However her upper GI did show some reflux. She states that she is taking her blood pressure medicines as prescribed. Her blood work in the ER on the 17th was unremarkable except for some mildly elevated AST and ALT. She did not have epigastric or right upper quadrant pain. Otherwise she states that she is ready for gastric bypass surgery. She denies any chest pain, chest pressure, shortness of breath. She denies any peripheral edema.  12/02/2017 She comes in for long-term follow-up regarding her obesity. I initially met her back in the fall of 2018 to discuss weight loss surgery. She continues to discuss her weight and be managed by her primary care doctor for her chronic obesity. She recently underwent a bunionectomy on her right foot. She also had a steroid injection in her left wrist for chronic pain. She had a colonoscopy in December which showed some benign polyps which were  removed. She had a normal basic metabolic panel in April. Her hemoglobin A1c was slightly increased to 6.4. Her lipid panel was normal in August 2018 except for a mildly low HDL level of 38. Her LFTs were normal. Other than that she denies any medical changes. She denies any chest pain, chest pressure, shows of breath, orthopnea. She denies any reflux.  She has completed supervised weight loss   04/16/2017 She is referred by Dr Thomas Jones for evaluation of weight loss surgery. She completed our seminar on line. She is leaning more toward a gastric bypass. She is not interested in having anything removed. She has struggled with her weight for many years. She has tried low-calorie diets, contrave, and phentermine-all without any long-term success.  Her comorbidities include hypertension, prediabetes, and central low back pain as well as bilateral hand osteoarthritis She denies any chest pain, chest pressure, shortness of breath, orthopnea, paroxysmal nocturnal dyspnea, TIAs or amaurosis fugax. She denies any personal family history of blood clots. She does occasionally have bilateral ankle edema. She denies any heartburn or reflux or indigestion. She has a bowel movement every other day. She denies any melena or hematochezia. She denies any abdominal pain. She is had a tubal ligation and a C-section. She has had kidney stones in the past but denies any dysuria or hematuria. She has central low back pain for which she has received steroid injections. She has bilateral wrist pain but not numbness. She also has some left shoulder pain. She denies any frequent issues with headaches or vision changes. She   is a former smoker. She may drink 1 glass of wine per week. She denies any drug use.   Problem List/Past Medical (Hiroyuki Ozanich M. Sheina Mcleish, MD; 06/02/2018 12:27 PM) PREDIABETES (R73.03)  REFLUX ESOPHAGITIS (K21.0)  BILATERAL WRIST PAIN (M25.531, M25.532)  OBESITY (BMI 30-39.9) (E66.9)    Past Surgical History (Willistine Ferrall M. Joelie Schou, MD; 06/02/2018 12:27 PM) Cesarean Section - 1  Hemorrhoidectomy   Diagnostic Studies History (Ruey Storer M. Angelise Petrich, MD; 06/02/2018 12:27 PM) Colonoscopy  never Mammogram  1-3 years ago Pap Smear  1-5 years ago  Allergies (Tanisha A. Brown, RMA; 06/02/2018 12:10 PM) Naproxen Sodium  panic attack Allergies Reconciled   Medication History (Trayce Caravello M. Frayda Egley, MD; 06/02/2018 12:27 PM) Phentermine HCl (37.5MG Tablet, Oral) Active. HydroCHLOROthiazide (25MG Tablet, Oral) Active. Potassium Chloride Crys ER (10MEQ Tablet ER, Oral) Active. Escitalopram Oxalate (10MG Tablet, Oral) Active.  Social History (Chimaobi Casebolt M. Nyriah Coote, MD; 06/02/2018 12:27 PM) Alcohol use  Occasional alcohol use. Caffeine use  Carbonated beverages. Illicit drug use  Remotely quit drug use. Tobacco use  Former smoker.  Family History (Jakhia Buxton M. Dezirea Mccollister, MD; 06/02/2018 12:27 PM) Alcohol Abuse  Family Members In General. Arthritis  Mother. Colon Cancer  Mother. Depression  Daughter. Diabetes Mellitus  Daughter, Mother. Heart disease in female family member before age 65  Hypertension  Daughter, Family Members In General, Mother, Sister. Rectal Cancer  Mother. Seizure disorder  Daughter. Thyroid problems  Mother, Sister.  Pregnancy / Birth History (Caniya Tagle M. Franz Svec, MD; 06/02/2018 12:27 PM) Age at menarche  14 years. Contraceptive History  Oral contraceptives. Gravida  7 Length (months) of breastfeeding  7-12 Maternal age  15-20 Para  5 Regular periods   Other Problems (Francely Craw M. Anaija Wissink, MD; 06/02/2018 12:27 PM) Anxiety Disorder  Arthritis  Depression  Hemorrhoids  Kidney Stone  HYPERTENSION, ESSENTIAL (I10)  CHRONIC MIDLINE LOW BACK PAIN WITHOUT SCIATICA (M54.5)     Review of Systems (Sarenity Ramaker M. Emory Leaver MD; 06/02/2018 12:24 PM) General Present- Night Sweats and Weight Gain. Not Present- Appetite Loss, Chills, Fatigue, Fever and Weight  Loss. Skin Not Present- Change in Wart/Mole, Dryness, Hives, Jaundice, New Lesions, Non-Healing Wounds, Rash and Ulcer. HEENT Present- Ringing in the Ears, Seasonal Allergies and Wears glasses/contact lenses. Not Present- Earache, Hearing Loss, Hoarseness, Nose Bleed, Oral Ulcers, Sinus Pain, Sore Throat, Visual Disturbances and Yellow Eyes. Breast Not Present- Breast Mass, Breast Pain, Nipple Discharge and Skin Changes. Cardiovascular Not Present- Chest Pain, Difficulty Breathing Lying Down, Leg Cramps, Palpitations, Rapid Heart Rate, Shortness of Breath and Swelling of Extremities. Gastrointestinal Not Present- Abdominal Pain, Bloating, Bloody Stool, Change in Bowel Habits, Chronic diarrhea, Constipation, Difficulty Swallowing, Excessive gas, Gets full quickly at meals, Hemorrhoids, Indigestion, Nausea, Rectal Pain and Vomiting. Female Genitourinary Not Present- Frequency, Nocturia, Painful Urination, Pelvic Pain and Urgency. Musculoskeletal Present- Back Pain. Not Present- Joint Pain, Joint Stiffness, Muscle Pain, Muscle Weakness and Swelling of Extremities. Neurological Not Present- Decreased Memory, Fainting, Headaches, Numbness, Seizures, Tingling, Tremor, Trouble walking and Weakness. Psychiatric Present- Depression. Not Present- Anxiety, Bipolar, Change in Sleep Pattern, Fearful and Frequent crying. Endocrine Present- Hot flashes and New Diabetes. Not Present- Cold Intolerance, Excessive Hunger, Hair Changes and Heat Intolerance. Hematology Not Present- Blood Thinners, Easy Bruising, Excessive bleeding, Gland problems, HIV and Persistent Infections.  Vitals (Tanisha A. Brown RMA; 06/02/2018 12:10 PM) 06/02/2018 12:09 PM Weight: 256.6 lb Height: 68.75in Body Surface Area: 2.29 m Body Mass Index: 38.17 kg/m  Temp.: 98.4F  Pulse: 103 (Regular)  BP: 134/82 (Sitting, Left Arm, Standard)         Physical Exam (Urie Loughner M. Caidon Foti MD; 06/02/2018 12:26 PM) The physical exam findings  are as follows: Note:obese  General Mental Status-Alert. General Appearance-Consistent with stated age. Hydration-Well hydrated. Voice-Normal.  Head and Neck Head-normocephalic, atraumatic with no lesions or palpable masses. Trachea-midline. Thyroid Gland Characteristics - normal size and consistency.  Eye Eyeball - Bilateral-Extraocular movements intact. Sclera/Conjunctiva - Bilateral-No scleral icterus.  ENMT Ears -Note: normal external ears.  Mouth and Throat -Note: lips intact.   Chest and Lung Exam Chest and lung exam reveals -quiet, even and easy respiratory effort with no use of accessory muscles and on auscultation, normal breath sounds, no adventitious sounds and normal vocal resonance. Inspection Chest Wall - Normal. Back - normal.  Breast - Did not examine.  Cardiovascular Cardiovascular examination reveals -normal heart sounds, regular rate and rhythm with no murmurs and normal pedal pulses bilaterally.  Abdomen Inspection Inspection of the abdomen reveals - No Hernias. Skin - Scar - Note: lower midline. Palpation/Percussion Palpation and Percussion of the abdomen reveal - Soft, Non Tender, No Rebound tenderness, No Rigidity (guarding) and No hepatosplenomegaly. Auscultation Auscultation of the abdomen reveals - Bowel sounds normal.  Peripheral Vascular Upper Extremity Palpation - Pulses bilaterally normal.  Neurologic Neurologic evaluation reveals -alert and oriented x 3 with no impairment of recent or remote memory. Mental Status-Normal.  Neuropsychiatric The patient's mood and affect are described as -normal. Judgment and Insight-insight is appropriate concerning matters relevant to self.  Musculoskeletal Normal Exam - Left-Upper Extremity Strength Normal and Lower Extremity Strength Normal. Normal Exam - Right-Upper Extremity Strength Normal and Lower Extremity Strength Normal. Note: Hard boot on right  foot   Lymphatic Head & Neck  General Head & Neck Lymphatics: Bilateral - Description - Normal. Axillary - Did not examine. Femoral & Inguinal - Did not examine.    Assessment & Plan (Harry Shuck M. Laylana Gerwig MD; 06/02/2018 12:27 PM) OBESITY (BMI 30-39.9) (E66.9) Impression: The patient meets weight loss surgery criteria. I think the patient would be an acceptable candidate for Laparoscopic Roux-en-Y Gastric bypass.  We rediscussed laparoscopic Roux-en-Y gastric bypass. We discussed the preoperative, operative and postoperative process. At her last appointment we had an extensive discussion regarding risks, benefits and long-term outcomes and she did not feel she needed to rediscussed that today. Current Plans Pt Education - EMW_preopbariatric PREDIABETES (R73.03) CHRONIC MIDLINE LOW BACK PAIN WITHOUT SCIATICA (M54.5) HYPERTENSION, ESSENTIAL (I10)  Christpher Stogsdill M. Steffen Hase, MD, FACS General, Bariatric, & Minimally Invasive Surgery [facility]   

## 2018-06-02 NOTE — H&P (View-Only) (Signed)
Mary Sanford Documented: 06/02/2018 12:09 PM Location: [facility] Patient #: [id number] DOB: 09-Jun-1966 Married / Language: English / Race: Black or African American Female   History of Present Illness Randall Hiss M. Square Jowett MD; 06/02/2018 12:24 PM) The patient is a 52 year old female who presents for a bariatric surgery evaluation. I last saw her in May for long-term follow-up. We delayed her surgery at that time point because she was recovering from bunionectomy surgery. She is fully recovered. She denies any medical changes since I saw her in May. However she has had 2 trips the emergency room. One 4 a headache and some mild cough. She had not taken her blood pressure medicine the day. There is no signs of systemic illness and she was discharged from the ER. She most recently went to the ER on the 17th for left upper quadrant pain. She states that it was just bad gas. She denies any similar symptoms. She denies any nausea, vomiting, diarrhea or constipation. She denies any heartburn or reflux. However her upper GI did show some reflux. She states that she is taking her blood pressure medicines as prescribed. Her blood work in the ER on the 17th was unremarkable except for some mildly elevated AST and ALT. She did not have epigastric or right upper quadrant pain. Otherwise she states that she is ready for gastric bypass surgery. She denies any chest pain, chest pressure, shortness of breath. She denies any peripheral edema.  12/02/2017 She comes in for long-term follow-up regarding her obesity. I initially met her back in the fall of 2018 to discuss weight loss surgery. She continues to discuss her weight and be managed by her primary care doctor for her chronic obesity. She recently underwent a bunionectomy on her right foot. She also had a steroid injection in her left wrist for chronic pain. She had a colonoscopy in December which showed some benign polyps which were  removed. She had a normal basic metabolic panel in April. Her hemoglobin A1c was slightly increased to 6.4. Her lipid panel was normal in August 2018 except for a mildly low HDL level of 38. Her LFTs were normal. Other than that she denies any medical changes. She denies any chest pain, chest pressure, shows of breath, orthopnea. She denies any reflux.  She has completed supervised weight loss   04/16/2017 She is referred by Dr Scarlette Calico for evaluation of weight loss surgery. She completed our seminar on line. She is leaning more toward a gastric bypass. She is not interested in having anything removed. She has struggled with her weight for many years. She has tried low-calorie diets, contrave, and phentermine-all without any long-term success.  Her comorbidities include hypertension, prediabetes, and central low back pain as well as bilateral hand osteoarthritis She denies any chest pain, chest pressure, shortness of breath, orthopnea, paroxysmal nocturnal dyspnea, TIAs or amaurosis fugax. She denies any personal family history of blood clots. She does occasionally have bilateral ankle edema. She denies any heartburn or reflux or indigestion. She has a bowel movement every other day. She denies any melena or hematochezia. She denies any abdominal pain. She is had a tubal ligation and a C-section. She has had kidney stones in the past but denies any dysuria or hematuria. She has central low back pain for which she has received steroid injections. She has bilateral wrist pain but not numbness. She also has some left shoulder pain. She denies any frequent issues with headaches or vision changes. She  is a former smoker. She may drink 1 glass of wine per week. She denies any drug use.   Problem List/Past Medical Randall Hiss M. Redmond Pulling, MD; 06/02/2018 12:27 PM) PREDIABETES (R73.03)  REFLUX ESOPHAGITIS (K21.0)  BILATERAL WRIST PAIN (M25.531, M25.532)  OBESITY (BMI 30-39.9) (E66.9)    Past Surgical History Randall Hiss M. Redmond Pulling, MD; 06/02/2018 12:27 PM) Cesarean Section - 1  Hemorrhoidectomy   Diagnostic Studies History Randall Hiss M. Redmond Pulling, MD; 06/02/2018 12:27 PM) Colonoscopy  never Mammogram  1-3 years ago Pap Smear  1-5 years ago  Allergies (Tanisha A. Owens Shark, [place]; 06/02/2018 12:10 PM) Naproxen Sodium  panic attack Allergies Reconciled   Medication History Randall Hiss M. Redmond Pulling, MD; 06/02/2018 12:27 PM) Phentermine HCl (37.5MG Tablet, Oral) Active. HydroCHLOROthiazide (25MG Tablet, Oral) Active. Potassium Chloride Crys ER (10MEQ Tablet ER, Oral) Active. Escitalopram Oxalate (10MG Tablet, Oral) Active.  Social History Randall Hiss M. Redmond Pulling, MD; 06/02/2018 12:27 PM) Alcohol use  Occasional alcohol use. Caffeine use  Carbonated beverages. Illicit drug use  Remotely quit drug use. Tobacco use  Former smoker.  Family History Randall Hiss M. Redmond Pulling, MD; 06/02/2018 12:27 PM) Alcohol Abuse  Family Members In General. Arthritis  Mother. Colon Cancer  Mother. Depression  Daughter. Diabetes Mellitus  Daughter, Mother. Heart disease in female family member before age 61  Hypertension  Daughter, Family Members In General, Mother, Sister. Rectal Cancer  Mother. Seizure disorder  Daughter. Thyroid problems  Mother, Sister.  Pregnancy / Birth History Randall Hiss M. Redmond Pulling, MD; 06/02/2018 12:27 PM) Age at menarche  25 years. Contraceptive History  Oral contraceptives. Gravida  7 Length (months) of breastfeeding  7-12 Maternal age  64-20 Para  5 Regular periods   Other Problems Randall Hiss M. Redmond Pulling, MD; 06/02/2018 12:27 PM) Anxiety Disorder  Arthritis  Depression  Hemorrhoids  Kidney Stone  HYPERTENSION, ESSENTIAL (I10)  CHRONIC MIDLINE LOW BACK PAIN WITHOUT SCIATICA (M54.5)     Review of Systems Randall Hiss M. Sarahy Creedon MD; 06/02/2018 12:24 PM) General Present- Night Sweats and Weight Gain. Not Present- Appetite Loss, Chills, Fatigue, Fever and Weight  Loss. Skin Not Present- Change in Wart/Mole, Dryness, Hives, Jaundice, New Lesions, Non-Healing Wounds, Rash and Ulcer. HEENT Present- Ringing in the Ears, Seasonal Allergies and Wears glasses/contact lenses. Not Present- Earache, Hearing Loss, Hoarseness, Nose Bleed, Oral Ulcers, Sinus Pain, Sore Throat, Visual Disturbances and Yellow Eyes. Breast Not Present- Breast Mass, Breast Pain, Nipple Discharge and Skin Changes. Cardiovascular Not Present- Chest Pain, Difficulty Breathing Lying Down, Leg Cramps, Palpitations, Rapid Heart Rate, Shortness of Breath and Swelling of Extremities. Gastrointestinal Not Present- Abdominal Pain, Bloating, Bloody Stool, Change in Bowel Habits, Chronic diarrhea, Constipation, Difficulty Swallowing, Excessive gas, Gets full quickly at meals, Hemorrhoids, Indigestion, Nausea, Rectal Pain and Vomiting. Female Genitourinary Not Present- Frequency, Nocturia, Painful Urination, Pelvic Pain and Urgency. Musculoskeletal Present- Back Pain. Not Present- Joint Pain, Joint Stiffness, Muscle Pain, Muscle Weakness and Swelling of Extremities. Neurological Not Present- Decreased Memory, Fainting, Headaches, Numbness, Seizures, Tingling, Tremor, Trouble walking and Weakness. Psychiatric Present- Depression. Not Present- Anxiety, Bipolar, Change in Sleep Pattern, Fearful and Frequent crying. Endocrine Present- Hot flashes and New Diabetes. Not Present- Cold Intolerance, Excessive Hunger, Hair Changes and Heat Intolerance. Hematology Not Present- Blood Thinners, Easy Bruising, Excessive bleeding, Gland problems, HIV and Persistent Infections.  Vitals (Tanisha A. Brown RMA; 06/02/2018 12:10 PM) 06/02/2018 12:09 PM Weight: 256.6 lb Height: 68.75in Body Surface Area: 2.29 m Body Mass Index: 38.17 kg/m  Temp.: 98.33F  Pulse: 103 (Regular)  BP: 134/82 (Sitting, Left Arm, Standard)  Physical Exam Randall Hiss M. Faythe Heitzenrater MD; 06/02/2018 12:26 PM) The physical exam findings  are as follows: Note:obese  General Mental Status-Alert. General Appearance-Consistent with stated age. Hydration-Well hydrated. Voice-Normal.  Head and Neck Head-normocephalic, atraumatic with no lesions or palpable masses. Trachea-midline. Thyroid Gland Characteristics - normal size and consistency.  Eye Eyeball - Bilateral-Extraocular movements intact. Sclera/Conjunctiva - Bilateral-No scleral icterus.  ENMT Ears -Note: normal external ears.  Mouth and Throat -Note: lips intact.   Chest and Lung Exam Chest and lung exam reveals -quiet, even and easy respiratory effort with no use of accessory muscles and on auscultation, normal breath sounds, no adventitious sounds and normal vocal resonance. Inspection Chest Wall - Normal. Back - normal.  Breast - Did not examine.  Cardiovascular Cardiovascular examination reveals -normal heart sounds, regular rate and rhythm with no murmurs and normal pedal pulses bilaterally.  Abdomen Inspection Inspection of the abdomen reveals - No Hernias. Skin - Scar - Note: lower midline. Palpation/Percussion Palpation and Percussion of the abdomen reveal - Soft, Non Tender, No Rebound tenderness, No Rigidity (guarding) and No hepatosplenomegaly. Auscultation Auscultation of the abdomen reveals - Bowel sounds normal.  Peripheral Vascular Upper Extremity Palpation - Pulses bilaterally normal.  Neurologic Neurologic evaluation reveals -alert and oriented x 3 with no impairment of recent or remote memory. Mental Status-Normal.  Neuropsychiatric The patient's mood and affect are described as -normal. Judgment and Insight-insight is appropriate concerning matters relevant to self.  Musculoskeletal Normal Exam - Left-Upper Extremity Strength Normal and Lower Extremity Strength Normal. Normal Exam - Right-Upper Extremity Strength Normal and Lower Extremity Strength Normal. Note: Hard boot on right  foot   Lymphatic Head & Neck  General Head & Neck Lymphatics: Bilateral - Description - Normal. Axillary - Did not examine. Femoral & Inguinal - Did not examine.    Assessment & Plan Randall Hiss M. Jabier Deese MD; 06/02/2018 12:27 PM) OBESITY (BMI 30-39.9) (E66.9) Impression: The patient meets weight loss surgery criteria. I think the patient would be an acceptable candidate for Laparoscopic Roux-en-Y Gastric bypass.  We rediscussed laparoscopic Roux-en-Y gastric bypass. We discussed the preoperative, operative and postoperative process. At her last appointment we had an extensive discussion regarding risks, benefits and long-term outcomes and she did not feel she needed to rediscussed that today. Current Plans Pt Education - EMW_preopbariatric PREDIABETES (R73.03) CHRONIC MIDLINE LOW BACK PAIN WITHOUT SCIATICA (M54.5) HYPERTENSION, ESSENTIAL (I10)  Leighton Ruff. Redmond Pulling, MD, FACS General, Bariatric, & Minimally Invasive Surgery [facility], [place]

## 2018-06-07 ENCOUNTER — Ambulatory Visit (HOSPITAL_COMMUNITY)
Admission: EM | Admit: 2018-06-07 | Discharge: 2018-06-07 | Disposition: A | Discharge status: Home / Self Care | Payer: Federal, State, Local not specified - PPO | Attending: Family Medicine | Admitting: Family Medicine

## 2018-06-07 ENCOUNTER — Other Ambulatory Visit: Payer: Self-pay | Admitting: Internal Medicine

## 2018-06-07 ENCOUNTER — Other Ambulatory Visit: Payer: Self-pay

## 2018-06-07 ENCOUNTER — Encounter (HOSPITAL_COMMUNITY): Payer: Self-pay | Admitting: Emergency Medicine

## 2018-06-07 DIAGNOSIS — I10 Essential (primary) hypertension: Secondary | ICD-10-CM

## 2018-06-07 NOTE — ED Triage Notes (Addendum)
Pt states she was at the pharmacy today and she checked her BP.  It was 201/118.  On recheck it was 199/125.  Pt had not had her HCTZ for several days, but had it refilled at the pharmacy.  Pt took her HCTZ in the lobby at 1745.

## 2018-06-07 NOTE — Discharge Instructions (Signed)
Take the HCTZ and the Bystolic every morning Keep track of your readings daily Follow up with your PCP

## 2018-06-07 NOTE — ED Provider Notes (Signed)
[facility]    CSN: [id number] Arrival date & time: 06/07/18  1739     History   Chief Complaint Chief Complaint  Patient presents with  . Hypertension    HPI Mary Sanford is a 52 y.o. female.   HPI  Patient is here for untreated hypertension.  She admits that she did not take either of her blood pressure medicines for 3 days.  She states that she was given Bystolic at her last visit with her primary care doctor.  She never took it.  She did not think she needed it.  She is taking hydrochlorothiazide daily.  She ran out 3 days ago.  When she went to pick it up today she felt like her blood pressure might be high.  Vaguely lightheaded.  She had a blood pressure taken at the pharmacy and it was extremely high.  The pharmacist made her sit down, take her hydrochlorothiazide, and sent her here for evaluation.  No visual symptoms.  No nausea. Patient is concerned because she is scheduled to have surgery this month and does not want it delayed because of her hypertension.  I stressed to her the importance of compliance in order to prevent hypertension complications.  She currently feels well  Past Medical History:  Diagnosis Date  . Allergy   . Anemia   . Anxiety   . Arthritis    arm/ wrist areas  . B12 deficiency   . Constipation    not chronic- now stools soft and regular   . Cough    non- productive  . Depression    no meds  . Hypertension   . Kidney stones     Patient Active Problem List   Diagnosis Date Noted  . RTI (respiratory tract infection) 02/10/2018  . ASCUS pap with negative high risk HPV on 06/30/16 06/30/2016  . Depression with anxiety 12/28/2015  . De Quervain's tenosynovitis, left 06/21/2015  . Prediabetes 11/02/2014  . Anemia, iron deficiency 09/17/2013  . Routine general medical examination at a health care facility 01/12/2013  . Allergic rhinitis 01/12/2013  . Constipation, chronic 10/04/2012  . Visit for screening mammogram 01/12/2012    . B12 deficiency anemia 01/09/2012  . Obesity (BMI 30-39.9) 01/09/2012  . Essential hypertension, benign 11/24/2011    Past Surgical History:  Procedure Laterality Date  . BUNIONECTOMY Bilateral   . caesarean section    . cystoscopic     extraction of ureteric calculus without disintegration  . DILITATION & CURRETTAGE/HYSTROSCOPY WITH NOVASURE ABLATION N/A 06/17/2013   Procedure: DILATATION & CURETTAGE/HYSTEROSCOPY WITH NOVASURE ABLATION;  Surgeon: Osborne Oman, MD;  Location: [facility];  Service: Gynecology;  Laterality: N/A;  . HEMORRHOIDECTOMY WITH HEMORRHOID BANDING  10/19/2012   SCA THD Hem ligation/pexy  . TUBAL LIGATION    . WISDOM TOOTH EXTRACTION      OB History    Gravida  7   Para  5   Term  4   Preterm  1   AB  2   Living  5     SAB  0   TAB  2   Ectopic  0   Multiple  0   Live Births  5            Home Medications    Prior to Admission medications   Medication Sig Start Date End Date Taking? Authorizing Provider  escitalopram (LEXAPRO) 10 MG tablet Take 1 tablet (10 mg total) by mouth daily. 02/10/18  Yes  [name], MD  hydrochlorothiazide (HYDRODIURIL) 25 MG tablet TAKE 1 TABLET BY MOUTH ONCE DAILY 06/07/18  Yes [name], MD  phentermine (ADIPEX-P) 37.5 MG tablet Take 1 tablet (37.5 mg total) by mouth daily before breakfast. 02/10/18  Yes [name], MD  potassium chloride (K-DUR,KLOR-CON) 10 MEQ tablet Take 1 tablet (10 mEq total) by mouth 2 (two) times daily. Patient taking differently: Take 10 mEq by mouth daily.  07/23/17  Yes [name], MD  acetaminophen (TYLENOL) 500 MG tablet Take 1,000 mg by mouth every 6 (six) hours as needed for mild pain.     [provider]  albuterol (PROVENTIL HFA;VENTOLIN HFA) 108 (90 Base) MCG/ACT inhaler Inhale 1-2 puffs into the lungs every 6 (six) hours as needed for wheezing or shortness of breath. 10/24/16   Nche, Charlene Brooke, NP  benzonatate (TESSALON) 100 MG capsule Take  1 capsule (100 mg total) by mouth 3 (three) times daily as needed for cough. 05/17/18   Julianne Rice, MD  dicyclomine (BENTYL) 20 MG tablet Take 1 tablet (20 mg total) by mouth 2 (two) times daily. 05/20/18   Charlann Lange, PA-C  fluticasone (FLONASE) 50 MCG/ACT nasal spray Place 2 sprays into both nostrils daily. 05/17/18   Julianne Rice, MD  metoCLOPramide (REGLAN) 10 MG tablet Take 1 tablet (10 mg total) by mouth every 6 (six) hours as needed for nausea. 05/17/18   Julianne Rice, MD  nebivolol (BYSTOLIC) 5 MG tablet Take 1 tablet (5 mg total) by mouth daily. Patient not taking: Reported on 05/17/2018 02/10/18   [name], MD  potassium chloride (K-DUR) 10 MEQ tablet Take 1 tablet (10 mEq total) by mouth 2 (two) times daily. 05/13/12 06/27/13  [name], MD    Family History Family History  Problem Relation Age of Onset  . Hypertension Mother   . Hypothyroidism Mother   . Asthma Mother   . Cancer Mother 71       Colon and rectal  . Rectal cancer Mother 52  . Colon cancer Mother 70  . Hypotension Sister   . Hypothyroidism Sister   . Diabetes Sister   . Seizures Brother   . Hypothyroidism Sister   . Seizures Sister   . Anemia Sister   . Asthma Unknown   . Hypothyroidism Unknown   . Heart disease Paternal Grandfather        >55  . Stroke Maternal Uncle        > 55  . Deep vein thrombosis Sister   . Early death Neg Hx   . Hyperlipidemia Neg Hx   . Kidney disease Neg Hx   . Colon polyps Neg Hx   . Esophageal cancer Neg Hx   . Stomach cancer Neg Hx     Social History Social History   Tobacco Use  . Smoking status: Former Smoker    Types: Cigarettes    Last attempt to quit: 08/05/1999    Years since quitting: 18.8  . Smokeless tobacco: Never Used  Substance Use Topics  . Alcohol use: Yes    Comment: socially  . Drug use: No     Allergies   Naproxen   Review of Systems Review of Systems  Constitutional: Negative for chills and fever.   HENT: Negative for ear pain and sore throat.   Eyes: Negative for pain and visual disturbance.  Respiratory: Negative for cough and shortness of breath.   Cardiovascular: Negative for chest pain and palpitations.  Gastrointestinal:  Negative for abdominal pain and vomiting.  Genitourinary: Negative for dysuria and hematuria.  Musculoskeletal: Negative for arthralgias and back pain.  Skin: Negative for color change and rash.  Neurological: Negative for seizures and syncope.  All other systems reviewed and are negative.    Physical Exam Triage Vital Signs ED Triage Vitals  Enc Vitals Group     BP 06/07/18 1823 (!) 183/102     Pulse Rate 06/07/18 1823 94     Resp --      Temp 06/07/18 1823 98.2 F (36.8 C)     Temp Source 06/07/18 1823 Oral     SpO2 06/07/18 1823 100 %     Weight --      Height --      Head Circumference --      Peak Flow --      Pain Score 06/07/18 1824 0     Pain Loc --      Pain Edu? --      Excl. in [place]? --    No data found.  Updated Vital Signs BP (!) 183/102 (BP Location: Left Arm)   Pulse 94   Temp 98.2 F (36.8 C) (Oral)   LMP 05/14/2018   SpO2 100%   Visual Acuity Right Eye Distance:   Left Eye Distance:   Bilateral Distance:    Right Eye Near:   Left Eye Near:    Bilateral Near:     Physical Exam  Constitutional: She appears well-developed and well-nourished. No distress.  HENT:  Head: Normocephalic and atraumatic.  Mouth/Throat: Oropharynx is clear and moist.  Eyes: Pupils are equal, round, and reactive to light. Conjunctivae are normal.  Disks are flat  Neck: Normal range of motion.  Cardiovascular: Normal rate, regular rhythm and normal heart sounds.  Pulmonary/Chest: Effort normal and breath sounds normal. No respiratory distress.  Abdominal: Soft. Bowel sounds are normal. She exhibits no distension. There is no tenderness.  Musculoskeletal: Normal range of motion. She exhibits no edema.  Neurological: She is alert.  Skin:  Skin is warm and dry.  Psychiatric: She has a normal mood and affect. Her behavior is normal.     UC Treatments / Results  Labs (all labs ordered are listed, but only abnormal results are displayed) Labs Reviewed - No data to display  EKG None  Radiology No results found.  Procedures Procedures (including critical care time)  Medications Ordered in UC Medications - No data to display  Initial Impression / Assessment and Plan / UC Course  I have reviewed the triage vital signs and the nursing notes.  Pertinent labs & imaging results that were available during my care of the patient were reviewed by me and considered in my medical decision making (see chart for details).     We spent some time discussing the importance of hypertension compliance.  I recommend that she take her hydrochlorothiazide again in the morning.  She also needs to take her Bystolic daily.  She needs to take her blood pressure every day and record it.  She needs to take her list of blood pressures to her primary care office within the next couple of weeks.  She may need to be on additional medication. Final Clinical Impressions(s) / UC Diagnoses   Final diagnoses:  Essential hypertension     Discharge Instructions     Take the HCTZ and the Bystolic every morning Keep track of your readings daily Follow up with your PCP    ED Prescriptions  None     Controlled Substance Prescriptions [place] Controlled Substance Registry consulted? Not Applicable   Raylene Everts, MD 06/07/18 2146

## 2018-06-11 NOTE — Patient Instructions (Addendum)
MARISAH LAKER  06/11/2018   Your procedure is scheduled on: 06-22-18     Report to [facility] Main  Entrance    Report to Admitting at 5:30 AM    Call this number if you have problems the morning of surgery [phone]     Remember: MORNING OF SURGERY DRINK:  [place], DRINK ALL OF THE SHAKE AT ONE TIME.   NO SOLID FOOD AFTER 600 PM THE NIGHT BEFORE YOUR SURGERY. YOU MAY DRINK CLEAR FLUIDS. THE SHAKE YOU DRINK BEFORE YOU LEAVE HOME WILL BE THE LAST FLUIDS YOU DRINK BEFORE SURGERY.  PAIN IS EXPECTED AFTER SURGERY AND WILL NOT BE COMPLETELY ELIMINATED. AMBULATION AND TYLENOL WILL HELP REDUCE INCISIONAL AND GAS PAIN. MOVEMENT IS KEY!  YOU ARE EXPECTED TO BE OUT OF BED WITHIN 4 HOURS OF ADMISSION TO YOUR PATIENT ROOM.  SITTING IN THE RECLINER THROUGHOUT THE DAY IS IMPORTANT FOR DRINKING FLUIDS AND MOVING GAS THROUGHOUT THE GI TRACT.  COMPRESSION STOCKINGS SHOULD BE WORN [place] UNLESS YOU ARE WALKING.   INCENTIVE SPIROMETER SHOULD BE USED EVERY HOUR WHILE AWAKE TO DECREASE POST-OPERATIVE COMPLICATIONS SUCH AS PNEUMONIA.  WHEN DISCHARGED HOME, IT IS IMPORTANT TO CONTINUE TO WALK EVERY HOUR AND USE THE INCENTIVE SPIROMETER EVERY HOUR.     CLEAR LIQUID DIET   Foods Allowed                                                                     Foods Excluded  Coffee and tea, regular and decaf                             liquids that you cannot  Plain Jell-O in any flavor                                             see through such as: Fruit ices (not with fruit pulp)                                     milk, soups, orange juice  Iced Popsicles                                    All solid food Carbonated beverages, regular and diet                                    Cranberry, grape and apple juices Sports drinks like Gatorade Lightly seasoned clear broth or consume(fat free) Sugar, honey syrup  Sample  Menu Breakfast                                Lunch  Supper Cranberry juice                    Beef broth                            Chicken broth Jell-O                                     Grape juice                           Apple juice Coffee or tea                        Jell-O                                      Popsicle                                                Coffee or tea                        Coffee or tea  _____________________________________________________________________      Take these medicines the morning of surgery with A SIP OF WATER: Escitalopram (Lexapro), Nebivolol (Bystolic),and Rabeprazole (Aciphex). You may also bring and use your nasal spray as needed.       BRUSH YOUR TEETH MORNING OF SURGERY AND RINSE YOUR MOUTH OUT, NO CHEWING GUM CANDY OR MINTS.                          You may not have any metal on your body including hair pins and              piercings  Do not wear jewelry, make-up, lotions, powders or perfumes, deodorant             Do not wear nail polish.  Do not shave  48 hours prior to surgery.                Do not bring valuables to the hospital. [place].  Contacts, dentures or bridgework may not be worn into surgery.  Leave suitcase in the car. After surgery it may be brought to your room.    Special Instructions: N/A              Please read over the following fact sheets you were given: _____________________________________________________________________          [facility] - Preparing for Surgery Before surgery, you can play an important role.  Because skin is not sterile, your skin needs to be as free of germs as possible.  You can reduce the number of germs on your skin by washing with CHG (chlorahexidine gluconate) soap before surgery.  CHG is an antiseptic cleaner which kills germs and bonds with the skin to continue killing germs even after  washing. Please DO NOT use if you have an allergy  to CHG or antibacterial soaps.  If your skin becomes reddened/irritated stop using the CHG and inform your nurse when you arrive at Short Stay. Do not shave (including legs and underarms) for at least 48 hours prior to the first CHG shower.  You may shave your face/neck. Please follow these instructions carefully:  1.  Shower with CHG Soap the night before surgery and the  morning of Surgery.  2.  If you choose to wash your hair, wash your hair first as usual with your  normal  shampoo.  3.  After you shampoo, rinse your hair and body thoroughly to remove the  shampoo.                           4.  Use CHG as you would any other liquid soap.  You can apply chg directly  to the skin and wash                       Gently with a scrungie or clean washcloth.  5.  Apply the CHG Soap to your body ONLY FROM THE NECK DOWN.   Do not use on face/ open                           Wound or open sores. Avoid contact with eyes, ears mouth and genitals (private parts).                       Wash face,  Genitals (private parts) with your normal soap.             6.  Wash thoroughly, paying special attention to the area where your surgery  will be performed.  7.  Thoroughly rinse your body with warm water from the neck down.  8.  DO NOT shower/wash with your normal soap after using and rinsing off  the CHG Soap.                9.  Pat yourself dry with a clean towel.            10.  Wear clean pajamas.            11.  Place clean sheets on your bed the night of your first shower and do not  sleep with pets. Day of Surgery : Do not apply any lotions/deodorants the morning of surgery.  Please wear clean clothes to the hospital/surgery center.  FAILURE TO FOLLOW THESE INSTRUCTIONS MAY RESULT IN THE CANCELLATION OF YOUR SURGERY PATIENT SIGNATURE_________________________________  NURSE  SIGNATURE__________________________________  ________________________________________________________________________  WHAT IS A BLOOD TRANSFUSION? Blood Transfusion Information  A transfusion is the replacement of blood or some of its parts. Blood is made up of multiple cells which provide different functions.  Red blood cells carry oxygen and are used for blood loss replacement.  White blood cells fight against infection.  Platelets control bleeding.  Plasma helps clot blood.  Other blood products are available for specialized needs, such as hemophilia or other clotting disorders. BEFORE THE TRANSFUSION  Who gives blood for transfusions?   Healthy volunteers who are fully evaluated to make sure their blood is safe. This is blood bank blood. Transfusion therapy is the safest it has ever been in the practice of medicine. Before blood is taken from a donor, a complete history is taken to make sure that person has  no history of diseases nor engages in risky social behavior (examples are intravenous drug use or sexual activity with multiple partners). The donor's travel history is screened to minimize risk of transmitting infections, such as malaria. The donated blood is tested for signs of infectious diseases, such as HIV and hepatitis. The blood is then tested to be sure it is compatible with you in order to minimize the chance of a transfusion reaction. If you or a relative donates blood, this is often done in anticipation of surgery and is not appropriate for emergency situations. It takes many days to process the donated blood. RISKS AND COMPLICATIONS Although transfusion therapy is very safe and saves many lives, the main dangers of transfusion include:   Getting an infectious disease.  Developing a transfusion reaction. This is an allergic reaction to something in the blood you were given. Every precaution is taken to prevent this. The decision to have a blood transfusion has been  considered carefully by your caregiver before blood is given. Blood is not given unless the benefits outweigh the risks. AFTER THE TRANSFUSION  Right after receiving a blood transfusion, you will usually feel much better and more energetic. This is especially true if your red blood cells have gotten low (anemic). The transfusion raises the level of the red blood cells which carry oxygen, and this usually causes an energy increase.  The nurse administering the transfusion will monitor you carefully for complications. HOME CARE INSTRUCTIONS  No special instructions are needed after a transfusion. You may find your energy is better. Speak with your caregiver about any limitations on activity for underlying diseases you may have. SEEK MEDICAL CARE IF:   Your condition is not improving after your transfusion.  You develop redness or irritation at the intravenous (IV) site. SEEK IMMEDIATE MEDICAL CARE IF:  Any of the following symptoms occur over the next 12 hours:  Shaking chills.  You have a temperature by mouth above 102 F (38.9 C), not controlled by medicine.  Chest, back, or muscle pain.  People around you feel you are not acting correctly or are confused.  Shortness of breath or difficulty breathing.  Dizziness and fainting.  You get a rash or develop hives.  You have a decrease in urine output.  Your urine turns a dark color or changes to pink, red, or brown. Any of the following symptoms occur over the next 10 days:  You have a temperature by mouth above 102 F (38.9 C), not controlled by medicine.  Shortness of breath.  Weakness after normal activity.  The white part of the eye turns yellow (jaundice).  You have a decrease in the amount of urine or are urinating less often.  Your urine turns a dark color or changes to pink, red, or brown. Document Released: 07/18/2000 Document Revised: 10/13/2011 Document Reviewed: 03/06/2008 [facility] Patient Information 2014  [place], [place].  _______________________________________________________________________

## 2018-06-14 ENCOUNTER — Encounter (HOSPITAL_COMMUNITY): Payer: Self-pay

## 2018-06-14 ENCOUNTER — Encounter (HOSPITAL_COMMUNITY)
Admission: RE | Admit: 2018-06-14 | Discharge: 2018-06-14 | Disposition: A | Discharge status: Home / Self Care | Payer: Federal, State, Local not specified - PPO | Source: Ambulatory Visit | Attending: General Surgery | Admitting: General Surgery

## 2018-06-14 ENCOUNTER — Other Ambulatory Visit: Payer: Self-pay

## 2018-06-14 DIAGNOSIS — Z01812 Encounter for preprocedural laboratory examination: Secondary | ICD-10-CM | POA: Diagnosis not present

## 2018-06-14 DIAGNOSIS — Z6837 Body mass index (BMI) 37.0-37.9, adult: Secondary | ICD-10-CM | POA: Diagnosis not present

## 2018-06-14 DIAGNOSIS — E669 Obesity, unspecified: Secondary | ICD-10-CM | POA: Diagnosis not present

## 2018-06-14 HISTORY — DX: Personal history of urinary calculi: Z87.442

## 2018-06-14 LAB — COMPREHENSIVE METABOLIC PANEL
ALK PHOS: 62 U/L (ref 38–126)
ALT: 42 U/L (ref 0–44)
ANION GAP: 7 (ref 5–15)
AST: 30 U/L (ref 15–41)
Albumin: 3.8 g/dL (ref 3.5–5.0)
BUN: 11 mg/dL (ref 6–20)
CO2: 30 mmol/L (ref 22–32)
Calcium: 10 mg/dL (ref 8.9–10.3)
Chloride: 102 mmol/L (ref 98–111)
Creatinine, Ser: 0.77 mg/dL (ref 0.44–1.00)
GFR calc non Af Amer: >60 mL/min (ref >60)
Glucose, Bld: 120 mg/dL — ABNORMAL HIGH (ref 70–99)
Potassium: 4.3 mmol/L (ref 3.5–5.1)
SODIUM: 139 mmol/L (ref 135–145)
Total Bilirubin: 0.5 mg/dL (ref 0.3–1.2)
Total Protein: 7.1 g/dL (ref 6.5–8.1)

## 2018-06-14 LAB — CBC WITH DIFFERENTIAL/PLATELET
Abs Immature Granulocytes: 0.01 10*3/uL (ref 0.00–0.07)
BASOS ABS: 0.1 10*3/uL (ref 0.0–0.1)
Basophils Relative: 1 %
Eosinophils Absolute: 0.2 10*3/uL (ref 0.0–0.5)
Eosinophils Relative: 3 %
HEMATOCRIT: 45.2 % (ref 36.0–46.0)
HEMOGLOBIN: 13.6 g/dL (ref 12.0–15.0)
IMMATURE GRANULOCYTES: 0 %
Lymphocytes Relative: 37 %
Lymphs Abs: 2 10*3/uL (ref 0.7–4.0)
MCH: 22.6 pg — ABNORMAL LOW (ref 26.0–34.0)
MCHC: 30.1 g/dL (ref 30.0–36.0)
MCV: 75.1 fL — AB (ref 80.0–100.0)
MONO ABS: 0.7 10*3/uL (ref 0.1–1.0)
MONOS PCT: 12 %
NEUTROS PCT: 47 %
Neutro Abs: 2.5 10*3/uL (ref 1.7–7.7)
PLATELETS: 266 10*3/uL (ref 150–400)
RBC: 6.02 MIL/uL — ABNORMAL HIGH (ref 3.87–5.11)
RDW: 14.5 % (ref 11.5–15.5)
WBC: 5.4 10*3/uL (ref 4.0–10.5)
nRBC: 0 % (ref 0.0–0.2)

## 2018-06-14 LAB — ABO/RH: ABO/RH(D): B POS

## 2018-06-21 ENCOUNTER — Telehealth: Payer: Self-pay | Admitting: Skilled Nursing Facility1

## 2018-06-21 MED ORDER — BUPIVACAINE LIPOSOME 1.3 % IJ SUSP
20.0000 mL | Freq: Once | INTRAMUSCULAR | Status: DC
  Filled 2018-06-21: qty 20

## 2018-06-21 NOTE — Telephone Encounter (Signed)
Dietitian called pt to see if she had an questions.   Pt did have questions:  Pt did have a number of questions but was only able to ask a couple due to being at work.  Questions in reference to multivitamin, options to eat after surgery, and calcium

## 2018-06-22 ENCOUNTER — Encounter (HOSPITAL_COMMUNITY): Payer: Self-pay | Admitting: *Deleted

## 2018-06-22 ENCOUNTER — Inpatient Hospital Stay (HOSPITAL_COMMUNITY): Payer: Federal, State, Local not specified - PPO | Admitting: Registered Nurse

## 2018-06-22 ENCOUNTER — Other Ambulatory Visit: Payer: Self-pay

## 2018-06-22 ENCOUNTER — Encounter (HOSPITAL_COMMUNITY)
Admission: RE | Disposition: A | Discharge status: Home / Self Care | Payer: Self-pay | Source: Ambulatory Visit | Attending: General Surgery

## 2018-06-22 ENCOUNTER — Inpatient Hospital Stay (HOSPITAL_COMMUNITY)
Admission: RE | Admit: 2018-06-22 | Discharge: 2018-06-23 | DRG: 621 | Disposition: A | Discharge status: Home / Self Care | Payer: Federal, State, Local not specified - PPO | Source: Ambulatory Visit | Attending: General Surgery | Admitting: General Surgery

## 2018-06-22 DIAGNOSIS — M545 Low back pain, unspecified: Secondary | ICD-10-CM | POA: Diagnosis present

## 2018-06-22 DIAGNOSIS — E119 Type 2 diabetes mellitus without complications: Secondary | ICD-10-CM | POA: Diagnosis not present

## 2018-06-22 DIAGNOSIS — F329 Major depressive disorder, single episode, unspecified: Secondary | ICD-10-CM | POA: Diagnosis present

## 2018-06-22 DIAGNOSIS — G8929 Other chronic pain: Secondary | ICD-10-CM | POA: Diagnosis not present

## 2018-06-22 DIAGNOSIS — Z6837 Body mass index (BMI) 37.0-37.9, adult: Secondary | ICD-10-CM | POA: Diagnosis not present

## 2018-06-22 DIAGNOSIS — K210 Gastro-esophageal reflux disease with esophagitis: Secondary | ICD-10-CM | POA: Diagnosis present

## 2018-06-22 DIAGNOSIS — K219 Gastro-esophageal reflux disease without esophagitis: Secondary | ICD-10-CM | POA: Diagnosis not present

## 2018-06-22 DIAGNOSIS — Z9884 Bariatric surgery status: Secondary | ICD-10-CM

## 2018-06-22 DIAGNOSIS — Z87891 Personal history of nicotine dependence: Secondary | ICD-10-CM | POA: Diagnosis not present

## 2018-06-22 DIAGNOSIS — R7303 Prediabetes: Secondary | ICD-10-CM | POA: Diagnosis present

## 2018-06-22 DIAGNOSIS — Z79899 Other long term (current) drug therapy: Secondary | ICD-10-CM

## 2018-06-22 DIAGNOSIS — Z886 Allergy status to analgesic agent status: Secondary | ICD-10-CM | POA: Diagnosis not present

## 2018-06-22 DIAGNOSIS — F419 Anxiety disorder, unspecified: Secondary | ICD-10-CM | POA: Diagnosis present

## 2018-06-22 DIAGNOSIS — M549 Dorsalgia, unspecified: Secondary | ICD-10-CM | POA: Diagnosis present

## 2018-06-22 DIAGNOSIS — M19042 Primary osteoarthritis, left hand: Secondary | ICD-10-CM | POA: Diagnosis present

## 2018-06-22 DIAGNOSIS — Z6836 Body mass index (BMI) 36.0-36.9, adult: Secondary | ICD-10-CM

## 2018-06-22 DIAGNOSIS — M19041 Primary osteoarthritis, right hand: Secondary | ICD-10-CM | POA: Diagnosis not present

## 2018-06-22 DIAGNOSIS — E669 Obesity, unspecified: Secondary | ICD-10-CM | POA: Diagnosis present

## 2018-06-22 DIAGNOSIS — I10 Essential (primary) hypertension: Secondary | ICD-10-CM | POA: Diagnosis present

## 2018-06-22 HISTORY — PX: GASTRIC ROUX-EN-Y: SHX5262

## 2018-06-22 HISTORY — DX: Bariatric surgery status: Z98.84

## 2018-06-22 LAB — TYPE AND SCREEN
ABO/RH(D): B POS
ANTIBODY SCREEN: NEGATIVE

## 2018-06-22 LAB — HEMOGLOBIN AND HEMATOCRIT, BLOOD
HCT: 44.3 % (ref 36.0–46.0)
HEMOGLOBIN: 13.5 g/dL (ref 12.0–15.0)

## 2018-06-22 LAB — PREGNANCY, URINE: Preg Test, Ur: NEGATIVE

## 2018-06-22 SURGERY — LAPAROSCOPIC ROUX-EN-Y GASTRIC BYPASS WITH UPPER ENDOSCOPY
Anesthesia: General

## 2018-06-22 MED ORDER — CHLORHEXIDINE GLUCONATE 4 % EX LIQD: 60.0000 mL | Freq: Once | CUTANEOUS | Status: DC

## 2018-06-22 MED ORDER — FENTANYL CITRATE (PF) 100 MCG/2ML IJ SOLN
25.0000 ug | INTRAMUSCULAR | Status: DC | PRN
  Administered 2018-06-22 (×3): 50 ug via INTRAVENOUS

## 2018-06-22 MED ORDER — 0.9 % SODIUM CHLORIDE (POUR BTL) OPTIME
TOPICAL | Status: DC | PRN
  Administered 2018-06-22: 1000 mL

## 2018-06-22 MED ORDER — EVICEL 5 ML EX KIT
PACK | Freq: Once | CUTANEOUS | Status: AC
  Administered 2018-06-22: 1
  Filled 2018-06-22: qty 1

## 2018-06-22 MED ORDER — ACETAMINOPHEN 160 MG/5ML PO SOLN
650.0000 mg | Freq: Four times a day (QID) | ORAL | Status: DC
  Administered 2018-06-22 – 2018-06-23 (×4): 650 mg via ORAL
  Filled 2018-06-22 (×4): qty 20.3

## 2018-06-22 MED ORDER — SCOPOLAMINE 1 MG/3DAYS TD PT72
1.0000 | MEDICATED_PATCH | TRANSDERMAL | Status: DC
  Administered 2018-06-22: 1.5 mg via TRANSDERMAL
  Filled 2018-06-22: qty 1

## 2018-06-22 MED ORDER — DEXAMETHASONE SODIUM PHOSPHATE 10 MG/ML IJ SOLN
INTRAMUSCULAR | Status: AC
  Filled 2018-06-22: qty 1

## 2018-06-22 MED ORDER — ONDANSETRON HCL 4 MG/2ML IJ SOLN
INTRAMUSCULAR | Status: AC
  Filled 2018-06-22: qty 2

## 2018-06-22 MED ORDER — LACTATED RINGERS IR SOLN
Status: DC | PRN
  Administered 2018-06-22: 1000 mL

## 2018-06-22 MED ORDER — FENTANYL CITRATE (PF) 100 MCG/2ML IJ SOLN
INTRAMUSCULAR | Status: DC | PRN
  Administered 2018-06-22: 25 ug via INTRAVENOUS
  Administered 2018-06-22: 100 ug via INTRAVENOUS
  Administered 2018-06-22: 50 ug via INTRAVENOUS

## 2018-06-22 MED ORDER — ALBUTEROL SULFATE (2.5 MG/3ML) 0.083% IN NEBU: 3.0000 mL | INHALATION_SOLUTION | Freq: Four times a day (QID) | RESPIRATORY_TRACT | Status: DC | PRN

## 2018-06-22 MED ORDER — MORPHINE SULFATE (PF) 4 MG/ML IV SOLN
1.0000 mg | INTRAVENOUS | Status: DC | PRN
  Administered 2018-06-22 (×2): 2 mg via INTRAVENOUS
  Filled 2018-06-22 (×2): qty 1

## 2018-06-22 MED ORDER — APREPITANT 40 MG PO CAPS
40.0000 mg | ORAL_CAPSULE | ORAL | Status: AC
  Administered 2018-06-22: 40 mg via ORAL
  Filled 2018-06-22: qty 1

## 2018-06-22 MED ORDER — DEXAMETHASONE SODIUM PHOSPHATE 10 MG/ML IJ SOLN
INTRAMUSCULAR | Status: DC | PRN
  Administered 2018-06-22: 8 mg via INTRAVENOUS

## 2018-06-22 MED ORDER — HYDRALAZINE HCL 20 MG/ML IJ SOLN
INTRAMUSCULAR | Status: AC
  Filled 2018-06-22: qty 1

## 2018-06-22 MED ORDER — PHENYLEPHRINE 40 MCG/ML (10ML) SYRINGE FOR IV PUSH (FOR BLOOD PRESSURE SUPPORT)
PREFILLED_SYRINGE | INTRAVENOUS | Status: DC | PRN
  Administered 2018-06-22 (×2): 80 ug via INTRAVENOUS

## 2018-06-22 MED ORDER — SIMETHICONE 80 MG PO CHEW: 80.0000 mg | CHEWABLE_TABLET | Freq: Four times a day (QID) | ORAL | Status: DC | PRN

## 2018-06-22 MED ORDER — METOPROLOL TARTRATE 5 MG/5ML IV SOLN
5.0000 mg | Freq: Four times a day (QID) | INTRAVENOUS | Status: DC
  Administered 2018-06-22 – 2018-06-23 (×4): 5 mg via INTRAVENOUS
  Filled 2018-06-22 (×4): qty 5

## 2018-06-22 MED ORDER — LIDOCAINE 2% (20 MG/ML) 5 ML SYRINGE
INTRAMUSCULAR | Status: DC | PRN
  Administered 2018-06-22: 1.5 mg/kg/h via INTRAVENOUS

## 2018-06-22 MED ORDER — DEXAMETHASONE SODIUM PHOSPHATE 4 MG/ML IJ SOLN: 4.0000 mg | INTRAMUSCULAR | Status: DC

## 2018-06-22 MED ORDER — ROCURONIUM BROMIDE 100 MG/10ML IV SOLN
INTRAVENOUS | Status: AC
  Filled 2018-06-22: qty 1

## 2018-06-22 MED ORDER — STERILE WATER FOR IRRIGATION IR SOLN
Status: DC | PRN
  Administered 2018-06-22: 1000 mL

## 2018-06-22 MED ORDER — ROCURONIUM BROMIDE 10 MG/ML (PF) SYRINGE
PREFILLED_SYRINGE | INTRAVENOUS | Status: DC | PRN
  Administered 2018-06-22: 20 mg via INTRAVENOUS
  Administered 2018-06-22: 60 mg via INTRAVENOUS

## 2018-06-22 MED ORDER — PHENYLEPHRINE 40 MCG/ML (10ML) SYRINGE FOR IV PUSH (FOR BLOOD PRESSURE SUPPORT)
PREFILLED_SYRINGE | INTRAVENOUS | Status: AC
  Filled 2018-06-22: qty 10

## 2018-06-22 MED ORDER — LIDOCAINE 2% (20 MG/ML) 5 ML SYRINGE
INTRAMUSCULAR | Status: AC
  Filled 2018-06-22: qty 5

## 2018-06-22 MED ORDER — KCL IN DEXTROSE-NACL 20-5-0.45 MEQ/L-%-% IV SOLN
INTRAVENOUS | Status: DC
  Administered 2018-06-22 – 2018-06-23 (×3): via INTRAVENOUS
  Filled 2018-06-22 (×3): qty 1000

## 2018-06-22 MED ORDER — PROPOFOL 10 MG/ML IV BOLUS
INTRAVENOUS | Status: AC
  Filled 2018-06-22: qty 20

## 2018-06-22 MED ORDER — HYDRALAZINE HCL 20 MG/ML IJ SOLN
INTRAMUSCULAR | Status: DC | PRN
  Administered 2018-06-22: 10 mg via INTRAVENOUS

## 2018-06-22 MED ORDER — PROMETHAZINE HCL 25 MG/ML IJ SOLN: 6.2500 mg | INTRAMUSCULAR | Status: DC | PRN

## 2018-06-22 MED ORDER — LABETALOL HCL 5 MG/ML IV SOLN
10.0000 mg | Freq: Once | INTRAVENOUS | Status: AC
  Administered 2018-06-22: 10 mg via INTRAVENOUS

## 2018-06-22 MED ORDER — SUGAMMADEX SODIUM 500 MG/5ML IV SOLN
INTRAVENOUS | Status: DC | PRN
  Administered 2018-06-22: 300 mg via INTRAVENOUS

## 2018-06-22 MED ORDER — ONDANSETRON HCL 4 MG/2ML IJ SOLN
INTRAMUSCULAR | Status: DC | PRN
  Administered 2018-06-22: 4 mg via INTRAVENOUS

## 2018-06-22 MED ORDER — PROPOFOL 10 MG/ML IV BOLUS
INTRAVENOUS | Status: DC | PRN
  Administered 2018-06-22: 200 mg via INTRAVENOUS

## 2018-06-22 MED ORDER — DIPHENHYDRAMINE HCL 50 MG/ML IJ SOLN
INTRAMUSCULAR | Status: DC | PRN
  Administered 2018-06-22: 12.5 mg via INTRAVENOUS

## 2018-06-22 MED ORDER — LABETALOL HCL 5 MG/ML IV SOLN
INTRAVENOUS | Status: AC
  Filled 2018-06-22: qty 4

## 2018-06-22 MED ORDER — MIDAZOLAM HCL 5 MG/5ML IJ SOLN
INTRAMUSCULAR | Status: DC | PRN
  Administered 2018-06-22: 2 mg via INTRAVENOUS

## 2018-06-22 MED ORDER — OXYCODONE HCL 5 MG/5ML PO SOLN
5.0000 mg | ORAL | Status: DC | PRN
  Administered 2018-06-23 (×2): 5 mg via ORAL
  Filled 2018-06-22 (×2): qty 5

## 2018-06-22 MED ORDER — ACETAMINOPHEN 500 MG PO TABS
1000.0000 mg | ORAL_TABLET | ORAL | Status: AC
  Administered 2018-06-22: 1000 mg via ORAL
  Filled 2018-06-22: qty 2

## 2018-06-22 MED ORDER — LIDOCAINE HCL 2 % IJ SOLN
INTRAMUSCULAR | Status: AC
  Filled 2018-06-22: qty 20

## 2018-06-22 MED ORDER — EPHEDRINE 5 MG/ML INJ
INTRAVENOUS | Status: AC
  Filled 2018-06-22: qty 10

## 2018-06-22 MED ORDER — DIPHENHYDRAMINE HCL 50 MG/ML IJ SOLN: 12.5000 mg | Freq: Three times a day (TID) | INTRAMUSCULAR | Status: DC | PRN

## 2018-06-22 MED ORDER — LACTATED RINGERS IV SOLN
INTRAVENOUS | Status: DC
  Administered 2018-06-22 (×2): via INTRAVENOUS

## 2018-06-22 MED ORDER — HYDRALAZINE HCL 20 MG/ML IJ SOLN: 10.0000 mg | INTRAMUSCULAR | Status: DC | PRN

## 2018-06-22 MED ORDER — FENTANYL CITRATE (PF) 100 MCG/2ML IJ SOLN
INTRAMUSCULAR | Status: AC
  Filled 2018-06-22: qty 4

## 2018-06-22 MED ORDER — SUGAMMADEX SODIUM 500 MG/5ML IV SOLN
INTRAVENOUS | Status: AC
  Filled 2018-06-22: qty 5

## 2018-06-22 MED ORDER — EPHEDRINE SULFATE-NACL 50-0.9 MG/10ML-% IV SOSY
PREFILLED_SYRINGE | INTRAVENOUS | Status: DC | PRN
  Administered 2018-06-22 (×2): 10 mg via INTRAVENOUS
  Administered 2018-06-22: 5 mg via INTRAVENOUS

## 2018-06-22 MED ORDER — BUPIVACAINE LIPOSOME 1.3 % IJ SUSP
INTRAMUSCULAR | Status: DC | PRN
  Administered 2018-06-22: 20 mL

## 2018-06-22 MED ORDER — PREMIER PROTEIN SHAKE
2.0000 [oz_av] | ORAL | Status: DC
  Administered 2018-06-23 (×4): 2 [oz_av] via ORAL

## 2018-06-22 MED ORDER — SODIUM CHLORIDE (PF) 0.9 % IJ SOLN
INTRAMUSCULAR | Status: AC
  Filled 2018-06-22: qty 50

## 2018-06-22 MED ORDER — LIDOCAINE 2% (20 MG/ML) 5 ML SYRINGE
INTRAMUSCULAR | Status: DC | PRN
  Administered 2018-06-22: 60 mg via INTRAVENOUS

## 2018-06-22 MED ORDER — MIDAZOLAM HCL 2 MG/2ML IJ SOLN
INTRAMUSCULAR | Status: AC
  Filled 2018-06-22: qty 2

## 2018-06-22 MED ORDER — PROMETHAZINE HCL 25 MG/ML IJ SOLN: 12.5000 mg | Freq: Four times a day (QID) | INTRAMUSCULAR | Status: DC | PRN

## 2018-06-22 MED ORDER — HEPARIN SODIUM (PORCINE) 5000 UNIT/ML IJ SOLN
5000.0000 [IU] | INTRAMUSCULAR | Status: AC
  Administered 2018-06-22: 5000 [IU] via SUBCUTANEOUS
  Filled 2018-06-22: qty 1

## 2018-06-22 MED ORDER — SODIUM CHLORIDE (PF) 0.9 % IJ SOLN
INTRAMUSCULAR | Status: DC | PRN
  Administered 2018-06-22: 50 mL

## 2018-06-22 MED ORDER — FENTANYL CITRATE (PF) 250 MCG/5ML IJ SOLN
INTRAMUSCULAR | Status: AC
  Filled 2018-06-22: qty 5

## 2018-06-22 MED ORDER — PANTOPRAZOLE SODIUM 40 MG IV SOLR
40.0000 mg | Freq: Every day | INTRAVENOUS | Status: DC
  Administered 2018-06-22: 40 mg via INTRAVENOUS
  Filled 2018-06-22: qty 40

## 2018-06-22 MED ORDER — GABAPENTIN 300 MG PO CAPS
300.0000 mg | ORAL_CAPSULE | ORAL | Status: AC
  Administered 2018-06-22: 300 mg via ORAL
  Filled 2018-06-22: qty 1

## 2018-06-22 MED ORDER — ENOXAPARIN SODIUM 30 MG/0.3ML ~~LOC~~ SOLN
30.0000 mg | Freq: Two times a day (BID) | SUBCUTANEOUS | Status: DC
  Administered 2018-06-22 – 2018-06-23 (×2): 30 mg via SUBCUTANEOUS
  Filled 2018-06-22 (×2): qty 0.3

## 2018-06-22 MED ORDER — ONDANSETRON HCL 4 MG/2ML IJ SOLN
4.0000 mg | Freq: Four times a day (QID) | INTRAMUSCULAR | Status: DC | PRN
  Administered 2018-06-23: 4 mg via INTRAVENOUS

## 2018-06-22 MED ORDER — DIPHENHYDRAMINE HCL 50 MG/ML IJ SOLN
INTRAMUSCULAR | Status: AC
  Filled 2018-06-22: qty 1

## 2018-06-22 MED ORDER — SODIUM CHLORIDE 0.9 % IV SOLN
2.0000 g | INTRAVENOUS | Status: AC
  Administered 2018-06-22: 2 g via INTRAVENOUS
  Filled 2018-06-22: qty 2

## 2018-06-22 SURGICAL SUPPLY — 81 items
APPLICATOR COTTON TIP 6 STRL (MISCELLANEOUS) IMPLANT
APPLICATOR COTTON TIP 6IN STRL (MISCELLANEOUS)
APPLIER CLIP ROT 13.4 12 LRG (CLIP)
BANDAGE ADH SHEER 1  50/CT (GAUZE/BANDAGES/DRESSINGS) ×18 IMPLANT
BENZOIN TINCTURE PRP APPL 2/3 (GAUZE/BANDAGES/DRESSINGS) ×3 IMPLANT
BLADE SURG SZ11 CARB STEEL (BLADE) ×3 IMPLANT
CABLE HIGH FREQUENCY MONO STRZ (ELECTRODE) IMPLANT
CHLORAPREP W/TINT 26ML (MISCELLANEOUS) ×3 IMPLANT
CLIP APPLIE ROT 13.4 12 LRG (CLIP) IMPLANT
CLIP SUT LAPRA TY ABSORB (SUTURE) ×6 IMPLANT
CLOSURE WOUND 1/2 X4 (GAUZE/BANDAGES/DRESSINGS) ×1
COVER WAND RF STERILE (DRAPES) ×3 IMPLANT
CUTTER FLEX LINEAR 45M (STAPLE) IMPLANT
DEVICE SUT QUICK LOAD TK 5 (STAPLE) IMPLANT
DEVICE SUT TI-KNOT TK 5X26 (MISCELLANEOUS) IMPLANT
DEVICE SUTURE ENDOST 10MM (ENDOMECHANICALS) ×3 IMPLANT
DEVICE TI KNOT TK5 (MISCELLANEOUS)
DRAIN PENROSE 18X1/4 LTX STRL (WOUND CARE) ×3 IMPLANT
ELECT REM PT RETURN 15FT ADLT (MISCELLANEOUS) ×3 IMPLANT
GAUZE 4X4 16PLY RFD (DISPOSABLE) ×3 IMPLANT
GAUZE SPONGE 4X4 12PLY STRL (GAUZE/BANDAGES/DRESSINGS) IMPLANT
GLOVE BIO SURGEON STRL SZ 6.5 (GLOVE) ×2 IMPLANT
GLOVE BIO SURGEON STRL SZ7.5 (GLOVE) ×3 IMPLANT
GLOVE BIO SURGEONS STRL SZ 6.5 (GLOVE) ×1
GLOVE BIOGEL PI IND STRL 6.5 (GLOVE) ×1 IMPLANT
GLOVE BIOGEL PI IND STRL 7.0 (GLOVE) ×2 IMPLANT
GLOVE BIOGEL PI INDICATOR 6.5 (GLOVE) ×2
GLOVE BIOGEL PI INDICATOR 7.0 (GLOVE) ×4
GLOVE INDICATOR 8.0 STRL GRN (GLOVE) ×3 IMPLANT
GLOVE SURG SS PI 7.0 STRL IVOR (GLOVE) ×3 IMPLANT
GOWN STRL REUS W/ TWL LRG LVL3 (GOWN DISPOSABLE) ×2 IMPLANT
GOWN STRL REUS W/TWL LRG LVL3 (GOWN DISPOSABLE) ×4
GOWN STRL REUS W/TWL XL LVL3 (GOWN DISPOSABLE) ×6 IMPLANT
HOVERMATT SINGLE USE (MISCELLANEOUS) ×3 IMPLANT
KIT BASIN OR (CUSTOM PROCEDURE TRAY) ×3 IMPLANT
KIT CLEAN ENDO COMPLIANCE (KITS) ×3 IMPLANT
KIT GASTRIC LAVAGE 34FR ADT (SET/KITS/TRAYS/PACK) ×3 IMPLANT
MARKER SKIN DUAL TIP RULER LAB (MISCELLANEOUS) ×3 IMPLANT
NEEDLE SPNL 22GX3.5 QUINCKE BK (NEEDLE) ×3 IMPLANT
PACK CARDIOVASCULAR III (CUSTOM PROCEDURE TRAY) ×3 IMPLANT
QUICK LOAD TK 5 (STAPLE)
RELOAD 45 VASCULAR/THIN (ENDOMECHANICALS) IMPLANT
RELOAD ENDO STITCH 2.0 (ENDOMECHANICALS) ×20
RELOAD STAPLE TA45 3.5 REG BLU (ENDOMECHANICALS) IMPLANT
RELOAD STAPLER BLUE 60MM (STAPLE) ×4 IMPLANT
RELOAD STAPLER GOLD 60MM (STAPLE) ×1 IMPLANT
RELOAD STAPLER WHITE 60MM (STAPLE) ×3 IMPLANT
SCISSORS LAP 5X45 EPIX DISP (ENDOMECHANICALS) ×3 IMPLANT
SET IRRIG TUBING LAPAROSCOPIC (IRRIGATION / IRRIGATOR) ×3 IMPLANT
SHEARS HARMONIC ACE PLUS 45CM (MISCELLANEOUS) ×3 IMPLANT
SLEEVE XCEL OPT CAN 5 100 (ENDOMECHANICALS) ×3 IMPLANT
SOLUTION ANTI FOG 6CC (MISCELLANEOUS) ×3 IMPLANT
STAPLER ECHELON BIOABSB 60 FLE (MISCELLANEOUS) IMPLANT
STAPLER ECHELON LONG 60 440 (INSTRUMENTS) ×3 IMPLANT
STAPLER RELOAD BLUE 60MM (STAPLE) ×12
STAPLER RELOAD GOLD 60MM (STAPLE) ×3
STAPLER RELOAD WHITE 60MM (STAPLE) ×9
STRIP CLOSURE SKIN 1/2X4 (GAUZE/BANDAGES/DRESSINGS) ×2 IMPLANT
SURGILUBE 2OZ TUBE FLIPTOP (MISCELLANEOUS) ×3 IMPLANT
SUT MNCRL AB 4-0 PS2 18 (SUTURE) ×3 IMPLANT
SUT RELOAD ENDO STITCH 2 48X1 (ENDOMECHANICALS) ×6
SUT RELOAD ENDO STITCH 2.0 (ENDOMECHANICALS) ×4
SUT SURGIDAC NAB ES-9 0 48 120 (SUTURE) IMPLANT
SUT VIC AB 2-0 SH 27 (SUTURE) ×2
SUT VIC AB 2-0 SH 27X BRD (SUTURE) ×1 IMPLANT
SUTURE RELOAD END STTCH 2 48X1 (ENDOMECHANICALS) ×6 IMPLANT
SUTURE RELOAD ENDO STITCH 2.0 (ENDOMECHANICALS) ×4 IMPLANT
SYR 10ML ECCENTRIC (SYRINGE) ×3 IMPLANT
SYR 20CC LL (SYRINGE) ×6 IMPLANT
SYR 50ML LL SCALE MARK (SYRINGE) ×3 IMPLANT
TIP RIGID 35CM EVICEL (HEMOSTASIS) ×3 IMPLANT
TOWEL OR 17X26 10 PK STRL BLUE (TOWEL DISPOSABLE) ×3 IMPLANT
TOWEL OR NON WOVEN STRL DISP B (DISPOSABLE) ×3 IMPLANT
TROCAR BLADELESS OPT 5 100 (ENDOMECHANICALS) ×3 IMPLANT
TROCAR UNIVERSAL OPT 12M 100M (ENDOMECHANICALS) ×9 IMPLANT
TROCAR XCEL 12X100 BLDLESS (ENDOMECHANICALS) ×3 IMPLANT
TUBING CONNECTING 10 (TUBING) ×4 IMPLANT
TUBING CONNECTING 10' (TUBING) ×2
TUBING ENDO SMARTCAP (MISCELLANEOUS) ×3 IMPLANT
TUBING INSUF HEATED (TUBING) ×3 IMPLANT
VALVE SET DISP 3 PC AWS (VALVE) ×3 IMPLANT

## 2018-06-22 NOTE — Op Note (Signed)
Preoperative diagnosis: Roux-en-Y gastric bypass  Postoperative diagnosis: Same   Procedure: Upper endoscopy   Surgeon: Gurney Maxin, M.D.  Anesthesia: Gen.   Indications for procedure: This patient was undergoing a Roux-en-Y gastric bypass.   Description of procedure: The endoscopy was placed in the mouth and into the oropharynx and under endoscopic vision it was advanced to the esophagogastric junction. The pouch was insufflated and no bleeding or bubbles were seen. The GEJ was identified at 42cm from the teeth. The anastomosis was seen at 47cm. No bleeding or leaks were detected. The scope was withdrawn without difficulty.   Gurney Maxin, M.D. General, Bariatric, & Minimally Invasive Surgery [facility]

## 2018-06-22 NOTE — Progress Notes (Signed)
Discussed post op day goals with patient including ambulation, IS, diet progression, pain, and nausea control.  Questions answered. 

## 2018-06-22 NOTE — Interval H&P Note (Signed)
History and Physical Interval Note:  06/22/2018 7:10 AM  Mary Sanford  has presented today for surgery, with the diagnosis of Morbid Obesity, HTN, GERD, Asthma, Pre Diabetes, Joint and back pain, Osteoarthritis  The various methods of treatment have been discussed with the patient and family. After consideration of risks, benefits and other options for treatment, the patient has consented to  Procedure(s): LAPAROSCOPIC ROUX-EN-Y GASTRIC BYPASS WITH UPPER ENDOSCOPY (N/A) as a surgical intervention .  The patient's history has been reviewed, patient examined, no change in status, stable for surgery.  I have reviewed the patient's chart and labs.  Questions were answered to the patient's satisfaction.    Leighton Ruff. Redmond Pulling, MD, FACS General, Bariatric, & Minimally Invasive Surgery [facility]  [place]

## 2018-06-22 NOTE — Anesthesia Postprocedure Evaluation (Signed)
Anesthesia Post Note  Patient: MAKYNLI STILLS  Procedure(s) Performed: LAPAROSCOPIC ROUX-EN-Y GASTRIC BYPASS WITH UPPER ENDOSCOPY (N/A )     Patient location during evaluation: PACU Anesthesia Type: General Level of consciousness: awake and alert, awake and oriented Pain management: pain level controlled Vital Signs Assessment: post-procedure vital signs reviewed and stable Respiratory status: spontaneous breathing, nonlabored ventilation and respiratory function stable Cardiovascular status: blood pressure returned to baseline and stable Postop Assessment: no apparent nausea or vomiting Anesthetic complications: no    Last Vitals:  Vitals:   06/22/18 1333 06/22/18 1436  BP: 140/81 (!) 145/83  Pulse: 81 75  Resp: 17 17  Temp: 36.8 C 37.2 C  SpO2: 100% 100%    Last Pain:  Vitals:   06/22/18 1436  TempSrc: Oral  PainSc:                  Catalina Gravel

## 2018-06-22 NOTE — Anesthesia Preprocedure Evaluation (Signed)
Anesthesia Evaluation  Patient identified by MRN, date of birth, ID band Patient awake    Reviewed: Allergy & Precautions, NPO status , Patient's Chart, lab work & pertinent test results  Airway Mallampati: II  TM Distance: >3 FB Neck ROM: Full    Dental  (+) Teeth Intact, Dental Advisory Given   Pulmonary asthma , former smoker,    Pulmonary exam normal breath sounds clear to auscultation       Cardiovascular hypertension, Pt. on home beta blockers Normal cardiovascular exam Rhythm:Regular Rate:Normal     Neuro/Psych PSYCHIATRIC DISORDERS Anxiety Depression negative neurological ROS     GI/Hepatic Neg liver ROS, GERD  ,  Endo/Other  Obesity   Renal/GU negative Renal ROS     Musculoskeletal  (+) Arthritis ,   Abdominal   Peds  Hematology negative hematology ROS (+)   Anesthesia Other Findings Day of surgery medications reviewed with the patient.  Reproductive/Obstetrics                             Anesthesia Physical Anesthesia Plan  ASA: III  Anesthesia Plan: General   Post-op Pain Management:    Induction: Intravenous  PONV Risk Score and Plan: 4 or greater and Scopolamine patch - Pre-op, Midazolam, Ondansetron, Dexamethasone and Diphenhydramine  Airway Management Planned: Oral ETT  Additional Equipment:   Intra-op Plan:   Post-operative Plan: Extubation in OR  Informed Consent: I have reviewed the patients History and Physical, chart, labs and discussed the procedure including the risks, benefits and alternatives for the proposed anesthesia with the patient or authorized representative who has indicated his/her understanding and acceptance.   Dental advisory given  Plan Discussed with: CRNA  Anesthesia Plan Comments:         Anesthesia Quick Evaluation

## 2018-06-22 NOTE — Progress Notes (Signed)
PHARMACY CONSULT FOR:  Risk Assessment for Post-Discharge VTE Following Bariatric Surgery  Post-Discharge VTE Risk Assessment: This patient's probability of 30-day post-discharge VTE is increased due to the factors marked:   Female    Age >/=60 years    BMI >/=50 kg/m2    CHF    Dyspnea at Rest    Paraplegia  X  Non-gastric-band surgery    Operation Time >/=3 hr    Return to OR     Length of Stay >/= 3 d   Predicted probability of 30-day post-discharge VTE: 0.16%  Other patient-specific factors to consider: N/A   Recommendation for Discharge: No pharmacologic prophylaxis post-discharge    Will follow for LOS and for return to OR while patient admitted and adjust recommendations, if indicated.     Mary Sanford is a 52 y.o. female who underwent Roux-en-Y gastric bypass and upper endoscopy on 06/22/2018.    Case start: 0749 Case end: 0956   Allergies  Allergen Reactions  . Naproxen Other (See Comments)    Panic attacks    Patient Measurements: Height: 5\' 9"  (175.3 cm) Weight: 247 lb 6 oz (112.2 kg) IBW/kg (Calculated) : 66.2 Body mass index is 36.53 kg/m.  Recent Labs    06/22/18 1106  HGB 13.5  HCT 44.3   Estimated Creatinine Clearance: 111.1 mL/min (by C-G formula based on SCr of 0.77 mg/dL).    Past Medical History:  Diagnosis Date  . Allergy   . Anemia   . Anxiety   . Arthritis    arm/ wrist areas  . B12 deficiency   . Constipation    not chronic- now stools soft and regular   . Cough    non- productive  . Depression    no meds  . History of kidney stones   . Hypertension   . Kidney stones      Medications Prior to Admission  Medication Sig Dispense Refill Last Dose  . escitalopram (LEXAPRO) 10 MG tablet Take 1 tablet (10 mg total) by mouth daily. 90 tablet 1 06/21/2018 at Unknown time  . hydrochlorothiazide (HYDRODIURIL) 25 MG tablet TAKE 1 TABLET BY MOUTH ONCE DAILY 90 tablet 1 06/21/2018 at Unknown time  . nebivolol (BYSTOLIC) 5  MG tablet Take 1 tablet (5 mg total) by mouth daily. 90 tablet 0 06/21/2018 at Unknown time  . acetaminophen (TYLENOL) 500 MG tablet Take 1,000 mg by mouth every 6 (six) hours as needed for mild pain.    Past Week at Unknown time  . albuterol (PROVENTIL HFA;VENTOLIN HFA) 108 (90 Base) MCG/ACT inhaler Inhale 1-2 puffs into the lungs every 6 (six) hours as needed for wheezing or shortness of breath. 1 Inhaler 0 More than a month at Unknown time  . benzonatate (TESSALON) 100 MG capsule Take 1 capsule (100 mg total) by mouth 3 (three) times daily as needed for cough. (Patient not taking: Reported on 06/14/2018) 21 capsule 0 Not Taking at Unknown time  . dicyclomine (BENTYL) 20 MG tablet Take 1 tablet (20 mg total) by mouth 2 (two) times daily. (Patient not taking: Reported on 06/14/2018) 20 tablet 0 Not Taking at Unknown time  . fluticasone (FLONASE) 50 MCG/ACT nasal spray Place 2 sprays into both nostrils daily. (Patient not taking: Reported on 06/14/2018) 16 g 0 Not Taking at Unknown time  . metoCLOPramide (REGLAN) 10 MG tablet Take 1 tablet (10 mg total) by mouth every 6 (six) hours as needed for nausea. 30 tablet 0 Past Week at Unknown time  .  phentermine (ADIPEX-P) 37.5 MG tablet Take 1 tablet (37.5 mg total) by mouth daily before breakfast. (Patient not taking: Reported on 06/14/2018) 30 tablet 2 Not Taking at Unknown time  . potassium chloride (K-DUR,KLOR-CON) 10 MEQ tablet Take 1 tablet (10 mEq total) by mouth 2 (two) times daily. (Patient taking differently: Take 10 mEq by mouth daily. ) 180 tablet 1 06/07/2018 at Unknown time       Lindell Spar M 06/22/2018,1:49 PM

## 2018-06-22 NOTE — Transfer of Care (Signed)
Immediate Anesthesia Transfer of Care Note  Patient: Mary Sanford  Procedure(s) Performed: LAPAROSCOPIC ROUX-EN-Y GASTRIC BYPASS WITH UPPER ENDOSCOPY (N/A )  Patient Location: PACU  Anesthesia Type:General  Level of Consciousness: awake, alert , oriented and patient cooperative  Airway & Oxygen Therapy: Patient Spontanous Breathing and Patient connected to face mask oxygen  Post-op Assessment: Report given to RN, Post -op Vital signs reviewed and stable and Patient moving all extremities  Post vital signs: Reviewed and stable  Last Vitals:  Vitals Value Taken Time  BP 173/89 06/22/2018 10:06 AM  Temp    Pulse 102 06/22/2018 10:09 AM  Resp 20 06/22/2018 10:09 AM  SpO2 100 % 06/22/2018 10:09 AM  Vitals shown include unvalidated device data.  Last Pain:  Vitals:   06/22/18 0545  TempSrc: Oral         Complications: No apparent anesthesia complications

## 2018-06-22 NOTE — Op Note (Signed)
Mary Sanford [id number] 03/20/1966. 06/22/2018  Preoperative diagnosis:    Obesity (BMI 37   Essential hypertension, benign   Prediabetes   Chronic low back pain  Postoperative  diagnosis:  1. same  Surgical procedure: Laparoscopic Roux-en-Y gastric bypass (ante-colic, ante-gastric); upper endoscopy  Surgeon: Gayland Curry, M.D. FACS  Asst.: Gurney Maxin MD FACS  Anesthesia: General plus exparel/marcaine mix  Complications: None   EBL: Minimal   Drains: None   Disposition: PACU in good condition   Indications for procedure: 52 y.o. yo female with morbid obesity who has been unsuccessful at sustained weight loss. The patient's comorbidities are listed above. We discussed the risk and benefits of surgery including but not limited to anesthesia risk, bleeding, infection, blood clot formation, anastomotic leak, anastomotic stricture, ulcer formation, death, respiratory complications, intestinal blockage, internal hernia, gallstone formation, vitamin and nutritional deficiencies, injury to surrounding structures, failure to lose weight and mood changes.   Description of procedure: Patient is brought to the operating room and general anesthesia induced. The patient had received preoperative broad-spectrum IV antibiotics and subcutaneous heparin. The abdomen was widely sterilely prepped with Chloraprep and draped. Patient timeout was performed and correct patient and procedure confirmed. Access was obtained with a 12 mm Optiview trocar in the left upper quadrant and pneumoperitoneum established without difficulty. Under direct vision 12 mm trocars were placed laterally in the right upper quadrant, right upper quadrant midclavicular line, and to the left and above the umbilicus for the camera port. A 5 mm trocar was placed laterally in the left upper quadrant.  Exparel/marcaine mix was infiltrated in bilateral lateral abdominal walls as a TAP block. 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 The omentum was brought into the upper abdomen and the transverse mesocolon elevated and the ligament of Treitz clearly identified. A 40 cm biliopancreatic limb was then carefully measured from the ligament of Treitz. The small intestine was divided at this point with a single firing of the white load linear stapler. The distal end of the biliopancreatic end just next to the staple line appeared a little ischemic. I decided to resect the 2.5cm length. It transected with a 52mm linear white load of the Echelon stapler and removed from the abdomen to create a new stapled end of the BP limb.  A Penrose drain was sutured to the end of the Roux-en-Y limb for later identification. A 100 cm Roux-en-Y limb was then carefully measured. At this point a side-to-side anastomosis was created between the Roux limb and the end of the biliopancreatic limb. This was accomplished with a single firing of the 60 mm white load linear stapler. The common enterotomy was closed with a running 2-0 Vicryl begun at either end of the enterotomy and tied centrally. Eviceal tissue sealant was placed over the anastomosis. The mesenteric defect was then closed with running 2-0 silk. The omentum was then divided with the harmonic scalpel up towards the transverse colon to allow mobility of the Roux limb toward the gastric pouch. The patient was then placed in steep reversed Trendelenburg. Through a 5 mm subxiphoid site the [facility] retractor was placed and the left lobe of the liver elevated with excellent exposure of the upper stomach and hiatus. The angle of Hiss was then mobilized with the harmonic scalpel. A 4 cm gastric pouch was then carefully measured along the lesser curve of the stomach. Dissection was carried along the lesser curve at this point  with the Harmonic scalpel working carefully back toward the lesser sac at right angles to the lesser curve. The free lesser  sac was then entered. After being sure all tubes were removed from the stomach an initial firing of the gold load 60 mm linear stapler was fired at right angles across the lesser curve for about 4 cm. The gastric pouch was further mobilized posteriorly and then the pouch was completed with 3 further firings of the 60 mm blue load linear stapler up through the previously dissected angle of His. It was ensured that the pouch was completely mobilized away from the gastric remnant. This created a nice tubular 4-5 cm gastric pouch. The Roux limb was then brought up in an antecolic fashion with the candycane facing to the patient's left without undue tension. The gastrojejunostomy was created with an initial posterior row of 2-0 Vicryl between the Roux limb and the staple line of the gastric pouch. Enterotomies were then made in the gastric pouch and the Roux limb with the harmonic scalpel and at approximately 2-2-1/2 cm anastomosis was created with a single firing of the 54mm blue load linear stapler. The staple line was inspected and was intact without bleeding. The common enterotomy was then closed with running 2-0 Vicryl begun at either end and tied centrally. The Ewall tube was then easily passed through the anastomosis and an outer anterior layer of running 2-0 Vicryl was placed. The Ewald tube was removed. With the outlet of the gastrojejunostomy clamped and under saline irrigation the assistant performed upper endoscopy and with the gastric pouch tensely distended with air-there was no evidence of leak on this test. The pouch was desufflated. The Terance Hart defect was closed with running 2-0 silk. The abdomen was inspected for any evidence of bleeding or bowel injury and everything looked fine. The Nathanson retractor was removed under direct vision after coating the anastomosis with Eviceal tissue sealant. All CO2 was evacuated and trochars removed. Skin incisions were closed with 4-0 monocryl in a subcuticular  fashion followed by benzoin, steri-strips and bandages. Sponge needle and instrument counts were correct. The patient was taken to the PACU in good condition.    Leighton Ruff. Redmond Pulling, MD, FACS General, Bariatric, & Minimally Invasive Surgery [facility], [place]

## 2018-06-22 NOTE — Discharge Instructions (Signed)
° ° ° °GASTRIC BYPASS/SLEEVE ° Home Care Instructions ° ° These instructions are to help you care for yourself when you go home. ° °Call: If you have any problems. °• Call [phone] and ask for the surgeon on call °• If you need immediate help, come to the ER at [facility].  °• Tell the ER staff that you are a new post-op gastric bypass or gastric sleeve patient °  °Signs and symptoms to report: • Severe vomiting or nausea °o If you cannot keep down clear liquids for longer than 1 day, call your surgeon  °• Abdominal pain that does not get better after taking your pain medication °• Fever over 100.4° F with chills °• Heart beating over 100 beats a minute °• Shortness of breath at rest °• Chest pain °•  Redness, swelling, drainage, or foul odor at incision (surgical) sites °•  If your incisions open or pull apart °• Swelling or pain in calf (lower leg) °• Diarrhea (Loose bowel movements that happen often), frequent watery, uncontrolled bowel movements °• Constipation, (no bowel movements for 3 days) if this happens: Pick one °o Milk of Magnesia, 2 tablespoons by mouth, 3 times a day for 2 days if needed °o Stop taking Milk of Magnesia once you have a bowel movement °o Call your doctor if constipation continues °Or °o Miralax  (instead of Milk of Magnesia) following the label instructions °o Stop taking Miralax once you have a bowel movement °o Call your doctor if constipation continues °• Anything you think is not normal °  °Normal side effects after surgery: • Unable to sleep at night or unable to focus °• Irritability or moody °• Being tearful (crying) or depressed °These are common complaints, possibly related to your anesthesia medications that put you to sleep, stress of surgery, and change in lifestyle.  This usually goes away a few weeks after surgery.  If these feelings continue, call your primary care doctor. °  °Wound Care: You may have surgical glue, steri-strips, or staples over your incisions after  surgery °• Surgical glue:  Looks like a clear film over your incisions and will wear off a little at a time °• Steri-strips: Strips of tape over your incisions. You may notice a yellowish color on the skin under the steri-strips. This is used to make the   steri-strips stick better. Do not pull the steri-strips off - let them fall off °• Staples: Staples may be removed before you leave the hospital °o If you go home with staples, call [facility], [phone] at for an appointment with your surgeon’s nurse to have staples removed 10 days after surgery. °• Showering: You may shower two (2) days after your surgery unless your surgeon tells you differently °o Wash gently around incisions with warm soapy water, rinse well, and gently pat dry  °o No tub baths until staples are removed, steri-strips fall off or glue is gone.  °  °Medications: • Medications should be liquid or crushed if larger than the size of a dime °• Extended release pills (medication that release a little bit at a time through the day) should NOT be crushed or cut. (examples include XL, ER, DR, SR) °• Depending on the size and number of medications you take, you may need to space (take a few throughout the day)/change the time you take your medications so that you do not over-fill your pouch (smaller stomach) °• Make sure you follow-up with your primary care doctor to   make medication changes needed during rapid weight loss and life-style changes °• If you have diabetes, follow up with the doctor that orders your diabetes medication(s) within one week after surgery and check your blood sugar regularly. °• Do not drive while taking prescription pain medication  °• It is ok to take Tylenol by the bottle instructions with your pain medicine or instead of your pain medicine as needed.  DO NOT TAKE NSAIDS (EXAMPLES OF NSAIDS:  IBUPROFREN/ NAPROXEN)  °Diet:                    First 2 Weeks ° You will see the dietician t about two (2) weeks  after your surgery. The dietician will increase the types of foods you can eat if you are handling liquids well: °• If you have severe vomiting or nausea and cannot keep down clear liquids lasting longer than 1 day, call your surgeon @ ([phone]) °Protein Shake °• Drink at least 2 ounces of shake 5-6 times per day °• Each serving of protein shakes (usually 8 - 12 ounces) should have: °o 15 grams of protein  °o And no more than 5 grams of carbohydrate  °• Goal for protein each day: °o Men = 80 grams per day °o Women = 60 grams per day °• Protein powder may be added to fluids such as non-fat milk or Lactaid milk or unsweetened Soy/Almond milk (limit to 35 grams added protein powder per serving) ° °Hydration °• Slowly increase the amount of water and other clear liquids as tolerated (See Acceptable Fluids) °• Slowly increase the amount of protein shake as tolerated  °•  Sip fluids slowly and throughout the day.  Do not use straws. °• May use sugar substitutes in small amounts (no more than 6 - 8 packets per day; i.e. Splenda) ° °Fluid Goal °• The first goal is to drink at least 8 ounces of protein shake/drink per day (or as directed by the nutritionist); some examples of protein shakes are Syntrax Nectar, Adkins Advantage, EAS Edge HP, and Unjury. See handout from pre-op Bariatric Education Class: °o Slowly increase the amount of protein shake you drink as tolerated °o You may find it easier to slowly sip shakes throughout the day °o It is important to get your proteins in first °• Your fluid goal is to drink 64 - 100 ounces of fluid daily °o It may take a few weeks to build up to this °• 32 oz (or more) should be clear liquids  °And  °• 32 oz (or more) should be full liquids (see below for examples) °• Liquids should not contain sugar, caffeine, or carbonation ° °Clear Liquids: °• Water or Sugar-free flavored water (i.e. Fruit H2O, Propel) °• Decaffeinated coffee or tea (sugar-free) °• Crystal Lite, Wyler’s Lite,  Minute Maid Lite °• Sugar-free Jell-O °• Bouillon or broth °• Sugar-free Popsicle:   *Less than 20 calories each; Limit 1 per day ° °Full Liquids: °Protein Shakes/Drinks + 2 choices per day of other full liquids °• Full liquids must be: °o No More Than 15 grams of Carbs per serving  °o No More Than 3 grams of Fat per serving °• Strained low-fat cream soup (except Cream of Potato or Tomato) °• Non-Fat milk °• Fat-free Lactaid Milk °• Unsweetened Soy Or Unsweetened Almond Milk °• Low Sugar yogurt (Dannon Lite & Fit, Greek yogurt; Oikos Triple Zero; Chobani Simply 100; Yoplait 100 calorie Greek - No Fruit on the Bottom) ° °  °Vitamins   and Minerals • Start 1 day after surgery unless otherwise directed by your surgeon °• 2 Chewable Bariatric Specific Multivitamin / Multimineral Supplement with iron (Example: Bariatric Advantage Multi EA) °• Chewable Calcium with Vitamin D-3 °(Example: 3 Chewable Calcium Plus 600 with Vitamin D-3) °o Take 500 mg three (3) times a day for a total of 1500 mg each day °o Do not take all 3 doses of calcium at one time as it may cause constipation, and you can only absorb 500 mg  at a time  °o Do not mix multivitamins containing iron with calcium supplements; take 2 hours apart °• Menstruating women and those with a history of anemia (a blood disease that causes weakness) may need extra iron °o Talk with your doctor to see if you need more iron °• Do not stop taking or change any vitamins or minerals until you talk to your dietitian or surgeon °• Your Dietitian and/or surgeon must approve all vitamin and mineral supplements °  °Activity and Exercise: Limit your physical activity as instructed by your doctor.  It is important to continue walking at home.  During this time, use these guidelines: °• Do not lift anything greater than ten (10) pounds for at least two (2) weeks °• Do not go back to work or drive until your surgeon says you can °• You may have sex when you feel comfortable  °o It is  VERY important for female patients to use a reliable birth control method; fertility often increases after surgery  °o All hormonal birth control will be ineffective for 30 days after surgery due to medications given during surgery a barrier method must be used. °o Do not get pregnant for at least 18 months °• Start exercising as soon as your doctor tells you that you can °o Make sure your doctor approves any physical activity °• Start with a simple walking program °• Walk 5-15 minutes each day, 7 days per week.  °• Slowly increase until you are walking 30-45 minutes per day °Consider joining our BELT program. [phone] or email [email or web address] °  °Special Instructions Things to remember: °• Use your CPAP when sleeping if this applies to you ° °• [facility] has two free Bariatric Surgery Support Groups that meet monthly °o The 3rd Thursday of each month, 6 pm, [facility]  °o The 2nd Friday of each month, 11:45 am in the private dining room in the basement of [facility] °• It is very important to keep all follow up appointments with your surgeon, dietitian, primary care physician, and behavioral health practitioner °• Routine follow up schedule with your surgeon include appointments at 2-3 weeks, 6-8 weeks, 6 months, and 1 year at a minimum.  Your surgeon may request to see you more often.   °o After the first year, please follow up with your bariatric surgeon and dietitian at least once a year in order to maintain best weight loss results °[facility]: [phone] °[facility]: [phone] °Bariatric Nurse Coordinator: [phone] °  °   Reviewed and Endorsed  °by [facility] Patient Education Committee, June, 2016 °Edits Approved: Aug, 2018 ° ° ° °

## 2018-06-22 NOTE — Anesthesia Procedure Notes (Signed)

## 2018-06-23 ENCOUNTER — Encounter (HOSPITAL_COMMUNITY): Payer: Self-pay | Admitting: General Surgery

## 2018-06-23 LAB — CBC WITH DIFFERENTIAL/PLATELET
ABS IMMATURE GRANULOCYTES: 0.11 10*3/uL — AB (ref 0.00–0.07)
BASOS PCT: 0 %
Basophils Absolute: 0 10*3/uL (ref 0.0–0.1)
Eosinophils Absolute: 0 10*3/uL (ref 0.0–0.5)
Eosinophils Relative: 0 %
HCT: 38.5 % (ref 36.0–46.0)
Hemoglobin: 11.6 g/dL — ABNORMAL LOW (ref 12.0–15.0)
IMMATURE GRANULOCYTES: 1 %
Lymphocytes Relative: 11 %
Lymphs Abs: 1.7 10*3/uL (ref 0.7–4.0)
MCH: 23.2 pg — ABNORMAL LOW (ref 26.0–34.0)
MCHC: 30.1 g/dL (ref 30.0–36.0)
MCV: 77 fL — ABNORMAL LOW (ref 80.0–100.0)
Monocytes Absolute: 1.7 10*3/uL — ABNORMAL HIGH (ref 0.1–1.0)
Monocytes Relative: 11 %
NEUTROS ABS: 11.8 10*3/uL — AB (ref 1.7–7.7)
NEUTROS PCT: 77 %
PLATELETS: 236 10*3/uL (ref 150–400)
RBC: 5 MIL/uL (ref 3.87–5.11)
RDW: 15.1 % (ref 11.5–15.5)
WBC: 15.4 10*3/uL — AB (ref 4.0–10.5)
nRBC: 0 % (ref 0.0–0.2)

## 2018-06-23 LAB — COMPREHENSIVE METABOLIC PANEL
ALT: 79 U/L — AB (ref 0–44)
AST: 63 U/L — AB (ref 15–41)
Albumin: 3.1 g/dL — ABNORMAL LOW (ref 3.5–5.0)
Alkaline Phosphatase: 40 U/L (ref 38–126)
Anion gap: 9 (ref 5–15)
BUN: 12 mg/dL (ref 6–20)
CHLORIDE: 105 mmol/L (ref 98–111)
CO2: 24 mmol/L (ref 22–32)
CREATININE: 0.82 mg/dL (ref 0.44–1.00)
Calcium: 9.1 mg/dL (ref 8.9–10.3)
GFR calc non Af Amer: >60 mL/min (ref >60)
Glucose, Bld: 128 mg/dL — ABNORMAL HIGH (ref 70–99)
Potassium: 3.7 mmol/L (ref 3.5–5.1)
SODIUM: 138 mmol/L (ref 135–145)
Total Bilirubin: 0.6 mg/dL (ref 0.3–1.2)
Total Protein: 6.1 g/dL — ABNORMAL LOW (ref 6.5–8.1)

## 2018-06-23 MED ORDER — TRAMADOL HCL 50 MG PO TABS: 50.0000 mg | ORAL_TABLET | Freq: Four times a day (QID) | ORAL | 0 refills | Status: DC | PRN

## 2018-06-23 MED ORDER — ONDANSETRON 4 MG PO TBDP: 4.0000 mg | ORAL_TABLET | Freq: Four times a day (QID) | ORAL | 0 refills | Status: DC | PRN

## 2018-06-23 MED ORDER — NEBIVOLOL HCL 5 MG PO TABS: 5.0000 mg | ORAL_TABLET | Freq: Every day | ORAL | 0 refills | Status: DC

## 2018-06-23 MED ORDER — PANTOPRAZOLE SODIUM 40 MG PO TBEC: 40.0000 mg | DELAYED_RELEASE_TABLET | Freq: Every day | ORAL | 0 refills | Status: DC

## 2018-06-23 NOTE — Plan of Care (Signed)

## 2018-06-23 NOTE — Discharge Summary (Signed)
Physician Discharge Summary  Mary Sanford SWF:[id number] DOB: 11-Jul-1966 DOA: 06/22/2018  PCP: [name], MD  Admit date: 06/22/2018 Discharge date: 06/23/18  Recommendations for Outpatient Follow-up:    Follow-up Information    Greer Pickerel, MD. Go on 07/21/2018.   Specialty:  General Surgery Why:  at [address] information: [address] [place] [address] [phone]        Carlena Hurl, PA-C. Go on 08/05/2018.   Specialty:  General Surgery Why:  at [address] Contact information: [place] [address] [phone]        [name], MD. Schedule an appointment as soon as possible for a visit in 4 week(s).   Specialty:  Internal Medicine Why:  for BP recheck Contact information: [address] [address] [phone]          Discharge Diagnoses:  Principal Problem:   Obesity (BMI 30-39.9) Active Problems:   Essential hypertension, benign   Prediabetes   Chronic low back pain   Severe obesity ([place])   Surgical Procedure: Laparoscopic Roux-en-Y gastric bypass, upper endoscopy  Discharge Condition: Good Disposition: Home  Diet recommendation: Postoperative gastric bypass diet  Filed Weights   06/22/18 0545  Weight: 112.2 kg     Hospital Course:  The patient was admitted for a planned laparoscopic Roux-en-Y gastric bypass. Please see operative note. Preoperatively the patient was given 5000 units of subcutaneous heparin for DVT prophylaxis. ERAS protocol was used. Postoperative prophylactic Lovenox dosing was started on the evening of postoperative day 0.  The patient was started on ice chips and water on the evening of POD 0 which they tolerated. On postoperative day 1 The patient's diet was advanced to protein shakes which they also tolerated. On POD 1, The patient was ambulating without difficulty. Their vital signs are stable without fever or tachycardia. Their hemoglobin had  remained stable.  The patient had received discharge instructions and counseling. They were deemed stable for discharge.  BP 117/62 (BP Location: Right Arm)   Pulse 72   Temp 98.1 F (36.7 C) (Oral)   Resp 18   Ht 5\' 9"  (1.753 m)   Wt 112.2 kg   SpO2 95%   BMI 36.53 kg/m   Gen: alert, NAD, non-toxic appearing Pupils: equal, no scleral icterus Pulm: Lungs clear to auscultation, symmetric chest rise CV: regular rate and rhythm Abd: soft, min tender, nondistended. No cellulitis. No incisional hernia Ext: no edema, no calf tenderness Skin: no rash, no jaundice  Discharge Instructions  Discharge Instructions    Ambulate hourly while awake   Complete by:  As directed    Call MD for:  difficulty breathing, headache or visual disturbances   Complete by:  As directed    Call MD for:  persistant dizziness or light-headedness   Complete by:  As directed    Call MD for:  persistant nausea and vomiting   Complete by:  As directed    Call MD for:  redness, tenderness, or signs of infection (pain, swelling, redness, odor or green/yellow discharge around incision site)   Complete by:  As directed    Call MD for:  severe uncontrolled pain   Complete by:  As directed    Call MD for:  temperature >101 F   Complete by:  As directed    Diet bariatric full liquid   Complete by:  As directed    Discharge instructions   Complete  by:  As directed    See bariatric discharge instructions   Incentive spirometry   Complete by:  As directed    Perform hourly while awake     Allergies as of 06/23/2018      Reactions   Naproxen Other (See Comments)   Panic attacks      Medication List    STOP taking these medications   benzonatate 100 MG capsule Commonly known as:  TESSALON   dicyclomine 20 MG tablet Commonly known as:  BENTYL   fluticasone 50 MCG/ACT nasal spray Commonly known as:  FLONASE   phentermine 37.5 MG tablet Commonly known as:  ADIPEX-P     TAKE these medications    acetaminophen 500 MG tablet Commonly known as:  TYLENOL Take 1,000 mg by mouth every 6 (six) hours as needed for mild pain.   albuterol 108 (90 Base) MCG/ACT inhaler Commonly known as:  PROVENTIL HFA;VENTOLIN HFA Inhale 1-2 puffs into the lungs every 6 (six) hours as needed for wheezing or shortness of breath.   escitalopram 10 MG tablet Commonly known as:  LEXAPRO Take 1 tablet (10 mg total) by mouth daily.   hydrochlorothiazide 25 MG tablet Commonly known as:  HYDRODIURIL TAKE 1 TABLET BY MOUTH ONCE DAILY Notes to patient:  Monitor Blood Pressure Daily and keep a log for primary care physician.  Monitor for symptoms of dehydration.  You may need to make changes to your medications with rapid weight loss.     metoCLOPramide 10 MG tablet Commonly known as:  REGLAN Take 1 tablet (10 mg total) by mouth every 6 (six) hours as needed for nausea.   nebivolol 5 MG tablet Commonly known as:  BYSTOLIC Take 1 tablet (5 mg total) by mouth daily.   ondansetron 4 MG disintegrating tablet Commonly known as:  ZOFRAN-ODT Take 1 tablet (4 mg total) by mouth every 6 (six) hours as needed for nausea or vomiting.   pantoprazole 40 MG tablet Commonly known as:  PROTONIX Take 1 tablet (40 mg total) by mouth daily.   potassium chloride 10 MEQ tablet Commonly known as:  K-DUR,KLOR-CON Take 1 tablet (10 mEq total) by mouth 2 (two) times daily. What changed:  when to take this   traMADol 50 MG tablet Commonly known as:  ULTRAM Take 1 tablet (50 mg total) by mouth every 6 (six) hours as needed for severe pain.      Follow-up Information    Greer Pickerel, MD. Go on 07/21/2018.   Specialty:  General Surgery Why:  at [address] information: [address] [place] [address] [phone]        Carlena Hurl, PA-C. Go on 08/05/2018.   Specialty:  General Surgery Why:  at [address] Contact information: [place] [address] [phone]        [name], MD. Schedule an appointment as soon as possible for a visit in 4 week(s).   Specialty:  Internal Medicine Why:  for BP recheck Contact information: [address] [address] [phone]            The results of significant diagnostics from this hospitalization (including imaging, microbiology, ancillary and laboratory) are listed below for reference.    Significant Diagnostic Studies: No results found.  Labs: Basic Metabolic Panel: Recent Labs  Lab 06/23/18 0508  NA 138  K 3.7  CL 105  CO2 24  GLUCOSE 128*  BUN 12  CREATININE 0.82  CALCIUM 9.1   Liver Function Tests: Recent Labs  Lab 06/23/18 0508  AST 63*  ALT 79*  ALKPHOS 40  BILITOT 0.6  PROT 6.1*  ALBUMIN 3.1*    CBC: Recent Labs  Lab 06/22/18 1106 06/23/18 0508  WBC  --  15.4*  NEUTROABS  --  11.8*  HGB 13.5 11.6*  HCT 44.3 38.5  MCV  --  77.0*  PLT  --  236    CBG: No results for input(s): GLUCAP in the last 168 hours.  Principal Problem:   Obesity (BMI 30-39.9) Active Problems:   Essential hypertension, benign   Prediabetes   Chronic low back pain   Severe obesity ([place])   Time coordinating discharge: 15 min  Signed:  Gayland Curry, MD [facility], [place] 06/23/2018, 8:53 AM

## 2018-06-23 NOTE — Progress Notes (Signed)
Patient alert and oriented, pain is controlled. Patient is tolerating fluids, advanced to protein shake today, patient is tolerating well. Reviewed Gastric Bypass discharge instructions with patient and patient is able to articulate understanding. Provided information on BELT program, Support Group and [facility] outpatient pharmacy. All questions answered, will continue to monitor.   Total fluid intake 660 Per dehydration protocol call back on week post op

## 2018-06-23 NOTE — Progress Notes (Signed)
Discharge instructions given to pt and all questions were answered. Pt taken down via wheelchair and was picked up by her husband.  

## 2018-06-27 ENCOUNTER — Emergency Department (HOSPITAL_COMMUNITY): Payer: Federal, State, Local not specified - PPO

## 2018-06-27 ENCOUNTER — Encounter (HOSPITAL_COMMUNITY): Payer: Self-pay | Admitting: Emergency Medicine

## 2018-06-27 ENCOUNTER — Other Ambulatory Visit: Payer: Self-pay

## 2018-06-27 ENCOUNTER — Observation Stay (HOSPITAL_COMMUNITY)
Admission: EM | Admit: 2018-06-27 | Discharge: 2018-06-29 | Disposition: A | Discharge status: Home / Self Care | Payer: Federal, State, Local not specified - PPO | Attending: General Surgery | Admitting: General Surgery

## 2018-06-27 DIAGNOSIS — E876 Hypokalemia: Secondary | ICD-10-CM | POA: Diagnosis not present

## 2018-06-27 DIAGNOSIS — R42 Dizziness and giddiness: Secondary | ICD-10-CM | POA: Diagnosis not present

## 2018-06-27 DIAGNOSIS — Z79899 Other long term (current) drug therapy: Secondary | ICD-10-CM | POA: Insufficient documentation

## 2018-06-27 DIAGNOSIS — E860 Dehydration: Secondary | ICD-10-CM | POA: Insufficient documentation

## 2018-06-27 DIAGNOSIS — M1990 Unspecified osteoarthritis, unspecified site: Secondary | ICD-10-CM | POA: Insufficient documentation

## 2018-06-27 DIAGNOSIS — Z9884 Bariatric surgery status: Secondary | ICD-10-CM

## 2018-06-27 DIAGNOSIS — R079 Chest pain, unspecified: Secondary | ICD-10-CM | POA: Diagnosis not present

## 2018-06-27 DIAGNOSIS — F329 Major depressive disorder, single episode, unspecified: Secondary | ICD-10-CM | POA: Insufficient documentation

## 2018-06-27 DIAGNOSIS — Z87442 Personal history of urinary calculi: Secondary | ICD-10-CM | POA: Diagnosis not present

## 2018-06-27 DIAGNOSIS — R112 Nausea with vomiting, unspecified: Secondary | ICD-10-CM | POA: Diagnosis not present

## 2018-06-27 DIAGNOSIS — R1013 Epigastric pain: Secondary | ICD-10-CM | POA: Diagnosis not present

## 2018-06-27 DIAGNOSIS — N132 Hydronephrosis with renal and ureteral calculous obstruction: Principal | ICD-10-CM | POA: Insufficient documentation

## 2018-06-27 DIAGNOSIS — Z87891 Personal history of nicotine dependence: Secondary | ICD-10-CM | POA: Diagnosis not present

## 2018-06-27 DIAGNOSIS — F419 Anxiety disorder, unspecified: Secondary | ICD-10-CM | POA: Diagnosis not present

## 2018-06-27 DIAGNOSIS — R197 Diarrhea, unspecified: Secondary | ICD-10-CM | POA: Diagnosis not present

## 2018-06-27 DIAGNOSIS — R1110 Vomiting, unspecified: Secondary | ICD-10-CM | POA: Diagnosis not present

## 2018-06-27 DIAGNOSIS — I10 Essential (primary) hypertension: Secondary | ICD-10-CM | POA: Insufficient documentation

## 2018-06-27 LAB — URINALYSIS, ROUTINE W REFLEX MICROSCOPIC
Bacteria, UA: NONE SEEN
Bilirubin Urine: NEGATIVE
GLUCOSE, UA: NEGATIVE mg/dL
Hgb urine dipstick: NEGATIVE
Ketones, ur: 20 mg/dL — AB
Nitrite: NEGATIVE
PROTEIN: NEGATIVE mg/dL
pH: 6 (ref 5.0–8.0)

## 2018-06-27 LAB — COMPREHENSIVE METABOLIC PANEL
ALK PHOS: 56 U/L (ref 38–126)
ALT: 90 U/L — AB (ref 0–44)
AST: 44 U/L — ABNORMAL HIGH (ref 15–41)
Albumin: 3.6 g/dL (ref 3.5–5.0)
Anion gap: 12 (ref 5–15)
BILIRUBIN TOTAL: 0.8 mg/dL (ref 0.3–1.2)
BUN: 20 mg/dL (ref 6–20)
CALCIUM: 9.9 mg/dL (ref 8.9–10.3)
CO2: 26 mmol/L (ref 22–32)
CREATININE: 1.03 mg/dL — AB (ref 0.44–1.00)
Chloride: 100 mmol/L (ref 98–111)
Glucose, Bld: 95 mg/dL (ref 70–99)
Potassium: 3 mmol/L — ABNORMAL LOW (ref 3.5–5.1)
Sodium: 138 mmol/L (ref 135–145)
TOTAL PROTEIN: 7.5 g/dL (ref 6.5–8.1)

## 2018-06-27 LAB — I-STAT TROPONIN, ED: Troponin i, poc: 0 ng/mL (ref 0.00–0.08)

## 2018-06-27 LAB — CBC WITH DIFFERENTIAL/PLATELET
Abs Immature Granulocytes: 0.05 10*3/uL (ref 0.00–0.07)
BASOS PCT: 1 %
Basophils Absolute: 0 10*3/uL (ref 0.0–0.1)
EOS PCT: 2 %
Eosinophils Absolute: 0.2 10*3/uL (ref 0.0–0.5)
HCT: 44.6 % (ref 36.0–46.0)
Hemoglobin: 14 g/dL (ref 12.0–15.0)
Immature Granulocytes: 1 %
Lymphocytes Relative: 25 %
Lymphs Abs: 2 10*3/uL (ref 0.7–4.0)
MCH: 23.4 pg — AB (ref 26.0–34.0)
MCHC: 31.4 g/dL (ref 30.0–36.0)
MCV: 74.5 fL — ABNORMAL LOW (ref 80.0–100.0)
MONO ABS: 1 10*3/uL (ref 0.1–1.0)
Monocytes Relative: 12 %
NRBC: 0 % (ref 0.0–0.2)
Neutro Abs: 4.9 10*3/uL (ref 1.7–7.7)
Neutrophils Relative %: 59 %
PLATELETS: 301 10*3/uL (ref 150–400)
RBC: 5.99 MIL/uL — AB (ref 3.87–5.11)
RDW: 14.6 % (ref 11.5–15.5)
WBC: 8.2 10*3/uL (ref 4.0–10.5)

## 2018-06-27 LAB — I-STAT CG4 LACTIC ACID, ED: LACTIC ACID, VENOUS: 1.12 mmol/L (ref 0.5–1.9)

## 2018-06-27 LAB — LIPASE, BLOOD: Lipase: 174 U/L — ABNORMAL HIGH (ref 11–51)

## 2018-06-27 MED ORDER — PROMETHAZINE HCL 25 MG/ML IJ SOLN
12.5000 mg | Freq: Once | INTRAMUSCULAR | Status: AC
  Administered 2018-06-27: 12.5 mg via INTRAVENOUS
  Filled 2018-06-27: qty 1

## 2018-06-27 MED ORDER — THIAMINE HCL 100 MG/ML IJ SOLN
Freq: Once | INTRAVENOUS | Status: AC
  Administered 2018-06-27: 22:00:00 via INTRAVENOUS
  Filled 2018-06-27: qty 1000

## 2018-06-27 MED ORDER — MORPHINE SULFATE (PF) 4 MG/ML IV SOLN
4.0000 mg | Freq: Once | INTRAVENOUS | Status: AC
  Administered 2018-06-27: 4 mg via INTRAVENOUS
  Filled 2018-06-27: qty 1

## 2018-06-27 MED ORDER — IOPAMIDOL (ISOVUE-300) INJECTION 61%
INTRAVENOUS | Status: AC
  Administered 2018-06-27: 100 mL
  Filled 2018-06-27: qty 100

## 2018-06-27 MED ORDER — SODIUM CHLORIDE 0.9 % IV SOLN
Freq: Once | INTRAVENOUS | Status: AC
  Administered 2018-06-27: 18:00:00 via INTRAVENOUS

## 2018-06-27 MED ORDER — SODIUM CHLORIDE 0.9 % IV BOLUS
1000.0000 mL | Freq: Once | INTRAVENOUS | Status: AC
  Administered 2018-06-27: 1000 mL via INTRAVENOUS

## 2018-06-27 MED ORDER — POTASSIUM CHLORIDE 10 MEQ/100ML IV SOLN
10.0000 meq | INTRAVENOUS | Status: AC
  Administered 2018-06-27 (×2): 10 meq via INTRAVENOUS
  Filled 2018-06-27: qty 100

## 2018-06-27 MED ORDER — SODIUM CHLORIDE (PF) 0.9 % IJ SOLN
INTRAMUSCULAR | Status: AC
  Administered 2018-06-28
  Filled 2018-06-27: qty 50

## 2018-06-27 NOTE — ED Provider Notes (Signed)
[facility] DEPT Provider Note   CSN: [id number] Arrival date & time: 06/27/18  1553     History   Chief Complaint Chief Complaint  Patient presents with  . Abdominal Pain  . Emesis    HPI Mary Sanford is a 52 y.o. female with a history of nephrolithiasis, HTN, obesity, anxiety, and B12 deficiency who presents to the emergency department with a chief complaint of vomiting.  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  She reports constant epigastric abdominal pain since the procedure. She reports vomiting, onset today. She has had non-bloody, non-bilious emesis every time she has attempted to drink liquids with nausea and one episode of diarrhea. She states she is feeling generally weak, with dizziness and lightheadedness, and feels dehydrated.   She developed constant chest pain, described as "pressure" with a cough 2 days ago. She states she also had some numbness in her left shoulder yesterday that has since resolved.  States that she has intermittent cough so hard she had an episode of posttussive emesis.  She has been compliant with her post-surgery liquid diet of Premier shakes and broth. She has not consumed any solid foods.  She denies fevers, chills, dyspnea, dysuria, hematuria, vaginal pain or discharge, back pain, headache, visual changes, melena, hematochezia, hematemesis, or URI symptoms. No treatment prior to arrival.   She does not take OCPs. Reports her sister previously had a PE. She is unsure if it was provoked or unprovoked. She wores SCDs during her one-day hospital stay following her surgery.   The history is provided by the patient. No language interpreter was used.    Past Medical History:  Diagnosis Date  . Allergy   . Anemia   . Anxiety   . Arthritis    arm/ wrist areas  . B12 deficiency   . Constipation    not chronic- now stools soft and regular   . Cough    non- productive  . Depression    no  meds  . History of kidney stones   . Hypertension   . Kidney stones     Patient Active Problem List   Diagnosis Date Noted  . Nausea & vomiting 06/27/2018  . Chronic low back pain 06/22/2018  . Severe obesity ([place]) 06/22/2018  . RTI (respiratory tract infection) 02/10/2018  . ASCUS pap with negative high risk HPV on 06/30/16 06/30/2016  . Depression with anxiety 12/28/2015  . De Quervain's tenosynovitis, left 06/21/2015  . Prediabetes 11/02/2014  . Anemia, iron deficiency 09/17/2013  . Routine general medical examination at a health care facility 01/12/2013  . Allergic rhinitis 01/12/2013  . Constipation, chronic 10/04/2012  . Visit for screening mammogram 01/12/2012  . B12 deficiency anemia 01/09/2012  . Obesity (BMI 30-39.9) 01/09/2012  . Essential hypertension, benign 11/24/2011    Past Surgical History:  Procedure Laterality Date  . BUNIONECTOMY Bilateral   . caesarean section    . cystoscopic     extraction of ureteric calculus without disintegration  . DILITATION & CURRETTAGE/HYSTROSCOPY WITH NOVASURE ABLATION N/A 06/17/2013   Procedure: DILATATION & CURETTAGE/HYSTEROSCOPY WITH NOVASURE ABLATION;  Surgeon: Osborne Oman, MD;  Location: [facility];  Service: Gynecology;  Laterality: N/A;  . 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 HEMORRHOIDECTOMY WITH HEMORRHOID BANDING  10/19/2012   [facility] Hem ligation/pexy  . TUBAL LIGATION    .  WISDOM TOOTH EXTRACTION       OB History    Gravida  7   Para  5   Term  4   Preterm  1   AB  2   Living  5     SAB  0   TAB  2   Ectopic  0   Multiple  0   Live Births  5            Home Medications    Prior to Admission medications   Medication Sig Start Date End Date Taking? Authorizing Provider  acetaminophen (TYLENOL) 500 MG tablet Take 1,000 mg by mouth  every 6 (six) hours as needed for mild pain.    Yes [provider]  albuterol (PROVENTIL HFA;VENTOLIN HFA) 108 (90 Base) MCG/ACT inhaler Inhale 1-2 puffs into the lungs every 6 (six) hours as needed for wheezing or shortness of breath. 10/24/16  Yes Nche, Charlene Brooke, NP  escitalopram (LEXAPRO) 10 MG tablet Take 1 tablet (10 mg total) by mouth daily. 02/10/18  Yes [name], MD  hydrochlorothiazide (HYDRODIURIL) 25 MG tablet TAKE 1 TABLET BY MOUTH ONCE DAILY Patient taking differently: Take 25 mg by mouth daily.  06/07/18  Yes [name], MD  nebivolol (BYSTOLIC) 5 MG tablet Take 1 tablet (5 mg total) by mouth daily. 06/23/18  Yes Greer Pickerel, MD  ondansetron (ZOFRAN-ODT) 4 MG disintegrating tablet Take 1 tablet (4 mg total) by mouth every 6 (six) hours as needed for nausea or vomiting. 06/23/18  Yes Greer Pickerel, MD  potassium chloride (K-DUR,KLOR-CON) 10 MEQ tablet Take 1 tablet (10 mEq total) by mouth 2 (two) times daily. Patient taking differently: Take 10 mEq by mouth daily.  07/23/17  Yes [name], MD  traMADol (ULTRAM) 50 MG tablet Take 1 tablet (50 mg total) by mouth every 6 (six) hours as needed for severe pain. 06/23/18  Yes Greer Pickerel, MD  metoCLOPramide (REGLAN) 10 MG tablet Take 1 tablet (10 mg total) by mouth every 6 (six) hours as needed for nausea. Patient not taking: Reported on 06/27/2018 05/17/18   Julianne Rice, MD  pantoprazole (PROTONIX) 40 MG tablet Take 1 tablet (40 mg total) by mouth daily. Patient not taking: Reported on 06/27/2018 06/23/18   Greer Pickerel, MD  potassium chloride (K-DUR) 10 MEQ tablet Take 1 tablet (10 mEq total) by mouth 2 (two) times daily. 05/13/12 06/27/13  [name], MD    Family History Family History  Problem Relation Age of Onset  . Hypertension Mother   . Hypothyroidism Mother   . Asthma Mother   . Cancer Mother 63       Colon and rectal  . Rectal cancer Mother 24  . Colon cancer Mother 17  .  Hypotension Sister   . Hypothyroidism Sister   . Diabetes Sister   . Seizures Brother   . Hypothyroidism Sister   . Seizures Sister   . Anemia Sister   . Asthma Unknown   . Hypothyroidism Unknown   . Heart disease Paternal Grandfather        >55  . Stroke Maternal Uncle        > 55  . Deep vein thrombosis Sister   . Early death Neg Hx   . Hyperlipidemia Neg Hx   . Kidney disease Neg Hx   . Colon polyps Neg Hx   . Esophageal cancer Neg Hx   . Stomach cancer Neg Hx     Social  History Social History   Tobacco Use  . Smoking status: Former Smoker    Years: 5.00    Types: Cigarettes    Last attempt to quit: 08/05/1999    Years since quitting: 18.9  . Smokeless tobacco: Never Used  . Tobacco comment: 2-5 cigarettes per day  Substance Use Topics  . Alcohol use: Yes    Comment: socially  . Drug use: No     Allergies   Naproxen   Review of Systems Review of Systems  Constitutional: Negative for activity change, chills and fever.  HENT: Negative for congestion and sinus pain.   Respiratory: Positive for cough. Negative for shortness of breath.   Cardiovascular: Positive for chest pain.  Gastrointestinal: Positive for abdominal pain, diarrhea, nausea and vomiting.  Genitourinary: Negative for decreased urine volume, dysuria, frequency, hematuria and urgency.  Musculoskeletal: Negative for back pain and myalgias.  Skin: Negative for rash.  Allergic/Immunologic: Negative for immunocompromised state.  Neurological: Positive for dizziness, weakness (generalized weakness) and light-headedness. Negative for headaches.  Psychiatric/Behavioral: Negative for confusion.     Physical Exam Updated Vital Signs BP (!) 123/93 (BP Location: Right Arm)   Pulse 75   Temp 97.9 F (36.6 C) (Oral)   Resp 16   SpO2 99%   Physical Exam  Constitutional: No distress.  HENT:  Head: Normocephalic.  Eyes: Pupils are equal, round, and reactive to light. Conjunctivae are normal. No  scleral icterus.  Neck: Neck supple.  Cardiovascular: Normal rate, regular rhythm, normal heart sounds and intact distal pulses. Exam reveals no gallop and no friction rub.  No murmur heard. Pulmonary/Chest: Effort normal. No stridor. No respiratory distress. She has no wheezes. She has no rales. She exhibits no tenderness.  Abdominal: Soft. She exhibits no distension and no mass. There is tenderness. There is guarding. There is no rebound. No hernia.  Diffusely tender to palpation throughout the abdomen with maximal tenderness over the epigastric area with guarding.  Abdomen is obese, but soft and nondistended.  No CVA tenderness bilaterally.  Normoactive bowel sounds in all 4 quadrants.  Neurological: She is alert.  Skin: Skin is warm. No rash noted. She is not diaphoretic.  Psychiatric: Her behavior is normal.  Nursing note and vitals reviewed.    ED Treatments / Results  Labs (all labs ordered are listed, but only abnormal results are displayed) Labs Reviewed  LIPASE, BLOOD - Abnormal; Notable for the following components:      Result Value   Lipase 174 (*)    All other components within normal limits  COMPREHENSIVE METABOLIC PANEL - Abnormal; Notable for the following components:   Potassium 3.0 (*)    Creatinine, Ser 1.03 (*)    AST 44 (*)    ALT 90 (*)    All other components within normal limits  URINALYSIS, ROUTINE W REFLEX MICROSCOPIC - Abnormal; Notable for the following components:   Specific Gravity, Urine >1.046 (*)    Ketones, ur 20 (*)    Leukocytes, UA TRACE (*)    All other components within normal limits  CBC WITH DIFFERENTIAL/PLATELET - Abnormal; Notable for the following components:   RBC 5.99 (*)    MCV 74.5 (*)    MCH 23.4 (*)    All other components within normal limits  COMPREHENSIVE METABOLIC PANEL  MAGNESIUM  LIPASE, BLOOD  I-STAT TROPONIN, ED  I-STAT CG4 LACTIC ACID, ED    EKG EKG Interpretation  Date/Time:  Sunday June 27 2018 17:38:42  EST Ventricular Rate:  80 PR Interval:    QRS Duration: 92 QT Interval:  382 QTC Calculation: 441 R Axis:   23 Text Interpretation:  Sinus rhythm Borderline T abnormalities, diffuse leads Baseline wander in lead(s) V6 No significant change since last tracing Confirmed by Deno Etienne [phone]) on 06/27/2018 8:56:47 PM   Radiology Dg Chest 2 View  Result Date: 06/27/2018 CLINICAL DATA:  Daily vomiting and diarrhea after recent gastric bypass surgery. EXAM: CHEST - 2 VIEW COMPARISON:  Chest radiograph May 20, 2018 FINDINGS: Cardiomediastinal silhouette is normal. No pleural effusions or focal consolidations. Trachea projects midline and there is no pneumothorax. Soft tissue planes and included osseous structures are non-suspicious. IMPRESSION: Normal chest radiograph. Electronically Signed   By: Elon Alas M.D.   On: 06/27/2018 18:05   Ct Abdomen Pelvis W Contrast  Result Date: 06/27/2018 CLINICAL DATA:  52 year old female with history of a gastric bypass on 06/22/2018. Daily vomiting since the time of surgery. Diarrhea and abdominal cramping today. EXAM: CT ABDOMEN AND PELVIS WITH CONTRAST TECHNIQUE: Multidetector CT imaging of the abdomen and pelvis was performed using the standard protocol following bolus administration of intravenous contrast. CONTRAST:  110mL ISOVUE-300 IOPAMIDOL (ISOVUE-300) INJECTION 61% COMPARISON:  CT the abdomen and pelvis 05/13/2018. FINDINGS: Lower chest: Unremarkable. Hepatobiliary: No cystic or solid hepatic lesions. No intra or extrahepatic biliary ductal dilatation. Gallbladder is normal in appearance. Pancreas: No pancreatic mass. No pancreatic ductal dilatation. No pancreatic or peripancreatic fluid or inflammatory changes. Spleen: Unremarkable. Adrenals/Urinary Tract: Multiple nonobstructive calculi are noted within the left renal collecting system measuring 3-4 mm in size. In addition, at the left ureteropelvic junction there is a cluster of 3 small  calculi measuring 2-3 mm in size best appreciated on coronal image 64 of series 6, with mild proximal hydronephrosis. Right kidney and bilateral adrenal glands are normal in appearance. No right-sided hydroureteronephrosis. Urinary bladder is normal in appearance. Stomach/Bowel: Status post Roux-en-Y gastric bypass. No pathologic dilatation of small bowel or colon. Normal appendix. Vascular/Lymphatic: Mild atherosclerotic calcifications noted throughout the pelvic vasculature. No lymphadenopathy noted in the abdomen or pelvis. Reproductive: Uterus is heterogeneous in appearance suggesting the presence of multiple small fibroids. Ovaries are unremarkable in appearance. Other: No significant volume of ascites.  No pneumoperitoneum. Musculoskeletal: There are no aggressive appearing lytic or blastic lesions noted in the visualized portions of the skeleton. IMPRESSION: 1. There is a cluster of 3 small 2-3 mm calculi at the left ureteropelvic junction with mild proximal hydronephrosis indicative of mild obstruction at this time. 2. Multiple additional nonobstructive calculi are noted in the collecting system of the left kidney measuring 2-3 mm in size. 3. Status post Roux-en-Y gastric bypass. Electronically Signed   By: Vinnie Langton M.D.   On: 06/27/2018 20:22    Procedures Procedures (including critical care time)  Medications Ordered in ED Medications  nebivolol (BYSTOLIC) tablet 5 mg (has no administration in time range)  escitalopram (LEXAPRO) tablet 10 mg (has no administration in time range)  enoxaparin (LOVENOX) injection 30 mg (has no administration in time range)  dextrose 5 % and 0.45 % NaCl with KCl 20 mEq/L infusion (has no administration in time range)  morphine 2 MG/ML injection 1-3 mg (2 mg Intravenous Given 06/28/18 0112)  acetaminophen (TYLENOL) solution 650 mg (has no administration in time range)  oxyCODONE (ROXICODONE) 5 MG/5ML solution 5-10 mg (has no administration in time range)    ondansetron (ZOFRAN) injection 4 mg (4 mg Intravenous Given 06/28/18 0111)  simethicone (MYLICON) chewable tablet 80 mg (  has no administration in time range)  pantoprazole (PROTONIX) injection 40 mg (has no administration in time range)  sucralfate (CARAFATE) 1 GM/10ML suspension 1 g (has no administration in time range)  promethazine (PHENERGAN) injection 12.5 mg (has no administration in time range)  diphenhydrAMINE (BENADRYL) injection 12.5 mg (has no administration in time range)  albuterol (PROVENTIL) (2.5 MG/3ML) 0.083% nebulizer solution 2.5 mg (has no administration in time range)  sodium chloride 0.9 % bolus 1,000 mL (0 mLs Intravenous Stopped 06/27/18 1822)  promethazine (PHENERGAN) injection 12.5 mg (12.5 mg Intravenous Given 06/27/18 1721)  morphine 4 MG/ML injection 4 mg (4 mg Intravenous Given 06/27/18 1721)  potassium chloride 10 mEq in 100 mL IVPB (0 mEq Intravenous Stopped 06/27/18 2329)  0.9 %  sodium chloride infusion ( Intravenous Stopped 06/27/18 2330)  sodium chloride 0.9 % 1,000 mL with thiamine 413 mg, folic acid 1 mg, multivitamins adult 10 mL infusion ( Intravenous New Bag/Given 06/27/18 2203)  iopamidol (ISOVUE-300) 61 % injection (100 mLs  Contrast Given 06/27/18 1931)  sodium chloride (PF) 0.9 % injection (  Given by Other 06/28/18 0004)     Initial Impression / Assessment and Plan / ED Course  I have reviewed the triage vital signs and the nursing notes.  Pertinent labs & imaging results that were available during my care of the patient were reviewed by me and considered in my medical decision making (see chart for details).     52 year old female with a history of nephrolithiasis, HTN, obesity, anxiety, and B12 deficiency who is 7-days post-op from a Roux-en-Y gastric bypass presenting with nausea, vomiting, and abdominal pain.  She initially states that she has chest pain, but when asked to point her pain, she points to her epigastric region.  On exam, her  pain is initially diffuse, but after pain control, is localized to the epigastric region.  She is afebrile with no tachycardia and is normotensive.  Lactate is normal.  No leukocytosis.  Labs are notable for a lipase of 174, which is acute, and hypokalemia of 3.0.  Potassium chloride was ordered in the ED.  Since she initially endorsed chest pain, EKG was ordered which did not demonstrate any acute changes.  Chest x-ray was negative and troponin was negative.  CT abdomen pelvis demonstrated a cluster of 3 small calculi at the left uteropelvic junction with mild proximal hydronephrosis indicative of mild obstruction at this time?  Discussed with Dr. Tyrone Nine, attending physician.  General surgery was consulted and the patient was evaluated by Dr. Redmond Pulling who will admit the patient for further work-up and evaluation. The patient appears reasonably stabilized for admission considering the current resources, flow, and capabilities available in the ED at this time, and I doubt any other [facility] requiring further screening and/or treatment in the ED prior to admission.   Final Clinical Impressions(s) / ED Diagnoses   Final diagnoses:  Nausea and vomiting, intractability of vomiting not specified, unspecified vomiting type    ED Discharge Orders    None       Carmon Sahli, Laymond Purser, PA-C 06/28/18 [place], [place], DO 06/29/18 1113

## 2018-06-27 NOTE — ED Triage Notes (Signed)
Patient reports she had gastric bypass on 11/19. States vomiting daily since surgery and diarrhea today. C/o abdominal cramping.

## 2018-06-27 NOTE — ED Notes (Signed)
Patient transported to X-ray will do ekg when pt returns

## 2018-06-27 NOTE — H&P (Signed)
Mary Sanford is an 52 y.o. female.   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  She states that she has not tolerated the protein shakes.  She can keep down water and ice chips and Gatorade.  She will have white foamy's and will spit up the protein shake about 15 minutes after drinking.  She denies any fevers or chills.  She does endorse epigastric discomfort.  Today it reached a 10 out of 10 so she came to the emergency room.  She states that drinking does not necessarily make the discomfort worse.  It is fairly constant.  She denies any hematuria.  She has been up walking around at home.  She denies any left flank pain.  She denies any shortness of breath.  She has had a few bowel movement since surgery.  Today she had some loose stool.  In the emergency room she was found to have an elevated lipase.  Her hemoglobin and hematocrit were 14 and 44 which were up from discharge a few days ago.  A CT scan was performed which was unremarkable except for some tiny kidney stones on the left side which may be causing some mild hydro-.  There is no radiological evidence of pancreatitis, abscess, or free air or leak.  Past Medical History:  Diagnosis Date  . Allergy   . Anemia   . Anxiety   . Arthritis    arm/ wrist areas  . B12 deficiency   . Constipation    not chronic- now stools soft and regular   . Cough    non- productive  . Depression    no meds  . History of kidney stones   . Hypertension   . Kidney stones     Past Surgical History:  Procedure Laterality Date  . BUNIONECTOMY Bilateral   . caesarean section    . cystoscopic     extraction of ureteric calculus without disintegration  . DILITATION & CURRETTAGE/HYSTROSCOPY WITH NOVASURE ABLATION N/A 06/17/2013    Procedure: DILATATION & CURETTAGE/HYSTEROSCOPY WITH NOVASURE ABLATION;  Surgeon: Osborne Oman, MD;  Location: [facility];  Service: Gynecology;  Laterality: N/A;  . 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 HEMORRHOIDECTOMY WITH HEMORRHOID BANDING  10/19/2012   [facility] [place] Hem ligation/pexy  . TUBAL LIGATION    . WISDOM TOOTH EXTRACTION      Family History  Problem Relation Age of Onset  . Hypertension Mother   . Hypothyroidism Mother   . Asthma Mother   . Cancer Mother 64       Colon and rectal  . Rectal cancer Mother 20  . Colon cancer Mother 72  . Hypotension Sister   . Hypothyroidism Sister   . Diabetes Sister   . Seizures Brother   . Hypothyroidism Sister   . Seizures Sister   . Anemia Sister   . Asthma Unknown   . Hypothyroidism Unknown   . Heart disease Paternal Grandfather        >55  . Stroke Maternal Uncle        > 55  . Deep vein thrombosis Sister   . Early death Neg Hx   . Hyperlipidemia Neg Hx   .  Kidney disease Neg Hx   . Colon polyps Neg Hx   . Esophageal cancer Neg Hx   . Stomach cancer Neg Hx    Social History:  reports that she quit smoking about 18 years ago. Her smoking use included cigarettes. She quit after 5.00 years of use. She has never used smokeless tobacco. She reports that she drinks alcohol. She reports that she does not use drugs.  Allergies:  Allergies  Allergen Reactions  . Naproxen Other (See Comments)    Panic attacks     (Not in a hospital admission)  Results for orders placed or performed during the hospital encounter of 06/27/18 (from the past 48 hour(s))  Lipase, blood     Status: Abnormal   Collection Time: 06/27/18  4:02 PM  Result Value Ref Range   Lipase 174 (H) 11 - 51 U/L    Comment: Performed at [facility], [place] [address] [place], [place] [address]   Comprehensive metabolic panel     Status: Abnormal   Collection Time: 06/27/18  4:02 PM  Result Value Ref Range   Sodium 138 135 - 145 mmol/L   Potassium 3.0 (L) 3.5 - 5.1 mmol/L   Chloride 100 98 - 111 mmol/L   CO2 26 22 - 32 mmol/L   Glucose, Bld 95 70 - 99 mg/dL   BUN 20 6 - 20 mg/dL   Creatinine, Ser 1.03 (H) 0.44 - 1.00 mg/dL   Calcium 9.9 8.9 - 10.3 mg/dL   Total Protein 7.5 6.5 - 8.1 g/dL   Albumin 3.6 3.5 - 5.0 g/dL   AST 44 (H) 15 - 41 U/L   ALT 90 (H) 0 - 44 U/L   Alkaline Phosphatase 56 38 - 126 U/L   Total Bilirubin 0.8 0.3 - 1.2 mg/dL   GFR calc non Af Amer >60 >60 mL/min   GFR calc Af Amer >60 >60 mL/min    Comment: (NOTE) The eGFR has been calculated using the CKD EPI equation. This calculation has not been validated in all clinical situations. eGFR's persistently <60 mL/min signify possible Chronic Kidney Disease.    Anion gap 12 5 - 15    Comment: Performed at [facility], [place] [address], [place], [place] [address]  CBC with Differential     Status: Abnormal   Collection Time: 06/27/18  4:58 PM  Result Value Ref Range   WBC 8.2 4.0 - 10.5 K/uL   RBC 5.99 (H) 3.87 - 5.11 MIL/uL   Hemoglobin 14.0 12.0 - 15.0 g/dL   HCT 44.6 36.0 - 46.0 %   MCV 74.5 (L) 80.0 - 100.0 fL   MCH 23.4 (L) 26.0 - 34.0 pg   MCHC 31.4 30.0 - 36.0 g/dL   RDW 14.6 11.5 - 15.5 %   Platelets 301 150 - 400 K/uL   nRBC 0.0 0.0 - 0.2 %   Neutrophils Relative % 59 %   Neutro Abs 4.9 1.7 - 7.7 K/uL   Lymphocytes Relative 25 %   Lymphs Abs 2.0 0.7 - 4.0 K/uL   Monocytes Relative 12 %   Monocytes Absolute 1.0 0.1 - 1.0 K/uL   Eosinophils Relative 2 %   Eosinophils Absolute 0.2 0.0 - 0.5 K/uL   Basophils Relative 1 %   Basophils Absolute 0.0 0.0 - 0.1 K/uL   Immature Granulocytes 1 %   Abs Immature Granulocytes 0.05 0.00 - 0.07 K/uL    Comment: Performed at [facility], [place]  [address] [place], [place] [address]  I-Stat Troponin, ED (not at [facility])      Status: None   Collection Time: 06/27/18  5:23 PM  Result Value Ref Range   Troponin i, poc 0.00 0.00 - 0.08 ng/mL   Comment 3            Comment: Due to the release kinetics of cTnI, a negative result within the first hours of the onset of symptoms does not rule out myocardial infarction with certainty. If myocardial infarction is still suspected, repeat the test at appropriate intervals.   I-Stat CG4 Lactic Acid, ED     Status: None   Collection Time: 06/27/18  5:24 PM  Result Value Ref Range   Lactic Acid, Venous 1.12 0.5 - 1.9 mmol/L   CBC Latest Ref Rng & Units 06/27/2018 06/23/2018 06/22/2018  WBC 4.0 - 10.5 K/uL 8.2 15.4(H) -  Hemoglobin 12.0 - 15.0 g/dL 14.0 11.6(L) 13.5  Hematocrit 36.0 - 46.0 % 44.6 38.5 44.3  Platelets 150 - 400 K/uL 301 236 -    Dg Chest 2 View  Result Date: 06/27/2018 CLINICAL DATA:  Daily vomiting and diarrhea after recent gastric bypass surgery. EXAM: CHEST - 2 VIEW COMPARISON:  Chest radiograph May 20, 2018 FINDINGS: Cardiomediastinal silhouette is normal. No pleural effusions or focal consolidations. Trachea projects midline and there is no pneumothorax. Soft tissue planes and included osseous structures are non-suspicious. IMPRESSION: Normal chest radiograph. Electronically Signed   By: Elon Alas M.D.   On: 06/27/2018 18:05   Ct Abdomen Pelvis W Contrast  Result Date: 06/27/2018 CLINICAL DATA:  52 year old female with history of a gastric bypass on 06/22/2018. Daily vomiting since the time of surgery. Diarrhea and abdominal cramping today. EXAM: CT ABDOMEN AND PELVIS WITH CONTRAST TECHNIQUE: Multidetector CT imaging of the abdomen and pelvis was performed using the standard protocol following bolus administration of intravenous contrast. CONTRAST:  171m ISOVUE-300 IOPAMIDOL (ISOVUE-300) INJECTION 61% COMPARISON:  CT the abdomen and pelvis 05/13/2018. FINDINGS: Lower chest: Unremarkable. Hepatobiliary: No cystic or solid hepatic  lesions. No intra or extrahepatic biliary ductal dilatation. Gallbladder is normal in appearance. Pancreas: No pancreatic mass. No pancreatic ductal dilatation. No pancreatic or peripancreatic fluid or inflammatory changes. Spleen: Unremarkable. Adrenals/Urinary Tract: Multiple nonobstructive calculi are noted within the left renal collecting system measuring 3-4 mm in size. In addition, at the left ureteropelvic junction there is a cluster of 3 small calculi measuring 2-3 mm in size best appreciated on coronal image 64 of series 6, with mild proximal hydronephrosis. Right kidney and bilateral adrenal glands are normal in appearance. No right-sided hydroureteronephrosis. Urinary bladder is normal in appearance. Stomach/Bowel: Status post Roux-en-Y gastric bypass. No pathologic dilatation of small bowel or colon. Normal appendix. Vascular/Lymphatic: Mild atherosclerotic calcifications noted throughout the pelvic vasculature. No lymphadenopathy noted in the abdomen or pelvis. Reproductive: Uterus is heterogeneous in appearance suggesting the presence of multiple small fibroids. Ovaries are unremarkable in appearance. Other: No significant volume of ascites.  No pneumoperitoneum. Musculoskeletal: There are no aggressive appearing lytic or blastic lesions noted in the visualized portions of the skeleton. IMPRESSION: 1. There is a cluster of 3 small 2-3 mm calculi at the left ureteropelvic junction with mild proximal hydronephrosis indicative of mild obstruction at this time. 2. Multiple additional nonobstructive calculi are noted in the collecting system of the left kidney measuring 2-3 mm in size. 3. Status post Roux-en-Y gastric bypass. Electronically Signed   By: DVinnie LangtonM.D.   On: 06/27/2018 20:22  Review of Systems  Constitutional: Positive for malaise/fatigue. Negative for weight loss.  HENT: Negative for nosebleeds.   Eyes: Negative for blurred vision.  Respiratory: Negative for shortness of  breath.   Cardiovascular: Negative for chest pain, palpitations, orthopnea and PND.       Denies DOE  Gastrointestinal: Positive for abdominal pain and nausea.  Genitourinary: Negative for dysuria, frequency, hematuria and urgency.  Musculoskeletal: Negative.   Skin: Negative for itching and rash.  Neurological: Negative for dizziness, focal weakness, seizures, loss of consciousness and headaches.       Denies TIAs, amaurosis fugax  Endo/Heme/Allergies: Does not bruise/bleed easily.  Psychiatric/Behavioral: The patient is not nervous/anxious.     Blood pressure 123/77, pulse 85, temperature 97.9 F (36.6 C), temperature source Oral, resp. rate 16, SpO2 98 %. Physical Exam  Vitals reviewed. Constitutional: She is oriented to person, place, and time. She appears well-developed and well-nourished. No distress.  HENT:  Head: Normocephalic and atraumatic.  Right Ear: External ear normal.  Left Ear: External ear normal.  Eyes: Conjunctivae are normal. No scleral icterus.  Neck: Normal range of motion. Neck supple. No tracheal deviation present. No thyromegaly present.  Cardiovascular: Normal rate and normal heart sounds.  Respiratory: Effort normal and breath sounds normal. No stridor. No respiratory distress. She has no wheezes.  GI: Soft. She exhibits no distension. There is no rebound and no guarding.  Healing trocar sites Min TTP; no rebound/guarding  Musculoskeletal: She exhibits no edema or tenderness.  Lymphadenopathy:    She has no cervical adenopathy.  Neurological: She is alert and oriented to person, place, and time. She exhibits normal muscle tone.  Skin: Skin is warm and dry. No rash noted. She is not diaphoretic. No erythema. No pallor.  Psychiatric: She has a normal mood and affect. Her behavior is normal. Judgment and thought content normal.     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  There is no sign of leak.  She is afebrile.  She is not tachycardic.  She has a normal white blood cell count.  I think she is little bit hemoconcentrated since her hemoglobin is up about three-point since discharge.  I believe the lipase also reflects dehydration.  There is no evidence of pancreatitis on imaging.  Her urinalysis is pending.  I briefly discussed her case with the urology resident who recommended just making sure that there is no signs of the UTI and no significant blood in her urine.  Admit for hydration Bariatric clears as tolerated Ambulate, chemical DVT prophylaxis Follow-up urinalysis Repeat labs in the morning Continue PPI and add Carafate  Discussed with patient that we can sometimes see this with postoperative patients with trouble tolerating protein shakes and having white foamies.   Leighton Ruff. Redmond Pulling, MD, FACS General, Bariatric, & Minimally Invasive Surgery [facility], [place]   Greer Pickerel, MD 06/27/2018, 10:13 PM

## 2018-06-27 NOTE — ED Notes (Signed)
New IV inserted as previous IV is frequently occluded during IVF/medication admin.

## 2018-06-28 ENCOUNTER — Encounter (HOSPITAL_COMMUNITY)
Admission: EM | Disposition: A | Discharge status: Home / Self Care | Payer: Self-pay | Source: Home / Self Care | Attending: Emergency Medicine

## 2018-06-28 ENCOUNTER — Observation Stay (HOSPITAL_COMMUNITY): Payer: Federal, State, Local not specified - PPO | Admitting: Certified Registered"

## 2018-06-28 ENCOUNTER — Observation Stay (HOSPITAL_COMMUNITY): Payer: Federal, State, Local not specified - PPO

## 2018-06-28 ENCOUNTER — Other Ambulatory Visit: Payer: Self-pay

## 2018-06-28 ENCOUNTER — Other Ambulatory Visit: Payer: Self-pay | Admitting: General Surgery

## 2018-06-28 ENCOUNTER — Encounter (HOSPITAL_COMMUNITY): Payer: Self-pay

## 2018-06-28 DIAGNOSIS — R112 Nausea with vomiting, unspecified: Secondary | ICD-10-CM | POA: Diagnosis not present

## 2018-06-28 DIAGNOSIS — N201 Calculus of ureter: Secondary | ICD-10-CM | POA: Diagnosis not present

## 2018-06-28 DIAGNOSIS — N132 Hydronephrosis with renal and ureteral calculous obstruction: Secondary | ICD-10-CM | POA: Diagnosis not present

## 2018-06-28 DIAGNOSIS — I10 Essential (primary) hypertension: Secondary | ICD-10-CM | POA: Diagnosis not present

## 2018-06-28 HISTORY — PX: CYSTOSCOPY W/ RETROGRADES: SHX1426

## 2018-06-28 LAB — COMPREHENSIVE METABOLIC PANEL
ALBUMIN: 2.9 g/dL — AB (ref 3.5–5.0)
ALK PHOS: 40 U/L (ref 38–126)
ALT: 69 U/L — AB (ref 0–44)
AST: 33 U/L (ref 15–41)
Anion gap: 9 (ref 5–15)
BUN: 13 mg/dL (ref 6–20)
CALCIUM: 8.6 mg/dL — AB (ref 8.9–10.3)
CO2: 22 mmol/L (ref 22–32)
CREATININE: 0.79 mg/dL (ref 0.44–1.00)
Chloride: 107 mmol/L (ref 98–111)
GFR calc Af Amer: >60 mL/min (ref >60)
GFR calc non Af Amer: >60 mL/min (ref >60)
GLUCOSE: 110 mg/dL — AB (ref 70–99)
Potassium: 3.3 mmol/L — ABNORMAL LOW (ref 3.5–5.1)
Sodium: 138 mmol/L (ref 135–145)
Total Bilirubin: 0.6 mg/dL (ref 0.3–1.2)
Total Protein: 5.8 g/dL — ABNORMAL LOW (ref 6.5–8.1)

## 2018-06-28 LAB — MAGNESIUM: Magnesium: 2 mg/dL (ref 1.7–2.4)

## 2018-06-28 LAB — LIPASE, BLOOD: LIPASE: 137 U/L — AB (ref 11–51)

## 2018-06-28 SURGERY — CYSTOSCOPY, WITH RETROGRADE PYELOGRAM
Anesthesia: General | Laterality: Left

## 2018-06-28 MED ORDER — POTASSIUM CHLORIDE 10 MEQ/100ML IV SOLN
10.0000 meq | INTRAVENOUS | Status: AC
  Administered 2018-06-28 (×3): 10 meq via INTRAVENOUS
  Filled 2018-06-28 (×3): qty 100

## 2018-06-28 MED ORDER — SIMETHICONE 80 MG PO CHEW: 80.0000 mg | CHEWABLE_TABLET | Freq: Four times a day (QID) | ORAL | Status: DC | PRN

## 2018-06-28 MED ORDER — LIDOCAINE 2% (20 MG/ML) 5 ML SYRINGE
INTRAMUSCULAR | Status: DC | PRN
  Administered 2018-06-28: 100 mg via INTRAVENOUS

## 2018-06-28 MED ORDER — FENTANYL CITRATE (PF) 100 MCG/2ML IJ SOLN
INTRAMUSCULAR | Status: DC | PRN
  Administered 2018-06-28: 100 ug via INTRAVENOUS

## 2018-06-28 MED ORDER — SUCRALFATE 1 GM/10ML PO SUSP
1.0000 g | Freq: Three times a day (TID) | ORAL | Status: DC
  Administered 2018-06-28 – 2018-06-29 (×7): 1 g via ORAL
  Filled 2018-06-28 (×7): qty 10

## 2018-06-28 MED ORDER — IOHEXOL 300 MG/ML  SOLN
INTRAMUSCULAR | Status: DC | PRN
  Administered 2018-06-28: 18 mL via URETHRAL

## 2018-06-28 MED ORDER — LACTATED RINGERS IV SOLN
INTRAVENOUS | Status: DC
  Administered 2018-06-28: 13:00:00 via INTRAVENOUS

## 2018-06-28 MED ORDER — PROPOFOL 10 MG/ML IV BOLUS
INTRAVENOUS | Status: DC | PRN
  Administered 2018-06-28: 180 mg via INTRAVENOUS

## 2018-06-28 MED ORDER — FENTANYL CITRATE (PF) 100 MCG/2ML IJ SOLN
25.0000 ug | INTRAMUSCULAR | Status: DC | PRN
  Administered 2018-06-28: 50 ug via INTRAVENOUS

## 2018-06-28 MED ORDER — ENOXAPARIN SODIUM 30 MG/0.3ML ~~LOC~~ SOLN
30.0000 mg | Freq: Two times a day (BID) | SUBCUTANEOUS | Status: DC
  Administered 2018-06-28 – 2018-06-29 (×3): 30 mg via SUBCUTANEOUS
  Filled 2018-06-28 (×3): qty 0.3

## 2018-06-28 MED ORDER — CEFAZOLIN SODIUM-DEXTROSE 2-4 GM/100ML-% IV SOLN
2.0000 g | INTRAVENOUS | Status: AC
  Administered 2018-06-28: 2 g via INTRAVENOUS

## 2018-06-28 MED ORDER — EPHEDRINE SULFATE-NACL 50-0.9 MG/10ML-% IV SOSY
PREFILLED_SYRINGE | INTRAVENOUS | Status: DC | PRN
  Administered 2018-06-28 (×2): 10 mg via INTRAVENOUS

## 2018-06-28 MED ORDER — PROMETHAZINE HCL 25 MG/ML IJ SOLN: 12.5000 mg | Freq: Four times a day (QID) | INTRAMUSCULAR | Status: DC | PRN

## 2018-06-28 MED ORDER — PANTOPRAZOLE SODIUM 40 MG IV SOLR
40.0000 mg | Freq: Every day | INTRAVENOUS | Status: DC
  Administered 2018-06-28: 40 mg via INTRAVENOUS
  Filled 2018-06-28: qty 40

## 2018-06-28 MED ORDER — PROMETHAZINE HCL 25 MG/ML IJ SOLN: 6.2500 mg | INTRAMUSCULAR | Status: DC | PRN

## 2018-06-28 MED ORDER — DEXAMETHASONE SODIUM PHOSPHATE 10 MG/ML IJ SOLN
INTRAMUSCULAR | Status: DC | PRN
  Administered 2018-06-28: 10 mg via INTRAVENOUS

## 2018-06-28 MED ORDER — ACETAMINOPHEN 160 MG/5ML PO SOLN
650.0000 mg | Freq: Four times a day (QID) | ORAL | Status: DC
  Administered 2018-06-28 – 2018-06-29 (×3): 650 mg via ORAL
  Filled 2018-06-28 (×5): qty 20.3

## 2018-06-28 MED ORDER — MIDAZOLAM HCL 5 MG/5ML IJ SOLN
INTRAMUSCULAR | Status: DC | PRN
  Administered 2018-06-28: 2 mg via INTRAVENOUS

## 2018-06-28 MED ORDER — CEFAZOLIN SODIUM-DEXTROSE 2-4 GM/100ML-% IV SOLN
INTRAVENOUS | Status: AC
  Filled 2018-06-28: qty 100

## 2018-06-28 MED ORDER — STERILE WATER FOR IRRIGATION IR SOLN
Status: DC | PRN
  Administered 2018-06-28: 3000 mL

## 2018-06-28 MED ORDER — FENTANYL CITRATE (PF) 100 MCG/2ML IJ SOLN
INTRAMUSCULAR | Status: AC
  Filled 2018-06-28: qty 2

## 2018-06-28 MED ORDER — OXYCODONE HCL 5 MG/5ML PO SOLN
5.0000 mg | ORAL | Status: DC | PRN
  Administered 2018-06-28 – 2018-06-29 (×3): 5 mg via ORAL
  Filled 2018-06-28 (×3): qty 5

## 2018-06-28 MED ORDER — LIDOCAINE 2% (20 MG/ML) 5 ML SYRINGE
INTRAMUSCULAR | Status: AC
  Filled 2018-06-28: qty 5

## 2018-06-28 MED ORDER — KETOROLAC TROMETHAMINE 30 MG/ML IJ SOLN: 30.0000 mg | Freq: Once | INTRAMUSCULAR | Status: DC | PRN

## 2018-06-28 MED ORDER — DIPHENHYDRAMINE HCL 50 MG/ML IJ SOLN: 12.5000 mg | Freq: Three times a day (TID) | INTRAMUSCULAR | Status: DC | PRN

## 2018-06-28 MED ORDER — ESCITALOPRAM OXALATE 10 MG PO TABS
10.0000 mg | ORAL_TABLET | Freq: Every day | ORAL | Status: DC
  Administered 2018-06-28 – 2018-06-29 (×2): 10 mg via ORAL
  Filled 2018-06-28 (×2): qty 1

## 2018-06-28 MED ORDER — ALBUTEROL SULFATE (2.5 MG/3ML) 0.083% IN NEBU: 2.5000 mg | INHALATION_SOLUTION | Freq: Four times a day (QID) | RESPIRATORY_TRACT | Status: DC | PRN

## 2018-06-28 MED ORDER — NEBIVOLOL HCL 5 MG PO TABS
5.0000 mg | ORAL_TABLET | Freq: Every day | ORAL | Status: DC
  Administered 2018-06-28 – 2018-06-29 (×2): 5 mg via ORAL
  Filled 2018-06-28 (×2): qty 1

## 2018-06-28 MED ORDER — ALBUTEROL SULFATE HFA 108 (90 BASE) MCG/ACT IN AERS: 1.0000 | INHALATION_SPRAY | Freq: Four times a day (QID) | RESPIRATORY_TRACT | Status: DC | PRN

## 2018-06-28 MED ORDER — MORPHINE SULFATE (PF) 2 MG/ML IV SOLN
1.0000 mg | INTRAVENOUS | Status: DC | PRN
  Administered 2018-06-28 (×3): 2 mg via INTRAVENOUS
  Filled 2018-06-28 (×3): qty 1

## 2018-06-28 MED ORDER — PROPOFOL 10 MG/ML IV BOLUS
INTRAVENOUS | Status: AC
  Filled 2018-06-28: qty 20

## 2018-06-28 MED ORDER — ONDANSETRON HCL 4 MG/2ML IJ SOLN
4.0000 mg | Freq: Four times a day (QID) | INTRAMUSCULAR | Status: DC | PRN
  Administered 2018-06-28 (×2): 4 mg via INTRAVENOUS
  Filled 2018-06-28 (×2): qty 2

## 2018-06-28 MED ORDER — KCL IN DEXTROSE-NACL 20-5-0.45 MEQ/L-%-% IV SOLN
INTRAVENOUS | Status: DC
  Administered 2018-06-28 – 2018-06-29 (×3): via INTRAVENOUS
  Filled 2018-06-28 (×3): qty 1000

## 2018-06-28 MED ORDER — 0.9 % SODIUM CHLORIDE (POUR BTL) OPTIME
TOPICAL | Status: DC | PRN
  Administered 2018-06-28: 1000 mL

## 2018-06-28 MED ORDER — MIDAZOLAM HCL 2 MG/2ML IJ SOLN
INTRAMUSCULAR | Status: AC
  Filled 2018-06-28: qty 2

## 2018-06-28 MED ORDER — ONDANSETRON HCL 4 MG/2ML IJ SOLN
INTRAMUSCULAR | Status: DC | PRN
  Administered 2018-06-28: 4 mg via INTRAVENOUS

## 2018-06-28 SURGICAL SUPPLY — 12 items
BAG URO CATCHER STRL LF (MISCELLANEOUS) ×2 IMPLANT
CATH URET 5FR 28IN OPEN ENDED (CATHETERS) ×2 IMPLANT
CLOTH BEACON ORANGE TIMEOUT ST (SAFETY) IMPLANT
COVER WAND RF STERILE (DRAPES) IMPLANT
GLOVE BIOGEL M STRL SZ7.5 (GLOVE) ×2 IMPLANT
GOWN STRL REUS W/TWL LRG LVL3 (GOWN DISPOSABLE) ×2 IMPLANT
GUIDEWIRE ANG ZIPWIRE 038X150 (WIRE) ×2 IMPLANT
GUIDEWIRE STR DUAL SENSOR (WIRE) IMPLANT
MANIFOLD NEPTUNE II (INSTRUMENTS) ×2 IMPLANT
PACK CYSTO (CUSTOM PROCEDURE TRAY) ×2 IMPLANT
STENT URET 6FRX24 CONTOUR (STENTS) ×2 IMPLANT
TUBING CONNECTING 10 (TUBING) ×2 IMPLANT

## 2018-06-28 NOTE — Anesthesia Preprocedure Evaluation (Signed)
Anesthesia Evaluation  Patient identified by MRN, date of birth, ID band Patient awake    Reviewed: Allergy & Precautions, NPO status , Patient's Chart, lab work & pertinent test results  Airway Mallampati: II  TM Distance: >3 FB Neck ROM: Full    Dental no notable dental hx.    Pulmonary neg pulmonary ROS, former smoker,    Pulmonary exam normal breath sounds clear to auscultation       Cardiovascular hypertension, negative cardio ROS Normal cardiovascular exam Rhythm:Regular Rate:Normal     Neuro/Psych negative neurological ROS  negative psych ROS   GI/Hepatic negative GI ROS, Neg liver ROS,   Endo/Other  negative endocrine ROS  Renal/GU negative Renal ROS  negative genitourinary   Musculoskeletal negative musculoskeletal ROS (+)   Abdominal   Peds negative pediatric ROS (+)  Hematology negative hematology ROS (+)   Anesthesia Other Findings   Reproductive/Obstetrics negative OB ROS                             Anesthesia Physical Anesthesia Plan  ASA: II  Anesthesia Plan: General   Post-op Pain Management:    Induction: Intravenous  PONV Risk Score and Plan: 3 and Ondansetron, Dexamethasone and Treatment may vary due to age or medical condition  Airway Management Planned: LMA  Additional Equipment:   Intra-op Plan:   Post-operative Plan: Extubation in OR  Informed Consent: I have reviewed the patients History and Physical, chart, labs and discussed the procedure including the risks, benefits and alternatives for the proposed anesthesia with the patient or authorized representative who has indicated his/her understanding and acceptance.   Dental advisory given  Plan Discussed with: CRNA and Surgeon  Anesthesia Plan Comments:         Anesthesia Quick Evaluation

## 2018-06-28 NOTE — Plan of Care (Signed)

## 2018-06-28 NOTE — Progress Notes (Addendum)
Patient alert and oriented.  Friend at bedside.  Answered questions regarding diet.  Discussed using Unjury product versus premiere protein when diet restarted.  Plan established, bedside RN aware.  Await direction from urology.  Will continue to monitor

## 2018-06-28 NOTE — Progress Notes (Signed)
Hydration clinic appointment made for 07/05/18.

## 2018-06-28 NOTE — Progress Notes (Signed)
Patient ID: Mary Sanford, female   DOB: 1966/01/15, 52 y.o.   MRN: [id number]   Progress Note: Metabolic and Bariatric Surgery Service   Chief Complaint/Subjective: Min discomfort No n/v overnight Coffee last night went well  Objective: Vital signs in last 24 hours: Temp:  [97.5 F (36.4 C)-97.9 F (36.6 C)] 97.5 F (36.4 C) (11/25 0611) Pulse Rate:  [67-94] 67 (11/25 0611) Resp:  [12-35] 17 (11/25 0611) BP: (110-154)/(69-103) 110/71 (11/25 0611) SpO2:  [98 %-100 %] 99 % (11/25 0611) Weight:  [761 kg] 112 kg (11/25 0300) Last BM Date: 06/27/18  Intake/Output from previous day: 11/24 0701 - 11/25 0700 In: 3513.9 [P.O.:450; I.V.:1963.9; IV Piggyback:1100] Out: 500 [Urine:500] Intake/Output this shift: No intake/output data recorded.  Lungs: cta  Cardiovascular: reg  Abd: soft, min TTP, nd, incisions ok;  ?mild L flank TTP  Extremities: no edema, scds  Neuro: alert, nonfocal, nontoxic  Lab Results: CBC  Recent Labs    06/27/18 1658  WBC 8.2  HGB 14.0  HCT 44.6  PLT 301   BMET Recent Labs    06/27/18 1602 06/28/18 0449  NA 138 138  K 3.0* 3.3*  CL 100 107  CO2 26 22  GLUCOSE 95 110*  BUN 20 13  CREATININE 1.03* 0.79  CALCIUM 9.9 8.6*   PT/INR No results for input(s): LABPROT, INR in the last 72 hours. ABG No results for input(s): PHART, HCO3 in the last 72 hours.  Invalid input(s): PCO2, PO2  Studies/Results:  Anti-infectives: Anti-infectives (From admission, onward)   None      Medications: Scheduled Meds: . acetaminophen (TYLENOL) oral liquid 160 mg/5 mL  650 mg Oral Q6H  . enoxaparin (LOVENOX) injection  30 mg Subcutaneous Q12H  . escitalopram  10 mg Oral Daily  . nebivolol  5 mg Oral Daily  . pantoprazole (PROTONIX) IV  40 mg Intravenous QHS  . sucralfate  1 g Oral TID WC & HS   Continuous Infusions: . dextrose 5 % and 0.45 % NaCl with KCl 20 mEq/L 125 mL/hr at 06/28/18 0600  . potassium chloride 10 mEq (06/28/18 0832)   PRN  Meds:.albuterol, diphenhydrAMINE, morphine injection, ondansetron (ZOFRAN) IV, oxyCODONE, promethazine, simethicone  Assessment/Plan: Patient Active Problem List   Diagnosis Date Noted  . Nausea & vomiting 06/27/2018  . Chronic low back pain 06/22/2018  . Severe obesity ([place]) 06/22/2018  . RTI (respiratory tract infection) 02/10/2018  . ASCUS pap with negative high risk HPV on 06/30/16 06/30/2016  . Depression with anxiety 12/28/2015  . De Quervain's tenosynovitis, left 06/21/2015  . Prediabetes 11/02/2014  . Anemia, iron deficiency 09/17/2013  . Routine general medical examination at a health care facility 01/12/2013  . Allergic rhinitis 01/12/2013  . Constipation, chronic 10/04/2012  . Visit for screening mammogram 01/12/2012  . B12 deficiency anemia 01/09/2012  . Obesity (BMI 30-39.9) 01/09/2012  . 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 Spoke with Dr Lovena Neighbours who will see. Will make NPO x meds, ice until know urology plan.  Ambulate Elevated lipase - trending down. No rad evidence of pancreatitis  Disposition: urology evaluation pending; if they don't plan stent - pt can resume bari clears.  Discussed with pt.   LOS: 0 days    Greer Pickerel, MD [phone] [facility]

## 2018-06-28 NOTE — Transfer of Care (Signed)
Immediate Anesthesia Transfer of Care Note  Patient: Mary Sanford  Procedure(s) Performed: CYSTOSCOPY WITH RETROGRADE PYELOGRAM LEFT STENT (Left )  Patient Location: PACU  Anesthesia Type:General  Level of Consciousness: sedated, patient cooperative and responds to stimulation  Airway & Oxygen Therapy: Patient Spontanous Breathing and Patient connected to face mask oxygen  Post-op Assessment: Report given to RN and Post -op Vital signs reviewed and stable  Post vital signs: Reviewed and stable  Last Vitals:  Vitals Value Taken Time  BP 137/74 06/28/2018  2:45 PM  Temp    Pulse 82 06/28/2018  2:47 PM  Resp 10 06/28/2018  2:47 PM  SpO2 100 % 06/28/2018  2:47 PM  Vitals shown include unvalidated device data.  Last Pain:  Vitals:   06/28/18 1335  TempSrc:   PainSc: 0-No pain      Patients Stated Pain Goal: 3 (11/15/62 3837)  Complications: No apparent anesthesia complications

## 2018-06-28 NOTE — Op Note (Signed)
Operative Note  Preoperative diagnosis:  1.  Obstructing left UPJ calculi  Postoperative diagnosis: 1.  Obstructing left UPJ calculi  Procedure(s): 1.  Cystoscopy with left JJ stent placement 2.  Left retrograde pyelogram with intraoperative interpretation of fluoroscopic imaging  Surgeon: Ellison Hughs, MD  Assistants:  None  Anesthesia:  General  Complications:  None  EBL: Less than 5 mL  Specimens: 1.  None  Drains/Catheters: 1.  Left 6 French by 24 cm JJ stent without tether  Intraoperative findings:   1. Left retrograde pyelogram revealed multiple obstructing stones within the midportion of the left ureter, consistent with her CT findings.  There were no filling defects within the proximal aspects of the left ureter nor within the left renal pelvis.  Indication:  Mary Sanford is a 52 y.o. female with  a history of kidney stones and is status post Roux-en-Y gastric bypass on 06/22/2018, who presented to the [facility] emergency department with acute onset of worsening left-sided flank pain associated with nausea/vomiting.  She had a CT stone study on 06/27/2018 that demonstrated multiple calculi obstructing the left UPJ and causing mild to moderate left-sided hydronephrosis.  Her creatinine is slightly above baseline at 1.03 and her white blood cell count is 8.2.  Urinalysis shows no overt signs of urinary tract infection.  She has been consented for the above procedures, voices understanding and wishes to proceed.  Description of procedure:  After informed consent was obtained, the patient was brought to the operating room and general LMA anesthesia was administered. The patient was then placed in the dorsolithotomy position and prepped and draped in usual sterile fashion. A timeout was performed. A 23 French rigid cystoscope was then inserted into the urethral meatus and advanced into the bladder under direct vision. A complete bladder survey revealed no intravesical  pathology.  A 5 French ureteral catheter was then inserted to the left ureteral orifice and a left retrograde pyelogram was obtained, with the findings listed above.  A Glidewire was then advanced through the lumen of the ureteral catheter and up to the left renal pelvis, under fluoroscopic guidance.  The ureteral catheter was then removed, leaving the wire in place.  A 6 French by 24 cm JJ stent was then placed over the wire and into good position within the left collecting system, confirming placement via fluoroscopy.  The patient's bladder was then drained.  She tolerated the procedure well and was transferred to the postanesthesia in stable condition.  Plan: Definitive stone treatment will be scheduled in the next 1 to 2 weeks.

## 2018-06-28 NOTE — Anesthesia Procedure Notes (Signed)
Procedure Name: LMA Insertion Date/Time: 06/28/2018 2:11 PM Performed by: Adalynne Steffensmeier D, CRNA Pre-anesthesia Checklist: Patient identified, Emergency Drugs available, Suction available and Patient being monitored Patient Re-evaluated:Patient Re-evaluated prior to induction Oxygen Delivery Method: Circle system utilized Preoxygenation: Pre-oxygenation with 100% oxygen Induction Type: IV induction Ventilation: Mask ventilation without difficulty LMA: LMA inserted LMA Size: 4.0 Tube type: Oral Number of attempts: 1 Placement Confirmation: positive ETCO2 and breath sounds checked- equal and bilateral Tube secured with: Tape Dental Injury: Teeth and Oropharynx as per pre-operative assessment

## 2018-06-28 NOTE — Plan of Care (Signed)
  Problem: Education: Goal: Knowledge of General Education information will improve Description Including pain rating scale, medication(s)/side effects and non-pharmacologic comfort measures 06/28/2018 0244 by Blase Mess, RN Outcome: Progressing 06/28/2018 0242 by Blase Mess, RN Outcome: Progressing   Problem: Health Behavior/Discharge Planning: Goal: Ability to manage health-related needs will improve 06/28/2018 0244 by Blase Mess, RN Outcome: Progressing 06/28/2018 0242 by Blase Mess, RN Outcome: Progressing   Problem: Clinical Measurements: Goal: Ability to maintain clinical measurements within normal limits will improve 06/28/2018 0244 by Blase Mess, RN Outcome: Progressing 06/28/2018 0242 by Blase Mess, RN Outcome: Progressing Goal: Will remain free from infection 06/28/2018 0244 by Blase Mess, RN Outcome: Progressing 06/28/2018 0242 by Blase Mess, RN Outcome: Progressing Goal: Diagnostic test results will improve 06/28/2018 0244 by Blase Mess, RN Outcome: Progressing 06/28/2018 0242 by Blase Mess, RN Outcome: Progressing Goal: Respiratory complications will improve 06/28/2018 0244 by Blase Mess, RN Outcome: Progressing 06/28/2018 0242 by Blase Mess, RN Outcome: Progressing Goal: Cardiovascular complication will be avoided 06/28/2018 0244 by Blase Mess, RN Outcome: Progressing 06/28/2018 0242 by Blase Mess, RN Outcome: Progressing   Problem: Activity: Goal: Risk for activity intolerance will decrease 06/28/2018 0244 by Blase Mess, RN Outcome: Progressing 06/28/2018 0242 by Blase Mess, RN Outcome: Progressing   Problem: Nutrition: Goal: Adequate nutrition will be maintained 06/28/2018 0244 by Blase Mess, RN Outcome: Progressing 06/28/2018 0242 by Blase Mess, RN Outcome: Progressing   Problem:  Coping: Goal: Level of anxiety will decrease 06/28/2018 0244 by Blase Mess, RN Outcome: Progressing 06/28/2018 0242 by Blase Mess, RN Outcome: Progressing   Problem: Elimination: Goal: Will not experience complications related to bowel motility 06/28/2018 0244 by Blase Mess, RN Outcome: Progressing 06/28/2018 0242 by Blase Mess, RN Outcome: Progressing Goal: Will not experience complications related to urinary retention 06/28/2018 0244 by Blase Mess, RN Outcome: Progressing 06/28/2018 0242 by Blase Mess, RN Outcome: Progressing   Problem: Pain Managment: Goal: General experience of comfort will improve 06/28/2018 0244 by Blase Mess, RN Outcome: Progressing 06/28/2018 0242 by Blase Mess, RN Outcome: Progressing   Problem: Safety: Goal: Ability to remain free from injury will improve 06/28/2018 0244 by Blase Mess, RN Outcome: Progressing 06/28/2018 0242 by Blase Mess, RN Outcome: Progressing   Problem: Skin Integrity: Goal: Risk for impaired skin integrity will decrease 06/28/2018 0244 by Blase Mess, RN Outcome: Progressing 06/28/2018 0242 by Blase Mess, RN Outcome: Progressing

## 2018-06-28 NOTE — Consult Note (Signed)
Urology Consult   Physician requesting consult: Greer Pickerel, MD  Reason for consult: Left ureteral stones with signs of obstruction and nausea/vomiting status post Roux-en-Y gastric bypass  History of Present Illness: Mary Sanford is a 52 y.o. 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  She had a CT stone study on 06/27/2018 that demonstrated multiple calculi obstructing the left UPJ and causing mild to moderate left-sided hydronephrosis.  Her creatinine is slightly above baseline at 1.03 and her white blood cell count is 8.2.  Urinalysis shows no overt signs of urinary tract infection  Currently, the patient's flank pain and nausea/vomiting are moderately controlled and she is resting comfortably in bed.    Past Medical History:  Diagnosis Date  . Allergy   . Anemia   . Anxiety   . Arthritis    arm/ wrist areas  . B12 deficiency   . Constipation    not chronic- now stools soft and regular   . Cough    non- productive  . Depression    no meds  . History of kidney stones   . Hypertension   . Kidney stones     Past Surgical History:  Procedure Laterality Date  . BUNIONECTOMY Bilateral   . caesarean section    . cystoscopic     extraction of ureteric calculus without disintegration  . DILITATION & CURRETTAGE/HYSTROSCOPY WITH NOVASURE ABLATION N/A 06/17/2013   Procedure: DILATATION & CURETTAGE/HYSTEROSCOPY WITH NOVASURE ABLATION;  Surgeon: Osborne Oman, MD;  Location: [facility];  Service: Gynecology;  Laterality: N/A;  . 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 HEMORRHOIDECTOMY WITH HEMORRHOID BANDING  10/19/2012   [facility]  ligation/pexy  . TUBAL LIGATION    . WISDOM TOOTH EXTRACTION      Current Hospital Medications:  Home Meds:  Current Meds  Medication Sig  . acetaminophen (TYLENOL) 500 MG tablet Take 1,000 mg by mouth every 6 (six) hours as needed for mild pain.   Marland Kitchen albuterol (PROVENTIL HFA;VENTOLIN HFA) 108 (90 Base) MCG/ACT inhaler Inhale 1-2 puffs into the lungs every 6 (six) hours as needed for wheezing or shortness of breath.  . escitalopram (LEXAPRO) 10 MG tablet Take 1 tablet (10 mg total) by mouth daily.  . hydrochlorothiazide (HYDRODIURIL) 25 MG tablet TAKE 1 TABLET BY MOUTH ONCE DAILY (Patient taking differently: Take 25 mg by mouth daily. )  . nebivolol (BYSTOLIC) 5 MG tablet Take 1 tablet (5 mg total) by mouth daily.  . ondansetron (ZOFRAN-ODT) 4 MG disintegrating tablet Take 1 tablet (4 mg total) by mouth every 6 (six) hours as needed for nausea or vomiting.  . potassium chloride (K-DUR,KLOR-CON) 10 MEQ tablet Take 1 tablet (10 mEq total) by mouth 2 (two) times daily. (Patient taking differently: Take 10 mEq by mouth daily. )  . traMADol (ULTRAM) 50 MG tablet Take 1 tablet (50 mg total) by mouth every 6 (six) hours as needed for severe pain.    Scheduled Meds: . acetaminophen (TYLENOL) oral liquid 160 mg/5 mL  650 mg Oral Q6H  . enoxaparin (LOVENOX) injection  30 mg Subcutaneous Q12H  . escitalopram  10 mg Oral Daily  . nebivolol  5 mg Oral Daily  .  pantoprazole (PROTONIX) IV  40 mg Intravenous QHS  . sucralfate  1 g Oral TID WC & HS   Continuous Infusions: .  ceFAZolin (ANCEF) IV    . dextrose 5 % and 0.45 % NaCl with KCl 20 mEq/L 125 mL/hr at 06/28/18 0600   PRN Meds:.albuterol, diphenhydrAMINE, morphine injection, ondansetron (ZOFRAN) IV, oxyCODONE, promethazine, simethicone  Allergies:  Allergies  Allergen Reactions  . Naproxen Other (See Comments)    Panic attacks    Family History  Problem Relation Age of Onset  . Hypertension Mother   . Hypothyroidism Mother   .  Asthma Mother   . Cancer Mother 21       Colon and rectal  . Rectal cancer Mother 43  . Colon cancer Mother 56  . Hypotension Sister   . Hypothyroidism Sister   . Diabetes Sister   . Seizures Brother   . Hypothyroidism Sister   . Seizures Sister   . Anemia Sister   . Asthma Unknown   . Hypothyroidism Unknown   . Heart disease Paternal Grandfather        >55  . Stroke Maternal Uncle        > 55  . Deep vein thrombosis Sister   . Early death Neg Hx   . Hyperlipidemia Neg Hx   . Kidney disease Neg Hx   . Colon polyps Neg Hx   . Esophageal cancer Neg Hx   . Stomach cancer Neg Hx     Social History:  reports that she quit smoking about 18 years ago. Her smoking use included cigarettes. She quit after 5.00 years of use. She has never used smokeless tobacco. She reports that she drinks alcohol. She reports that she does not use drugs.  ROS: A complete review of systems was performed.  All systems are negative except for pertinent findings as noted.  Physical Exam:  Vital signs in last 24 hours: Temp:  [97.4 F (36.3 C)-97.9 F (36.6 C)] 97.4 F (36.3 C) (11/25 1048) Pulse Rate:  [63-94] 63 (11/25 1048) Resp:  [12-35] 14 (11/25 1048) BP: (110-154)/(69-103) 127/91 (11/25 1048) SpO2:  [98 %-100 %] 100 % (11/25 1048) Weight:  [623 kg] 112 kg (11/25 0300) Constitutional:  Alert and oriented, No acute distress Cardiovascular: Regular rate and rhythm, No JVD Respiratory: Normal respiratory effort, Lungs clear bilaterally GI: Abdomen is soft, nontender, nondistended, no abdominal masses GU: No CVA tenderness Lymphatic: No lymphadenopathy Neurologic: Grossly intact, no focal deficits Psychiatric: Normal mood and affect  Laboratory Data:  Recent Labs    06/27/18 1658  WBC 8.2  HGB 14.0  HCT 44.6  PLT 301    Recent Labs    06/27/18 1602 06/28/18 0449  NA 138 138  K 3.0* 3.3*  CL 100 107  GLUCOSE 95 110*  BUN 20 13  CALCIUM 9.9 8.6*  CREATININE 1.03* 0.79      Results for orders placed or performed during the hospital encounter of 06/27/18 (from the past 24 hour(s))  Lipase, blood     Status: Abnormal   Collection Time: 06/27/18  4:02 PM  Result Value Ref Range   Lipase 174 (H) 11 - 51 U/L  Comprehensive metabolic panel     Status: Abnormal   Collection Time: 06/27/18  4:02 PM  Result Value Ref Range   Sodium 138 135 - 145 mmol/L   Potassium 3.0 (L) 3.5 - 5.1 mmol/L   Chloride 100 98 - 111 mmol/L   CO2 26 22 - 32  mmol/L   Glucose, Bld 95 70 - 99 mg/dL   BUN 20 6 - 20 mg/dL   Creatinine, Ser 1.03 (H) 0.44 - 1.00 mg/dL   Calcium 9.9 8.9 - 10.3 mg/dL   Total Protein 7.5 6.5 - 8.1 g/dL   Albumin 3.6 3.5 - 5.0 g/dL   AST 44 (H) 15 - 41 U/L   ALT 90 (H) 0 - 44 U/L   Alkaline Phosphatase 56 38 - 126 U/L   Total Bilirubin 0.8 0.3 - 1.2 mg/dL   GFR calc non Af Amer >60 >60 mL/min   GFR calc Af Amer >60 >60 mL/min   Anion gap 12 5 - 15  CBC with Differential     Status: Abnormal   Collection Time: 06/27/18  4:58 PM  Result Value Ref Range   WBC 8.2 4.0 - 10.5 K/uL   RBC 5.99 (H) 3.87 - 5.11 MIL/uL   Hemoglobin 14.0 12.0 - 15.0 g/dL   HCT 44.6 36.0 - 46.0 %   MCV 74.5 (L) 80.0 - 100.0 fL   MCH 23.4 (L) 26.0 - 34.0 pg   MCHC 31.4 30.0 - 36.0 g/dL   RDW 14.6 11.5 - 15.5 %   Platelets 301 150 - 400 K/uL   nRBC 0.0 0.0 - 0.2 %   Neutrophils Relative % 59 %   Neutro Abs 4.9 1.7 - 7.7 K/uL   Lymphocytes Relative 25 %   Lymphs Abs 2.0 0.7 - 4.0 K/uL   Monocytes Relative 12 %   Monocytes Absolute 1.0 0.1 - 1.0 K/uL   Eosinophils Relative 2 %   Eosinophils Absolute 0.2 0.0 - 0.5 K/uL   Basophils Relative 1 %   Basophils Absolute 0.0 0.0 - 0.1 K/uL   Immature Granulocytes 1 %   Abs Immature Granulocytes 0.05 0.00 - 0.07 K/uL  I-Stat Troponin, ED (not at [facility])     Status: None   Collection Time: 06/27/18  5:23 PM  Result Value Ref Range   Troponin i, poc 0.00 0.00 - 0.08 ng/mL   Comment 3          I-Stat CG4 Lactic Acid, ED      Status: None   Collection Time: 06/27/18  5:24 PM  Result Value Ref Range   Lactic Acid, Venous 1.12 0.5 - 1.9 mmol/L  Urinalysis, Routine w reflex microscopic     Status: Abnormal   Collection Time: 06/27/18 10:20 PM  Result Value Ref Range   Color, Urine YELLOW YELLOW   APPearance CLEAR CLEAR   Specific Gravity, Urine >1.046 (H) 1.005 - 1.030   pH 6.0 5.0 - 8.0   Glucose, UA NEGATIVE NEGATIVE mg/dL   Hgb urine dipstick NEGATIVE NEGATIVE   Bilirubin Urine NEGATIVE NEGATIVE   Ketones, ur 20 (A) NEGATIVE mg/dL   Protein, ur NEGATIVE NEGATIVE mg/dL   Nitrite NEGATIVE NEGATIVE   Leukocytes, UA TRACE (A) NEGATIVE   RBC / HPF 0-5 0 - 5 RBC/hpf   WBC, UA 6-10 0 - 5 WBC/hpf   Bacteria, UA NONE SEEN NONE SEEN   Squamous Epithelial / LPF 0-5 0 - 5   Mucus PRESENT   Comprehensive metabolic panel     Status: Abnormal   Collection Time: 06/28/18  4:49 AM  Result Value Ref Range   Sodium 138 135 - 145 mmol/L   Potassium 3.3 (L) 3.5 - 5.1 mmol/L   Chloride 107 98 - 111 mmol/L   CO2 22 22 - 32 mmol/L   Glucose, Bld 110 (  H) 70 - 99 mg/dL   BUN 13 6 - 20 mg/dL   Creatinine, Ser 0.79 0.44 - 1.00 mg/dL   Calcium 8.6 (L) 8.9 - 10.3 mg/dL   Total Protein 5.8 (L) 6.5 - 8.1 g/dL   Albumin 2.9 (L) 3.5 - 5.0 g/dL   AST 33 15 - 41 U/L   ALT 69 (H) 0 - 44 U/L   Alkaline Phosphatase 40 38 - 126 U/L   Total Bilirubin 0.6 0.3 - 1.2 mg/dL   GFR calc non Af Amer >60 >60 mL/min   GFR calc Af Amer >60 >60 mL/min   Anion gap 9 5 - 15  Magnesium     Status: None   Collection Time: 06/28/18  4:49 AM  Result Value Ref Range   Magnesium 2.0 1.7 - 2.4 mg/dL  Lipase, blood     Status: Abnormal   Collection Time: 06/28/18  4:49 AM  Result Value Ref Range   Lipase 137 (H) 11 - 51 U/L   No results found for this or any previous visit (from the past 240 hour(s)).  Renal Function: Recent Labs    06/23/18 0508 06/27/18 1602 06/28/18 0449  CREATININE 0.82 1.03* 0.79   Estimated Creatinine Clearance:  111 mL/min (by C-G formula based on SCr of 0.79 mg/dL).  Radiologic Imaging: Dg Chest 2 View  Result Date: 06/27/2018 CLINICAL DATA:  Daily vomiting and diarrhea after recent gastric bypass surgery. EXAM: CHEST - 2 VIEW COMPARISON:  Chest radiograph May 20, 2018 FINDINGS: Cardiomediastinal silhouette is normal. No pleural effusions or focal consolidations. Trachea projects midline and there is no pneumothorax. Soft tissue planes and included osseous structures are non-suspicious. IMPRESSION: Normal chest radiograph. Electronically Signed   By: Elon Alas M.D.   On: 06/27/2018 18:05   Ct Abdomen Pelvis W Contrast  Result Date: 06/27/2018 CLINICAL DATA:  52 year old female with history of a gastric bypass on 06/22/2018. Daily vomiting since the time of surgery. Diarrhea and abdominal cramping today. EXAM: CT ABDOMEN AND PELVIS WITH CONTRAST TECHNIQUE: Multidetector CT imaging of the abdomen and pelvis was performed using the standard protocol following bolus administration of intravenous contrast. CONTRAST:  156mL ISOVUE-300 IOPAMIDOL (ISOVUE-300) INJECTION 61% COMPARISON:  CT the abdomen and pelvis 05/13/2018. FINDINGS: Lower chest: Unremarkable. Hepatobiliary: No cystic or solid hepatic lesions. No intra or extrahepatic biliary ductal dilatation. Gallbladder is normal in appearance. Pancreas: No pancreatic mass. No pancreatic ductal dilatation. No pancreatic or peripancreatic fluid or inflammatory changes. Spleen: Unremarkable. Adrenals/Urinary Tract: Multiple nonobstructive calculi are noted within the left renal collecting system measuring 3-4 mm in size. In addition, at the left ureteropelvic junction there is a cluster of 3 small calculi measuring 2-3 mm in size best appreciated on coronal image 64 of series 6, with mild proximal hydronephrosis. Right kidney and bilateral adrenal glands are normal in appearance. No right-sided hydroureteronephrosis. Urinary bladder is normal in appearance.  Stomach/Bowel: Status post Roux-en-Y gastric bypass. No pathologic dilatation of small bowel or colon. Normal appendix. Vascular/Lymphatic: Mild atherosclerotic calcifications noted throughout the pelvic vasculature. No lymphadenopathy noted in the abdomen or pelvis. Reproductive: Uterus is heterogeneous in appearance suggesting the presence of multiple small fibroids. Ovaries are unremarkable in appearance. Other: No significant volume of ascites.  No pneumoperitoneum. Musculoskeletal: There are no aggressive appearing lytic or blastic lesions noted in the visualized portions of the skeleton. IMPRESSION: 1. There is a cluster of 3 small 2-3 mm calculi at the left ureteropelvic junction with mild proximal hydronephrosis indicative of mild obstruction  at this time. 2. Multiple additional nonobstructive calculi are noted in the collecting system of the left kidney measuring 2-3 mm in size. 3. Status post Roux-en-Y gastric bypass. Electronically Signed   By: Vinnie Langton M.D.   On: 06/27/2018 20:22    I independently reviewed the above imaging studies.  Impression/Recommendation Multiple obstructing left UPJ calculi Nausea/vomiting-likely secondary to her obstructing left UPJ stones  -The risks, benefits and alternatives of cystoscopy with LEFT JJ stent exchange was discussed with the patient.  Risks include, but are not limited to: bleeding, urinary tract infection, ureteral injury, ureteral stricture disease, chronic pain, urinary symptoms, bladder injury, stent migration, the need for nephrostomy tube placement, MI, CVA, DVT, PE and the inherent risks with general anesthesia.  The patient voices understanding and wishes to proceed.   Ellison Hughs, MD [facility] 06/28/2018, 12:35 PM

## 2018-06-29 ENCOUNTER — Encounter (HOSPITAL_COMMUNITY): Payer: Self-pay | Admitting: Urology

## 2018-06-29 LAB — BASIC METABOLIC PANEL
ANION GAP: 5 (ref 5–15)
BUN: 7 mg/dL (ref 6–20)
CHLORIDE: 107 mmol/L (ref 98–111)
CO2: 25 mmol/L (ref 22–32)
CREATININE: 0.78 mg/dL (ref 0.44–1.00)
Calcium: 8.9 mg/dL (ref 8.9–10.3)
GFR calc non Af Amer: >60 mL/min (ref >60)
Glucose, Bld: 144 mg/dL — ABNORMAL HIGH (ref 70–99)
Potassium: 4 mmol/L (ref 3.5–5.1)
Sodium: 137 mmol/L (ref 135–145)

## 2018-06-29 LAB — MAGNESIUM: Magnesium: 1.8 mg/dL (ref 1.7–2.4)

## 2018-06-29 MED ORDER — SUCRALFATE 1 GM/10ML PO SUSP: 1.0000 g | Freq: Three times a day (TID) | ORAL | 0 refills | Status: DC

## 2018-06-29 MED ORDER — HYDROCHLOROTHIAZIDE 25 MG PO TABS
25.0000 mg | ORAL_TABLET | Freq: Every day | ORAL | Status: DC
  Administered 2018-06-29: 25 mg via ORAL
  Filled 2018-06-29: qty 1

## 2018-06-29 MED ORDER — TRAMADOL HCL 50 MG PO TABS: 50.0000 mg | ORAL_TABLET | Freq: Four times a day (QID) | ORAL | 0 refills | Status: DC | PRN

## 2018-06-29 NOTE — Progress Notes (Signed)
Patient alert and oriented.  Drinking clear liquids without difficulty.  Ordered bari full tray.  Discussed bari vitamins and calcium.  We discussed calcium citrate versus carbonate with the history of kidney stones.  No questions at this time.  Will continue to monitor.

## 2018-06-29 NOTE — Discharge Summary (Addendum)
Physician Discharge Summary  Mary Sanford VCB:[id number] DOB: Jul 05, 1966 DOA: 06/27/2018  PCP: [name], MD  Admit date: 06/27/2018 Discharge date: 06/29/2018  Recommendations for Outpatient Follow-up:    Follow-up Information    Ceasar Mons, MD.   Specialty:  Urology Why:  My office will call to schedule you next kidney stone surgery, which will be in the next 2-3 weeks.  Contact information: [place] [address] [phone]        Greer Pickerel, MD Follow up on 07/21/2018.   Specialty:  General Surgery Why:  11:30 AM; pls arrive at 11:15 AM Contact information: [place] [address] [phone]        Carlena Hurl, PA-C Follow up on 08/05/2018.   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  Cystoscopy with left JJ stent placement - Dr Winter 2.  Left retrograde pyelogram with intraoperative interpretation of fluoroscopic imaging  Discharge Condition: Good Disposition: Home  Diet recommendation: Postoperative gastric bypass diet - full liquids  Filed Weights   06/28/18 0300 06/29/18 0559  Weight: 112 kg 114.1 kg     Hospital Course:  The patient came back to the emergency room on Sunday with complaints of epigastric pain, trouble tolerating protein shakes and weakness.  She was found to have some mild dehydration.  She had hypokalemia.  She underwent a CT scan of her abdomen pelvis.  There is no evidence of complications with respect to her gastric bypass.  There is no evidence of a leak, free air or upper abdominal abscess.  She was found to have evidence of progression of her  known left kidney stones now causing some mild hydronephrosis.  She was admitted and started on bariatric clear liquids as well as scheduled Carafate.  Her epigastric pain was improved and she was tolerating liquids.  Urology was consulted for the left kidney stones.  She was taken to surgery by Dr. Lovena Neighbours on Monday for cystoscopy with a left JJ stent placement.  Postoperatively she is done well.  Oral intake is significantly improved.  She was tolerating bariatric full liquids.  She was deemed stable for discharge.  She underwent additional dietary counseling as well as reeducation about postoperative bariatric care plan.  We will send her out on scheduled Carafate to help with oral intake.  BP (!) 142/90 (BP Location: Right Arm)   Pulse (!) 57   Temp 97.9 F (36.6 C) (Oral)   Resp 18   Ht 5\' 9"  (1.753 m)   Wt 114.1 kg   SpO2 100%   BMI 37.15 kg/m   Gen: alert, NAD, non-toxic appearing Pupils: equal, no scleral icterus Pulm: Lungs clear to auscultation, symmetric chest rise CV: regular rate and rhythm Abd: soft, min tender, nondistended. No cellulitis. No incisional hernia Ext: no edema, no calf tenderness Skin: no rash, no jaundice  Discharge Instructions  Discharge Instructions    Ambulate hourly while awake   Complete by:  As directed    Call MD for:  difficulty breathing, headache or visual disturbances   Complete by:  As directed  Call MD for:  persistant dizziness or light-headedness   Complete by:  As directed    Call MD for:  persistant nausea and vomiting   Complete by:  As directed    Call MD for:  redness, tenderness, or signs of infection (pain, swelling, redness, odor or green/yellow discharge around incision site)   Complete by:  As directed    Call MD for:  severe uncontrolled pain   Complete by:  As directed    Call MD for:  temperature >101 F   Complete by:  As directed    Diet bariatric full liquid   Complete by:  As directed    Discharge instructions    Complete by:  As directed    See bariatric discharge instructions   Incentive spirometry   Complete by:  As directed    Perform hourly while awake     Allergies as of 06/29/2018      Reactions   Naproxen Other (See Comments)   Panic attacks      Medication List    STOP taking these medications   metoCLOPramide 10 MG tablet Commonly known as:  REGLAN     TAKE these medications   acetaminophen 500 MG tablet Commonly known as:  TYLENOL Take 1,000 mg by mouth every 6 (six) hours as needed for mild pain.   albuterol 108 (90 Base) MCG/ACT inhaler Commonly known as:  PROVENTIL HFA;VENTOLIN HFA Inhale 1-2 puffs into the lungs every 6 (six) hours as needed for wheezing or shortness of breath.   escitalopram 10 MG tablet Commonly known as:  LEXAPRO Take 1 tablet (10 mg total) by mouth daily.   hydrochlorothiazide 25 MG tablet Commonly known as:  HYDRODIURIL TAKE 1 TABLET BY MOUTH ONCE DAILY   nebivolol 5 MG tablet Commonly known as:  BYSTOLIC Take 1 tablet (5 mg total) by mouth daily.   ondansetron 4 MG disintegrating tablet Commonly known as:  ZOFRAN-ODT Take 1 tablet (4 mg total) by mouth every 6 (six) hours as needed for nausea or vomiting.   pantoprazole 40 MG tablet Commonly known as:  PROTONIX Take 1 tablet (40 mg total) by mouth daily.   potassium chloride 10 MEQ tablet Commonly known as:  K-DUR,KLOR-CON Take 1 tablet (10 mEq total) by mouth 2 (two) times daily. What changed:  when to take this   sucralfate 1 GM/10ML suspension Commonly known as:  CARAFATE Take 10 mLs (1 g total) by mouth 4 (four) times daily -  with meals and at bedtime.   traMADol 50 MG tablet Commonly known as:  ULTRAM Take 1 tablet (50 mg total) by mouth every 6 (six) hours as needed for severe pain.      Follow-up Information    Ceasar Mons, MD.   Specialty:  Urology Why:  My office will call to schedule you next kidney stone surgery, which will be in the next 2-3  weeks.  Contact information: [place] [address] [phone]        Greer Pickerel, MD Follow up on 07/21/2018.   Specialty:  General Surgery Why:  11:30 AM; pls arrive at 11:15 AM Contact information: [place] [address] [phone]        Carlena Hurl, PA-C Follow up on 08/05/2018.   Specialty:  General Surgery Why:  9 AM; pls arrive at 8:45 AM Contact information: [place] [address] [phone]  The results of significant diagnostics from this hospitalization (including imaging, microbiology, ancillary and laboratory) are listed below for reference.    Significant Diagnostic Studies: Dg Chest 2 View  Result Date: 06/27/2018 CLINICAL DATA:  Daily vomiting and diarrhea after recent gastric bypass surgery. EXAM: CHEST - 2 VIEW COMPARISON:  Chest radiograph May 20, 2018 FINDINGS: Cardiomediastinal silhouette is normal. No pleural effusions or focal consolidations. Trachea projects midline and there is no pneumothorax. Soft tissue planes and included osseous structures are non-suspicious. IMPRESSION: Normal chest radiograph. Electronically Signed   By: Elon Alas M.D.   On: 06/27/2018 18:05   Ct Abdomen Pelvis W Contrast  Result Date: 06/27/2018 CLINICAL DATA:  52 year old female with history of a gastric bypass on 06/22/2018. Daily vomiting since the time of surgery. Diarrhea and abdominal cramping today. EXAM: CT ABDOMEN AND PELVIS WITH CONTRAST TECHNIQUE: Multidetector CT imaging of the abdomen and pelvis was performed using the standard protocol following bolus administration of intravenous contrast. CONTRAST:  161mL ISOVUE-300 IOPAMIDOL (ISOVUE-300) INJECTION 61% COMPARISON:  CT the abdomen and pelvis 05/13/2018. FINDINGS: Lower chest: Unremarkable. Hepatobiliary: No cystic or solid hepatic lesions. No intra or extrahepatic biliary ductal dilatation. Gallbladder is normal  in appearance. Pancreas: No pancreatic mass. No pancreatic ductal dilatation. No pancreatic or peripancreatic fluid or inflammatory changes. Spleen: Unremarkable. Adrenals/Urinary Tract: Multiple nonobstructive calculi are noted within the left renal collecting system measuring 3-4 mm in size. In addition, at the left ureteropelvic junction there is a cluster of 3 small calculi measuring 2-3 mm in size best appreciated on coronal image 64 of series 6, with mild proximal hydronephrosis. Right kidney and bilateral adrenal glands are normal in appearance. No right-sided hydroureteronephrosis. Urinary bladder is normal in appearance. Stomach/Bowel: Status post Roux-en-Y gastric bypass. No pathologic dilatation of small bowel or colon. Normal appendix. Vascular/Lymphatic: Mild atherosclerotic calcifications noted throughout the pelvic vasculature. No lymphadenopathy noted in the abdomen or pelvis. Reproductive: Uterus is heterogeneous in appearance suggesting the presence of multiple small fibroids. Ovaries are unremarkable in appearance. Other: No significant volume of ascites.  No pneumoperitoneum. Musculoskeletal: There are no aggressive appearing lytic or blastic lesions noted in the visualized portions of the skeleton. IMPRESSION: 1. There is a cluster of 3 small 2-3 mm calculi at the left ureteropelvic junction with mild proximal hydronephrosis indicative of mild obstruction at this time. 2. Multiple additional nonobstructive calculi are noted in the collecting system of the left kidney measuring 2-3 mm in size. 3. Status post Roux-en-Y gastric bypass. Electronically Signed   By: Vinnie Langton M.D.   On: 06/27/2018 20:22   Dg C-arm 1-60 Min-no Report  Result Date: 06/28/2018 Fluoroscopy was utilized by the requesting physician.  No radiographic interpretation.    Labs: Basic Metabolic Panel: Recent Labs  Lab 06/23/18 0508 06/27/18 1602 06/28/18 0449 06/29/18 0350  NA 138 138 138 137  K 3.7 3.0*  3.3* 4.0  CL 105 100 107 107  CO2 24 26 22 25   GLUCOSE 128* 95 110* 144*  BUN 12 20 13 7   CREATININE 0.82 1.03* 0.79 0.78  CALCIUM 9.1 9.9 8.6* 8.9  MG  --   --  2.0 1.8   Liver Function Tests: Recent Labs  Lab 06/23/18 0508 06/27/18 1602 06/28/18 0449  AST 63* 44* 33  ALT 79* 90* 69*  ALKPHOS 40 56 40  BILITOT 0.6 0.8 0.6  PROT 6.1* 7.5 5.8*  ALBUMIN 3.1* 3.6 2.9*    CBC: Recent Labs  Lab 06/23/18 0508 06/27/18 1658  WBC  15.4* 8.2  NEUTROABS 11.8* 4.9  HGB 11.6* 14.0  HCT 38.5 44.6  MCV 77.0* 74.5*  PLT 236 301    CBG: No results for input(s): GLUCAP in the last 168 hours.  Active Problems:   Nausea & vomiting   Time coordinating discharge: 20 min  Signed:  Gayland Curry, MD [facility], [place] [phone] 06/29/2018, 3:21 PM

## 2018-06-29 NOTE — Progress Notes (Signed)
Pt alert and oriented.  D/C instructions given by Parks Neptune.  No questions. Pt d/cd home.

## 2018-06-29 NOTE — Discharge Instructions (Signed)
° ° ° °GASTRIC BYPASS/SLEEVE ° Home Care Instructions ° ° These instructions are to help you care for yourself when you go home. ° °Call: If you have any problems. °• Call [phone] and ask for the surgeon on call °• If you need immediate help, come to the ER at [place].  °• Tell the ER staff that you are a new post-op gastric bypass or gastric sleeve patient °  °Signs and symptoms to report: • Severe vomiting or nausea °o If you cannot keep down clear liquids for longer than 1 day, call your surgeon  °• Abdominal pain that does not get better after taking your pain medication °• Fever over 100.4° F with chills °• Heart beating over 100 beats a minute °• Shortness of breath at rest °• Chest pain °•  Redness, swelling, drainage, or foul odor at incision (surgical) sites °•  If your incisions open or pull apart °• Swelling or pain in calf (lower leg) °• Diarrhea (Loose bowel movements that happen often), frequent watery, uncontrolled bowel movements °• Constipation, (no bowel movements for 3 days) if this happens: Pick one °o Milk of Magnesia, 2 tablespoons by mouth, 3 times a day for 2 days if needed °o Stop taking Milk of Magnesia once you have a bowel movement °o Call your doctor if constipation continues °Or °o Miralax  (instead of Milk of Magnesia) following the label instructions °o Stop taking Miralax once you have a bowel movement °o Call your doctor if constipation continues °• Anything you think is not normal °  °Normal side effects after surgery: • Unable to sleep at night or unable to focus °• Irritability or moody °• Being tearful (crying) or depressed °These are common complaints, possibly related to your anesthesia medications that put you to sleep, stress of surgery, and change in lifestyle.  This usually goes away a few weeks after surgery.  If these feelings continue, call your primary care doctor. °  °Wound Care: You may have surgical glue, steri-strips, or staples over your incisions after  surgery °• Surgical glue:  Looks like a clear film over your incisions and will wear off a little at a time °• Steri-strips: Strips of tape over your incisions. You may notice a yellowish color on the skin under the steri-strips. This is used to make the   steri-strips stick better. Do not pull the steri-strips off - let them fall off °• Staples: Staples may be removed before you leave the hospital °o If you go home with staples, call [facility], [phone] at for an appointment with your surgeon’s nurse to have staples removed 10 days after surgery. °• Showering: You may shower two (2) days after your surgery unless your surgeon tells you differently °o Wash gently around incisions with warm soapy water, rinse well, and gently pat dry  °o No tub baths until staples are removed, steri-strips fall off or glue is gone.  °  °Medications: • Medications should be liquid or crushed if larger than the size of a dime °• Extended release pills (medication that release a little bit at a time through the day) should NOT be crushed or cut. (examples include XL, ER, DR, SR) °• Depending on the size and number of medications you take, you may need to space (take a few throughout the day)/change the time you take your medications so that you do not over-fill your pouch (smaller stomach) °• Make sure you follow-up with your primary care doctor to   make medication changes needed during rapid weight loss and life-style changes °• If you have diabetes, follow up with the doctor that orders your diabetes medication(s) within one week after surgery and check your blood sugar regularly. °• Do not drive while taking prescription pain medication  °• It is ok to take Tylenol by the bottle instructions with your pain medicine or instead of your pain medicine as needed.  DO NOT TAKE NSAIDS (EXAMPLES OF NSAIDS:  IBUPROFREN/ NAPROXEN)  °Diet:                    First 2 Weeks ° You will see the dietician t about two (2) weeks  after your surgery. The dietician will increase the types of foods you can eat if you are handling liquids well: °• If you have severe vomiting or nausea and cannot keep down clear liquids lasting longer than 1 day, call your surgeon @ ([phone]) °Protein Shake °• Drink at least 2 ounces of shake 5-6 times per day °• Each serving of protein shakes (usually 8 - 12 ounces) should have: °o 15 grams of protein  °o And no more than 5 grams of carbohydrate  °• Goal for protein each day: °o Men = 80 grams per day °o Women = 60 grams per day °• Protein powder may be added to fluids such as non-fat milk or Lactaid milk or unsweetened Soy/Almond milk (limit to 35 grams added protein powder per serving) ° °Hydration °• Slowly increase the amount of water and other clear liquids as tolerated (See Acceptable Fluids) °• Slowly increase the amount of protein shake as tolerated  °•  Sip fluids slowly and throughout the day.  Do not use straws. °• May use sugar substitutes in small amounts (no more than 6 - 8 packets per day; i.e. Splenda) ° °Fluid Goal °• The first goal is to drink at least 8 ounces of protein shake/drink per day (or as directed by the nutritionist); some examples of protein shakes are Syntrax Nectar, Adkins Advantage, EAS Edge HP, and Unjury. See handout from pre-op Bariatric Education Class: °o Slowly increase the amount of protein shake you drink as tolerated °o You may find it easier to slowly sip shakes throughout the day °o It is important to get your proteins in first °• Your fluid goal is to drink 64 - 100 ounces of fluid daily °o It may take a few weeks to build up to this °• 32 oz (or more) should be clear liquids  °And  °• 32 oz (or more) should be full liquids (see below for examples) °• Liquids should not contain sugar, caffeine, or carbonation ° °Clear Liquids: °• Water or Sugar-free flavored water (i.e. Fruit H2O, Propel) °• Decaffeinated coffee or tea (sugar-free) °• Crystal Lite, Wyler’s Lite,  Minute Maid Lite °• Sugar-free Jell-O °• Bouillon or broth °• Sugar-free Popsicle:   *Less than 20 calories each; Limit 1 per day ° °Full Liquids: °Protein Shakes/Drinks + 2 choices per day of other full liquids °• Full liquids must be: °o No More Than 15 grams of Carbs per serving  °o No More Than 3 grams of Fat per serving °• Strained low-fat cream soup (except Cream of Potato or Tomato) °• Non-Fat milk °• Fat-free Lactaid Milk °• Unsweetened Soy Or Unsweetened Almond Milk °• Low Sugar yogurt (Dannon Lite & Fit, Greek yogurt; Oikos Triple Zero; Chobani Simply 100; Yoplait 100 calorie Greek - No Fruit on the Bottom) ° °  °Vitamins   and Minerals • Start 1 day after surgery unless otherwise directed by your surgeon °• 2 Chewable Bariatric Specific Multivitamin / Multimineral Supplement with iron (Example: Bariatric Advantage Multi EA) °• Chewable Calcium with Vitamin D-3 °(Example: 3 Chewable Calcium Plus 600 with Vitamin D-3) °o Take 500 mg three (3) times a day for a total of 1500 mg each day °o Do not take all 3 doses of calcium at one time as it may cause constipation, and you can only absorb 500 mg  at a time  °o Do not mix multivitamins containing iron with calcium supplements; take 2 hours apart °• Menstruating women and those with a history of anemia (a blood disease that causes weakness) may need extra iron °o Talk with your doctor to see if you need more iron °• Do not stop taking or change any vitamins or minerals until you talk to your dietitian or surgeon °• Your Dietitian and/or surgeon must approve all vitamin and mineral supplements °  °Activity and Exercise: Limit your physical activity as instructed by your doctor.  It is important to continue walking at home.  During this time, use these guidelines: °• Do not lift anything greater than ten (10) pounds for at least two (2) weeks °• Do not go back to work or drive until your surgeon says you can °• You may have sex when you feel comfortable  °o It is  VERY important for female patients to use a reliable birth control method; fertility often increases after surgery  °o All hormonal birth control will be ineffective for 30 days after surgery due to medications given during surgery a barrier method must be used. °o Do not get pregnant for at least 18 months °• Start exercising as soon as your doctor tells you that you can °o Make sure your doctor approves any physical activity °• Start with a simple walking program °• Walk 5-15 minutes each day, 7 days per week.  °• Slowly increase until you are walking 30-45 minutes per day °Consider joining our BELT program. [phone] or email belt@[employer] °  °Special Instructions Things to remember: °• Use your CPAP when sleeping if this applies to you ° °• [facility] has two free Bariatric Surgery Support Groups that meet monthly °o The 3rd Thursday of each month, 6 pm, [facility]  °o The 2nd Friday of each month, 11:45 am in the private dining room in the basement of [place] °• It is very important to keep all follow up appointments with your surgeon, dietitian, primary care physician, and behavioral health practitioner °• Routine follow up schedule with your surgeon include appointments at 2-3 weeks, 6-8 weeks, 6 months, and 1 year at a minimum.  Your surgeon may request to see you more often.   °o After the first year, please follow up with your bariatric surgeon and dietitian at least once a year in order to maintain best weight loss results °[facility]: [phone] °[facility]: [phone] °Bariatric Nurse Coordinator: [phone] °  °   Reviewed and Endorsed  °by [facility] Patient Education Committee, June, 2016 °Edits Approved: Aug, 2018 ° ° ° °

## 2018-06-29 NOTE — Anesthesia Postprocedure Evaluation (Signed)
Anesthesia Post Note  Patient: Mary Sanford  Procedure(s) Performed: CYSTOSCOPY WITH RETROGRADE PYELOGRAM LEFT STENT (Left )     Patient location during evaluation: PACU Anesthesia Type: General Level of consciousness: awake and alert Pain management: pain level controlled Vital Signs Assessment: post-procedure vital signs reviewed and stable Respiratory status: spontaneous breathing, nonlabored ventilation, respiratory function stable and patient connected to nasal cannula oxygen Cardiovascular status: blood pressure returned to baseline and stable Postop Assessment: no apparent nausea or vomiting Anesthetic complications: no    Last Vitals:  Vitals:   06/29/18 0557 06/29/18 0917  BP: (!) 150/83 (!) 174/97  Pulse: 68 (!) 56  Resp: 16   Temp: 36.6 C 36.8 C  SpO2: 96% 99%    Last Pain:  Vitals:   06/29/18 0917  TempSrc: Oral  PainSc:                  Otha Monical S

## 2018-06-29 NOTE — Progress Notes (Signed)
Talked with Dr. Lucia Gaskins about pt's concern for her blood pressure.  It was elevated when taken with a dinamap but was 150/92 manually.  Pt is asymptomatic and was instructed to follow up tomorrow with her primary care physician. Also pt was instructed to call or return to the ED if symptoms occur.

## 2018-06-29 NOTE — Progress Notes (Signed)
Nutrition Brief Note  RD consulted for bariatric diet teaching. The patient's initial teaching was completed on 11/20.   Pt had a hard time tolerating Premier at home due to ongoing vomiting after each consumption. Discussed other protein options like Unjury Soup or unflavored protein powder pt could put in other food items. Reiterated protein goal of 60 grams and fluid goal of 64 oz.   Pt tolerated cream of chicken soup this morning without resulting n/v.   Pt has not purchased bariatric MVI or calcium. Husband at bedside was getting ready to leave for [facility] pharmacy to purchase vitamins and Unjury starter pack.   Mariana Single RD, LDN Clinical Nutrition Pager # [phone]

## 2018-06-29 NOTE — Progress Notes (Signed)
Reviewed discharge instructions with patient including gastric bypass home instructions, medication changes, and follow up appointments.  Patient reminded to follow up with primary doctor.  Patient able to discuss dietary needs including protein and fluid intake.  MVI and Calcium scheduled reviewed.  Questions answered.

## 2018-07-05 ENCOUNTER — Other Ambulatory Visit: Payer: Self-pay | Admitting: Urology

## 2018-07-05 ENCOUNTER — Ambulatory Visit: Payer: Federal, State, Local not specified - PPO | Admitting: Internal Medicine

## 2018-07-05 ENCOUNTER — Encounter: Payer: Self-pay | Admitting: Internal Medicine

## 2018-07-05 ENCOUNTER — Ambulatory Visit (HOSPITAL_COMMUNITY)
Admission: RE | Admit: 2018-07-05 | Discharge: 2018-07-05 | Disposition: A | Discharge status: Home / Self Care | Payer: Federal, State, Local not specified - PPO | Source: Ambulatory Visit | Attending: General Surgery | Admitting: General Surgery

## 2018-07-05 VITALS — BP 124/76 | HR 71 | Temp 98.7°F | Resp 16 | Ht 69.0 in | Wt 238.5 lb

## 2018-07-05 DIAGNOSIS — I10 Essential (primary) hypertension: Secondary | ICD-10-CM | POA: Diagnosis not present

## 2018-07-05 DIAGNOSIS — R110 Nausea: Secondary | ICD-10-CM | POA: Diagnosis present

## 2018-07-05 DIAGNOSIS — Z23 Encounter for immunization: Secondary | ICD-10-CM | POA: Diagnosis not present

## 2018-07-05 MED ORDER — THIAMINE HCL 100 MG/ML IJ SOLN
INTRAVENOUS | Status: DC
  Administered 2018-07-05: 10:00:00 via INTRAVENOUS
  Filled 2018-07-05 (×4): qty 1000

## 2018-07-05 MED ORDER — ONDANSETRON 4 MG PO TBDP: 4.0000 mg | ORAL_TABLET | ORAL | Status: DC | PRN

## 2018-07-05 MED ORDER — POTASSIUM CHLORIDE IN NACL 20-0.9 MEQ/L-% IV SOLN
INTRAVENOUS | Status: DC
  Administered 2018-07-05: 12:00:00 via INTRAVENOUS
  Filled 2018-07-05 (×2): qty 1000

## 2018-07-05 MED ORDER — ONDANSETRON HCL 4 MG/2ML IJ SOLN: 4.0000 mg | INTRAMUSCULAR | Status: DC | PRN

## 2018-07-05 MED ORDER — POTASSIUM CHLORIDE IN NACL 20-0.9 MEQ/L-% IV SOLN
Freq: Once | INTRAVENOUS | Status: DC
  Filled 2018-07-05: qty 1000

## 2018-07-05 NOTE — Progress Notes (Signed)
Clarified order with Dr. Redmond Pulling for Normal saline after banana bag - we are to infuse the 1L NS with 20 MEQ KCL as ordered.

## 2018-07-05 NOTE — Progress Notes (Signed)
Patient received 1 L. of Banana bag, after which  0.9 % normal saline with KCL 20 meq/ was infused through a PIV as ordered. Tolerated well, vitals stable, discharge instructions given, verbalized understanding. Patient alert, oriented and ambulatory at the time of discharge.

## 2018-07-05 NOTE — Discharge Instructions (Signed)
Dehydration, Adult Dehydration is when there is not enough fluid or water in your body. This happens when you lose more fluids than you take in. Dehydration can range from mild to very bad. It should be treated right away to keep it from getting very bad. Symptoms of mild dehydration may include:  Thirst.  Dry lips.  Slightly dry mouth.  Dry, warm skin.  Dizziness. Symptoms of moderate dehydration may include:  Very dry mouth.  Muscle cramps.  Dark pee (urine). Pee may be the color of tea.  Your body making less pee.  Your eyes making fewer tears.  Heartbeat that is uneven or faster than normal (palpitations).  Headache.  Light-headedness, especially when you stand up from sitting.  Fainting (syncope). Symptoms of very bad dehydration may include:  Changes in skin, such as: ? Cold and clammy skin. ? Blotchy (mottled) or pale skin. ? Skin that does not quickly return to normal after being lightly pinched and let go (poor skin turgor).  Changes in body fluids, such as: ? Feeling very thirsty. ? Your eyes making fewer tears. ? Not sweating when body temperature is high, such as in hot weather. ? Your body making very little pee.  Changes in vital signs, such as: ? Weak pulse. ? Pulse that is more than 100 beats a minute when you are sitting still. ? Fast breathing. ? Low blood pressure.  Other changes, such as: ? Sunken eyes. ? Cold hands and feet. ? Confusion. ? Lack of energy (lethargy). ? Trouble waking up from sleep. ? Short-term weight loss. ? Unconsciousness. Follow these instructions at home:  If told by your doctor, drink an ORS: ? Make an ORS by using instructions on the package. ? Start by drinking small amounts, about  cup (120 mL) every 5-10 minutes. ? Slowly drink more until you have had the amount that your doctor said to have.  Drink enough clear fluid to keep your pee clear or pale yellow. If you were told to drink an ORS, finish the ORS  first, then start slowly drinking clear fluids. Drink fluids such as: ? Water. Do not drink only water by itself. Doing that can make the salt (sodium) level in your body get too low (hyponatremia). ? Ice chips. ? Fruit juice that you have added water to (diluted). ? Low-calorie sports drinks.  Avoid: ? Alcohol. ? Drinks that have a lot of sugar. These include high-calorie sports drinks, fruit juice that does not have water added, and soda. ? Caffeine. ? Foods that are greasy or have a lot of fat or sugar.  Take over-the-counter and prescription medicines only as told by your doctor.  Do not take salt tablets. Doing that can make the salt level in your body get too high (hypernatremia).  Eat foods that have minerals (electrolytes). Examples include bananas, oranges, potatoes, tomatoes, and spinach.  Keep all follow-up visits as told by your doctor. This is important. Contact a doctor if:  You have belly (abdominal) pain that: ? Gets worse. ? Stays in one area (localizes).  You have a rash.  You have a stiff neck.  You get angry or annoyed more easily than normal (irritability).  You are more sleepy than normal.  You have a harder time waking up than normal.  You feel: ? Weak. ? Dizzy. ? Very thirsty.  You have peed (urinated) only a small amount of very dark pee during 6-8 hours. Get help right away if:  You have symptoms of  very bad dehydration.  You cannot drink fluids without throwing up (vomiting).  Your symptoms get worse with treatment.  You have a fever.  You have a very bad headache.  You are throwing up or having watery poop (diarrhea) and it: ? Gets worse. ? Does not go away.  You have blood or something green (bile) in your throw-up.  You have blood in your poop (stool). This may cause poop to look black and tarry.  You have not peed in 6-8 hours.  You pass out (faint).  Your heart rate when you are sitting still is more than 100 beats a  minute.  You have trouble breathing. This information is not intended to replace advice given to you by your health care provider. Make sure you discuss any questions you have with your health care provider. Document Released: 05/17/2009 Document Revised: 02/08/2016 Document Reviewed: 09/14/2015 Elsevier Interactive Patient Education  2018 [employer].   Patient infused with 1 L of Banana bag. Patient also infused with 1 L 0.9 Nacl, KCL 20 meq / L . Vital signs obtained pre and post infusions.

## 2018-07-05 NOTE — Patient Instructions (Signed)

## 2018-07-05 NOTE — Progress Notes (Signed)
Subjective:  Patient ID: Mary Sanford, female    DOB: 28-Sep-1965  Age: 52 y.o. MRN: [id number]  CC: Hypertension   HPI Mary Sanford presents for a BP check - She is about 2 weeks postop from bariatric surgery.  Her surgery was complicated by postop kidney stones with nausea, vomiting, and dehydration.  She tells me she received IV fluids earlier today.  She complains of fatigue, dizziness, and lightheadedness.  Outpatient Medications Prior to Visit  Medication Sig Dispense Refill  . acetaminophen (TYLENOL) 500 MG tablet Take 1,000 mg by mouth every 6 (six) hours as needed for mild pain.     Marland Kitchen albuterol (PROVENTIL HFA;VENTOLIN HFA) 108 (90 Base) MCG/ACT inhaler Inhale 1-2 puffs into the lungs every 6 (six) hours as needed for wheezing or shortness of breath. 1 Inhaler 0  . escitalopram (LEXAPRO) 10 MG tablet Take 1 tablet (10 mg total) by mouth daily. 90 tablet 1  . nebivolol (BYSTOLIC) 5 MG tablet Take 1 tablet (5 mg total) by mouth daily. 30 tablet 0  . potassium chloride (K-DUR,KLOR-CON) 10 MEQ tablet Take 1 tablet (10 mEq total) by mouth 2 (two) times daily. (Patient taking differently: Take 10 mEq by mouth daily. ) 180 tablet 1  . hydrochlorothiazide (HYDRODIURIL) 25 MG tablet TAKE 1 TABLET BY MOUTH ONCE DAILY (Patient taking differently: Take 25 mg by mouth daily. ) 90 tablet 1  . ondansetron (ZOFRAN-ODT) 4 MG disintegrating tablet Take 1 tablet (4 mg total) by mouth every 6 (six) hours as needed for nausea or vomiting. 20 tablet 0  . pantoprazole (PROTONIX) 40 MG tablet Take 1 tablet (40 mg total) by mouth daily. (Patient not taking: Reported on 06/27/2018) 30 tablet 0  . sucralfate (CARAFATE) 1 GM/10ML suspension Take 10 mLs (1 g total) by mouth 4 (four) times daily -  with meals and at bedtime. 420 mL 0  . traMADol (ULTRAM) 50 MG tablet Take 1 tablet (50 mg total) by mouth every 6 (six) hours as needed for severe pain. 15 tablet 0   Facility-Administered Medications Prior to  Visit  Medication Dose Route Frequency Provider Last Rate Last Dose  . 0.9 % NaCl with KCl 20 mEq/ L  infusion   Intravenous Once Greer Pickerel, MD      . ondansetron (ZOFRAN-ODT) disintegrating tablet 4 mg  4 mg Oral Q4H PRN Greer Pickerel, MD       Or  . ondansetron [facility]) injection 4 mg  4 mg Intravenous Q4H PRN Greer Pickerel, MD      . sodium chloride 0.9 % 1,000 mL with thiamine 443 mg, folic acid 1 mg, multivitamins adult 10 mL infusion   Intravenous Continuous Greer Pickerel, MD   Stopped at 07/05/18 1200    ROS Review of Systems  Constitutional: Positive for fatigue. Negative for chills, diaphoresis and unexpected weight change.  HENT: Negative.   Eyes: Negative.   Respiratory: Negative.  Negative for cough, chest tightness, shortness of breath and wheezing.   Cardiovascular: Negative for chest pain, palpitations and leg swelling.  Gastrointestinal: Negative for abdominal pain, constipation, diarrhea, nausea and vomiting.  Genitourinary: Negative.  Negative for difficulty urinating.  Musculoskeletal: Negative for arthralgias and myalgias.  Skin: Negative.  Negative for pallor.  Neurological: Positive for dizziness and light-headedness. Negative for weakness and numbness.  Hematological: Negative for adenopathy. Does not bruise/bleed easily.  Psychiatric/Behavioral: Negative.     Objective:  BP 124/76 (BP Location: Left Arm, Patient Position: Sitting, Cuff Size: Large)  Pulse 71   Temp 98.7 F (37.1 C) (Oral)   Resp 16   Ht 5\' 9"  (1.753 m)   Wt 238 lb 8 oz (108.2 kg)   SpO2 97%   BMI 35.22 kg/m   BP Readings from Last 3 Encounters:  07/05/18 124/76  07/05/18 132/68  06/29/18 (!) 150/92    Wt Readings from Last 3 Encounters:  07/05/18 238 lb 8 oz (108.2 kg)  06/29/18 251 lb 9.6 oz (114.1 kg)  06/22/18 247 lb 6 oz (112.2 kg)    Physical Exam  Constitutional: She is oriented to person, place, and time. No distress.  HENT:  Mouth/Throat: Oropharynx is clear and  moist. No oropharyngeal exudate.  Eyes: Conjunctivae are normal. No scleral icterus.  Neck: Normal range of motion. Neck supple. No JVD present. No thyromegaly present.  Cardiovascular: Normal rate, regular rhythm and normal heart sounds.  No murmur heard. Pulmonary/Chest: Effort normal and breath sounds normal. No respiratory distress. She has no wheezes. She has no rhonchi. She has no rales.  Abdominal: Soft. Bowel sounds are normal. She exhibits no mass. There is no hepatosplenomegaly. There is no tenderness.  Musculoskeletal: Normal range of motion. She exhibits no edema, tenderness or deformity.  Lymphadenopathy:    She has no cervical adenopathy.  Neurological: She is alert and oriented to person, place, and time.  Skin: Skin is warm and dry. She is not diaphoretic. No pallor.  Vitals reviewed.   Lab Results  Component Value Date   WBC 8.2 06/27/2018   HGB 14.0 06/27/2018   HCT 44.6 06/27/2018   PLT 301 06/27/2018   GLUCOSE 144 (H) 06/29/2018   CHOL 135 04/01/2017   TRIG 93.0 04/01/2017   HDL 38.30 (L) 04/01/2017   LDLCALC 78 04/01/2017   ALT 69 (H) 06/28/2018   AST 33 06/28/2018   NA 137 06/29/2018   K 4.0 06/29/2018   CL 107 06/29/2018   CREATININE 0.78 06/29/2018   BUN 7 06/29/2018   CO2 25 06/29/2018   TSH 1.21 04/01/2017   HGBA1C 6.2 (A) 02/10/2018    No results found.  Assessment & Plan:   Destinie was seen today for hypertension.  Diagnoses and all orders for this visit:  Need for influenza vaccination -     Flu Vaccine QUAD 36+ mos IM  Essential hypertension, benign- She is symptomatic with dehydration.  Her blood pressure is adequately well controlled.  For the time being, I have asked her to stop taking the thiazide diuretic but to continue nebivolol for blood pressure control.  She will monitor her blood pressure and if it escalates then we will consider restarting the thiazide diuretic.   I have discontinued Eliseo Gum. Pavel's hydrochlorothiazide,  ondansetron, pantoprazole, sucralfate, and traMADol. I am also having her maintain her albuterol, acetaminophen, potassium chloride, escitalopram, and nebivolol.  No orders of the defined types were placed in this encounter.    Follow-up: Return in about 2 months (around 09/05/2018).  Scarlette Calico, MD

## 2018-07-06 ENCOUNTER — Encounter: Payer: Federal, State, Local not specified - PPO | Attending: General Surgery | Admitting: Skilled Nursing Facility1

## 2018-07-06 DIAGNOSIS — E669 Obesity, unspecified: Secondary | ICD-10-CM | POA: Diagnosis not present

## 2018-07-07 ENCOUNTER — Telehealth (HOSPITAL_COMMUNITY): Payer: Self-pay

## 2018-07-07 NOTE — Telephone Encounter (Signed)
Talked with patient to assess hydration status.  She saw PCP who stopped her Hctz due to lightheadness.  She continues to feel dizzy.  She reports she tried some eggs this morning which made her sick.  We discussed only trying new foods in evening to allow for fluid in the early part of the day.  I have asked her to drink fluids today only.  Dr Redmond Pulling made aware via Epic message patient would benefit from additional outpt fluids.

## 2018-07-08 ENCOUNTER — Encounter: Payer: Self-pay | Admitting: Skilled Nursing Facility1

## 2018-07-08 ENCOUNTER — Other Ambulatory Visit: Payer: Self-pay | Admitting: General Surgery

## 2018-07-08 NOTE — Progress Notes (Signed)
Bariatric Class:  Appt start time: 1530 end time:  1630.  2 Week Post-Operative Nutrition Class  Patient was seen on 07/08/2018 for Post-Operative Nutrition education at [facility].   Pt states she is struggling to meet her fluid needs due to not remembering to drink throughout the day. Dietitian called pt today (07/08/2018) to check on pts hydration status: LVM  Surgery date: 06/22/2018 Surgery type: RYGB Start weight at [facility]: 254.8 Weight today: 236.6  TANITA  BODY COMP RESULTS  07/08/2018   BMI (kg/m^2) 34.9   Fat Mass (lbs) 119.2   Fat Free Mass (lbs) 117.4   Total Body Water (lbs) 85.6   The following the learning objectives were met by the patient during this course:  Identifies Phase 3A (Soft, High Proteins) Dietary Goals and will begin from 2 weeks post-operatively to 2 months post-operatively  Identifies appropriate sources of fluids and proteins   States protein recommendations and appropriate sources post-operatively  Identifies the need for appropriate texture modifications, mastication, and bite sizes when consuming solids  Identifies appropriate multivitamin and calcium sources post-operatively  Describes the need for physical activity post-operatively and will follow MD recommendations  States when to call healthcare provider regarding medication questions or post-operative complications  Handouts given during class include:  Phase 3A: Soft, High Protein Diet Handout  Follow-Up Plan: Patient will follow-up at [facility] in 6 weeks for 2 month post-op nutrition visit for diet advancement per MD.

## 2018-07-09 ENCOUNTER — Ambulatory Visit (HOSPITAL_COMMUNITY)
Admission: RE | Admit: 2018-07-09 | Discharge: 2018-07-09 | Disposition: A | Discharge status: Home / Self Care | Payer: Federal, State, Local not specified - PPO | Source: Ambulatory Visit | Attending: Internal Medicine | Admitting: Internal Medicine

## 2018-07-09 DIAGNOSIS — Z87442 Personal history of urinary calculi: Secondary | ICD-10-CM | POA: Diagnosis not present

## 2018-07-09 DIAGNOSIS — R8271 Bacteriuria: Secondary | ICD-10-CM | POA: Diagnosis not present

## 2018-07-09 DIAGNOSIS — R110 Nausea: Secondary | ICD-10-CM | POA: Diagnosis not present

## 2018-07-09 DIAGNOSIS — M1990 Unspecified osteoarthritis, unspecified site: Secondary | ICD-10-CM | POA: Diagnosis not present

## 2018-07-09 DIAGNOSIS — N3000 Acute cystitis without hematuria: Secondary | ICD-10-CM | POA: Diagnosis not present

## 2018-07-09 MED ORDER — ONDANSETRON HCL 4 MG/2ML IJ SOLN: 4.0000 mg | INTRAMUSCULAR | Status: DC | PRN

## 2018-07-09 MED ORDER — POTASSIUM CHLORIDE IN NACL 20-0.9 MEQ/L-% IV SOLN
INTRAVENOUS | Status: DC
  Administered 2018-07-09: 14:00:00 via INTRAVENOUS
  Filled 2018-07-09 (×2): qty 1000

## 2018-07-09 MED ORDER — THIAMINE HCL 100 MG/ML IJ SOLN
INTRAVENOUS | Status: DC
  Administered 2018-07-09: 11:00:00 via INTRAVENOUS
  Filled 2018-07-09 (×4): qty 1000

## 2018-07-09 MED ORDER — ONDANSETRON 4 MG PO TBDP: 4.0000 mg | ORAL_TABLET | ORAL | Status: DC | PRN

## 2018-07-09 NOTE — Progress Notes (Signed)
PATIENT CARE CENTER NOTE  Diagnosis: Dehydration    Provider: Dr. Greer Pickerel   Procedure: Banana Bag and 1 liter fluid bolus   Note: Patient received a 1 liter Banana Bag and a 1 liter bolus of 0.9% NS w/ 20 meq of KCL. Tolerated infusions well with no adverse reaction. Vital signs stable. Discharge instructions given. Patient alert, oriented and ambulatory at discharge.

## 2018-07-09 NOTE — Discharge Instructions (Signed)
Dehydration, Adult Dehydration is when there is not enough fluid or water in your body. This happens when you lose more fluids than you take in. Dehydration can range from mild to very bad. It should be treated right away to keep it from getting very bad. Symptoms of mild dehydration may include:  Thirst.  Dry lips.  Slightly dry mouth.  Dry, warm skin.  Dizziness. Symptoms of moderate dehydration may include:  Very dry mouth.  Muscle cramps.  Dark pee (urine). Pee may be the color of tea.  Your body making less pee.  Your eyes making fewer tears.  Heartbeat that is uneven or faster than normal (palpitations).  Headache.  Light-headedness, especially when you stand up from sitting.  Fainting (syncope). Symptoms of very bad dehydration may include:  Changes in skin, such as: ? Cold and clammy skin. ? Blotchy (mottled) or pale skin. ? Skin that does not quickly return to normal after being lightly pinched and let go (poor skin turgor).  Changes in body fluids, such as: ? Feeling very thirsty. ? Your eyes making fewer tears. ? Not sweating when body temperature is high, such as in hot weather. ? Your body making very little pee.  Changes in vital signs, such as: ? Weak pulse. ? Pulse that is more than 100 beats a minute when you are sitting still. ? Fast breathing. ? Low blood pressure.  Other changes, such as: ? Sunken eyes. ? Cold hands and feet. ? Confusion. ? Lack of energy (lethargy). ? Trouble waking up from sleep. ? Short-term weight loss. ? Unconsciousness. Follow these instructions at home:  If told by your doctor, drink an ORS: ? Make an ORS by using instructions on the package. ? Start by drinking small amounts, about  cup (120 mL) every 5-10 minutes. ? Slowly drink more until you have had the amount that your doctor said to have.  Drink enough clear fluid to keep your pee clear or pale yellow. If you were told to drink an ORS, finish the ORS  first, then start slowly drinking clear fluids. Drink fluids such as: ? Water. Do not drink only water by itself. Doing that can make the salt (sodium) level in your body get too low (hyponatremia). ? Ice chips. ? Fruit juice that you have added water to (diluted). ? Low-calorie sports drinks.  Avoid: ? Alcohol. ? Drinks that have a lot of sugar. These include high-calorie sports drinks, fruit juice that does not have water added, and soda. ? Caffeine. ? Foods that are greasy or have a lot of fat or sugar.  Take over-the-counter and prescription medicines only as told by your doctor.  Do not take salt tablets. Doing that can make the salt level in your body get too high (hypernatremia).  Eat foods that have minerals (electrolytes). Examples include bananas, oranges, potatoes, tomatoes, and spinach.  Keep all follow-up visits as told by your doctor. This is important. Contact a doctor if:  You have belly (abdominal) pain that: ? Gets worse. ? Stays in one area (localizes).  You have a rash.  You have a stiff neck.  You get angry or annoyed more easily than normal (irritability).  You are more sleepy than normal.  You have a harder time waking up than normal.  You feel: ? Weak. ? Dizzy. ? Very thirsty.  You have peed (urinated) only a small amount of very dark pee during 6-8 hours. Get help right away if:  You have symptoms of   very bad dehydration.  You cannot drink fluids without throwing up (vomiting).  Your symptoms get worse with treatment.  You have a fever.  You have a very bad headache.  You are throwing up or having watery poop (diarrhea) and it: ? Gets worse. ? Does not go away.  You have blood or something green (bile) in your throw-up.  You have blood in your poop (stool). This may cause poop to look black and tarry.  You have not peed in 6-8 hours.  You pass out (faint).  Your heart rate when you are sitting still is more than 100 beats a  minute.  You have trouble breathing. This information is not intended to replace advice given to you by your health care provider. Make sure you discuss any questions you have with your health care provider. Document Released: 05/17/2009 Document Revised: 02/08/2016 Document Reviewed: 09/14/2015 Elsevier Interactive Patient Education  2018 [employer]  

## 2018-07-12 ENCOUNTER — Encounter (HOSPITAL_BASED_OUTPATIENT_CLINIC_OR_DEPARTMENT_OTHER): Payer: Self-pay | Admitting: *Deleted

## 2018-07-12 ENCOUNTER — Telehealth: Payer: Self-pay | Admitting: Skilled Nursing Facility1

## 2018-07-12 ENCOUNTER — Other Ambulatory Visit: Payer: Self-pay

## 2018-07-12 NOTE — Telephone Encounter (Signed)
RD called pt to verify fluid intake once starting soft, solid proteins 2 week post-bariatric surgery.   Daily Fluid intake: 67.6 oz sometimes with flavoring; propel zero, decaff coffee, almond milk Daily Protein intake: egg whites, [place] salad with a  Boiled egg, edemame, grilled chicken: 60+  Concerns/issues:  Dealing with a lot of painful kidney stones stating she is getting surgery this Friday. Pt states she has noticed cold fluids do much better. Pt states he can eat seafood which is great because it is her favorite food.  [place] went well but then it did not.

## 2018-07-12 NOTE — Progress Notes (Signed)
Spoke with Shekinah Npo after mdinight, arrive 1000 am 07-16-18 [facility] meds to take sip of water: lexapro, bystolic, pantaprazole, albuerol inhaler prn/bring inhaler Spouse jeffrey Aigner driver records on chart/epic: ekg 06-27-18, chest xray 06-27-18 Needs I stat 4 Has surgery orders in epic

## 2018-07-16 ENCOUNTER — Encounter (HOSPITAL_BASED_OUTPATIENT_CLINIC_OR_DEPARTMENT_OTHER)
Admission: RE | Disposition: A | Discharge status: Home / Self Care | Payer: Self-pay | Source: Home / Self Care | Attending: Urology

## 2018-07-16 ENCOUNTER — Ambulatory Visit (HOSPITAL_BASED_OUTPATIENT_CLINIC_OR_DEPARTMENT_OTHER): Payer: Federal, State, Local not specified - PPO | Admitting: Anesthesiology

## 2018-07-16 ENCOUNTER — Ambulatory Visit (HOSPITAL_BASED_OUTPATIENT_CLINIC_OR_DEPARTMENT_OTHER)
Admission: RE | Admit: 2018-07-16 | Discharge: 2018-07-16 | Disposition: A | Discharge status: Home / Self Care | Payer: Federal, State, Local not specified - PPO | Attending: Urology | Admitting: Urology

## 2018-07-16 ENCOUNTER — Encounter (HOSPITAL_BASED_OUTPATIENT_CLINIC_OR_DEPARTMENT_OTHER): Payer: Self-pay | Admitting: Anesthesiology

## 2018-07-16 DIAGNOSIS — I10 Essential (primary) hypertension: Secondary | ICD-10-CM | POA: Diagnosis not present

## 2018-07-16 DIAGNOSIS — Z87891 Personal history of nicotine dependence: Secondary | ICD-10-CM | POA: Insufficient documentation

## 2018-07-16 DIAGNOSIS — D509 Iron deficiency anemia, unspecified: Secondary | ICD-10-CM | POA: Diagnosis not present

## 2018-07-16 DIAGNOSIS — N202 Calculus of kidney with calculus of ureter: Secondary | ICD-10-CM | POA: Diagnosis not present

## 2018-07-16 DIAGNOSIS — N201 Calculus of ureter: Secondary | ICD-10-CM | POA: Diagnosis not present

## 2018-07-16 DIAGNOSIS — Z9884 Bariatric surgery status: Secondary | ICD-10-CM | POA: Diagnosis not present

## 2018-07-16 HISTORY — PX: CYSTOSCOPY/URETEROSCOPY/HOLMIUM LASER/STENT PLACEMENT: SHX6546

## 2018-07-16 HISTORY — DX: Family history of other specified conditions: Z84.89

## 2018-07-16 HISTORY — DX: Unspecified urinary incontinence: R32

## 2018-07-16 LAB — POCT I-STAT 4, (NA,K, GLUC, HGB,HCT)
Glucose, Bld: 97 mg/dL (ref 70–99)
HCT: 39 % (ref 36.0–46.0)
Hemoglobin: 13.3 g/dL (ref 12.0–15.0)
Potassium: 3.7 mmol/L (ref 3.5–5.1)
Sodium: 144 mmol/L (ref 135–145)

## 2018-07-16 SURGERY — CYSTOSCOPY/URETEROSCOPY/HOLMIUM LASER/STENT PLACEMENT
Anesthesia: General | Site: Ureter | Laterality: Left

## 2018-07-16 MED ORDER — LIDOCAINE HCL 1 % IJ SOLN
INTRAMUSCULAR | Status: DC | PRN
  Administered 2018-07-16: 30 mg via INTRADERMAL

## 2018-07-16 MED ORDER — PHENAZOPYRIDINE HCL 200 MG PO TABS
200.0000 mg | ORAL_TABLET | Freq: Once | ORAL | Status: AC
  Administered 2018-07-16: 200 mg via ORAL
  Filled 2018-07-16: qty 1

## 2018-07-16 MED ORDER — LACTATED RINGERS IV SOLN
INTRAVENOUS | Status: DC
  Administered 2018-07-16: 11:00:00 via INTRAVENOUS
  Filled 2018-07-16: qty 1000

## 2018-07-16 MED ORDER — PHENAZOPYRIDINE HCL 100 MG PO TABS
ORAL_TABLET | ORAL | Status: AC
  Filled 2018-07-16: qty 2

## 2018-07-16 MED ORDER — DEXAMETHASONE SODIUM PHOSPHATE 10 MG/ML IJ SOLN
INTRAMUSCULAR | Status: AC
  Filled 2018-07-16: qty 1

## 2018-07-16 MED ORDER — HYDRALAZINE HCL 20 MG/ML IJ SOLN
INTRAMUSCULAR | Status: AC
  Filled 2018-07-16: qty 1

## 2018-07-16 MED ORDER — MIDAZOLAM HCL 5 MG/5ML IJ SOLN
INTRAMUSCULAR | Status: DC | PRN
  Administered 2018-07-16: 2 mg via INTRAVENOUS

## 2018-07-16 MED ORDER — FENTANYL CITRATE (PF) 100 MCG/2ML IJ SOLN
INTRAMUSCULAR | Status: DC | PRN
  Administered 2018-07-16: 75 ug via INTRAVENOUS
  Administered 2018-07-16 (×2): 25 ug via INTRAVENOUS
  Administered 2018-07-16: 50 ug via INTRAVENOUS

## 2018-07-16 MED ORDER — KETOROLAC TROMETHAMINE 30 MG/ML IJ SOLN
INTRAMUSCULAR | Status: DC | PRN
  Administered 2018-07-16: 30 mg via INTRAVENOUS

## 2018-07-16 MED ORDER — OXYBUTYNIN CHLORIDE 5 MG PO TABS
ORAL_TABLET | ORAL | Status: AC
  Filled 2018-07-16: qty 1

## 2018-07-16 MED ORDER — ONDANSETRON HCL 4 MG/2ML IJ SOLN
INTRAMUSCULAR | Status: AC
  Filled 2018-07-16: qty 2

## 2018-07-16 MED ORDER — CEPHALEXIN 500 MG PO CAPS: 500.0000 mg | ORAL_CAPSULE | Freq: Two times a day (BID) | ORAL | 0 refills | Status: AC

## 2018-07-16 MED ORDER — OXYBUTYNIN CHLORIDE 5 MG PO TABS
5.0000 mg | ORAL_TABLET | Freq: Once | ORAL | Status: AC
  Administered 2018-07-16: 5 mg via ORAL
  Filled 2018-07-16: qty 1

## 2018-07-16 MED ORDER — ONDANSETRON HCL 4 MG/2ML IJ SOLN
INTRAMUSCULAR | Status: DC | PRN
  Administered 2018-07-16: 4 mg via INTRAVENOUS

## 2018-07-16 MED ORDER — MIDAZOLAM HCL 2 MG/2ML IJ SOLN
INTRAMUSCULAR | Status: AC
  Filled 2018-07-16: qty 2

## 2018-07-16 MED ORDER — FENTANYL CITRATE (PF) 100 MCG/2ML IJ SOLN
INTRAMUSCULAR | Status: AC
  Filled 2018-07-16: qty 2

## 2018-07-16 MED ORDER — PHENAZOPYRIDINE HCL 200 MG PO TABS: 200.0000 mg | ORAL_TABLET | Freq: Three times a day (TID) | ORAL | 0 refills | Status: DC | PRN

## 2018-07-16 MED ORDER — KETOROLAC TROMETHAMINE 30 MG/ML IJ SOLN
INTRAMUSCULAR | Status: AC
  Filled 2018-07-16: qty 1

## 2018-07-16 MED ORDER — CEFAZOLIN SODIUM-DEXTROSE 2-4 GM/100ML-% IV SOLN
INTRAVENOUS | Status: AC
  Filled 2018-07-16: qty 100

## 2018-07-16 MED ORDER — PROPOFOL 10 MG/ML IV BOLUS
INTRAVENOUS | Status: DC | PRN
  Administered 2018-07-16: 200 mg via INTRAVENOUS

## 2018-07-16 MED ORDER — PROPOFOL 10 MG/ML IV BOLUS
INTRAVENOUS | Status: AC
  Filled 2018-07-16: qty 40

## 2018-07-16 MED ORDER — DEXAMETHASONE SODIUM PHOSPHATE 10 MG/ML IJ SOLN
INTRAMUSCULAR | Status: DC | PRN
  Administered 2018-07-16: 4 mg via INTRAVENOUS

## 2018-07-16 MED ORDER — CEFAZOLIN SODIUM-DEXTROSE 2-4 GM/100ML-% IV SOLN
2.0000 g | Freq: Once | INTRAVENOUS | Status: AC
  Administered 2018-07-16: 2 g via INTRAVENOUS
  Filled 2018-07-16: qty 100

## 2018-07-16 MED ORDER — OXYBUTYNIN CHLORIDE 5 MG PO TABS: 5.0000 mg | ORAL_TABLET | Freq: Three times a day (TID) | ORAL | 1 refills | Status: DC | PRN

## 2018-07-16 MED ORDER — KETOROLAC TROMETHAMINE 30 MG/ML IJ SOLN
30.0000 mg | Freq: Once | INTRAMUSCULAR | Status: DC | PRN
  Filled 2018-07-16: qty 1

## 2018-07-16 MED ORDER — HYDROCODONE-ACETAMINOPHEN 5-325 MG PO TABS: 1.0000 | ORAL_TABLET | ORAL | 0 refills | Status: DC | PRN

## 2018-07-16 MED ORDER — PROMETHAZINE HCL 25 MG/ML IJ SOLN
6.2500 mg | INTRAMUSCULAR | Status: DC | PRN
  Filled 2018-07-16: qty 1

## 2018-07-16 MED ORDER — HYDRALAZINE HCL 20 MG/ML IJ SOLN
INTRAMUSCULAR | Status: DC | PRN
  Administered 2018-07-16: 5 mg via INTRAVENOUS
  Administered 2018-07-16 (×2): 2.5 mg via INTRAVENOUS

## 2018-07-16 MED ORDER — FENTANYL CITRATE (PF) 100 MCG/2ML IJ SOLN
25.0000 ug | INTRAMUSCULAR | Status: DC | PRN
  Filled 2018-07-16: qty 1

## 2018-07-16 SURGICAL SUPPLY — 29 items
BAG DRAIN URO-CYSTO SKYTR STRL (DRAIN) ×2 IMPLANT
BASKET STONE 1.7 NGAGE (UROLOGICAL SUPPLIES) ×2 IMPLANT
BASKET ZERO TIP NITINOL 2.4FR (BASKET) IMPLANT
BENZOIN TINCTURE PRP APPL 2/3 (GAUZE/BANDAGES/DRESSINGS) IMPLANT
CATH URET 5FR 28IN OPEN ENDED (CATHETERS) IMPLANT
CLOTH BEACON ORANGE TIMEOUT ST (SAFETY) ×2 IMPLANT
FIBER LASER FLEXIVA 365 (UROLOGICAL SUPPLIES) IMPLANT
FIBER LASER TRAC TIP (UROLOGICAL SUPPLIES) ×2 IMPLANT
GLOVE BIO SURGEON STRL SZ 6 (GLOVE) ×2 IMPLANT
GLOVE BIO SURGEON STRL SZ7.5 (GLOVE) ×2 IMPLANT
GLOVE BIOGEL PI IND STRL 6.5 (GLOVE) ×1 IMPLANT
GLOVE BIOGEL PI INDICATOR 6.5 (GLOVE) ×1
GOWN STRL REUS W/ TWL LRG LVL3 (GOWN DISPOSABLE) ×1 IMPLANT
GOWN STRL REUS W/TWL LRG LVL3 (GOWN DISPOSABLE) ×1
GOWN STRL REUS W/TWL XL LVL3 (GOWN DISPOSABLE) ×2 IMPLANT
GUIDEWIRE STR DUAL SENSOR (WIRE) IMPLANT
GUIDEWIRE ZIPWRE .038 STRAIGHT (WIRE) ×2 IMPLANT
IV NS 1000ML (IV SOLUTION)
IV NS 1000ML BAXH (IV SOLUTION) IMPLANT
IV NS IRRIG 3000ML ARTHROMATIC (IV SOLUTION) ×2 IMPLANT
KIT TURNOVER CYSTO (KITS) ×2 IMPLANT
MANIFOLD NEPTUNE II (INSTRUMENTS) ×2 IMPLANT
NS IRRIG 500ML POUR BTL (IV SOLUTION) ×2 IMPLANT
PACK CYSTO (CUSTOM PROCEDURE TRAY) ×2 IMPLANT
STENT URET 6FRX24 CONTOUR (STENTS) ×2 IMPLANT
STRIP CLOSURE SKIN 1/2X4 (GAUZE/BANDAGES/DRESSINGS) IMPLANT
SYR 10ML LL (SYRINGE) ×2 IMPLANT
TUBE CONNECTING 12X1/4 (SUCTIONS) ×2 IMPLANT
TUBING UROLOGY SET (TUBING) ×2 IMPLANT

## 2018-07-16 NOTE — Op Note (Signed)
Operative Note  Preoperative diagnosis:  1.  Multiple left ureteral and renal stones  Postoperative diagnosis: 1.  Same  Procedure(s): 1.  Cystoscopy with left ureteroscopy, holmium laser lithotripsy and left JJ stent placement 2.  Left JJ stent removal  Surgeon: Ellison Hughs, MD  Assistants:  None  Anesthesia:  General  Complications:  None  EBL: Less than 5 mL  Specimens: 1.  Left ureteral and renal stones  Drains/Catheters: 1.  Left 6 French by 24 cm JJ stent with tether  Intraoperative findings:   1. She had a 2 mm left UPJ calculus as well as multiple 3 to 4 mm left renal stones  Indication:  Mary Sanford is a 52 y.o. female with a history of kidney stones.  She was seen in the emergency department on the 06/27/2018 for acute left-sided flank pain.  She had a CT stone study that revealed multiple obstructing UPJ as well as nonobstructing renal calculi with left-sided hydronephrosis.  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  She is here today for ureteroscopy to treat her left-sided kidney stones.  She denies interval fever/chills, nausea/vomiting or hematuria.  She does report worsening urgency to void since having the stent placed, but states that it is manageable.  Description of procedure:  After informed consent was obtained, the patient was brought to the operating room and general LMA anesthesia was administered. The patient was then placed in the dorsolithotomy position and prepped and draped in usual sterile fashion. A timeout was performed. A 23 French rigid cystoscope was then inserted into the urethral meatus and advanced into the bladder under direct vision. A complete bladder survey revealed no intravesical pathology.  Her previously placed left JJ stent was then grasped and retracted to the urethral meatus.  A Glidewire was then advanced through the lumen of the stent up to the left  renal pelvis, under fluoroscopic guidance.  The previously placed stent was removed over the wire intact, inspected and discarded.  A flexible ureteroscope was then advanced alongside the left ureteral wire and up to her left UPJ where she had an obstructing stone that was grasped with an engage basket and removed atraumatically.  Inspection of her left renal pelvis revealed multiple 3 to 4 mm nonobstructing renal calculi that were fractured with the holmium laser.  I then removed all stone fragments with the engage basket.  The flexible ureteroscope was removed and a new 6 [place] by 24 cm JJ stent was placed over the wire and into good position within the left collecting system, confirming placement via fluoroscopy.  The patient's bladder was drained. She tolerated the procedure well and was transferred to the postanesthesia in stable condition.  Plan: The patient has been instructed to remove her left JJ stent, which is on a tether, at 6 AM on 07/19/2018.  She will follow-up in the office in 6 weeks for a left renal ultrasound.

## 2018-07-16 NOTE — Anesthesia Procedure Notes (Signed)
Procedure Name: LMA Insertion Date/Time: 07/16/2018 12:10 PM Performed by: Garrel Ridgel, CRNA Pre-anesthesia Checklist: Patient identified, Emergency Drugs available, Suction available, Patient being monitored and Timeout performed Patient Re-evaluated:Patient Re-evaluated prior to induction Oxygen Delivery Method: Circle system utilized Preoxygenation: Pre-oxygenation with 100% oxygen Induction Type: IV induction Ventilation: Mask ventilation without difficulty LMA: LMA inserted LMA Size: 4.0 Tube secured with: Tape Dental Injury: Teeth and Oropharynx as per pre-operative assessment

## 2018-07-16 NOTE — Anesthesia Postprocedure Evaluation (Signed)
Anesthesia Post Note  Patient: Mary Sanford  Procedure(s) Performed: CYSTOSCOPY/URETEROSCOPY/HOLMIUM LASER/STENT EXCHANGE (Left Ureter)     Patient location during evaluation: PACU Anesthesia Type: General Level of consciousness: awake and alert Pain management: pain level controlled Vital Signs Assessment: post-procedure vital signs reviewed and stable Respiratory status: spontaneous breathing, nonlabored ventilation, respiratory function stable and patient connected to nasal cannula oxygen Cardiovascular status: blood pressure returned to baseline and stable Postop Assessment: no apparent nausea or vomiting Anesthetic complications: no    Last Vitals:  Vitals:   07/16/18 1300 07/16/18 1315  BP: (!) 165/88 (!) 167/88  Pulse: 84 86  Resp: 16 13  Temp:    SpO2: 100% 100%    Last Pain:  Vitals:   07/16/18 1315  TempSrc:   PainSc: Asleep                 [name]

## 2018-07-16 NOTE — Transfer of Care (Signed)
Immediate Anesthesia Transfer of Care Note  Patient: Mary Sanford  Procedure(s) Performed: CYSTOSCOPY/URETEROSCOPY/HOLMIUM LASER/STENT EXCHANGE (Left Ureter)  Patient Location: PACU  Anesthesia Type:General  Level of Consciousness: oriented, drowsy and patient cooperative  Airway & Oxygen Therapy: Patient Spontanous Breathing and Patient connected to face mask oxygen  Post-op Assessment: Report given to RN and Post -op Vital signs reviewed and stable  Post vital signs: Reviewed and stable  Last Vitals:  Vitals Value Taken Time  BP 172/92 07/16/2018 12:55 PM  Temp    Pulse 86 07/16/2018 12:58 PM  Resp 15 07/16/2018 12:58 PM  SpO2 100 % 07/16/2018 12:58 PM  Vitals shown include unvalidated device data.  Last Pain:  Vitals:   07/16/18 1100  TempSrc:   PainSc: 5       Patients Stated Pain Goal: 3 (04/47/15 8063)  Complications: No apparent anesthesia complications

## 2018-07-16 NOTE — H&P (Signed)
Urology Preoperative H&P   Chief Complaint: Left ureteral stone   History of Present Illness: Mary Sanford is a 52 y.o. female with a history of kidney stones.  She was seen in the emergency department on the 06/27/2018 for acute left-sided flank pain.  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  She is here today for ureteroscopy to treat her left-sided kidney stones.  She denies interval fever/chills, nausea/vomiting or hematuria.  She does report worsening urgency to void since having the stent placed, but states that it is manageable.  Past Medical History:  Diagnosis Date  . Allergy   . Anemia   . Anxiety   . Arthritis    arm/ wrist areas  . B12 deficiency    resolved  . Constipation    not chronic- now stools soft and regular   . Depression    no meds  . Family history of adverse reaction to anesthesia    one sister ponv and slow to awaken  . History of kidney stones   . Hypertension   . Urinary incontinence wears depends    Past Surgical History:  Procedure Laterality Date  . BUNIONECTOMY Bilateral   . caesarean section     x 1  . cystoscopic     extraction of ureteric calculus without disintegration  . CYSTOSCOPY W/ RETROGRADES Left 06/28/2018   Procedure: CYSTOSCOPY WITH RETROGRADE PYELOGRAM LEFT STENT;  Surgeon: Ceasar Mons, MD;  Location: [facility];  Service: Urology;  Laterality: Left;  . DILITATION & CURRETTAGE/HYSTROSCOPY WITH NOVASURE ABLATION N/A 06/17/2013   Procedure: DILATATION & CURETTAGE/HYSTEROSCOPY WITH NOVASURE ABLATION;  Surgeon: Osborne Oman, MD;  Location: [facility];  Service: Gynecology;  Laterality: N/A;  . 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   Procedure: LAPAROSCOPIC ROUX-EN-Y GASTRIC BYPASS WITH UPPER ENDOSCOPY;  Surgeon: Greer Pickerel, MD;  Location: [facility];  Service:  General;  Laterality: N/A;  . HEMORRHOIDECTOMY WITH HEMORRHOID BANDING  10/19/2012   [facility] [place] Hem ligation/pexy  . TUBAL LIGATION    . WISDOM TOOTH EXTRACTION      Allergies:  Allergies  Allergen Reactions  . Naproxen Other (See Comments)    Panic attacks No Problem with Motrin    Family History  Problem Relation Age of Onset  . Hypertension Mother   . Hypothyroidism Mother   . Asthma Mother   . Cancer Mother 6       Colon and rectal  . Rectal cancer Mother 63  . Colon cancer Mother 23  . Hypotension Sister   . Hypothyroidism Sister   . Diabetes Sister   . Seizures Brother   . Hypothyroidism Sister   . Seizures Sister   . Anemia Sister   . Asthma Other   . Hypothyroidism Other   . Heart disease Paternal Grandfather        >55  . Stroke Maternal Uncle        > 55  . Deep vein thrombosis Sister   . Early death Neg Hx   . Hyperlipidemia Neg Hx   . Kidney disease Neg Hx   . Colon polyps Neg Hx   . Esophageal cancer Neg Hx   . Stomach cancer Neg Hx     Social History:  reports that she quit smoking about 18 years ago. Her smoking use included cigarettes. She quit after 5.00 years  of use. She has never used smokeless tobacco. She reports previous alcohol use. She reports that she does not use drugs.  ROS: A complete review of systems was performed.  All systems are negative except for pertinent findings as noted.  Physical Exam:  Vital signs in last 24 hours: Temp:  [97.9 F (36.6 C)] 97.9 F (36.6 C) (12/13 1022) Pulse Rate:  [72] 72 (12/13 1022) Resp:  [16] 16 (12/13 1022) BP: (144)/(94) 144/94 (12/13 1022) SpO2:  [100 %] 100 % (12/13 1022) Weight:  [107.1 kg] 107.1 kg (12/13 1022) Constitutional:  Alert and oriented, No acute distress Cardiovascular: Regular rate and rhythm, No JVD Respiratory: Normal respiratory effort, Lungs clear bilaterally GI: Abdomen is soft, nontender, nondistended, no abdominal masses GU: No CVA tenderness Lymphatic: No  lymphadenopathy Neurologic: Grossly intact, no focal deficits Psychiatric: Normal mood and affect  Laboratory Data:  Recent Labs    07/16/18 1125  HGB 13.3  HCT 39.0    Recent Labs    07/16/18 1125  NA 144  K 3.7  GLUCOSE 97     Results for orders placed or performed during the hospital encounter of 07/16/18 (from the past 24 hour(s))  I-STAT 4, (NA,K, GLUC, HGB,HCT)     Status: None   Collection Time: 07/16/18 11:25 AM  Result Value Ref Range   Sodium 144 135 - 145 mmol/L   Potassium 3.7 3.5 - 5.1 mmol/L   Glucose, Bld 97 70 - 99 mg/dL   HCT 39.0 36.0 - 46.0 %   Hemoglobin 13.3 12.0 - 15.0 g/dL   No results found for this or any previous visit (from the past 240 hour(s)).  Renal Function: No results for input(s): CREATININE in the last 168 hours. Estimated Creatinine Clearance: 108.5 mL/min (by C-G formula based on SCr of 0.78 mg/dL).  Radiologic Imaging: No results found.  I independently reviewed the above imaging studies.  Assessment and Plan Mary Sanford is a 52 y.o. female with multiple obstructing 2 to 3 mm left UPJ calculi  The risks, benefits and alternatives of cystoscopy with LEFT ureteroscopy, laser lithotripsy and ureteral stent placement was discussed the patient.  Risks included, but are not limited to: bleeding, urinary tract infection, ureteral injury/avulsion, ureteral stricture formation, retained stone fragments, the possibility that multiple surgeries may be required to treat the stone(s), MI, stroke, PE and the inherent risks of general anesthesia.  The patient voices understanding and wishes to proceed.      Ellison Hughs, MD 07/16/2018, 11:52 AM  [facility] Pager: [phone]

## 2018-07-16 NOTE — Anesthesia Preprocedure Evaluation (Signed)
Anesthesia Evaluation  Patient identified by MRN, date of birth, ID band Patient awake    Reviewed: Allergy & Precautions, NPO status , Patient's Chart, lab work & pertinent test results  Airway Mallampati: II  TM Distance: >3 FB Neck ROM: Full    Dental no notable dental hx.    Pulmonary neg pulmonary ROS, former smoker,    Pulmonary exam normal breath sounds clear to auscultation       Cardiovascular hypertension, Normal cardiovascular exam Rhythm:Regular Rate:Normal     Neuro/Psych negative neurological ROS  negative psych ROS   GI/Hepatic negative GI ROS, Neg liver ROS,   Endo/Other  negative endocrine ROS  Renal/GU negative Renal ROS  negative genitourinary   Musculoskeletal negative musculoskeletal ROS (+)   Abdominal   Peds negative pediatric ROS (+)  Hematology negative hematology ROS (+)   Anesthesia Other Findings   Reproductive/Obstetrics negative OB ROS                             Anesthesia Physical Anesthesia Plan  ASA: II  Anesthesia Plan: General   Post-op Pain Management:    Induction: Intravenous  PONV Risk Score and Plan: 2 and Ondansetron, Dexamethasone and Treatment may vary due to age or medical condition  Airway Management Planned: LMA  Additional Equipment:   Intra-op Plan:   Post-operative Plan: Extubation in OR  Informed Consent: I have reviewed the patients History and Physical, chart, labs and discussed the procedure including the risks, benefits and alternatives for the proposed anesthesia with the patient or authorized representative who has indicated his/her understanding and acceptance.   Dental advisory given  Plan Discussed with: CRNA and Surgeon  Anesthesia Plan Comments:         Anesthesia Quick Evaluation

## 2018-07-19 ENCOUNTER — Encounter (HOSPITAL_BASED_OUTPATIENT_CLINIC_OR_DEPARTMENT_OTHER): Payer: Self-pay | Admitting: Urology

## 2018-07-20 DIAGNOSIS — N202 Calculus of kidney with calculus of ureter: Secondary | ICD-10-CM | POA: Diagnosis not present

## 2018-08-11 ENCOUNTER — Encounter: Payer: Self-pay | Admitting: Skilled Nursing Facility1

## 2018-08-11 ENCOUNTER — Encounter: Payer: Federal, State, Local not specified - PPO | Attending: General Surgery | Admitting: Skilled Nursing Facility1

## 2018-08-11 DIAGNOSIS — E669 Obesity, unspecified: Secondary | ICD-10-CM | POA: Diagnosis not present

## 2018-08-11 NOTE — Patient Instructions (Addendum)
-  Keep in mind sugar free jello is not a replacment for yogurt or other protein foods; jello is just extra taste with no nutrients  -Do not go longer than 5 hours without eating   -Continue to aim for a minimum of 64 fluid ounces 7 days a week with at least 30 ounces being plain water  -Eat non-starchy vegetables 2 times a day 7 days a week  -Start out with soft cooked vegetables today and tomorrow; if tolerated begin to eat raw vegetables or cooked including salads  -Eat your 3 ounces of protein first then start in on your non-starchy vegetables; once you understand how much of your meal leads to satisfaction and not full while still eating 3 ounces of protein and non-starchy vegetables you can eat them in any order   -Continue to aim for 30 minutes of activity at least 5 times a week  -Switch to the multivitamin capsule: have food on your stomach before you take it

## 2018-08-11 NOTE — Progress Notes (Signed)
Follow-up visit:  8 Weeks Post-Operative RYGB Surgery  Primary concerns today: Post-operative Bariatric Surgery Nutrition Management.   Pt states her kidney stone procedure went well. Pt reports having eaten peas and yams stating she just was not thinking what was in the phase or not. Pt states she cannot tolerate egg. Pt states she has been craving salads. Pt states she has had Champagne and wine: Dietitian cautioned pt against this behavior. Pt states she has had to go to the rehydration clinic twice.    Surgery date: 06/22/2018 Surgery type: RYGB Start weight at [facility]: 254.8 Weight today: 224.6 Weight change: 12  TANITA  BODY COMP RESULTS  07/08/2018 08/11/2018   BMI (kg/m^2) 34.9 33.2   Fat Mass (lbs) 119.2 111.2   Fat Free Mass (lbs) 117.4 113.4   Total Body Water (lbs) 85.6 82.2   24-hr recall: B (8-9:30AM): yogurt or sugar free jello and string cheese and then coffee or grilled chicken and beans with green peppers and onions Snk (AM):   L (3 PM): chicken or seafood or lentils with chicken  Snk (PM):  Dried edamame D (PM): steamed edamame  Snk (PM): sugar free jello   Fluid intake: water, decaf coffee, hot tea, unsweet tea, water with flavoring: 99+ Estimated total protein intake: 60+  Medications: see list Supplementation: celebrate and citrate calcium   Using straws: no Drinking while eating: no Having you been chewing well:no Chewing/swallowing difficulties: no Changes in vision: no Changes to mood/headaches: no Hair loss/Cahnges to skin/Changes to nails: no Any difficulty focusing or concentrating: no Sweating: no Dizziness/Lightheaded:  Palpitations: no  Carbonated beverages: no N/V/D/C/GAS: no Abdominal Pain: no Dumping syndrome: no  Recent physical activity:  30 minutes 5 days a week; planks and crunches  Progress Towards Goal(s):  In progress.  Handouts given during visit include:  Non starchy veggies    Intervention:  Nutrition counseling. Pts  diet was advanced to the next phase now including non starchy veggies Due to the bodies need for essential vitamins, minerals, and fats the pt was educated on the need to consume a certain amount of calories as well as certain nutrients daily. Pt was educated on the need for daily physical activity and to reach a goal of at least 150 minutes of moderate to vigorous physical activity as directed by their physician due to such benefits as increased musculature and improved lab values. Goals: -Keep in mind sugar free jello is not a replacment for yogurt or other protein foods; jello is just extra taste with no nutrients -Do not go longer than 5 hours without eating  -Continue to aim for a minimum of 64 fluid ounces 7 days a week with at least 30 ounces being plain water -Eat non-starchy vegetables 2 times a day 7 days a week -Start out with soft cooked vegetables today and tomorrow; if tolerated begin to eat raw vegetables or cooked including salads -Eat your 3 ounces of protein first then start in on your non-starchy vegetables; once you understand how much of your meal leads to satisfaction and not full while still eating 3 ounces of protein and non-starchy vegetables you can eat them in any order  -Continue to aim for 30 minutes of activity at least 5 times a week -Switch to the multivitamin capsule: have food on your stomach before you take it      Teaching Method Utilized:  Visual Auditory Hands on  Barriers to learning/adherence to lifestyle change: none identified   Demonstrated degree of  understanding via:  Teach Back   Monitoring/Evaluation:  Dietary intake, exercise, and body weight. Follow up in 3 months

## 2018-08-21 ENCOUNTER — Encounter (HOSPITAL_COMMUNITY): Payer: Self-pay

## 2018-08-21 ENCOUNTER — Emergency Department (HOSPITAL_COMMUNITY)
Admission: EM | Admit: 2018-08-21 | Discharge: 2018-08-22 | Disposition: A | Discharge status: Home / Self Care | Payer: Federal, State, Local not specified - PPO | Attending: Emergency Medicine | Admitting: Emergency Medicine

## 2018-08-21 ENCOUNTER — Other Ambulatory Visit: Payer: Self-pay

## 2018-08-21 DIAGNOSIS — I10 Essential (primary) hypertension: Secondary | ICD-10-CM | POA: Insufficient documentation

## 2018-08-21 DIAGNOSIS — Z87891 Personal history of nicotine dependence: Secondary | ICD-10-CM | POA: Diagnosis not present

## 2018-08-21 MED ORDER — DIPHENHYDRAMINE HCL 50 MG/ML IJ SOLN
25.0000 mg | Freq: Once | INTRAMUSCULAR | Status: AC
  Administered 2018-08-22: 25 mg via INTRAVENOUS
  Filled 2018-08-21: qty 1

## 2018-08-21 MED ORDER — NEBIVOLOL HCL 5 MG PO TABS: 5.0000 mg | ORAL_TABLET | Freq: Every day | ORAL | Status: DC

## 2018-08-21 MED ORDER — DEXAMETHASONE SODIUM PHOSPHATE 10 MG/ML IJ SOLN
10.0000 mg | Freq: Once | INTRAMUSCULAR | Status: AC
  Administered 2018-08-22: 10 mg via INTRAVENOUS
  Filled 2018-08-21: qty 1

## 2018-08-21 MED ORDER — METOCLOPRAMIDE HCL 5 MG/ML IJ SOLN
10.0000 mg | Freq: Once | INTRAMUSCULAR | Status: AC
  Administered 2018-08-22: 10 mg via INTRAVENOUS
  Filled 2018-08-21: qty 2

## 2018-08-21 MED ORDER — SODIUM CHLORIDE 0.9 % IV BOLUS
1000.0000 mL | Freq: Once | INTRAVENOUS | Status: AC
  Administered 2018-08-22: 1000 mL via INTRAVENOUS

## 2018-08-21 NOTE — ED Triage Notes (Signed)
Pt reports that she had awaken this am c/o HA and dizziness and lightheadedness. Pt states that she is out of her Bystolic RX

## 2018-08-22 MED ORDER — NEBIVOLOL HCL 5 MG PO TABS: 5.0000 mg | ORAL_TABLET | Freq: Every day | ORAL | 1 refills | Status: DC

## 2018-08-22 NOTE — ED Provider Notes (Signed)
Emergency Department Provider Note   I have reviewed the triage vital signs and the nursing notes.   HISTORY  Chief Complaint Hypertension   HPI Mary Sanford is a 53 y.o. female who presents emergency department today for headache, lightheadedness and hypertension.  Patient blood pressure medication because she "was too busy concentrate on my vitamins".  She states that she is been on it for couple weeks.  She sometimes does get headaches but they are not quite like this when she gets them.  She feels lightheaded and has dry mouth she think she is dehydrated.  She not had any nausea or vomiting.  She has no diarrhea or constipation no urinary symptoms.  She has no chest pain, shortness of breath, lower extremity swelling, vision changes, neurologic changes or change in urination. No other associated or modifying symptoms.    Past Medical History:  Diagnosis Date  . Allergy   . Anemia   . Anxiety   . Arthritis    arm/ wrist areas  . B12 deficiency    resolved  . Constipation    not chronic- now stools soft and regular   . Depression    no meds  . Family history of adverse reaction to anesthesia    one sister ponv and slow to awaken  . History of kidney stones   . Hypertension   . Urinary incontinence wears depends    Patient Active Problem List   Diagnosis Date Noted  . Nausea 07/09/2018  . Chronic low back pain 06/22/2018  . Severe obesity ([place]) 06/22/2018  . ASCUS pap with negative high risk HPV on 06/30/16 06/30/2016  . Depression with anxiety 12/28/2015  . De Quervain's tenosynovitis, left 06/21/2015  . Prediabetes 11/02/2014  . Anemia, iron deficiency 09/17/2013  . Routine general medical examination at a health care facility 01/12/2013  . Allergic rhinitis 01/12/2013  . Constipation, chronic 10/04/2012  . Visit for screening mammogram 01/12/2012  . B12 deficiency anemia 01/09/2012  . Obesity (BMI 30-39.9) 01/09/2012  . Essential hypertension, benign  11/24/2011    Past Surgical History:  Procedure Laterality Date  . BUNIONECTOMY Bilateral   . caesarean section     x 1  . cystoscopic     extraction of ureteric calculus without disintegration  . CYSTOSCOPY W/ RETROGRADES Left 06/28/2018   Procedure: CYSTOSCOPY WITH RETROGRADE PYELOGRAM LEFT STENT;  Surgeon: Ceasar Mons, MD;  Location: [facility];  Service: Urology;  Laterality: Left;  . CYSTOSCOPY/URETEROSCOPY/HOLMIUM LASER/STENT PLACEMENT Left 07/16/2018   Procedure: CYSTOSCOPY/URETEROSCOPY/HOLMIUM LASER/STENT EXCHANGE;  Surgeon: Ceasar Mons, MD;  Location: [facility];  Service: Urology;  Laterality: Left;  . DILITATION & CURRETTAGE/HYSTROSCOPY WITH NOVASURE ABLATION N/A 06/17/2013   Procedure: DILATATION & CURETTAGE/HYSTEROSCOPY WITH NOVASURE ABLATION;  Surgeon: Osborne Oman, MD;  Location: [facility];  Service: Gynecology;  Laterality: N/A;  . 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 HEMORRHOIDECTOMY WITH HEMORRHOID BANDING  10/19/2012   [facility] [place] Hem ligation/pexy  . TUBAL LIGATION    . WISDOM TOOTH EXTRACTION      Current Outpatient Rx  . Order #: [id number] Class: Historical Med  . Order #: [id number] Class: Normal  . Order #: [id number] Class: Historical Med  . Order #: [id number] Class: Normal  . Order #: [id number] Class: Print  . Order #: [id number] Class: Historical Med  . Order #: [id number] Class: Normal  .  Order #: [id number] Class: Historical Med  . Order #: [id number] Class: Print  . Order #: [id number] Class: Print  . Order #: [id number] Class: Normal    Allergies Naproxen  Family History  Problem Relation Age of Onset  . Hypertension Mother   . Hypothyroidism Mother   . Asthma Mother   . Cancer Mother 78       Colon and rectal  . Rectal cancer Mother 28  . Colon cancer Mother 73  .  Hypotension Sister   . Hypothyroidism Sister   . Diabetes Sister   . Seizures Brother   . Hypothyroidism Sister   . Seizures Sister   . Anemia Sister   . Asthma Other   . Hypothyroidism Other   . Heart disease Paternal Grandfather        >55  . Stroke Maternal Uncle        > 55  . Deep vein thrombosis Sister   . Early death Neg Hx   . Hyperlipidemia Neg Hx   . Kidney disease Neg Hx   . Colon polyps Neg Hx   . Esophageal cancer Neg Hx   . Stomach cancer Neg Hx     Social History Social History   Tobacco Use  . Smoking status: Former Smoker    Years: 5.00    Types: Cigarettes    Last attempt to quit: 08/05/1999    Years since quitting: 19.0  . Smokeless tobacco: Never Used  . Tobacco comment: 2-5 cigarettes per day  Substance Use Topics  . Alcohol use: Not Currently  . Drug use: No    Review of Systems  All other systems negative except as documented in the HPI. All pertinent positives and negatives as reviewed in the HPI. ____________________________________________   PHYSICAL EXAM:  VITAL SIGNS: ED Triage Vitals  Enc Vitals Group     BP 08/21/18 2202 (!) 194/112     Pulse Rate 08/21/18 2202 78     Resp 08/21/18 2202 18     Temp 08/21/18 2202 98.4 F (36.9 C)     Temp Source 08/21/18 2202 Oral     SpO2 08/21/18 2202 96 %     Weight 08/21/18 2202 226 lb (102.5 kg)     Height 08/21/18 2202 5\' 9"  (1.753 m)    Constitutional: Alert and oriented. Well appearing and in no acute distress. Eyes: Conjunctivae are normal. PERRL. EOMI. Head: Atraumatic. Nose: No congestion/rhinnorhea. Mouth/Throat: Mucous membranes are moist.  Oropharynx non-erythematous. Neck: No stridor.  No meningeal signs.   Cardiovascular: Normal rate, regular rhythm. Good peripheral circulation. Grossly normal heart sounds.   Respiratory: Normal respiratory effort.  No retractions. Lungs CTAB. Gastrointestinal: Soft and nontender. No distention.  Musculoskeletal: No lower extremity  tenderness nor edema. No gross deformities of extremities. Neurologic:  Normal speech and language. No gross focal neurologic deficits are appreciated. No altered mental status, able to give full seemingly accurate history.  Face is symmetric, EOM's intact, pupils equal and reactive, vision intact, tongue and uvula midline without deviation. Upper and Lower extremity motor 5/5, intact pain perception in distal extremities, 2+ reflexes in biceps, patella and achilles tendons. Able to perform finger to nose normal with both hands. Walks without assistance or evident ataxia.  Skin:  Skin is warm, dry and intact. No rash noted.  ____________________________________________    INITIAL IMPRESSION / ASSESSMENT AND PLAN / ED COURSE  Will give home dose of antihypertensives and HA cocktail. No indication for imaging at this time. If ha doesn't improve,  then may consider it.   Pressure and headache improved.  Unclear which 1 because the other however patient significantly improved at this time.  Will refill for blood pressure medications at home and she will need PCP follow-up.  Pertinent labs & imaging results that were available during my care of the patient were reviewed by me and considered in my medical decision making (see chart for details).  ____________________________________________  FINAL CLINICAL IMPRESSION(S) / ED DIAGNOSES  Final diagnoses:  Hypertension, unspecified type     MEDICATIONS GIVEN DURING THIS VISIT:  Medications  nebivolol (BYSTOLIC) tablet 5 mg (has no administration in time range)  metoCLOPramide (REGLAN) injection 10 mg (10 mg Intravenous Given 08/22/18 0010)  diphenhydrAMINE (BENADRYL) injection 25 mg (25 mg Intravenous Given 08/22/18 0011)  dexamethasone (DECADRON) injection 10 mg (10 mg Intravenous Given 08/22/18 0012)  sodium chloride 0.9 % bolus 1,000 mL (1,000 mLs Intravenous New Bag/Given 08/22/18 0007)     NEW OUTPATIENT MEDICATIONS STARTED DURING THIS  VISIT:  New Prescriptions   NEBIVOLOL (BYSTOLIC) 5 MG TABLET    Take 1 tablet (5 mg total) by mouth daily.    Note:  This note was prepared with assistance of Dragon voice recognition software. Occasional wrong-word or sound-a-like substitutions may have occurred due to the inherent limitations of voice recognition software.   Terelle Dobler, Corene Cornea, MD 08/22/18 [phone]

## 2018-08-29 DIAGNOSIS — I10 Essential (primary) hypertension: Secondary | ICD-10-CM | POA: Insufficient documentation

## 2018-08-29 DIAGNOSIS — H539 Unspecified visual disturbance: Secondary | ICD-10-CM | POA: Diagnosis not present

## 2018-08-29 DIAGNOSIS — Z87891 Personal history of nicotine dependence: Secondary | ICD-10-CM | POA: Diagnosis not present

## 2018-08-29 DIAGNOSIS — R7303 Prediabetes: Secondary | ICD-10-CM | POA: Diagnosis not present

## 2018-08-29 DIAGNOSIS — R51 Headache: Secondary | ICD-10-CM | POA: Diagnosis not present

## 2018-08-29 DIAGNOSIS — R42 Dizziness and giddiness: Secondary | ICD-10-CM | POA: Diagnosis not present

## 2018-08-29 DIAGNOSIS — Z79899 Other long term (current) drug therapy: Secondary | ICD-10-CM | POA: Diagnosis not present

## 2018-08-30 ENCOUNTER — Encounter (HOSPITAL_COMMUNITY): Payer: Self-pay

## 2018-08-30 ENCOUNTER — Emergency Department (HOSPITAL_COMMUNITY)
Admission: EM | Admit: 2018-08-30 | Discharge: 2018-08-30 | Disposition: A | Discharge status: Home / Self Care | Payer: Federal, State, Local not specified - PPO | Attending: Emergency Medicine | Admitting: Emergency Medicine

## 2018-08-30 ENCOUNTER — Emergency Department (HOSPITAL_COMMUNITY): Payer: Federal, State, Local not specified - PPO

## 2018-08-30 ENCOUNTER — Other Ambulatory Visit: Payer: Self-pay

## 2018-08-30 DIAGNOSIS — R42 Dizziness and giddiness: Secondary | ICD-10-CM | POA: Diagnosis not present

## 2018-08-30 DIAGNOSIS — I10 Essential (primary) hypertension: Secondary | ICD-10-CM

## 2018-08-30 DIAGNOSIS — R51 Headache: Secondary | ICD-10-CM | POA: Diagnosis not present

## 2018-08-30 LAB — CBC WITH DIFFERENTIAL/PLATELET
ABS IMMATURE GRANULOCYTES: 0.02 10*3/uL (ref 0.00–0.07)
BASOS PCT: 1 %
Basophils Absolute: 0.1 10*3/uL (ref 0.0–0.1)
EOS ABS: 0.3 10*3/uL (ref 0.0–0.5)
Eosinophils Relative: 4 %
HCT: 43.9 % (ref 36.0–46.0)
Hemoglobin: 13.2 g/dL (ref 12.0–15.0)
Immature Granulocytes: 0 %
Lymphocytes Relative: 50 %
Lymphs Abs: 3.6 10*3/uL (ref 0.7–4.0)
MCH: 23.2 pg — ABNORMAL LOW (ref 26.0–34.0)
MCHC: 30.1 g/dL (ref 30.0–36.0)
MCV: 77.3 fL — ABNORMAL LOW (ref 80.0–100.0)
Monocytes Absolute: 0.9 10*3/uL (ref 0.1–1.0)
Monocytes Relative: 13 %
Neutro Abs: 2.3 10*3/uL (ref 1.7–7.7)
Neutrophils Relative %: 32 %
PLATELETS: 244 10*3/uL (ref 150–400)
RBC: 5.68 MIL/uL — ABNORMAL HIGH (ref 3.87–5.11)
RDW: 14.7 % (ref 11.5–15.5)
WBC: 7.2 10*3/uL (ref 4.0–10.5)
nRBC: 0 % (ref 0.0–0.2)

## 2018-08-30 LAB — BASIC METABOLIC PANEL
Anion gap: 6 (ref 5–15)
BUN: 10 mg/dL (ref 6–20)
CO2: 27 mmol/L (ref 22–32)
Calcium: 9.5 mg/dL (ref 8.9–10.3)
Chloride: 106 mmol/L (ref 98–111)
Creatinine, Ser: 0.7 mg/dL (ref 0.44–1.00)
GFR calc Af Amer: >60 mL/min (ref >60)
GFR calc non Af Amer: >60 mL/min (ref >60)
GLUCOSE: 101 mg/dL — AB (ref 70–99)
Potassium: 3.7 mmol/L (ref 3.5–5.1)
Sodium: 139 mmol/L (ref 135–145)

## 2018-08-30 LAB — TROPONIN I: Troponin I: <0.03 ng/mL (ref <0.03)

## 2018-08-30 MED ORDER — ACETAMINOPHEN 325 MG PO TABS
650.0000 mg | ORAL_TABLET | Freq: Once | ORAL | Status: AC
  Administered 2018-08-30: 650 mg via ORAL
  Filled 2018-08-30: qty 2

## 2018-08-30 MED ORDER — METOCLOPRAMIDE HCL 5 MG/ML IJ SOLN
10.0000 mg | Freq: Once | INTRAMUSCULAR | Status: AC
  Administered 2018-08-30: 10 mg via INTRAVENOUS
  Filled 2018-08-30: qty 2

## 2018-08-30 MED ORDER — SODIUM CHLORIDE (PF) 0.9 % IJ SOLN
INTRAMUSCULAR | Status: AC
  Filled 2018-08-30: qty 50

## 2018-08-30 MED ORDER — MECLIZINE HCL 25 MG PO TABS
25.0000 mg | ORAL_TABLET | Freq: Once | ORAL | Status: AC
  Administered 2018-08-30: 25 mg via ORAL
  Filled 2018-08-30: qty 1

## 2018-08-30 MED ORDER — DIPHENHYDRAMINE HCL 50 MG/ML IJ SOLN
25.0000 mg | Freq: Once | INTRAMUSCULAR | Status: AC
  Administered 2018-08-30: 25 mg via INTRAVENOUS
  Filled 2018-08-30: qty 1

## 2018-08-30 MED ORDER — ONDANSETRON 4 MG PO TBDP
4.0000 mg | ORAL_TABLET | Freq: Once | ORAL | Status: AC
  Administered 2018-08-30: 4 mg via ORAL
  Filled 2018-08-30: qty 1

## 2018-08-30 MED ORDER — IOPAMIDOL (ISOVUE-370) INJECTION 76%
INTRAVENOUS | Status: AC
  Filled 2018-08-30: qty 100

## 2018-08-30 MED ORDER — IOPAMIDOL (ISOVUE-370) INJECTION 76%
100.0000 mL | Freq: Once | INTRAVENOUS | Status: AC | PRN
  Administered 2018-08-30: 100 mL via INTRAVENOUS

## 2018-08-30 MED ORDER — KETOROLAC TROMETHAMINE 30 MG/ML IJ SOLN
30.0000 mg | Freq: Once | INTRAMUSCULAR | Status: AC
  Administered 2018-08-30: 30 mg via INTRAVENOUS
  Filled 2018-08-30: qty 1

## 2018-08-30 NOTE — Discharge Instructions (Signed)
Take your blood pressure medication as prescribed.  Keep a record of your blood pressure every day for your doctor to follow and change your medication as needed.  Your work-up was negative today. You declined MRI but suspicion for stroke is low.  Return to the ED with new or worsening symptoms including unilateral weakness, numbness, tingling, difficulty speaking, difficulty swallowing, chest pain, shortness of breath or any other concerns.

## 2018-08-30 NOTE — ED Triage Notes (Signed)
Pt reports continued hypertension. She was seen on 1/18 with similar complaints, but states that she still doesn't feel right. She is reporting dizziness, headache and some blurry vision. States that she has been compliant with all medications. No unilateral weakness, facial droop, or speech difficulties. A&Ox4. Ambulatory.

## 2018-08-30 NOTE — ED Provider Notes (Signed)
[place] DEPT Provider Note   CSN: [id number] Arrival date & time: 08/29/18  2353     History   Chief Complaint Chief Complaint  Patient presents with  . Hypertension    HPI GAYLE MARTINEZ is a 53 y.o. female.  Patient presents with frontal headache, blurry vision and dizziness onset 2 days ago.  Reports she had a frontal onset headache yesterday that did not improve with ibuprofen but did subside throughout the day.  Today she woke up with a headache again in the front of her head associate with bilateral blurry vision.  She cannot state with the headache was sudden onset as she woke up with it.  It is not the worst headache she ever had.  She has a feeling of blurry vision as well as dizziness when she tries to walk.  She denies any room spinning however.  No focal weakness, numbness or tingling.  No difficulty speaking or difficulty swallowing.  Patient was seen in the ED on January 18 for headache and elevated blood pressure as well.  She was out of her Bystolic at that time but has been taking it since.  She reports she has not checked her blood pressure until today when it was 145/112 and she became concerned.  She reports other than the past 2 days she has not had any headache or blurry vision or dizziness.  Denies any photophobia or phonophobia.  No fever.  The history is provided by the patient.  Hypertension  Associated symptoms include headaches. Pertinent negatives include no abdominal pain and no shortness of breath.    Past Medical History:  Diagnosis Date  . Allergy   . Anemia   . Anxiety   . Arthritis    arm/ wrist areas  . B12 deficiency    resolved  . Constipation    not chronic- now stools soft and regular   . Depression    no meds  . Family history of adverse reaction to anesthesia    one sister ponv and slow to awaken  . History of kidney stones   . Hypertension   . Urinary incontinence wears depends    Patient  Active Problem List   Diagnosis Date Noted  . Nausea 07/09/2018  . Chronic low back pain 06/22/2018  . Severe obesity ([place]) 06/22/2018  . ASCUS pap with negative high risk HPV on 06/30/16 06/30/2016  . Depression with anxiety 12/28/2015  . De Quervain's tenosynovitis, left 06/21/2015  . Prediabetes 11/02/2014  . Anemia, iron deficiency 09/17/2013  . Routine general medical examination at a health care facility 01/12/2013  . Allergic rhinitis 01/12/2013  . Constipation, chronic 10/04/2012  . Visit for screening mammogram 01/12/2012  . B12 deficiency anemia 01/09/2012  . Obesity (BMI 30-39.9) 01/09/2012  . Essential hypertension, benign 11/24/2011    Past Surgical History:  Procedure Laterality Date  . BUNIONECTOMY Bilateral   . caesarean section     x 1  . cystoscopic     extraction of ureteric calculus without disintegration  . CYSTOSCOPY W/ RETROGRADES Left 06/28/2018   Procedure: CYSTOSCOPY WITH RETROGRADE PYELOGRAM LEFT STENT;  Surgeon: Ceasar Mons, MD;  Location: [facility];  Service: Urology;  Laterality: Left;  . CYSTOSCOPY/URETEROSCOPY/HOLMIUM LASER/STENT PLACEMENT Left 07/16/2018   Procedure: CYSTOSCOPY/URETEROSCOPY/HOLMIUM LASER/STENT EXCHANGE;  Surgeon: Ceasar Mons, MD;  Location: [facility];  Service: Urology;  Laterality: Left;  . DILITATION & CURRETTAGE/HYSTROSCOPY WITH NOVASURE ABLATION N/A 06/17/2013   Procedure: DILATATION &  CURETTAGE/HYSTEROSCOPY WITH NOVASURE ABLATION;  Surgeon: Osborne Oman, MD;  Location: [facility];  Service: Gynecology;  Laterality: N/A;  . 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 HEMORRHOIDECTOMY WITH HEMORRHOID BANDING  10/19/2012   [facility] [place] Hem ligation/pexy  . TUBAL LIGATION    . WISDOM TOOTH EXTRACTION       OB History    Gravida  7   Para  5     Term  4   Preterm  1   AB  2   Living  5     SAB  0   TAB  2   Ectopic  0   Multiple  0   Live Births  5            Home Medications    Prior to Admission medications   Medication Sig Start Date End Date Taking? Authorizing Provider  acetaminophen (TYLENOL) 500 MG tablet Take 1,000 mg by mouth every 6 (six) hours as needed for mild pain.     [provider]  albuterol (PROVENTIL HFA;VENTOLIN HFA) 108 (90 Base) MCG/ACT inhaler Inhale 1-2 puffs into the lungs every 6 (six) hours as needed for wheezing or shortness of breath. 10/24/16   Nche, Charlene Brooke, NP  calcium citrate (CALCITRATE - DOSED IN MG ELEMENTAL CALCIUM) 950 MG tablet Take 200 mg of elemental calcium by mouth daily.    [provider]  escitalopram (LEXAPRO) 10 MG tablet Take 1 tablet (10 mg total) by mouth daily. 02/10/18   [name], MD  HYDROcodone-acetaminophen (NORCO) 5-325 MG tablet Take 1 tablet by mouth every 4 (four) hours as needed for moderate pain. 07/16/18   Ceasar Mons, MD  Multiple Vitamins-Minerals (MULTIVITAMIN WITH MINERALS) tablet Take 1 tablet by mouth 3 (three) times daily. Bariatric chewable    [provider]  nebivolol (BYSTOLIC) 5 MG tablet Take 1 tablet (5 mg total) by mouth daily. 08/22/18   Mesner, Corene Cornea, MD  omeprazole (PRILOSEC) 40 MG capsule Take 40 mg by mouth daily.    [provider]  oxybutynin (DITROPAN) 5 MG tablet Take 1 tablet (5 mg total) by mouth every 8 (eight) hours as needed for bladder spasms. 07/16/18   Ceasar Mons, MD  phenazopyridine (PYRIDIUM) 200 MG tablet Take 1 tablet (200 mg total) by mouth 3 (three) times daily as needed (for pain with urination). 07/16/18 07/16/19  Ceasar Mons, MD  potassium chloride (K-DUR,KLOR-CON) 10 MEQ tablet Take 1 tablet (10 mEq total) by mouth 2 (two) times daily. Patient taking differently: Take 10 mEq by mouth daily.  07/23/17   [name], MD   potassium chloride (K-DUR) 10 MEQ tablet Take 1 tablet (10 mEq total) by mouth 2 (two) times daily. 05/13/12 06/27/13  [name], MD    Family History Family History  Problem Relation Age of Onset  . Hypertension Mother   . Hypothyroidism Mother   . Asthma Mother   . Cancer Mother 69       Colon and rectal  . Rectal cancer Mother 55  . Colon cancer Mother 27  . Hypotension Sister   . Hypothyroidism Sister   . Diabetes Sister   . Seizures Brother   . Hypothyroidism Sister   . Seizures Sister   . Anemia Sister   . Asthma Other   . Hypothyroidism Other   .  Heart disease Paternal Grandfather        >55  . Stroke Maternal Uncle        > 55  . Deep vein thrombosis Sister   . Early death Neg Hx   . Hyperlipidemia Neg Hx   . Kidney disease Neg Hx   . Colon polyps Neg Hx   . Esophageal cancer Neg Hx   . Stomach cancer Neg Hx     Social History Social History   Tobacco Use  . Smoking status: Former Smoker    Years: 5.00    Types: Cigarettes    Last attempt to quit: 08/05/1999    Years since quitting: 19.0  . Smokeless tobacco: Never Used  . Tobacco comment: 2-5 cigarettes per day  Substance Use Topics  . Alcohol use: Not Currently  . Drug use: No     Allergies   Naproxen   Review of Systems Review of Systems  Constitutional: Negative for activity change, appetite change and fever.  HENT: Negative for congestion.   Eyes: Positive for visual disturbance.  Respiratory: Negative for cough, chest tightness and shortness of breath.   Gastrointestinal: Negative for abdominal pain, nausea and vomiting.  Genitourinary: Negative for dysuria and hematuria.  Musculoskeletal: Negative for arthralgias and myalgias.  Skin: Negative for rash.  Neurological: Positive for dizziness, light-headedness and headaches. Negative for seizures, syncope, facial asymmetry and weakness.    all other systems are negative except as noted in the HPI and PMH.    Physical  Exam Updated Vital Signs BP (!) 175/117 (BP Location: Left Arm)   Pulse 64   Temp 97.9 F (36.6 C) (Oral)   Resp 16   Ht 5\' 9"  (1.753 m)   Wt 99.2 kg   SpO2 100%   BMI 32.31 kg/m   Physical Exam Vitals signs and nursing note reviewed.  Constitutional:      General: She is not in acute distress.    Appearance: She is well-developed.  HENT:     Head: Normocephalic and atraumatic.     Mouth/Throat:     Pharynx: No oropharyngeal exudate.  Eyes:     Conjunctiva/sclera: Conjunctivae normal.     Pupils: Pupils are equal, round, and reactive to light.  Neck:     Musculoskeletal: Normal range of motion and neck supple.     Comments: No meningismus. Cardiovascular:     Rate and Rhythm: Normal rate and regular rhythm.     Heart sounds: Normal heart sounds. No murmur.  Pulmonary:     Effort: Pulmonary effort is normal. No respiratory distress.     Breath sounds: Normal breath sounds.  Abdominal:     Palpations: Abdomen is soft.     Tenderness: There is no abdominal tenderness. There is no guarding or rebound.  Musculoskeletal: Normal range of motion.        General: No tenderness.  Skin:    General: Skin is warm.  Neurological:     Mental Status: She is alert and oriented to person, place, and time.     Cranial Nerves: No cranial nerve deficit.     Motor: No abnormal muscle tone.     Coordination: Coordination normal.     Comments: CN 2-12 intact, no ataxia on finger to nose, no nystagmus, 5/5 strength throughout, no pronator drift, Romberg negative, normal gait. No nystagmus.  No ataxia on finger-to-nose.  Head impulse testing negative.  Test of skew negative.  Patient with negative Romberg and normal gait.  Psychiatric:  Behavior: Behavior normal.      ED Treatments / Results  Labs (all labs ordered are listed, but only abnormal results are displayed) Labs Reviewed  CBC WITH DIFFERENTIAL/PLATELET - Abnormal; Notable for the following components:      Result  Value   RBC 5.68 (*)    MCV 77.3 (*)    MCH 23.2 (*)    All other components within normal limits  BASIC METABOLIC PANEL - Abnormal; Notable for the following components:   Glucose, Bld 101 (*)    All other components within normal limits  TROPONIN I    EKG EKG Interpretation  Date/Time:  Monday August 30 2018 01:06:37 EST Ventricular Rate:  59 PR Interval:    QRS Duration: 78 QT Interval:  419 QTC Calculation: 415 R Axis:   34 Text Interpretation:  Sinus rhythm No significant change was found Confirmed by Ezequiel Essex [phone]) on 08/30/2018 1:17:16 AM   Radiology Ct Angio Head W Or Wo Contrast  Result Date: 08/30/2018 CLINICAL DATA:  Dizziness and headache EXAM: CT ANGIOGRAPHY HEAD AND NECK TECHNIQUE: Multidetector CT imaging of the head and neck was performed using the standard protocol during bolus administration of intravenous contrast. Multiplanar CT image reconstructions and MIPs were obtained to evaluate the vascular anatomy. Carotid stenosis measurements (when applicable) are obtained utilizing NASCET criteria, using the distal internal carotid diameter as the denominator. CONTRAST:  168mL ISOVUE-370 IOPAMIDOL (ISOVUE-370) INJECTION 76% COMPARISON:  None. FINDINGS: CT HEAD FINDINGS Brain: No evidence of infarction, hemorrhage, hydrocephalus, extra-axial collection or mass lesion/mass effect. Vascular: See below Skull: Negative Sinuses: Negative Orbits: Negative Review of the MIP images confirms the above findings CTA NECK FINDINGS Aortic arch: No abnormality noted.  Four vessel branching. Right carotid system: Vessels are smooth and widely patent. No atheromatous changes. Left carotid system: Vessels are smooth and widely patent. No atheromatous changes. Vertebral arteries: No proximal subclavian or vertebral stenosis or irregularity. Skeleton: Negative Other neck:. High-density retropharyngeal nodule, larger on the left, likely lymph node. No hypercalcemia. No generalized  adenopathy. Upper chest: Negative Review of the MIP images confirms the above findings CTA HEAD FINDINGS Anterior circulation: Symmetric carotid arteries. No stenosis, atherosclerosis, or intradural aneurysm. There is a tiny triangular outpouching from the mid left cavernous ICA, likely meningeal diverticulum. Asymmetric right cavernous sinus enhancement but no distension of this structure and no superior ophthalmic vein asymmetric enhancement. This is more likely related to differences in contrast finding rather than pathology. Posterior circulation: Vessels are smooth and widely patent. Negative for aneurysm. Venous sinuses: Patent, as above. Anatomic variants: None significant Delayed phase: No abnormal intracranial enhancement Review of the MIP images confirms the above findings IMPRESSION: No acute finding or specific explanation for headache. Electronically Signed   By: Monte Fantasia M.D.   On: 08/30/2018 04:16   Ct Angio Neck W And/or Wo Contrast  Result Date: 08/30/2018 CLINICAL DATA:  Dizziness and headache EXAM: CT ANGIOGRAPHY HEAD AND NECK TECHNIQUE: Multidetector CT imaging of the head and neck was performed using the standard protocol during bolus administration of intravenous contrast. Multiplanar CT image reconstructions and MIPs were obtained to evaluate the vascular anatomy. Carotid stenosis measurements (when applicable) are obtained utilizing NASCET criteria, using the distal internal carotid diameter as the denominator. CONTRAST:  178mL ISOVUE-370 IOPAMIDOL (ISOVUE-370) INJECTION 76% COMPARISON:  None. FINDINGS: CT HEAD FINDINGS Brain: No evidence of infarction, hemorrhage, hydrocephalus, extra-axial collection or mass lesion/mass effect. Vascular: See below Skull: Negative Sinuses: Negative Orbits: Negative Review of the MIP images  confirms the above findings CTA NECK FINDINGS Aortic arch: No abnormality noted.  Four vessel branching. Right carotid system: Vessels are smooth and widely  patent. No atheromatous changes. Left carotid system: Vessels are smooth and widely patent. No atheromatous changes. Vertebral arteries: No proximal subclavian or vertebral stenosis or irregularity. Skeleton: Negative Other neck:. High-density retropharyngeal nodule, larger on the left, likely lymph node. No hypercalcemia. No generalized adenopathy. Upper chest: Negative Review of the MIP images confirms the above findings CTA HEAD FINDINGS Anterior circulation: Symmetric carotid arteries. No stenosis, atherosclerosis, or intradural aneurysm. There is a tiny triangular outpouching from the mid left cavernous ICA, likely meningeal diverticulum. Asymmetric right cavernous sinus enhancement but no distension of this structure and no superior ophthalmic vein asymmetric enhancement. This is more likely related to differences in contrast finding rather than pathology. Posterior circulation: Vessels are smooth and widely patent. Negative for aneurysm. Venous sinuses: Patent, as above. Anatomic variants: None significant Delayed phase: No abnormal intracranial enhancement Review of the MIP images confirms the above findings IMPRESSION: No acute finding or specific explanation for headache. Electronically Signed   By: Monte Fantasia M.D.   On: 08/30/2018 04:16    Procedures Procedures (including critical care time)  Medications Ordered in ED Medications  acetaminophen (TYLENOL) tablet 650 mg (has no administration in time range)     Initial Impression / Assessment and Plan / ED Course  I have reviewed the triage vital signs and the nursing notes.  Pertinent labs & imaging results that were available during my care of the patient were reviewed by me and considered in my medical decision making (see chart for details).    Headache with elevated blood pressure and blurry vision.  No focal neurological deficits.  Hypertensive on arrival with nonfocal neuro exam.  Labs are reassuring without evidence of  endorgan damage.  Blood pressure did spontaneously improved to 659 systolic without additional medications. EKG is normal sinus rhythm.  Nonfocal logical exam.  No nystagmus.  Patient is able to ambulate and has no ataxia on finger-to-nose.  Low suspicion for posterior circulation CVA.  CT head is negative. CTA shows no aneurysm or posterior circulation stenosis.   Discussed with patient the clinical suspicion for stroke is low but cannot be completely ruled out without MRI which is unavailable at this facility at this time of the day.  She declines transfer for MRI.  She is able to tolerate p.o. and ambulatory.  Her headache and dizziness have improved with medications in the ED.  Advised to keep record of blood pressure and follow-up with her doctor for medication adjustments.  Return precautions discussed including unilateral weakness, numbness, tingling, difficulty speaking or difficulty swallowing or other concerns.  Final Clinical Impressions(s) / ED Diagnoses   Final diagnoses:  Essential hypertension    ED Discharge Orders    None       Reighan Hipolito, Annie Main, MD 08/30/18 [phone]

## 2018-08-31 ENCOUNTER — Ambulatory Visit: Payer: Self-pay | Admitting: *Deleted

## 2018-08-31 ENCOUNTER — Ambulatory Visit: Payer: Self-pay | Admitting: Physician Assistant

## 2018-08-31 ENCOUNTER — Emergency Department (HOSPITAL_COMMUNITY)
Admission: EM | Admit: 2018-08-31 | Discharge: 2018-08-31 | Discharge status: Against Medical Advice | Payer: Federal, State, Local not specified - PPO

## 2018-08-31 NOTE — Telephone Encounter (Signed)
This encounter was created in error - please disregard.

## 2018-08-31 NOTE — Addendum Note (Signed)
Addended by: Elliot Cousin on: 08/31/2018 01:14 PM   Modules accepted: Level of Service, SmartSet

## 2018-08-31 NOTE — Telephone Encounter (Signed)
See note

## 2018-08-31 NOTE — Telephone Encounter (Signed)
FYI,  Pt going back to ED. Please see triage summary.

## 2018-08-31 NOTE — Telephone Encounter (Signed)
Mary Sanford, pt was called by a scheduler up front and told to go to the ED per Dr. Juleen China. She was taken off my schedule.

## 2018-08-31 NOTE — Telephone Encounter (Signed)
Pt reports BP this am 174/106, prior to taking meds.  States 1 hr after meds "Went higher"; can not recall values. Pt seen in ED 08/22/2018 and yesterday for BPs 175/117 and 194/112. Pt on Bystolic 5 mg, states no missed doses but ED note of 1/19 reports pt stated she  had been out of the medication.  Pt was given Bystolic 5mg , decadron 10mg  IV.   Pt states complete workup and "All negative, meds brought down my pressure."  Pt did however refuse MRI.    Presently with HA 4/10, lightheaded "Spinning at times", visual changes; ongoing symptoms past week.   Denies any facial droop, speech clear during call, no weakness, sob, CP. Pt states she was on HCTZ in past but was discontinued when she had bariatric surgery.  No availability at [facility]; Sam at [place] suggested another practice, pt requesting [facility]. [place] called and spoke with [facility]; appt secured with S.Worley for today. Pt is at work presently, will have daughter drive her.   Reason for Disposition . Systolic BP  >= 094 OR Diastolic >= 709    Seen in ED yesterday and 08/22/2018; same symptoms  Answer Assessment - Initial Assessment Questions 1. BLOOD PRESSURE: "What is the blood pressure?" "Did you take at least two measurements 5 minutes apart?"     This am before meds 174/106. States "Went up" after meds, does not recall values 2. ONSET: "When did you take your blood pressure?"     This am 0800, 0900 3. HOW: "How did you obtain the blood pressure?" (e.g., visiting nurse, automatic home BP monitor)     Home monitor 4. HISTORY: "Do you have a history of high blood pressure?"     yes 5. MEDICATIONS: "Are you taking any medications for blood pressure?" "Have you missed any doses recently?"     no 6. OTHER SYMPTOMS: "Do you have any symptoms?" (e.g., headache, chest pain, blurred vision, difficulty breathing, weakness)     Headache, 4/10, lightheaded, spinning,vision blurry  Protocols used: HIGH BLOOD PRESSURE-A-AH

## 2018-09-01 ENCOUNTER — Ambulatory Visit (INDEPENDENT_AMBULATORY_CARE_PROVIDER_SITE_OTHER): Payer: Federal, State, Local not specified - PPO | Admitting: Internal Medicine

## 2018-09-01 ENCOUNTER — Encounter: Payer: Self-pay | Admitting: Internal Medicine

## 2018-09-01 VITALS — BP 170/110 | HR 66 | Temp 98.5°F | Ht 69.0 in | Wt 219.0 lb

## 2018-09-01 DIAGNOSIS — R7303 Prediabetes: Secondary | ICD-10-CM | POA: Diagnosis not present

## 2018-09-01 DIAGNOSIS — F418 Other specified anxiety disorders: Secondary | ICD-10-CM | POA: Diagnosis not present

## 2018-09-01 DIAGNOSIS — I10 Essential (primary) hypertension: Secondary | ICD-10-CM | POA: Diagnosis not present

## 2018-09-01 LAB — POCT GLYCOSYLATED HEMOGLOBIN (HGB A1C): Hemoglobin A1C: 5.5 % (ref 4.0–5.6)

## 2018-09-01 MED ORDER — TRIAMTERENE-HCTZ 37.5-25 MG PO TABS: 1.0000 | ORAL_TABLET | Freq: Every day | ORAL | 0 refills | Status: DC

## 2018-09-01 MED ORDER — ESCITALOPRAM OXALATE 20 MG PO TABS: 20.0000 mg | ORAL_TABLET | Freq: Every day | ORAL | 1 refills | Status: DC

## 2018-09-01 NOTE — Patient Instructions (Signed)

## 2018-09-01 NOTE — Progress Notes (Signed)
Subjective:  Patient ID: Mary Sanford, female    DOB: [birth year]  Age: 53 y.o. MRN: [id number]  CC: Hypertension and Diabetes   HPI Mary Sanford presents for f/up - He complains of a 3-week history of mild headache, blurred vision, lightheadedness, and dizziness.  She was seen in the ED recently and evaluated but her high blood pressure was not treated.  She tells me she is compliant with nebivolol.  She also complains of recent emotional stressors that have caused insomnia, anxiety, anhedonia, and crying spells.  She wants to increase the dose of escitalopram.  Outpatient Medications Prior to Visit  Medication Sig Dispense Refill  . acetaminophen (TYLENOL) 500 MG tablet Take 1,000 mg by mouth every 6 (six) hours as needed for mild pain.     Marland Kitchen albuterol (PROVENTIL HFA;VENTOLIN HFA) 108 (90 Base) MCG/ACT inhaler Inhale 1-2 puffs into the lungs every 6 (six) hours as needed for wheezing or shortness of breath. 1 Inhaler 0  . calcium citrate (CALCITRATE - DOSED IN MG ELEMENTAL CALCIUM) 950 MG tablet Take 200 mg of elemental calcium by mouth daily.    . Multiple Vitamins-Minerals (MULTIVITAMIN WITH MINERALS) tablet Take 1 tablet by mouth daily. Bariatric chewable     . nebivolol (BYSTOLIC) 5 MG tablet Take 1 tablet (5 mg total) by mouth daily. 30 tablet 1  . potassium chloride (K-DUR,KLOR-CON) 10 MEQ tablet Take 1 tablet (10 mEq total) by mouth 2 (two) times daily. (Patient taking differently: Take 10 mEq by mouth daily. ) 180 tablet 1  . escitalopram (LEXAPRO) 10 MG tablet Take 1 tablet (10 mg total) by mouth daily. 90 tablet 1  . HYDROcodone-acetaminophen (NORCO) 5-325 MG tablet Take 1 tablet by mouth every 4 (four) hours as needed for moderate pain. (Patient not taking: Reported on 08/30/2018) 20 tablet 0  . oxybutynin (DITROPAN) 5 MG tablet Take 1 tablet (5 mg total) by mouth every 8 (eight) hours as needed for bladder spasms. (Patient not taking: Reported on 08/30/2018) 30 tablet 1  .  phenazopyridine (PYRIDIUM) 200 MG tablet Take 1 tablet (200 mg total) by mouth 3 (three) times daily as needed (for pain with urination). (Patient not taking: Reported on 08/30/2018) 30 tablet 0   No facility-administered medications prior to visit.     ROS Review of Systems  Constitutional: Negative for diaphoresis, fatigue and unexpected weight change.  HENT: Negative.   Eyes: Positive for visual disturbance.  Respiratory: Negative for apnea, cough, chest tightness, shortness of breath and wheezing.   Cardiovascular: Negative for chest pain, palpitations and leg swelling.  Gastrointestinal: Negative for abdominal pain, diarrhea and nausea.  Genitourinary: Negative.  Negative for difficulty urinating, dysuria and hematuria.  Musculoskeletal: Negative.  Negative for arthralgias, back pain and neck pain.  Skin: Negative.   Neurological: Positive for dizziness, light-headedness and headaches. Negative for tremors, seizures, syncope, weakness and numbness.  Hematological: Negative for adenopathy. Does not bruise/bleed easily.  Psychiatric/Behavioral: Positive for dysphoric mood and sleep disturbance. Negative for agitation, behavioral problems, confusion, decreased concentration, hallucinations, self-injury and suicidal ideas. The patient is nervous/anxious. The patient is not hyperactive.     Objective:  BP (!) 170/110 (BP Location: Left Arm, Patient Position: Sitting, Cuff Size: Large)   Pulse 66   Temp 98.5 F (36.9 C) (Oral)   Ht 5\' 9"  (1.753 m)   Wt 219 lb (99.3 kg)   LMP 07/14/2018   SpO2 98%   BMI 32.34 kg/m   BP Readings from Last 3 Encounters:  09/01/18 (!) 170/110  08/30/18 (!) 154/93  08/22/18 (!) 151/85    Wt Readings from Last 3 Encounters:  09/01/18 219 lb (99.3 kg)  08/30/18 218 lb 12.8 oz (99.2 kg)  08/21/18 226 lb (102.5 kg)    Physical Exam Constitutional:      Appearance: She is not ill-appearing or diaphoretic.  HENT:     Nose: Nose normal. No  congestion or rhinorrhea.     Mouth/Throat:     Mouth: Mucous membranes are moist.     Pharynx: Oropharynx is clear. No oropharyngeal exudate or posterior oropharyngeal erythema.  Eyes:     General: No scleral icterus.       Right eye: No discharge.        Left eye: No discharge.     Extraocular Movements: Extraocular movements intact.     Conjunctiva/sclera: Conjunctivae normal.     Pupils: Pupils are equal, round, and reactive to light.  Neck:     Musculoskeletal: Normal range of motion and neck supple.  Cardiovascular:     Rate and Rhythm: Normal rate and regular rhythm.     Heart sounds: No murmur. No gallop.   Pulmonary:     Effort: Pulmonary effort is normal.     Breath sounds: No stridor. No wheezing, rhonchi or rales.  Abdominal:     General: Bowel sounds are normal.     Palpations: There is no hepatomegaly, splenomegaly or mass.     Tenderness: There is no abdominal tenderness. There is no guarding.  Musculoskeletal: Normal range of motion.        General: No swelling.     Right lower leg: No edema.     Left lower leg: No edema.  Skin:    General: Skin is warm.     Coloration: Skin is not pale.     Findings: No erythema or rash.  Neurological:     General: No focal deficit present.     Mental Status: She is alert and oriented to person, place, and time. Mental status is at baseline.  Psychiatric:        Attention and Perception: Attention normal.        Mood and Affect: Mood is anxious and depressed. Affect is tearful.        Speech: Speech normal.        Behavior: Behavior normal. Behavior is cooperative.        Thought Content: Thought content normal. Thought content is not paranoid or delusional. Thought content does not include homicidal or suicidal ideation.        Cognition and Memory: Cognition normal.        Judgment: Judgment normal.     Lab Results  Component Value Date   WBC 7.2 08/30/2018   HGB 13.2 08/30/2018   HCT 43.9 08/30/2018   PLT 244  08/30/2018   GLUCOSE 101 (H) 08/30/2018   CHOL 135 04/01/2017   TRIG 93.0 04/01/2017   HDL 38.30 (L) 04/01/2017   LDLCALC 78 04/01/2017   ALT 69 (H) 06/28/2018   AST 33 06/28/2018   NA 139 08/30/2018   K 3.7 08/30/2018   CL 106 08/30/2018   CREATININE 0.70 08/30/2018   BUN 10 08/30/2018   CO2 27 08/30/2018   TSH 1.21 04/01/2017   HGBA1C 5.5 09/01/2018    No results found.  Assessment & Plan:   Mary Sanford was seen today for hypertension and diabetes.  Diagnoses and all orders for this visit:  Essential hypertension, benign-  She has a history of mild hypokalemia so I will treat her hypertension with a combination of triamterene and hydrochlorothiazide. -     triamterene-hydrochlorothiazide (MAXZIDE-25) 37.5-25 MG tablet; Take 1 tablet by mouth daily.  Depression with anxiety -     escitalopram (LEXAPRO) 20 MG tablet; Take 1 tablet (20 mg total) by mouth daily.  Prediabetes- She is status post bariatric surgery and her blood sugars are normal now. -     POCT glycosylated hemoglobin (Hb A1C)   I have discontinued Mary Sanford's escitalopram, HYDROcodone-acetaminophen, oxybutynin, and phenazopyridine. I am also having her start on triamterene-hydrochlorothiazide and escitalopram. Additionally, I am having her maintain her albuterol, acetaminophen, potassium chloride, multivitamin with minerals, calcium citrate, and nebivolol.  Meds ordered this encounter  Medications  . triamterene-hydrochlorothiazide (MAXZIDE-25) 37.5-25 MG tablet    Sig: Take 1 tablet by mouth daily.    Dispense:  90 tablet    Refill:  0  . escitalopram (LEXAPRO) 20 MG tablet    Sig: Take 1 tablet (20 mg total) by mouth daily.    Dispense:  90 tablet    Refill:  1     Follow-up: Return in about 4 weeks (around 09/29/2018).  Scarlette Calico, MD

## 2018-09-21 ENCOUNTER — Ambulatory Visit: Payer: Self-pay | Admitting: Internal Medicine

## 2018-09-29 ENCOUNTER — Ambulatory Visit: Payer: Federal, State, Local not specified - PPO | Admitting: Internal Medicine

## 2018-10-07 ENCOUNTER — Other Ambulatory Visit: Payer: Self-pay | Admitting: General Surgery

## 2018-10-08 ENCOUNTER — Encounter (HOSPITAL_COMMUNITY)
Admission: RE | Admit: 2018-10-08 | Discharge: 2018-10-08 | Disposition: A | Discharge status: Home / Self Care | Payer: Federal, State, Local not specified - PPO | Source: Ambulatory Visit | Attending: General Surgery | Admitting: General Surgery

## 2018-10-08 DIAGNOSIS — E860 Dehydration: Secondary | ICD-10-CM | POA: Diagnosis present

## 2018-10-08 MED ORDER — ONDANSETRON HCL 4 MG/2ML IJ SOLN: 4.0000 mg | INTRAMUSCULAR | Status: DC | PRN

## 2018-10-08 MED ORDER — SODIUM CHLORIDE 0.9 % IV BOLUS
1000.0000 mL | Freq: Once | INTRAVENOUS | Status: AC
  Administered 2018-10-08: 1000 mL via INTRAVENOUS

## 2018-10-08 MED ORDER — THIAMINE HCL 100 MG/ML IJ SOLN
Freq: Once | INTRAVENOUS | Status: AC
  Administered 2018-10-08: 10:00:00 via INTRAVENOUS
  Filled 2018-10-08: qty 1000

## 2018-10-08 MED ORDER — PANTOPRAZOLE SODIUM 40 MG PO PACK
40.0000 mg | PACK | Freq: Every day | ORAL | Status: DC
  Filled 2018-10-08: qty 20

## 2018-10-08 MED ORDER — SODIUM CHLORIDE 0.9 % IV SOLN: INTRAVENOUS | Status: DC

## 2018-10-08 MED ORDER — ONDANSETRON 4 MG PO TBDP: 4.0000 mg | ORAL_TABLET | ORAL | Status: DC | PRN

## 2018-11-15 DIAGNOSIS — J029 Acute pharyngitis, unspecified: Secondary | ICD-10-CM | POA: Diagnosis not present

## 2018-11-16 ENCOUNTER — Encounter: Payer: Self-pay | Admitting: Internal Medicine

## 2018-11-16 ENCOUNTER — Ambulatory Visit: Payer: Self-pay

## 2018-11-16 ENCOUNTER — Ambulatory Visit (INDEPENDENT_AMBULATORY_CARE_PROVIDER_SITE_OTHER): Payer: Federal, State, Local not specified - PPO | Admitting: Internal Medicine

## 2018-11-16 DIAGNOSIS — B370 Candidal stomatitis: Secondary | ICD-10-CM | POA: Diagnosis not present

## 2018-11-16 DIAGNOSIS — I10 Essential (primary) hypertension: Secondary | ICD-10-CM | POA: Diagnosis not present

## 2018-11-16 DIAGNOSIS — K120 Recurrent oral aphthae: Secondary | ICD-10-CM | POA: Diagnosis not present

## 2018-11-16 MED ORDER — CLOTRIMAZOLE 10 MG MT TROC: 10.0000 mg | Freq: Every day | OROMUCOSAL | 0 refills | Status: AC

## 2018-11-16 MED ORDER — PREDNISOLONE 15 MG/5ML PO SOLN: 15.0000 mg | Freq: Two times a day (BID) | ORAL | 0 refills | Status: DC

## 2018-11-16 MED ORDER — NEBIVOLOL HCL 5 MG PO TABS: 5.0000 mg | ORAL_TABLET | Freq: Every day | ORAL | 1 refills | Status: DC

## 2018-11-16 NOTE — Progress Notes (Signed)
Virtual Visit via Video Note  I connected with Mary Sanford on 11/16/18 at  4:00 PM EDT by a video enabled telemedicine application and verified that I am speaking with the correct person using two identifiers.   I discussed the limitations of evaluation and management by telemedicine and the availability of in person appointments. The patient expressed understanding and agreed to proceed.  History of Present Illness: She checked in for virtual visit.  She was not willing to come in because of the COVID-19 pandemic.  1 day prior to this visit she was seen at an urgent care center for sore throat.  She tested negative for strep and was prescribed viscous lidocaine which she has not purchased.  Tells me the throat is gotten better but today she complains of a sore tongue with a white coating and some red ulcers.  She denies fever, chills, lymphadenopathy, earaches, rash, cough, dizziness, or lightheadedness.  She tells me her blood pressure at the urgent care center was 138/90.  Recently ran out of Bystolic and needs a refill.    Observations/Objective: On the video feed the top of her tongue has a white coating and distally there appear to be a patch of superficial ulcers that appear to be aphthous ulcers.  It was not red or swollen.  To the oral pharyngeal mucous membranes were within normal limits.  There was no trismus.  She was calm, cooperative, and in no acute distress.   Assessment and Plan: She is recovering from viral pharyngitis but now has candidiasis on her tongue and aphthous ulcers.  I have asked her to use a 10-day course of Mycelex Troches to treat the Candida infection and I have asked her to swish and swallow with prednisolone liquid to reduce the discomfort from the aphthous ulcers.  Her blood pressure is not adequately well controlled due to noncompliance.  I have asked her to restart Bystolic.   Follow Up Instructions: She will let me know if she develops any new or worsening  symptoms.  I sent in a refill for Bystolic.  She agrees to be compliant with the recommended medications.    I discussed the assessment and treatment plan with the patient. The patient was provided an opportunity to ask questions and all were answered. The patient agreed with the plan and demonstrated an understanding of the instructions.   The patient was advised to call back or seek an in-person evaluation if the symptoms worsen or if the condition fails to improve as anticipated.  I provided 25 minutes of non-face-to-face time during this encounter.   Scarlette Calico, MD

## 2018-11-17 ENCOUNTER — Encounter: Payer: Self-pay | Admitting: Internal Medicine

## 2018-11-17 ENCOUNTER — Other Ambulatory Visit: Payer: Self-pay

## 2018-11-17 ENCOUNTER — Encounter: Payer: Federal, State, Local not specified - PPO | Attending: General Surgery | Admitting: Skilled Nursing Facility1

## 2018-11-17 DIAGNOSIS — E669 Obesity, unspecified: Secondary | ICD-10-CM

## 2018-11-17 NOTE — Progress Notes (Addendum)
Follow-up visit: Post-Operative RYGB Surgery  Primary concerns today: Post-operative Bariatric Surgery Nutrition Management.  I connected with Mary Sanford on telephone at 10  AM EDT by telephone at home and verified that I am speaking with the correct person using two identifiers.   I discussed the limitations, risks, security and privacy concerns of performing an evaluation and management service by telephone and the availability of in person appointments. I also discussed with the patient that there may be a patient responsible charge related to this service. The patient expressed understanding and agreed to proceed.  Pt states she cannot tolerate egg. Pt states she has been craving salads. Pt states she has had Champagne and wine: Dietitian cautioned pt against this behavior.   Pt states she is struggling to get in her fluids still. Pt states she has finally figured out she cannot drink with meals. Pt states water bothers her: as soon as it hits stomach it is painful so surgeon prescribed her an acid reducer; tried different temperatures when it is cool it does not hurt as much. This pain was usually when she has eaten. Pt did not realize this until dietitian brought that to her attention so now she is going to pay closer attention. Pt states she feels she really needs to drink directly after eating so this will be a struggle. Pt states she does not want her husband to feel bored with her so she drinks wine pt clarified she just wants to do what she finds fun. Pt states she feels dehydration is generally an issue for her. Pt states she does not have an appetite. Pt states she has thrush. Pt states she loves seafood.   Surgery date: 06/22/2018 Surgery type: RYGB Start weight at [facility]: 254.8 Weight today: phone appt Weight change:   24-hr recall: 4 days a week skipping lunch; snacking on fruit or pretzels on these days B (4-4:03KV): 2 slices of bacon 1-2 scrambled eggs with 1 piece of toast Snk  (AM):  Fruit or pretzels L (3 PM): chicken or seafood or lentils with chicken  Snk (PM):  Dried edamame D (PM): steamed edamame  Snk (PM): sugar free jello   Fluid intake: water, decaf coffee, hot tea, red wine, unsweet tea, soda, water with flavoring Estimated total protein intake: 60+  Medications: see list Supplementation: 2 celebrate (only taking 1 sometimes with low funds) and citrate calcium   Using straws: no Drinking while eating: no Having you been chewing well:no Chewing/swallowing difficulties: no Changes in vision: no Changes to mood/headaches: no Hair loss/Cahnges to skin/Changes to nails: no Any difficulty focusing or concentrating: no Sweating: no Dizziness/Lightheaded: no Palpitations: no  Carbonated beverages: no N/V/D/C/GAS: no Abdominal Pain: no Dumping syndrome: no  Recent physical activity:  Walking 30 minutes 5 days a week; planks and crunches  Progress Towards Goal(s):  In progress.    Intervention:  Nutrition counseling. Pts diet was advanced to the next phase now including non starchy veggies Due to the bodies need for essential vitamins, minerals, and fats the pt was educated on the need to consume a certain amount of calories as well as certain nutrients daily. Pt was educated on the need for daily physical activity and to reach a goal of at least 150 minutes of moderate to vigorous physical activity as directed by their physician due to such benefits as increased musculature and improved lab values. Goals: -Set an eating and drinking schedule so you do not drink within the window of eating -Work  on intent and consistency  -Get in the minimum of 64 fluid ounces a day -Do not take your multi and calcium within 2 hours or the calcium within 2 hours -Try alkaline water pH 8.8  -Awareness, Consistency, Structure  -Stick to once a day for fruit and starchy vegetables: limit yourself to 1/2 cup or less  Teaching Method Utilized:   Visual Auditory Hands on  Barriers to learning/adherence to lifestyle change: none identified   Demonstrated degree of understanding via:  Teach Back   Monitoring/Evaluation:  Dietary intake, exercise, and body weight. Follow up in 3 months

## 2019-01-18 ENCOUNTER — Other Ambulatory Visit: Payer: Self-pay | Admitting: Internal Medicine

## 2019-01-18 DIAGNOSIS — I10 Essential (primary) hypertension: Secondary | ICD-10-CM

## 2019-02-02 ENCOUNTER — Other Ambulatory Visit: Payer: Self-pay

## 2019-02-02 ENCOUNTER — Encounter: Payer: Federal, State, Local not specified - PPO | Attending: General Surgery | Admitting: Skilled Nursing Facility1

## 2019-02-02 DIAGNOSIS — E669 Obesity, unspecified: Secondary | ICD-10-CM | POA: Insufficient documentation

## 2019-02-02 NOTE — Patient Instructions (Addendum)
-  have non starchy vegetables 2 times a day 7 days a week   -Do not add butter to your boil or vegetables   -How many times are you putting food in your mouth throughout the day?   -Try alkaline water; try hot water, and try hot tea (with NO splenda): reaching for 64 ounces a day  -Get that DVD set up for walking: TODAY

## 2019-02-02 NOTE — Progress Notes (Signed)
Follow-up visit: Post-Operative RYGB Surgery  Primary concerns today: Post-operative Bariatric Surgery Nutrition Management.  I connected with Mary Sanford on telephone at 10 AM EDT by telephone at home and verified that I am speaking with the correct person using two identifiers.   I discussed the limitations, risks, security and privacy concerns of performing an evaluation and management service by telephone and the availability of in person appointments. I also discussed with the patient that there may be a patient responsible charge related to this service. The patient expressed understanding and agreed to proceed.   Pt states she has been overwhelmed with personal stress lately really keeping her off track and not doing right. Pt was mixed up and not following the recommendations stating it is all too much. Pt states she even has an alarm which has not worked for her. Pt states she loves seafood mostly crab as  Apart of a seafood boil. Pt sates she does not take an acid reducer this was prescribed by her surgeon.  Pt states she really wants to do right but struggles with actually doing what she needs to do due to lack of motivation.   Surgery date: 06/22/2018 Surgery type: RYGB Start weight at [facility]: 254.8 Weight today: phone appt Weight change:   24-hr recall: 4 days a week skipping lunch; snacking on fruit or pretzels on these days B (8-9:30AM): 2 cups of coffee with half caffiene and oatmeal  Snk (AM):  Fruit or pretzels L (3 PM): chicken or seafood or lentils with chicken or corn and crab Snk (PM):  Dried edamame D (PM): usually seafood boil with corn and potato sometimes rice Snk (PM): sugar free jello   Fluid intake: decaf coffee, hot tea, 1-2 glasses red wine 4 times a week, unsweet tea, soda, water with flavoring, diet gingerale: Maybe 40oz? Estimated total protein intake: 60+  Medications: see list Supplementation: capsule bari adv 3 and citrate calcium   Using straws:  no Drinking while eating: no Having you been chewing well:no Chewing/swallowing difficulties: no Changes in vision: no Changes to mood/headaches: no Hair loss/Cahnges to skin/Changes to nails: no Any difficulty focusing or concentrating: no Sweating: no Dizziness/Lightheaded: no Palpitations: no  Carbonated beverages: no N/V/D/C/GAS: no Abdominal Pain: no Dumping syndrome: no  Recent physical activity:  ADL's  Progress Towards Goal(s):  In progress.    Intervention:  Nutrition counseling. Pts diet was advanced to the next phase now including non starchy veggies Due to the bodies need for essential vitamins, minerals, and fats the pt was educated on the need to consume a certain amount of calories as well as certain nutrients daily. Pt was educated on the need for daily physical activity and to reach a goal of at least 150 minutes of moderate to vigorous physical activity as directed by their physician due to such benefits as increased musculature and improved lab values. Goals: -have non starchy vegetables 2 times a day 7 days a week  -Do not add butter to your boil or vegetables  -How many times are you putting food in your mouth throughout the day? -Try alkaline water; try hot water, and try hot tea (with NO splenda): reaching for 64 ounces a day -Get that DVD set up for walking: TODAY  Teaching Method Utilized:  Visual Auditory Hands on  Barriers to learning/adherence to lifestyle change: none identified   Demonstrated degree of understanding via:  Teach Back   Monitoring/Evaluation:  Dietary intake, exercise, and body weight. Follow up in 2  months

## 2019-02-20 ENCOUNTER — Encounter (HOSPITAL_COMMUNITY): Payer: Self-pay

## 2019-02-20 ENCOUNTER — Emergency Department (HOSPITAL_COMMUNITY): Payer: Federal, State, Local not specified - PPO

## 2019-02-20 ENCOUNTER — Emergency Department (HOSPITAL_COMMUNITY)
Admission: EM | Admit: 2019-02-20 | Discharge: 2019-02-20 | Disposition: A | Discharge status: Home / Self Care | Payer: Federal, State, Local not specified - PPO | Attending: Emergency Medicine | Admitting: Emergency Medicine

## 2019-02-20 ENCOUNTER — Other Ambulatory Visit: Payer: Self-pay

## 2019-02-20 DIAGNOSIS — R05 Cough: Secondary | ICD-10-CM | POA: Diagnosis not present

## 2019-02-20 DIAGNOSIS — N3000 Acute cystitis without hematuria: Secondary | ICD-10-CM | POA: Diagnosis not present

## 2019-02-20 DIAGNOSIS — Z87891 Personal history of nicotine dependence: Secondary | ICD-10-CM | POA: Diagnosis not present

## 2019-02-20 DIAGNOSIS — Z20828 Contact with and (suspected) exposure to other viral communicable diseases: Secondary | ICD-10-CM | POA: Diagnosis not present

## 2019-02-20 DIAGNOSIS — Z9884 Bariatric surgery status: Secondary | ICD-10-CM | POA: Diagnosis not present

## 2019-02-20 DIAGNOSIS — R059 Cough, unspecified: Secondary | ICD-10-CM

## 2019-02-20 DIAGNOSIS — I10 Essential (primary) hypertension: Secondary | ICD-10-CM | POA: Insufficient documentation

## 2019-02-20 DIAGNOSIS — Z79899 Other long term (current) drug therapy: Secondary | ICD-10-CM | POA: Diagnosis not present

## 2019-02-20 LAB — BASIC METABOLIC PANEL
Anion gap: 11 (ref 5–15)
BUN: 14 mg/dL (ref 6–20)
CO2: 22 mmol/L (ref 22–32)
Calcium: 9.1 mg/dL (ref 8.9–10.3)
Chloride: 104 mmol/L (ref 98–111)
Creatinine, Ser: 0.77 mg/dL (ref 0.44–1.00)
GFR calc Af Amer: >60 mL/min (ref >60)
GFR calc non Af Amer: >60 mL/min (ref >60)
Glucose, Bld: 119 mg/dL — ABNORMAL HIGH (ref 70–99)
Potassium: 3.6 mmol/L (ref 3.5–5.1)
Sodium: 137 mmol/L (ref 135–145)

## 2019-02-20 LAB — CBC WITH DIFFERENTIAL/PLATELET
Abs Immature Granulocytes: 0.03 10*3/uL (ref 0.00–0.07)
Basophils Absolute: 0.1 10*3/uL (ref 0.0–0.1)
Basophils Relative: 1 %
Eosinophils Absolute: 0 10*3/uL (ref 0.0–0.5)
Eosinophils Relative: 0 %
HCT: 40 % (ref 36.0–46.0)
Hemoglobin: 12.8 g/dL (ref 12.0–15.0)
Immature Granulocytes: 0 %
Lymphocytes Relative: 15 %
Lymphs Abs: 1.5 10*3/uL (ref 0.7–4.0)
MCH: 24.3 pg — ABNORMAL LOW (ref 26.0–34.0)
MCHC: 32 g/dL (ref 30.0–36.0)
MCV: 76 fL — ABNORMAL LOW (ref 80.0–100.0)
Monocytes Absolute: 1.4 10*3/uL — ABNORMAL HIGH (ref 0.1–1.0)
Monocytes Relative: 14 %
Neutro Abs: 7 10*3/uL (ref 1.7–7.7)
Neutrophils Relative %: 70 %
Platelets: 214 10*3/uL (ref 150–400)
RBC: 5.26 MIL/uL — ABNORMAL HIGH (ref 3.87–5.11)
RDW: 13.8 % (ref 11.5–15.5)
WBC: 10 10*3/uL (ref 4.0–10.5)
nRBC: 0 % (ref 0.0–0.2)

## 2019-02-20 LAB — URINALYSIS, ROUTINE W REFLEX MICROSCOPIC
Bilirubin Urine: NEGATIVE
Glucose, UA: NEGATIVE mg/dL
Ketones, ur: NEGATIVE mg/dL
Nitrite: NEGATIVE
Protein, ur: 30 mg/dL — AB
Specific Gravity, Urine: 1.023 (ref 1.005–1.030)
WBC, UA: >50 WBC/hpf — ABNORMAL HIGH (ref 0–5)
pH: 7 (ref 5.0–8.0)

## 2019-02-20 LAB — SARS CORONAVIRUS 2 BY RT PCR (HOSPITAL ORDER, PERFORMED IN ~~LOC~~ HOSPITAL LAB): SARS Coronavirus 2: NEGATIVE

## 2019-02-20 MED ORDER — CEPHALEXIN 250 MG PO CAPS: 250.0000 mg | ORAL_CAPSULE | Freq: Once | ORAL | Status: DC

## 2019-02-20 MED ORDER — CEPHALEXIN 500 MG PO CAPS: 500.0000 mg | ORAL_CAPSULE | Freq: Four times a day (QID) | ORAL | 0 refills | Status: AC

## 2019-02-20 MED ORDER — CEPHALEXIN 500 MG PO CAPS
500.0000 mg | ORAL_CAPSULE | Freq: Once | ORAL | Status: AC
  Administered 2019-02-20: 500 mg via ORAL
  Filled 2019-02-20: qty 1

## 2019-02-20 MED ORDER — ACETAMINOPHEN 325 MG PO TABS
650.0000 mg | ORAL_TABLET | Freq: Once | ORAL | Status: AC | PRN
  Administered 2019-02-20: 650 mg via ORAL
  Filled 2019-02-20: qty 2

## 2019-02-20 MED ORDER — SODIUM CHLORIDE 0.9 % IV BOLUS
1000.0000 mL | Freq: Once | INTRAVENOUS | Status: AC
  Administered 2019-02-20: 1000 mL via INTRAVENOUS

## 2019-02-20 NOTE — Discharge Instructions (Addendum)

## 2019-02-20 NOTE — ED Provider Notes (Signed)
[facility] DEPT Provider Note   CSN: [id number] Arrival date & time: 02/20/19  1748    History   Chief Complaint Chief Complaint  Patient presents with  . URI    HPI Mary Sanford is a 53 y.o. female with past medical history of bariatric surgery, hypertension presents emergency department today with chief complaint of COVID-like symptoms x 1 week.  She reports having a nonproductive cough as well as generalized body ache, fatigue and fever.  T-max was 101.0 just prior to arrival and has chills.  She lost her sense of taste yesterday.  She has been taking Tylenol without symptom relief.  She states she has been around a family member that recently returned from [place].  She is very concerned about being positive because she has a pregnant daughter at home.   She denies chest pain, sore throat, sinus pressure, shortness of breath, abdominal pain,  Urinary symptoms, nausea, diarrhea.  History provided by patient with additional history obtained from chart review.     Past Medical History:  Diagnosis Date  . Allergy   . Anemia   . Anxiety   . Arthritis    arm/ wrist areas  . B12 deficiency    resolved  . Constipation    not chronic- now stools soft and regular   . Depression    no meds  . Family history of adverse reaction to anesthesia    one sister ponv and slow to awaken  . History of kidney stones   . Hypertension   . Urinary incontinence wears depends    Patient Active Problem List   Diagnosis Date Noted  . Cough 02/22/2019  . Fever and chills 02/22/2019  . Oral candida 11/16/2018  . Dehydration 10/08/2018  . Nausea 07/09/2018  . Chronic low back pain 06/22/2018  . Severe obesity ([place]) 06/22/2018  . ASCUS pap with negative high risk HPV on 06/30/16 06/30/2016  . Depression with anxiety 12/28/2015  . De Quervain's tenosynovitis, left 06/21/2015  . Ulcer aphthous oral 11/20/2014  . Prediabetes 11/02/2014  . Anemia, iron  deficiency 09/17/2013  . Routine general medical examination at a health care facility 01/12/2013  . Allergic rhinitis 01/12/2013  . Constipation, chronic 10/04/2012  . Visit for screening mammogram 01/12/2012  . B12 deficiency anemia 01/09/2012  . Obesity (BMI 30-39.9) 01/09/2012  . Essential hypertension, benign 11/24/2011    Past Surgical History:  Procedure Laterality Date  . BUNIONECTOMY Bilateral   . caesarean section     x 1  . cystoscopic     extraction of ureteric calculus without disintegration  . CYSTOSCOPY W/ RETROGRADES Left 06/28/2018   Procedure: CYSTOSCOPY WITH RETROGRADE PYELOGRAM LEFT STENT;  Surgeon: Ceasar Mons, MD;  Location: [facility];  Service: Urology;  Laterality: Left;  . CYSTOSCOPY/URETEROSCOPY/HOLMIUM LASER/STENT PLACEMENT Left 07/16/2018   Procedure: CYSTOSCOPY/URETEROSCOPY/HOLMIUM LASER/STENT EXCHANGE;  Surgeon: Ceasar Mons, MD;  Location: [facility];  Service: Urology;  Laterality: Left;  . DILITATION & CURRETTAGE/HYSTROSCOPY WITH NOVASURE ABLATION N/A 06/17/2013   Procedure: DILATATION & CURETTAGE/HYSTEROSCOPY WITH NOVASURE ABLATION;  Surgeon: Osborne Oman, MD;  Location: [facility];  Service: Gynecology;  Laterality: N/A;  . 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 HEMORRHOIDECTOMY WITH HEMORRHOID BANDING  10/19/2012   SCA [facility] Hem ligation/pexy  . TUBAL LIGATION    . WISDOM TOOTH  EXTRACTION       OB History    Gravida  7   Para  5   Term  4   Preterm  1   AB  2   Living  5     SAB  0   TAB  2   Ectopic  0   Multiple  0   Live Births  5            Home Medications    Prior to Admission medications   Medication Sig Start Date End Date Taking? Authorizing Provider  acetaminophen (TYLENOL) 500 MG tablet Take 1,000 mg by mouth every 6  (six) hours as needed for mild pain.     [provider]  albuterol (PROVENTIL HFA;VENTOLIN HFA) 108 (90 Base) MCG/ACT inhaler Inhale 1-2 puffs into the lungs every 6 (six) hours as needed for wheezing or shortness of breath. 10/24/16   Nche, Charlene Brooke, NP  calcium citrate (CALCITRATE - DOSED IN MG ELEMENTAL CALCIUM) 950 MG tablet Take 200 mg of elemental calcium by mouth daily.    [provider]  cephALEXin (KEFLEX) 500 MG capsule Take 1 capsule (500 mg total) by mouth 4 (four) times daily for 10 days. 02/20/19 03/02/19  Albrizze, Kaitlyn E, PA-C  escitalopram (LEXAPRO) 20 MG tablet Take 1 tablet (20 mg total) by mouth daily. 09/01/18   [name], MD  Multiple Vitamins-Minerals (MULTIVITAMIN WITH MINERALS) tablet Take 1 tablet by mouth daily. Bariatric chewable     [provider]  nebivolol (BYSTOLIC) 5 MG tablet Take 1 tablet (5 mg total) by mouth daily. 11/16/18   [name], MD  potassium chloride (K-DUR,KLOR-CON) 10 MEQ tablet Take 1 tablet (10 mEq total) by mouth 2 (two) times daily. Patient taking differently: Take 10 mEq by mouth daily.  07/23/17   [name], MD  prednisoLONE (PRELONE) 15 MG/5ML SOLN Take 5 mLs (15 mg total) by mouth 2 (two) times daily. Swish and spit for ulcers on tongue 11/16/18   [name], MD  sucralfate (CARAFATE) 1 g tablet  10/07/18   [provider]  triamterene-hydrochlorothiazide (MAXZIDE-25) 37.5-25 MG tablet Take 1 tablet by mouth daily 01/18/19   [name], MD  potassium chloride (K-DUR) 10 MEQ tablet Take 1 tablet (10 mEq total) by mouth 2 (two) times daily. 05/13/12 06/27/13  [name], MD    Family History Family History  Problem Relation Age of Onset  . Hypertension Mother   . Hypothyroidism Mother   . Asthma Mother   . Cancer Mother 76       Colon and rectal  . Rectal cancer Mother 35  . Colon cancer Mother 79  . Hypotension Sister   . Hypothyroidism Sister   . Diabetes Sister    . Seizures Brother   . Hypothyroidism Sister   . Seizures Sister   . Anemia Sister   . Asthma Other   . Hypothyroidism Other   . Heart disease Paternal Grandfather        >55  . Stroke Maternal Uncle        > 55  . Deep vein thrombosis Sister   . Early death Neg Hx   . Hyperlipidemia Neg Hx   . Kidney disease Neg Hx   . Colon polyps Neg Hx   . Esophageal cancer Neg Hx   . Stomach cancer Neg Hx     Social History Social History   Tobacco Use  . Smoking  status: Former Smoker    Years: 5.00    Types: Cigarettes    Quit date: 08/05/1999    Years since quitting: 19.5  . Smokeless tobacco: Never Used  . Tobacco comment: 2-5 cigarettes per day  Substance Use Topics  . Alcohol use: Not Currently  . Drug use: No     Allergies   Naproxen   Review of Systems Review of Systems  Constitutional: Positive for chills, fatigue and fever.  HENT: Negative for congestion, ear discharge, ear pain, sinus pressure, sinus pain and sore throat.   Eyes: Negative for pain and redness.  Respiratory: Positive for cough. Negative for shortness of breath.   Cardiovascular: Negative for chest pain.  Gastrointestinal: Negative for abdominal pain, constipation, diarrhea, nausea and vomiting.  Genitourinary: Negative for dysuria and hematuria.  Musculoskeletal: Negative for back pain and neck pain.  Skin: Negative for wound.  Neurological: Positive for weakness. Negative for numbness and headaches.     Physical Exam Updated Vital Signs BP (!) 146/87 (BP Location: Right Arm)   Pulse 98   Temp (!) 100.7 F (38.2 C) (Oral)   Resp 20   LMP 02/07/2019 (Exact Date)   SpO2 99%   Physical Exam Vitals signs and nursing note reviewed.  Constitutional:      General: She is not in acute distress.    Appearance: She is not ill-appearing.  HENT:     Head: Normocephalic and atraumatic.     Right Ear: Tympanic membrane and external ear normal.     Left Ear: Tympanic membrane and external ear  normal.     Nose: Nose normal.     Mouth/Throat:     Mouth: Mucous membranes are moist.     Pharynx: Oropharynx is clear.  Eyes:     General: No scleral icterus.       Right eye: No discharge.        Left eye: No discharge.     Extraocular Movements: Extraocular movements intact.     Conjunctiva/sclera: Conjunctivae normal.     Pupils: Pupils are equal, round, and reactive to light.  Neck:     Musculoskeletal: Normal range of motion.     Vascular: No JVD.  Cardiovascular:     Rate and Rhythm: Normal rate and regular rhythm.     Pulses: Normal pulses.          Radial pulses are 2+ on the right side and 2+ on the left side.     Heart sounds: Normal heart sounds.  Pulmonary:     Comments: Lungs clear to auscultation in all fields. Symmetric chest rise. No wheezing, rales, or rhonchi. SpO2 is 99% on room air. Abdominal:     Tenderness: There is right CVA tenderness and left CVA tenderness.     Comments: Abdomen is soft, non-distended, and non-tender in all quadrants. No rigidity, no guarding. No peritoneal signs.  Musculoskeletal: Normal range of motion.  Skin:    General: Skin is warm and dry.     Capillary Refill: Capillary refill takes less than 2 seconds.     Findings: No rash.  Neurological:     Mental Status: She is oriented to person, place, and time.     GCS: GCS eye subscore is 4. GCS verbal subscore is 5. GCS motor subscore is 6.     Comments: Fluent speech, no facial droop.  Psychiatric:        Behavior: Behavior normal.      ED Treatments / Results  Labs (all labs ordered are listed, but only abnormal results are displayed) Labs Reviewed        Result Value             All other components within normal limits  BASIC METABOLIC PANEL - Abnormal; Notable for the following components:   Glucose, Bld 119 (*)    All other components within normal limits  CBC WITH DIFFERENTIAL/PLATELET - Abnormal; Notable for the following components:   RBC 5.26 (*)    MCV 76.0  (*)    MCH 24.3 (*)    Monocytes Absolute 1.4 (*)    All other components within normal limits  URINALYSIS, ROUTINE W REFLEX MICROSCOPIC - Abnormal; Notable for the following components:   Hgb urine dipstick SMALL (*)    Protein, ur 30 (*)    Leukocytes,Ua SMALL (*)    WBC, UA >50 (*)    Bacteria, UA MANY (*)    All other components within normal limits  SARS CORONAVIRUS 2 (HOSPITAL ORDER, [facility])    EKG None  Radiology Dg Chest 2 View  Result Date: 02/22/2019 CLINICAL DATA:  Cough and fever EXAM: CHEST - 2 VIEW COMPARISON:  02/20/2019 FINDINGS: The heart size and mediastinal contours are within normal limits. Both lungs are clear. The visualized skeletal structures are unremarkable. IMPRESSION: No acute abnormality of the lungs.  No airspace opacity. Electronically Signed   By: Eddie Candle M.D.   On: 02/22/2019 11:52   Dg Chest Portable 1 View  Result Date: 02/20/2019 CLINICAL DATA:  Cough for 1 week. Question COVID-19. EXAM: PORTABLE CHEST 1 VIEW COMPARISON:  Radiograph 06/27/2018 FINDINGS: The cardiomediastinal contours are normal. The lungs are clear. Pulmonary vasculature is normal. No consolidation, pleural effusion, or pneumothorax. No acute osseous abnormalities are seen. IMPRESSION: Negative AP view of the chest. Electronically Signed   By: Keith Rake M.D.   On: 02/20/2019 19:42    Procedures Procedures (including critical care time)  Medications Ordered in ED Medications  acetaminophen (TYLENOL) tablet 650 mg (650 mg Oral Given 02/20/19 1817)  sodium chloride 0.9 % bolus 1,000 mL (0 mLs Intravenous Stopped 02/20/19 2200)  cephALEXin (KEFLEX) capsule 500 mg (500 mg Oral Given 02/20/19 2223)     Initial Impression / Assessment and Plan / ED Course  I have reviewed the triage vital signs and the nursing notes.  Pertinent labs & imaging results that were available during my care of the patient were reviewed by me and considered in my  medical decision making (see chart for details).  54 year old female presents with Symptoms concerning for COVID. On exam she is nontoxic appearing, in no acute distress.  She is febrile at 100.7 and tachycardic to 102. She is not hypoxic.  Lungs are clear to auscultation in all fields, abdomen is nontender. She has bilateral CVA tenderness and history of chronic back pain making it difficult to distinguish between the two.  Mucus membranes are dry.  Will check basic labs, chest xray, test for covid, UA, IVF. Tylenol for fever in triage.  UA shows signs of infection with small leukocytes, <50 WBC, and many bacteria. Culture sent. CBC and BMP unremarkable. Covid test is negative. Chest xray viewed by me is without infiltrate. She is tolerating PO and tachycardia has improved with IV fluids. Given concern for pyelonephritis, will discharge home with prescription for Keflex x 10 days to cover for UTI also.  Patient is hemodynamically stable, in NAD, and able to ambulate in  the ED. Evaluation does not show pathology that would require ongoing emergent intervention or inpatient treatment. I explained the diagnosis to the patient.   Patient is comfortable with above plan and is stable for discharge at this time. All questions were answered prior to disposition.  Strict return precautions for returning to the ED were discussed. Encouraged close  follow up with PCP.  Findings and plan of care discussed with supervising physician Dr. Mena Goes was evaluated in Emergency Department on 02/22/2019 for the symptoms described in the history of present illness. She was evaluated in the context of the global COVID-19 pandemic, which necessitated consideration that the patient might be at risk for infection with the SARS-CoV-2 virus that causes COVID-19. Institutional protocols and algorithms that pertain to the evaluation of patients at risk for COVID-19 are in a state of rapid change based on information  released by regulatory bodies including the CDC and federal and state organizations. These policies and algorithms were followed during the patient's care in the ED.   Final Clinical Impressions(s) / ED Diagnoses   Final diagnoses:  Acute cystitis without hematuria  Cough    ED Discharge Orders         Ordered    cephALEXin (KEFLEX) 500 MG capsule  4 times daily     02/20/19 2213           Flint Melter 02/22/19 1203    Isla Pence, MD 02/22/19 1418

## 2019-02-20 NOTE — ED Triage Notes (Signed)
She reports hacky cough x 1 week. She also c/o , for past couple of days: body aches and h/a, along with persistent cough. She is ambulatory and in no distress.

## 2019-02-20 NOTE — ED Notes (Signed)
Lab contacted to add culture to urine sample.

## 2019-02-21 ENCOUNTER — Other Ambulatory Visit: Payer: Self-pay

## 2019-02-21 ENCOUNTER — Telehealth: Payer: Self-pay

## 2019-02-21 ENCOUNTER — Encounter (HOSPITAL_COMMUNITY): Payer: Self-pay

## 2019-02-21 DIAGNOSIS — Z5321 Procedure and treatment not carried out due to patient leaving prior to being seen by health care provider: Secondary | ICD-10-CM | POA: Insufficient documentation

## 2019-02-21 DIAGNOSIS — M7918 Myalgia, other site: Secondary | ICD-10-CM | POA: Diagnosis not present

## 2019-02-21 DIAGNOSIS — R509 Fever, unspecified: Secondary | ICD-10-CM | POA: Diagnosis not present

## 2019-02-21 NOTE — Telephone Encounter (Signed)
Appointment has been made for tomorrow 7/21

## 2019-02-21 NOTE — Telephone Encounter (Signed)
Copied from [place] [phone]. Topic: General - Other >> Feb 21, 2019 12:09 PM Leward Quan A wrote: Reason for CRM: Patient called in to get an appointment with Dr Ronnald Ramp, called spoke to [place] who will speak to Dr Ronnald Ramp and see what to do. Patient was seen at the ED on 02/20/2019 has all symptoms of covid but tested negative and have a UTI she would like a call back please. Can be reached at Ph# [phone] >> Feb 21, 2019 12:27 PM Para Skeans A wrote: Please advise if in office visit is okay?

## 2019-02-21 NOTE — Telephone Encounter (Signed)
Per PCP, she can come in.

## 2019-02-21 NOTE — ED Triage Notes (Addendum)
Pt returns with the same complaint as yesterday- COVID symptoms. Pt was tested yesterday and was negative. Pt states she is still not feeling well.  Tylenol at 1730.

## 2019-02-22 ENCOUNTER — Ambulatory Visit (INDEPENDENT_AMBULATORY_CARE_PROVIDER_SITE_OTHER): Payer: Federal, State, Local not specified - PPO | Admitting: Internal Medicine

## 2019-02-22 ENCOUNTER — Encounter: Payer: Self-pay | Admitting: Internal Medicine

## 2019-02-22 ENCOUNTER — Emergency Department (HOSPITAL_COMMUNITY)
Admission: EM | Admit: 2019-02-22 | Discharge: 2019-02-22 | Discharge status: Against Medical Advice | Payer: Federal, State, Local not specified - PPO | Attending: Emergency Medicine | Admitting: Emergency Medicine

## 2019-02-22 ENCOUNTER — Other Ambulatory Visit: Payer: Self-pay

## 2019-02-22 ENCOUNTER — Ambulatory Visit (INDEPENDENT_AMBULATORY_CARE_PROVIDER_SITE_OTHER)
Admission: RE | Admit: 2019-02-22 | Discharge: 2019-02-22 | Disposition: A | Discharge status: Home / Self Care | Payer: Federal, State, Local not specified - PPO | Source: Ambulatory Visit | Attending: Internal Medicine | Admitting: Internal Medicine

## 2019-02-22 VITALS — BP 138/70 | HR 80 | Temp 98.7°F | Ht 69.0 in | Wt 202.0 lb

## 2019-02-22 DIAGNOSIS — J069 Acute upper respiratory infection, unspecified: Secondary | ICD-10-CM | POA: Diagnosis not present

## 2019-02-22 DIAGNOSIS — R059 Cough, unspecified: Secondary | ICD-10-CM

## 2019-02-22 DIAGNOSIS — R509 Fever, unspecified: Secondary | ICD-10-CM

## 2019-02-22 DIAGNOSIS — R05 Cough: Secondary | ICD-10-CM | POA: Diagnosis not present

## 2019-02-22 DIAGNOSIS — B9789 Other viral agents as the cause of diseases classified elsewhere: Secondary | ICD-10-CM | POA: Diagnosis not present

## 2019-02-22 LAB — POC INFLUENZA A&B (BINAX/QUICKVUE)
Influenza A, POC: NEGATIVE
Influenza B, POC: NEGATIVE

## 2019-02-22 LAB — URINE CULTURE: Culture: 80000 — AB

## 2019-02-22 LAB — POCT EXHALED NITRIC OXIDE: FeNO level (ppb): 7

## 2019-02-22 MED ORDER — HYDROCODONE-HOMATROPINE 5-1.5 MG/5ML PO SYRP: 5.0000 mL | ORAL_SOLUTION | Freq: Three times a day (TID) | ORAL | 0 refills | Status: DC | PRN

## 2019-02-22 NOTE — Progress Notes (Signed)
Subjective:  Patient ID: Mary Sanford, female    DOB: 1966/04/06  Age: 53 y.o. MRN: [id number]  CC: Cough   HPI Mary Sanford presents for concerns about a one-week history of cough.  She has had a nonproductive cough for 1 week and now has had low-grade fever and chills for 3 days.  The fever resolves after a dose of ibuprofen.  She also complains of sore throat, muscle aches, and fatigue.  She was recently seen in the ED and had a negative COVID-19 antigen test and a normal portable chest x-ray.  Cephalexin was prescribed.  Outpatient Medications Prior to Visit  Medication Sig Dispense Refill  . acetaminophen (TYLENOL) 500 MG tablet Take 1,000 mg by mouth every 6 (six) hours as needed for mild pain.     Marland Kitchen albuterol (PROVENTIL HFA;VENTOLIN HFA) 108 (90 Base) MCG/ACT inhaler Inhale 1-2 puffs into the lungs every 6 (six) hours as needed for wheezing or shortness of breath. 1 Inhaler 0  . calcium citrate (CALCITRATE - DOSED IN MG ELEMENTAL CALCIUM) 950 MG tablet Take 200 mg of elemental calcium by mouth daily.    . cephALEXin (KEFLEX) 500 MG capsule Take 1 capsule (500 mg total) by mouth 4 (four) times daily for 10 days. 40 capsule 0  . escitalopram (LEXAPRO) 20 MG tablet Take 1 tablet (20 mg total) by mouth daily. 90 tablet 1  . Multiple Vitamins-Minerals (MULTIVITAMIN WITH MINERALS) tablet Take 1 tablet by mouth daily. Bariatric chewable     . nebivolol (BYSTOLIC) 5 MG tablet Take 1 tablet (5 mg total) by mouth daily. 90 tablet 1  . potassium chloride (K-DUR,KLOR-CON) 10 MEQ tablet Take 1 tablet (10 mEq total) by mouth 2 (two) times daily. (Patient taking differently: Take 10 mEq by mouth daily. ) 180 tablet 1  . prednisoLONE (PRELONE) 15 MG/5ML SOLN Take 5 mLs (15 mg total) by mouth 2 (two) times daily. Swish and spit for ulcers on tongue 450 mL 0  . sucralfate (CARAFATE) 1 g tablet     . triamterene-hydrochlorothiazide (MAXZIDE-25) 37.5-25 MG tablet Take 1 tablet by mouth daily 90  tablet 0   No facility-administered medications prior to visit.     ROS Review of Systems  Constitutional: Positive for chills, fatigue and fever. Negative for diaphoresis and unexpected weight change.  HENT: Positive for sore throat. Negative for sinus pressure and trouble swallowing.   Eyes: Negative.   Respiratory: Positive for cough. Negative for chest tightness, shortness of breath and wheezing.   Cardiovascular: Negative for chest pain, palpitations and leg swelling.  Gastrointestinal: Negative for abdominal pain, constipation, diarrhea, nausea and vomiting.  Endocrine: Negative.   Genitourinary: Negative.  Negative for difficulty urinating, dysuria and hematuria.  Musculoskeletal: Positive for myalgias. Negative for arthralgias, back pain and neck pain.  Skin: Negative for color change and rash.  Neurological: Negative.  Negative for dizziness, light-headedness and headaches.  Hematological: Negative for adenopathy. Does not bruise/bleed easily.  Psychiatric/Behavioral: Negative.     Objective:  BP 138/70 (BP Location: Left Arm, Patient Position: Sitting, Cuff Size: Large)   Pulse 80   Temp 98.7 F (37.1 C) (Oral)   Ht 5\' 9"  (1.753 m)   Wt 202 lb (91.6 kg)   LMP 02/12/2019   SpO2 99%   BMI 29.83 kg/m   BP Readings from Last 3 Encounters:  02/22/19 138/70  02/21/19 96/69  02/20/19 (!) 146/87    Wt Readings from Last 3 Encounters:  02/22/19 202 lb (91.6 kg)  02/21/19 218 lb 14.7 oz (99.3 kg)  09/01/18 219 lb (99.3 kg)    Physical Exam Vitals signs reviewed.  Constitutional:      General: She is not in acute distress.    Appearance: She is not ill-appearing, toxic-appearing or diaphoretic.  HENT:     Nose: Nose normal. No congestion or rhinorrhea.     Mouth/Throat:     Mouth: Mucous membranes are moist.     Pharynx: No oropharyngeal exudate or posterior oropharyngeal erythema.  Eyes:     General: No scleral icterus.    Conjunctiva/sclera: Conjunctivae  normal.  Neck:     Musculoskeletal: Normal range of motion. No neck rigidity.  Cardiovascular:     Rate and Rhythm: Normal rate and regular rhythm.     Heart sounds: No murmur.  Pulmonary:     Effort: Pulmonary effort is normal.     Breath sounds: No stridor. No wheezing, rhonchi or rales.  Abdominal:     General: Abdomen is protuberant. Bowel sounds are normal.     Palpations: There is no hepatomegaly or splenomegaly.     Tenderness: There is no abdominal tenderness.  Musculoskeletal: Normal range of motion.  Lymphadenopathy:     Cervical: No cervical adenopathy.  Skin:    General: Skin is warm and dry.     Coloration: Skin is not pale.  Neurological:     General: No focal deficit present.     Mental Status: She is alert.     Lab Results  Component Value Date   WBC 10.0 02/20/2019   HGB 12.8 02/20/2019   HCT 40.0 02/20/2019   PLT 214 02/20/2019   GLUCOSE 119 (H) 02/20/2019   CHOL 135 04/01/2017   TRIG 93.0 04/01/2017   HDL 38.30 (L) 04/01/2017   LDLCALC 78 04/01/2017   ALT 69 (H) 06/28/2018   AST 33 06/28/2018   NA 137 02/20/2019   K 3.6 02/20/2019   CL 104 02/20/2019   CREATININE 0.77 02/20/2019   BUN 14 02/20/2019   CO2 22 02/20/2019   TSH 1.21 04/01/2017   HGBA1C 5.5 09/01/2018    Dg Chest 2 View  Result Date: 02/22/2019 CLINICAL DATA:  Cough and fever EXAM: CHEST - 2 VIEW COMPARISON:  02/20/2019 FINDINGS: The heart size and mediastinal contours are within normal limits. Both lungs are clear. The visualized skeletal structures are unremarkable. IMPRESSION: No acute abnormality of the lungs.  No airspace opacity. Electronically Signed   By: Eddie Candle M.D.   On: 02/22/2019 11:52    Assessment & Plan:   [name] was seen today for cough.  Diagnoses and all orders for this visit:  Cough- Her FeNO test is low so I do not think she has an allergic pulmonary condition that needs to be treated with steroids.  Testing for influenza a and B is negative.  She  recently had a negative COVID-19 antigen test.  Her chest x-ray PA and lateral is normal.  I will treat her for viral URI. -     DG Chest 2 View; Future -     POCT EXHALED NITRIC OXIDE -     POC Influenza A&B (Binax test)  Fever and chills -     DG Chest 2 View; Future -     POC Influenza A&B (Binax test)  Viral URI with cough -     HYDROcodone-homatropine (HYCODAN) 5-1.5 MG/5ML syrup; Take 5 mLs by mouth every 8 (eight) hours as needed for cough.   I am  having [name] start on HYDROcodone-homatropine. I am also having her maintain her albuterol, acetaminophen, potassium chloride, multivitamin with minerals, calcium citrate, escitalopram, nebivolol, prednisoLONE, triamterene-hydrochlorothiazide, cephALEXin, and sucralfate.  Meds ordered this encounter  Medications  . HYDROcodone-homatropine (HYCODAN) 5-1.5 MG/5ML syrup    Sig: Take 5 mLs by mouth every 8 (eight) hours as needed for cough.    Dispense:  120 mL    Refill:  0     Follow-up: No follow-ups on file.  Scarlette Calico, MD

## 2019-02-22 NOTE — Patient Instructions (Signed)

## 2019-02-23 ENCOUNTER — Encounter: Payer: Self-pay | Admitting: Internal Medicine

## 2019-02-23 ENCOUNTER — Telehealth: Payer: Self-pay

## 2019-02-23 NOTE — Telephone Encounter (Signed)
Post ED Visit - Positive Culture Follow-up  Culture report reviewed by antimicrobial stewardship pharmacist: [facility] Team []  Elenor Quinones, Pharm.D. []  Heide Guile, Pharm.D., BCPS AQ-ID []  Parks Neptune, Pharm.D., BCPS []  Alycia Rossetti, Pharm.D., BCPS []  [place], Pharm.D., BCPS, AAHIVP []  Legrand Como, Pharm.D., BCPS, AAHIVP []  Salome Arnt, PharmD, BCPS []  Johnnette Gourd, PharmD, BCPS []  Hughes Better, PharmD, BCPS []  Leeroy Cha, PharmD []  Laqueta Linden, PharmD, BCPS []  Albertina Parr, PharmD  [facility] Team []  Leodis Sias, PharmD []  Lindell Spar, PharmD []  Royetta Asal, PharmD []  Graylin Shiver, Rph []  Rema Fendt) Glennon Mac, PharmD []  Arlyn Dunning, PharmD []  Netta Cedars, PharmD [x]  Dia Sitter, PharmD []  Leone Haven, PharmD []  Gretta Arab, PharmD []  Theodis Shove, PharmD []  Peggyann Juba, PharmD []  Reuel Boom, PharmD   Positive urine culture Treated with Cephalexin, organism sensitive to the same and no further patient follow-up is required at this time.  Genia Del 02/23/2019, 9:37 AM

## 2019-05-26 ENCOUNTER — Other Ambulatory Visit: Payer: Self-pay | Admitting: Internal Medicine

## 2019-05-26 DIAGNOSIS — Z1231 Encounter for screening mammogram for malignant neoplasm of breast: Secondary | ICD-10-CM

## 2019-05-28 ENCOUNTER — Ambulatory Visit: Payer: Self-pay

## 2019-06-02 ENCOUNTER — Ambulatory Visit: Payer: Self-pay

## 2019-06-03 ENCOUNTER — Other Ambulatory Visit: Payer: Self-pay

## 2019-06-03 ENCOUNTER — Ambulatory Visit (INDEPENDENT_AMBULATORY_CARE_PROVIDER_SITE_OTHER): Payer: Federal, State, Local not specified - PPO

## 2019-06-03 DIAGNOSIS — Z23 Encounter for immunization: Secondary | ICD-10-CM

## 2019-06-06 ENCOUNTER — Encounter: Payer: Self-pay | Admitting: Internal Medicine

## 2019-06-16 ENCOUNTER — Other Ambulatory Visit (INDEPENDENT_AMBULATORY_CARE_PROVIDER_SITE_OTHER): Payer: Federal, State, Local not specified - PPO

## 2019-06-16 ENCOUNTER — Ambulatory Visit (INDEPENDENT_AMBULATORY_CARE_PROVIDER_SITE_OTHER): Payer: Federal, State, Local not specified - PPO | Admitting: Internal Medicine

## 2019-06-16 ENCOUNTER — Encounter: Payer: Self-pay | Admitting: Internal Medicine

## 2019-06-16 ENCOUNTER — Other Ambulatory Visit: Payer: Self-pay

## 2019-06-16 VITALS — BP 164/104 | HR 63 | Temp 98.3°F | Resp 16 | Ht 69.0 in | Wt 198.0 lb

## 2019-06-16 DIAGNOSIS — Z1231 Encounter for screening mammogram for malignant neoplasm of breast: Secondary | ICD-10-CM

## 2019-06-16 DIAGNOSIS — D510 Vitamin B12 deficiency anemia due to intrinsic factor deficiency: Secondary | ICD-10-CM

## 2019-06-16 DIAGNOSIS — R87610 Atypical squamous cells of undetermined significance on cytologic smear of cervix (ASC-US): Secondary | ICD-10-CM | POA: Diagnosis not present

## 2019-06-16 DIAGNOSIS — Z0000 Encounter for general adult medical examination without abnormal findings: Secondary | ICD-10-CM

## 2019-06-16 DIAGNOSIS — I10 Essential (primary) hypertension: Secondary | ICD-10-CM

## 2019-06-16 DIAGNOSIS — E559 Vitamin D deficiency, unspecified: Secondary | ICD-10-CM | POA: Insufficient documentation

## 2019-06-16 DIAGNOSIS — D508 Other iron deficiency anemias: Secondary | ICD-10-CM

## 2019-06-16 DIAGNOSIS — Z9884 Bariatric surgery status: Secondary | ICD-10-CM | POA: Insufficient documentation

## 2019-06-16 LAB — URINALYSIS, ROUTINE W REFLEX MICROSCOPIC
Bilirubin Urine: NEGATIVE
Hgb urine dipstick: NEGATIVE
Ketones, ur: NEGATIVE
Leukocytes,Ua: NEGATIVE
Nitrite: POSITIVE — AB
RBC / HPF: NONE SEEN (ref 0–?)
Specific Gravity, Urine: 1.02 (ref 1.000–1.030)
Total Protein, Urine: NEGATIVE
Urine Glucose: NEGATIVE
Urobilinogen, UA: 0.2 (ref 0.0–1.0)
pH: 6.5 (ref 5.0–8.0)

## 2019-06-16 LAB — LIPID PANEL
Cholesterol: 117 mg/dL (ref 0–200)
HDL: 57.4 mg/dL (ref >39.00)
LDL Cholesterol: 39 mg/dL (ref 0–99)
NonHDL: 59.73
Total CHOL/HDL Ratio: 2
Triglycerides: 106 mg/dL (ref 0.0–149.0)
VLDL: 21.2 mg/dL (ref 0.0–40.0)

## 2019-06-16 LAB — CBC WITH DIFFERENTIAL/PLATELET
Basophils Absolute: 0 10*3/uL (ref 0.0–0.1)
Basophils Relative: 0.3 % (ref 0.0–3.0)
Eosinophils Absolute: 0.2 10*3/uL (ref 0.0–0.7)
Eosinophils Relative: 3.1 % (ref 0.0–5.0)
HCT: 40.2 % (ref 36.0–46.0)
Hemoglobin: 12.8 g/dL (ref 12.0–15.0)
Lymphocytes Relative: 43 % (ref 12.0–46.0)
Lymphs Abs: 2.1 10*3/uL (ref 0.7–4.0)
MCHC: 32 g/dL (ref 30.0–36.0)
MCV: 74.4 fl — ABNORMAL LOW (ref 78.0–100.0)
Monocytes Absolute: 0.6 10*3/uL (ref 0.1–1.0)
Monocytes Relative: 12.6 % — ABNORMAL HIGH (ref 3.0–12.0)
Neutro Abs: 2 10*3/uL (ref 1.4–7.7)
Neutrophils Relative %: 41 % — ABNORMAL LOW (ref 43.0–77.0)
Platelets: 219 10*3/uL (ref 150.0–400.0)
RBC: 5.4 Mil/uL — ABNORMAL HIGH (ref 3.87–5.11)
RDW: 13.6 % (ref 11.5–15.5)
WBC: 4.9 10*3/uL (ref 4.0–10.5)

## 2019-06-16 LAB — HEPATIC FUNCTION PANEL
ALT: 25 U/L (ref 0–35)
AST: 19 U/L (ref 0–37)
Albumin: 4 g/dL (ref 3.5–5.2)
Alkaline Phosphatase: 67 U/L (ref 39–117)
Bilirubin, Direct: 0.1 mg/dL (ref 0.0–0.3)
Total Bilirubin: 0.5 mg/dL (ref 0.2–1.2)
Total Protein: 6.7 g/dL (ref 6.0–8.3)

## 2019-06-16 LAB — BASIC METABOLIC PANEL
BUN: 9 mg/dL (ref 6–23)
CO2: 29 mEq/L (ref 19–32)
Calcium: 9.6 mg/dL (ref 8.4–10.5)
Chloride: 105 mEq/L (ref 96–112)
Creatinine, Ser: 0.72 mg/dL (ref 0.40–1.20)
GFR: 102.88 mL/min (ref >60.00)
Glucose, Bld: 103 mg/dL — ABNORMAL HIGH (ref 70–99)
Potassium: 3.9 mEq/L (ref 3.5–5.1)
Sodium: 140 mEq/L (ref 135–145)

## 2019-06-16 LAB — IBC PANEL
Iron: 146 ug/dL — ABNORMAL HIGH (ref 42–145)
Saturation Ratios: 37.6 % (ref 20.0–50.0)
Transferrin: 277 mg/dL (ref 212.0–360.0)

## 2019-06-16 LAB — TSH: TSH: 1.18 u[IU]/mL (ref 0.35–4.50)

## 2019-06-16 LAB — FERRITIN: Ferritin: 113.6 ng/mL (ref 10.0–291.0)

## 2019-06-16 LAB — FOLATE: Folate: 15.2 ng/mL (ref >5.9)

## 2019-06-16 LAB — VITAMIN B12: Vitamin B-12: 410 pg/mL (ref 211–911)

## 2019-06-16 LAB — VITAMIN D 25 HYDROXY (VIT D DEFICIENCY, FRACTURES): VITD: 23.57 ng/mL — ABNORMAL LOW (ref 30.00–100.00)

## 2019-06-16 MED ORDER — NEBIVOLOL HCL 5 MG PO TABS: 5.0000 mg | ORAL_TABLET | Freq: Every day | ORAL | 0 refills | Status: DC

## 2019-06-16 MED ORDER — CHOLECALCIFEROL 50 MCG (2000 UT) PO TABS: 2.0000 | ORAL_TABLET | Freq: Every day | ORAL | 0 refills | Status: DC

## 2019-06-16 MED ORDER — TRIAMTERENE-HCTZ 37.5-25 MG PO TABS: 1.0000 | ORAL_TABLET | Freq: Every day | ORAL | 0 refills | Status: DC

## 2019-06-16 NOTE — Patient Instructions (Signed)

## 2019-06-16 NOTE — Progress Notes (Signed)
Subjective:  Patient ID: Mary Sanford, female    DOB: 10-24-1965  Age: 53 y.o. MRN: [id number]  CC: Annual Exam and Hypertension   HPI AVALIN DENZLER presents for a CPX.  According to prescription refills she would have run out of Maxide and Bystolic several months ago.  She has not been monitoring her blood pressure.  She denies headache, blurred vision, chest pain, shortness of breath, palpitations, or edema.  Outpatient Medications Prior to Visit  Medication Sig Dispense Refill  . albuterol (PROVENTIL HFA;VENTOLIN HFA) 108 (90 Base) MCG/ACT inhaler Inhale 1-2 puffs into the lungs every 6 (six) hours as needed for wheezing or shortness of breath. 1 Inhaler 0  . calcium citrate (CALCITRATE - DOSED IN MG ELEMENTAL CALCIUM) 950 MG tablet Take 200 mg of elemental calcium by mouth daily.    Marland Kitchen escitalopram (LEXAPRO) 20 MG tablet Take 1 tablet (20 mg total) by mouth daily. 90 tablet 1  . Multiple Vitamins-Minerals (MULTIVITAMIN WITH MINERALS) tablet Take 1 tablet by mouth daily. Bariatric chewable     . sucralfate (CARAFATE) 1 g tablet     . acetaminophen (TYLENOL) 500 MG tablet Take 1,000 mg by mouth every 6 (six) hours as needed for mild pain.     . nebivolol (BYSTOLIC) 5 MG tablet Take 1 tablet (5 mg total) by mouth daily. 90 tablet 1  . triamterene-hydrochlorothiazide (MAXZIDE-25) 37.5-25 MG tablet Take 1 tablet by mouth daily 90 tablet 0  . HYDROcodone-homatropine (HYCODAN) 5-1.5 MG/5ML syrup Take 5 mLs by mouth every 8 (eight) hours as needed for cough. 120 mL 0  . potassium chloride (K-DUR,KLOR-CON) 10 MEQ tablet Take 1 tablet (10 mEq total) by mouth 2 (two) times daily. (Patient taking differently: Take 10 mEq by mouth daily. ) 180 tablet 1  . prednisoLONE (PRELONE) 15 MG/5ML SOLN Take 5 mLs (15 mg total) by mouth 2 (two) times daily. Swish and spit for ulcers on tongue 450 mL 0   No facility-administered medications prior to visit.     ROS Review of Systems  Constitutional:  Negative for appetite change, diaphoresis, fatigue and unexpected weight change.  HENT: Negative.   Eyes: Negative for visual disturbance.  Respiratory: Negative for cough, chest tightness, shortness of breath and wheezing.   Cardiovascular: Negative for chest pain, palpitations and leg swelling.  Gastrointestinal: Negative for abdominal pain, blood in stool, constipation, diarrhea, nausea and vomiting.  Endocrine: Negative.   Genitourinary: Negative.  Negative for decreased urine volume, difficulty urinating, dysuria, urgency, vaginal bleeding and vaginal discharge.  Musculoskeletal: Positive for arthralgias and back pain. Negative for myalgias.       Chronic, unchanged, nonradiating low back pain.  Chronic, unchanged bilateral knee pain.  She gets adequate symptom relief with Tylenol.  Skin: Negative.  Negative for color change and pallor.  Neurological: Negative.  Negative for dizziness, weakness, light-headedness and headaches.  Hematological: Negative for adenopathy. Does not bruise/bleed easily.  Psychiatric/Behavioral: Negative.     Objective:  BP (!) 164/104 (BP Location: Left Arm, Patient Position: Sitting, Cuff Size: Large)   Pulse 63   Temp 98.3 F (36.8 C) (Oral)   Resp 16   Ht 5\' 9"  (1.753 m)   Wt 198 lb (89.8 kg)   SpO2 98%   BMI 29.24 kg/m   BP Readings from Last 3 Encounters:  06/16/19 (!) 164/104  02/22/19 138/70  02/21/19 96/69    Wt Readings from Last 3 Encounters:  06/16/19 198 lb (89.8 kg)  02/22/19 202 lb (91.6  kg)  02/21/19 218 lb 14.7 oz (99.3 kg)    Physical Exam Vitals signs reviewed.  Constitutional:      Appearance: Normal appearance.  HENT:     Nose: Nose normal. No rhinorrhea.     Mouth/Throat:     Mouth: Mucous membranes are moist.  Eyes:     General: No scleral icterus.    Conjunctiva/sclera: Conjunctivae normal.  Neck:     Musculoskeletal: Neck supple.  Cardiovascular:     Rate and Rhythm: Normal rate and regular rhythm.      Heart sounds: No murmur.  Pulmonary:     Effort: Pulmonary effort is normal.     Breath sounds: No stridor. No wheezing, rhonchi or rales.  Abdominal:     General: Abdomen is flat. Bowel sounds are normal. There is no distension.     Palpations: Abdomen is soft. There is no hepatomegaly, splenomegaly or mass.     Tenderness: There is no abdominal tenderness. There is no guarding.  Musculoskeletal: Normal range of motion.     Right lower leg: No edema.     Left lower leg: No edema.  Lymphadenopathy:     Cervical: No cervical adenopathy.  Skin:    General: Skin is warm and dry.  Neurological:     General: No focal deficit present.     Mental Status: She is alert.  Psychiatric:        Mood and Affect: Mood normal.        Behavior: Behavior normal.     Lab Results  Component Value Date   WBC 4.9 06/16/2019   HGB 12.8 06/16/2019   HCT 40.2 06/16/2019   PLT 219.0 06/16/2019   GLUCOSE 103 (H) 06/16/2019   CHOL 117 06/16/2019   TRIG 106.0 06/16/2019   HDL 57.40 06/16/2019   LDLCALC 39 06/16/2019   ALT 25 06/16/2019   AST 19 06/16/2019   NA 140 06/16/2019   K 3.9 06/16/2019   CL 105 06/16/2019   CREATININE 0.72 06/16/2019   BUN 9 06/16/2019   CO2 29 06/16/2019   TSH 1.18 06/16/2019   HGBA1C 5.5 09/01/2018    Dg Chest 2 View  Result Date: 02/22/2019 CLINICAL DATA:  Cough and fever EXAM: CHEST - 2 VIEW COMPARISON:  02/20/2019 FINDINGS: The heart size and mediastinal contours are within normal limits. Both lungs are clear. The visualized skeletal structures are unremarkable. IMPRESSION: No acute abnormality of the lungs.  No airspace opacity. Electronically Signed   By: Eddie Candle M.D.   On: 02/22/2019 11:52    Assessment & Plan:   Julanne was seen today for annual exam and hypertension.  Diagnoses and all orders for this visit:  Essential hypertension, benign- Her blood pressure is not adequately well controlled due to noncompliance.  I have asked her to restart  nebivolol, triamterene, and hydrochlorothiazide. -     nebivolol (BYSTOLIC) 5 MG tablet; Take 1 tablet (5 mg total) by mouth daily. -     triamterene-hydrochlorothiazide (MAXZIDE-25) 37.5-25 MG tablet; Take 1 tablet by mouth daily. -     Basic metabolic panel; Future -     TSH; Future -     Urinalysis, Routine w reflex microscopic; Future -     Vitamin D 25 hydroxy; Future  Vitamin B12 deficiency anemia due to intrinsic factor deficiency- She is doing well on monthly parenteral B12 replacement therapy. -     CBC with Differential; Future -     Folate; Future  Routine general  medical examination at a health care facility- Exam completed, labs reviewed, vaccines reviewed and updated, she agrees to see her GYN soon for a Pap, colon cancer screening is up-to-date, she is referred for screening mammogram, patient education was given. -     Lipid panel; Future -     B12; Future  Visit for screening mammogram  Other iron deficiency anemia- Her H&H and iron levels are normal now. -     CBC with Differential; Future -     IBC panel; Future -     Ferritin; Future  ASCUS pap with negative high risk HPV on 06/30/16 -     Ambulatory referral to Gynecology  Status post bariatric surgery- I will monitor her for vitamin deficiencies. -     Hepatic function panel; Future -     Vitamin D 25 hydroxy; Future -     Zinc; Future -     Vitamin B1; Future  Vitamin D insufficiency -     Cholecalciferol 50 MCG (2000 UT) TABS; Take 2 tablets (4,000 Units total) by mouth daily.   I have discontinued Eliseo Gum. Sherwin's acetaminophen, potassium chloride, prednisoLONE, and HYDROcodone-homatropine. I am also having her start on Cholecalciferol. Additionally, I am having her maintain her albuterol, multivitamin with minerals, calcium citrate, escitalopram, sucralfate, nebivolol, and triamterene-hydrochlorothiazide.  Meds ordered this encounter  Medications  . nebivolol (BYSTOLIC) 5 MG tablet    Sig: Take 1  tablet (5 mg total) by mouth daily.    Dispense:  90 tablet    Refill:  0  . triamterene-hydrochlorothiazide (MAXZIDE-25) 37.5-25 MG tablet    Sig: Take 1 tablet by mouth daily.    Dispense:  90 tablet    Refill:  0  . Cholecalciferol 50 MCG (2000 UT) TABS    Sig: Take 2 tablets (4,000 Units total) by mouth daily.    Dispense:  180 tablet    Refill:  0     Follow-up: Return in about 2 months (around 08/16/2019).  Scarlette Calico, MD

## 2019-06-20 ENCOUNTER — Encounter: Payer: Self-pay | Admitting: Internal Medicine

## 2019-06-20 LAB — ZINC: Zinc: 68 ug/dL (ref 60–130)

## 2019-06-20 LAB — VITAMIN B1: Vitamin B1 (Thiamine): 8 nmol/L (ref 8–30)

## 2019-06-28 ENCOUNTER — Encounter: Payer: Self-pay | Admitting: Internal Medicine

## 2019-07-04 ENCOUNTER — Other Ambulatory Visit: Payer: Self-pay

## 2019-07-06 ENCOUNTER — Other Ambulatory Visit (HOSPITAL_COMMUNITY)
Admission: RE | Admit: 2019-07-06 | Discharge: 2019-07-06 | Disposition: A | Discharge status: Home / Self Care | Payer: Federal, State, Local not specified - PPO | Source: Ambulatory Visit | Attending: Obstetrics & Gynecology | Admitting: Obstetrics & Gynecology

## 2019-07-06 ENCOUNTER — Other Ambulatory Visit: Payer: Self-pay

## 2019-07-06 ENCOUNTER — Ambulatory Visit: Payer: Federal, State, Local not specified - PPO | Admitting: Certified Nurse Midwife

## 2019-07-06 ENCOUNTER — Encounter: Payer: Self-pay | Admitting: Certified Nurse Midwife

## 2019-07-06 VITALS — BP 120/80 | HR 72 | Temp 97.2°F | Resp 16 | Ht 69.0 in | Wt 199.0 lb

## 2019-07-06 DIAGNOSIS — N951 Menopausal and female climacteric states: Secondary | ICD-10-CM | POA: Diagnosis not present

## 2019-07-06 DIAGNOSIS — Z124 Encounter for screening for malignant neoplasm of cervix: Secondary | ICD-10-CM | POA: Diagnosis not present

## 2019-07-06 DIAGNOSIS — Z01419 Encounter for gynecological examination (general) (routine) without abnormal findings: Secondary | ICD-10-CM

## 2019-07-06 DIAGNOSIS — Z8742 Personal history of other diseases of the female genital tract: Secondary | ICD-10-CM | POA: Diagnosis not present

## 2019-07-06 DIAGNOSIS — Z9889 Other specified postprocedural states: Secondary | ICD-10-CM

## 2019-07-06 LAB — HM PAP SMEAR

## 2019-07-06 NOTE — Progress Notes (Signed)
53 y.o. [id number] Married  African American Fe here to establish gyn care for annual exam. Patient  having hot flashes, night sweats and vaginal dryness at times. Last bleeding light on 10/2018.  Sees Dr Ronnald Ramp for hypertension/bronchitis and Lexapro management.   No LMP recorded. Patient has had an ablation.          Sexually active: Yes.    The current method of family planning is tubal ligation.    Exercising: Yes.    walking & hoola hoop Smoker:  no  Review of Systems  Constitutional: Negative.   HENT: Negative.   Eyes: Negative.   Respiratory: Negative.   Cardiovascular: Negative.   Gastrointestinal: Negative.   Genitourinary: Negative.   Musculoskeletal: Negative.   Skin: Negative.   Neurological: Negative.   Endo/Heme/Allergies: Negative.   Psychiatric/Behavioral: Negative.     Health Maintenance: Pap:  2017 ASCUS HPV NR neg, no other abnormal history History of Abnormal Pap: no MMG:  05-13-18 category b density birads 1:neg Self Breast exams: yes Colonoscopy:  2018 polyps f/u 97yrs BMD:   none TDaP:  2013 Shingles: no Pneumonia: no Hep C and HIV: HIV neg yrs ago Labs: if needed   reports that she quit smoking about 19 years ago. Her smoking use included cigarettes. She quit after 5.00 years of use. She has never used smokeless tobacco. She reports current alcohol use of about 5.0 - 6.0 standard drinks of alcohol per week. She reports that she does not use drugs.  Past Medical History:  Diagnosis Date  . Allergy   . Anemia   . Anxiety   . Arthritis    arm/ wrist areas  . ASCUS of cervix with negative high risk HPV   . B12 deficiency    resolved  . Constipation    not chronic- now stools soft and regular   . Depression    no meds  . Family history of adverse reaction to anesthesia    one sister ponv and slow to awaken  . History of kidney stones   . Hypertension   . Urinary incontinence wears depends    Past Surgical History:  Procedure Laterality Date  .  BUNIONECTOMY Bilateral   . caesarean section     x 1  . cystoscopic     extraction of ureteric calculus without disintegration  . CYSTOSCOPY W/ RETROGRADES Left 06/28/2018   Procedure: CYSTOSCOPY WITH RETROGRADE PYELOGRAM LEFT STENT;  Surgeon: Ceasar Mons, MD;  Location: [facility];  Service: Urology;  Laterality: Left;  . CYSTOSCOPY/URETEROSCOPY/HOLMIUM LASER/STENT PLACEMENT Left 07/16/2018   Procedure: CYSTOSCOPY/URETEROSCOPY/HOLMIUM LASER/STENT EXCHANGE;  Surgeon: Ceasar Mons, MD;  Location: [facility];  Service: Urology;  Laterality: Left;  . DILITATION & CURRETTAGE/HYSTROSCOPY WITH NOVASURE ABLATION N/A 06/17/2013   Procedure: DILATATION & CURETTAGE/HYSTEROSCOPY WITH NOVASURE ABLATION;  Surgeon: Osborne Oman, MD;  Location: [facility];  Service: Gynecology;  Laterality: N/A;  . 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 HEMORRHOIDECTOMY WITH HEMORRHOID BANDING  10/19/2012   [facility] [place] Hem ligation/pexy  . TUBAL LIGATION    . WISDOM TOOTH EXTRACTION      Current Outpatient Medications  Medication Sig Dispense Refill  . Acetaminophen (TYLENOL PO) Take by mouth as needed.    Marland Kitchen albuterol (PROVENTIL HFA;VENTOLIN HFA) 108 (90 Base) MCG/ACT inhaler Inhale 1-2 puffs into the lungs every 6 (six)  hours as needed for wheezing or shortness of breath. 1 Inhaler 0  . Cholecalciferol 50 MCG (2000 UT) TABS Take 2 tablets (4,000 Units total) by mouth daily. 180 tablet 0  . escitalopram (LEXAPRO) 20 MG tablet Take 1 tablet (20 mg total) by mouth daily. 90 tablet 1  . nebivolol (BYSTOLIC) 5 MG tablet Take 1 tablet (5 mg total) by mouth daily. 90 tablet 0  . triamterene-hydrochlorothiazide (MAXZIDE-25) 37.5-25 MG tablet Take 1 tablet by mouth daily. 90 tablet 0  . UNABLE TO FIND Apple cidar gummy    . calcium citrate  (CALCITRATE - DOSED IN MG ELEMENTAL CALCIUM) 950 MG tablet Take 200 mg of elemental calcium by mouth daily.    . Multiple Vitamins-Minerals (MULTIVITAMIN WITH MINERALS) tablet Take 1 tablet by mouth daily. Bariatric chewable      No current facility-administered medications for this visit.     Family History  Problem Relation Age of Onset  . Hypertension Mother   . Hypothyroidism Mother   . Rectal cancer Mother 27  . Colon cancer Mother 41  . Hypotension Sister   . Hypothyroidism Sister   . Diabetes Sister   . Other Sister        pacemaker  . Seizures Brother   . Hypothyroidism Sister   . Seizures Sister   . Anemia Sister   . Seizures Paternal Grandfather   . Stroke Maternal Uncle        > 55  . Deep vein thrombosis Sister   . Diabetes Maternal Grandmother   . Kidney failure Maternal Grandmother   . Heart attack Maternal Grandfather   . Other Paternal Grandmother        tumors throughout body  . Diabetes Maternal Uncle   . Hypertension Maternal Uncle     ROS:  Pertinent items are noted in HPI.  Otherwise, a comprehensive ROS was negative.  Exam:   BP 120/80   Pulse 72   Temp (!) 97.2 F (36.2 C) (Skin)   Resp 16   Ht 5\' 9"  (1.753 m)   Wt 199 lb (90.3 kg)   BMI 29.39 kg/m  Height: 5\' 9"  (175.3 cm) Ht Readings from Last 3 Encounters:  07/06/19 5\' 9"  (1.753 m)  06/16/19 5\' 9"  (1.753 m)  02/22/19 5\' 9"  (1.753 m)    General appearance: alert, cooperative and appears stated age Head: Normocephalic, without obvious abnormality, atraumatic Neck: no adenopathy, supple, symmetrical, trachea midline and thyroid normal to inspection and palpation Lungs: clear to auscultation bilaterally Breasts: normal appearance, no masses or tenderness, No nipple retraction or dimpling, No nipple discharge or bleeding, No axillary or supraclavicular adenopathy, slightly pendulous Heart: regular rate and rhythm Abdomen: soft, non-tender; no masses,  no organomegaly Extremities:  extremities normal, atraumatic, no cyanosis or edema Skin: Skin color, texture, turgor normal. No rashes or lesions Lymph nodes: Cervical, supraclavicular, and axillary nodes normal. No abnormal inguinal nodes palpated Neurologic: Grossly normal   Pelvic: External genitalia:  no lesions              Urethra:  normal appearing urethra with no masses, tenderness or lesions              Bartholin's and Skene's: normal                 Vagina: normal appearing vagina with normal color and discharge, no lesions              Cervix: multiparous appearance, no cervical motion tenderness, no  lesions and no bleeding with pap smear              Pap taken: Yes.   Bimanual Exam:  Uterus:  normal size, contour, position, consistency, mobility, non-tender              Adnexa: normal adnexa and no mass, fullness, tenderness               Rectovaginal: Confirms               Anus:  normal sphincter tone, no lesions  Chaperone present: yes  A:  Well Woman with normal exam  Perimenopausal symptomatic  Amenorrhea with ablation light bleeding 03/2019  History of abnormal pap smear in 2017 ASCUS negative HPV  Hypertension, bronchitis, Lexapro, Vitamin  D management with PCP   P:   Reviewed health and wellness pertinent to exam  Aware of need to advise if vaginal bleeding again, which may occur during perimenopausal changes. Etiology and expectations with symptoms discussed. Printed information given.  Lab: FSH, Prolactin  (recent TSH normal)  Continue follow up with PCP as indicated.  Pap smear: yes   counseled on breast self exam, mammography screening, feminine hygiene, adequate intake of calcium and vitamin D, diet and exercise  return annually or prn  An After Visit Summary was printed and given to the patient.

## 2019-07-06 NOTE — Patient Instructions (Signed)
EXERCISE AND DIET:  We recommended that you start or continue a regular exercise program for good health. Regular exercise means any activity that makes your heart beat faster and makes you sweat.  We recommend exercising at least 30 minutes per day at least 3 days a week, preferably 4 or 5.  We also recommend a diet low in fat and sugar.  Inactivity, poor dietary choices and obesity can cause diabetes, heart attack, stroke, and kidney damage, among others.    ALCOHOL AND SMOKING:  Women should limit their alcohol intake to no more than 7 drinks/beers/glasses of wine (combined, not each!) per week. Moderation of alcohol intake to this level decreases your risk of breast cancer and liver damage. And of course, no recreational drugs are part of a healthy lifestyle.  And absolutely no smoking or even second hand smoke. Most people know smoking can cause heart and lung diseases, but did you know it also contributes to weakening of your bones? Aging of your skin?  Yellowing of your teeth and nails?  CALCIUM AND VITAMIN D:  Adequate intake of calcium and Vitamin D are recommended.  The recommendations for exact amounts of these supplements seem to change often, but generally speaking 600 mg of calcium (either carbonate or citrate) and 800 units of Vitamin D per day seems prudent. Certain women may benefit from higher intake of Vitamin D.  If you are among these women, your doctor will have told you during your visit.    PAP SMEARS:  Pap smears, to check for cervical cancer or precancers,  have traditionally been done yearly, although recent scientific advances have shown that most women can have pap smears less often.  However, every woman still should have a physical exam from her gynecologist every year. It will include a breast check, inspection of the vulva and vagina to check for abnormal growths or skin changes, a visual exam of the cervix, and then an exam to evaluate the size and shape of the uterus and  ovaries.  And after 52 years of age, a rectal exam is indicated to check for rectal cancers. We will also provide age appropriate advice regarding health maintenance, like when you should have certain vaccines, screening for sexually transmitted diseases, bone density testing, colonoscopy, mammograms, etc.   MAMMOGRAMS:  All women over 40 years old should have a yearly mammogram. Many facilities now offer a "3D" mammogram, which may cost around $50 extra out of pocket. If possible,  we recommend you accept the option to have the 3D mammogram performed.  It both reduces the number of women who will be called back for extra views which then turn out to be normal, and it is better than the routine mammogram at detecting truly abnormal areas.    COLONOSCOPY:  Colonoscopy to screen for colon cancer is recommended for all women at age 50.  We know, you hate the idea of the prep.  We agree, BUT, having colon cancer and not knowing it is worse!!  Colon cancer so often starts as a polyp that can be seen and removed at colonscopy, which can quite literally save your life!  And if your first colonoscopy is normal and you have no family history of colon cancer, most women don't have to have it again for 10 years.  Once every ten years, you can do something that may end up saving your life, right?  We will be happy to help you get it scheduled when you are ready.    Be sure to check your insurance coverage so you understand how much it will cost.  It may be covered as a preventative service at no cost, but you should check your particular policy.      Perimenopause  Perimenopause is the normal time of life before and after menstrual periods stop completely (menopause). Perimenopause can begin 2-8 years before menopause, and it usually lasts for 1 year after menopause. During perimenopause, the ovaries may or may not produce an egg. What are the causes? This condition is caused by a natural change in hormone levels that  happens as you get older. What increases the risk? This condition is more likely to start at an earlier age if you have certain medical conditions or treatments, including:  A tumor of the pituitary gland in the brain.  A disease that affects the ovaries and hormone production.  Radiation treatment for cancer.  Certain cancer treatments, such as chemotherapy or hormone (anti-estrogen) therapy.  Heavy smoking and excessive alcohol use.  Family history of early menopause. What are the signs or symptoms? Perimenopausal changes affect each woman differently. Symptoms of this condition may include:  Hot flashes.  Night sweats.  Irregular menstrual periods.  Decreased sex drive.  Vaginal dryness.  Headaches.  Mood swings.  Depression.  Memory problems or trouble concentrating.  Irritability.  Tiredness.  Weight gain.  Anxiety.  Trouble getting pregnant. How is this diagnosed? This condition is diagnosed based on your medical history, a physical exam, your age, your menstrual history, and your symptoms. Hormone tests may also be done. How is this treated? In some cases, no treatment is needed. You and your health care provider should make a decision together about whether treatment is necessary. Treatment will be based on your individual condition and preferences. Various treatments are available, such as:  Menopausal hormone therapy (MHT).  Medicines to treat specific symptoms.  Acupuncture.  Vitamin or herbal supplements. Before starting treatment, make sure to let your health care provider know if you have a personal or family history of:  Heart disease.  Breast cancer.  Blood clots.  Diabetes.  Osteoporosis. Follow these instructions at home: Lifestyle  Do not use any products that contain nicotine or tobacco, such as cigarettes and e-cigarettes. If you need help quitting, ask your health care provider.  Eat a balanced diet that includes fresh  fruits and vegetables, whole grains, soybeans, eggs, lean meat, and low-fat dairy.  Get at least 30 minutes of physical activity on 5 or more days each week.  Avoid alcoholic and caffeinated beverages, as well as spicy foods. This may help prevent hot flashes.  Get 7-8 hours of sleep each night.  Dress in layers that can be removed to help you manage hot flashes.  Find ways to manage stress, such as deep breathing, meditation, or journaling. General instructions  Keep track of your menstrual periods, including: ? When they occur. ? How heavy they are and how long they last. ? How much time passes between periods.  Keep track of your symptoms, noting when they start, how often you have them, and how long they last.  Take over-the-counter and prescription medicines only as told by your health care provider.  Take vitamin supplements only as told by your health care provider. These may include calcium, vitamin E, and vitamin D.  Use vaginal lubricants or moisturizers to help with vaginal dryness and improve comfort during sex.  Talk with your health care provider before starting any herbal supplements.    Keep all follow-up visits as told by your health care provider. This is important. This includes any group therapy or counseling. Contact a health care provider if:  You have heavy vaginal bleeding or pass blood clots.  Your period lasts more than 2 days longer than normal.  Your periods are recurring sooner than 21 days.  You bleed after having sex. Get help right away if:  You have chest pain, trouble breathing, or trouble talking.  You have severe depression.  You have pain when you urinate.  You have severe headaches.  You have vision problems. Summary  Perimenopause is the time when a woman's body begins to move into menopause. This may happen naturally or as a result of other health problems or medical treatments.  Perimenopause can begin 2-8 years before  menopause, and it usually lasts for 1 year after menopause.  Perimenopausal symptoms can be managed through medicines, lifestyle changes, and complementary therapies such as acupuncture. This information is not intended to replace advice given to you by your health care provider. Make sure you discuss any questions you have with your health care provider. Document Released: 08/28/2004 Document Revised: 07/03/2017 Document Reviewed: 08/26/2016 Elsevier Patient Education  2020 [employer].

## 2019-07-07 LAB — FOLLICLE STIMULATING HORMONE: FSH: 69.3 m[IU]/mL

## 2019-07-07 LAB — PROLACTIN: Prolactin: 4.8 ng/mL (ref 4.8–23.3)

## 2019-07-08 LAB — CYTOLOGY - PAP
Comment: NEGATIVE
Diagnosis: NEGATIVE
High risk HPV: NEGATIVE

## 2019-07-14 ENCOUNTER — Encounter: Payer: Self-pay | Admitting: Family Medicine

## 2019-07-14 ENCOUNTER — Ambulatory Visit: Payer: Self-pay

## 2019-07-14 ENCOUNTER — Ambulatory Visit (INDEPENDENT_AMBULATORY_CARE_PROVIDER_SITE_OTHER): Payer: Federal, State, Local not specified - PPO | Admitting: Family Medicine

## 2019-07-14 VITALS — BP 130/86 | Ht 69.0 in | Wt 197.8 lb

## 2019-07-14 DIAGNOSIS — M79645 Pain in left finger(s): Secondary | ICD-10-CM | POA: Diagnosis not present

## 2019-07-14 DIAGNOSIS — M654 Radial styloid tenosynovitis [de Quervain]: Secondary | ICD-10-CM | POA: Diagnosis not present

## 2019-07-14 MED ORDER — HYDROCODONE-ACETAMINOPHEN 5-325 MG PO TABS: 1.0000 | ORAL_TABLET | Freq: Four times a day (QID) | ORAL | 0 refills | Status: DC | PRN

## 2019-07-14 NOTE — Patient Instructions (Addendum)
Thank you for coming in today. Use the brace as needed.  Use hydrocodone for severe pain.  Use over the counter voltaren gel 4x daily for pain as needed.  Recheck with me in a few weeks if not getting any better.  We may consider hand therpay (hand pt or occupational therapy).   Call or go to the ER if you develop a large red swollen joint with extreme pain or oozing puss.   We're moving!  Dr. Clovis Riley new office will be located at [address] on the 1st floor.  This location is across the street from the [employer] and in the same complex as the [facility] and [employer]  Our new office phone number will be [phone].  We anticipate beginning to see patients at the [facility] office in early December 2020.

## 2019-07-14 NOTE — Progress Notes (Signed)
I, Mary Sanford, LAT, ATC, am serving as scribe for Dr. Lynne Sanford.  Mary Sanford is a 53 y.o. female who presents to [place] today for f/u of L hand pain.  Pt was last seen by Dr. Paulla Sanford on 11/03/17 for L wrist pain and [name]'s tenosynovitis and received a L first CMC thumb injection.  She had a L wrist MRI on 10/14/17.  Since her last visit, she reports increased pain in her L hand near her thenar eminence and thumb for a few days w/ no known MOI.   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  Pt rates her pain as a constant, 9/10 aching pain.  She has tried ice and heat.  She does have a brace from the last time her thumb and hand were bothering her but she has not tried the brace again.  Aggravating factors include typing, L thumb circumduction and pressure to her L thumb. Pain is consistent with previous pain March and April 2019.  She notes the first [facility] injection with Dr. Paulla Sanford performed then was very helpful.   ROS:  As above  Exam:  BP 130/86 (BP Location: Right Arm, Patient Position: Sitting, Cuff Size: Normal)   Ht 5\' 9"  (1.753 m)   Wt 197 lb 12.8 oz (89.7 kg)   BMI 29.21 kg/m  Wt Readings from Last 5 Encounters:  07/14/19 197 lb 12.8 oz (89.7 kg)  07/06/19 199 lb (90.3 kg)  06/16/19 198 lb (89.8 kg)  02/22/19 202 lb (91.6 kg)  02/21/19 218 lb 14.7 oz (99.3 kg)   General: Well Developed, well nourished, and in no acute distress.  Neuro/Psych: Alert and oriented x3, extra-ocular muscles intact, able to move all 4 extremities, sensation grossly intact. Skin: Warm and dry, no rashes noted.  Respiratory: Not using accessory muscles, speaking in full sentences, trachea midline.  Cardiovascular: Pulses palpable, no extremity edema. Abdomen: Does not appear distended. MSK:  Left hand relatively normal.  No significant deformity. Normal motion. Tender palpation first CMC dorsal hand at the thenar eminence.  Nontender at radial styloid. Motion is intact.  Strength is intact. Pulses cap refill and sensation are intact distally.    Lab and Radiology Results MRI report and images from March 2019 reviewed showing first [place] DJD, and muscle edema at thenar eminence  Procedure: Real-time Ultrasound Guided Injection of first CMC left hand Device: Philips Affiniti 50G Images permanently stored and available for review in the ultrasound unit. Verbal informed consent obtained.  Discussed risks and benefits of procedure. Warned about infection bleeding damage to structures skin hypopigmentation and fat atrophy among others. Patient expresses understanding and agreement Time-out conducted.   Noted no overlying erythema, induration, or other signs of local infection.   Skin prepped in a sterile fashion.   Local anesthesia: Topical Ethyl chloride.   With sterile technique and under real time ultrasound guidance:  40 mg of Depo-Medrol and 0.5 mL of Marcaine.  Total volume 1 mL injected easily.   Completed without difficulty   Pain immediately resolved suggesting accurate placement of the medication.   Advised to call if fevers/chills, erythema, induration, drainage, or persistent bleeding.   Images permanently stored and available for review in the ultrasound unit.  Impression: Technically successful ultrasound guided injection.         Assessment and Plan: 53 y.o. female with left base of thumb thenar eminence and dorsal hand pain.  Patient had excellent  immediate significant reduction in pain following injection indicating first [place] as primary pain generator.  Plan to proceed with diclofenac gel, thumb spica splint as needed, and limited hydrocodone for pain control.  Work note provided.  Recheck back in a few weeks especially if not improving.  Would consider hand therapy is reasonable future next step if needed.   PDMP reviewed during this encounter. Orders Placed This Encounter  Procedures   . NO CHG - [place] UPPER LEFT    Order Specific Question:   Reason for Exam (SYMPTOM  OR DIAGNOSIS REQUIRED)    Answer:   L thumb pain    Order Specific Question:   Preferred imaging location?    Answer:   [place] ordered this encounter  Medications  . HYDROcodone-acetaminophen (NORCO/VICODIN) 5-325 MG tablet    Sig: Take 1 tablet by mouth every 6 (six) hours as needed.    Dispense:  15 tablet    Refill:  0    Historical information moved to improve visibility of documentation.  Past Medical History:  Diagnosis Date  . Allergy   . Anemia   . Anxiety   . Arthritis    arm/ wrist areas  . ASCUS of cervix with negative high risk HPV   . B12 deficiency    resolved  . Constipation    not chronic- now stools soft and regular   . Depression    no meds  . Family history of adverse reaction to anesthesia    one sister ponv and slow to awaken  . History of kidney stones   . Hypertension   . Urinary incontinence wears depends   Past Surgical History:  Procedure Laterality Date  . BUNIONECTOMY Bilateral   . caesarean section     x 1  . cystoscopic     extraction of ureteric calculus without disintegration  . CYSTOSCOPY W/ RETROGRADES Left 06/28/2018   Procedure: CYSTOSCOPY WITH RETROGRADE PYELOGRAM LEFT STENT;  Surgeon: Ceasar Mons, MD;  Location: [facility];  Service: Urology;  Laterality: Left;  . CYSTOSCOPY/URETEROSCOPY/HOLMIUM LASER/STENT PLACEMENT Left 07/16/2018   Procedure: CYSTOSCOPY/URETEROSCOPY/HOLMIUM LASER/STENT EXCHANGE;  Surgeon: Ceasar Mons, MD;  Location: [facility];  Service: Urology;  Laterality: Left;  . DILITATION & CURRETTAGE/HYSTROSCOPY WITH NOVASURE ABLATION N/A 06/17/2013   Procedure: DILATATION & CURETTAGE/HYSTEROSCOPY WITH NOVASURE ABLATION;  Surgeon: Osborne Oman, MD;  Location: [facility];  Service: Gynecology;  Laterality: N/A;  . 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 HEMORRHOIDECTOMY WITH HEMORRHOID BANDING  10/19/2012   [facility] [place] Hem ligation/pexy  . TUBAL LIGATION    . WISDOM TOOTH EXTRACTION     Social History   Tobacco Use  . Smoking status: Former Smoker    Years: 5.00    Types: Cigarettes    Quit date: 08/05/1999    Years since quitting: 19.9  . Smokeless tobacco: Never Used  . Tobacco comment: 2-5 cigarettes per day  Substance Use Topics  . Alcohol use: Yes    Alcohol/week: 5.0 - 6.0 standard drinks    Types: 5 - 6 Standard drinks or equivalent per week   family history includes Anemia in her sister; Colon cancer (age of onset: 38) in her mother; Deep vein thrombosis in her sister; Diabetes in her maternal grandmother, maternal uncle, and sister; Heart attack in her maternal  grandfather; Hypertension in her maternal uncle and mother; Hypotension in her sister; Hypothyroidism in her mother, sister, and sister; Kidney failure in her maternal grandmother; Other in her paternal grandmother and sister; Rectal cancer (age of onset: 38) in her mother; Seizures in her brother, paternal grandfather, and sister; Stroke in her maternal uncle.  Medications: Current Outpatient Medications  Medication Sig Dispense Refill  . Acetaminophen (TYLENOL PO) Take by mouth as needed.    Marland Kitchen albuterol (PROVENTIL HFA;VENTOLIN HFA) 108 (90 Base) MCG/ACT inhaler Inhale 1-2 puffs into the lungs every 6 (six) hours as needed for wheezing or shortness of breath. 1 Inhaler 0  . calcium citrate (CALCITRATE - DOSED IN MG ELEMENTAL CALCIUM) 950 MG tablet Take 200 mg of elemental calcium by mouth daily.    . Cholecalciferol 50 MCG (2000 UT) TABS Take 2 tablets (4,000 Units total) by mouth daily. 180 tablet 0  . escitalopram (LEXAPRO) 20 MG tablet Take 1 tablet (20 mg total) by mouth daily. 90 tablet 1  . Multiple Vitamins-Minerals (MULTIVITAMIN WITH  MINERALS) tablet Take 1 tablet by mouth daily. Bariatric chewable     . nebivolol (BYSTOLIC) 5 MG tablet Take 1 tablet (5 mg total) by mouth daily. 90 tablet 0  . triamterene-hydrochlorothiazide (MAXZIDE-25) 37.5-25 MG tablet Take 1 tablet by mouth daily. 90 tablet 0  . UNABLE TO FIND Apple cidar gummy    . HYDROcodone-acetaminophen (NORCO/VICODIN) 5-325 MG tablet Take 1 tablet by mouth every 6 (six) hours as needed. 15 tablet 0   No current facility-administered medications for this visit.   Allergies  Allergen Reactions  . Naproxen Other (See Comments)    Panic attacks No Problem with Motrin      Discussed warning signs or symptoms. Please see discharge instructions. Patient expresses understanding.  The above documentation has been reviewed and is accurate and complete Mary Sanford

## 2019-07-27 ENCOUNTER — Ambulatory Visit (INDEPENDENT_AMBULATORY_CARE_PROVIDER_SITE_OTHER): Payer: Federal, State, Local not specified - PPO | Admitting: Podiatry

## 2019-07-27 ENCOUNTER — Encounter: Payer: Self-pay | Admitting: Podiatry

## 2019-07-27 ENCOUNTER — Ambulatory Visit (INDEPENDENT_AMBULATORY_CARE_PROVIDER_SITE_OTHER): Payer: Federal, State, Local not specified - PPO

## 2019-07-27 ENCOUNTER — Telehealth: Payer: Self-pay | Admitting: Podiatry

## 2019-07-27 ENCOUNTER — Other Ambulatory Visit: Payer: Self-pay | Admitting: Podiatry

## 2019-07-27 ENCOUNTER — Other Ambulatory Visit: Payer: Self-pay

## 2019-07-27 DIAGNOSIS — M79672 Pain in left foot: Secondary | ICD-10-CM

## 2019-07-27 DIAGNOSIS — M722 Plantar fascial fibromatosis: Secondary | ICD-10-CM

## 2019-07-27 DIAGNOSIS — Z472 Encounter for removal of internal fixation device: Secondary | ICD-10-CM

## 2019-07-27 NOTE — Telephone Encounter (Signed)
DATE OF OFFICE SURGERY: 08/03/2019  SURGICAL PROCEDURE: Removal Fixation Deep Kwire/Screw (Pin) Top of Left Foot  Federal BCBS Policy Effective : [id number]  -  08/03/9998  Member Liability Summary       In-Network   Max Per Benefit Period Year-to-Date Remaining     CoInsurance         Deductible       Out-Of-Pocket 3 $5,500.00 $4,118.28 3 Out-of-Pocket includes copay, deductible, and coinsurance.  Professional (Physician) Visit - Office      In-Network Copay Coinsurance Authorization Required $40  per  Visit Not Applicable No Messages: SPECIALIST

## 2019-07-27 NOTE — Progress Notes (Signed)
Subjective:   Patient ID: Mary Sanford, female   DOB: 53 y.o.   MRN: [id number]   HPI Patient presents stating I am getting pain on top of my left foot and I am not sure if there may be something there and also my heels are really bothering me   ROS      Objective:  Physical Exam  Neurovascular status intact with patient fibrotic exquisite discomfort plantar aspect heel region bilateral with inflammation fluid buildup and does have some prominence of the first metatarsal shaft left where pin had been previously placed     Assessment:  Pin irritation left foot dorsal with inflammation and chronic plantar fasciitis bilateral     Plan:  H&P condition reviewed and today I went ahead discussed the pin and recommended removal and she wants this done.  I allowed patient to read consent form going over removal and after review she signed.  I then did sterile prep and injected the fascia bilateral 3 mg Kenalog 5 mg Xylocaine and reviewed her x-rays  X-rays left indicate excellent alignment fixation in place with mild prominence of the proximal pin

## 2019-08-03 ENCOUNTER — Ambulatory Visit (INDEPENDENT_AMBULATORY_CARE_PROVIDER_SITE_OTHER): Payer: Federal, State, Local not specified - PPO | Admitting: Podiatry

## 2019-08-03 ENCOUNTER — Encounter: Payer: Self-pay | Admitting: Podiatry

## 2019-08-03 ENCOUNTER — Other Ambulatory Visit: Payer: Self-pay

## 2019-08-03 DIAGNOSIS — Z472 Encounter for removal of internal fixation device: Secondary | ICD-10-CM | POA: Diagnosis not present

## 2019-08-03 MED ORDER — HYDROCODONE-ACETAMINOPHEN 10-325 MG PO TABS: 1.0000 | ORAL_TABLET | Freq: Three times a day (TID) | ORAL | 0 refills | Status: AC | PRN

## 2019-08-03 NOTE — Progress Notes (Signed)
Subjective:   Patient ID: Mary Sanford, female   DOB: 53 y.o.   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  Patient is brought to the OR placed on the supine position and the patient's left foot was prepped and draped utilizing standard aseptic technique a Ace wrap was introduced and then the tourniquet was inflated to 50 mmHg.  I went ahead and I identified the top of the left foot prominent area and made approximate 3 cm incision medial to the extensor hallucis longus took it through the subcutaneous tissue with hemostasis being acquired as necessary further to capsule and made a linear capsular incision.  Sharply dissected off the underlying capsule and expose the pin which was in an abnormal position I removed it in toto flushed the wound with peroxide Garamycin solution and sutured with 4-0 nylon.  Sterile dressing applied instructed on keeping the dressing dry and patient left the OR in satisfactory condition with a prescription for hydrocodone for the postoperative.

## 2019-08-15 ENCOUNTER — Ambulatory Visit (INDEPENDENT_AMBULATORY_CARE_PROVIDER_SITE_OTHER): Payer: Federal, State, Local not specified - PPO

## 2019-08-15 ENCOUNTER — Ambulatory Visit (INDEPENDENT_AMBULATORY_CARE_PROVIDER_SITE_OTHER): Payer: Federal, State, Local not specified - PPO | Admitting: Podiatry

## 2019-08-15 ENCOUNTER — Encounter: Payer: Self-pay | Admitting: Podiatry

## 2019-08-15 ENCOUNTER — Other Ambulatory Visit: Payer: Self-pay

## 2019-08-15 DIAGNOSIS — Z472 Encounter for removal of internal fixation device: Secondary | ICD-10-CM | POA: Diagnosis not present

## 2019-08-16 ENCOUNTER — Encounter: Payer: Self-pay | Admitting: Internal Medicine

## 2019-08-16 ENCOUNTER — Ambulatory Visit (INDEPENDENT_AMBULATORY_CARE_PROVIDER_SITE_OTHER): Payer: Federal, State, Local not specified - PPO | Admitting: Internal Medicine

## 2019-08-16 VITALS — BP 140/90 | HR 80 | Temp 98.2°F | Ht 69.0 in | Wt 198.8 lb

## 2019-08-16 DIAGNOSIS — J069 Acute upper respiratory infection, unspecified: Secondary | ICD-10-CM | POA: Diagnosis not present

## 2019-08-16 DIAGNOSIS — D508 Other iron deficiency anemias: Secondary | ICD-10-CM

## 2019-08-16 DIAGNOSIS — I10 Essential (primary) hypertension: Secondary | ICD-10-CM

## 2019-08-16 LAB — BASIC METABOLIC PANEL
BUN: 14 mg/dL (ref 6–23)
CO2: 28 mEq/L (ref 19–32)
Calcium: 9.8 mg/dL (ref 8.4–10.5)
Chloride: 101 mEq/L (ref 96–112)
Creatinine, Ser: 0.76 mg/dL (ref 0.40–1.20)
GFR: 96.6 mL/min (ref >60.00)
Glucose, Bld: 97 mg/dL (ref 70–99)
Potassium: 4.1 mEq/L (ref 3.5–5.1)
Sodium: 137 mEq/L (ref 135–145)

## 2019-08-16 LAB — CBC WITH DIFFERENTIAL/PLATELET
Basophils Absolute: 0.1 10*3/uL (ref 0.0–0.1)
Basophils Relative: 1.4 % (ref 0.0–3.0)
Eosinophils Absolute: 0.2 10*3/uL (ref 0.0–0.7)
Eosinophils Relative: 3.8 % (ref 0.0–5.0)
HCT: 43.2 % (ref 36.0–46.0)
Hemoglobin: 13.5 g/dL (ref 12.0–15.0)
Lymphocytes Relative: 40.4 % (ref 12.0–46.0)
Lymphs Abs: 2.2 10*3/uL (ref 0.7–4.0)
MCHC: 31.2 g/dL (ref 30.0–36.0)
MCV: 75.7 fl — ABNORMAL LOW (ref 78.0–100.0)
Monocytes Absolute: 0.7 10*3/uL (ref 0.1–1.0)
Monocytes Relative: 12.6 % — ABNORMAL HIGH (ref 3.0–12.0)
Neutro Abs: 2.3 10*3/uL (ref 1.4–7.7)
Neutrophils Relative %: 41.8 % — ABNORMAL LOW (ref 43.0–77.0)
Platelets: 227 10*3/uL (ref 150.0–400.0)
RBC: 5.71 Mil/uL — ABNORMAL HIGH (ref 3.87–5.11)
RDW: 13.9 % (ref 11.5–15.5)
WBC: 5.5 10*3/uL (ref 4.0–10.5)

## 2019-08-16 LAB — SARS-COV-2 IGG: SARS-COV-2 IgG: 0.02

## 2019-08-16 NOTE — Progress Notes (Signed)
Subjective:  Patient ID: Mary Sanford, female    DOB: 1966-06-18  Age: 54 y.o. MRN: [id number]  CC: URI and Hypertension   This visit occurred during the SARS-CoV-2 public health emergency.  Safety protocols were in place, including screening questions prior to the visit, additional usage of staff PPE, and extensive cleaning of exam room while observing appropriate contact time as indicated for disinfecting solutions.    HPI Mary Sanford presents for f/up - She complains of a 4-day history of mild sore throat, nonproductive cough, and postnasal drip.  She denies fever, chills, night sweats, chest pain, shortness of breath, hemoptysis, or headache.  She wants to be tested for COVID-19.  Outpatient Medications Prior to Visit  Medication Sig Dispense Refill  . Acetaminophen (TYLENOL PO) Take by mouth as needed.    Marland Kitchen albuterol (PROVENTIL HFA;VENTOLIN HFA) 108 (90 Base) MCG/ACT inhaler Inhale 1-2 puffs into the lungs every 6 (six) hours as needed for wheezing or shortness of breath. 1 Inhaler 0  . calcium citrate (CALCITRATE - DOSED IN MG ELEMENTAL CALCIUM) 950 MG tablet Take 200 mg of elemental calcium by mouth daily.    . Cholecalciferol 50 MCG (2000 UT) TABS Take 2 tablets (4,000 Units total) by mouth daily. 180 tablet 0  . escitalopram (LEXAPRO) 20 MG tablet Take 1 tablet (20 mg total) by mouth daily. 90 tablet 1  . HYDROcodone-acetaminophen (NORCO/VICODIN) 5-325 MG tablet Take 1 tablet by mouth every 6 (six) hours as needed. 15 tablet 0  . Multiple Vitamins-Minerals (MULTIVITAMIN WITH MINERALS) tablet Take 1 tablet by mouth daily. Bariatric chewable     . nebivolol (BYSTOLIC) 5 MG tablet Take 1 tablet (5 mg total) by mouth daily. 90 tablet 0  . triamterene-hydrochlorothiazide (MAXZIDE-25) 37.5-25 MG tablet Take 1 tablet by mouth daily. 90 tablet 0  . UNABLE TO FIND Apple cidar gummy     No facility-administered medications prior to visit.    ROS Review of Systems    Constitutional: Negative for chills, diaphoresis, fatigue and fever.  HENT: Positive for postnasal drip and sore throat. Negative for ear pain, facial swelling, sinus pressure, sinus pain, trouble swallowing and voice change.   Eyes: Negative.   Respiratory: Positive for cough. Negative for chest tightness, shortness of breath and wheezing.   Cardiovascular: Negative for chest pain, palpitations and leg swelling.  Gastrointestinal: Negative for abdominal pain, diarrhea, nausea and vomiting.  Endocrine: Negative.   Genitourinary: Negative.  Negative for difficulty urinating and hematuria.  Musculoskeletal: Negative.  Negative for myalgias.  Skin: Negative.   Neurological: Negative.  Negative for dizziness, weakness, light-headedness and headaches.  Hematological: Negative for adenopathy. Does not bruise/bleed easily.  Psychiatric/Behavioral: Negative.     Objective:  BP 140/90 (BP Location: Left Arm, Patient Position: Sitting, Cuff Size: Large)   Pulse 80   Temp 98.2 F (36.8 C) (Oral)   Ht 5\' 9"  (1.753 m)   Wt 198 lb 12.8 oz (90.2 kg)   SpO2 98%   BMI 29.36 kg/m   BP Readings from Last 3 Encounters:  08/16/19 140/90  07/14/19 130/86  07/06/19 120/80    Wt Readings from Last 3 Encounters:  08/16/19 198 lb 12.8 oz (90.2 kg)  07/14/19 197 lb 12.8 oz (89.7 kg)  07/06/19 199 lb (90.3 kg)    Physical Exam Vitals reviewed.  Constitutional:      General: She is not in acute distress.    Appearance: Normal appearance. She is not ill-appearing, toxic-appearing or diaphoretic.  HENT:  Nose: Nose normal.     Mouth/Throat:     Tongue: No lesions.     Pharynx: Posterior oropharyngeal erythema present. No pharyngeal swelling or oropharyngeal exudate.     Tonsils: No tonsillar exudate or tonsillar abscesses.  Eyes:     General: No scleral icterus.    Conjunctiva/sclera: Conjunctivae normal.  Cardiovascular:     Rate and Rhythm: Normal rate and regular rhythm.     Heart  sounds: No murmur.  Pulmonary:     Effort: Pulmonary effort is normal.     Breath sounds: No stridor. No wheezing, rhonchi or rales.  Abdominal:     General: Abdomen is flat. Bowel sounds are normal. There is no distension.     Palpations: There is no hepatomegaly or splenomegaly.     Tenderness: There is no abdominal tenderness.  Musculoskeletal:        General: Normal range of motion.     Cervical back: Neck supple.     Right lower leg: No edema.     Left lower leg: No edema.  Lymphadenopathy:     Cervical: No cervical adenopathy.  Skin:    General: Skin is warm and dry.     Coloration: Skin is not pale.     Findings: No rash.  Neurological:     General: No focal deficit present.     Mental Status: She is alert.  Psychiatric:        Mood and Affect: Mood normal.        Behavior: Behavior normal.     Lab Results  Component Value Date   WBC 5.5 08/16/2019   HGB 13.5 08/16/2019   HCT 43.2 08/16/2019   PLT 227.0 08/16/2019   GLUCOSE 97 08/16/2019   CHOL 117 06/16/2019   TRIG 106.0 06/16/2019   HDL 57.40 06/16/2019   LDLCALC 39 06/16/2019   ALT 25 06/16/2019   AST 19 06/16/2019   NA 137 08/16/2019   K 4.1 08/16/2019   CL 101 08/16/2019   CREATININE 0.76 08/16/2019   BUN 14 08/16/2019   CO2 28 08/16/2019   TSH 1.18 06/16/2019   HGBA1C 5.5 09/01/2018    No results found.  Assessment & Plan:   Mary Sanford was seen today for uri and hypertension.  Diagnoses and all orders for this visit:  Viral URI with cough-I informed her that PCR testing is not an option at this site.  She was advised where to go if she wants to do a PCR testing for COVID-19.  Her antibody test is negative.  I think her symptoms are consistent with a viral upper respiratory infection.  I do not think this is COVID-19. -     SARS-COV-2 IgG  Essential hypertension, benign- Her blood pressure is adequately well controlled.  Electrolytes and renal function are normal. -     Basic metabolic  panel  Other iron deficiency anemia- Her H&H are normal now. -     CBC with Differential   I am having Mary Sanford maintain her albuterol, multivitamin with minerals, calcium citrate, escitalopram, nebivolol, triamterene-hydrochlorothiazide, Cholecalciferol, Acetaminophen (TYLENOL PO), UNABLE TO FIND, and HYDROcodone-acetaminophen.  No orders of the defined types were placed in this encounter.    Follow-up: Return in about 6 months (around 02/13/2020).  Mary Calico, MD

## 2019-08-16 NOTE — Patient Instructions (Signed)
Viral Respiratory Infection A respiratory infection is an illness that affects part of the respiratory system, such as the lungs, nose, or throat. A respiratory infection that is caused by a virus is called a viral respiratory infection. Common types of viral respiratory infections include:  A cold.  The flu (influenza).  A respiratory syncytial virus (RSV) infection. What are the causes? This condition is caused by a virus. What are the signs or symptoms? Symptoms of this condition include:  A stuffy or runny nose.  Yellow or green nasal discharge.  A cough.  Sneezing.  Fatigue.  Achy muscles.  A sore throat.  Sweating or chills.  A fever.  A headache. How is this diagnosed? This condition may be diagnosed based on:  Your symptoms.  A physical exam.  Testing of nasal swabs. How is this treated? This condition may be treated with medicines, such as:  Antiviral medicine. This may shorten the length of time a person has symptoms.  Expectorants. These make it easier to cough up mucus.  Decongestant nasal sprays.  Acetaminophen or NSAIDs to relieve fever and pain. Antibiotic medicines are not prescribed for viral infections. This is because antibiotics are designed to kill bacteria. They are not effective against viruses. Follow these instructions at home:  Managing pain and congestion  Take over-the-counter and prescription medicines only as told by your health care provider.  If you have a sore throat, gargle with a salt-water mixture 3-4 times a day or as needed. To make a salt-water mixture, completely dissolve -1 tsp of salt in 1 cup of warm water.  Use nose drops made from salt water to ease congestion and soften raw skin around your nose.  Drink enough fluid to keep your urine pale yellow. This helps prevent dehydration and helps loosen up mucus. General instructions  Rest as much as possible.  Do not drink alcohol.  Do not use any products  that contain nicotine or tobacco, such as cigarettes and e-cigarettes. If you need help quitting, ask your health care provider.  Keep all follow-up visits as told by your health care provider. This is important. How is this prevented?   Get an annual flu shot. You may get the flu shot in late summer, fall, or winter. Ask your health care provider when you should get your flu shot.  Avoid exposing others to your respiratory infection. ? Stay home from work or school as told by your health care provider. ? Wash your hands with soap and water often, especially after you cough or sneeze. If soap and water are not available, use alcohol-based hand sanitizer.  Avoid contact with people who are sick during cold and flu season. This is generally fall and winter. Contact a health care provider if:  Your symptoms last for 10 days or longer.  Your symptoms get worse over time.  You have a fever.  You have severe sinus pain in your face or forehead.  The glands in your jaw or neck become very swollen. Get help right away if you:  Feel pain or pressure in your chest.  Have shortness of breath.  Faint or feel like you will faint.  Have severe and persistent vomiting.  Feel confused or disoriented. Summary  A respiratory infection is an illness that affects part of the respiratory system, such as the lungs, nose, or throat. A respiratory infection that is caused by a virus is called a viral respiratory infection.  Common types of viral respiratory infections are a   cold, influenza, and respiratory syncytial virus (RSV) infection.  Symptoms of this condition include a stuffy or runny nose, cough, sneezing, fatigue, achy muscles, sore throat, and fevers or chills.  Antibiotic medicines are not prescribed for viral infections. This is because antibiotics are designed to kill bacteria. They are not effective against viruses. This information is not intended to replace advice given to you by  your health care provider. Make sure you discuss any questions you have with your health care provider. Document Revised: 07/29/2018 Document Reviewed: 08/31/2017 Elsevier Patient Education  2020 Elsevier Inc.  

## 2019-08-17 NOTE — Progress Notes (Signed)
Subjective:   Patient ID: Mary Sanford, female   DOB: 54 y.o.   MRN: [id number]   HPI Patient presents stating I am doing excellent with my foot with minimal discomfort and wearing shoe gear currently   ROS      Objective:  Physical Exam  Neurovascular status intact with patient's left dorsal foot healing well wound edges well coapted stitches intact     Assessment:  Doing well post pin removal left     Plan:  Allowed patient to begin increased activity remove stitches applied dressing reviewed x-rays and will reappoint as needed  X-rays indicate that there is satisfactory removal of pin with good alignment no bone structural movement at all

## 2019-08-30 ENCOUNTER — Other Ambulatory Visit: Payer: Self-pay | Admitting: Internal Medicine

## 2019-08-30 ENCOUNTER — Encounter: Payer: Self-pay | Admitting: Internal Medicine

## 2019-08-30 DIAGNOSIS — Z1231 Encounter for screening mammogram for malignant neoplasm of breast: Secondary | ICD-10-CM

## 2019-08-30 DIAGNOSIS — F418 Other specified anxiety disorders: Secondary | ICD-10-CM

## 2019-08-31 ENCOUNTER — Encounter: Payer: Federal, State, Local not specified - PPO | Admitting: Podiatry

## 2019-09-05 ENCOUNTER — Other Ambulatory Visit: Payer: Self-pay

## 2019-09-05 ENCOUNTER — Ambulatory Visit
Admission: RE | Admit: 2019-09-05 | Discharge: 2019-09-05 | Disposition: A | Discharge status: Home / Self Care | Payer: Federal, State, Local not specified - PPO | Source: Ambulatory Visit | Attending: Internal Medicine | Admitting: Internal Medicine

## 2019-09-05 DIAGNOSIS — Z1231 Encounter for screening mammogram for malignant neoplasm of breast: Secondary | ICD-10-CM | POA: Diagnosis not present

## 2019-09-06 LAB — HM MAMMOGRAPHY

## 2019-10-03 ENCOUNTER — Other Ambulatory Visit: Payer: Self-pay | Admitting: Internal Medicine

## 2019-10-03 DIAGNOSIS — I10 Essential (primary) hypertension: Secondary | ICD-10-CM

## 2019-10-05 ENCOUNTER — Encounter: Payer: Self-pay | Admitting: Obstetrics and Gynecology

## 2019-10-05 ENCOUNTER — Other Ambulatory Visit (HOSPITAL_COMMUNITY)
Admission: RE | Admit: 2019-10-05 | Discharge: 2019-10-05 | Disposition: A | Discharge status: Home / Self Care | Payer: Federal, State, Local not specified - PPO | Source: Ambulatory Visit | Attending: Obstetrics and Gynecology | Admitting: Obstetrics and Gynecology

## 2019-10-05 ENCOUNTER — Other Ambulatory Visit: Payer: Self-pay

## 2019-10-05 ENCOUNTER — Ambulatory Visit: Payer: Federal, State, Local not specified - PPO | Admitting: Obstetrics and Gynecology

## 2019-10-05 ENCOUNTER — Telehealth: Payer: Self-pay | Admitting: Certified Nurse Midwife

## 2019-10-05 VITALS — BP 110/72 | HR 75 | Temp 97.0°F | Ht 69.0 in | Wt 210.0 lb

## 2019-10-05 DIAGNOSIS — N950 Postmenopausal bleeding: Secondary | ICD-10-CM | POA: Insufficient documentation

## 2019-10-05 DIAGNOSIS — Z9889 Other specified postprocedural states: Secondary | ICD-10-CM

## 2019-10-05 DIAGNOSIS — N719 Inflammatory disease of uterus, unspecified: Secondary | ICD-10-CM | POA: Diagnosis not present

## 2019-10-05 NOTE — Telephone Encounter (Signed)
Spoke to pt. Pt states started bleeding and was told to inform Debbi,CNM if happens again per [facility] notes on 07/06/2019. Pt states red bleeding when wiping that started this morning and some cramps. Denies heavy bleeding and clots. Pt had last bleeding since Nov. Pt wanting to know what to do. Will review with Debbi, CNM and return call to pt. Pt agreeable.   Routing to D. Hollice Espy, CNM for recommendations.

## 2019-10-05 NOTE — Telephone Encounter (Signed)
She will need to come due PMB, may need to schedule with MD

## 2019-10-05 NOTE — Telephone Encounter (Signed)
Spoke back with pt. Pt agreeable to OV and to see MD. Pt scheduled to see first available provider. Pt is scheduled with Dr Talbert Nan 10/05/2019 at 11 am. Pt verbalized understanding.   Routing to D.Hollice Espy, CNM and Dr Talbert Nan  for review

## 2019-10-05 NOTE — Patient Instructions (Signed)

## 2019-10-05 NOTE — Progress Notes (Signed)
GYNECOLOGY  VISIT   HPI: 54 y.o.   Married Black or [place] American Not Hispanic or Latino  female   [phone] with No LMP recorded. Patient has had an ablation.   here for  Postmenopausal bleeding. She states that she went to the bathroom this morning and saw red blood when wiped     The patient underwent an endometrial ablation in 2014 for menorrhagia. She had monthly light cycles after the ablation. Started getting irregular in ~2015-2016.  Her last bleeding was in 3/20. This morning she noticed spotting when she went to the bathroom. No intercourse in the last few days.  She has mild lower abdominal cramping. She has some vaginal dryness. Some hot flashes and night sweats, improved.     [place] from December was 70, Pap was negative with negative HPV.  GYNECOLOGIC HISTORY: No LMP recorded. Patient has had an ablation. Contraception:none Menopausal hormone therapy: non e        OB History    Gravida  7   Para  5   Term  4   Preterm  1   AB  2   Living  5     SAB  0   TAB  2   Ectopic  0   Multiple  0   Live Births  5              Patient Active Problem List   Diagnosis Date Noted  . Status post bariatric surgery 06/16/2019  . Vitamin D insufficiency 06/16/2019  . Viral URI with cough 02/22/2019  . Chronic low back pain 06/22/2018  . Severe obesity ([place]) 06/22/2018  . ASCUS pap with negative high risk HPV on 06/30/16 06/30/2016  . Depression with anxiety 12/28/2015  . Ulcer aphthous oral 11/20/2014  . Anemia, iron deficiency 09/17/2013  . Routine general medical examination at a health care facility 01/12/2013  . Allergic rhinitis 01/12/2013  . Constipation, chronic 10/04/2012  . Visit for screening mammogram 01/12/2012  . B12 deficiency anemia 01/09/2012  . Obesity (BMI 30-39.9) 01/09/2012  . Essential hypertension, benign 11/24/2011    Past Medical History:  Diagnosis Date  . Allergy   . Anemia   . Anxiety   . Arthritis    arm/ wrist areas   . ASCUS of cervix with negative high risk HPV   . B12 deficiency    resolved  . Constipation    not chronic- now stools soft and regular   . Depression    no meds  . Family history of adverse reaction to anesthesia    one sister ponv and slow to awaken  . History of kidney stones   . Hypertension   . Urinary incontinence wears depends    Past Surgical History:  Procedure Laterality Date  . BUNIONECTOMY Bilateral   . caesarean section     x 1  . cystoscopic     extraction of ureteric calculus without disintegration  . CYSTOSCOPY W/ RETROGRADES Left 06/28/2018   Procedure: CYSTOSCOPY WITH RETROGRADE PYELOGRAM LEFT STENT;  Surgeon: Ceasar Mons, MD;  Location: [facility];  Service: Urology;  Laterality: Left;  . CYSTOSCOPY/URETEROSCOPY/HOLMIUM LASER/STENT PLACEMENT Left 07/16/2018   Procedure: CYSTOSCOPY/URETEROSCOPY/HOLMIUM LASER/STENT EXCHANGE;  Surgeon: Ceasar Mons, MD;  Location[facility];  Service: Urology;  Laterality: Left;  . DILITATION & CURRETTAGE/HYSTROSCOPY WITH NOVASURE ABLATION N/A 06/17/2013   Procedure: DILATATION & CURETTAGE/HYSTEROSCOPY WITH NOVASURE ABLATION;  Surgeon: Osborne Oman, MD;  Location: [facility];  Service: Gynecology;  Laterality: N/A;  . 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 HEMORRHOIDECTOMY WITH HEMORRHOID BANDING  10/19/2012   [facility] [place] Hem ligation/pexy  . TUBAL LIGATION    . WISDOM TOOTH EXTRACTION      Current Outpatient Medications  Medication Sig Dispense Refill  . Acetaminophen (TYLENOL PO) Take by mouth as needed.    Marland Kitchen albuterol (PROVENTIL HFA;VENTOLIN HFA) 108 (90 Base) MCG/ACT inhaler Inhale 1-2 puffs into the lungs every 6 (six) hours as needed for wheezing or shortness of breath. 1 Inhaler 0  . calcium citrate (CALCITRATE - DOSED IN MG  ELEMENTAL CALCIUM) 950 MG tablet Take 200 mg of elemental calcium by mouth daily.    Marland Kitchen escitalopram (LEXAPRO) 20 MG tablet Take 1 tablet by mouth daily 90 tablet 1  . Multiple Vitamins-Minerals (MULTIVITAMIN WITH MINERALS) tablet Take 1 tablet by mouth daily. Bariatric chewable     . nebivolol (BYSTOLIC) 5 MG tablet Take 1 tablet (5 mg total) by mouth daily. 90 tablet 0  . triamterene-hydrochlorothiazide (MAXZIDE-25) 37.5-25 MG tablet Take 1 tablet by mouth daily. 90 tablet 1  . UNABLE TO FIND Apple cidar gummy     No current facility-administered medications for this visit.     ALLERGIES: Naproxen  Family History  Problem Relation Age of Onset  . Hypertension Mother   . Hypothyroidism Mother   . Rectal cancer Mother 22  . Colon cancer Mother 64  . Hypotension Sister   . Hypothyroidism Sister   . Diabetes Sister   . Other Sister        pacemaker  . Seizures Brother   . Hypothyroidism Sister   . Seizures Sister   . Anemia Sister   . Seizures Paternal Grandfather   . Stroke Maternal Uncle        > 55  . Deep vein thrombosis Sister   . Diabetes Maternal Grandmother   . Kidney failure Maternal Grandmother   . Heart attack Maternal Grandfather   . Other Paternal Grandmother        tumors throughout body  . Diabetes Maternal Uncle   . Hypertension Maternal Uncle     Social History   Socioeconomic History  . Marital status: Married    Spouse name: Not on file  . Number of children: Not on file  . Years of education: Not on file  . Highest education level: Not on file  Occupational History  . Occupation: minister  Tobacco Use  . Smoking status: Former Smoker    Years: 5.00    Types: Cigarettes    Quit date: 08/05/1999    Years since quitting: 20.1  . Smokeless tobacco: Never Used  . Tobacco comment: 2-5 cigarettes per day  Substance and Sexual Activity  . Alcohol use: Yes    Alcohol/week: 5.0 - 6.0 standard drinks    Types: 5 - 6 Standard drinks or equivalent per  week  . Drug use: No  . Sexual activity: Yes    Partners: Male    Birth control/protection: Surgical    Comment: BTL, Ablation  Other Topics Concern  . Not on file  Social History Narrative  . Not on file   Social Determinants of Health   Financial Resource Strain:   . Difficulty of Paying Living Expenses: Not on file  Food Insecurity:   . Worried About Charity fundraiser in  the Last Year: Not on file  . Ran Out of Food in the Last Year: Not on file  Transportation Needs:   . Lack of Transportation (Medical): Not on file  . Lack of Transportation (Non-Medical): Not on file  Physical Activity:   . Days of Exercise per Week: Not on file  . Minutes of Exercise per Session: Not on file  Stress:   . Feeling of Stress : Not on file  Social Connections:   . Frequency of Communication with Friends and Family: Not on file  . Frequency of Social Gatherings with Friends and Family: Not on file  . Attends Religious Services: Not on file  . Active Member of Clubs or Organizations: Not on file  . Attends Archivist Meetings: Not on file  . Marital Status: Not on file  Intimate Partner Violence:   . Fear of Current or Ex-Partner: Not on file  . Emotionally Abused: Not on file  . Physically Abused: Not on file  . Sexually Abused: Not on file    Review of Systems  Genitourinary:       Vaginal bleeding   All other systems reviewed and are negative.   PHYSICAL EXAMINATION:    BP 110/72   Pulse 75   Temp (!) 97 F (36.1 C)   Ht 5\' 9"  (1.753 m)   Wt 210 lb (95.3 kg)   SpO2 96%   BMI 31.01 kg/m     General appearance: alert, cooperative and appears stated age Neck: no adenopathy, supple, symmetrical, trachea midline and thyroid normal to inspection and palpation Abdomen: soft, non-tender; non distended, no masses,  no organomegaly  Pelvic: External genitalia:  no lesions              Urethra:  normal appearing urethra with no masses, tenderness or lesions               Bartholins and Skenes: normal                 Vagina: normal appearing vagina with normal color and discharge, no lesions. Blood seen coming from her cervix              Cervix: no lesions              Bimanual Exam:  Uterus:  no masses or tenderness              Adnexa: no mass, fullness, tenderness              Rectovaginal: Yes.  .  Confirms.              Anus:  normal sphincter tone, no lesions  The risks of endometrial biopsy were reviewed and a consent was obtained.  A speculum was placed in the vagina and the cervix was cleansed with betadine. A tenaculum was placed on the cervix and the pipelle and then the mini-pipelle were placed into the endometrial cavity. Only able to sound to 4-5 cm. Attempted dilation with the 3 hagar dilator, wasn't able to get any further than 4-5 cm. The biopsy was obtained with both pipelles, a small amount of tissue was obtained. The tenaculum and speculum were removed. There were no complications.    Chaperone was present for exam.  ASSESSMENT PMP bleeding H/O endometrial ablation    PLAN Endometrial biopsy done, but concern about adequacy of the sample. Suspect scaring from her prior endometrial ablation Return for ultrasound, possible sonohysterogram   An After  Visit Summary was printed and given to the patient.

## 2019-10-05 NOTE — Telephone Encounter (Signed)
Patient has started bleeding and was told to call if she did.

## 2019-10-10 LAB — SURGICAL PATHOLOGY

## 2019-10-18 ENCOUNTER — Other Ambulatory Visit: Payer: Federal, State, Local not specified - PPO

## 2019-10-18 ENCOUNTER — Other Ambulatory Visit: Payer: Federal, State, Local not specified - PPO | Admitting: Obstetrics and Gynecology

## 2019-10-18 ENCOUNTER — Telehealth: Payer: Self-pay

## 2019-10-18 NOTE — Telephone Encounter (Signed)
Patient called and stated they cannot make it to the appointment this afternoon. Patient wants to reschedule the two appointments for a [facility]

## 2019-10-18 NOTE — Telephone Encounter (Signed)
Spoke with patient, [facility] r/s to 11/01/19 at 2pm, consult to follow with Dr. Talbert Nan. Patient verbalizes understanding and is agreeable.   Routing to provider for final review. Patient is agreeable to disposition. Will close encounter.  Cc: Wilson Singer, Magdalene Patricia

## 2019-10-24 ENCOUNTER — Encounter: Payer: Self-pay | Admitting: Certified Nurse Midwife

## 2019-10-27 ENCOUNTER — Other Ambulatory Visit: Payer: Self-pay | Admitting: Family Medicine

## 2019-10-27 ENCOUNTER — Ambulatory Visit: Payer: Federal, State, Local not specified - PPO | Admitting: Internal Medicine

## 2019-10-27 ENCOUNTER — Ambulatory Visit: Payer: Federal, State, Local not specified - PPO | Attending: Internal Medicine

## 2019-10-27 ENCOUNTER — Other Ambulatory Visit: Payer: Self-pay | Admitting: Internal Medicine

## 2019-10-27 DIAGNOSIS — Z1231 Encounter for screening mammogram for malignant neoplasm of breast: Secondary | ICD-10-CM

## 2019-10-27 DIAGNOSIS — Z0289 Encounter for other administrative examinations: Secondary | ICD-10-CM

## 2019-10-31 ENCOUNTER — Telehealth: Payer: Self-pay

## 2019-10-31 NOTE — Telephone Encounter (Signed)
Spoke to pt. Pt states starting cycle on 10/30/2019 and is wanting to reschedule [facility]. Pt rescheduled for 11/08/2019 at 1 pm then to have follow up with Dr Talbert Nan afterwards. Pt aware and agreeable.   Routing to Dr Talbert Nan for review and will close encounter.

## 2019-10-31 NOTE — Telephone Encounter (Signed)
Patient needs to reschedule ultrasound appointment for (11/01/19) due to cycle beginning.

## 2019-11-01 ENCOUNTER — Other Ambulatory Visit: Payer: Self-pay

## 2019-11-01 ENCOUNTER — Other Ambulatory Visit: Payer: Self-pay | Admitting: Obstetrics and Gynecology

## 2019-11-03 ENCOUNTER — Ambulatory Visit: Payer: Self-pay

## 2019-11-03 ENCOUNTER — Ambulatory Visit: Payer: Federal, State, Local not specified - PPO | Attending: Family

## 2019-11-03 DIAGNOSIS — Z23 Encounter for immunization: Secondary | ICD-10-CM

## 2019-11-03 NOTE — Progress Notes (Signed)
   Covid-19 Vaccination Clinic  Name:  Mary Sanford    MRN: [id number] DOB: 24-Oct-1965  11/03/2019  [name] was observed post Covid-19 immunization for 15 minutes without incident. She was provided with Vaccine Information Sheet and instruction to access the V-Safe system.   [name] was instructed to call 911 with any severe reactions post vaccine: Marland Kitchen Difficulty breathing  . Swelling of face and throat  . A fast heartbeat  . A bad rash all over body  . Dizziness and weakness   Immunizations Administered    Name Date Dose VIS Date Route   Moderna COVID-19 Vaccine 11/03/2019  4:24 PM 0.5 mL 07/05/2019 Intramuscular   Manufacturer: Moderna   Lot: [id number]   [id number]

## 2019-11-07 ENCOUNTER — Other Ambulatory Visit: Payer: Self-pay

## 2019-11-08 ENCOUNTER — Ambulatory Visit: Payer: Federal, State, Local not specified - PPO | Admitting: Obstetrics and Gynecology

## 2019-11-08 ENCOUNTER — Other Ambulatory Visit: Payer: Self-pay | Admitting: Obstetrics and Gynecology

## 2019-11-08 ENCOUNTER — Other Ambulatory Visit: Payer: Self-pay

## 2019-11-08 ENCOUNTER — Encounter: Payer: Self-pay | Admitting: Obstetrics and Gynecology

## 2019-11-08 ENCOUNTER — Ambulatory Visit (INDEPENDENT_AMBULATORY_CARE_PROVIDER_SITE_OTHER): Payer: Federal, State, Local not specified - PPO

## 2019-11-08 VITALS — BP 115/62 | HR 67 | Temp 97.2°F | Ht 69.0 in | Wt 203.4 lb

## 2019-11-08 DIAGNOSIS — N950 Postmenopausal bleeding: Secondary | ICD-10-CM

## 2019-11-08 DIAGNOSIS — Z9889 Other specified postprocedural states: Secondary | ICD-10-CM

## 2019-11-08 NOTE — Progress Notes (Signed)
GYNECOLOGY  VISIT   HPI: 54 y.o.   Married Black or [place] American Not Hispanic or Latino  female   [phone] with No LMP recorded. Patient has had an ablation.   here for evaluation of postmenopausal bleeding.    H/O endometrial ablation in 2014. Cycles stopped in 3/20. [place] in 12/20 was 70. Attempted endometrial biopsy from 10/05/19 without definitive endometrium identified. Unable to get passed 5 cm with sounding her uterus.   GYNECOLOGIC HISTORY: No LMP recorded. Patient has had an ablation. Contraception: none Menopausal hormone therapy: none        OB History    Gravida  7   Para  5   Term  4   Preterm  1   AB  2   Living  5     SAB  0   TAB  2   Ectopic  0   Multiple  0   Live Births  5              Patient Active Problem List   Diagnosis Date Noted  . Status post bariatric surgery 06/16/2019  . Vitamin D insufficiency 06/16/2019  . Viral URI with cough 02/22/2019  . Chronic low back pain 06/22/2018  . Severe obesity ([place]) 06/22/2018  . ASCUS pap with negative high risk HPV on 06/30/16 06/30/2016  . Depression with anxiety 12/28/2015  . Ulcer aphthous oral 11/20/2014  . Anemia, iron deficiency 09/17/2013  . Routine general medical examination at a health care facility 01/12/2013  . Allergic rhinitis 01/12/2013  . Constipation, chronic 10/04/2012  . Visit for screening mammogram 01/12/2012  . B12 deficiency anemia 01/09/2012  . Obesity (BMI 30-39.9) 01/09/2012  . Essential hypertension, benign 11/24/2011    Past Medical History:  Diagnosis Date  . Allergy   . Anemia   . Anxiety   . Arthritis    arm/ wrist areas  . ASCUS of cervix with negative high risk HPV   . B12 deficiency    resolved  . Constipation    not chronic- now stools soft and regular   . Depression    no meds  . Family history of adverse reaction to anesthesia    one sister ponv and slow to awaken  . History of kidney stones   . Hypertension   . Urinary incontinence  wears depends    Past Surgical History:  Procedure Laterality Date  . BUNIONECTOMY Bilateral   . caesarean section     x 1  . cystoscopic     extraction of ureteric calculus without disintegration  . CYSTOSCOPY W/ RETROGRADES Left 06/28/2018   Procedure: CYSTOSCOPY WITH RETROGRADE PYELOGRAM LEFT STENT;  Surgeon: Ceasar Mons, MD;  Location: [facility];  Service: Urology;  Laterality: Left;  . CYSTOSCOPY/URETEROSCOPY/HOLMIUM LASER/STENT PLACEMENT Left 07/16/2018   Procedure: CYSTOSCOPY/URETEROSCOPY/HOLMIUM LASER/STENT EXCHANGE;  Surgeon: Ceasar Mons, MD;  Location: [facility];  Service: Urology;  Laterality: Left;  . DILITATION & CURRETTAGE/HYSTROSCOPY WITH NOVASURE ABLATION N/A 06/17/2013   Procedure: DILATATION & CURETTAGE/HYSTEROSCOPY WITH NOVASURE ABLATION;  Surgeon: Osborne Oman, MD;  Location: [facility];  Service: Gynecology;  Laterality: N/A;  . 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 HEMORRHOIDECTOMY WITH HEMORRHOID BANDING  10/19/2012   [facility] [place] Hem ligation/pexy  . TUBAL LIGATION    . WISDOM TOOTH EXTRACTION  Current Outpatient Medications  Medication Sig Dispense Refill  . Acetaminophen (TYLENOL PO) Take by mouth as needed.    Marland Kitchen albuterol (PROVENTIL HFA;VENTOLIN HFA) 108 (90 Base) MCG/ACT inhaler Inhale 1-2 puffs into the lungs every 6 (six) hours as needed for wheezing or shortness of breath. 1 Inhaler 0  . calcium citrate (CALCITRATE - DOSED IN MG ELEMENTAL CALCIUM) 950 MG tablet Take 200 mg of elemental calcium by mouth daily.    Marland Kitchen escitalopram (LEXAPRO) 20 MG tablet Take 1 tablet by mouth daily 90 tablet 1  . Multiple Vitamins-Minerals (MULTIVITAMIN WITH MINERALS) tablet Take 1 tablet by mouth daily. Bariatric chewable     . nebivolol (BYSTOLIC) 5 MG tablet Take 1 tablet (5 mg  total) by mouth daily. 90 tablet 0  . triamterene-hydrochlorothiazide (MAXZIDE-25) 37.5-25 MG tablet Take 1 tablet by mouth daily. 90 tablet 1  . UNABLE TO FIND Apple cidar gummy     No current facility-administered medications for this visit.     ALLERGIES: Naproxen  Family History  Problem Relation Age of Onset  . Hypertension Mother   . Hypothyroidism Mother   . Rectal cancer Mother 19  . Colon cancer Mother 69  . Hypotension Sister   . Hypothyroidism Sister   . Diabetes Sister   . Other Sister        pacemaker  . Seizures Brother   . Hypothyroidism Sister   . Seizures Sister   . Anemia Sister   . Seizures Paternal Grandfather   . Stroke Maternal Uncle        > 55  . Deep vein thrombosis Sister   . Diabetes Maternal Grandmother   . Kidney failure Maternal Grandmother   . Heart attack Maternal Grandfather   . Other Paternal Grandmother        tumors throughout body  . Diabetes Maternal Uncle   . Hypertension Maternal Uncle     Social History   Socioeconomic History  . Marital status: Married    Spouse name: Not on file  . Number of children: Not on file  . Years of education: Not on file  . Highest education level: Not on file  Occupational History  . Occupation: minister  Tobacco Use  . Smoking status: Former Smoker    Years: 5.00    Types: Cigarettes    Quit date: 08/05/1999    Years since quitting: 20.2  . Smokeless tobacco: Never Used  . Tobacco comment: 2-5 cigarettes per day  Substance and Sexual Activity  . Alcohol use: Yes    Alcohol/week: 5.0 - 6.0 standard drinks    Types: 5 - 6 Standard drinks or equivalent per week  . Drug use: No  . Sexual activity: Yes    Partners: Male    Birth control/protection: Surgical    Comment: BTL, Ablation  Other Topics Concern  . Not on file  Social History Narrative  . Not on file   Social Determinants of Health   Financial Resource Strain:   . Difficulty of Paying Living Expenses:   Food Insecurity:    . Worried About Charity fundraiser in the Last Year:   . Arboriculturist in the Last Year:   Transportation Needs:   . Film/video editor (Medical):   Marland Kitchen Lack of Transportation (Non-Medical):   Physical Activity:   . Days of Exercise per Week:   . Minutes of Exercise per Session:   Stress:   . Feeling of Stress :   Social  Connections:   . Frequency of Communication with Friends and Family:   . Frequency of Social Gatherings with Friends and Family:   . Attends Religious Services:   . Active Member of Clubs or Organizations:   . Attends Archivist Meetings:   Marland Kitchen Marital Status:   Intimate Partner Violence:   . Fear of Current or Ex-Partner:   . Emotionally Abused:   Marland Kitchen Physically Abused:   . Sexually Abused:     ROS  PHYSICAL EXAMINATION:    There were no vitals taken for this visit.    General appearance: alert, cooperative and appears stated age  Pelvic: External genitalia:  no lesions              Urethra:  normal appearing urethra with no masses, tenderness or lesions              Bartholins and Skenes: normal                 Vagina: normal appearing vagina with normal color and discharge, no lesions              Cervix:  no lesions  Sonohysterogram The procedure and risks of the procedure were reviewed with the patient, consent form was signed. A speculum was placed in the vagina and the cervix was cleansed with betadine. A tenaculum was placed on the cervix. Unable to pass the sonohysterogram catheter. An attempt was made to dilate the cervix, unable to see with the ultrasound. Placed the catheter into the cervix, saline infused, not able to see any dilation of the cavity. The tenaculum and catheter were removed.     Chaperone was present for exam.  Reviewed ultrasound images with the patient  ASSESSMENT Postmenopausal bleeding, h/o endometrial ablation. Unable to evaluate the endometrium adequately in the office. No concerning features on  ultrasound, but distorted cavity.     PLAN We discussed the options of; doing nothing (risk of missing an early cancer or precancer), attempt at hysteroscopy with D&C under ultrasound guidance in the OR, or hysterectomy. After discussion, will plan hysteroscopy, D&C in the OR with ultrasound guidance  In addition to reviewing the ultrasound and the sonohysterogram, ~10 minutes in total patient care.     tempte  Attempted sonohysterogram was unsuccessful. [place]

## 2019-11-10 ENCOUNTER — Telehealth: Payer: Self-pay | Admitting: Obstetrics and Gynecology

## 2019-11-10 NOTE — Telephone Encounter (Signed)
Spoke with patient regarding surgery benefits. Patient acknowledges understanding of information presented. Patient is aware that benefits presented are for professional benefits only. Patient is aware that once surgery is scheduled, the hospital will call with separate benefits. Patient is aware of surgery cancellation policy.  Patient is ready to proceed with scheduling surgery.

## 2019-11-11 NOTE — Telephone Encounter (Signed)
Spoke with patient. Advised scheduled for surgery on 12/13/2019 at 0730 at [facility]. Pre op scheduled for 11/23/2019 at 2:30 pm with Dr.Jertson. COVID test scheduled for 12/09/2019 at 9:10 am. Patient is aware of the need to quarantine after test until surgery. 2 week post op scheduled for 12/28/2019 at 3 pm with Dr.Jertson. Patient is agreeable to date and time. Surgery instructions given and mailed to patient's verified home address on file. Patient verbalizes understanding.  Routing to provider and will close encounter.

## 2019-11-11 NOTE — Telephone Encounter (Signed)
Spoke with patient. Patient would like to proceed with surgery on 12/13/2019. Patient states she will need to call back to discuss surgery and schedule pre op and post op.

## 2019-11-22 ENCOUNTER — Other Ambulatory Visit: Payer: Self-pay

## 2019-11-23 ENCOUNTER — Encounter: Payer: Self-pay | Admitting: Obstetrics and Gynecology

## 2019-11-23 ENCOUNTER — Ambulatory Visit: Payer: Federal, State, Local not specified - PPO | Admitting: Obstetrics and Gynecology

## 2019-11-23 VITALS — BP 100/62 | HR 89 | Temp 97.3°F | Ht 69.0 in | Wt 204.0 lb

## 2019-11-23 DIAGNOSIS — N950 Postmenopausal bleeding: Secondary | ICD-10-CM

## 2019-11-23 DIAGNOSIS — Z9889 Other specified postprocedural states: Secondary | ICD-10-CM

## 2019-11-23 NOTE — Progress Notes (Signed)
GYNECOLOGY  VISIT   HPI: 54 y.o.   Married Black or [place] American Not Hispanic or Latino  female   [phone] with No LMP recorded. Patient has had an ablation.   here for pre op consult. The patient presented with PMP bleeding in early March. She has a  H/o an endometrial ablation in 2014.  An endometrial biopsy was done, but there was concern about adequacy of the sample (unable to get passe 5 cm with the pipelle). Ultrasound with echogenic irregular endometrium, unable to pass the sonohysterogram catheter into the uterine cavity.   Last pap in 12/20 negative with negative HPV  GYNECOLOGIC HISTORY: No LMP recorded. Patient has had an ablation. Contraception: PMP, tubal ligation Menopausal hormone therapy: none        OB History    Gravida  7   Para  5   Term  4   Preterm  1   AB  2   Living  5     SAB  0   TAB  2   Ectopic  0   Multiple  0   Live Births  5              Patient Active Problem List   Diagnosis Date Noted  . Status post bariatric surgery 06/16/2019  . Vitamin D insufficiency 06/16/2019  . Viral URI with cough 02/22/2019  . Chronic low back pain 06/22/2018  . Severe obesity ([place]) 06/22/2018  . ASCUS pap with negative high risk HPV on 06/30/16 06/30/2016  . Depression with anxiety 12/28/2015  . Ulcer aphthous oral 11/20/2014  . Anemia, iron deficiency 09/17/2013  . Routine general medical examination at a health care facility 01/12/2013  . Allergic rhinitis 01/12/2013  . Constipation, chronic 10/04/2012  . Visit for screening mammogram 01/12/2012  . B12 deficiency anemia 01/09/2012  . Obesity (BMI 30-39.9) 01/09/2012  . Essential hypertension, benign 11/24/2011    Past Medical History:  Diagnosis Date  . Allergy   . Anemia   . Anxiety   . Arthritis    arm/ wrist areas  . ASCUS of cervix with negative high risk HPV   . B12 deficiency    resolved  . Constipation    not chronic- now stools soft and regular   . Depression    no  meds  . Family history of adverse reaction to anesthesia    one sister ponv and slow to awaken  . History of kidney stones   . Hypertension   . Urinary incontinence wears depends    Past Surgical History:  Procedure Laterality Date  . BUNIONECTOMY Bilateral   . caesarean section     x 1  . cystoscopic     extraction of ureteric calculus without disintegration  . CYSTOSCOPY W/ RETROGRADES Left 06/28/2018   Procedure: CYSTOSCOPY WITH RETROGRADE PYELOGRAM LEFT STENT;  Surgeon: Ceasar Mons, MD;  Location: [facility];  Service: Urology;  Laterality: Left;  . CYSTOSCOPY/URETEROSCOPY/HOLMIUM LASER/STENT PLACEMENT Left 07/16/2018   Procedure: CYSTOSCOPY/URETEROSCOPY/HOLMIUM LASER/STENT EXCHANGE;  Surgeon: Ceasar Mons, MD;  Location: [facility];  Service: Urology;  Laterality: Left;  . DILITATION & CURRETTAGE/HYSTROSCOPY WITH NOVASURE ABLATION N/A 06/17/2013   Procedure: DILATATION & CURETTAGE/HYSTEROSCOPY WITH NOVASURE ABLATION;  Surgeon: Osborne Oman, MD;  Location: [facility];  Service: Gynecology;  Laterality: N/A;  . 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 HEMORRHOIDECTOMY WITH HEMORRHOID BANDING  10/19/2012   SCA THD Hem ligation/pexy  . TUBAL LIGATION    . WISDOM TOOTH EXTRACTION      Current Outpatient Medications  Medication Sig Dispense Refill  . Acetaminophen (TYLENOL PO) Take by mouth as needed.    Marland Kitchen albuterol (PROVENTIL HFA;VENTOLIN HFA) 108 (90 Base) MCG/ACT inhaler Inhale 1-2 puffs into the lungs every 6 (six) hours as needed for wheezing or shortness of breath. 1 Inhaler 0  . calcium citrate (CALCITRATE - DOSED IN MG ELEMENTAL CALCIUM) 950 MG tablet Take 200 mg of elemental calcium by mouth daily.    . Cholecalciferol (VITAMIN D3) 25 MCG (1000 UT) CHEW Chew by mouth.    . escitalopram  (LEXAPRO) 20 MG tablet Take 1 tablet by mouth daily 90 tablet 1  . Multiple Vitamins-Minerals (MULTIVITAMIN WITH MINERALS) tablet Take 1 tablet by mouth daily. Bariatric chewable     . nebivolol (BYSTOLIC) 5 MG tablet Take 1 tablet (5 mg total) by mouth daily. 90 tablet 0  . triamterene-hydrochlorothiazide (MAXZIDE-25) 37.5-25 MG tablet Take 1 tablet by mouth daily. 90 tablet 1  . UNABLE TO FIND Apple cidar gummy     No current facility-administered medications for this visit.     ALLERGIES: Naproxen  Family History  Problem Relation Age of Onset  . Hypertension Mother   . Hypothyroidism Mother   . Rectal cancer Mother 52  . Colon cancer Mother 89  . Hypotension Sister   . Hypothyroidism Sister   . Diabetes Sister   . Other Sister        pacemaker  . Seizures Brother   . Hypothyroidism Sister   . Seizures Sister   . Anemia Sister   . Seizures Paternal Grandfather   . Stroke Maternal Uncle        > 55  . Deep vein thrombosis Sister   . Diabetes Maternal Grandmother   . Kidney failure Maternal Grandmother   . Heart attack Maternal Grandfather   . Other Paternal Grandmother        tumors throughout body  . Diabetes Maternal Uncle   . Hypertension Maternal Uncle     Social History   Socioeconomic History  . Marital status: Married    Spouse name: Not on file  . Number of children: Not on file  . Years of education: Not on file  . Highest education level: Not on file  Occupational History  . Occupation: minister  Tobacco Use  . Smoking status: Former Smoker    Years: 5.00    Types: Cigarettes    Quit date: 08/05/1999    Years since quitting: 20.3  . Smokeless tobacco: Never Used  . Tobacco comment: 2-5 cigarettes per day  Substance and Sexual Activity  . Alcohol use: Yes    Alcohol/week: 5.0 - 6.0 standard drinks    Types: 5 - 6 Standard drinks or equivalent per week  . Drug use: No  . Sexual activity: Yes    Partners: Male    Birth control/protection:  Surgical    Comment: BTL, Ablation  Other Topics Concern  . Not on file  Social History Narrative  . Not on file   Social Determinants of Health   Financial Resource Strain:   . Difficulty of Paying Living Expenses:   Food Insecurity:   . Worried About Charity fundraiser in the Last Year:   . Arboriculturist in the Last Year:   Transportation Needs:   .  Lack of Transportation (Medical):   Marland Kitchen Lack of Transportation (Non-Medical):   Physical Activity:   . Days of Exercise per Week:   . Minutes of Exercise per Session:   Stress:   . Feeling of Stress :   Social Connections:   . Frequency of Communication with Friends and Family:   . Frequency of Social Gatherings with Friends and Family:   . Attends Religious Services:   . Active Member of Clubs or Organizations:   . Attends Archivist Meetings:   Marland Kitchen Marital Status:   Intimate Partner Violence:   . Fear of Current or Ex-Partner:   . Emotionally Abused:   Marland Kitchen Physically Abused:   . Sexually Abused:     Review of Systems  Constitutional: Negative.   HENT: Negative.   Eyes: Negative.   Respiratory: Negative.   Cardiovascular: Negative.   Gastrointestinal: Negative.   Genitourinary: Negative.   Musculoskeletal: Negative.   Skin: Negative.   Neurological: Negative.   Endo/Heme/Allergies: Negative.   Psychiatric/Behavioral: Negative.     PHYSICAL EXAMINATION:    There were no vitals taken for this visit.    General appearance: alert, cooperative and appears stated age Neck: no adenopathy, supple, symmetrical, trachea midline and thyroid normal to inspection and palpation Heart: regular rate and rhythm Lungs: CTAB Abdomen: soft, non-tender; bowel sounds normal; no masses,  no organomegaly Extremities: normal, atraumatic, no cyanosis Skin: normal color, texture and turgor, no rashes or lesions Lymph: normal cervical supraclavicular and inguinal nodes Neurologic: grossly normal   ASSESSMENT Postmenopausal  bleeding, h/o endometrial ablation, unable to adequately evaluate the endometrium in the office. No concerning features on ultrasound, but distorted cavity.     PLAN Ultrasound guided hysteroscopy, D&C Reviewed risks, including: bleeding, infection, uterine perforation, fluid overload, need for further sugery

## 2019-11-23 NOTE — H&P (View-Only) (Signed)
GYNECOLOGY  VISIT   HPI: 54 y.o.   Married Black or [place] American Not Hispanic or Latino  female   [phone] with No LMP recorded. Patient has had an ablation.   here for pre op consult. The patient presented with PMP bleeding in early March. She has a  H/o an endometrial ablation in 2014.  An endometrial biopsy was done, but there was concern about adequacy of the sample (unable to get passe 5 cm with the pipelle). Ultrasound with echogenic irregular endometrium, unable to pass the sonohysterogram catheter into the uterine cavity.   Last pap in 12/20 negative with negative HPV  GYNECOLOGIC HISTORY: No LMP recorded. Patient has had an ablation. Contraception: PMP, tubal ligation Menopausal hormone therapy: none        OB History    Gravida  7   Para  5   Term  4   Preterm  1   AB  2   Living  5     SAB  0   TAB  2   Ectopic  0   Multiple  0   Live Births  5              Patient Active Problem List   Diagnosis Date Noted  . Status post bariatric surgery 06/16/2019  . Vitamin D insufficiency 06/16/2019  . Viral URI with cough 02/22/2019  . Chronic low back pain 06/22/2018  . Severe obesity ([place]) 06/22/2018  . ASCUS pap with negative high risk HPV on 06/30/16 06/30/2016  . Depression with anxiety 12/28/2015  . Ulcer aphthous oral 11/20/2014  . Anemia, iron deficiency 09/17/2013  . Routine general medical examination at a health care facility 01/12/2013  . Allergic rhinitis 01/12/2013  . Constipation, chronic 10/04/2012  . Visit for screening mammogram 01/12/2012  . B12 deficiency anemia 01/09/2012  . Obesity (BMI 30-39.9) 01/09/2012  . Essential hypertension, benign 11/24/2011    Past Medical History:  Diagnosis Date  . Allergy   . Anemia   . Anxiety   . Arthritis    arm/ wrist areas  . ASCUS of cervix with negative high risk HPV   . B12 deficiency    resolved  . Constipation    not chronic- now stools soft and regular   . Depression    no  meds  . Family history of adverse reaction to anesthesia    one sister ponv and slow to awaken  . History of kidney stones   . Hypertension   . Urinary incontinence wears depends    Past Surgical History:  Procedure Laterality Date  . BUNIONECTOMY Bilateral   . caesarean section     x 1  . cystoscopic     extraction of ureteric calculus without disintegration  . CYSTOSCOPY W/ RETROGRADES Left 06/28/2018   Procedure: CYSTOSCOPY WITH RETROGRADE PYELOGRAM LEFT STENT;  Surgeon: Ceasar Mons, MD;  Location: [facility];  Service: Urology;  Laterality: Left;  . CYSTOSCOPY/URETEROSCOPY/HOLMIUM LASER/STENT PLACEMENT Left 07/16/2018   Procedure: CYSTOSCOPY/URETEROSCOPY/HOLMIUM LASER/STENT EXCHANGE;  Surgeon: Ceasar Mons, MD;  Location: [facility];  Service: Urology;  Laterality: Left;  . DILITATION & CURRETTAGE/HYSTROSCOPY WITH NOVASURE ABLATION N/A 06/17/2013   Procedure: DILATATION & CURETTAGE/HYSTEROSCOPY WITH NOVASURE ABLATION;  Surgeon: Osborne Oman, MD;  Location: [place] [facility];  Service: Gynecology;  Laterality: N/A;  . 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 HEMORRHOIDECTOMY WITH HEMORRHOID BANDING  10/19/2012   SCA THD Hem ligation/pexy  . TUBAL LIGATION    . WISDOM TOOTH EXTRACTION      Current Outpatient Medications  Medication Sig Dispense Refill  . Acetaminophen (TYLENOL PO) Take by mouth as needed.    Marland Kitchen albuterol (PROVENTIL HFA;VENTOLIN HFA) 108 (90 Base) MCG/ACT inhaler Inhale 1-2 puffs into the lungs every 6 (six) hours as needed for wheezing or shortness of breath. 1 Inhaler 0  . calcium citrate (CALCITRATE - DOSED IN MG ELEMENTAL CALCIUM) 950 MG tablet Take 200 mg of elemental calcium by mouth daily.    . Cholecalciferol (VITAMIN D3) 25 MCG (1000 UT) CHEW Chew by mouth.    . escitalopram  (LEXAPRO) 20 MG tablet Take 1 tablet by mouth daily 90 tablet 1  . Multiple Vitamins-Minerals (MULTIVITAMIN WITH MINERALS) tablet Take 1 tablet by mouth daily. Bariatric chewable     . nebivolol (BYSTOLIC) 5 MG tablet Take 1 tablet (5 mg total) by mouth daily. 90 tablet 0  . triamterene-hydrochlorothiazide (MAXZIDE-25) 37.5-25 MG tablet Take 1 tablet by mouth daily. 90 tablet 1  . UNABLE TO FIND Apple cidar gummy     No current facility-administered medications for this visit.     ALLERGIES: Naproxen  Family History  Problem Relation Age of Onset  . Hypertension Mother   . Hypothyroidism Mother   . Rectal cancer Mother 54  . Colon cancer Mother 36  . Hypotension Sister   . Hypothyroidism Sister   . Diabetes Sister   . Other Sister        pacemaker  . Seizures Brother   . Hypothyroidism Sister   . Seizures Sister   . Anemia Sister   . Seizures Paternal Grandfather   . Stroke Maternal Uncle        > 55  . Deep vein thrombosis Sister   . Diabetes Maternal Grandmother   . Kidney failure Maternal Grandmother   . Heart attack Maternal Grandfather   . Other Paternal Grandmother        tumors throughout body  . Diabetes Maternal Uncle   . Hypertension Maternal Uncle     Social History   Socioeconomic History  . Marital status: Married    Spouse name: Not on file  . Number of children: Not on file  . Years of education: Not on file  . Highest education level: Not on file  Occupational History  . Occupation: minister  Tobacco Use  . Smoking status: Former Smoker    Years: 5.00    Types: Cigarettes    Quit date: 08/05/1999    Years since quitting: 20.3  . Smokeless tobacco: Never Used  . Tobacco comment: 2-5 cigarettes per day  Substance and Sexual Activity  . Alcohol use: Yes    Alcohol/week: 5.0 - 6.0 standard drinks    Types: 5 - 6 Standard drinks or equivalent per week  . Drug use: No  . Sexual activity: Yes    Partners: Male    Birth control/protection:  Surgical    Comment: BTL, Ablation  Other Topics Concern  . Not on file  Social History Narrative  . Not on file   Social Determinants of Health   Financial Resource Strain:   . Difficulty of Paying Living Expenses:   Food Insecurity:   . Worried About Charity fundraiser in the Last Year:   . Arboriculturist in the Last Year:   Transportation Needs:   .  Lack of Transportation (Medical):   Marland Kitchen Lack of Transportation (Non-Medical):   Physical Activity:   . Days of Exercise per Week:   . Minutes of Exercise per Session:   Stress:   . Feeling of Stress :   Social Connections:   . Frequency of Communication with Friends and Family:   . Frequency of Social Gatherings with Friends and Family:   . Attends Religious Services:   . Active Member of Clubs or Organizations:   . Attends Archivist Meetings:   Marland Kitchen Marital Status:   Intimate Partner Violence:   . Fear of Current or Ex-Partner:   . Emotionally Abused:   Marland Kitchen Physically Abused:   . Sexually Abused:     Review of Systems  Constitutional: Negative.   HENT: Negative.   Eyes: Negative.   Respiratory: Negative.   Cardiovascular: Negative.   Gastrointestinal: Negative.   Genitourinary: Negative.   Musculoskeletal: Negative.   Skin: Negative.   Neurological: Negative.   Endo/Heme/Allergies: Negative.   Psychiatric/Behavioral: Negative.     PHYSICAL EXAMINATION:    There were no vitals taken for this visit.    General appearance: alert, cooperative and appears stated age Neck: no adenopathy, supple, symmetrical, trachea midline and thyroid normal to inspection and palpation Heart: regular rate and rhythm Lungs: CTAB Abdomen: soft, non-tender; bowel sounds normal; no masses,  no organomegaly Extremities: normal, atraumatic, no cyanosis Skin: normal color, texture and turgor, no rashes or lesions Lymph: normal cervical supraclavicular and inguinal nodes Neurologic: grossly normal   ASSESSMENT Postmenopausal  bleeding, h/o endometrial ablation, unable to adequately evaluate the endometrium in the office. No concerning features on ultrasound, but distorted cavity.     PLAN Ultrasound guided hysteroscopy, D&C Reviewed risks, including: bleeding, infection, uterine perforation, fluid overload, need for further sugery

## 2019-12-05 ENCOUNTER — Other Ambulatory Visit: Payer: Self-pay

## 2019-12-05 ENCOUNTER — Encounter (HOSPITAL_BASED_OUTPATIENT_CLINIC_OR_DEPARTMENT_OTHER): Payer: Self-pay | Admitting: Obstetrics and Gynecology

## 2019-12-05 NOTE — Progress Notes (Addendum)
Spoke w/ via phone for pre-op interview--- PT Lab needs dos---- Istat 8 and EKG (pre-op orders pending)              Lab results------ previous EKG 08-30-2018 epic CXR 02-22-2019 epic COVID test ------ 12-09-2019 @ 0910 Arrive at ------- 0530 NPO after ------ MN Medications to take morning of surgery ----- Lexapro, Bystolic w/ sips of water Diabetic medication ----- n/a Patient Special Instructions ----- n/a Pre-Op special Istructions -----  Sent Dr Lyndon Code inbox message in epic for pre-op orders Patient verbalized understanding of instructions that were given at this phone interview. Patient denies shortness of breath, chest pain, fever, cough a this phone interview.

## 2019-12-06 ENCOUNTER — Ambulatory Visit: Payer: Federal, State, Local not specified - PPO | Attending: Family

## 2019-12-06 DIAGNOSIS — Z23 Encounter for immunization: Secondary | ICD-10-CM

## 2019-12-06 NOTE — Progress Notes (Signed)
   Covid-19 Vaccination Clinic  Name:  Mary Sanford    MRN: [id number] DOB: March 14, 1966  12/06/2019  Ms. Mary Sanford was observed post Covid-19 immunization for 15 minutes without incident. She was provided with Vaccine Information Sheet and instruction to access the V-Safe system.   Ms. Mary Sanford was instructed to call 911 with any severe reactions post vaccine: Marland Kitchen Difficulty breathing  . Swelling of face and throat  . A fast heartbeat  . A bad rash all over body  . Dizziness and weakness   Immunizations Administered    Name Date Dose VIS Date Route   Moderna COVID-19 Vaccine 12/06/2019  3:51 PM 0.5 mL 07/2019 Intramuscular   Manufacturer: Moderna   Lot: [id number]   AdelBE:[id number]

## 2019-12-09 ENCOUNTER — Other Ambulatory Visit (HOSPITAL_COMMUNITY)
Admission: RE | Admit: 2019-12-09 | Discharge: 2019-12-09 | Disposition: A | Discharge status: Home / Self Care | Payer: Federal, State, Local not specified - PPO | Source: Ambulatory Visit | Attending: Obstetrics and Gynecology | Admitting: Obstetrics and Gynecology

## 2019-12-09 DIAGNOSIS — Z01812 Encounter for preprocedural laboratory examination: Secondary | ICD-10-CM | POA: Insufficient documentation

## 2019-12-09 DIAGNOSIS — Z20822 Contact with and (suspected) exposure to covid-19: Secondary | ICD-10-CM | POA: Diagnosis not present

## 2019-12-09 LAB — SARS CORONAVIRUS 2 (TAT 6-24 HRS): SARS Coronavirus 2: NEGATIVE

## 2019-12-12 NOTE — Anesthesia Preprocedure Evaluation (Addendum)
Anesthesia Evaluation  Patient identified by MRN, date of birth, ID band Patient awake    Reviewed: Allergy & Precautions, NPO status , Patient's Chart, lab work & pertinent test results  History of Anesthesia Complications Negative for: history of anesthetic complications  Airway Mallampati: II  TM Distance: >3 FB Neck ROM: Full    Dental  (+) Dental Advisory Given   Pulmonary former smoker,    Pulmonary exam normal        Cardiovascular hypertension, Pt. on medications and Pt. on home beta blockers Normal cardiovascular exam     Neuro/Psych PSYCHIATRIC DISORDERS Anxiety Depression negative neurological ROS     GI/Hepatic negative GI ROS, Neg liver ROS,   Endo/Other   Pre-diabetes Obesity   Renal/GU negative Renal ROS  Female GU complaint     Musculoskeletal  (+) Arthritis ,   Abdominal   Peds  Hematology negative hematology ROS (+)   Anesthesia Other Findings Covid neg 5/7  Reproductive/Obstetrics                            Anesthesia Physical Anesthesia Plan  ASA: II  Anesthesia Plan: General   Post-op Pain Management:    Induction: Intravenous  PONV Risk Score and Plan: 3 and Treatment may vary due to age or medical condition, Ondansetron, Dexamethasone and Midazolam  Airway Management Planned: LMA  Additional Equipment: None  Intra-op Plan:   Post-operative Plan: Extubation in OR  Informed Consent: I have reviewed the patients History and Physical, chart, labs and discussed the procedure including the risks, benefits and alternatives for the proposed anesthesia with the patient or authorized representative who has indicated his/her understanding and acceptance.     Dental advisory given  Plan Discussed with: CRNA and Anesthesiologist  Anesthesia Plan Comments:        Anesthesia Quick Evaluation

## 2019-12-13 ENCOUNTER — Other Ambulatory Visit: Payer: Self-pay

## 2019-12-13 ENCOUNTER — Ambulatory Visit (HOSPITAL_BASED_OUTPATIENT_CLINIC_OR_DEPARTMENT_OTHER): Payer: Federal, State, Local not specified - PPO | Admitting: Anesthesiology

## 2019-12-13 ENCOUNTER — Encounter (HOSPITAL_BASED_OUTPATIENT_CLINIC_OR_DEPARTMENT_OTHER): Payer: Self-pay | Admitting: Obstetrics and Gynecology

## 2019-12-13 ENCOUNTER — Ambulatory Visit (HOSPITAL_COMMUNITY)
Admission: RE | Admit: 2019-12-13 | Discharge: 2019-12-13 | Disposition: A | Discharge status: Home / Self Care | Payer: Federal, State, Local not specified - PPO | Source: Ambulatory Visit | Attending: Obstetrics and Gynecology | Admitting: Obstetrics and Gynecology

## 2019-12-13 ENCOUNTER — Encounter (HOSPITAL_BASED_OUTPATIENT_CLINIC_OR_DEPARTMENT_OTHER)
Admission: RE | Disposition: A | Discharge status: Home / Self Care | Payer: Self-pay | Source: Home / Self Care | Attending: Obstetrics and Gynecology

## 2019-12-13 ENCOUNTER — Ambulatory Visit (HOSPITAL_BASED_OUTPATIENT_CLINIC_OR_DEPARTMENT_OTHER)
Admission: RE | Admit: 2019-12-13 | Discharge: 2019-12-13 | Disposition: A | Discharge status: Home / Self Care | Payer: Federal, State, Local not specified - PPO | Attending: Obstetrics and Gynecology | Admitting: Obstetrics and Gynecology

## 2019-12-13 DIAGNOSIS — M1990 Unspecified osteoarthritis, unspecified site: Secondary | ICD-10-CM | POA: Diagnosis not present

## 2019-12-13 DIAGNOSIS — Z6829 Body mass index (BMI) 29.0-29.9, adult: Secondary | ICD-10-CM | POA: Diagnosis not present

## 2019-12-13 DIAGNOSIS — F418 Other specified anxiety disorders: Secondary | ICD-10-CM | POA: Diagnosis not present

## 2019-12-13 DIAGNOSIS — Z79899 Other long term (current) drug therapy: Secondary | ICD-10-CM | POA: Diagnosis not present

## 2019-12-13 DIAGNOSIS — N840 Polyp of corpus uteri: Secondary | ICD-10-CM | POA: Insufficient documentation

## 2019-12-13 DIAGNOSIS — I10 Essential (primary) hypertension: Secondary | ICD-10-CM | POA: Diagnosis not present

## 2019-12-13 DIAGNOSIS — E669 Obesity, unspecified: Secondary | ICD-10-CM | POA: Insufficient documentation

## 2019-12-13 DIAGNOSIS — Z9884 Bariatric surgery status: Secondary | ICD-10-CM | POA: Insufficient documentation

## 2019-12-13 DIAGNOSIS — D509 Iron deficiency anemia, unspecified: Secondary | ICD-10-CM | POA: Diagnosis not present

## 2019-12-13 DIAGNOSIS — R7303 Prediabetes: Secondary | ICD-10-CM | POA: Diagnosis not present

## 2019-12-13 DIAGNOSIS — Z9889 Other specified postprocedural states: Secondary | ICD-10-CM | POA: Diagnosis not present

## 2019-12-13 DIAGNOSIS — N950 Postmenopausal bleeding: Secondary | ICD-10-CM | POA: Diagnosis not present

## 2019-12-13 DIAGNOSIS — Z87891 Personal history of nicotine dependence: Secondary | ICD-10-CM | POA: Diagnosis not present

## 2019-12-13 DIAGNOSIS — E559 Vitamin D deficiency, unspecified: Secondary | ICD-10-CM | POA: Diagnosis not present

## 2019-12-13 HISTORY — DX: Postmenopausal bleeding: N95.0

## 2019-12-13 HISTORY — DX: Prediabetes: R73.03

## 2019-12-13 HISTORY — DX: Leiomyoma of uterus, unspecified: D25.9

## 2019-12-13 HISTORY — PX: OPERATIVE ULTRASOUND: SHX5996

## 2019-12-13 HISTORY — DX: Stress incontinence (female) (male): N39.3

## 2019-12-13 HISTORY — DX: Calculus of kidney: N20.0

## 2019-12-13 HISTORY — DX: Presence of spectacles and contact lenses: Z97.3

## 2019-12-13 HISTORY — PX: DILATATION & CURETTAGE/HYSTEROSCOPY WITH MYOSURE: SHX6511

## 2019-12-13 HISTORY — DX: Other seasonal allergic rhinitis: J30.2

## 2019-12-13 HISTORY — DX: Panic disorder (episodic paroxysmal anxiety): F41.0

## 2019-12-13 HISTORY — DX: Personal history of diseases of the blood and blood-forming organs and certain disorders involving the immune mechanism: Z86.2

## 2019-12-13 LAB — CBC
HCT: 40 % (ref 36.0–46.0)
Hemoglobin: 12.7 g/dL (ref 12.0–15.0)
MCH: 24.1 pg — ABNORMAL LOW (ref 26.0–34.0)
MCHC: 31.8 g/dL (ref 30.0–36.0)
MCV: 75.9 fL — ABNORMAL LOW (ref 80.0–100.0)
Platelets: 226 10*3/uL (ref 150–400)
RBC: 5.27 MIL/uL — ABNORMAL HIGH (ref 3.87–5.11)
RDW: 13.3 % (ref 11.5–15.5)
WBC: 4.2 10*3/uL (ref 4.0–10.5)
nRBC: 0 % (ref 0.0–0.2)

## 2019-12-13 LAB — COMPREHENSIVE METABOLIC PANEL
ALT: 28 U/L (ref 0–44)
AST: 24 U/L (ref 15–41)
Albumin: 3.6 g/dL (ref 3.5–5.0)
Alkaline Phosphatase: 60 U/L (ref 38–126)
Anion gap: 10 (ref 5–15)
BUN: 10 mg/dL (ref 6–20)
CO2: 25 mmol/L (ref 22–32)
Calcium: 9.2 mg/dL (ref 8.9–10.3)
Chloride: 104 mmol/L (ref 98–111)
Creatinine, Ser: 0.71 mg/dL (ref 0.44–1.00)
GFR calc Af Amer: >60 mL/min (ref >60)
GFR calc non Af Amer: >60 mL/min (ref >60)
Glucose, Bld: 107 mg/dL — ABNORMAL HIGH (ref 70–99)
Potassium: 4 mmol/L (ref 3.5–5.1)
Sodium: 139 mmol/L (ref 135–145)
Total Bilirubin: 0.6 mg/dL (ref 0.3–1.2)
Total Protein: 6.2 g/dL — ABNORMAL LOW (ref 6.5–8.1)

## 2019-12-13 SURGERY — DILATATION & CURETTAGE/HYSTEROSCOPY WITH MYOSURE
Anesthesia: General | Site: Vagina

## 2019-12-13 MED ORDER — FENTANYL CITRATE (PF) 100 MCG/2ML IJ SOLN
25.0000 ug | INTRAMUSCULAR | Status: DC | PRN
  Administered 2019-12-13: 50 ug via INTRAVENOUS
  Administered 2019-12-13: 10:00:00 25 ug via INTRAVENOUS

## 2019-12-13 MED ORDER — MIDAZOLAM HCL 5 MG/5ML IJ SOLN
INTRAMUSCULAR | Status: DC | PRN
  Administered 2019-12-13: 2 mg via INTRAVENOUS

## 2019-12-13 MED ORDER — LIDOCAINE 2% (20 MG/ML) 5 ML SYRINGE
INTRAMUSCULAR | Status: AC
  Filled 2019-12-13: qty 5

## 2019-12-13 MED ORDER — ACETAMINOPHEN 500 MG PO TABS
ORAL_TABLET | ORAL | Status: AC
  Filled 2019-12-13: qty 2

## 2019-12-13 MED ORDER — PROPOFOL 10 MG/ML IV BOLUS
INTRAVENOUS | Status: DC | PRN
  Administered 2019-12-13: 180 mg via INTRAVENOUS

## 2019-12-13 MED ORDER — LACTATED RINGERS IV SOLN: INTRAVENOUS | Status: DC

## 2019-12-13 MED ORDER — FENTANYL CITRATE (PF) 100 MCG/2ML IJ SOLN
INTRAMUSCULAR | Status: DC | PRN
  Administered 2019-12-13: 25 ug via INTRAVENOUS
  Administered 2019-12-13: 50 ug via INTRAVENOUS
  Administered 2019-12-13: 25 ug via INTRAVENOUS

## 2019-12-13 MED ORDER — OXYCODONE HCL 5 MG PO TABS
5.0000 mg | ORAL_TABLET | Freq: Once | ORAL | Status: AC | PRN
  Administered 2019-12-13: 5 mg via ORAL

## 2019-12-13 MED ORDER — ONDANSETRON HCL 4 MG/2ML IJ SOLN
INTRAMUSCULAR | Status: DC | PRN
  Administered 2019-12-13: 4 mg via INTRAVENOUS

## 2019-12-13 MED ORDER — ONDANSETRON HCL 4 MG/2ML IJ SOLN
INTRAMUSCULAR | Status: AC
  Filled 2019-12-13: qty 2

## 2019-12-13 MED ORDER — ACETAMINOPHEN 500 MG PO TABS
1000.0000 mg | ORAL_TABLET | ORAL | Status: AC
  Administered 2019-12-13: 1000 mg via ORAL

## 2019-12-13 MED ORDER — PROMETHAZINE HCL 25 MG/ML IJ SOLN: 6.2500 mg | INTRAMUSCULAR | Status: DC | PRN

## 2019-12-13 MED ORDER — KETOROLAC TROMETHAMINE 30 MG/ML IJ SOLN
INTRAMUSCULAR | Status: DC | PRN
  Administered 2019-12-13: 30 mg via INTRAVENOUS

## 2019-12-13 MED ORDER — SODIUM CHLORIDE 0.9 % IR SOLN
Status: DC | PRN
  Administered 2019-12-13: 3000 mL

## 2019-12-13 MED ORDER — OXYCODONE HCL 5 MG PO TABS
ORAL_TABLET | ORAL | Status: AC
  Filled 2019-12-13: qty 1

## 2019-12-13 MED ORDER — PROPOFOL 10 MG/ML IV BOLUS
INTRAVENOUS | Status: AC
  Filled 2019-12-13: qty 20

## 2019-12-13 MED ORDER — FENTANYL CITRATE (PF) 100 MCG/2ML IJ SOLN
INTRAMUSCULAR | Status: AC
  Filled 2019-12-13: qty 2

## 2019-12-13 MED ORDER — LIDOCAINE 2% (20 MG/ML) 5 ML SYRINGE
INTRAMUSCULAR | Status: DC | PRN
  Administered 2019-12-13: 100 mg via INTRAVENOUS

## 2019-12-13 MED ORDER — DEXAMETHASONE SODIUM PHOSPHATE 10 MG/ML IJ SOLN
INTRAMUSCULAR | Status: DC | PRN
  Administered 2019-12-13: 10 mg via INTRAVENOUS

## 2019-12-13 MED ORDER — OXYCODONE HCL 5 MG/5ML PO SOLN: 5.0000 mg | Freq: Once | ORAL | Status: AC | PRN

## 2019-12-13 MED ORDER — NEBIVOLOL HCL 5 MG PO TABS
5.0000 mg | ORAL_TABLET | Freq: Once | ORAL | Status: AC
  Administered 2019-12-13: 5 mg via ORAL
  Filled 2019-12-13: qty 1

## 2019-12-13 MED ORDER — MIDAZOLAM HCL 2 MG/2ML IJ SOLN
INTRAMUSCULAR | Status: AC
  Filled 2019-12-13: qty 2

## 2019-12-13 MED ORDER — KETOROLAC TROMETHAMINE 30 MG/ML IJ SOLN
INTRAMUSCULAR | Status: AC
  Filled 2019-12-13: qty 1

## 2019-12-13 MED ORDER — DEXAMETHASONE SODIUM PHOSPHATE 10 MG/ML IJ SOLN
INTRAMUSCULAR | Status: AC
  Filled 2019-12-13: qty 1

## 2019-12-13 SURGICAL SUPPLY — 19 items
CATH ROBINSON RED A/P 16FR (CATHETERS) ×3 IMPLANT
COVER WAND RF STERILE (DRAPES) ×3 IMPLANT
DEVICE MYOSURE LITE (MISCELLANEOUS) ×3 IMPLANT
GAUZE 4X4 16PLY RFD (DISPOSABLE) ×3 IMPLANT
GLOVE BIO SURGEON STRL SZ 6.5 (GLOVE) ×3 IMPLANT
GLOVE BIO SURGEON STRL SZ7 (GLOVE) ×3 IMPLANT
GLOVE BIOGEL PI IND STRL 7.0 (GLOVE) ×2 IMPLANT
GLOVE BIOGEL PI INDICATOR 7.0 (GLOVE) ×1
GOWN STRL REUS W/TWL LRG LVL3 (GOWN DISPOSABLE) ×6 IMPLANT
IV NS IRRIG 3000ML ARTHROMATIC (IV SOLUTION) ×3 IMPLANT
KIT PROCEDURE FLUENT (KITS) ×3 IMPLANT
KIT TURNOVER CYSTO (KITS) ×3 IMPLANT
NS IRRIG 500ML POUR BTL (IV SOLUTION) ×3 IMPLANT
PACK VAGINAL MINOR WOMEN LF (CUSTOM PROCEDURE TRAY) ×3 IMPLANT
PAD OB MATERNITY 4.3X12.25 (PERSONAL CARE ITEMS) ×3 IMPLANT
PAD PREP 24X48 CUFFED NSTRL (MISCELLANEOUS) ×3 IMPLANT
SEAL ROD LENS SCOPE MYOSURE (ABLATOR) ×3 IMPLANT
SYR BULB IRRIG 60ML STRL (SYRINGE) ×3 IMPLANT
TOWEL OR 17X26 10 PK STRL BLUE (TOWEL DISPOSABLE) ×3 IMPLANT

## 2019-12-13 NOTE — Interval H&P Note (Signed)
History and Physical Interval Note:  12/13/2019 8:14 AM  Mary Sanford  has presented today for surgery, with the diagnosis of Postmenopausal bleeding, history of endometrial ablation.  The various methods of treatment have been discussed with the patient and family. After consideration of risks, benefits and other options for treatment, the patient has consented to  Procedure(s): [place] (N/A) OPERATIVE ULTRASOUND (N/A) as a surgical intervention.  The patient's history has been reviewed, patient examined, no change in status, stable for surgery.  I have reviewed the patient's chart and labs.  Questions were answered to the patient's satisfaction.     Salvadore Dom

## 2019-12-13 NOTE — Discharge Instructions (Signed)
DISCHARGE INSTRUCTIONS: D&C / D&E The following instructions have been prepared to help you care for yourself upon your return home.   Personal hygiene: Marland Kitchen Use sanitary pads for vaginal drainage, not tampons. . Shower the day after your procedure. . NO tub baths, pools or Jacuzzis for 2-3 weeks. . Wipe front to back after using the bathroom.  Activity and limitations: . Do NOT drive or operate any equipment for 24 hours. The effects of anesthesia are still present and drowsiness may result. . Do NOT rest in bed all day. . Walking is encouraged. . Walk up and down stairs slowly. . You may resume your normal activity in one to two days or as indicated by your physician.  Sexual activity: NO intercourse for at least 2 weeks after the procedure, or as indicated by your physician.  Diet: Eat a light meal as desired this evening. You may resume your usual diet tomorrow.  Return to work: You may resume your work activities in one to two days or as indicated by your doctor.  What to expect after your surgery: Expect to have vaginal bleeding/discharge for 2-3 days and spotting for up to 10 days. It is not unusual to have soreness for up to 1-2 weeks. You may have a slight burning sensation when you urinate for the first day. Mild cramps may continue for a couple of days. You may have a regular period in 2-6 weeks.  Call your doctor for any of the following: . Excessive vaginal bleeding, saturating and changing one pad every hour. . Inability to urinate 6 hours after discharge from hospital. . Pain not relieved by pain medication. . Fever of 100.4 F or greater. . Unusual vaginal discharge or odor.   Post Anesthesia Home Care Instructions  Activity: Get plenty of rest for the remainder of the day. A responsible individual must stay with you for 24 hours following the procedure.  For the next 24 hours, DO NOT: -Drive a car -Paediatric nurse -Drink alcoholic beverages -Take any medication  unless instructed by your physician -Make any legal decisions or sign important papers.  Meals: Start with liquid foods such as gelatin or soup. Progress to regular foods as tolerated. Avoid greasy, spicy, heavy foods. If nausea and/or vomiting occur, drink only clear liquids until the nausea and/or vomiting subsides. Call your physician if vomiting continues.  Special Instructions/Symptoms: Your throat may feel dry or sore from the anesthesia or the breathing tube placed in your throat during surgery. If this causes discomfort, gargle with warm salt water. The discomfort should disappear within 24 hours.  If you had a scopolamine patch placed behind your ear for the management of post- operative nausea and/or vomiting:  1. The medication in the patch is effective for 72 hours, after which it should be removed.  Wrap patch in a tissue and discard in the trash. Wash hands thoroughly with soap and water. 2. You may remove the patch earlier than 72 hours if you experience unpleasant side effects which may include dry mouth, dizziness or visual disturbances. 3. Avoid touching the patch. Wash your hands with soap and water after contact with the patch.  No Advil, Aleve, Motrin, naproxen, or ibuprofen until after 5:00pm today if needed.

## 2019-12-13 NOTE — Op Note (Signed)
Preoperative Diagnosis: Postmenopausal bleeding, history of endometrial ablation  Postoperative Diagnosis: Same  Procedure: Hysteroscopy, dilation and curettage under ultrasound guidance  Surgeon: Dr Sumner Boast  Assistants: None  Anesthesia: General via LMA  EBL: minimal  Fluids: 500 cc  Fluid deficit: 80 cc  Urine output: not recorded  Indications for surgery: The patient is a 54 yo female with a history of an endometrial ablation, who presented with postmenopausal bleeding. Work up included an inadequate endometrial biopsy, ultrasound with an echogenic irregular endometrium, unable to pass the sonohysterogram catheter.  The risks of the surgery were reviewed with the patient and the consent form was signed prior to her surgery.  Findings: Normal sized uterus, scarred uterine cavity. No polyps. Ultrasound with overall thin endometrium, with hysteroscopy and fluid instillation, negative sonohysterogram.   Specimens: Endometrial sampling.    Procedure: The patient was taken to the operating room with an IV in place. She was placed in the dorsal lithotomy position and anesthesia was administered. She was prepped and draped in the usual sterile fashion for a vaginal procedure. Her bladder was filled with over 300 cc of NS. A weighted speculum was placed in the vagina and a single tooth tenaculum was placed on the anterior lip of the cervix. The cervix was dilated to a #7 hagar dilator with ultrasound guidance. The uterus was sounded to ~7.5 cm. The myosure hysteroscope was inserted into the uterine cavity. With continuous infusion of normal saline, the uterine cavity was visualized with the above findings. The myosure light was used to sample the lining. The myosure was then removed. The cavity was then curetted with the small sharp curette. The cavity had the characteristically gritty texture at the end of the procedure. The curette and the single tooth tenaculum were removed. The  speculum was removed. The patients perineum was cleansed of betadine and she was taken out of the dorsal lithotomy position.  Upon awakening the LMA was removed and the patient was transferred to the recovery room in stable and awake condition.  The sponge and instrument count were correct. There were no complications.

## 2019-12-13 NOTE — Anesthesia Procedure Notes (Signed)
Procedure Name: LMA Insertion Date/Time: 12/13/2019 8:46 AM Performed by: Jacai Kipp D, CRNA Pre-anesthesia Checklist: Patient identified, Emergency Drugs available, Suction available and Patient being monitored Patient Re-evaluated:Patient Re-evaluated prior to induction Oxygen Delivery Method: Circle system utilized Preoxygenation: Pre-oxygenation with 100% oxygen Induction Type: IV induction Ventilation: Mask ventilation without difficulty LMA: LMA inserted LMA Size: 4.0 Tube type: Oral Number of attempts: 1 Placement Confirmation: positive ETCO2 and breath sounds checked- equal and bilateral Tube secured with: Tape Dental Injury: Teeth and Oropharynx as per pre-operative assessment

## 2019-12-13 NOTE — Anesthesia Postprocedure Evaluation (Signed)
Anesthesia Post Note  Patient: Mary Sanford  Procedure(s) Performed: DILATATION & CURETTAGE/HYSTEROSCOPY WITH MYOSURE (N/A Vagina ) OPERATIVE ULTRASOUND (N/A Abdomen)     Patient location during evaluation: PACU Anesthesia Type: General Level of consciousness: awake and alert Pain management: pain level controlled Vital Signs Assessment: post-procedure vital signs reviewed and stable Respiratory status: spontaneous breathing, nonlabored ventilation and respiratory function stable Cardiovascular status: blood pressure returned to baseline and stable Postop Assessment: no apparent nausea or vomiting Anesthetic complications: no    Last Vitals:  Vitals:   12/13/19 1003 12/13/19 1015  BP: (!) 152/97 (!) 138/97  Pulse: 63 62  Resp: 10 15  Temp:    SpO2: 96% 94%    Last Pain:  Vitals:   12/13/19 1045  TempSrc:   PainSc: [name]

## 2019-12-13 NOTE — Transfer of Care (Signed)
Immediate Anesthesia Transfer of Care Note  Patient: Mary Sanford  Procedure(s) Performed: DILATATION & CURETTAGE/HYSTEROSCOPY WITH MYOSURE (N/A Vagina ) OPERATIVE ULTRASOUND (N/A Abdomen)  Patient Location: PACU  Anesthesia Type:General  Level of Consciousness: awake, alert  and oriented  Airway & Oxygen Therapy: Patient Spontanous Breathing and Patient connected to nasal cannula oxygen  Post-op Assessment: Report given to RN and Post -op Vital signs reviewed and stable  Post vital signs: Reviewed and stable  Last Vitals:  Vitals Value Taken Time  BP    Temp    Pulse 84 12/13/19 0918  Resp 17 12/13/19 0918  SpO2 99 % 12/13/19 0918  Vitals shown include unvalidated device data.  Last Pain:  Vitals:   12/13/19 0737  TempSrc: Oral  PainSc: 0-No pain      Patients Stated Pain Goal: 5 ([id number])  Complications: No apparent anesthesia complications

## 2019-12-14 LAB — SURGICAL PATHOLOGY

## 2019-12-22 ENCOUNTER — Telehealth: Payer: Self-pay

## 2019-12-22 DIAGNOSIS — I10 Essential (primary) hypertension: Secondary | ICD-10-CM

## 2019-12-22 MED ORDER — NEBIVOLOL HCL 5 MG PO TABS: 5.0000 mg | ORAL_TABLET | Freq: Every day | ORAL | 0 refills | Status: DC

## 2019-12-22 NOTE — Telephone Encounter (Signed)
Erx sent as requested.  

## 2019-12-22 NOTE — Telephone Encounter (Signed)
1.Medication Requested:nebivolol (BYSTOLIC) 5 MG tablet  2. Pharmacy (Name, Street, [place]): [facility] on [address]   3. On Med List: Yes   4. Last Visit with PCP: 11.12.20   5. Next visit date with PCP: N/a    Agent: Please be advised that RX refills may take up to 3 business days. We ask that you follow-up with your pharmacy.

## 2019-12-27 ENCOUNTER — Other Ambulatory Visit: Payer: Self-pay

## 2019-12-28 ENCOUNTER — Encounter: Payer: Self-pay | Admitting: Obstetrics and Gynecology

## 2019-12-28 ENCOUNTER — Ambulatory Visit (INDEPENDENT_AMBULATORY_CARE_PROVIDER_SITE_OTHER): Payer: Federal, State, Local not specified - PPO | Admitting: Obstetrics and Gynecology

## 2019-12-28 VITALS — BP 110/68 | HR 72 | Temp 97.2°F | Ht 69.0 in | Wt 203.0 lb

## 2019-12-28 DIAGNOSIS — Z9889 Other specified postprocedural states: Secondary | ICD-10-CM

## 2019-12-28 NOTE — Progress Notes (Signed)
GYNECOLOGY  VISIT   HPI: 54 y.o.   Married Black or [place] American Not Hispanic or Latino  female   [phone] with No LMP recorded. Patient has had an ablation.   here for 2 wk post opt. After a D&C.    She is s/p hysteroscopy, D&C under ultrasound guidance for postmenopausal bleeding s/p endometrial ablation. Sampling of the scarred uterine cavity returned with benign polyp.  No bleeding, no pain  GYNECOLOGIC HISTORY: No LMP recorded. Patient has had an ablation. Contraception:NA Menopausal hormone therapy: none        OB History    Gravida  7   Para  5   Term  4   Preterm  1   AB  2   Living  5     SAB  0   TAB  2   Ectopic  0   Multiple  0   Live Births  5              Patient Active Problem List   Diagnosis Date Noted  . Status post bariatric surgery 06/16/2019  . Vitamin D insufficiency 06/16/2019  . Viral URI with cough 02/22/2019  . Chronic low back pain 06/22/2018  . Severe obesity ([place]) 06/22/2018  . ASCUS pap with negative high risk HPV on 06/30/16 06/30/2016  . Depression with anxiety 12/28/2015  . Ulcer aphthous oral 11/20/2014  . Anemia, iron deficiency 09/17/2013  . Routine general medical examination at a health care facility 01/12/2013  . Allergic rhinitis 01/12/2013  . Constipation, chronic 10/04/2012  . Visit for screening mammogram 01/12/2012  . B12 deficiency anemia 01/09/2012  . Obesity (BMI 30-39.9) 01/09/2012  . Essential hypertension, benign 11/24/2011    Past Medical History:  Diagnosis Date  . Anxiety   . Arthritis    hands, left knee  . Depression   . Family history of adverse reaction to anesthesia    one sister ponv and slow to awaken  . History of iron deficiency anemia   . History of kidney stones   . Hypertension    followed by pcp  (12-05-2019 pt had ETT 12-11-2011 , in epic, showed normal with no ischemia)  . Nephrolithiasis   . Panic disorder   . PMB (postmenopausal bleeding)   . Pre-diabetes    followed  by pcp  . Seasonal allergies   . SUI (stress urinary incontinence, female)   . Uterine fibroid   . Wears glasses     Past Surgical History:  Procedure Laterality Date  . BUNIONECTOMY Bilateral   . CESAREAN SECTION  1989  . CYSTOSCOPY W/ RETROGRADES Left 06/28/2018   Procedure: CYSTOSCOPY WITH RETROGRADE PYELOGRAM LEFT STENT;  Surgeon: Ceasar Mons, MD;  Location: [facility];  Service: Urology;  Laterality: Left;  . CYSTOSCOPY/URETEROSCOPY/HOLMIUM LASER/STENT PLACEMENT Left 07/16/2018   Procedure: CYSTOSCOPY/URETEROSCOPY/HOLMIUM LASER/STENT EXCHANGE;  Surgeon: Ceasar Mons, MD;  [facility];  Service: Urology;  Laterality: Left;  . DILATATION & CURETTAGE/HYSTEROSCOPY WITH MYOSURE N/A 12/13/2019   Procedure: DILATATION & CURETTAGE/HYSTEROSCOPY WITH MYOSURE;  Surgeon: Salvadore Dom, MD;  Location[facility];  Service: Gynecology;  Laterality: N/A;  . DILITATION & CURRETTAGE/HYSTROSCOPY WITH NOVASURE ABLATION N/A 06/17/2013   Procedure: DILATATION & CURETTAGE/HYSTEROSCOPY WITH NOVASURE ABLATION;  Surgeon: Osborne Oman, MD;  Location: [facility];  Service: Gynecology;  Laterality: N/A;  . 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   Procedure: LAPAROSCOPIC ROUX-EN-Y GASTRIC BYPASS WITH UPPER ENDOSCOPY;  Surgeon: Greer Pickerel, MD;  Location:  [facility];  Service: General;  Laterality: N/A;  . HEMORRHOIDECTOMY WITH HEMORRHOID BANDING  10/19/2012   SCA THD Hem ligation/pexy  . OPERATIVE ULTRASOUND N/A 12/13/2019   Procedure: OPERATIVE ULTRASOUND;  Surgeon: Salvadore Dom, MD;  Location: [facility]  Service: Gynecology;  Laterality: N/A;  . TUBAL LIGATION Bilateral 2001  . WISDOM TOOTH EXTRACTION      Current Outpatient Medications  Medication Sig Dispense Refill  . Acetaminophen (TYLENOL PO) Take by mouth as needed.    Marland Kitchen albuterol (PROVENTIL HFA;VENTOLIN HFA) 108 (90 Base) MCG/ACT inhaler Inhale 1-2 puffs into the  lungs every 6 (six) hours as needed for wheezing or shortness of breath. (Patient taking differently: Inhale 1-2 puffs into the lungs every 6 (six) hours as needed for wheezing or shortness of breath (for seasonal allergies). ) 1 Inhaler 0  . calcium citrate (CALCITRATE - DOSED IN MG ELEMENTAL CALCIUM) 950 MG tablet Take 200 mg of elemental calcium by mouth daily.    . Cholecalciferol (VITAMIN D3) 25 MCG (1000 UT) CHEW Chew by mouth daily.     Marland Kitchen escitalopram (LEXAPRO) 20 MG tablet Take 1 tablet by mouth daily (Patient taking differently: Take 20 mg by mouth daily. ) 90 tablet 1  . Multiple Vitamins-Minerals (MULTIVITAMIN WITH MINERALS) tablet Take 1 tablet by mouth daily. Bariatric chewable     . nebivolol (BYSTOLIC) 5 MG tablet Take 1 tablet (5 mg total) by mouth daily. 90 tablet 0  . triamterene-hydrochlorothiazide (MAXZIDE-25) 37.5-25 MG tablet Take 1 tablet by mouth daily. (Patient taking differently: Take 1 tablet by mouth daily. ) 90 tablet 1  . UNABLE TO FIND Apple cidar gummy     No current facility-administered medications for this visit.     ALLERGIES: Naproxen  Family History  Problem Relation Age of Onset  . Hypertension Mother   . Hypothyroidism Mother   . Rectal cancer Mother 64  . Colon cancer Mother 37  . Hypotension Sister   . Hypothyroidism Sister   . Diabetes Sister   . Other Sister        pacemaker  . Seizures Brother   . Hypothyroidism Sister   . Seizures Sister   . Anemia Sister   . Seizures Paternal Grandfather   . Stroke Maternal Uncle        > 55  . Deep vein thrombosis Sister   . Diabetes Maternal Grandmother   . Kidney failure Maternal Grandmother   . Heart attack Maternal Grandfather   . Other Paternal Grandmother        tumors throughout body  . Diabetes Maternal Uncle   . Hypertension Maternal Uncle     Social History   Socioeconomic History  . Marital status: Married    Spouse name: Not on file  . Number of children: Not on file  . Years  of education: Not on file  . Highest education level: Not on file  Occupational History  . Occupation: minister  Tobacco Use  . Smoking status: Former Smoker    Years: 5.00    Types: Cigarettes    Quit date: 08/05/1999    Years since quitting: 20.4  . Smokeless tobacco: Never Used  Substance and Sexual Activity  . Alcohol use: Yes    Alcohol/week: 4.0 standard drinks    Types: 4 Glasses of wine per week  . Drug use: No  . Sexual activity: Yes    Partners: Male    Birth control/protection: Surgical    Comment: BTL, Ablation  Other Topics Concern  . Not on file  Social History Narrative  . Not on file   Social Determinants of Health   Financial Resource Strain:   . Difficulty of Paying Living Expenses:   Food Insecurity:   . Worried About Charity fundraiser in the Last Year:   . Arboriculturist in the Last Year:   Transportation Needs:   . Film/video editor (Medical):   Marland Kitchen Lack of Transportation (Non-Medical):   Physical Activity:   . Days of Exercise per Week:   . Minutes of Exercise per Session:   Stress:   . Feeling of Stress :   Social Connections:   . Frequency of Communication with Friends and Family:   . Frequency of Social Gatherings with Friends and Family:   . Attends Religious Services:   . Active Member of Clubs or Organizations:   . Attends Archivist Meetings:   Marland Kitchen Marital Status:   Intimate Partner Violence:   . Fear of Current or Ex-Partner:   . Emotionally Abused:   Marland Kitchen Physically Abused:   . Sexually Abused:     Review of Systems  All other systems reviewed and are negative.   PHYSICAL EXAMINATION:    BP 110/68   Pulse 72   Temp (!) 97.2 F (36.2 C)   Ht 5\' 9"  (1.753 m)   Wt 203 lb (92.1 kg)   SpO2 100%   BMI 29.98 kg/m     General appearance: alert, cooperative and appears stated age Abdomen: soft, non-tender; non distended, no masses,  no organomegaly   ASSESSMENT H/o PMP bleeding and prior endometrial  ablation Hysteroscopy, D&C under ultrasound guidance with benign polyp    PLAN F/U in 12/21 for an annual exam Call with any concerns

## 2020-01-11 ENCOUNTER — Encounter (HOSPITAL_COMMUNITY): Payer: Self-pay

## 2020-01-28 IMAGING — DX CHEST - 2 VIEW
2 series · 2 of 2 positions shown · non-contrast
Comparison: 02/20/2019

CLINICAL DATA: Cough and fever

EXAM:
CHEST - 2 VIEW

[chest pa]
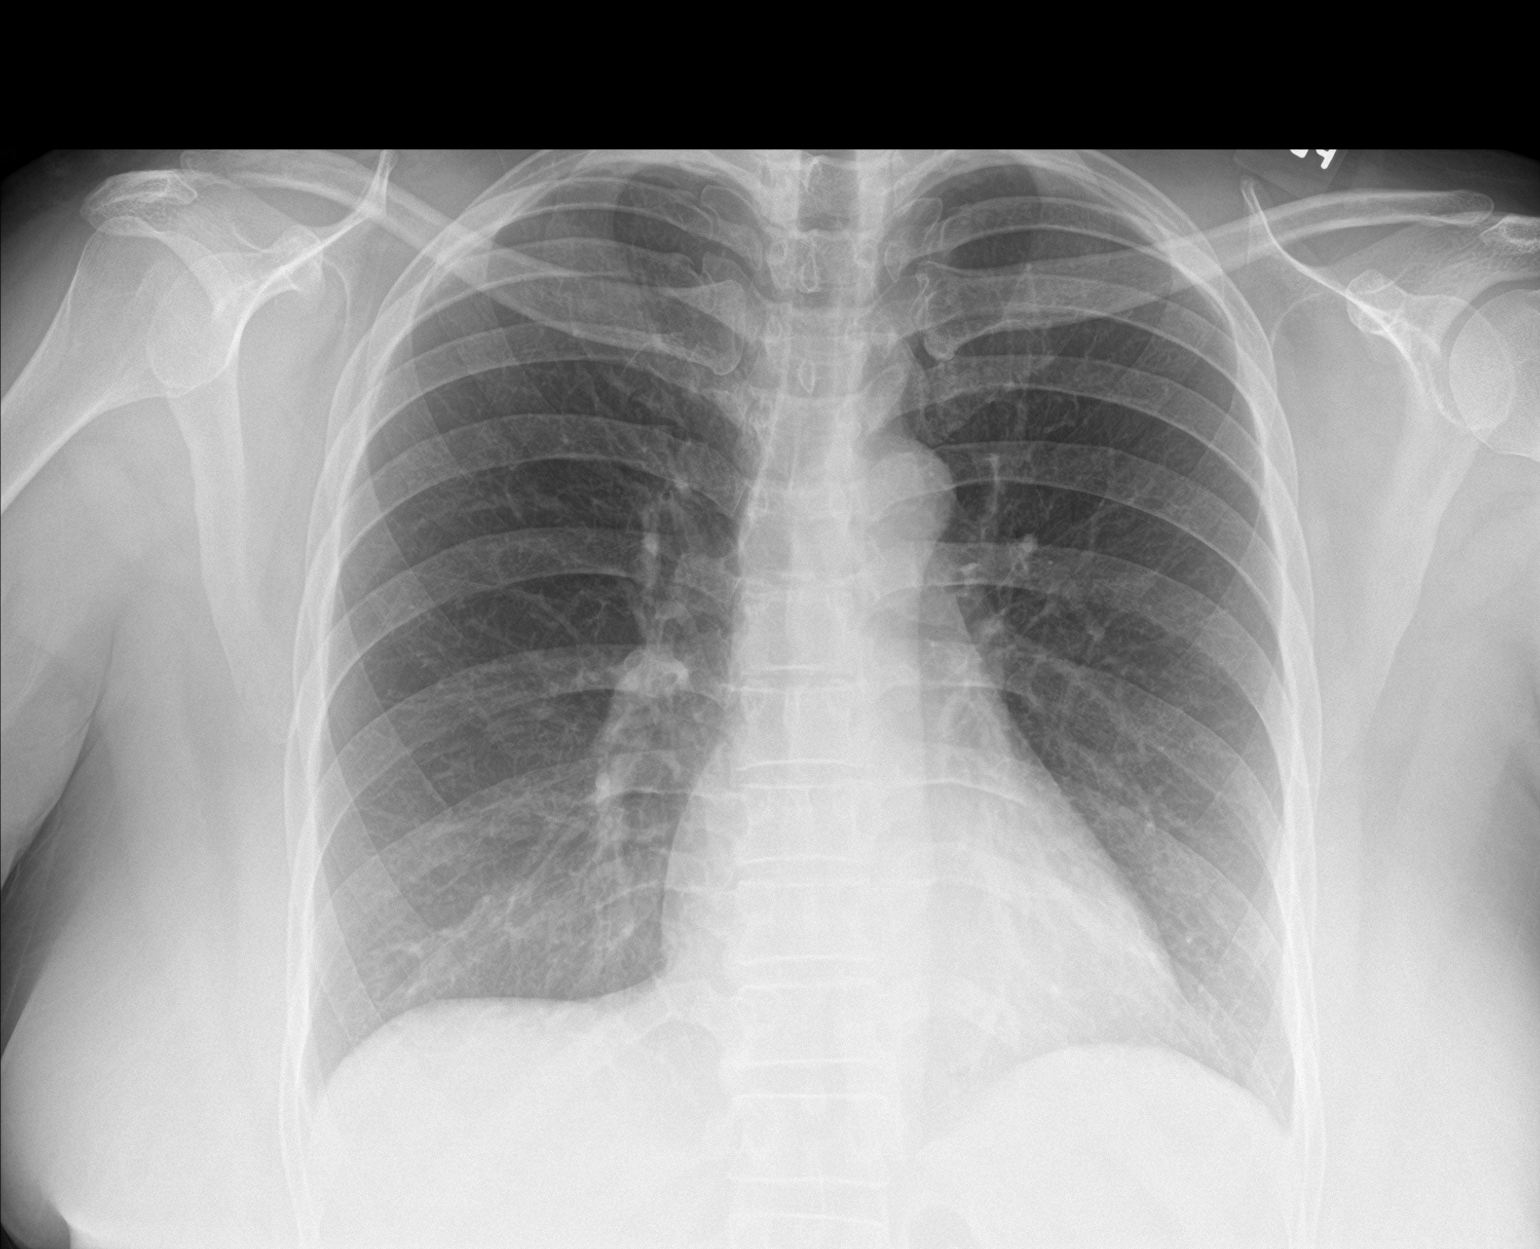

[chest lat]
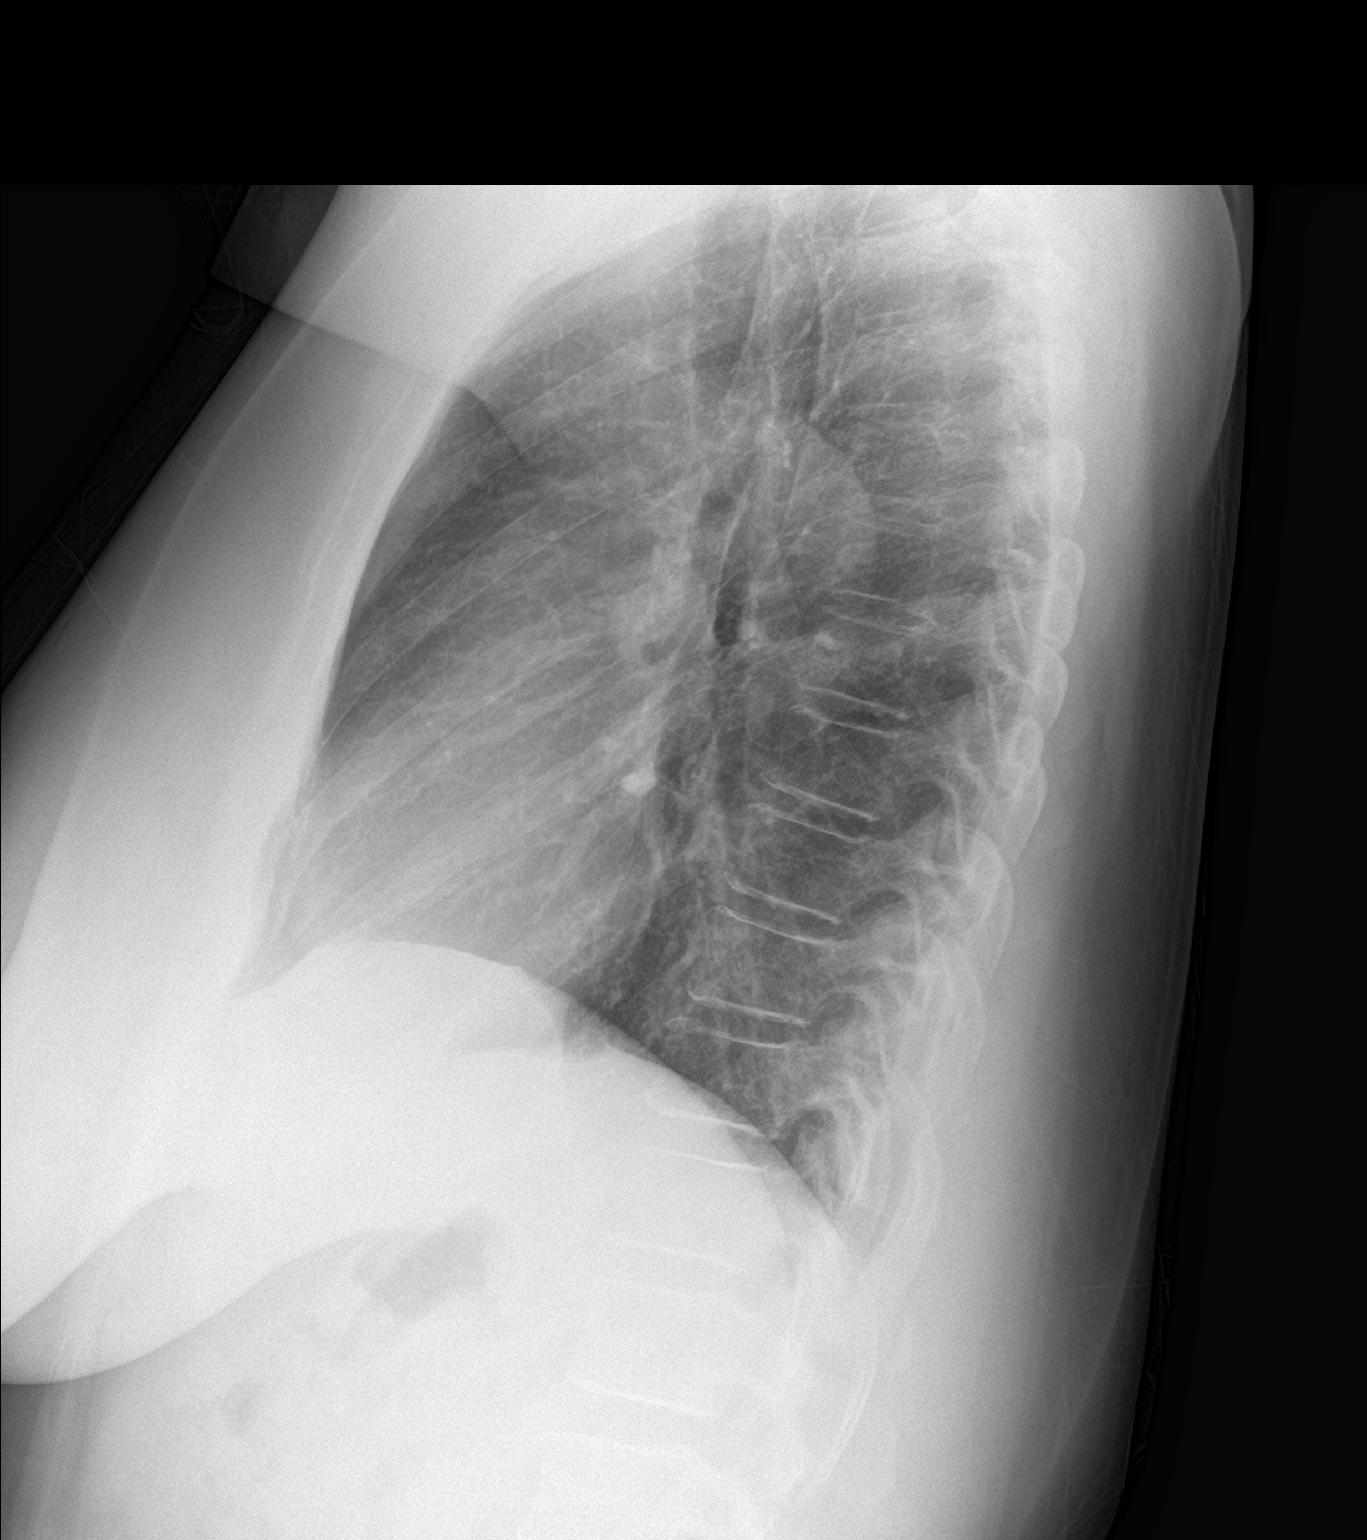

[2 of 2 positions shown; findings below may reference images not displayed]

FINDINGS: The heart size and mediastinal contours are within normal limits.
Both lungs are clear. The visualized skeletal structures are
unremarkable.
IMPRESSION: No acute abnormality of the lungs.  No airspace opacity.

## 2020-02-02 ENCOUNTER — Telehealth: Payer: Self-pay

## 2020-02-02 ENCOUNTER — Other Ambulatory Visit: Payer: Self-pay | Admitting: Internal Medicine

## 2020-02-02 ENCOUNTER — Encounter: Payer: Self-pay | Admitting: Podiatry

## 2020-02-02 ENCOUNTER — Ambulatory Visit: Payer: Federal, State, Local not specified - PPO | Admitting: Podiatry

## 2020-02-02 ENCOUNTER — Other Ambulatory Visit: Payer: Self-pay

## 2020-02-02 VITALS — Temp 97.8°F

## 2020-02-02 DIAGNOSIS — M722 Plantar fascial fibromatosis: Secondary | ICD-10-CM | POA: Diagnosis not present

## 2020-02-02 DIAGNOSIS — I10 Essential (primary) hypertension: Secondary | ICD-10-CM

## 2020-02-02 NOTE — Telephone Encounter (Signed)
DOS 02/07/2020  EPF LT - 99278 INJECTION RT HEEL - 20550  [employer] FED EFFECTIVE DATE - 08/06/2008  PLAN DEDUCTIBLE - $0.00 OUT OF POCKET - $5500.00 W/ $4426.00 REMAINING COPAY $100.00 COINSURANCE - NO COINS  NO AUTH REQUIRED

## 2020-02-03 MED ORDER — HYDROCODONE-ACETAMINOPHEN 10-325 MG PO TABS: 1.0000 | ORAL_TABLET | Freq: Four times a day (QID) | ORAL | 0 refills | Status: AC | PRN

## 2020-02-03 NOTE — Addendum Note (Signed)
Addended by: Wallene Huh on: 02/03/2020 01:07 PM   Modules accepted: Orders

## 2020-02-03 NOTE — Progress Notes (Signed)
Subjective:   Patient ID: Mary Sanford, female   DOB: 54 y.o.   MRN: [id number]   HPI Patient presents stating this left heel is killing me and it does not seem to respond to medication.  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  At this point I allowed patient to read consent form going over alternative treatments complications associated with procedure.  Patient understands surgery and is willing to take risk understands total recovery can take approximately 6 months and today air fracture walker dispensed and understands she will be in this full-time 3 to 4 weeks with usage of 6 to 8 weeks and possibility for arch pain other pathology associated with endoscopic release.  Patient is willing to accept this signed consent form is scheduled for outpatient surgery is encouraged to call with questions if any should arise prior to procedure

## 2020-02-07 ENCOUNTER — Encounter: Payer: Self-pay | Admitting: Podiatry

## 2020-02-07 DIAGNOSIS — M722 Plantar fascial fibromatosis: Secondary | ICD-10-CM | POA: Diagnosis not present

## 2020-02-07 DIAGNOSIS — I10 Essential (primary) hypertension: Secondary | ICD-10-CM | POA: Diagnosis not present

## 2020-02-16 ENCOUNTER — Encounter: Payer: Self-pay | Admitting: Podiatry

## 2020-02-16 ENCOUNTER — Ambulatory Visit (INDEPENDENT_AMBULATORY_CARE_PROVIDER_SITE_OTHER): Payer: Federal, State, Local not specified - PPO | Admitting: Podiatry

## 2020-02-16 ENCOUNTER — Other Ambulatory Visit: Payer: Self-pay

## 2020-02-16 DIAGNOSIS — M722 Plantar fascial fibromatosis: Secondary | ICD-10-CM | POA: Diagnosis not present

## 2020-02-17 NOTE — Progress Notes (Signed)
Subjective:   Patient ID: [name], female   DOB: 54 y.o.   MRN: [id number]   HPI Patient presents stating she is doing great with minimal discomfort   ROS      Objective:  Physical Exam  Neurovascular status intact negative [name]' sign noted with patient's left heel healing well wound edges well coapted and minimal plantar discomfort     Assessment:  Doing well post endoscopic surgery left     Plan:  Reapplied sterile dressing continue immobilization continue elevation compression and reappoint 2 weeks suture removal or earlier if needed

## 2020-03-01 ENCOUNTER — Encounter: Payer: Self-pay | Admitting: Podiatry

## 2020-03-01 ENCOUNTER — Other Ambulatory Visit: Payer: Self-pay

## 2020-03-01 ENCOUNTER — Ambulatory Visit (INDEPENDENT_AMBULATORY_CARE_PROVIDER_SITE_OTHER): Payer: Federal, State, Local not specified - PPO | Admitting: Podiatry

## 2020-03-01 DIAGNOSIS — M722 Plantar fascial fibromatosis: Secondary | ICD-10-CM | POA: Diagnosis not present

## 2020-03-01 NOTE — Progress Notes (Signed)
Subjective:   Patient ID: Mary Sanford, female   DOB: 54 y.o.   MRN: [id number]   HPI Patient states doing well but the boot is heavy and she wants something that can help to stretch her foot during her continued healing process   ROS      Objective:  Physical Exam  Neurovascular status intact negative [name]' sign noted stitches intact left foot with mild to moderate discomfort of the arch itself     Assessment:  Fasciitis doing well after endoscopic surgery with stitches intact     Plan:  Stitches removed wound edges coapted well sterile dressings applied.  I discussed continued stretching immobilization and she states the boot is hard for her and I dispensed night splint that I want her to use when sitting or laying down.  Encouraged her to gradually return to soft shoe over the next couple weeks and the total recovery will take several more months and I encouraged him to call with questions concerns which may arise

## 2020-03-12 ENCOUNTER — Telehealth: Payer: Self-pay

## 2020-03-12 DIAGNOSIS — I10 Essential (primary) hypertension: Secondary | ICD-10-CM

## 2020-03-12 NOTE — Telephone Encounter (Signed)
New message    Patient calling the office for samples of medication:   1.  What medication and dosage are you requesting samples for? nebivolol (BYSTOLIC) 5 MG tablet  2.  Are you currently out of this medication? Yes

## 2020-03-14 MED ORDER — NEBIVOLOL HCL 5 MG PO TABS: 5.0000 mg | ORAL_TABLET | Freq: Every day | ORAL | 0 refills | Status: DC

## 2020-04-17 ENCOUNTER — Other Ambulatory Visit: Payer: Self-pay | Admitting: Internal Medicine

## 2020-04-17 DIAGNOSIS — F418 Other specified anxiety disorders: Secondary | ICD-10-CM

## 2020-05-04 ENCOUNTER — Encounter: Payer: Self-pay | Admitting: Family

## 2020-05-04 ENCOUNTER — Telehealth (INDEPENDENT_AMBULATORY_CARE_PROVIDER_SITE_OTHER): Payer: Federal, State, Local not specified - PPO | Admitting: Family

## 2020-05-04 DIAGNOSIS — J069 Acute upper respiratory infection, unspecified: Secondary | ICD-10-CM | POA: Diagnosis not present

## 2020-05-04 MED ORDER — PREDNISONE 20 MG PO TABS
40.0000 mg | ORAL_TABLET | Freq: Every day | ORAL | 0 refills | Status: DC
Start: 2020-05-04 — End: 2020-06-14

## 2020-05-04 MED ORDER — BENZONATATE 200 MG PO CAPS
200.0000 mg | ORAL_CAPSULE | Freq: Three times a day (TID) | ORAL | 0 refills | Status: DC | PRN
Start: 2020-05-04 — End: 2020-06-14

## 2020-05-04 MED ORDER — DOXYCYCLINE HYCLATE 100 MG PO TABS
100.0000 mg | ORAL_TABLET | Freq: Two times a day (BID) | ORAL | 0 refills | Status: DC
Start: 2020-05-04 — End: 2020-06-14

## 2020-05-04 NOTE — Progress Notes (Signed)
Mary Sanford is a 54 y.o. female with the following history as recorded in EpicCare:  Patient Active Problem List   Diagnosis Date Noted  . Status post bariatric surgery 06/16/2019  . Vitamin D insufficiency 06/16/2019  . Viral URI with cough 02/22/2019  . Chronic low back pain 06/22/2018  . Severe obesity ([place]) 06/22/2018  . ASCUS pap with negative high risk HPV on 06/30/16 06/30/2016  . Depression with anxiety 12/28/2015  . Ulcer aphthous oral 11/20/2014  . Anemia, iron deficiency 09/17/2013  . Routine general medical examination at a health care facility 01/12/2013  . Allergic rhinitis 01/12/2013  . Constipation, chronic 10/04/2012  . Visit for screening mammogram 01/12/2012  . B12 deficiency anemia 01/09/2012  . Obesity (BMI 30-39.9) 01/09/2012  . Essential hypertension, benign 11/24/2011    Current Outpatient Medications  Medication Sig Dispense Refill  . Acetaminophen (TYLENOL PO) Take by mouth as needed.    Marland Kitchen albuterol (PROVENTIL HFA;VENTOLIN HFA) 108 (90 Base) MCG/ACT inhaler Inhale 1-2 puffs into the lungs every 6 (six) hours as needed for wheezing or shortness of breath. (Patient taking differently: Inhale 1-2 puffs into the lungs every 6 (six) hours as needed for wheezing or shortness of breath (for seasonal allergies). ) 1 Inhaler 0  . benzonatate (TESSALON) 200 MG capsule Take 1 capsule (200 mg total) by mouth 3 (three) times daily as needed. 30 capsule 0  . calcium citrate (CALCITRATE - DOSED IN MG ELEMENTAL CALCIUM) 950 MG tablet Take 200 mg of elemental calcium by mouth daily.    . Cholecalciferol (VITAMIN D3) 25 MCG (1000 UT) CHEW Chew by mouth daily.     Marland Kitchen doxycycline (VIBRA-TABS) 100 MG tablet Take 1 tablet (100 mg total) by mouth 2 (two) times daily. 20 tablet 0  . escitalopram (LEXAPRO) 20 MG tablet Take 1 tablet (20 mg total) by mouth daily. 90 tablet 1  . Multiple Vitamins-Minerals (MULTIVITAMIN WITH MINERALS) tablet Take 1 tablet by mouth daily. Bariatric  chewable     . nebivolol (BYSTOLIC) 5 MG tablet Take 1 tablet (5 mg total) by mouth daily. 84 tablet 0  . predniSONE (DELTASONE) 20 MG tablet Take 2 tablets (40 mg total) by mouth daily with breakfast. 10 tablet 0  . triamterene-hydrochlorothiazide (MAXZIDE-25) 37.5-25 MG tablet Take 1 tablet by mouth once daily 90 tablet 0  . UNABLE TO FIND Apple cidar gummy     No current facility-administered medications for this visit.    Allergies: Naproxen  Past Medical History:  Diagnosis Date  . Anxiety   . Arthritis    hands, left knee  . Depression   . Family history of adverse reaction to anesthesia    one sister ponv and slow to awaken  . History of iron deficiency anemia   . History of kidney stones   . Hypertension    followed by pcp  (12-05-2019 pt had ETT 12-11-2011 , in epic, showed normal with no ischemia)  . Nephrolithiasis   . Panic disorder   . PMB (postmenopausal bleeding)   . Pre-diabetes    followed by pcp  . Seasonal allergies   . SUI (stress urinary incontinence, female)   . Uterine fibroid   . Wears glasses     Past Surgical History:  Procedure Laterality Date  . BUNIONECTOMY Bilateral   . CESAREAN SECTION  1989  . CYSTOSCOPY W/ RETROGRADES Left 06/28/2018   Procedure: CYSTOSCOPY WITH RETROGRADE PYELOGRAM LEFT STENT;  Surgeon: Ceasar Mons, MD;  Location: [facility];  Service: Urology;  Laterality: Left;  . CYSTOSCOPY/URETEROSCOPY/HOLMIUM LASER/STENT PLACEMENT Left 07/16/2018   Procedure: CYSTOSCOPY/URETEROSCOPY/HOLMIUM LASER/STENT EXCHANGE;  Surgeon: Ceasar Mons, MD;  Location: [facility];  Service: Urology;  Laterality: Left;  . DILATATION & CURETTAGE/HYSTEROSCOPY WITH MYOSURE N/A 12/13/2019   Procedure: DILATATION & CURETTAGE/HYSTEROSCOPY WITH MYOSURE;  Surgeon: Salvadore Dom, MD;  Location[facility];  Service: Gynecology;  Laterality: N/A;  . DILITATION & CURRETTAGE/HYSTROSCOPY WITH NOVASURE ABLATION  N/A 06/17/2013   Procedure: DILATATION & CURETTAGE/HYSTEROSCOPY WITH NOVASURE ABLATION;  Surgeon: Osborne Oman, MD;  Location: [facility];  Service: Gynecology;  Laterality: N/A;  . 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 HEMORRHOIDECTOMY WITH HEMORRHOID BANDING  10/19/2012   [facility] [place] Hem ligation/pexy  . OPERATIVE ULTRASOUND N/A 12/13/2019   Procedure: OPERATIVE ULTRASOUND;  Surgeon: Salvadore Dom, MD;  Location: [facility];  Service: Gynecology;  Laterality: N/A;  . TUBAL LIGATION Bilateral 2001  . WISDOM TOOTH EXTRACTION      Family History  Problem Relation Age of Onset  . Hypertension Mother   . Hypothyroidism Mother   . Rectal cancer Mother 22  . Colon cancer Mother 38  . Hypotension Sister   . Hypothyroidism Sister   . Diabetes Sister   . Other Sister        pacemaker  . Seizures Brother   . Hypothyroidism Sister   . Seizures Sister   . Anemia Sister   . Seizures Paternal Grandfather   . Stroke Maternal Uncle        > 55  . Deep vein thrombosis Sister   . Diabetes Maternal Grandmother   . Kidney failure Maternal Grandmother   . Heart attack Maternal Grandfather   . Other Paternal Grandmother        tumors throughout body  . Diabetes Maternal Uncle   . Hypertension Maternal Uncle     Social History   Tobacco Use  . Smoking status: Former Smoker    Years: 5.00    Types: Cigarettes    Quit date: 08/05/1999    Years since quitting: 20.7  . Smokeless tobacco: Never Used  Substance Use Topics  . Alcohol use: Yes    Alcohol/week: 4.0 standard drinks    Types: 4 Glasses of wine per week    Subjective:   I connected with [name] on 05/04/20 at  9:20 AM EDT by a telephone call and verified that I am speaking with the correct person using two identifiers.   I discussed the limitations of  evaluation and management by telemedicine and the availability of in person appointments. The patient expressed understanding and agreed to proceed. Provider in office/ patient is at home; provider and patient are only 2 people on video call.   1 week history of cough/ congestion; "can't shake the cough." Is concerned that she may have pneumonia; is fully vaccinated- no known COVID exposure; does have albuterol inhaler and has been using with limited benefit;    Objective:  There were no vitals filed for this visit.  Lungs: Respirations unlabored; clear to auscultation bilaterally without wheeze, rales, rhonchi  Neurologic: Alert and oriented; speech intact; face symmetrical; moves all extremities well; CNII-XII intact without focal deficit   Assessment:  1. Viral URI with cough     Plan:  Rx for Doxycycline 100 mg bid  x 10 days, Prednisone 40 mg qd x 5 days, Tessalon Perles; she will come get COVID test today- if negative and cough persists, can schedule her for CXR;  Time spent 15 minutes  No follow-ups on file.  Orders Placed This Encounter  Procedures  . Novel Coronavirus, NAA ([facility])    Order Specific Question:   Is this test for diagnosis or screening    Answer:   Diagnosis of ill patient    Order Specific Question:   Symptomatic for COVID-19 as defined by CDC    Answer:   Yes    Order Specific Question:   Date of Symptom Onset    Answer:   04/30/2020    Order Specific Question:   Hospitalized for COVID-19    Answer:   No    Order Specific Question:   Admitted to ICU for COVID-19    Answer:   No    Order Specific Question:   Previously tested for COVID-19    Answer:   Yes    Order Specific Question:   Resident in a congregate (group) care setting    Answer:   No    Order Specific Question:   Is the patient student?    Answer:   No    Order Specific Question:   Employed in healthcare setting    Answer:   No    Order Specific Question:   Pregnant    Answer:   No     Order Specific Question:   Has patient completed COVID vaccination(s) (2 doses of Pfizer/Moderna 1 dose of [employer])    Answer:   Yes    Requested Prescriptions   Signed Prescriptions Disp Refills  . doxycycline (VIBRA-TABS) 100 MG tablet 20 tablet 0    Sig: Take 1 tablet (100 mg total) by mouth 2 (two) times daily.  . predniSONE (DELTASONE) 20 MG tablet 10 tablet 0    Sig: Take 2 tablets (40 mg total) by mouth daily with breakfast.  . benzonatate (TESSALON) 200 MG capsule 30 capsule 0    Sig: Take 1 capsule (200 mg total) by mouth 3 (three) times daily as needed.

## 2020-05-06 LAB — SARS-COV-2, NAA 2 DAY TAT

## 2020-05-06 LAB — SPECIMEN STATUS REPORT

## 2020-05-06 LAB — NOVEL CORONAVIRUS, NAA: SARS-CoV-2, NAA: NOT DETECTED

## 2020-05-07 ENCOUNTER — Other Ambulatory Visit: Payer: Self-pay | Admitting: Family

## 2020-05-10 ENCOUNTER — Ambulatory Visit: Payer: Federal, State, Local not specified - PPO | Admitting: Internal Medicine

## 2020-05-25 ENCOUNTER — Other Ambulatory Visit: Payer: Self-pay | Admitting: Internal Medicine

## 2020-05-25 DIAGNOSIS — I10 Essential (primary) hypertension: Secondary | ICD-10-CM

## 2020-05-28 ENCOUNTER — Encounter: Payer: Self-pay | Admitting: Internal Medicine

## 2020-05-28 ENCOUNTER — Other Ambulatory Visit: Payer: Self-pay | Admitting: Internal Medicine

## 2020-05-28 DIAGNOSIS — B351 Tinea unguium: Secondary | ICD-10-CM

## 2020-05-28 MED ORDER — TERBINAFINE HCL 250 MG PO TABS: 250.0000 mg | ORAL_TABLET | Freq: Every day | ORAL | 0 refills | Status: DC

## 2020-06-08 ENCOUNTER — Ambulatory Visit (INDEPENDENT_AMBULATORY_CARE_PROVIDER_SITE_OTHER): Payer: Federal, State, Local not specified - PPO | Admitting: Family Medicine

## 2020-06-08 ENCOUNTER — Ambulatory Visit (INDEPENDENT_AMBULATORY_CARE_PROVIDER_SITE_OTHER): Payer: Federal, State, Local not specified - PPO

## 2020-06-08 ENCOUNTER — Ambulatory Visit: Payer: Self-pay

## 2020-06-08 ENCOUNTER — Other Ambulatory Visit: Payer: Self-pay

## 2020-06-08 VITALS — BP 114/66 | HR 76 | Ht 69.0 in | Wt 206.4 lb

## 2020-06-08 DIAGNOSIS — M79645 Pain in left finger(s): Secondary | ICD-10-CM | POA: Diagnosis not present

## 2020-06-08 DIAGNOSIS — G8929 Other chronic pain: Secondary | ICD-10-CM

## 2020-06-08 DIAGNOSIS — M19042 Primary osteoarthritis, left hand: Secondary | ICD-10-CM | POA: Diagnosis not present

## 2020-06-08 DIAGNOSIS — R768 Other specified abnormal immunological findings in serum: Secondary | ICD-10-CM | POA: Diagnosis not present

## 2020-06-08 LAB — SEDIMENTATION RATE: Sed Rate: 7 mm/hr (ref 0–30)

## 2020-06-08 LAB — URIC ACID: Uric Acid, Serum: 4.8 mg/dL (ref 2.4–7.0)

## 2020-06-08 MED ORDER — TRAMADOL HCL 50 MG PO TABS: 50.0000 mg | ORAL_TABLET | Freq: Three times a day (TID) | ORAL | 0 refills | Status: DC | PRN

## 2020-06-08 NOTE — Progress Notes (Signed)
I, Mary Sanford, LAT, ATC, am serving as scribe for Dr. Lynne Leader.  Mary Sanford is a 54 y.o. female who presents to [facility] today for f/u of L thumb/hand pain.  She was last seen by Dr. Georgina Snell on 07/14/19 and had 1st CMC injection and was advised to use a thumb spica splint prn.  She was also advised to use Voltaren gel and was prescribed hydrocodone to use sparingly for severe pain.  Since her last visit, pt reports pn is terrible 11/10. Pn on palmar side of wrist (1st carpometcarpal joint). Pn is effecting her grip strength hand weakness. No numbness. Pt describes pn as constant and achy. No relief c Tylenol.  Pt is not wearing splint, but does note that it was helping.   Pertinent review of systems: No fevers or chills  Relevant historical information: History of B12 and vitamin D deficiency.  Status post bariatric surgery.   Exam:  BP 114/66 (BP Location: Right Arm, Patient Position: Sitting, Cuff Size: Normal)   Pulse 76   Ht 5\' 9"  (1.753 m)   Wt 206 lb 6.4 oz (93.6 kg)   SpO2 96%   BMI 30.48 kg/m  General: Well Developed, well nourished, and in no acute distress.   MSK: Left thumb normal-appearing Tender palpation volar dorsal first CMC.  Decreased thumb motion.  Wrist nontender.  Pulses cap refill and sensation intact distally.    Lab and Radiology Results Results for orders placed or performed in visit on 06/08/20 (from the past 72 hour(s))  Sedimentation rate     Status: None   Collection Time: 06/08/20 11:15 AM  Result Value Ref Range   Sed Rate 7 0 - 30 mm/hr  Uric acid     Status: None   Collection Time: 06/08/20 11:15 AM  Result Value Ref Range   Uric Acid, Serum 4.8 2.4 - 7.0 mg/dL   X-ray images left hand obtained today personally and independently interpreted. No fractures.  Mild DJD first [place].  Calcific change distal to scaphoid and wrist.  . Await formal radiology review  Procedure: Real-time Ultrasound Guided  Injection of left first [facility] Device: Philips Affiniti 50G Images permanently stored and available for review in PACS Verbal informed consent obtained.  Discussed risks and benefits of procedure. Warned about infection bleeding damage to structures skin hypopigmentation and fat atrophy among others. Patient expresses understanding and agreement Time-out conducted.   Noted no overlying erythema, induration, or other signs of local infection.   Skin prepped in a sterile fashion.   Local anesthesia: Topical Ethyl chloride.   With sterile technique and under real time ultrasound guidance:  40 mg of Kenalog and 1 mL of lidocaine injected into CMC. Fluid seen entering the joint capsule.   Completed without difficulty   Pain minimally resolved .   Advised to call if fevers/chills, erythema, induration, drainage, or persistent bleeding.   Images permanently stored and available for review in the ultrasound unit.  Impression: Technically successful ultrasound guided injection.     Assessment and Plan: 54 y.o. female with left thumb pain.  This is an exacerbation of an ongoing chronic problem.  She had great response to steroid injection at first [facility] December 2020.  Plan today for repeat injection.  She did not have immediate response to injection indicating that the pain may not be in the [facility].  X-ray of the hand obtained following the visit does show some calcific change 1 joint more proximal  than the [facility] at the scaphoid which may be the source of pain.  Plan for rheumatology work-up listed below.  So far labs back include sed rate and uric acid are both normal.  Plan for injection and thumb spica splint.  If not improving would proceed with further work-up and evaluation including possibly MRI. Limited tramadol for pain control in the short-term.  PDMP reviewed during this encounter. Orders Placed This Encounter  Procedures  . [place] LIMITED JOINT SPACE STRUCTURES UP LEFT(NO LINKED CHARGES)    Order  Specific Question:   Reason for Exam (SYMPTOM  OR DIAGNOSIS REQUIRED)    Answer:   L thumb pain    Order Specific Question:   Preferred imaging location?    Answer:   [place]  . DG Hand Complete Left    Standing Status:   Future    Number of Occurrences:   1    Standing Expiration Date:   06/08/2021    Order Specific Question:   Reason for Exam (SYMPTOM  OR DIAGNOSIS REQUIRED)    Answer:   pain 1r [place]    Order Specific Question:   Is patient pregnant?    Answer:   No    Order Specific Question:   Preferred imaging location?    Answer:   [facility]  . ANA    Standing Status:   Future    Number of Occurrences:   1    Standing Expiration Date:   06/08/2021  . Cyclic citrul peptide antibody, IgG    Standing Status:   Future    Number of Occurrences:   1    Standing Expiration Date:   06/08/2021  . Uric acid    Standing Status:   Future    Number of Occurrences:   1    Standing Expiration Date:   06/08/2021  . Sedimentation rate    Standing Status:   Future    Number of Occurrences:   1    Standing Expiration Date:   06/08/2021  . Rheumatoid factor    Standing Status:   Future    Number of Occurrences:   1    Standing Expiration Date:   06/08/2021   Meds ordered this encounter  Medications  . traMADol (ULTRAM) 50 MG tablet    Sig: Take 1 tablet (50 mg total) by mouth every 8 (eight) hours as needed for severe pain.    Dispense:  15 tablet    Refill:  0     Discussed warning signs or symptoms. Please see discharge instructions. Patient expresses understanding.   The above documentation has been reviewed and is accurate and complete Lynne Leader, M.D.

## 2020-06-08 NOTE — Patient Instructions (Addendum)
You had an injection today.  Call or go to the ER if you develop a large red swollen joint with extreme pain or oozing puss.   Please get an Xray today before you leave  Please get labs today before you leave  Please go to [facility] supply to get the short thumb spica splint we talked about today. You may also be able to get it from [employer].   Plan for tramadol as needed.   Let me know if this does not work.

## 2020-06-11 ENCOUNTER — Encounter: Payer: Self-pay | Admitting: Family Medicine

## 2020-06-11 NOTE — Progress Notes (Signed)
Uric acid test for gout is normal.  Sedimentation rate a marker of general inflammation is normal.  Other labs are still pending however.

## 2020-06-11 NOTE — Progress Notes (Signed)
X-ray left hand shows arthritis at the base of the thumb.  It also shows what looks to be an old injury or arthritis further in the wrist.

## 2020-06-12 LAB — ANTI-NUCLEAR AB-TITER (ANA TITER): ANA Titer 1: 1:80 {titer} — ABNORMAL HIGH

## 2020-06-12 LAB — RHEUMATOID FACTOR: Rheumatoid fact SerPl-aCnc: <14 IU/mL (ref <14)

## 2020-06-12 LAB — CYCLIC CITRUL PEPTIDE ANTIBODY, IGG: Cyclic Citrullin Peptide Ab: <16 UNITS

## 2020-06-12 LAB — ANA: Anti Nuclear Antibody (ANA): POSITIVE — AB

## 2020-06-13 ENCOUNTER — Ambulatory Visit: Payer: Federal, State, Local not specified - PPO | Admitting: Internal Medicine

## 2020-06-13 NOTE — Progress Notes (Signed)
The labs is abnormal. ANA which is a lab for lupus is a bit elevated. It is more likely at this point that you do not have lupus but this test is positive enough that I think it is worthwhile having a follow-up appointment with a rheumatologist. I will refer to rheumatology and you should hear soon about scheduling an appointment.

## 2020-06-13 NOTE — Addendum Note (Signed)
Addended by: Gregor Hams on: 06/13/2020 12:42 PM   Modules accepted: Orders

## 2020-06-14 ENCOUNTER — Ambulatory Visit: Payer: Federal, State, Local not specified - PPO | Admitting: Internal Medicine

## 2020-06-14 ENCOUNTER — Other Ambulatory Visit: Payer: Self-pay

## 2020-06-14 ENCOUNTER — Encounter: Payer: Self-pay | Admitting: Internal Medicine

## 2020-06-14 ENCOUNTER — Ambulatory Visit (INDEPENDENT_AMBULATORY_CARE_PROVIDER_SITE_OTHER): Payer: Federal, State, Local not specified - PPO

## 2020-06-14 VITALS — BP 120/78 | HR 66 | Temp 98.0°F | Resp 16 | Ht 69.0 in | Wt 202.0 lb

## 2020-06-14 DIAGNOSIS — R7303 Prediabetes: Secondary | ICD-10-CM | POA: Diagnosis not present

## 2020-06-14 DIAGNOSIS — J069 Acute upper respiratory infection, unspecified: Secondary | ICD-10-CM

## 2020-06-14 DIAGNOSIS — R059 Cough, unspecified: Secondary | ICD-10-CM | POA: Diagnosis not present

## 2020-06-14 DIAGNOSIS — E559 Vitamin D deficiency, unspecified: Secondary | ICD-10-CM | POA: Diagnosis not present

## 2020-06-14 DIAGNOSIS — E663 Overweight: Secondary | ICD-10-CM

## 2020-06-14 DIAGNOSIS — Z720 Tobacco use: Secondary | ICD-10-CM

## 2020-06-14 DIAGNOSIS — D508 Other iron deficiency anemias: Secondary | ICD-10-CM

## 2020-06-14 DIAGNOSIS — I10 Essential (primary) hypertension: Secondary | ICD-10-CM

## 2020-06-14 DIAGNOSIS — J4530 Mild persistent asthma, uncomplicated: Secondary | ICD-10-CM

## 2020-06-14 DIAGNOSIS — E669 Obesity, unspecified: Secondary | ICD-10-CM

## 2020-06-14 DIAGNOSIS — D510 Vitamin B12 deficiency anemia due to intrinsic factor deficiency: Secondary | ICD-10-CM | POA: Diagnosis not present

## 2020-06-14 DIAGNOSIS — Z23 Encounter for immunization: Secondary | ICD-10-CM

## 2020-06-14 DIAGNOSIS — Z0000 Encounter for general adult medical examination without abnormal findings: Secondary | ICD-10-CM | POA: Diagnosis not present

## 2020-06-14 LAB — BASIC METABOLIC PANEL
BUN: 18 mg/dL (ref 6–23)
CO2: 31 mEq/L (ref 19–32)
Calcium: 9.6 mg/dL (ref 8.4–10.5)
Chloride: 101 mEq/L (ref 96–112)
Creatinine, Ser: 0.77 mg/dL (ref 0.40–1.20)
GFR: 88.27 mL/min (ref >60.00)
Glucose, Bld: 95 mg/dL (ref 70–99)
Potassium: 4.3 mEq/L (ref 3.5–5.1)
Sodium: 137 mEq/L (ref 135–145)

## 2020-06-14 LAB — LIPID PANEL
Cholesterol: 120 mg/dL (ref 0–200)
HDL: 67.7 mg/dL (ref >39.00)
LDL Cholesterol: 36 mg/dL (ref 0–99)
NonHDL: 52.41
Total CHOL/HDL Ratio: 2
Triglycerides: 84 mg/dL (ref 0.0–149.0)
VLDL: 16.8 mg/dL (ref 0.0–40.0)

## 2020-06-14 LAB — CBC WITH DIFFERENTIAL/PLATELET
Basophils Absolute: 0.1 10*3/uL (ref 0.0–0.1)
Basophils Relative: 1.4 % (ref 0.0–3.0)
Eosinophils Absolute: 0.1 10*3/uL (ref 0.0–0.7)
Eosinophils Relative: 1.9 % (ref 0.0–5.0)
HCT: 42.9 % (ref 36.0–46.0)
Hemoglobin: 13.6 g/dL (ref 12.0–15.0)
Lymphocytes Relative: 39.4 % (ref 12.0–46.0)
Lymphs Abs: 2.3 10*3/uL (ref 0.7–4.0)
MCHC: 31.7 g/dL (ref 30.0–36.0)
MCV: 73.8 fl — ABNORMAL LOW (ref 78.0–100.0)
Monocytes Absolute: 0.7 10*3/uL (ref 0.1–1.0)
Monocytes Relative: 12.4 % — ABNORMAL HIGH (ref 3.0–12.0)
Neutro Abs: 2.6 10*3/uL (ref 1.4–7.7)
Neutrophils Relative %: 44.9 % (ref 43.0–77.0)
Platelets: 248 10*3/uL (ref 150.0–400.0)
RBC: 5.81 Mil/uL — ABNORMAL HIGH (ref 3.87–5.11)
RDW: 13.5 % (ref 11.5–15.5)
WBC: 5.8 10*3/uL (ref 4.0–10.5)

## 2020-06-14 LAB — FERRITIN: Ferritin: 92 ng/mL (ref 10.0–291.0)

## 2020-06-14 LAB — TSH: TSH: 0.64 u[IU]/mL (ref 0.35–4.50)

## 2020-06-14 LAB — VITAMIN B12: Vitamin B-12: 404 pg/mL (ref 211–911)

## 2020-06-14 LAB — FOLATE: Folate: 9.8 ng/mL (ref >5.9)

## 2020-06-14 LAB — IRON: Iron: 134 ug/dL (ref 42–145)

## 2020-06-14 LAB — HEMOGLOBIN A1C: Hgb A1c MFr Bld: 6.1 % (ref 4.6–6.5)

## 2020-06-14 LAB — VITAMIN D 25 HYDROXY (VIT D DEFICIENCY, FRACTURES): VITD: 27.86 ng/mL — ABNORMAL LOW (ref 30.00–100.00)

## 2020-06-14 MED ORDER — BUDESONIDE-FORMOTEROL FUMARATE 80-4.5 MCG/ACT IN AERO: 2.0000 | INHALATION_SPRAY | Freq: Two times a day (BID) | RESPIRATORY_TRACT | 1 refills | Status: DC

## 2020-06-14 MED ORDER — PHENTERMINE HCL 37.5 MG PO CAPS: 37.5000 mg | ORAL_CAPSULE | ORAL | 2 refills | Status: DC

## 2020-06-14 NOTE — Patient Instructions (Signed)
http://www.aaaai.org/conditions-and-treatments/asthma">  Asthma, Adult  Asthma is a long-term (chronic) condition that causes recurrent episodes in which the airways become tight and narrow. The airways are the passages that lead from the nose and mouth down into the lungs. Asthma episodes, also called asthma attacks, can cause coughing, wheezing, shortness of breath, and chest pain. The airways can also fill with mucus. During an attack, it can be difficult to breathe. Asthma attacks can range from minor to life threatening. Asthma cannot be cured, but medicines and lifestyle changes can help control it and treat acute attacks. What are the causes? This condition is believed to be caused by inherited (genetic) and environmental factors, but its exact cause is not known. There are many things that can bring on an asthma attack or make asthma symptoms worse (triggers). Asthma triggers are different for each person. Common triggers include:  Mold.  Dust.  Cigarette smoke.  Cockroaches.  Things that can cause allergy symptoms (allergens), such as animal dander or pollen from trees or grass.  Air pollutants such as household cleaners, wood smoke, smog, or chemical odors.  Cold air, weather changes, and winds (which increase molds and pollen in the air).  Strong emotional expressions such as crying or laughing hard.  Stress.  Certain medicines (such as aspirin) or types of medicines (such as beta-blockers).  Sulfites in foods and drinks. Foods and drinks that may contain sulfites include dried fruit, potato chips, and sparkling grape juice.  Infections or inflammatory conditions such as the flu, a cold, or inflammation of the nasal membranes (rhinitis).  Gastroesophageal reflux disease (GERD).  Exercise or strenuous activity. What are the signs or symptoms? Symptoms of this condition may occur right after asthma is triggered or many hours later. Symptoms include:  Wheezing. This can  sound like whistling when you breathe.  Excessive nighttime or early morning coughing.  Frequent or severe coughing with a common cold.  Chest tightness.  Shortness of breath.  Tiredness (fatigue) with minimal activity. How is this diagnosed? This condition is diagnosed based on:  Your medical history.  A physical exam.  Tests, which may include: ? Lung function studies and pulmonary studies (spirometry). These tests can evaluate the flow of air in your lungs. ? Allergy tests. ? Imaging tests, such as X-rays. How is this treated? There is no cure for this condition, but treatment can help control your symptoms. Treatment for asthma usually involves:  Identifying and avoiding your asthma triggers.  Using medicines to control your symptoms. Generally, two types of medicines are used to treat asthma: ? Controller medicines. These help prevent asthma symptoms from occurring. They are usually taken every day. ? Fast-acting reliever or rescue medicines. These quickly relieve asthma symptoms by widening the narrow and tight airways. They are used as needed and provide short-term relief.  Using supplemental oxygen. This may be needed during a severe episode.  Using other medicines, such as: ? Allergy medicines, such as antihistamines, if your asthma attacks are triggered by allergens. ? Immune medicines (immunomodulators). These are medicines that help control the immune system.  Creating an asthma action plan. An asthma action plan is a written plan for managing and treating your asthma attacks. This plan includes: ? A list of your asthma triggers and how to avoid them. ? Information about when medicines should be taken and when their dosage should be changed. ? Instructions about using a device called a peak flow meter. A peak flow meter measures how well the lungs are working   and the severity of your asthma. It helps you monitor your condition. Follow these instructions at  home: Controlling your home environment Control your home environment in the following ways to help avoid triggers and prevent asthma attacks:  Change your heating and air conditioning filter regularly.  Limit your use of fireplaces and wood stoves.  Get rid of pests (such as roaches and mice) and their droppings.  Throw away plants if you see mold on them.  Clean floors and dust surfaces regularly. Use unscented cleaning products.  Try to have someone else vacuum for you regularly. Stay out of rooms while they are being vacuumed and for a short while afterward. If you vacuum, use a dust mask from a hardware store, a double-layered or microfilter vacuum cleaner bag, or a vacuum cleaner with a HEPA filter.  Replace carpet with wood, tile, or vinyl flooring. Carpet can trap dander and dust.  Use allergy-proof pillows, mattress covers, and box spring covers.  Keep your bedroom a trigger-free room.  Avoid pets and keep windows closed when allergens are in the air.  Wash beddings every week in hot water and dry them in a dryer.  Use blankets that are made of polyester or cotton.  Clean bathrooms and kitchens with bleach. If possible, have someone repaint the walls in these rooms with mold-resistant paint. Stay out of the rooms that are being cleaned and painted.  Wash your hands often with soap and water. If soap and water are not available, use hand sanitizer.  Do not allow anyone to smoke in your home. General instructions  Take over-the-counter and prescription medicines only as told by your health care provider. ? Speak with your health care provider if you have questions about how or when to take the medicines. ? Make note if you are requiring more frequent dosages.  Do not use any products that contain nicotine or tobacco, such as cigarettes and e-cigarettes. If you need help quitting, ask your health care provider. Also, avoid being exposed to secondhand smoke.  Use a peak  flow meter as told by your health care provider. Record and keep track of the readings.  Understand and use the asthma action plan to help minimize, or stop an asthma attack, without needing to seek medical care.  Make sure you stay up to date on your yearly vaccinations as told by your health care provider. This may include vaccines for the flu and pneumonia.  Avoid outdoor activities when allergen counts are high and when air quality is low.  Wear a ski mask that covers your nose and mouth during outdoor winter activities. Exercise indoors on cold days if you can.  Warm up before exercising, and take time for a cool-down period after exercise.  Keep all follow-up visits as told by your health care provider. This is important. Where to find more information  For information about asthma, turn to the Centers for Disease Control and Prevention at www.cdc.gov/asthma/faqs.htm  For air quality information, turn to AirNow at https://airnow.gov/ Contact a health care provider if:  You have wheezing, shortness of breath, or a cough even while you are taking medicine to prevent attacks.  The mucus you cough up (sputum) is thicker than usual.  Your sputum changes from clear or white to yellow, green, gray, or bloody.  Your medicines are causing side effects, such as a rash, itching, swelling, or trouble breathing.  You need to use a reliever medicine more than 2-3 times a week.  Your peak   flow reading is still at 50-79% of your personal best after following your action plan for 1 hour.  You have a fever. Get help right away if:  You are getting worse and do not respond to treatment during an asthma attack.  You are short of breath when at rest or when doing very little physical activity.  You have difficulty eating, drinking, or talking.  You have chest pain or tightness.  You develop a fast heartbeat or palpitations.  You have a bluish color to your lips or fingernails.  You  are light-headed or dizzy, or you faint.  Your peak flow reading is less than 50% of your personal best.  You feel too tired to breathe normally. Summary  Asthma is a long-term (chronic) condition that causes recurrent episodes in which the airways become tight and narrow. These episodes can cause coughing, wheezing, shortness of breath, and chest pain.  Asthma cannot be cured, but medicines and lifestyle changes can help control it and treat acute attacks.  Make sure you understand how to avoid triggers and how and when to use your medicines.  Asthma attacks can range from minor to life threatening. Get help right away if you have an asthma attack and do not respond to treatment with your usual rescue medicines. This information is not intended to replace advice given to you by your health care provider. Make sure you discuss any questions you have with your health care provider. Document Revised: 09/23/2018 Document Reviewed: 08/25/2016 Elsevier Patient Education  2020 [employer]  

## 2020-06-14 NOTE — Progress Notes (Signed)
Subjective:  Patient ID: Mary Sanford, female    DOB: April 24, 1966  Age: 54 y.o. MRN: [id number]  CC: Cough, Hypertension, and Asthma  This visit occurred during the SARS-CoV-2 public health emergency.  Safety protocols were in place, including screening questions prior to the visit, additional usage of staff PPE, and extensive cleaning of exam room while observing appropriate contact time as indicated for disinfecting solutions.    HPI TOMEIKA WEINMANN presents for f/up -  1.  She complains of a 1 month history of nonproductive cough and fatigue.  She has a history of asthma and is getting some symptom relief with the albuterol inhaler.  She has rare wheezing but denies chest pain, diaphoresis, fever, chills, or lightheadedness.  She rarely smokes a cigarette.  2.  She is status post bariatric surgery.  She would like to take an appetite suppressant to help her lose weight.  She is compliant with her vitamin supplements.  3.  She tells me her blood pressure has been well controlled.  She is active and denies any recent episodes of chest pain, DOE, dizziness, or lightheadedness.  Outpatient Medications Prior to Visit  Medication Sig Dispense Refill  . Acetaminophen (TYLENOL PO) Take by mouth as needed.    . calcium citrate (CALCITRATE - DOSED IN MG ELEMENTAL CALCIUM) 950 MG tablet Take 200 mg of elemental calcium by mouth daily.    Marland Kitchen escitalopram (LEXAPRO) 20 MG tablet Take 1 tablet (20 mg total) by mouth daily. 90 tablet 1  . Multiple Vitamins-Minerals (MULTIVITAMIN WITH MINERALS) tablet Take 1 tablet by mouth daily. Bariatric chewable     . nebivolol (BYSTOLIC) 5 MG tablet Take 1 tablet (5 mg total) by mouth daily. 84 tablet 0  . terbinafine (LAMISIL) 250 MG tablet Take 1 tablet (250 mg total) by mouth daily. 90 tablet 0  . traMADol (ULTRAM) 50 MG tablet Take 1 tablet (50 mg total) by mouth every 8 (eight) hours as needed for severe pain. 15 tablet 0  . triamterene-hydrochlorothiazide  (MAXZIDE-25) 37.5-25 MG tablet Take 1 tablet by mouth once daily 90 tablet 0  . UNABLE TO FIND Apple cidar gummy    . albuterol (PROVENTIL HFA;VENTOLIN HFA) 108 (90 Base) MCG/ACT inhaler Inhale 1-2 puffs into the lungs every 6 (six) hours as needed for wheezing or shortness of breath. (Patient taking differently: Inhale 1-2 puffs into the lungs every 6 (six) hours as needed for wheezing or shortness of breath (for seasonal allergies). ) 1 Inhaler 0  . Cholecalciferol (VITAMIN D3) 25 MCG (1000 UT) CHEW Chew by mouth daily.     . benzonatate (TESSALON) 200 MG capsule Take 1 capsule (200 mg total) by mouth 3 (three) times daily as needed. 30 capsule 0  . doxycycline (VIBRA-TABS) 100 MG tablet Take 1 tablet (100 mg total) by mouth 2 (two) times daily. 20 tablet 0  . predniSONE (DELTASONE) 20 MG tablet Take 2 tablets (40 mg total) by mouth daily with breakfast. 10 tablet 0   No facility-administered medications prior to visit.    ROS Review of Systems  Constitutional: Positive for fatigue. Negative for activity change, appetite change, chills, diaphoresis, fever and unexpected weight change.  HENT: Negative.   Eyes: Negative.   Respiratory: Positive for cough and wheezing. Negative for chest tightness and shortness of breath.   Cardiovascular: Negative for chest pain, palpitations and leg swelling.  Gastrointestinal: Negative for abdominal pain, constipation, diarrhea, nausea and vomiting.  Endocrine: Negative.   Genitourinary: Negative.  Negative  for difficulty urinating, dysuria and hematuria.  Musculoskeletal: Negative for arthralgias and myalgias.  Skin: Negative.  Negative for color change, pallor and rash.  Neurological: Negative.  Negative for dizziness, weakness and light-headedness.  Hematological: Negative for adenopathy. Does not bruise/bleed easily.  Psychiatric/Behavioral: Negative.     Objective:  BP 120/78   Pulse 66   Temp 98 F (36.7 C) (Oral)   Resp 16   Ht 5\' 9"  (1.753  m)   Wt 202 lb (91.6 kg)   SpO2 97%   BMI 29.83 kg/m   BP Readings from Last 3 Encounters:  06/14/20 120/78  06/08/20 114/66  12/28/19 110/68    Wt Readings from Last 3 Encounters:  06/14/20 202 lb (91.6 kg)  06/08/20 206 lb 6.4 oz (93.6 kg)  12/28/19 203 lb (92.1 kg)    Physical Exam Vitals reviewed.  HENT:     Nose: Nose normal.     Mouth/Throat:     Mouth: Mucous membranes are moist.  Eyes:     General: No scleral icterus.    Conjunctiva/sclera: Conjunctivae normal.  Cardiovascular:     Rate and Rhythm: Normal rate and regular rhythm.     Heart sounds: No murmur heard.   Pulmonary:     Effort: Pulmonary effort is normal.     Breath sounds: No stridor. No wheezing, rhonchi or rales.  Abdominal:     General: Abdomen is flat. Bowel sounds are normal. There is no distension.     Palpations: Abdomen is soft. There is no hepatomegaly, splenomegaly or mass.     Tenderness: There is no abdominal tenderness.  Musculoskeletal:        General: Normal range of motion.     Cervical back: Neck supple.     Right lower leg: No edema.     Left lower leg: No edema.  Lymphadenopathy:     Cervical: No cervical adenopathy.  Skin:    General: Skin is warm and dry.     Coloration: Skin is not pale.  Neurological:     General: No focal deficit present.     Mental Status: She is alert.  Psychiatric:        Mood and Affect: Mood normal.        Behavior: Behavior normal.     Lab Results  Component Value Date   WBC 5.8 06/14/2020   HGB 13.6 06/14/2020   HCT 42.9 06/14/2020   PLT 248.0 06/14/2020   GLUCOSE 95 06/14/2020   CHOL 120 06/14/2020   TRIG 84.0 06/14/2020   HDL 67.70 06/14/2020   LDLCALC 36 06/14/2020   ALT 28 12/13/2019   AST 24 12/13/2019   NA 137 06/14/2020   K 4.3 06/14/2020   CL 101 06/14/2020   CREATININE 0.77 06/14/2020   BUN 18 06/14/2020   CO2 31 06/14/2020   TSH 0.64 06/14/2020   HGBA1C 6.1 06/14/2020    [place] Intraoperative  Result Date:  12/13/2019 CLINICAL DATA:  Ultrasound was provided for use by the ordering physician, and a technical charge was applied by the performing facility.  No radiologist interpretation/professional services rendered.   DG Chest 2 View  Result Date: 06/14/2020 CLINICAL DATA:  Cough for 1 month, hypertension, previous tobacco abuse EXAM: CHEST - 2 VIEW COMPARISON:  02/22/2019 FINDINGS: The heart size and mediastinal contours are within normal limits. Both lungs are clear. The visualized skeletal structures are unremarkable. IMPRESSION: No active cardiopulmonary disease. Electronically Signed   By: Diana Eves.D.  On: 06/14/2020 21:39    Assessment & Plan:   Legaci was seen today for cough, hypertension and asthma.  Diagnoses and all orders for this visit:  Cough- Her chest x-ray is negative for mass or infiltrate.  I encouraged her to quit smoking. -     DG Chest 2 View; Future  Mild persistent asthma without complication- She has persistent symptoms.  I recommended that she add an ICS/LABA inhaler to the SABA inhaler. -     budesonide-formoterol (SYMBICORT) 80-4.5 MCG/ACT inhaler; Inhale 2 puffs into the lungs 2 (two) times daily. -     albuterol (VENTOLIN HFA) 108 (90 Base) MCG/ACT inhaler; Inhale 1-2 puffs into the lungs every 6 (six) hours as needed for wheezing or shortness of breath (for seasonal allergies).  Tobacco abuse -     DG Chest 2 View; Future  Essential hypertension, benign- Her blood pressure is adequately well controlled.  Electrolytes and renal function are normal. -     CBC with Differential/Platelet; Future -     Basic metabolic panel; Future -     TSH; Future -     TSH -     Basic metabolic panel -     CBC with Differential/Platelet  Other iron deficiency anemia- Her H&H are normal now. -     CBC with Differential/Platelet; Future -     Iron; Future -     Ferritin; Future -     Ferritin -     Iron -     CBC with Differential/Platelet  Vitamin B12 deficiency  anemia due to intrinsic factor deficiency- Her H&H, B12, and folate levels are normal.  She will continue the B12 supplement. -     CBC with Differential/Platelet; Future -     Vitamin B12; Future -     Folate; Future -     Folate -     Vitamin B12 -     CBC with Differential/Platelet  Obesity (BMI 30-39.9)  Routine general medical examination at a health care facility- Exam completed, labs reviewed, vaccines reviewed and updated, cancer screenings are UTD patient education was given. -     Lipid panel; Future -     Lipid panel  Vitamin D insufficiency -     VITAMIN D 25 Hydroxy (Vit-D Deficiency, Fractures); Future -     VITAMIN D 25 Hydroxy (Vit-D Deficiency, Fractures) -     Cholecalciferol 50 MCG (2000 UT) TABS; Take 1 tablet (2,000 Units total) by mouth daily.  Overweight (BMI 25.0-29.9) -     phentermine 37.5 MG capsule; Take 1 capsule (37.5 mg total) by mouth every morning. -     TSH; Future -     TSH  Prediabetes- Her A1c is up to 6.1%.  Medical therapy is not indicated.  She will improve her lifestyle modifications. -     Basic metabolic panel; Future -     Hemoglobin A1c; Future -     Hemoglobin A1c -     Basic metabolic panel  Acute upper respiratory infection  Other orders -     Flu Vaccine QUAD 6+ mos PF IM (Fluarix Quad PF) -     Pneumococcal polysaccharide vaccine 23-valent greater than or equal to 2yo subcutaneous/IM   I have discontinued Olivia Mackie D. Bristol's Vitamin D3, doxycycline, predniSONE, and benzonatate. I have also changed her albuterol. Additionally, I am having her start on budesonide-formoterol, phentermine, and Cholecalciferol. Lastly, I am having her maintain her multivitamin with minerals, calcium citrate,  Acetaminophen (TYLENOL PO), UNABLE TO FIND, nebivolol, escitalopram, triamterene-hydrochlorothiazide, terbinafine, and traMADol.  Meds ordered this encounter  Medications  . budesonide-formoterol (SYMBICORT) 80-4.5 MCG/ACT inhaler    Sig:  Inhale 2 puffs into the lungs 2 (two) times daily.    Dispense:  3 each    Refill:  1  . phentermine 37.5 MG capsule    Sig: Take 1 capsule (37.5 mg total) by mouth every morning.    Dispense:  30 capsule    Refill:  2  . Cholecalciferol 50 MCG (2000 UT) TABS    Sig: Take 1 tablet (2,000 Units total) by mouth daily.    Dispense:  90 tablet    Refill:  1  . albuterol (VENTOLIN HFA) 108 (90 Base) MCG/ACT inhaler    Sig: Inhale 1-2 puffs into the lungs every 6 (six) hours as needed for wheezing or shortness of breath (for seasonal allergies).    Dispense:  18 g    Refill:  3   In addition to time spent on CPE, I spent 50 minutes in preparing to see the patient by review of recent labs, imaging and procedures, obtaining and reviewing separately obtained history, communicating with the patient and family or caregiver, ordering medications, tests or procedures, and documenting clinical information in the EHR including the differential Dx, treatment, and any further evaluation and other management of 1. Cough 2. Mild persistent asthma without complication 3. Tobacco abuse 4. Essential hypertension, benign 5. Other iron deficiency anemia 6. Vitamin B12 deficiency anemia due to intrinsic factor deficiency 7. Vitamin D insufficiency 8. Overweight (BMI 25.0-29.9) 9. Prediabetes    Follow-up: Return in about 3 months (around 09/14/2020).  Scarlette Calico, MD

## 2020-06-15 MED ORDER — CHOLECALCIFEROL 50 MCG (2000 UT) PO TABS: 1.0000 | ORAL_TABLET | Freq: Every day | ORAL | 1 refills | Status: DC

## 2020-06-15 MED ORDER — ALBUTEROL SULFATE HFA 108 (90 BASE) MCG/ACT IN AERS: 1.0000 | INHALATION_SPRAY | Freq: Four times a day (QID) | RESPIRATORY_TRACT | 3 refills | Status: DC | PRN

## 2020-06-20 ENCOUNTER — Encounter: Payer: Self-pay | Admitting: Internal Medicine

## 2020-06-20 ENCOUNTER — Ambulatory Visit: Payer: Self-pay

## 2020-06-20 ENCOUNTER — Ambulatory Visit (INDEPENDENT_AMBULATORY_CARE_PROVIDER_SITE_OTHER): Payer: Federal, State, Local not specified - PPO | Admitting: Internal Medicine

## 2020-06-20 ENCOUNTER — Other Ambulatory Visit: Payer: Self-pay

## 2020-06-20 VITALS — BP 115/80 | HR 80 | Ht 69.0 in | Wt 201.6 lb

## 2020-06-20 DIAGNOSIS — M5459 Other low back pain: Secondary | ICD-10-CM

## 2020-06-20 DIAGNOSIS — M79645 Pain in left finger(s): Secondary | ICD-10-CM

## 2020-06-20 DIAGNOSIS — M545 Low back pain, unspecified: Secondary | ICD-10-CM | POA: Diagnosis not present

## 2020-06-20 DIAGNOSIS — G8929 Other chronic pain: Secondary | ICD-10-CM | POA: Insufficient documentation

## 2020-06-20 DIAGNOSIS — R768 Other specified abnormal immunological findings in serum: Secondary | ICD-10-CM | POA: Insufficient documentation

## 2020-06-20 NOTE — Progress Notes (Signed)
Office Visit Note  Patient: Mary Sanford             Date of Birth: 11-Jan-1966           MRN: [id number]             PCP: [name], MD Referring: Gregor Hams, MD Visit Date: 06/20/2020 Occupation: Web designer work  Subjective:  Pain and Edema of the Left Hand, Pain of the Lower Back, Pain of the Left Knee, Pain of the Right Knee, and New Patient (Initial Visit)   History of Present Illness: Mary Sanford is a 54 y.o. female here for evaluation of positive ANA and recurrent joint pain of multiple sites including the hands, back, and knees but in particular left thumb pain has increased during the past few months. She does not recall any specific injuries or change in medications. She has brief stiffness in the morning but joint pain is more often increased with use. She has occasional swelling in her lower legs but not in other joints. Workup for persistent joint pain has demonstrated left 1st [facility] arthritis serology positive with ANA 1:80 and negative for RA associated antibodies. She denies new skin rashes, eye inflammation, oral ulcers, lymphadenopathy, or raynaud's phenomenon. She has no history of blood clots or major pregnancy complications.  Labs reviewed 06/2020 RF negative CCP negative ANA 1:80 nuclear, speckled ESR normal Uric acid 4.8 TSH 0.64 Vitamin D 27.86  Imaging reviewed 06/2020 Xray left hand "1. Progressive osteoarthrosis at the first carpometacarpal joint when compared to prior imaging. 2. Osseous fragment seen lateral to the scaphotrapezial articulation, possibly degenerative or remote injury sequela given some questionable mineralization in this vicinity on comparison Imaging."  Activities of Daily Living:  Patient reports morning stiffness for 2-5 minutes.   Patient Reports nocturnal pain.  Difficulty dressing/grooming: Denies Difficulty climbing stairs: Denies Difficulty getting out of chair: Denies Difficulty using hands for  taps, buttons, cutlery, and/or writing: Reports  Review of Systems  Constitutional: Positive for fatigue.  HENT: Negative for mouth sores, mouth dryness and nose dryness.   Eyes: Negative for pain, itching, visual disturbance and dryness.  Respiratory: Positive for cough. Negative for hemoptysis, shortness of breath and difficulty breathing.   Cardiovascular: Negative for chest pain, palpitations and swelling in legs/feet.  Gastrointestinal: Negative for abdominal pain, blood in stool, constipation and diarrhea.  Endocrine: Negative for increased urination.  Genitourinary: Negative for painful urination.  Musculoskeletal: Positive for arthralgias, joint pain, joint swelling, myalgias, muscle weakness, morning stiffness, muscle tenderness and myalgias.  Skin: Negative for color change, rash and redness.  Allergic/Immunologic: Negative for susceptible to infections.  Neurological: Positive for dizziness. Negative for numbness, headaches, memory loss and weakness.  Hematological: Negative for swollen glands.  Psychiatric/Behavioral: Negative for confusion and sleep disturbance.    PMFS History:  Patient Active Problem List   Diagnosis Date Noted  . Positive ANA (antinuclear antibody) 06/20/2020  . Chronic pain of left thumb 06/20/2020  . Mild persistent asthma without complication 56/21/3086  . Overweight (BMI 25.0-29.9) 06/14/2020  . Status post bariatric surgery 06/16/2019  . Vitamin D insufficiency 06/16/2019  . Cough 02/22/2019  . Low back pain 06/22/2018  . ASCUS pap with negative high risk HPV on 06/30/16 06/30/2016  . Depression with anxiety 12/28/2015  . Anemia, iron deficiency 09/17/2013  . Routine general medical examination at a health care facility 01/12/2013  . Allergic rhinitis 01/12/2013  . Constipation, chronic 10/04/2012  . Visit for screening mammogram  01/12/2012  . B12 deficiency anemia 01/09/2012  . Obesity (BMI 30-39.9) 01/09/2012  . Essential hypertension,  benign 11/24/2011    Past Medical History:  Diagnosis Date  . Anxiety   . Arthritis    hands, left knee  . Depression   . Family history of adverse reaction to anesthesia    one sister ponv and slow to awaken  . History of iron deficiency anemia   . History of kidney stones   . Hypertension    followed by pcp  (12-05-2019 pt had ETT 12-11-2011 , in epic, showed normal with no ischemia)  . Nephrolithiasis   . Panic disorder   . PMB (postmenopausal bleeding)   . Pre-diabetes    followed by pcp  . Seasonal allergies   . SUI (stress urinary incontinence, female)   . Uterine fibroid   . Wears glasses     Family History  Problem Relation Age of Onset  . Hypertension Mother   . Hypothyroidism Mother   . Rectal cancer Mother 79  . Colon cancer Mother 52  . Hypotension Sister   . Hypothyroidism Sister   . Diabetes Sister   . Other Sister        pacemaker  . Seizures Brother   . Hypothyroidism Sister   . Seizures Sister   . Anemia Sister   . Deep vein thrombosis Sister   . Seizures Paternal Grandfather   . Stroke Maternal Uncle        > 55  . Diabetes Maternal Grandmother   . Kidney failure Maternal Grandmother   . Heart attack Maternal Grandfather   . Other Paternal Grandmother        tumors throughout body  . Hypertension Maternal Uncle    Past Surgical History:  Procedure Laterality Date  . BUNIONECTOMY Bilateral   . CESAREAN SECTION  1989  . CYSTOSCOPY W/ RETROGRADES Left 06/28/2018   Procedure: CYSTOSCOPY WITH RETROGRADE PYELOGRAM LEFT STENT;  Surgeon: Ceasar Mons, MD;  Location: [facility];  Service: Urology;  Laterality: Left;  . CYSTOSCOPY/URETEROSCOPY/HOLMIUM LASER/STENT PLACEMENT Left 07/16/2018   Procedure: CYSTOSCOPY/URETEROSCOPY/HOLMIUM LASER/STENT EXCHANGE;  Surgeon: Ceasar Mons, MD;  Location[facility];  Service: Urology;  Laterality: Left;  . DILATATION & CURETTAGE/HYSTEROSCOPY WITH MYOSURE N/A 12/13/2019    Procedure: DILATATION & CURETTAGE/HYSTEROSCOPY WITH MYOSURE;  Surgeon: Salvadore Dom, MD;  Location: [facility];  Service: Gynecology;  Laterality: N/A;  . DILITATION & CURRETTAGE/HYSTROSCOPY WITH NOVASURE ABLATION N/A 06/17/2013   Procedure: DILATATION & CURETTAGE/HYSTEROSCOPY WITH NOVASURE ABLATION;  Surgeon: Osborne Oman, MD;  Location: [facility];  Service: Gynecology;  Laterality: N/A;  . 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 HEMORRHOIDECTOMY WITH HEMORRHOID BANDING  10/19/2012   [facility] Hem ligation/pexy  . OPERATIVE ULTRASOUND N/A 12/13/2019   Procedure: OPERATIVE ULTRASOUND;  Surgeon: Salvadore Dom, MD;  Location[facility];  Service: Gynecology;  Laterality: N/A;  . TUBAL LIGATION Bilateral 2001  . WISDOM TOOTH EXTRACTION     Social History   Social History Narrative  . Not on file   Immunization History  Administered Date(s) Administered  . Influenza,inj,Quad PF,6+ Mos 04/01/2017, 07/05/2018, 06/03/2019, 06/14/2020  . Moderna SARS-COVID-2 Vaccination 11/03/2019, 12/06/2019  . Pneumococcal Polysaccharide-23 06/14/2020  . Tdap 01/09/2012     Objective: Vital Signs: BP 115/80 (BP Location: Right Arm, Patient  Position: Sitting, Cuff Size: Small)   Pulse 80   Ht 5' 9" (1.753 m)   Wt 201 lb 9.6 oz (91.4 kg)   BMI 29.77 kg/m    Physical Exam HENT:     Right Ear: External ear normal.     Left Ear: External ear normal.     Mouth/Throat:     Mouth: Mucous membranes are moist.     Pharynx: Oropharynx is clear.  Eyes:     Conjunctiva/sclera: Conjunctivae normal.  Cardiovascular:     Rate and Rhythm: Normal rate and regular rhythm.  Pulmonary:     Effort: Pulmonary effort is normal.     Breath sounds: Normal breath sounds.  Skin:    General: Skin is warm and dry.      Findings: No rash.  Neurological:     General: No focal deficit present.     Mental Status: She is alert.     Musculoskeletal Exam:  Neck full range of motion no tenderness Shoulder, elbow, wrist, fingers full range of motion, mild tenderness at base of left thumb Bilateral paraspinal tenderness over lumbar spine and SI joints Normal hip internal and external rotation ROM, external rotation provokes posterior hip pain Knees, ankles, MTPs full range of motion no tenderness or swelling   CDAI Exam: CDAI Score: -- Patient Global: --; Provider Global: -- Swollen: --; Tender: -- Joint Exam 06/20/2020   No joint exam has been documented for this visit   There is currently no information documented on the homunculus. Go to the Rheumatology activity and complete the homunculus joint exam.  Investigation: No additional findings.  Imaging: DG Chest 2 View  Result Date: 06/14/2020 CLINICAL DATA:  Cough for 1 month, hypertension, previous tobacco abuse EXAM: CHEST - 2 VIEW COMPARISON:  02/22/2019 FINDINGS: The heart size and mediastinal contours are within normal limits. Both lungs are clear. The visualized skeletal structures are unremarkable. IMPRESSION: No active cardiopulmonary disease. Electronically Signed   By: Randa Ngo M.D.   On: 06/14/2020 21:39   DG Hand Complete Left  Result Date: 06/11/2020 CLINICAL DATA:  CMC pain for 1 year EXAM: LEFT HAND - COMPLETE 3+ VIEW COMPARISON:  Radiograph 06/21/2015 FINDINGS: Progressive osteoarthrosis at the first carpometacarpal joint when compared to prior imaging. Additional ossific fragment seen lateral to the scaphotrapezial articulation which could reflect some degenerative ossification or remote fracture given some questionable mineralization in this vicinity on comparison imaging. Corticated appearance arguing against an acute avulsion type injury. Could correlate for point tenderness. No other acute or worrisome osseous abnormality. No  other significant arthropathy. Soft tissues are unremarkable. IMPRESSION: 1. Progressive osteoarthrosis at the first carpometacarpal joint when compared to prior imaging. 2. Osseous fragment seen lateral to the scaphotrapezial articulation, possibly degenerative or remote injury sequela given some questionable mineralization in this vicinity on comparison imaging. Electronically Signed   By: Lovena Le M.D.   On: 06/11/2020 01:57   XR Pelvis 1-2 Views  Result Date: 06/20/2020 X-ray pelvis 2 views AP and Ferguson Sacroiliac joints are patent bilaterally without significant erosion or sclerosis.  No significant hip osteoarthritis seen.  Possible small calcification above left trochanteric head seen on AP film not clearly visualized in [place] view.  Bone mineralization appears normal. Impression No significant hip or SI joint arthritis demonstrated   Recent Labs: Lab Results  Component Value Date   WBC 5.8 06/14/2020   HGB 13.6 06/14/2020   PLT 248.0 06/14/2020   NA 137 06/14/2020   K 4.3 06/14/2020  CL 101 06/14/2020   CO2 31 06/14/2020   GLUCOSE 95 06/14/2020   BUN 18 06/14/2020   CREATININE 0.77 06/14/2020   BILITOT 0.6 12/13/2019   ALKPHOS 60 12/13/2019   AST 24 12/13/2019   ALT 28 12/13/2019   PROT 6.2 (L) 12/13/2019   ALBUMIN 3.6 12/13/2019   CALCIUM 9.6 06/14/2020   GFRAA >60 12/13/2019    Speciality Comments: No specialty comments available.  Procedures:  No procedures performed Allergies: Naproxen   Assessment / Plan:     Visit Diagnoses: Positive ANA (antinuclear antibody) - Plan: Anti-DNA antibody, double-stranded, Sjogrens syndrome-B extractable nuclear antibody, Sjogrens syndrome-A extractable nuclear antibody, Anti-Smith antibody, C3 and C4  Current she does not demonstrate clinical features of systemic lupus or related disease. I will check more specific ENA panel with SSA, SSB, dsDNA, Smith, complement C3 and C4. If negative or equivocal would not recommend  any further workup for autoimmune disease.  Chronic right-sided low back pain without sciatica - Plan: XR Pelvis 1-2 Views  Chronic low back and hip pain localizes to paraspinal muscles and around SI joints. Xray was obtained showing no SI joint inflammatory changes. Based on this seems to be muscular or tendinous pain recommended starting daily stretching.  Chronic pain of left thumb  OA of left thumb no inflammatory changes seen on hands during exam today. Negative serology and no erosive changes on recent xray no concern for RA.  Orders: Orders Placed This Encounter  Procedures  . XR Pelvis 1-2 Views  . Anti-DNA antibody, double-stranded  . Sjogrens syndrome-B extractable nuclear antibody  . Sjogrens syndrome-A extractable nuclear antibody  . Anti-Smith antibody  . C3 and C4   No orders of the defined types were placed in this encounter.  Follow-Up Instructions: Return if symptoms worsen or fail to improve.   Collier Salina, MD  Note - This record has been created using [employer].  Chart creation errors have been sought, but may not always  have been located. Such creation errors do not reflect on  the standard of medical care.

## 2020-06-20 NOTE — Patient Instructions (Addendum)
You have a positive ANA test this is often seen in patients with autoimmune disease such as lupus, where the immune system inappropriately attacks healthy parts of the body. I do not see evidence of this so I so no think you have a disease like this currently. I will check tests that are more specific to try and exclude it and if these are negative I think no more investigation is needed. We will call after seeing these results whether you need to return.  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 There is no specific medication to treat this but antiinflammatory medicines can be used or topical antiinflammatory medicines. Braces or sleeves can sometimes help. Severe osteoarthritis pain can sometimes benefit from seeing occupational therapy or a hand surgeon but most cases do not require this.    Osteoarthritis  Osteoarthritis is a type of arthritis that affects tissue that covers the ends of bones in joints (cartilage). Cartilage acts as a cushion between the bones and helps them move smoothly. Osteoarthritis results when cartilage in the joints gets worn down. Osteoarthritis is sometimes called "wear and tear" arthritis. Osteoarthritis is the most common form of arthritis. It often occurs in older people. It is a condition that gets worse over time (a progressive condition). Joints that are most often affected by this condition are in:  Fingers.  Toes.  Hips.  Knees.  Spine, including neck and lower back. What are the causes? This condition is caused by age-related wearing down of cartilage that covers the ends of bones. What increases the risk? The following factors may make you more likely to develop this condition:  Older age.  Being overweight or obese.  Overuse of joints, such as in athletes.  Past injury of a joint.  Past surgery on a joint.  Family history of osteoarthritis. What are the signs or symptoms? The main symptoms of this  condition are pain, swelling, and stiffness in the joint. The joint may lose its shape over time. Small pieces of bone or cartilage may break off and float inside of the joint, which may cause more pain and damage to the joint. Small deposits of bone (osteophytes) may grow on the edges of the joint. Other symptoms may include:  A grating or scraping feeling inside the joint when you move it.  Popping or creaking sounds when you move. Symptoms may affect one or more joints. Osteoarthritis in a major joint, such as your knee or hip, can make it painful to walk or exercise. If you have osteoarthritis in your hands, you might not be able to grip items, twist your hand, or control small movements of your hands and fingers (fine motor skills). How is this diagnosed? This condition may be diagnosed based on:  Your medical history.  A physical exam.  Your symptoms.  X-rays of the affected joint(s).  Blood tests to rule out other types of arthritis. How is this treated? There is no cure for this condition, but treatment can help to control pain and improve joint function. Treatment plans may include:  A prescribed exercise program that allows for rest and joint relief. You may work with a physical therapist.  A weight control plan.  Pain relief techniques, such as: ? Applying heat and cold to the joint. ? Electric pulses delivered to nerve endings under the skin (transcutaneous electrical nerve stimulation, or TENS). ? Massage. ? Certain nutritional supplements.  NSAIDs or prescription medicines to help  relieve pain.  Medicine to help relieve pain and inflammation (corticosteroids). This can be given by mouth (orally) or as an injection.  Assistive devices, such as a brace, wrap, splint, specialized glove, or cane.  Surgery, such as: ? An osteotomy. This is done to reposition the bones and relieve pain or to remove loose pieces of bone and cartilage. ? Joint replacement surgery. You may  need this surgery if you have very bad (advanced) osteoarthritis. Follow these instructions at home: Activity  Rest your affected joints as directed by your health care provider.  Do not drive or use heavy machinery while taking prescription pain medicine.  Exercise as directed. Your health care provider or physical therapist may recommend specific types of exercise, such as: ? Strengthening exercises. These are done to strengthen the muscles that support joints that are affected by arthritis. They can be performed with weights or with exercise bands to add resistance. ? Aerobic activities. These are exercises, such as brisk walking or water aerobics, that get your heart pumping. ? Range-of-motion activities. These keep your joints easy to move. ? Balance and agility exercises. Managing pain, stiffness, and swelling      If directed, apply heat to the affected area as often as told by your health care provider. Use the heat source that your health care provider recommends, such as a moist heat pack or a heating pad. ? If you have a removable assistive device, remove it as told by your health care provider. ? Place a towel between your skin and the heat source. If your health care provider tells you to keep the assistive device on while you apply heat, place a towel between the assistive device and the heat source. ? Leave the heat on for 20-30 minutes. ? Remove the heat if your skin turns bright red. This is especially important if you are unable to feel pain, heat, or cold. You may have a greater risk of getting burned.  If directed, put ice on the affected joint: ? If you have a removable assistive device, remove it as told by your health care provider. ? Put ice in a plastic bag. ? Place a towel between your skin and the bag. If your health care provider tells you to keep the assistive device on during icing, place a towel between the assistive device and the bag. ? Leave the ice on  for 20 minutes, 2-3 times a day. General instructions  Take over-the-counter and prescription medicines only as told by your health care provider.  Maintain a healthy weight. Follow instructions from your health care provider for weight control. These may include dietary restrictions.  Do not use any products that contain nicotine or tobacco, such as cigarettes and e-cigarettes. These can delay bone healing. If you need help quitting, ask your health care provider.  Use assistive devices as directed by your health care provider.  Keep all follow-up visits as told by your health care provider. This is important. Where to find more information  [employer] of Arthritis and Musculoskeletal and Skin Diseases: www.niams.SouthExposed.es  [employer] on Aging: http://kim-miller.com/  American College of Rheumatology: www.rheumatology.org Contact a health care provider if:  Your skin turns red.  You develop a rash.  You have pain that gets worse.  You have a fever along with joint or muscle aches. Get help right away if:  You lose a lot of weight.  You suddenly lose your appetite.  You have night sweats. Summary  Osteoarthritis is a  type of arthritis that affects tissue covering the ends of bones in joints (cartilage).  This condition is caused by age-related wearing down of cartilage that covers the ends of bones.  The main symptom of this condition is pain, swelling, and stiffness in the joint.  There is no cure for this condition, but treatment can help to control pain and improve joint function. This information is not intended to replace advice given to you by your health care provider. Make sure you discuss any questions you have with your health care provider. Document Revised: 07/03/2017 Document Reviewed: 03/24/2016 Elsevier Patient Education  [place].     Antinuclear Antibody Test Why am I having this test? This is a test that is used to help diagnose  systemic lupus erythematosus (SLE) and other autoimmune diseases. An autoimmune disease is a disease in which the body's own defense (immune)system attacks its organs. What is being tested? This test checks for antinuclear antibodies (ANA) in the blood. The presence of ANA is associated with several autoimmune diseases. It is seen in almost all patients with lupus. What kind of sample is taken?  A blood sample is required for this test. It is usually collected by inserting a needle into a blood vessel. How are the results reported? Your test results will be reported as either positive or negative. A false-positive result can occur. A false positive is incorrect because it means that a condition is present when it is not. What do the results mean? A positive test result may mean that you have:  Lupus.  Other autoimmune diseases, such as rheumatoid arthritis, scleroderma, or Sjgren syndrome. Conditions that may cause a false-positive result include:  Liver dysfunction.  Myasthenia gravis.  Infectious mononucleosis. Talk with your health care provider about what your results mean. Questions to ask your health care provider Ask your health care provider, or the department that is doing the test:  When will my results be ready?  How will I get my results?  What are my treatment options?  What other tests do I need?  What are my next steps? Summary  This is a test that is used to help diagnose systemic lupus erythematosus (SLE) and other autoimmune diseases. An autoimmune disease is a disease in which the body's own defense (immune)system attacks the body.  This test checks for antinuclear antibodies (ANA) in the blood. The presence of ANA is associated with several autoimmune diseases. It is seen in almost all patients with lupus.  Your test results will be reported as either positive or negative. Talk with your health care provider about what your results mean. This information  is not intended to replace advice given to you by your health care provider. Make sure you discuss any questions you have with your health care provider. Document Revised: 07/03/2017 Document Reviewed: 03/19/2017 Elsevier Patient Education  [place].

## 2020-06-21 LAB — ANTI-SMITH ANTIBODY: ENA SM Ab Ser-aCnc: <1 AI

## 2020-06-21 LAB — SJOGRENS SYNDROME-B EXTRACTABLE NUCLEAR ANTIBODY: SSB (La) (ENA) Antibody, IgG: <1 AI

## 2020-06-21 LAB — ANTI-DNA ANTIBODY, DOUBLE-STRANDED: ds DNA Ab: <1 IU/mL

## 2020-06-21 LAB — SJOGRENS SYNDROME-A EXTRACTABLE NUCLEAR ANTIBODY: SSA (Ro) (ENA) Antibody, IgG: <1 AI

## 2020-06-21 LAB — C3 AND C4
C3 Complement: 131 mg/dL (ref 83–193)
C4 Complement: 25 mg/dL (ref 15–57)

## 2020-06-22 NOTE — Progress Notes (Signed)
Lupus related antibody tests are negative. I do not suspect she has any autoimmune disease and does not need to follow up in Rheumatology clinic.

## 2020-07-14 DIAGNOSIS — J45909 Unspecified asthma, uncomplicated: Secondary | ICD-10-CM | POA: Diagnosis not present

## 2020-07-14 DIAGNOSIS — U071 COVID-19: Secondary | ICD-10-CM | POA: Diagnosis not present

## 2020-07-16 ENCOUNTER — Ambulatory Visit: Payer: Federal, State, Local not specified - PPO | Admitting: Internal Medicine

## 2020-08-03 ENCOUNTER — Other Ambulatory Visit: Payer: Self-pay | Admitting: Internal Medicine

## 2020-08-03 DIAGNOSIS — I10 Essential (primary) hypertension: Secondary | ICD-10-CM

## 2020-08-07 DIAGNOSIS — U071 COVID-19: Secondary | ICD-10-CM | POA: Diagnosis not present

## 2020-08-24 ENCOUNTER — Telehealth: Payer: Self-pay | Admitting: Internal Medicine

## 2020-08-24 NOTE — Telephone Encounter (Signed)
    Patient calling to report blood pressure is 156/114, 162/111, blurred vision and dizziness  Call transferred to [facility]

## 2020-08-24 NOTE — Telephone Encounter (Signed)
[facility] FYI  Caller states that she has been having elevated blood pressure - having head aches, blurry vision and dizziness - current 156/114  [facility] advised: Go to ED now  Patient understood and complied.

## 2020-08-25 ENCOUNTER — Other Ambulatory Visit: Payer: Self-pay

## 2020-08-25 ENCOUNTER — Encounter (HOSPITAL_COMMUNITY): Payer: Self-pay | Admitting: *Deleted

## 2020-08-25 ENCOUNTER — Emergency Department (HOSPITAL_COMMUNITY)
Admission: EM | Admit: 2020-08-25 | Discharge: 2020-08-25 | Disposition: A | Discharge status: Home / Self Care | Payer: Federal, State, Local not specified - PPO | Attending: Emergency Medicine | Admitting: Emergency Medicine

## 2020-08-25 ENCOUNTER — Emergency Department (HOSPITAL_COMMUNITY): Payer: Federal, State, Local not specified - PPO

## 2020-08-25 DIAGNOSIS — Z9889 Other specified postprocedural states: Secondary | ICD-10-CM | POA: Insufficient documentation

## 2020-08-25 DIAGNOSIS — J4530 Mild persistent asthma, uncomplicated: Secondary | ICD-10-CM | POA: Diagnosis not present

## 2020-08-25 DIAGNOSIS — Z8616 Personal history of COVID-19: Secondary | ICD-10-CM | POA: Insufficient documentation

## 2020-08-25 DIAGNOSIS — F17210 Nicotine dependence, cigarettes, uncomplicated: Secondary | ICD-10-CM | POA: Insufficient documentation

## 2020-08-25 DIAGNOSIS — Z7952 Long term (current) use of systemic steroids: Secondary | ICD-10-CM | POA: Insufficient documentation

## 2020-08-25 DIAGNOSIS — R42 Dizziness and giddiness: Secondary | ICD-10-CM | POA: Diagnosis not present

## 2020-08-25 DIAGNOSIS — R519 Headache, unspecified: Secondary | ICD-10-CM | POA: Diagnosis not present

## 2020-08-25 DIAGNOSIS — Z79899 Other long term (current) drug therapy: Secondary | ICD-10-CM | POA: Insufficient documentation

## 2020-08-25 DIAGNOSIS — I10 Essential (primary) hypertension: Secondary | ICD-10-CM

## 2020-08-25 HISTORY — DX: COVID-19: U07.1

## 2020-08-25 MED ORDER — ACETAMINOPHEN 500 MG PO TABS
1000.0000 mg | ORAL_TABLET | Freq: Once | ORAL | Status: AC
  Administered 2020-08-25: 1000 mg via ORAL
  Filled 2020-08-25: qty 2

## 2020-08-25 NOTE — Discharge Instructions (Signed)
Please return for any problem.  °

## 2020-08-25 NOTE — ED Triage Notes (Signed)
States her blood pressure has been elevated for several days spoke with her PCP yest and was told to come to the ED. C/o headache with occ. Dizziness and blurred vision

## 2020-08-25 NOTE — ED Notes (Signed)
Pt transported to CT ?

## 2020-08-25 NOTE — ED Notes (Signed)
Pt d/c home per MD order. Discharge summary reviewed with pt, pt verbalizes understanding. No s/s of acute distress noted. Ambulatory off unit.  °

## 2020-08-25 NOTE — ED Provider Notes (Addendum)
[facility] EMERGENCY DEPARTMENT Provider Note   CSN: [id number] Arrival date & time: 08/25/20  [id number]     History Chief Complaint  Patient presents with  . Hypertension    Mary Sanford is a 55 y.o. female.  55 year old female with prior medical history as detailed below presents for evaluation.  Patient with longstanding history of hypertension.  Patient reports that over the last 1 to 2 weeks she has noted mildly elevated blood pressures at home.  She is compliant with her current antihypertensives.  She discussed her findings with her regular primary care provider yesterday.  She was advised to come to the ED for evaluation.  She apparently has experienced intermittent dizziness and headache with her elevated blood pressures.  She reports that this headache and dizziness has been persistent over the last 2 weeks.  She denies associated nausea, vomiting, visual change, focal weakness, fever, or other acute complaint.  The history is provided by the patient and medical records.  Hypertension This is a chronic problem. The current episode started more than 1 week ago. The problem occurs constantly. The problem has not changed since onset.Pertinent negatives include no chest pain and no abdominal pain. Nothing aggravates the symptoms. Nothing relieves the symptoms.       Past Medical History:  Diagnosis Date  . Anxiety   . Arthritis    hands, left knee  . COVID   . Depression   . Family history of adverse reaction to anesthesia    one sister ponv and slow to awaken  . History of iron deficiency anemia   . History of kidney stones   . Hypertension    followed by pcp  (12-05-2019 pt had ETT 12-11-2011 , in epic, showed normal with no ischemia)  . Nephrolithiasis   . Panic disorder   . PMB (postmenopausal bleeding)   . Pre-diabetes    followed by pcp  . Seasonal allergies   . SUI (stress urinary incontinence, female)   . Uterine fibroid   . Wears glasses      Patient Active Problem List   Diagnosis Date Noted  . Positive ANA (antinuclear antibody) 06/20/2020  . Chronic pain of left thumb 06/20/2020  . Mild persistent asthma without complication [id number]  . Overweight (BMI 25.0-29.9) 06/14/2020  . Status post bariatric surgery 06/16/2019  . Vitamin D insufficiency 06/16/2019  . Cough 02/22/2019  . Low back pain 06/22/2018  . ASCUS pap with negative high risk HPV on 06/30/16 06/30/2016  . Depression with anxiety 12/28/2015  . Anemia, iron deficiency 09/17/2013  . Routine general medical examination at a health care facility 01/12/2013  . Allergic rhinitis 01/12/2013  . Constipation, chronic 10/04/2012  . Visit for screening mammogram 01/12/2012  . B12 deficiency anemia 01/09/2012  . Obesity (BMI 30-39.9) 01/09/2012  . Essential hypertension, benign 11/24/2011    Past Surgical History:  Procedure Laterality Date  . BUNIONECTOMY Bilateral   . CESAREAN SECTION  1989  . CYSTOSCOPY W/ RETROGRADES Left 06/28/2018   Procedure: CYSTOSCOPY WITH RETROGRADE PYELOGRAM LEFT STENT;  Surgeon: Ceasar Mons, MD;  Location: [facility];  Service: Urology;  Laterality: Left;  . CYSTOSCOPY/URETEROSCOPY/HOLMIUM LASER/STENT PLACEMENT Left 07/16/2018   Procedure: CYSTOSCOPY/URETEROSCOPY/HOLMIUM LASER/STENT EXCHANGE;  Surgeon: Ceasar Mons, MD;  Location: [facility];  Service: Urology;  Laterality: Left;  . DILATATION & CURETTAGE/HYSTEROSCOPY WITH MYOSURE N/A 12/13/2019   Procedure: DILATATION & CURETTAGE/HYSTEROSCOPY WITH MYOSURE;  Surgeon: Salvadore Dom, MD;  Location: [facility];  Service: Gynecology;  Laterality: N/A;  . DILITATION & CURRETTAGE/HYSTROSCOPY WITH NOVASURE ABLATION N/A 06/17/2013   Procedure: DILATATION & CURETTAGE/HYSTEROSCOPY WITH NOVASURE ABLATION;  Surgeon: Osborne Oman, MD;  Location: [facility];  Service: Gynecology;  Laterality: N/A;  . 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    Procedure: LAPAROSCOPIC ROUX-EN-Y GASTRIC BYPASS WITH UPPER ENDOSCOPY;  Surgeon: Greer Pickerel, MD;  Location: [facility];  Service: General;  Laterality: N/A;  . HEMORRHOIDECTOMY WITH HEMORRHOID BANDING  10/19/2012   [facility] [place] Hem ligation/pexy  . OPERATIVE ULTRASOUND N/A 12/13/2019   Procedure: OPERATIVE ULTRASOUND;  Surgeon: Salvadore Dom, MD;  Location[facility];  Service: Gynecology;  Laterality: N/A;  . TUBAL LIGATION Bilateral 2001  . WISDOM TOOTH EXTRACTION       OB History    Gravida  7   Para  5   Term  4   Preterm  1   AB  2   Living  5     SAB  0   IAB  2   Ectopic  0   Multiple  0   Live Births  5           Family History  Problem Relation Age of Onset  . Hypertension Mother   . Hypothyroidism Mother   . Rectal cancer Mother 77  . Colon cancer Mother 66  . Hypotension Sister   . Hypothyroidism Sister   . Diabetes Sister   . Other Sister        pacemaker  . Seizures Brother   . Hypothyroidism Sister   . Seizures Sister   . Anemia Sister   . Deep vein thrombosis Sister   . Seizures Paternal Grandfather   . Stroke Maternal Uncle        > 55  . Diabetes Maternal Grandmother   . Kidney failure Maternal Grandmother   . Heart attack Maternal Grandfather   . Other Paternal Grandmother        tumors throughout body  . Hypertension Maternal Uncle     Social History   Tobacco Use  . Smoking status: Light Tobacco Smoker    Years: 5.00    Types: Cigarettes    Last attempt to quit: 08/05/1999    Years since quitting: 21.0  . Smokeless tobacco: Never Used  . Tobacco comment: Occasionally  Vaping Use  . Vaping Use: Never used  Substance Use Topics  . Alcohol use: Yes    Alcohol/week: 4.0 standard drinks    Types: 4 Glasses of wine per week  . Drug use: No    Home Medications Prior to Admission medications   Medication Sig Start Date End Date Taking? Authorizing Provider  acetaminophen (TYLENOL) 500 MG tablet Take 500 mg  by mouth every 6 (six) hours as needed for headache or moderate pain.   Yes [provider]  albuterol (VENTOLIN HFA) 108 (90 Base) MCG/ACT inhaler Inhale 1-2 puffs into the lungs every 6 (six) hours as needed for wheezing or shortness of breath (for seasonal allergies). 06/15/20  Yes [name], MD  APPLE CIDER VINEGAR PO Take 2 tablets by mouth daily.   Yes [provider]  Cholecalciferol (VITAMIN D3) 25 MCG (1000 UT) CHEW Chew 2 tablets by mouth daily.   Yes [provider]  escitalopram (LEXAPRO) 20 MG tablet Take 1 tablet (20 mg total) by mouth daily. 04/17/20  Yes [name], MD  nebivolol (BYSTOLIC) 5 MG tablet Take 1 tablet by mouth once  daily 08/03/20  Yes [name], MD  phentermine 37.5 MG capsule Take 1 capsule (37.5 mg total) by mouth every morning. 06/14/20  Yes [name], MD  triamterene-hydrochlorothiazide [facility]) 37.5-25 MG tablet Take 1 tablet by mouth once daily 05/25/20  Yes [name], MD  budesonide-formoterol [facility]) 80-4.5 MCG/ACT inhaler Inhale 2 puffs into the lungs 2 (two) times daily. 06/14/20   [name], MD  Cholecalciferol 50 MCG (2000 UT) TABS Take 1 tablet (2,000 Units total) by mouth daily. Patient not taking: No sig reported 06/15/20   [name], MD  terbinafine (LAMISIL) 250 MG tablet Take 1 tablet (250 mg total) by mouth daily. Patient not taking: No sig reported 05/28/20   [name], MD  traMADol (ULTRAM) 50 MG tablet Take 1 tablet (50 mg total) by mouth every 8 (eight) hours as needed for severe pain. Patient not taking: No sig reported 06/08/20   Gregor Hams, MD  potassium chloride (K-DUR) 10 MEQ tablet Take 1 tablet (10 mEq total) by mouth 2 (two) times daily. 05/13/12 06/27/13  [name], MD    Allergies    Naproxen  Review of Systems   Review of Systems  Cardiovascular: Negative for chest pain.  Gastrointestinal: Negative for abdominal pain.  All other systems  reviewed and are negative.   Physical Exam Updated Vital Signs BP (!) 146/96   Pulse 78   Temp 98.4 F (36.9 C) (Oral)   Resp 12   Ht 5\' 9"  (1.753 m)   Wt 90.7 kg   SpO2 100%   BMI 29.53 kg/m   Physical Exam Vitals and nursing note reviewed.  Constitutional:      General: She is not in acute distress.    Appearance: Normal appearance. She is well-developed and well-nourished.  HENT:     Head: Normocephalic and atraumatic.     Nose: Nose normal.     Mouth/Throat:     Mouth: Oropharynx is clear and moist. Mucous membranes are moist.  Eyes:     Extraocular Movements: EOM normal.     Conjunctiva/sclera: Conjunctivae normal.     Pupils: Pupils are equal, round, and reactive to light.  Cardiovascular:     Rate and Rhythm: Normal rate and regular rhythm.     Heart sounds: Normal heart sounds.  Pulmonary:     Effort: Pulmonary effort is normal. No respiratory distress.     Breath sounds: Normal breath sounds.  Abdominal:     General: There is no distension.     Palpations: Abdomen is soft.     Tenderness: There is no abdominal tenderness.  Musculoskeletal:        General: No deformity or edema. Normal range of motion.     Cervical back: Normal range of motion and neck supple.  Skin:    General: Skin is warm and dry.  Neurological:     General: No focal deficit present.     Mental Status: She is alert and oriented to person, place, and time. Mental status is at baseline.     Cranial Nerves: No cranial nerve deficit.     Sensory: No sensory deficit.     Motor: No weakness.     Coordination: Coordination normal.     Gait: Gait normal.  Psychiatric:        Mood and Affect: Mood and affect normal.     ED Results / Procedures / Treatments   Labs (all labs ordered are listed, but  only abnormal results are displayed) Labs Reviewed - No data to display  EKG None  Radiology No results found.  Procedures Procedures (including critical care time)  Medications  Ordered in ED Medications  acetaminophen (TYLENOL) tablet 1,000 mg (1,000 mg Oral Given 08/25/20 1203)    ED Course  I have reviewed the triage vital signs and the nursing notes.  Pertinent labs & imaging results that were available during my care of the patient were reviewed by me and considered in my medical decision making (see chart for details).    MDM Rules/Calculators/A&P                          MDM  Screen complete  VADIE PRINCIPATO was evaluated in Emergency Department on 08/25/2020 for the symptoms described in the history of present illness. She was evaluated in the context of the global COVID-19 pandemic, which necessitated consideration that the patient might be at risk for infection with the SARS-CoV-2 virus that causes COVID-19. Institutional protocols and algorithms that pertain to the evaluation of patients at risk for COVID-19 are in a state of rapid change based on information released by regulatory bodies including the CDC and federal and state organizations. These policies and algorithms were followed during the patient's care in the ED.  Patient is presenting with complaint of mildly elevated blood pressures.  Patient is currently on antihypertensives -dosages has not changed recently.  Patient reports that over the last 2 weeks she has noticed mildly elevated BPs at home.  She denies any specific complaint.  She does report mild intermittent headache with mild intermittent dizziness.  She is currently otherwise asymptomatic.  She was advised to come to the ED for evaluation yesterday by her Pcp.  Exam and work-up today are without significant findings.  Patient appears to be appropriate for outpatient management.  Importance of close follow-up is stressed.  Strict return precautions given and understood.   Final Clinical Impression(s) / ED Diagnoses Final diagnoses:  Hypertension, unspecified type    Rx / DC Orders ED Discharge Orders    None        Valarie Merino, MD 08/25/20 1302    Valarie Merino, MD 08/25/20 1302

## 2020-08-28 ENCOUNTER — Other Ambulatory Visit: Payer: Self-pay | Admitting: Internal Medicine

## 2020-08-28 DIAGNOSIS — Z1231 Encounter for screening mammogram for malignant neoplasm of breast: Secondary | ICD-10-CM

## 2020-08-30 DIAGNOSIS — Z9884 Bariatric surgery status: Secondary | ICD-10-CM | POA: Diagnosis not present

## 2020-08-30 DIAGNOSIS — Z8639 Personal history of other endocrine, nutritional and metabolic disease: Secondary | ICD-10-CM | POA: Diagnosis not present

## 2020-08-30 DIAGNOSIS — R1013 Epigastric pain: Secondary | ICD-10-CM | POA: Diagnosis not present

## 2020-09-04 ENCOUNTER — Ambulatory Visit: Payer: Federal, State, Local not specified - PPO | Admitting: Internal Medicine

## 2020-09-06 ENCOUNTER — Ambulatory Visit
Admission: RE | Admit: 2020-09-06 | Discharge: 2020-09-06 | Disposition: A | Discharge status: Home / Self Care | Payer: Federal, State, Local not specified - PPO | Source: Ambulatory Visit

## 2020-09-06 ENCOUNTER — Other Ambulatory Visit: Payer: Self-pay

## 2020-09-06 DIAGNOSIS — Z1231 Encounter for screening mammogram for malignant neoplasm of breast: Secondary | ICD-10-CM

## 2020-09-07 ENCOUNTER — Encounter: Payer: Self-pay | Admitting: Internal Medicine

## 2020-09-07 ENCOUNTER — Ambulatory Visit: Payer: Federal, State, Local not specified - PPO | Admitting: Internal Medicine

## 2020-09-07 DIAGNOSIS — I10 Essential (primary) hypertension: Secondary | ICD-10-CM | POA: Diagnosis not present

## 2020-09-07 LAB — HM MAMMOGRAPHY

## 2020-09-07 MED ORDER — TRIAMTERENE-HCTZ 37.5-25 MG PO TABS: 1.0000 | ORAL_TABLET | Freq: Every day | ORAL | 1 refills | Status: DC

## 2020-09-07 NOTE — Progress Notes (Signed)
Subjective:  Patient ID: Mary Sanford, female    DOB: 1965-09-01  Age: 55 y.o. MRN: [id number]  CC: Hypertension  This visit occurred during the SARS-CoV-2 public health emergency.  Safety protocols were in place, including screening questions prior to the visit, additional usage of staff PPE, and extensive cleaning of exam room while observing appropriate contact time as indicated for disinfecting solutions.    HPI MIAISABELLA BACORN presents for f/up - She recently experienced an elevation in her blood pressure associated with headache and blurred vision.  She was seen in the ED and a work-up was unremarkable.  She does not know why her blood pressure was elevated.  Since then she tells me her blood pressure has been well controlled and she has felt well and offers no complaints.  Outpatient Medications Prior to Visit  Medication Sig Dispense Refill  . acetaminophen (TYLENOL) 500 MG tablet Take 500 mg by mouth every 6 (six) hours as needed for headache or moderate pain.    Marland Kitchen albuterol (VENTOLIN HFA) 108 (90 Base) MCG/ACT inhaler Inhale 1-2 puffs into the lungs every 6 (six) hours as needed for wheezing or shortness of breath (for seasonal allergies). 18 g 3  . APPLE CIDER VINEGAR PO Take 2 tablets by mouth daily.    . Cholecalciferol (VITAMIN D3) 25 MCG (1000 UT) CHEW Chew 2 tablets by mouth daily.    Marland Kitchen escitalopram (LEXAPRO) 20 MG tablet Take 1 tablet (20 mg total) by mouth daily. 90 tablet 1  . nebivolol (BYSTOLIC) 5 MG tablet Take 1 tablet by mouth once daily 90 tablet 1  . phentermine 37.5 MG capsule Take 1 capsule (37.5 mg total) by mouth every morning. 30 capsule 2  . triamterene-hydrochlorothiazide (MAXZIDE-25) 37.5-25 MG tablet Take 1 tablet by mouth once daily 90 tablet 0  . budesonide-formoterol (SYMBICORT) 80-4.5 MCG/ACT inhaler Inhale 2 puffs into the lungs 2 (two) times daily. 3 each 1  . Cholecalciferol 50 MCG (2000 UT) TABS Take 1 tablet (2,000 Units total) by mouth daily.  (Patient not taking: No sig reported) 90 tablet 1  . terbinafine (LAMISIL) 250 MG tablet Take 1 tablet (250 mg total) by mouth daily. (Patient not taking: No sig reported) 90 tablet 0  . traMADol (ULTRAM) 50 MG tablet Take 1 tablet (50 mg total) by mouth every 8 (eight) hours as needed for severe pain. (Patient not taking: No sig reported) 15 tablet 0   No facility-administered medications prior to visit.    ROS Review of Systems  Constitutional: Negative.  Negative for diaphoresis and fatigue.  HENT: Negative.   Eyes: Negative.  Negative for visual disturbance.  Respiratory: Negative for chest tightness, shortness of breath and wheezing.   Cardiovascular: Negative for chest pain, palpitations and leg swelling.  Gastrointestinal: Negative for abdominal pain, diarrhea and nausea.  Endocrine: Negative.   Genitourinary: Negative.  Negative for difficulty urinating.  Musculoskeletal: Negative.   Skin: Negative.   Neurological: Negative.  Negative for dizziness, weakness, light-headedness, numbness and headaches.  Hematological: Negative for adenopathy. Does not bruise/bleed easily.  Psychiatric/Behavioral: Negative.     Objective:  BP 136/86   Pulse 72   Temp 98.6 F (37 C) (Oral)   Ht 5\' 9"  (1.753 m)   Wt 200 lb (90.7 kg)   SpO2 97%   BMI 29.53 kg/m   BP Readings from Last 3 Encounters:  09/07/20 136/86  08/25/20 (!) 146/96  06/20/20 115/80    Wt Readings from Last 3 Encounters:  09/07/20  200 lb (90.7 kg)  08/25/20 200 lb (90.7 kg)  06/20/20 201 lb 9.6 oz (91.4 kg)    Physical Exam Vitals reviewed.  Constitutional:      Appearance: Normal appearance.  HENT:     Nose: Nose normal.     Mouth/Throat:     Mouth: Mucous membranes are moist.  Eyes:     General: No scleral icterus.    Extraocular Movements: Extraocular movements intact.     Conjunctiva/sclera: Conjunctivae normal.     Pupils: Pupils are equal, round, and reactive to light.  Cardiovascular:     Rate  and Rhythm: Normal rate and regular rhythm.     Heart sounds: No murmur heard.   Pulmonary:     Effort: Pulmonary effort is normal.     Breath sounds: No stridor. No wheezing, rhonchi or rales.  Abdominal:     General: Abdomen is flat. Bowel sounds are normal. There is no distension.     Palpations: Abdomen is soft. There is no hepatomegaly, splenomegaly or mass.     Tenderness: There is no abdominal tenderness.  Musculoskeletal:        General: Normal range of motion.     Cervical back: Neck supple.     Right lower leg: No edema.     Left lower leg: No edema.  Lymphadenopathy:     Cervical: No cervical adenopathy.  Skin:    General: Skin is warm and dry.     Coloration: Skin is not pale.  Neurological:     General: No focal deficit present.     Mental Status: She is alert.  Psychiatric:        Mood and Affect: Mood normal.        Behavior: Behavior normal.     Lab Results  Component Value Date   WBC 5.8 06/14/2020   HGB 13.6 06/14/2020   HCT 42.9 06/14/2020   PLT 248.0 06/14/2020   GLUCOSE 95 06/14/2020   CHOL 120 06/14/2020   TRIG 84.0 06/14/2020   HDL 67.70 06/14/2020   LDLCALC 36 06/14/2020   ALT 28 12/13/2019   AST 24 12/13/2019   NA 137 06/14/2020   K 4.3 06/14/2020   CL 101 06/14/2020   CREATININE 0.77 06/14/2020   BUN 18 06/14/2020   CO2 31 06/14/2020   TSH 0.64 06/14/2020   HGBA1C 6.1 06/14/2020    No results found.  Assessment & Plan:   Louis was seen today for hypertension.  Diagnoses and all orders for this visit:  Essential hypertension, benign- Her blood pressure is now well controlled and she is asymptomatic.  She will continue to work on her lifestyle modifications and will continue the combination of triamterene, hydrochlorothiazide, and nebivolol. -     triamterene-hydrochlorothiazide (MAXZIDE-25) 37.5-25 MG tablet; Take 1 tablet by mouth daily.   I have discontinued Eliseo Gum. Pittinger's terbinafine, traMADol, budesonide-formoterol, and  Cholecalciferol. I have also changed her triamterene-hydrochlorothiazide. Additionally, I am having her maintain her escitalopram, phentermine, albuterol, nebivolol, acetaminophen, Vitamin D3, and APPLE CIDER VINEGAR PO.  Meds ordered this encounter  Medications  . triamterene-hydrochlorothiazide (MAXZIDE-25) 37.5-25 MG tablet    Sig: Take 1 tablet by mouth daily.    Dispense:  90 tablet    Refill:  1     Follow-up: No follow-ups on file.  Scarlette Calico, MD

## 2020-09-08 ENCOUNTER — Encounter: Payer: Self-pay | Admitting: Internal Medicine

## 2020-09-08 NOTE — Patient Instructions (Signed)

## 2020-09-27 ENCOUNTER — Encounter: Payer: Self-pay | Admitting: Gastroenterology

## 2020-10-05 DIAGNOSIS — R1013 Epigastric pain: Secondary | ICD-10-CM | POA: Diagnosis not present

## 2020-10-05 DIAGNOSIS — K289 Gastrojejunal ulcer, unspecified as acute or chronic, without hemorrhage or perforation: Secondary | ICD-10-CM | POA: Diagnosis not present

## 2020-10-05 DIAGNOSIS — Z9884 Bariatric surgery status: Secondary | ICD-10-CM | POA: Diagnosis not present

## 2020-10-09 ENCOUNTER — Telehealth: Payer: Self-pay | Admitting: Gastroenterology

## 2020-10-09 DIAGNOSIS — R1013 Epigastric pain: Secondary | ICD-10-CM

## 2020-10-09 NOTE — Telephone Encounter (Signed)
I received a note from Dr. Redmond Pulling of general surgery about this patient.  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  She has been having epigastric pain and he is concerned about marginal ulcer.  She is on PPI and Carafate at this time.  He was asking to coordinate EGD at the same time as her colonoscopy which is scheduled for April.  [place] can you please contact the patient to see if we can add an EGD to the time of her colonoscopy.  Alternatively if she wants to have the EGD done sooner than her colonoscopy, we have openings this week if she is having discomfort and wants this evaluated sooner.  Can you please schedule her?  Thanks

## 2020-10-09 NOTE — Telephone Encounter (Signed)
Spoke with patient, she wanted to have the EGD as soon as possible. Patient is scheduled for an EGD on Thursday 10/11/20 at 1:30 PM, patient will need to arrive at 12:30 PM with a care partner. Patient is aware that I will send her the instructions via My Chart for her review.   Ambulatory referral in epic for EGD in the [place].

## 2020-10-11 ENCOUNTER — Encounter: Payer: Self-pay | Admitting: Gastroenterology

## 2020-10-11 ENCOUNTER — Other Ambulatory Visit: Payer: Self-pay

## 2020-10-11 ENCOUNTER — Ambulatory Visit (AMBULATORY_SURGERY_CENTER): Payer: Federal, State, Local not specified - PPO | Admitting: Gastroenterology

## 2020-10-11 VITALS — BP 137/95 | HR 70 | Temp 97.4°F | Resp 18 | Ht 69.0 in | Wt 200.0 lb

## 2020-10-11 DIAGNOSIS — K2970 Gastritis, unspecified, without bleeding: Secondary | ICD-10-CM | POA: Diagnosis not present

## 2020-10-11 DIAGNOSIS — Z9884 Bariatric surgery status: Secondary | ICD-10-CM

## 2020-10-11 DIAGNOSIS — B9681 Helicobacter pylori [H. pylori] as the cause of diseases classified elsewhere: Secondary | ICD-10-CM

## 2020-10-11 DIAGNOSIS — R1013 Epigastric pain: Secondary | ICD-10-CM

## 2020-10-11 DIAGNOSIS — K2950 Unspecified chronic gastritis without bleeding: Secondary | ICD-10-CM | POA: Diagnosis not present

## 2020-10-11 DIAGNOSIS — R109 Unspecified abdominal pain: Secondary | ICD-10-CM | POA: Diagnosis not present

## 2020-10-11 HISTORY — PX: ESOPHAGOGASTRODUODENOSCOPY (EGD) WITH PROPOFOL: SHX5813

## 2020-10-11 HISTORY — PX: UPPER GASTROINTESTINAL ENDOSCOPY: SHX188

## 2020-10-11 MED ORDER — SODIUM CHLORIDE 0.9 % IV SOLN: 500.0000 mL | Freq: Once | INTRAVENOUS | Status: DC

## 2020-10-11 NOTE — Progress Notes (Signed)
Called to room to assist during endoscopic procedure.  Patient ID and intended procedure confirmed with present staff. Received instructions for my participation in the procedure from the performing physician.  

## 2020-10-11 NOTE — Op Note (Signed)
[facility] Patient Name: Mary Sanford Procedure Date: 10/11/2020 1:10 PM MRN: [id number] Endoscopist: Remo Lipps P. Havery Moros , MD Age: 55 Referring MD:  Date of Birth: 06-21-1966 Gender: Female Account #: [id number] Procedure:                Upper GI endoscopy Indications:              Epigastric abdominal pain, Status post Roux-en-Y,                            no improvement with trial of PPI Medicines:                Monitored Anesthesia Care Procedure:                Pre-Anesthesia Assessment:                           - Prior to the procedure, a History and Physical                            was performed, and patient medications and                            allergies were reviewed. The patient's tolerance of                            previous anesthesia was also reviewed. The risks                            and benefits of the procedure and the sedation                            options and risks were discussed with the patient.                            All questions were answered, and informed consent                            was obtained. Prior Anticoagulants: The patient has                            taken no previous anticoagulant or antiplatelet                            agents. ASA Grade Assessment: II - A patient with                            mild systemic disease. After reviewing the risks                            and benefits, the patient was deemed in                            satisfactory condition to undergo the procedure.  After obtaining informed consent, the endoscope was                            passed under direct vision. Throughout the                            procedure, the patient's blood pressure, pulse, and                            oxygen saturations were monitored continuously. The                            Endoscope was introduced through the mouth, and                            advanced to the  jejunum. The upper GI endoscopy was                            accomplished without difficulty. The patient                            tolerated the procedure well. Scope In: Scope Out: Findings:                 Esophagogastric landmarks were identified: the                            Z-line was found at 39 cm, the gastroesophageal                            junction was found at 39 cm and the upper extent of                            the gastric folds was found at 39 cm from the                            incisors.                           The exam of the esophagus was otherwise normal.                           Evidence of a Roux-en-Y gastrojejunostomy was                            found. The gastrojejunal anastomosis was                            characterized by healthy appearing mucosa. No                            ulcerations were noted. A protruding staple was                            noted and removed  with forceps.                           The exam of the stomach was otherwise normal.                           Biopsies were taken with a cold forceps for                            Helicobacter pylori testing.                           The examined small bowel limb was normal. Complications:            No immediate complications. Estimated blood loss:                            Minimal. Estimated Blood Loss:     Estimated blood loss was minimal. Impression:               - Esophagogastric landmarks identified.                           - Roux-en-Y gastrojejunostomy with gastrojejunal                            anastomosis characterized by healthy appearing                            mucosa. Protruding staple removed.                           - Normal gastric pouch otherwise - no marginal                            ulcer. Biopsies taken to rule out H pylori                           - Normal examined small bowel limb.                           No overt abnormality on this  exam to account for                            symptoms. Recommendation:           - Patient has a contact number available for                            emergencies. The signs and symptoms of potential                            delayed complications were discussed with the                            patient. Return to normal activities tomorrow.  Written discharge instructions were provided to the                            patient.                           - Resume previous diet.                           - Continue present medications.                           - Await pathology results.                           - Recommend RUQ [place] to rule out gallstones if not                            yet done [employer]. Chett Taniguchi, MD 10/11/2020 1:59:25 PM This report has been signed electronically.

## 2020-10-11 NOTE — Patient Instructions (Addendum)
Read all handouts given to  You your recovery room nurse.  Someone from the office will call you to schedule an ultrasound.  YOU HAD AN ENDOSCOPIC PROCEDURE TODAY AT [facility]:   Refer to the procedure report that was given to you for any specific questions about what was found during the examination.  If the procedure report does not answer your questions, please call your gastroenterologist to clarify.  If you requested that your care partner not be given the details of your procedure findings, then the procedure report has been included in a sealed envelope for you to review at your convenience later.  YOU SHOULD EXPECT: Some feelings of bloating in the abdomen. Passage of more gas than usual.  Walking can help get rid of the air that was put into your GI tract during the procedure and reduce the bloating.   Please Note:  You might notice some irritation and congestion in your nose or some drainage.  This is from the oxygen used during your procedure.  There is no need for concern and it should clear up in a day or so.  SYMPTOMS TO REPORT IMMEDIATELY:    Following upper endoscopy (EGD)  You may have some throat irritation.         Use salt water gargles for relief or/and throat spray.  If it gets worse, call us.  Vomiting of blood or coffee ground material  New chest pain or pain under the shoulder blades  Painful or persistently difficult swallowing  New shortness of breath  Fever of 100F or higher  Black, tarry-looking stools  For urgent or emergent issues, a gastroenterologist can be reached at any hour by calling [phone]. Do not use MyChart messaging for urgent concerns.    DIET:  We do recommend a small meal at first, but then you may proceed to your regular diet.  Drink plenty of fluids but you should avoid alcoholic beverages for 24 hours.  ACTIVITY:  You should plan to take it easy for the rest of today and you should NOT DRIVE or use heavy machinery  until tomorrow (because of the sedation medicines used during the test).    FOLLOW UP: Our staff will call the number listed on your records 48-72 hours following your procedure to check on you and address any questions or concerns that you may have regarding the information given to you following your procedure. If we do not reach you, we will leave a message.  We will attempt to reach you two times.  During this call, we will ask if you have developed any symptoms of COVID 19. If you develop any symptoms (ie: fever, flu-like symptoms, shortness of breath, cough etc.) before then, please call [phone].  If you test positive for Covid 19 in the 2 weeks post procedure, please call and report this information to [place].    If any biopsies were taken you will be contacted by phone or by letter within the next 1-3 weeks.  Please call us at [phone] if you have not heard about the biopsies in 3 weeks.    SIGNATURES/CONFIDENTIALITY: You and/or your care partner have signed paperwork which will be entered into your electronic medical record.  These signatures attest to the fact that that the information above on your After Visit Summary has been reviewed and is understood.  Full responsibility of the confidentiality of this discharge information lies with you and/or your care-partner.

## 2020-10-11 NOTE — Progress Notes (Signed)
A/ox3, pleased with MAC, report to RN 

## 2020-10-12 ENCOUNTER — Telehealth: Payer: Self-pay

## 2020-10-12 DIAGNOSIS — R1013 Epigastric pain: Secondary | ICD-10-CM

## 2020-10-12 NOTE — Telephone Encounter (Signed)
Patient has been scheduled for a RUQ [place] at [facility] on Monday, 10/22/20 at 10 AM, arrive at 9:45 AM. NPO 6 hours prior.   Spoke with patient, she is aware of the [place] appointment and instructions. Patient advised that I will send her appt information via My Chart as well. Patient verbalized understanding and had no concerns at the end of the call.

## 2020-10-12 NOTE — Telephone Encounter (Signed)
-----   Message from Yetta Flock, MD sent at 10/11/2020  6:09 PM EST ----- Regarding: RUQ [place] Mary Sanford can you please contact this patient and coordinate a RUQ [place] - rule out gallstones. Thank you!  Dr. Loni Muse

## 2020-10-16 ENCOUNTER — Telehealth: Payer: Self-pay

## 2020-10-16 NOTE — Telephone Encounter (Signed)
  Follow up Call-  Call back number 10/11/2020  Post procedure Call Back phone  # [phone]  Permission to leave phone message Yes  Some recent data might be hidden     Patient questions:  Do you have a fever, pain , or abdominal swelling? No. Pain Score  0 *  Have you tolerated food without any problems? Yes.    Have you been able to return to your normal activities? Yes.    Do you have any questions about your discharge instructions: Diet   No. Medications  No. Follow up visit  No.  Do you have questions or concerns about your Care? No.  Actions: * If pain score is 4 or above: No action needed, pain <4.  1. Have you developed a fever since your procedure? n  2.   Have you had an respiratory symptoms (SOB or cough) since your procedure? no  3.   Have you tested positive for COVID 19 since your procedure no  4.   Have you had any family members/close contacts diagnosed with the COVID 19 since your procedure?  no   If yes to any of these questions please route to Joylene John, RN and Joella Prince, RN

## 2020-10-17 ENCOUNTER — Other Ambulatory Visit: Payer: Self-pay

## 2020-10-17 ENCOUNTER — Ambulatory Visit (AMBULATORY_SURGERY_CENTER): Payer: Self-pay

## 2020-10-17 VITALS — Ht 69.0 in | Wt 205.0 lb

## 2020-10-17 DIAGNOSIS — Z800 Family history of malignant neoplasm of digestive organs: Secondary | ICD-10-CM

## 2020-10-17 DIAGNOSIS — Z860100 Personal history of colon polyps, unspecified: Secondary | ICD-10-CM

## 2020-10-17 MED ORDER — NA SULFATE-K SULFATE-MG SULF 17.5-3.13-1.6 GM/177ML PO SOLN: 1.0000 | Freq: Once | ORAL | 0 refills | Status: AC

## 2020-10-17 NOTE — Progress Notes (Signed)
No allergies to soy or egg Pt is not on blood thinners  Denies issues with sedation/intubation Denies atrial flutter/fib Denies constipation   Emmi instructions given to pt  Pt is aware of Covid safety and care partner requirements.   Is not taking Phentermine any longer.

## 2020-10-22 ENCOUNTER — Other Ambulatory Visit: Payer: Self-pay

## 2020-10-22 ENCOUNTER — Ambulatory Visit (HOSPITAL_COMMUNITY)
Admission: RE | Admit: 2020-10-22 | Discharge: 2020-10-22 | Disposition: A | Discharge status: Home / Self Care | Payer: Federal, State, Local not specified - PPO | Source: Ambulatory Visit | Attending: Gastroenterology | Admitting: Gastroenterology

## 2020-10-22 DIAGNOSIS — A048 Other specified bacterial intestinal infections: Secondary | ICD-10-CM

## 2020-10-22 DIAGNOSIS — R1013 Epigastric pain: Secondary | ICD-10-CM | POA: Diagnosis not present

## 2020-10-22 DIAGNOSIS — K760 Fatty (change of) liver, not elsewhere classified: Secondary | ICD-10-CM | POA: Diagnosis not present

## 2020-10-22 DIAGNOSIS — R109 Unspecified abdominal pain: Secondary | ICD-10-CM | POA: Diagnosis not present

## 2020-10-22 MED ORDER — OMEPRAZOLE 40 MG PO CPDR: 40.0000 mg | DELAYED_RELEASE_CAPSULE | Freq: Two times a day (BID) | ORAL | 0 refills | Status: DC

## 2020-10-22 MED ORDER — AMOXICILLIN 500 MG PO TABS: 1000.0000 mg | ORAL_TABLET | Freq: Two times a day (BID) | ORAL | 0 refills | Status: AC

## 2020-10-22 MED ORDER — CLARITHROMYCIN 500 MG PO TABS: 500.0000 mg | ORAL_TABLET | Freq: Two times a day (BID) | ORAL | 0 refills | Status: AC

## 2020-10-22 MED ORDER — METRONIDAZOLE 500 MG PO TABS: 500.0000 mg | ORAL_TABLET | Freq: Two times a day (BID) | ORAL | 0 refills | Status: AC

## 2020-10-26 ENCOUNTER — Telehealth: Payer: Self-pay

## 2020-10-26 NOTE — Telephone Encounter (Signed)
error 

## 2020-10-26 NOTE — Telephone Encounter (Signed)
Great thank you!

## 2020-10-26 NOTE — Telephone Encounter (Signed)
Notified patient of result from Dr. Havery Moros , patient expressed understanding and agreement, no further questions at this time. Patient states her sx had improved and will call if something changes.

## 2020-10-26 NOTE — Telephone Encounter (Signed)
-----   Message from Yetta Flock, MD sent at 10/25/2020  4:50 PM EDT ----- Mary Sella can you please help relay the following: - US shows NO evidence of gallstones, good news - mild fatty liver noted, her liver enzymes have been normal in the past but this needs to be monitored - I found H pylori during EGD and treated her for that. Can you ask if that has helped her symptoms, or do they still persist? Thanks

## 2020-10-29 ENCOUNTER — Other Ambulatory Visit: Payer: Self-pay

## 2020-10-29 ENCOUNTER — Ambulatory Visit: Payer: Self-pay

## 2020-10-29 ENCOUNTER — Ambulatory Visit (INDEPENDENT_AMBULATORY_CARE_PROVIDER_SITE_OTHER): Payer: Federal, State, Local not specified - PPO | Admitting: Family Medicine

## 2020-10-29 ENCOUNTER — Encounter: Payer: Self-pay | Admitting: Family Medicine

## 2020-10-29 VITALS — BP 114/78 | HR 78 | Ht 69.0 in | Wt 205.8 lb

## 2020-10-29 DIAGNOSIS — M25531 Pain in right wrist: Secondary | ICD-10-CM

## 2020-10-29 NOTE — Progress Notes (Signed)
I, [name], LAT, ATC, am serving as scribe for Dr. Lynne Leader.  Mary Sanford is a 55 y.o. female who presents to [place] at [facility] today for R thumb pain. Pt was last seen by Dr. Georgina Snell on 06/08/20 and was given a L 1st [facility] joint steroid injection, thumb spica splint, limited tramadol, and given a rheumatologic work up. Today, pt reports R thumb x approximately one week w/ no new MOI.  Aggravating factors include typing, holding a pen, gripping and R thumb circumduction.  She states that she has some mild swelling at her R radial wrist.  Dx testing: 06/08/20 L hand XR  06/08/20, 06/14/20, & 06/20/20 labs  Pertinent review of systems: No fevers or chills  Relevant historical information: Hypertension, history of positive ANA however has been evaluated by rheumatology and thought not to have rheumatologic disease.   Exam:  BP 114/78 (BP Location: Left Arm, Patient Position: Sitting, Cuff Size: Normal)   Pulse 78   Ht 5\' 9"  (1.753 m)   Wt 205 lb 12.8 oz (93.4 kg)   SpO2 97%   BMI 30.39 kg/m  General: Well Developed, well nourished, and in no acute distress.   MSK: Right hand slight swelling at first [facility] otherwise normal-appearing hand and wrist. Normal hand and wrist motion. No pain with resisted wrist motion.  Some pain with thumb motion. Negative Finkelstein's test. Pulses capillary fill and sensation are intact distally.    Lab and Radiology Results  Procedure: Real-time Ultrasound Guided Injection of right hand first [facility] Device: Philips Affiniti 50G Images permanently stored and available for review in PACS Verbal informed consent obtained.  Discussed risks and benefits of procedure. Warned about infection bleeding damage to structures skin hypopigmentation and fat atrophy among others. Patient expresses understanding and agreement Time-out conducted.   Noted no overlying erythema, induration, or other signs of local infection.   Skin prepped  in a sterile fashion.   Local anesthesia: Topical Ethyl chloride.   With sterile technique and under real time ultrasound guidance:  40 mg of Kenalog and 1 mL of lidocaine injected into the first [facility] joint. Fluid seen entering the joint capsule.   Completed without difficulty   Pain immediately resolved suggesting accurate placement of the medication.   Advised to call if fevers/chills, erythema, induration, drainage, or persistent bleeding.   Images permanently stored and available for review in the ultrasound unit.  Impression: Technically successful ultrasound guided injection.         Assessment and Plan: 55 y.o. 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  Possible rheumatologic process although this has been evaluated previously with rheumatology and thought to be nonrheumatologic.  Discussed options.  Plan for steroid injection first Donna.  Also recommend Voltaren gel.  Recheck in 6 weeks.  Work note provided today.   PDMP not reviewed this encounter. Orders Placed This Encounter  Procedures  . [place] LIMITED JOINT SPACE STRUCTURES UP RIGHT(NO LINKED CHARGES)    Order Specific Question:   Reason for Exam (SYMPTOM  OR DIAGNOSIS REQUIRED)    Answer:   R wrist pain    Order Specific Question:   Preferred imaging location?    Answer:   [place]   No orders of the defined types were placed in this encounter.    Discussed warning signs or symptoms. Please see discharge instructions. Patient expresses understanding.   The above documentation has been reviewed and is accurate and  complete Lynne Leader, M.D.

## 2020-10-29 NOTE — Patient Instructions (Signed)
Thank you for coming in today.  Call or go to the ER if you develop a large red swollen joint with extreme pain or oozing puss.   Please use voltaren gel up to 4x daily for pain as needed.   Let me know if not better.   Recheck in 6 weeks.

## 2020-11-05 ENCOUNTER — Encounter: Payer: Self-pay | Admitting: Gastroenterology

## 2020-11-05 ENCOUNTER — Other Ambulatory Visit: Payer: Self-pay

## 2020-11-05 ENCOUNTER — Ambulatory Visit (AMBULATORY_SURGERY_CENTER): Payer: Federal, State, Local not specified - PPO | Admitting: Gastroenterology

## 2020-11-05 VITALS — BP 117/82 | HR 73 | Temp 96.2°F | Resp 20 | Ht 69.0 in | Wt 205.0 lb

## 2020-11-05 DIAGNOSIS — D124 Benign neoplasm of descending colon: Secondary | ICD-10-CM

## 2020-11-05 DIAGNOSIS — Z86010 Personal history of colonic polyps: Secondary | ICD-10-CM | POA: Diagnosis not present

## 2020-11-05 DIAGNOSIS — D123 Benign neoplasm of transverse colon: Secondary | ICD-10-CM

## 2020-11-05 DIAGNOSIS — Z800 Family history of malignant neoplasm of digestive organs: Secondary | ICD-10-CM | POA: Diagnosis not present

## 2020-11-05 DIAGNOSIS — D122 Benign neoplasm of ascending colon: Secondary | ICD-10-CM | POA: Diagnosis not present

## 2020-11-05 DIAGNOSIS — Z1211 Encounter for screening for malignant neoplasm of colon: Secondary | ICD-10-CM | POA: Diagnosis not present

## 2020-11-05 HISTORY — PX: COLONOSCOPY WITH PROPOFOL: SHX5780

## 2020-11-05 LAB — HM COLONOSCOPY

## 2020-11-05 MED ORDER — SODIUM CHLORIDE 0.9 % IV SOLN: 500.0000 mL | Freq: Once | INTRAVENOUS | Status: DC

## 2020-11-05 NOTE — Patient Instructions (Signed)
YOU HAD AN ENDOSCOPIC PROCEDURE TODAY AT THE  ENDOSCOPY CENTER:   Refer to the procedure report that was given to you for any specific questions about what was found during the examination.  If the procedure report does not answer your questions, please call your gastroenterologist to clarify.  If you requested that your care partner not be given the details of your procedure findings, then the procedure report has been included in a sealed envelope for you to review at your convenience later.  YOU SHOULD EXPECT: Some feelings of bloating in the abdomen. Passage of more gas than usual.  Walking can help get rid of the air that was put into your GI tract during the procedure and reduce the bloating. If you had a lower endoscopy (such as a colonoscopy or flexible sigmoidoscopy) you may notice spotting of blood in your stool or on the toilet paper. If you underwent a bowel prep for your procedure, you may not have a normal bowel movement for a few days.  Please Note:  You might notice some irritation and congestion in your nose or some drainage.  This is from the oxygen used during your procedure.  There is no need for concern and it should clear up in a day or so.  SYMPTOMS TO REPORT IMMEDIATELY:   Following lower endoscopy (colonoscopy or flexible sigmoidoscopy):  Excessive amounts of blood in the stool  Significant tenderness or worsening of abdominal pains  Swelling of the abdomen that is new, acute  Fever of 100F or higher   Following upper endoscopy (EGD)  Vomiting of blood or coffee ground material  New chest pain or pain under the shoulder blades  Painful or persistently difficult swallowing  New shortness of breath  Fever of 100F or higher  Black, tarry-looking stools  For urgent or emergent issues, a gastroenterologist can be reached at any hour by calling [phone]. Do not use MyChart messaging for urgent concerns.    DIET:  We do recommend a small meal at first, but  then you may proceed to your regular diet.  Drink plenty of fluids but you should avoid alcoholic beverages for 24 hours.  ACTIVITY:  You should plan to take it easy for the rest of today and you should NOT DRIVE or use heavy machinery until tomorrow (because of the sedation medicines used during the test).    FOLLOW UP: Our staff will call the number listed on your records 48-72 hours following your procedure to check on you and address any questions or concerns that you may have regarding the information given to you following your procedure. If we do not reach you, we will leave a message.  We will attempt to reach you two times.  During this call, we will ask if you have developed any symptoms of COVID 19. If you develop any symptoms (ie: fever, flu-like symptoms, shortness of breath, cough etc.) before then, please call [phone].  If you test positive for Covid 19 in the 2 weeks post procedure, please call and report this information to us.    If any biopsies were taken you will be contacted by phone or by letter within the next 1-3 weeks.  Please call us at [phone] if you have not heard about the biopsies in 3 weeks.    SIGNATURES/CONFIDENTIALITY: You and/or your care partner have signed paperwork which will be entered into your electronic medical record.  These signatures attest to the fact that that the information above on   your After Visit Summary has been reviewed and is understood.  Full responsibility of the confidentiality of this discharge information lies with you and/or your care-partner. 

## 2020-11-05 NOTE — Op Note (Signed)
[place] Patient Name: Mary Sanford Procedure Date: 11/05/2020 3:28 PM MRN: [id number] Endoscopist: Remo Lipps P. Havery Moros , MD Age: 55 Referring MD:  Date of Birth: 1965-11-22 Gender: Female Account #: [id number] Procedure:                Colonoscopy Indications:              High risk colon cancer surveillance: Personal                            history of colonic polyps (2018 - 4 polyps), mother                            with colon cancer dx age 72s Medicines:                Monitored Anesthesia Care Procedure:                Pre-Anesthesia Assessment:                           - Prior to the procedure, a History and Physical                            was performed, and patient medications and                            allergies were reviewed. The patient's tolerance of                            previous anesthesia was also reviewed. The risks                            and benefits of the procedure and the sedation                            options and risks were discussed with the patient.                            All questions were answered, and informed consent                            was obtained. Prior Anticoagulants: The patient has                            taken no previous anticoagulant or antiplatelet                            agents. ASA Grade Assessment: II - A patient with                            mild systemic disease. After reviewing the risks                            and benefits, the patient was deemed in  satisfactory condition to undergo the procedure.                           After obtaining informed consent, the colonoscope                            was passed under direct vision. Throughout the                            procedure, the patient's blood pressure, pulse, and                            oxygen saturations were monitored continuously. The                            Olympus PFC-H190DL  [id number]) Colonoscope was                            introduced through the anus and advanced to the the                            cecum, identified by appendiceal orifice and                            ileocecal valve. The colonoscopy was performed                            without difficulty. The patient tolerated the                            procedure well. The quality of the bowel                            preparation was good. The ileocecal valve,                            appendiceal orifice, and rectum were photographed. Scope In: 3:42:07 PM Scope Out: 4:06:52 PM Scope Withdrawal Time: 0 hours 20 minutes 27 seconds  Total Procedure Duration: 0 hours 24 minutes 45 seconds  Findings:                 The perianal and digital rectal examinations were                            normal.                           Two sessile polyps were found in the ascending                            colon. The polyps were 2 to 7 mm in size. These                            polyps were removed with a cold snare. Resection  and retrieval were complete. There was persistent                            mild oozing at the base of the larger polyp that                            was removed, after several minutes of observation.                            Snare tip coagulation was then applied with                            immediate hemostasis.                           A 3 mm polyp was found in the transverse colon. The                            polyp was sessile. The polyp was removed with a                            cold snare. Resection and retrieval were complete.                           A 3 mm polyp was found in the descending colon. The                            polyp was sessile. The polyp was removed with a                            cold snare. Resection and retrieval were complete.                           The exam was otherwise without  abnormality. Complications:            No immediate complications. Estimated blood loss:                            Minimal. Estimated Blood Loss:     Estimated blood loss was minimal. Impression:               - Two 2 to 7 mm polyps in the ascending colon,                            removed with a cold snare. Resected and retrieved.                            Snare tip coagulation applied as above to larger                            polypectomy site for hemostasis.                           -  One 3 mm polyp in the transverse colon, removed                            with a cold snare. Resected and retrieved.                           - One 3 mm polyp in the descending colon, removed                            with a cold snare. Resected and retrieved.                           - The examination was otherwise normal. Recommendation:           - Patient has a contact number available for                            emergencies. The signs and symptoms of potential                            delayed complications were discussed with the                            patient. Return to normal activities tomorrow.                            Written discharge instructions were provided to the                            patient.                           - Resume previous diet.                           - Continue present medications.                           - Await pathology results. Remo Lipps P. [name], MD 11/05/2020 4:12:39 PM This report has been signed electronically.

## 2020-11-05 NOTE — Progress Notes (Signed)
pt tolerated well. VSS. awake and to recovery. Report given to RN.  

## 2020-11-05 NOTE — Progress Notes (Signed)
Called to room to assist during endoscopic procedure.  Patient ID and intended procedure confirmed with present staff. Received instructions for my participation in the procedure from the performing physician.  

## 2020-11-05 NOTE — Progress Notes (Signed)
Vitals by [facility]. Pt's states no medical or surgical changes since previsit or office visit.  

## 2020-11-07 ENCOUNTER — Telehealth: Payer: Self-pay

## 2020-11-07 NOTE — Telephone Encounter (Signed)
  Follow up Call-  Call back number 11/05/2020 10/11/2020  Post procedure Call Back phone  # [phone]  Permission to leave phone message Yes Yes  Some recent data might be hidden     Patient questions:  Do you have a fever, pain , or abdominal swelling? No. Pain Score  0 *  Have you tolerated food without any problems? Yes.    Have you been able to return to your normal activities? Yes.    Do you have any questions about your discharge instructions: Diet   No. Medications  No. Follow up visit  No.  Do you have questions or concerns about your Care? No.  Actions: * If pain score is 4 or above: No action needed, pain <4. 1. Have you developed a fever since your procedure? no  2.   Have you had an respiratory symptoms (SOB or cough) since your procedure? no  3.   Have you tested positive for COVID 19 since your procedure no  4.   Have you had any family members/close contacts diagnosed with the COVID 19 since your procedure?  no   If yes to any of these questions please route to Joylene John, RN and Joella Prince, RN

## 2020-11-07 NOTE — Telephone Encounter (Signed)
No answer, left message to call later today, B.Diella Gillingham RN.

## 2020-11-12 DIAGNOSIS — K912 Postsurgical malabsorption, not elsewhere classified: Secondary | ICD-10-CM

## 2020-11-12 DIAGNOSIS — R1013 Epigastric pain: Secondary | ICD-10-CM

## 2020-11-15 MED ORDER — ONDANSETRON 4 MG PO TBDP: 4.0000 mg | ORAL_TABLET | Freq: Three times a day (TID) | ORAL | 0 refills | Status: DC | PRN

## 2020-11-15 MED ORDER — DICYCLOMINE HCL 20 MG PO TABS: 20.0000 mg | ORAL_TABLET | Freq: Three times a day (TID) | ORAL | 0 refills | Status: DC

## 2020-11-15 NOTE — Telephone Encounter (Signed)
I called the patient.  She still nauseous feeling weak and tired and having relatively rapid postprandial abdominal pain.  I have told her to do the following:  Go back on omeprazole mg twice daily open capsules  I have sent in prescription as follows  Meds ordered this encounter  Medications  . ondansetron (ZOFRAN-ODT) 4 MG disintegrating tablet    Sig: Take 1 tablet (4 mg total) by mouth every 8 (eight) hours as needed for nausea or vomiting.    Dispense:  20 tablet    Refill:  0  . dicyclomine (BENTYL) 20 MG tablet    Sig: Take 1 tablet (20 mg total) by mouth 3 (three) times daily before meals.    Dispense:  60 tablet    Refill:  0   Orders Placed This Encounter  Procedures  . CBC  . Comp Met (CMET)  . Lipase  . Amylase  . Ferritin  . Methylmalonic Acid   She will also go on a liquid diet and advance as tolerated.  I explained that her cecum was seen a colonoscopy.  I explained that its not surprising her abdominal pain is not gone she did get 10 days of the treatment for H. pylori in she thinks it may be she eradicated.  I am not going to prescribe retreatment yet and I will defer to Dr. Havery Moros.   If she fails to improve/gets worse she may need to go to an urgent care versus an ER over the weekend.  I did not think that was necessarily right now.  I am giving her a work note for today tomorrow and Monday.

## 2020-11-15 NOTE — Telephone Encounter (Signed)
Dr. Lyndel Safe as DOD this morning, please see information below. Dr. Doyne Keel patient was diagnosed with H. Pylori after EGD on 10/11/20.   Patient reports continued epigastric pain but she was unable to complete the full course of antibiotics, reports that the medications made her sick and she was still experiencing epigastric pain while taking them. Please advise, thank you.

## 2020-11-15 NOTE — Telephone Encounter (Signed)
Dr. Carlean Purl as DOD this afternoon, please see previous message below and advise. Thank you.

## 2020-11-19 NOTE — Telephone Encounter (Signed)
Noted, thank you

## 2020-11-20 NOTE — Telephone Encounter (Signed)
Dr. Havery Moros, just an FYI. Thanks   Spoke with patient, she states that she was not able to come in for the lab work yesterday because she felt weak but is feeling a little better at this time. Pt states that she will try to come in for lab work today. She is aware that I will call her to remind her when to stop the Omeprazole prior to her H. Pylori stool antigen test. Answered all of patient's questions, she verbalized understanding and had no concerns at the end of the call.

## 2020-11-26 ENCOUNTER — Other Ambulatory Visit: Payer: Self-pay | Admitting: Internal Medicine

## 2020-11-26 ENCOUNTER — Telehealth: Payer: Self-pay

## 2020-11-26 DIAGNOSIS — F418 Other specified anxiety disorders: Secondary | ICD-10-CM

## 2020-11-26 DIAGNOSIS — K912 Postsurgical malabsorption, not elsewhere classified: Secondary | ICD-10-CM

## 2020-11-26 DIAGNOSIS — Z903 Acquired absence of stomach [part of]: Secondary | ICD-10-CM

## 2020-11-26 DIAGNOSIS — R1013 Epigastric pain: Secondary | ICD-10-CM

## 2020-11-26 NOTE — Addendum Note (Signed)
Addended by: Yevette Edwards on: 11/26/2020 01:32 PM   Modules accepted: Orders

## 2020-11-26 NOTE — Telephone Encounter (Signed)
Noted, lab orders discontinued.

## 2020-11-26 NOTE — Telephone Encounter (Signed)
Inbound call from patient. She states she does not want to get the lab work done anymore if she does not need to do so.

## 2020-11-26 NOTE — Telephone Encounter (Signed)
If she is still feeling poorly she can go to the lab to get that done.  If she is feeling well then she can hold her omeprazole at this time otherwise to await H. pylori eradication testing.  Thanks

## 2020-11-26 NOTE — Telephone Encounter (Signed)
Spoke with patient to remind her to hold her Omeprazole for 2 weeks starting today so that she can complete the H. Pylor stool study. Patient states that she still hasn't had a chance to get by the lab to complete the lab work. Dr. Havery Moros, while you were out Dr. Carlean Purl as DOD ordered CBC, CMET, Lipase, Amylase, Ferritin, and Methylmalonic acid. I no longer see the orders in epic, okay to order under your name? Please advise, thanks.

## 2020-11-26 NOTE — Telephone Encounter (Signed)
Spoke with patient, she states that she would still like to have the lab work done since she still has abdominal pain after she eats. Advised that this is fine and she can stop by at her convenience for the lab work. Patient verbalized understanding and had no concerns at the end of the call.   Lab orders in epic.

## 2020-11-26 NOTE — Telephone Encounter (Signed)
-----   Message from Yevette Edwards, RN sent at 11/20/2020 10:41 AM EDT ----- Regarding: Hold Omeprazole Hold omeprazole, due for H. Pylori stool antigen on/after 12/07/20.

## 2020-12-04 ENCOUNTER — Ambulatory Visit: Payer: Federal, State, Local not specified - PPO | Admitting: Internal Medicine

## 2020-12-06 ENCOUNTER — Telehealth: Payer: Self-pay

## 2020-12-06 NOTE — Telephone Encounter (Signed)
-----   Message from Yevette Edwards, RN sent at 10/22/2020  1:17 PM EDT ----- Regarding: H. pylori H. Pylori stool antigen, order in epic. On or after 12/07/20.

## 2020-12-06 NOTE — Telephone Encounter (Signed)
Lab reminder sent to patient via My Chart.  

## 2020-12-11 ENCOUNTER — Other Ambulatory Visit: Payer: Self-pay | Admitting: Internal Medicine

## 2020-12-11 DIAGNOSIS — F418 Other specified anxiety disorders: Secondary | ICD-10-CM

## 2020-12-28 ENCOUNTER — Encounter: Payer: Self-pay | Admitting: Radiology

## 2021-01-04 ENCOUNTER — Encounter: Payer: Self-pay | Admitting: Internal Medicine

## 2021-01-04 ENCOUNTER — Ambulatory Visit: Payer: Federal, State, Local not specified - PPO | Admitting: Internal Medicine

## 2021-01-04 ENCOUNTER — Other Ambulatory Visit: Payer: Self-pay

## 2021-01-04 VITALS — BP 124/72 | HR 62 | Temp 98.5°F | Resp 18 | Ht 69.0 in | Wt 197.2 lb

## 2021-01-04 DIAGNOSIS — L299 Pruritus, unspecified: Secondary | ICD-10-CM | POA: Insufficient documentation

## 2021-01-04 LAB — CBC
HCT: 42.4 % (ref 36.0–46.0)
Hemoglobin: 13.7 g/dL (ref 12.0–15.0)
MCHC: 32.4 g/dL (ref 30.0–36.0)
MCV: 73.3 fl — ABNORMAL LOW (ref 78.0–100.0)
Platelets: 236 10*3/uL (ref 150.0–400.0)
RBC: 5.78 Mil/uL — ABNORMAL HIGH (ref 3.87–5.11)
RDW: 14.1 % (ref 11.5–15.5)
WBC: 5.4 10*3/uL (ref 4.0–10.5)

## 2021-01-04 LAB — TSH: TSH: 0.64 u[IU]/mL (ref 0.35–4.50)

## 2021-01-04 LAB — HEMOGLOBIN A1C: Hgb A1c MFr Bld: 6 % (ref 4.6–6.5)

## 2021-01-04 LAB — VITAMIN B12: Vitamin B-12: 280 pg/mL (ref 211–911)

## 2021-01-04 LAB — VITAMIN D 25 HYDROXY (VIT D DEFICIENCY, FRACTURES): VITD: 25.25 ng/mL — ABNORMAL LOW (ref 30.00–100.00)

## 2021-01-04 NOTE — Progress Notes (Signed)
   Subjective:   Patient ID: Mary Sanford, female    DOB: Jul 19, 1966, 55 y.o.   MRN: [id number]  HPI The patient is a 55 YO female coming in for itching all over. Denies new medications in the last month or dose changes or antibiotics. Denies new lotions or creams. No rash or lesions. Denies chest pains or SOB or stomach problems.   Review of Systems  Constitutional: Negative.   HENT: Negative.   Eyes: Negative.   Respiratory: Negative for cough, chest tightness and shortness of breath.   Cardiovascular: Negative for chest pain, palpitations and leg swelling.  Gastrointestinal: Negative for abdominal distention, abdominal pain, constipation, diarrhea, nausea and vomiting.  Musculoskeletal: Negative.   Skin: Negative.        Itching  Neurological: Negative.   Psychiatric/Behavioral: Negative.     Objective:  Physical Exam Constitutional:      Appearance: She is well-developed.  HENT:     Head: Normocephalic and atraumatic.  Cardiovascular:     Rate and Rhythm: Normal rate and regular rhythm.  Pulmonary:     Effort: Pulmonary effort is normal. No respiratory distress.     Breath sounds: Normal breath sounds. No wheezing or rales.  Abdominal:     General: Bowel sounds are normal. There is no distension.     Palpations: Abdomen is soft.     Tenderness: There is no abdominal tenderness. There is no rebound.  Musculoskeletal:     Cervical back: Normal range of motion.  Skin:    General: Skin is warm and dry.  Neurological:     Mental Status: She is alert and oriented to person, place, and time.     Coordination: Coordination normal.     Vitals:   01/04/21 1052  BP: 124/72  Pulse: 62  Resp: 18  Temp: 98.5 F (36.9 C)  TempSrc: Oral  SpO2: 97%  Weight: 197 lb 3.2 oz (89.4 kg)  Height: 5\' 9"  (1.753 m)    This visit occurred during the SARS-CoV-2 public health emergency.  Safety protocols were in place, including screening questions prior to the visit, additional  usage of staff PPE, and extensive cleaning of exam room while observing appropriate contact time as indicated for disinfecting solutions.   Assessment & Plan:

## 2021-01-04 NOTE — Patient Instructions (Signed)
We will check the blood work today to check for the itching.

## 2021-01-04 NOTE — Assessment & Plan Note (Signed)
Checking labs for cause. Treat as appropriate.

## 2021-01-07 LAB — COMPREHENSIVE METABOLIC PANEL
ALT: 23 U/L (ref 0–35)
AST: 29 U/L (ref 0–37)
Albumin: 4.1 g/dL (ref 3.5–5.2)
Alkaline Phosphatase: 61 U/L (ref 39–117)
BUN: 21 mg/dL (ref 6–23)
CO2: 24 mEq/L (ref 19–32)
Calcium: 10 mg/dL (ref 8.4–10.5)
Chloride: 102 mEq/L (ref 96–112)
Creatinine, Ser: 0.96 mg/dL (ref 0.40–1.20)
GFR: 67.47 mL/min (ref >60.00)
Glucose, Bld: 90 mg/dL (ref 70–99)
Potassium: 4.2 mEq/L (ref 3.5–5.1)
Sodium: 139 mEq/L (ref 135–145)
Total Bilirubin: 0.4 mg/dL (ref 0.2–1.2)
Total Protein: 6.9 g/dL (ref 6.0–8.3)

## 2021-01-09 NOTE — Progress Notes (Signed)
I, [name], LAT, ATC, am serving as scribe for Dr. Lynne Leader.  Mary Sanford is a 55 y.o. female who presents to [place] at [facility] today for f/u of L thumb pain.  She was last seen by Dr. Georgina Snell on 10/29/20 for her R thumb and prior to that on 06/08/20 for her L thumb.  At her last visit, pt had a R thumb injection and prior to that was given a L 1st CMC joint steroid injection, thumb spica splint, limited tramadol, and given a rheumatologic work up.  Today, pt reports that her L thumb pain has returned and has been flared up x 2 weeks.  She states that any movement resisted or not in her L thumb causes her pain.  She states that she is having so much pain that she can't put on her brace due to having to move her thumb to get into the brace.  She tried Voltaren gel and it did not help at all.  At this point she is interested to consider surgical options for both of her thumbs but especially her left which is worse.  Pertinent review of systems: No fevers or chills  Relevant historical information: Hypertension   Exam:  BP (!) 166/100 (BP Location: Right Arm, Patient Position: Sitting, Cuff Size: Normal)   Pulse 61   Ht 5\' 9"  (1.753 m)   Wt 202 lb 9.6 oz (91.9 kg)   SpO2 98%   BMI 29.92 kg/m  General: Well Developed, well nourished, and in no acute distress.   MSK: Left thumb mild effusion at [facility].  Tender palpation.  Decreased motion.  Pulses capillary refill and sensation are intact.    Lab and Radiology Results  Procedure: Real-time Ultrasound Guided Injection of left thumb CMC Device: Philips Affiniti 50G Images permanently stored and available for review in PACS Verbal informed consent obtained.  Discussed risks and benefits of procedure. Warned about infection bleeding damage to structures skin hypopigmentation and fat atrophy among others. Patient expresses understanding and agreement Time-out conducted.   Noted no overlying erythema,  induration, or other signs of local infection.   Skin prepped in a sterile fashion.   Local anesthesia: Topical Ethyl chloride.   With sterile technique and under real time ultrasound guidance: 40 mg of Depo-Medrol (0.93ml of 80mg /83ml) and 0.5 mL of lidocaine injected into CMC. Fluid seen entering the joint capsule.   Completed without difficulty   Pain immediately resolved suggesting accurate placement of the medication.   Advised to call if fevers/chills, erythema, induration, drainage, or persistent bleeding.   Images permanently stored and available for review in the ultrasound unit.  Impression: Technically successful ultrasound guided injection.    EXAM: LEFT HAND - COMPLETE 3+ VIEW   COMPARISON:  Radiograph 06/21/2015   FINDINGS: Progressive osteoarthrosis at the first carpometacarpal joint when compared to prior imaging. Additional ossific fragment seen lateral to the scaphotrapezial articulation which could reflect some degenerative ossification or remote fracture given some questionable mineralization in this vicinity on comparison imaging. Corticated appearance arguing against an acute avulsion type injury. Could correlate for point tenderness. No other acute or worrisome osseous abnormality. No other significant arthropathy. Soft tissues are unremarkable.   IMPRESSION: 1. Progressive osteoarthrosis at the first carpometacarpal joint when compared to prior imaging. 2. Osseous fragment seen lateral to the scaphotrapezial articulation, possibly degenerative or remote injury sequela given some questionable mineralization in this vicinity on comparison imaging.     Electronically Signed  By: Lovena Le M.D.   On: 06/11/2020 01:57 I, Lynne Leader, personally (independently) visualized and performed the interpretation of the images attached in this note.      Assessment and Plan: 55 y.o. 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 Plan for injection  today.  Last injection was in November.  However patient is very frustrated by her overall experience and would like to consider surgical options.  Repeat injection today and refer to hand surgery.  Return as needed.  Certainly could inject the thumb again in the future if she needs it and could inject the other contralateral right thumb again in the future as well.   PDMP not reviewed this encounter. Orders Placed This Encounter  Procedures   [place] LIMITED JOINT SPACE STRUCTURES UP LEFT(NO LINKED CHARGES)    Order Specific Question:   Reason for Exam (SYMPTOM  OR DIAGNOSIS REQUIRED)    Answer:   L thumb pain    Order Specific Question:   Preferred imaging location?    Answer:   [facility]   Ambulatory referral to Orthopedic Surgery    Referral Priority:   Routine    Referral Type:   Surgical    Referral Reason:   Specialty Services Required    Referred to Provider:   Roseanne Kaufman, MD    Requested Specialty:   Orthopedic Surgery    Number of Visits Requested:   1   No orders of the defined types were placed in this encounter.    Discussed warning signs or symptoms. Please see discharge instructions. Patient expresses understanding.   The above documentation has been reviewed and is accurate and complete Lynne Leader, M.D.

## 2021-01-10 ENCOUNTER — Other Ambulatory Visit: Payer: Self-pay

## 2021-01-10 ENCOUNTER — Ambulatory Visit (INDEPENDENT_AMBULATORY_CARE_PROVIDER_SITE_OTHER): Payer: Federal, State, Local not specified - PPO | Admitting: Family Medicine

## 2021-01-10 ENCOUNTER — Encounter: Payer: Self-pay | Admitting: Family Medicine

## 2021-01-10 ENCOUNTER — Ambulatory Visit: Payer: Self-pay

## 2021-01-10 VITALS — BP 166/100 | HR 61 | Ht 69.0 in | Wt 202.6 lb

## 2021-01-10 DIAGNOSIS — G8929 Other chronic pain: Secondary | ICD-10-CM | POA: Diagnosis not present

## 2021-01-10 DIAGNOSIS — M79645 Pain in left finger(s): Secondary | ICD-10-CM | POA: Diagnosis not present

## 2021-01-10 NOTE — Patient Instructions (Addendum)
Good to see you today.   You had a L thumb injection today.  Call or go to the ER if you develop a large red swollen joint with extreme pain or oozing puss.   You should hear from Dr Vanetta Shawl office ([facility]) soon.   Let me know if you do not hear in the near future.   Return as needed for injections or other care.

## 2021-01-23 DIAGNOSIS — M1812 Unilateral primary osteoarthritis of first carpometacarpal joint, left hand: Secondary | ICD-10-CM | POA: Diagnosis not present

## 2021-01-23 DIAGNOSIS — M79642 Pain in left hand: Secondary | ICD-10-CM | POA: Diagnosis not present

## 2021-02-14 DIAGNOSIS — M25532 Pain in left wrist: Secondary | ICD-10-CM | POA: Diagnosis not present

## 2021-02-21 DIAGNOSIS — M13842 Other specified arthritis, left hand: Secondary | ICD-10-CM | POA: Diagnosis not present

## 2021-02-21 DIAGNOSIS — M79645 Pain in left finger(s): Secondary | ICD-10-CM | POA: Diagnosis not present

## 2021-03-06 DIAGNOSIS — M1812 Unilateral primary osteoarthritis of first carpometacarpal joint, left hand: Secondary | ICD-10-CM | POA: Diagnosis not present

## 2021-03-06 DIAGNOSIS — M13842 Other specified arthritis, left hand: Secondary | ICD-10-CM | POA: Diagnosis not present

## 2021-03-07 ENCOUNTER — Other Ambulatory Visit: Payer: Self-pay

## 2021-03-07 ENCOUNTER — Emergency Department (HOSPITAL_COMMUNITY)
Admission: EM | Admit: 2021-03-07 | Discharge: 2021-03-07 | Disposition: A | Discharge status: Home / Self Care | Payer: Federal, State, Local not specified - PPO | Attending: Emergency Medicine | Admitting: Emergency Medicine

## 2021-03-07 ENCOUNTER — Encounter (HOSPITAL_COMMUNITY): Payer: Self-pay

## 2021-03-07 DIAGNOSIS — M79602 Pain in left arm: Secondary | ICD-10-CM | POA: Insufficient documentation

## 2021-03-07 DIAGNOSIS — Z5321 Procedure and treatment not carried out due to patient leaving prior to being seen by health care provider: Secondary | ICD-10-CM | POA: Diagnosis not present

## 2021-03-07 DIAGNOSIS — T50901A Poisoning by unspecified drugs, medicaments and biological substances, accidental (unintentional), initial encounter: Secondary | ICD-10-CM | POA: Insufficient documentation

## 2021-03-07 DIAGNOSIS — G8918 Other acute postprocedural pain: Secondary | ICD-10-CM | POA: Diagnosis not present

## 2021-03-07 NOTE — ED Notes (Signed)
Patient states she is going to go and call her doctor in the morning

## 2021-03-07 NOTE — ED Triage Notes (Signed)
Pt reports that she had surgery today on her L wrist and reports that she thinks she is taking too much of her medications that were prescribed due to pain being out of control.

## 2021-03-07 NOTE — ED Provider Notes (Signed)
Emergency Medicine Provider Triage Evaluation Note  Mary Sanford , Sanford 55 y.o. female  was evaluated in triage.  Pt complains of unintentional overdose and postoperative pain.  The patient reports that she had surgery on her left wrist at the surgical center earlier today.  She picked up her prescription of methocarbamol and 5-325 mg of oxycodone acetaminophen at 11 AM after her procedure.  Since picking up her prescription, she has taken 4 tablets of Percocet and 4 tablets of methocarbamol, which was more frequently than what was prescribed on the bottle as she felt as if her pain was uncontrolled.  She has no concern that she has taken too much of her medication because she is "feeling funny".  She continues to endorse uncontrolled pain in her left arm.  She denies chest pain, shortness of breath, falls, syncope.  No other treatment prior to arrival.  She did not make contact with her surgical team prior to coming to the emergency department for evaluation.  Review of Systems  Positive: Left arm pain Negative: Chest pain, shortness of breath, syncope, abdominal pain, vomiting, rash, numbness, weakness  Physical Exam  There were no vitals taken for this visit. Gen:   Awake, no distress   Resp:  Normal effort  MSK:   Moves extremities without difficulty  Other:  Speech is slightly slurred.  Alert and oriented x3.  Normal respirations.  Answers questions appropriately.  Sensation is intact to the distal aspect of all 4 digits of the left hand.  Independently moves all digits of the left hand.  Digits are well-perfused.  Sling and splint is in place.  Medical Decision Making  Medically screening exam initiated at 1:08 AM.  Appropriate orders placed.  NYELA SLUYTER was informed that the remainder of the evaluation will be completed by another provider, this initial triage assessment does not replace that evaluation, and the importance of remaining in the ED until their evaluation is  complete.  Patient had surgery on her left wrist earlier today.  She was having severe pain and took 4 tablets of methocarbamol and 4 tablets of 5-325 mg of Percocet over the last 14 hours.  She is concerned that she may have unintentionally overdosed.  She continues to endorse severe, uncontrolled pain in her left arm.  No numbness, weakness.  No hypoxia at this time.  Mentation is appropriate.  No concern for opioid overdose.  She will require further work-up, observation, and evaluation in the emergency department.   Mary Maxcy A, PA-C 03/07/21 0115    Ezequiel Essex, MD 03/07/21 0140

## 2021-03-21 DIAGNOSIS — M13842 Other specified arthritis, left hand: Secondary | ICD-10-CM | POA: Diagnosis not present

## 2021-03-26 ENCOUNTER — Other Ambulatory Visit: Payer: Self-pay | Admitting: Internal Medicine

## 2021-03-26 DIAGNOSIS — I10 Essential (primary) hypertension: Secondary | ICD-10-CM

## 2021-04-04 HISTORY — PX: OTHER SURGICAL HISTORY: SHX169

## 2021-04-04 HISTORY — PX: FINGER SURGERY: SHX640

## 2021-04-18 DIAGNOSIS — M79642 Pain in left hand: Secondary | ICD-10-CM | POA: Diagnosis not present

## 2021-04-18 DIAGNOSIS — M13842 Other specified arthritis, left hand: Secondary | ICD-10-CM | POA: Diagnosis not present

## 2021-05-01 ENCOUNTER — Other Ambulatory Visit: Payer: Self-pay | Admitting: Internal Medicine

## 2021-05-01 DIAGNOSIS — I10 Essential (primary) hypertension: Secondary | ICD-10-CM

## 2021-05-14 DIAGNOSIS — M79642 Pain in left hand: Secondary | ICD-10-CM | POA: Diagnosis not present

## 2021-05-14 IMAGING — DX DG HAND COMPLETE 3+V*L*
3 series · 3 of 3 positions shown · non-contrast
Comparison: Radiograph 06/21/2015

CLINICAL DATA: CMC pain for 1 year

EXAM:
LEFT HAND - COMPLETE 3+ VIEW

[hand ap]
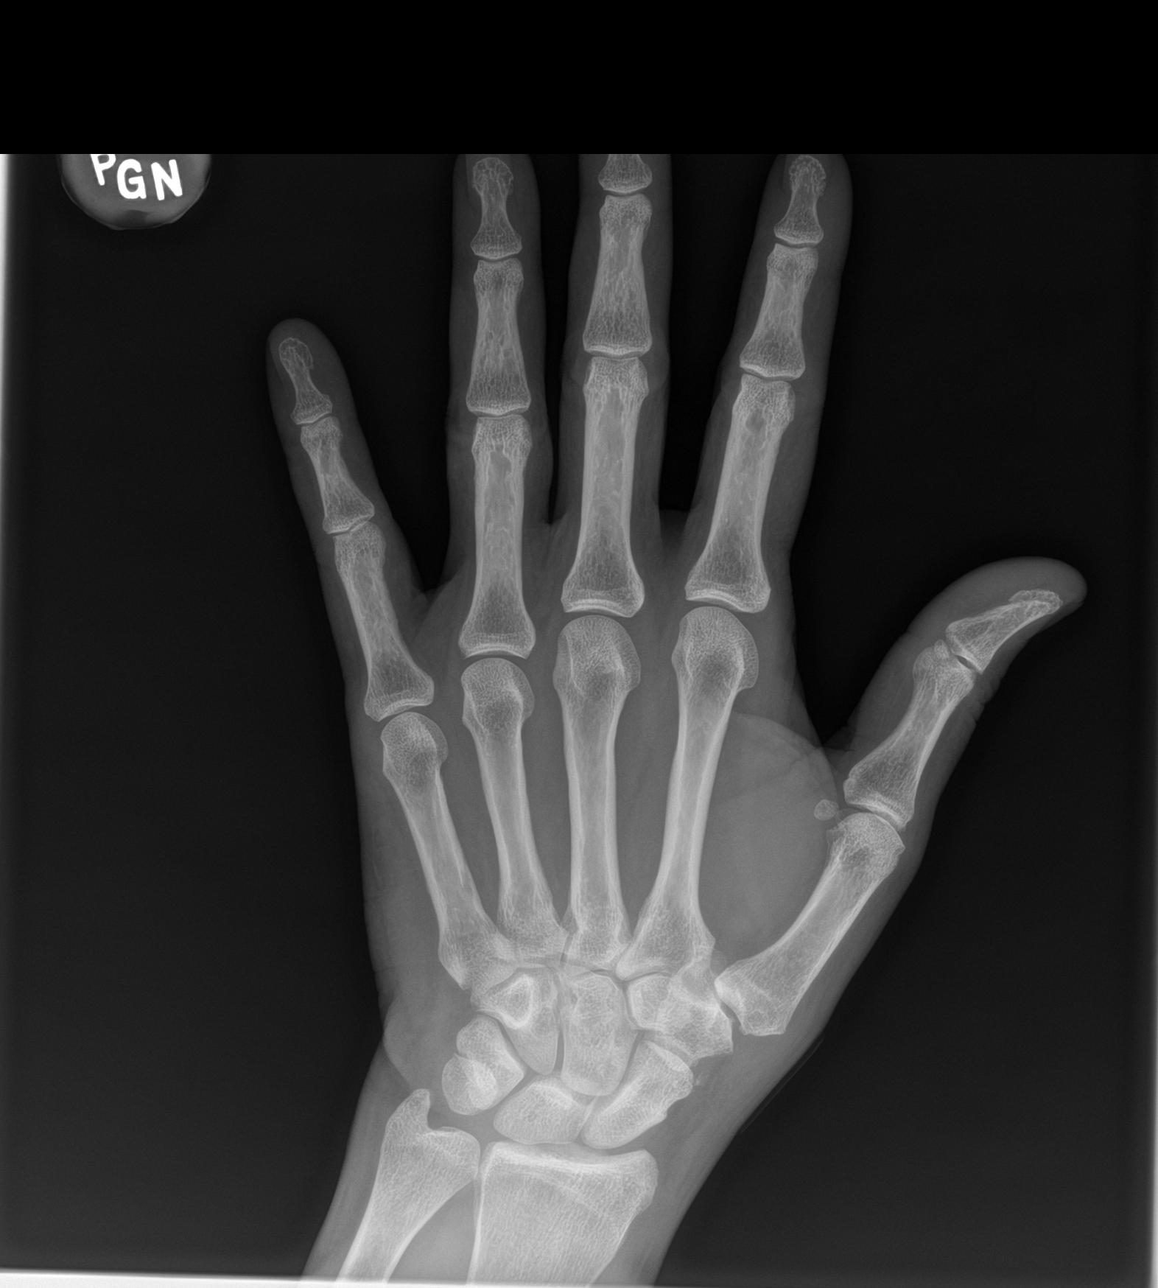

[hand obl]
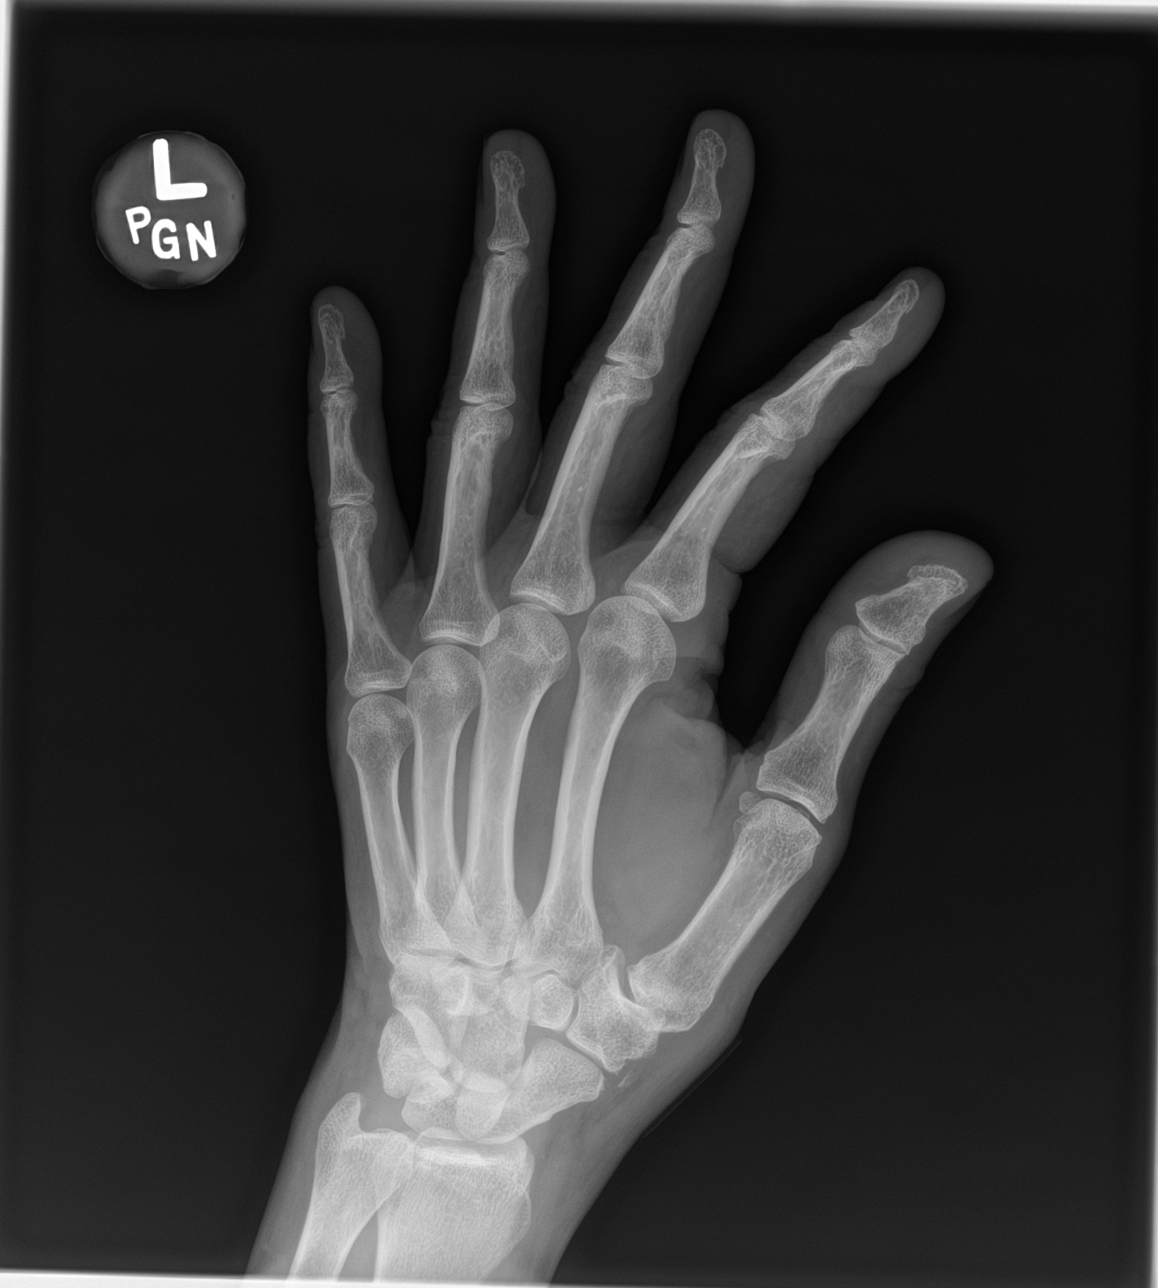

[hand lat]
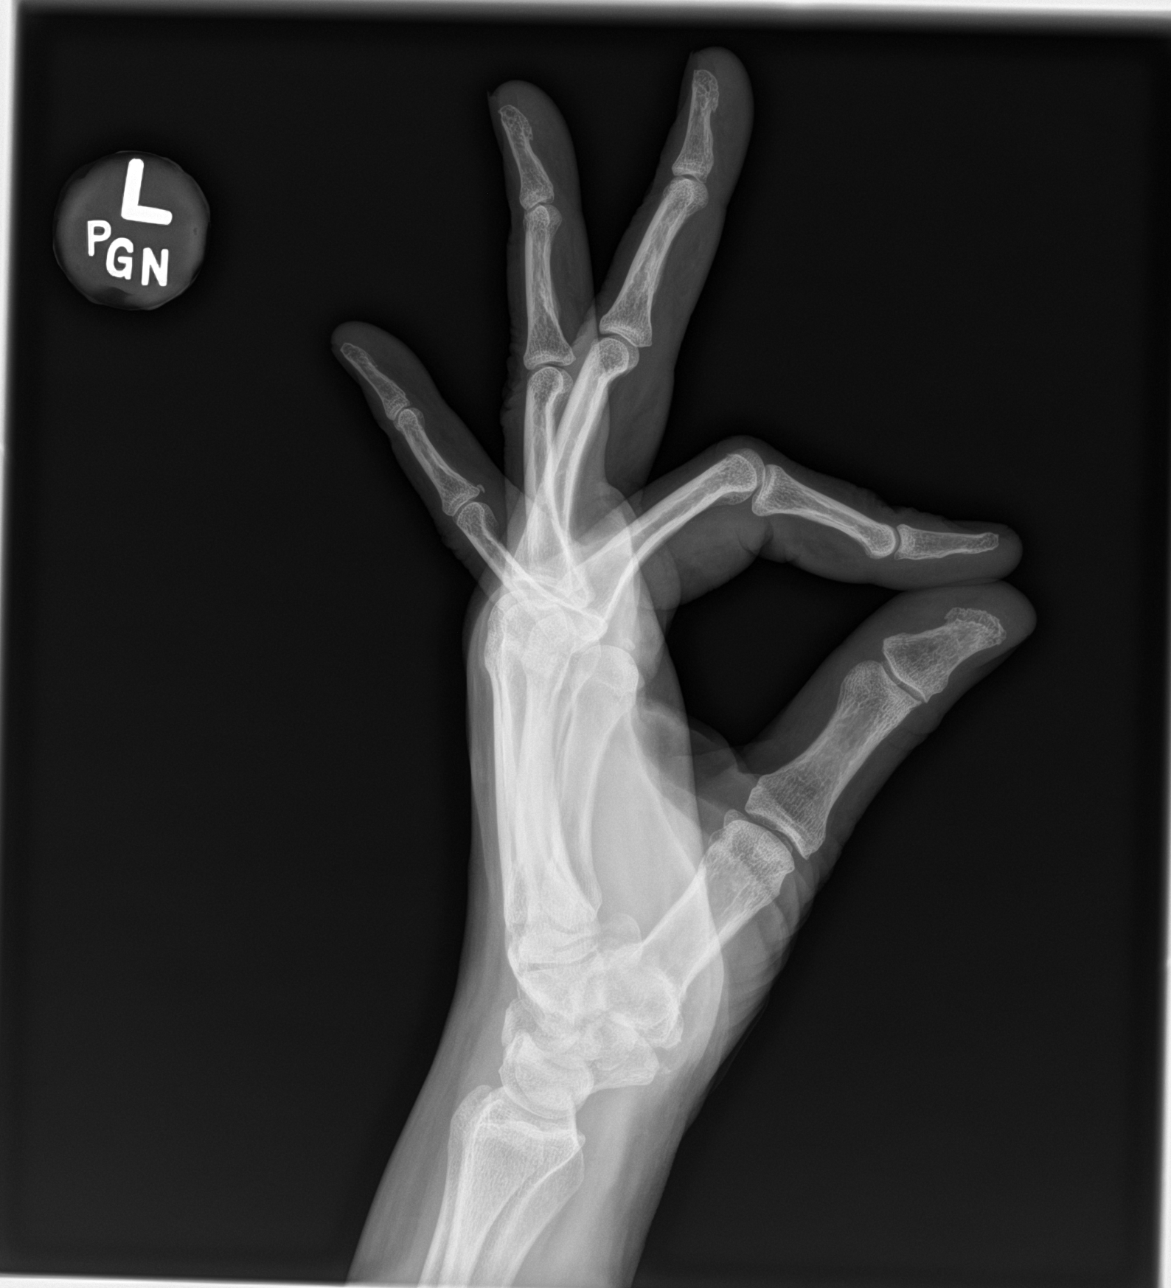

[3 of 3 positions shown; findings below may reference images not displayed]

FINDINGS: Progressive osteoarthrosis at the first carpometacarpal joint when
compared to prior imaging. Additional ossific fragment seen lateral
to the scaphotrapezial articulation which could reflect some
degenerative ossification or remote fracture given some questionable
mineralization in this vicinity on comparison imaging. Corticated
appearance arguing against an acute avulsion type injury. Could
correlate for point tenderness. No other acute or worrisome osseous
abnormality. No other significant arthropathy. Soft tissues are
unremarkable.
IMPRESSION: 1. Progressive osteoarthrosis at the first carpometacarpal joint
when compared to prior imaging.
2. Osseous fragment seen lateral to the scaphotrapezial
articulation, possibly degenerative or remote injury sequela given
some questionable mineralization in this vicinity on comparison
imaging.

## 2021-05-16 DIAGNOSIS — M25642 Stiffness of left hand, not elsewhere classified: Secondary | ICD-10-CM | POA: Diagnosis not present

## 2021-05-16 DIAGNOSIS — M13842 Other specified arthritis, left hand: Secondary | ICD-10-CM | POA: Diagnosis not present

## 2021-05-16 DIAGNOSIS — M13841 Other specified arthritis, right hand: Secondary | ICD-10-CM | POA: Diagnosis not present

## 2021-05-16 DIAGNOSIS — M79642 Pain in left hand: Secondary | ICD-10-CM | POA: Diagnosis not present

## 2021-05-30 DIAGNOSIS — J209 Acute bronchitis, unspecified: Secondary | ICD-10-CM | POA: Diagnosis not present

## 2021-06-05 ENCOUNTER — Encounter: Payer: Self-pay | Admitting: Internal Medicine

## 2021-06-05 ENCOUNTER — Other Ambulatory Visit: Payer: Self-pay

## 2021-06-05 ENCOUNTER — Ambulatory Visit (INDEPENDENT_AMBULATORY_CARE_PROVIDER_SITE_OTHER): Payer: Federal, State, Local not specified - PPO | Admitting: Internal Medicine

## 2021-06-05 VITALS — BP 128/80 | HR 60 | Temp 97.8°F | Ht 69.0 in | Wt 212.0 lb

## 2021-06-05 DIAGNOSIS — D510 Vitamin B12 deficiency anemia due to intrinsic factor deficiency: Secondary | ICD-10-CM

## 2021-06-05 DIAGNOSIS — E669 Obesity, unspecified: Secondary | ICD-10-CM | POA: Diagnosis not present

## 2021-06-05 DIAGNOSIS — I10 Essential (primary) hypertension: Secondary | ICD-10-CM

## 2021-06-05 DIAGNOSIS — R7303 Prediabetes: Secondary | ICD-10-CM

## 2021-06-05 DIAGNOSIS — Z114 Encounter for screening for human immunodeficiency virus [HIV]: Secondary | ICD-10-CM | POA: Insufficient documentation

## 2021-06-05 DIAGNOSIS — E663 Overweight: Secondary | ICD-10-CM

## 2021-06-05 DIAGNOSIS — Z1159 Encounter for screening for other viral diseases: Secondary | ICD-10-CM | POA: Insufficient documentation

## 2021-06-05 DIAGNOSIS — Z23 Encounter for immunization: Secondary | ICD-10-CM | POA: Diagnosis not present

## 2021-06-05 LAB — CBC WITH DIFFERENTIAL/PLATELET
Basophils Absolute: 0.1 10*3/uL (ref 0.0–0.1)
Basophils Relative: 1 % (ref 0.0–3.0)
Eosinophils Absolute: 0.1 10*3/uL (ref 0.0–0.7)
Eosinophils Relative: 1.4 % (ref 0.0–5.0)
HCT: 41.9 % (ref 36.0–46.0)
Hemoglobin: 13.2 g/dL (ref 12.0–15.0)
Lymphocytes Relative: 49.5 % — ABNORMAL HIGH (ref 12.0–46.0)
Lymphs Abs: 3.6 10*3/uL (ref 0.7–4.0)
MCHC: 31.5 g/dL (ref 30.0–36.0)
MCV: 74.3 fl — ABNORMAL LOW (ref 78.0–100.0)
Monocytes Absolute: 0.7 10*3/uL (ref 0.1–1.0)
Monocytes Relative: 9.6 % (ref 3.0–12.0)
Neutro Abs: 2.8 10*3/uL (ref 1.4–7.7)
Neutrophils Relative %: 38.5 % — ABNORMAL LOW (ref 43.0–77.0)
Platelets: 248 10*3/uL (ref 150.0–400.0)
RBC: 5.63 Mil/uL — ABNORMAL HIGH (ref 3.87–5.11)
RDW: 14.2 % (ref 11.5–15.5)
WBC: 7.2 10*3/uL (ref 4.0–10.5)

## 2021-06-05 LAB — BASIC METABOLIC PANEL
BUN: 19 mg/dL (ref 6–23)
CO2: 33 mEq/L — ABNORMAL HIGH (ref 19–32)
Calcium: 9.7 mg/dL (ref 8.4–10.5)
Chloride: 100 mEq/L (ref 96–112)
Creatinine, Ser: 0.87 mg/dL (ref 0.40–1.20)
GFR: 75.72 mL/min (ref >60.00)
Glucose, Bld: 89 mg/dL (ref 70–99)
Potassium: 3.9 mEq/L (ref 3.5–5.1)
Sodium: 139 mEq/L (ref 135–145)

## 2021-06-05 LAB — FOLATE: Folate: 11.9 ng/mL (ref >5.9)

## 2021-06-05 LAB — HEMOGLOBIN A1C: Hgb A1c MFr Bld: 6 % (ref 4.6–6.5)

## 2021-06-05 MED ORDER — PHENTERMINE HCL 37.5 MG PO CAPS: 37.5000 mg | ORAL_CAPSULE | ORAL | 2 refills | Status: DC

## 2021-06-05 NOTE — Progress Notes (Signed)
Subjective:  Patient ID: Mary Sanford, female    DOB: 1966/02/02  Age: 55 y.o. MRN: [id number]  CC: Hypertension and Osteoarthritis  This visit occurred during the SARS-CoV-2 public health emergency.  Safety protocols were in place, including screening questions prior to the visit, additional usage of staff PPE, and extensive cleaning of exam room while observing appropriate contact time as indicated for disinfecting solutions.    HPI Mary Sanford presents for f/up -  Her BP has been well controlled. She complains of bilateral hand (arthritis) pain.  Outpatient Medications Prior to Visit  Medication Sig Dispense Refill   acetaminophen (TYLENOL) 500 MG tablet Take 500 mg by mouth every 6 (six) hours as needed for headache or moderate pain.     albuterol (VENTOLIN HFA) 108 (90 Base) MCG/ACT inhaler Inhale 1-2 puffs into the lungs every 6 (six) hours as needed for wheezing or shortness of breath (for seasonal allergies). 18 g 3   APPLE CIDER VINEGAR PO Take 2 tablets by mouth daily.     Cholecalciferol (VITAMIN D3) 25 MCG (1000 UT) CHEW Chew 2 tablets by mouth daily.     escitalopram (LEXAPRO) 20 MG tablet Take 1 tablet by mouth once daily 90 tablet 1   nebivolol (BYSTOLIC) 5 MG tablet Take 1 tablet by mouth once daily 90 tablet 0   triamterene-hydrochlorothiazide (MAXZIDE-25) 37.5-25 MG tablet Take 1 tablet by mouth once daily 90 tablet 0   phentermine 37.5 MG capsule Take 1 capsule (37.5 mg total) by mouth every morning. 30 capsule 2   dicyclomine (BENTYL) 20 MG tablet Take 1 tablet (20 mg total) by mouth 3 (three) times daily before meals. 60 tablet 0   omeprazole (PRILOSEC) 40 MG capsule Take 1 capsule (40 mg total) by mouth in the morning and at bedtime for 14 days. Break capsule open and ingest contents given gastric bypass history 28 capsule 0   ondansetron (ZOFRAN-ODT) 4 MG disintegrating tablet Take 1 tablet (4 mg total) by mouth every 8 (eight) hours as needed for nausea or  vomiting. 20 tablet 0   No facility-administered medications prior to visit.    ROS Review of Systems  Constitutional:  Negative for chills, diaphoresis, fatigue and fever.  HENT: Negative.    Eyes: Negative.   Respiratory:  Negative for chest tightness, shortness of breath and stridor.   Cardiovascular:  Negative for chest pain, palpitations and leg swelling.  Gastrointestinal:  Negative for abdominal pain, constipation, diarrhea, nausea and vomiting.  Endocrine: Negative.   Genitourinary:  Negative for difficulty urinating.  Musculoskeletal:  Positive for arthralgias. Negative for back pain.  Skin: Negative.  Negative for color change.  Neurological: Negative.  Negative for dizziness, weakness, light-headedness and headaches.  Hematological:  Negative for adenopathy. Does not bruise/bleed easily.  Psychiatric/Behavioral: Negative.     Objective:  BP 128/80 (BP Location: Right Arm, Patient Position: Sitting, Cuff Size: Large)   Pulse 60   Temp 97.8 F (36.6 C) (Oral)   Ht 5\' 9"  (1.753 m)   Wt 212 lb (96.2 kg)   SpO2 98%   BMI 31.31 kg/m   BP Readings from Last 3 Encounters:  06/05/21 128/80  03/07/21 (!) 170/115  01/10/21 (!) 166/100    Wt Readings from Last 3 Encounters:  06/05/21 212 lb (96.2 kg)  01/10/21 202 lb 9.6 oz (91.9 kg)  01/04/21 197 lb 3.2 oz (89.4 kg)    Physical Exam Vitals reviewed.  Constitutional:      Appearance: She is  obese.  Eyes:     Conjunctiva/sclera: Conjunctivae normal.  Cardiovascular:     Rate and Rhythm: Normal rate and regular rhythm.     Pulses: Normal pulses.     Heart sounds: No murmur heard. Pulmonary:     Effort: Pulmonary effort is normal.     Breath sounds: No stridor. No wheezing, rhonchi or rales.  Abdominal:     General: Abdomen is flat.     Palpations: There is no mass.     Tenderness: There is no abdominal tenderness. There is no guarding.     Hernia: No hernia is present.  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 Normal range of motion.     Cervical back: Neck supple.     Right lower leg: No edema.     Left lower leg: No edema.  Skin:    General: Skin is warm.  Neurological:     General: No focal deficit present.     Mental Status: She is alert.  Psychiatric:        Mood and Affect: Mood normal.        Behavior: Behavior normal.    Lab Results  Component Value Date   WBC 7.2 06/05/2021   HGB 13.2 06/05/2021   HCT 41.9 06/05/2021   PLT 248.0 06/05/2021   GLUCOSE 89 06/05/2021   CHOL 120 06/14/2020   TRIG 84.0 06/14/2020   HDL 67.70 06/14/2020   LDLCALC 36 06/14/2020   ALT 23 01/04/2021   AST 29 01/04/2021   NA 139 06/05/2021   K 3.9 06/05/2021   CL 100 06/05/2021   CREATININE 0.87 06/05/2021   BUN 19 06/05/2021   CO2 33 (H) 06/05/2021   TSH 0.64 01/04/2021   HGBA1C 6.0 06/05/2021    No results found.  Assessment & Plan:   Mary Sanford was seen today for hypertension and osteoarthritis.  Diagnoses and all orders for this visit:  Essential hypertension, benign- Her BP is well controlled. -     CBC with Differential/Platelet; Future -     Basic metabolic panel; Future -     Basic metabolic panel -     CBC with Differential/Platelet  Screening for HIV (human immunodeficiency virus) -     HIV Antibody (routine testing w rflx); Future -     HIV Antibody (routine testing w rflx)  Need for hepatitis C screening test -     Hepatitis C antibody; Future -     Hepatitis C antibody  Pre-diabetes- Her A1c is at 6.0%. medical tx is not indicated. -     Hemoglobin A1c; Future -     Basic metabolic panel; Future -     Basic metabolic panel -     Hemoglobin A1c  Vitamin B12 deficiency anemia due to intrinsic factor deficiency- will cont B12 repl tx. -     CBC with Differential/Platelet; Future -     Folate; Future -     Folate -     CBC with Differential/Platelet  Obesity (BMI 30-39.9) -     phentermine 37.5 MG capsule; Take 1 capsule  (37.5 mg total) by mouth every morning.  Overweight (BMI 25.0-29.9)  Other orders -     Flu Vaccine QUAD 6+ mos PF IM (Fluarix Quad PF)  I have discontinued Mary Sanford's omeprazole, ondansetron, and dicyclomine. I am also having her maintain her albuterol, acetaminophen, Vitamin D3, APPLE CIDER VINEGAR PO, escitalopram, nebivolol, triamterene-hydrochlorothiazide,  and phentermine.  Meds ordered this encounter  Medications   phentermine 37.5 MG capsule    Sig: Take 1 capsule (37.5 mg total) by mouth every morning.    Dispense:  30 capsule    Refill:  2      Follow-up: Return in about 3 months (around 09/05/2021).  Scarlette Calico, MD

## 2021-06-05 NOTE — Patient Instructions (Signed)

## 2021-06-06 LAB — HEPATITIS C ANTIBODY
Hepatitis C Ab: NONREACTIVE
SIGNAL TO CUT-OFF: 0.07 (ref <1.00)

## 2021-06-06 LAB — HIV ANTIBODY (ROUTINE TESTING W REFLEX): HIV 1&2 Ab, 4th Generation: NONREACTIVE

## 2021-06-10 DIAGNOSIS — M79642 Pain in left hand: Secondary | ICD-10-CM | POA: Diagnosis not present

## 2021-06-10 DIAGNOSIS — M25642 Stiffness of left hand, not elsewhere classified: Secondary | ICD-10-CM | POA: Diagnosis not present

## 2021-06-13 DIAGNOSIS — M25642 Stiffness of left hand, not elsewhere classified: Secondary | ICD-10-CM | POA: Diagnosis not present

## 2021-06-13 DIAGNOSIS — M13842 Other specified arthritis, left hand: Secondary | ICD-10-CM | POA: Diagnosis not present

## 2021-06-17 ENCOUNTER — Encounter: Payer: Self-pay | Admitting: Gastroenterology

## 2021-06-17 ENCOUNTER — Other Ambulatory Visit: Payer: Federal, State, Local not specified - PPO

## 2021-06-17 ENCOUNTER — Ambulatory Visit (INDEPENDENT_AMBULATORY_CARE_PROVIDER_SITE_OTHER): Payer: Federal, State, Local not specified - PPO | Admitting: Gastroenterology

## 2021-06-17 VITALS — BP 100/80 | HR 80 | Ht 69.0 in | Wt 212.5 lb

## 2021-06-17 DIAGNOSIS — Z9884 Bariatric surgery status: Secondary | ICD-10-CM

## 2021-06-17 DIAGNOSIS — L290 Pruritus ani: Secondary | ICD-10-CM | POA: Diagnosis not present

## 2021-06-17 DIAGNOSIS — R1013 Epigastric pain: Secondary | ICD-10-CM

## 2021-06-17 DIAGNOSIS — A048 Other specified bacterial intestinal infections: Secondary | ICD-10-CM

## 2021-06-17 MED ORDER — SUCRALFATE 1 GM/10ML PO SUSP: 1.0000 g | Freq: Four times a day (QID) | ORAL | 1 refills | Status: DC | PRN

## 2021-06-17 NOTE — Patient Instructions (Addendum)
If you are age 54 or older, your body mass index should be between 23-30. Your Body mass index is 31.38 kg/m. If this is out of the aforementioned range listed, please consider follow up with your Primary Care Provider.  If you are age 73 or younger, your body mass index should be between 19-25. Your Body mass index is 31.38 kg/m. If this is out of the aformentioned range listed, please consider follow up with your Primary Care Provider.   ________________________________________________________  The [facility] providers would like to encourage you to use [facility] to communicate with providers for non-urgent requests or questions.  Due to long hold times on the telephone, sending your provider a message by [facility] may be a faster and more efficient way to get a response.  Please allow 48 business hours for a response.  Please remember that this is for non-urgent requests.  _______________________________________________________  Dennis Bast will be contacted by [facility] in the next 2 days to arrange a CT scan of abdomen and Pelvis with Contrast.  The number on your caller ID will be [phone], please answer when they call.  If you have not heard from them in 2 days please call [phone] to schedule.    You have been scheduled for a CT scan of the abdomen and pelvis at [facility], 1st floor Radiology. You are scheduled on ______________  at _________________. You should arrive 15 minutes prior to your appointment time for registration.  Please pick up 2 bottles of contrast from [facility] at least 3 days prior to your scan. The solution may taste better if refrigerated, but do NOT add ice or any other liquid to this solution. Shake well before drinking.   Please follow the written instructions below on the day of your exam:   1) Do not eat anything after _________________ (4 hours prior to your test)   2) Drink 1 bottle of contrast @ ___________ (2 hours prior to your  exam)  Remember to shake well before drinking and do NOT pour over ice.     Drink 1 bottle of contrast @ ____________ (1 hour prior to your exam)   You may take any medications as prescribed with a small amount of water, if necessary. If you take any of the following medications: METFORMIN, GLUCOPHAGE, GLUCOVANCE, AVANDAMET, RIOMET, FORTAMET, [place] MET, JANUMET, GLUMETZA or METAGLIP, you MAY be asked to HOLD this medication 48 hours AFTER the exam.   The purpose of you drinking the oral contrast is to aid in the visualization of your intestinal tract. The contrast solution may cause some diarrhea. Depending on your individual set of symptoms, you may also receive an intravenous injection of x-ray contrast/dye. Plan on being at [facility] for 45 minutes or longer, depending on the type of exam you are having performed.   If you have any questions regarding your exam or if you need to reschedule, you may call [facility] at [phone] between the hours of 8:00 am and 5:00 pm, Monday-Friday.  ___________________________________________________________    We have sent the following medications to your pharmacy for you to pick up at your convenience:Carafate suspension: Take 10 ml every 6 hours as needed  (do not start until after you submit the stool sample for H pylori to the lab)  Please go to the lab in the basement of our building to have lab work done as you leave today. Hit "B" for basement when you get on the elevator.  When the doors open the lab is on your left.  We will call you with the results. Thank you.  Thank you for entrusting me with your care and for choosing [facility], Dr. El Duende Cellar

## 2021-06-17 NOTE — Progress Notes (Signed)
HPI :  55 year old female here for follow-up visit for abdominal pain.  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  She has been having some postprandial epigastric pain for several months now.  She feels immediate pain when she eats, can be solids or liquids.  He will stay upwards of an hour before passing on its own.  She tries to lie down to find relief after she eats.  This will occur with pretty much every meal.  She states she is eating the same volume as normal and has not caused any weight loss, in fact she thinks she has gained a bit of weight recently but thinks it is due to steroid she took for bronchitis.  She denies any vomiting.  She denies any reflux symptoms.  She is not using any NSAIDs.  She had been using omeprazole, cracking capsule open prior to ingestion which do not provide any benefit.  She had an EGD for these issues back in March which did not show any concerning findings, there was no marginal ulcers.  She was treated for H. pylori and completed 14 days worth of amoxicillin, azithromycin, Flagyl, and PPI.  She has not had eradication testing yet.  She then had a right upper quadrant ultrasound which showed no evidence of gallstones.  Pain stays in her epigastric area does not radiate to the back or shoulder that she is aware of.  She has not been using PPIs recently as she says they do not help.  Of note I see the patient had a CT scan in 2019, that was at the time of her gastric bypass when she was in the hospital with ongoing abdominal pain.  She has not had any cross-sectional imaging of her abdomen since her surgery otherwise  She otherwise endorses some perianal itching and feels some "tissue" in her backside, she thinks perhaps hemorrhoids.  She had a colonoscopy in April which a few polyps were removed, no concerning pathology otherwise.  Prior workup: Colonoscopy 11/05/20 - The perianal and digital rectal examinations were normal. - Two sessile polyps were found in  the ascending colon. The polyps were 2 to 7 mm in size. These polyps were removed with a cold snare. Resection and retrieval were complete. There was persistent mild oozing at the base of the larger polyp that was removed, after several minutes of observation. Snare tip coagulation was then applied with immediate hemostasis. - A 3 mm polyp was found in the transverse colon. The polyp was sessile. The polyp was removed with a cold snare. Resection and retrieval were complete. - A 3 mm polyp was found in the descending colon. The polyp was sessile. The polyp was removed with a cold snare. Resection and retrieval were complete. - The exam was otherwise without abnormality.  Surgical [P], colon, transverse, ascending, & descending, polyp (4) - TUBULAR ADENOMA (S). - NO HIGH-GRADE DYSPLASIA OR CARCINOMA.    EGD 10/11/20 -  - The exam of the esophagus was otherwise normal. - Evidence of a Roux-en-Y gastrojejunostomy was found. The gastrojejunal anastomosis was characterized by healthy appearing mucosa. No ulcerations were noted. A protruding staple was noted and removed with forceps. - The exam of the stomach was otherwise normal. - Biopsies were taken with a cold forceps for Helicobacter pylori testing. - The examined small bowel limb was normal.  Surgical [P], gastric pouch biopsies - CHRONIC ACTIVE GASTRITIS - H. PYLORI ORGANISMS PRESENT - NO INTESTINAL METAPLASIA IDENTIFIED - SEE COMMENT  RUQ [place] 10/22/20: IMPRESSION: Probable mild fatty liver, otherwise unremarkable right upper quadrant ultrasound.  CT abdomen / pelvis with contrast 06/27/2018 - IMPRESSION: 1. There is a cluster of 3 small 2-3 mm calculi at the left ureteropelvic junction with mild proximal hydronephrosis indicative of mild obstruction at this time. 2. Multiple additional nonobstructive calculi are noted in the collecting system of the left kidney measuring 2-3 mm in size. 3. Status post Roux-en-Y gastric  bypass.   Past Medical History:  Diagnosis Date   Allergy    seasonal allergies   Anemia    Anxiety    Arthritis    hands, left knee   COVID    Depression    Family history of adverse reaction to anesthesia    one sister ponv and slow to awaken   History of iron deficiency anemia    History of kidney stones    Hypertension    followed by pcp  (12-05-2019 pt had ETT 12-11-2011 , in epic, showed normal with no ischemia)   Nephrolithiasis    Panic disorder    PMB (postmenopausal bleeding)    55 year old female    followed by pcp   Seasonal allergies    SUI (stress urinary incontinence, female)    Uterine fibroid    Wears glasses      Past Surgical History:  Procedure Laterality Date   BUNIONECTOMY Bilateral    CESAREAN SECTION  1989   CYSTOSCOPY W/ RETROGRADES Left 06/28/2018   Procedure: CYSTOSCOPY WITH RETROGRADE PYELOGRAM LEFT STENT;  Surgeon: Ceasar Mons, MD;  Location: [facility];  Service: Urology;  Laterality: Left;   CYSTOSCOPY/URETEROSCOPY/HOLMIUM LASER/STENT PLACEMENT Left 07/16/2018   Procedure: CYSTOSCOPY/URETEROSCOPY/HOLMIUM LASER/STENT EXCHANGE;  Surgeon: Ceasar Mons, MD;  Location: [facility];  Service: Urology;  Laterality: Left;   DILATATION & CURETTAGE/HYSTEROSCOPY WITH MYOSURE N/A 12/13/2019   Procedure: DILATATION & CURETTAGE/HYSTEROSCOPY WITH MYOSURE;  Surgeon: Salvadore Dom, MD;  Location: [place];  Service: Gynecology;  Laterality: N/A;   DILITATION & CURRETTAGE/HYSTROSCOPY WITH NOVASURE ABLATION N/A 06/17/2013   Procedure: DILATATION & CURETTAGE/HYSTEROSCOPY WITH NOVASURE ABLATION;  Surgeon: Osborne Oman, MD;  Location: [facility];  Service: Gynecology;  Laterality: N/A;   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   Procedure: LAPAROSCOPIC ROUX-EN-Y GASTRIC BYPASS WITH UPPER ENDOSCOPY;  Surgeon: Greer Pickerel, MD;  Location: [facility];  Service: General;  Laterality: N/A;   HEMORRHOIDECTOMY WITH HEMORRHOID  BANDING  10/19/2012   [facility] Hem ligation/pexy   OPERATIVE ULTRASOUND N/A 12/13/2019   Procedure: OPERATIVE ULTRASOUND;  Surgeon: Salvadore Dom, MD;  [facility];  Service: Gynecology;  Laterality: N/A;   TUBAL LIGATION Bilateral 2001   UPPER GASTROINTESTINAL ENDOSCOPY  10/11/2020   WISDOM TOOTH EXTRACTION     Family History  Problem Relation Age of Onset   Hypertension Mother    Hypothyroidism Mother    Rectal cancer Mother 31   Colon cancer Mother 74   Colon polyps Mother    Hypotension Sister    Hypothyroidism Sister    Diabetes Sister    Other Sister        pacemaker   Seizures Brother    Hypothyroidism Sister    Seizures Sister    Anemia Sister    Deep vein thrombosis Sister    Seizures Paternal Grandfather    Stroke Maternal Uncle        > 9   Diabetes Maternal Grandmother    Kidney failure Maternal Grandmother  Heart attack Maternal Grandfather    Other Paternal Grandmother        tumors throughout body   Hypertension Maternal Uncle    Esophageal cancer Neg Hx    Stomach cancer Neg Hx    Heart disease Neg Hx    Social History   Tobacco Use   Smoking status: Light Smoker    Years: 5.00    Types: Cigarettes    Last attempt to quit: 08/05/1999    Years since quitting: 21.8   Smokeless tobacco: Never   Tobacco comments:    Occasionally  Vaping Use   Vaping Use: Never used  Substance Use Topics   Alcohol use: Yes    Alcohol/week: 4.0 standard drinks    Types: 4 Glasses of wine per week   Drug use: No   Current Outpatient Medications  Medication Sig Dispense Refill   acetaminophen (TYLENOL) 500 MG tablet Take 500-1,000 mg by mouth as needed for headache or moderate pain.     albuterol (VENTOLIN HFA) 108 (90 Base) MCG/ACT inhaler Inhale 1-2 puffs into the lungs every 6 (six) hours as needed for wheezing or shortness of breath (for seasonal allergies). (Patient taking differently: Inhale 1-2 puffs into the lungs as needed for  wheezing or shortness of breath (for seasonal allergies).) 18 g 3   APPLE CIDER VINEGAR PO Take 2 tablets by mouth daily.     Cholecalciferol (VITAMIN D3) 25 MCG (1000 UT) CHEW Chew 2 tablets by mouth daily.     escitalopram (LEXAPRO) 20 MG tablet Take 1 tablet by mouth once daily 90 tablet 1   HYDROcodone-acetaminophen (NORCO) 10-325 MG tablet Take 1 tablet by mouth 4 (four) times daily as needed.     nebivolol (BYSTOLIC) 5 MG tablet Take 1 tablet by mouth once daily 90 tablet 0   phentermine 37.5 MG capsule Take 1 capsule (37.5 mg total) by mouth every morning. 30 capsule 2   triamterene-hydrochlorothiazide (MAXZIDE-25) 37.5-25 MG tablet Take 1 tablet by mouth once daily 90 tablet 0   No current facility-administered medications for this visit.   Allergies  Allergen Reactions   Naproxen Other (See Comments)    Panic attacks No Problem with Motrin     Review of Systems: All systems reviewed and negative except where noted in HPI.    Lab Results  Component Value Date   WBC 7.2 06/05/2021   HGB 13.2 06/05/2021   HCT 41.9 06/05/2021   MCV 74.3 (L) 06/05/2021   PLT 248.0 06/05/2021    Lab Results  Component Value Date   CREATININE 0.87 06/05/2021   BUN 19 06/05/2021   NA 139 06/05/2021   K 3.9 06/05/2021   CL 100 06/05/2021   CO2 33 (H) 06/05/2021    Lab Results  Component Value Date   ALT 23 01/04/2021   AST 29 01/04/2021   ALKPHOS 61 01/04/2021   BILITOT 0.4 01/04/2021     Physical Exam: BP 100/80 (BP Location: Left Arm, Patient Position: Sitting, Cuff Size: Normal)   Pulse 80   Ht 5\' 9"  (1.753 m) Comment: height measured without shoes  Wt 212 lb 8 oz (96.4 kg)   BMI 31.38 kg/m  Constitutional: Pleasant,well-developed, female in no acute distress. Abdominal: Soft, nondistended, nontender. There are no masses palpable.  DRE - small skin tags, no fissure or hemorrhoids - CMA Tia Alert as standby Extremities: no edema Neurological: Alert and oriented to  person place and time. Skin: Skin is warm and dry. No rashes noted.  Psychiatric: Normal mood and affect. Behavior is normal.   ASSESSMENT AND PLAN: 55 year old female here for reassessment of following:  Epigastric pain History of gastric bypass Anal pruritus  Patient is status post gastric bypass in 2019, with ongoing postprandial epigastric pain.  EGD did not show any abnormal pathology on gross exam but she did have H. pylori on biopsies, which was treated.  There were no marginal ulcers on that exam.  She has not had any benefit with empiric PPI, omeprazole capsules cracked open and contents ingested twice daily.  Right upper quadrant ultrasound showed no evidence of gallstones, although symptoms still could be biliary colic.  I discussed options with her.  We will check for H. pylori eradication with a stool antigen test although would think this is unlikely to cause her symptoms without PUD.  She is not interested in another trial of PPI as it has not helped in the past, but we will try her on some Carafate to see if that makes any changes.  Given her post gastric bypass status, ongoing postprandial abdominal pain with negative EGD and ultrasound, recommend CT abdomen pelvis as next step to ensure no abnormal anatomy/internal hernia related to her bypass status.  If this is negative we will then consider HIDA scan to rule out gallbladder dyskinesia.  Patient agrees with the plan as outlined.  Of note she has some small perianal skin tags and reassured her, she can use hydrocortisone cream as needed for itching. Colonoscopy up to date.  Plan: - CT scan abdomen / pelvis with contrast - H pylori stool antigen - trial of Carafate 10cc po every 6 hours PRN - will pursue HIDA scan if CT negative  I spent 30 minutes of time, including in depth chart review, face-to-face time with the patient, coordinating care, and ordering studies and medications as appropriate, and documentation.  Jolly Mango, MD [facility]

## 2021-06-20 ENCOUNTER — Other Ambulatory Visit: Payer: Federal, State, Local not specified - PPO

## 2021-06-20 ENCOUNTER — Ambulatory Visit: Payer: Federal, State, Local not specified - PPO | Admitting: Obstetrics and Gynecology

## 2021-06-20 DIAGNOSIS — Z9884 Bariatric surgery status: Secondary | ICD-10-CM | POA: Diagnosis not present

## 2021-06-20 DIAGNOSIS — L290 Pruritus ani: Secondary | ICD-10-CM

## 2021-06-20 DIAGNOSIS — A048 Other specified bacterial intestinal infections: Secondary | ICD-10-CM

## 2021-06-20 DIAGNOSIS — R1013 Epigastric pain: Secondary | ICD-10-CM

## 2021-06-20 DIAGNOSIS — Z0289 Encounter for other administrative examinations: Secondary | ICD-10-CM

## 2021-06-20 NOTE — Addendum Note (Signed)
Addended by: Yevette Edwards on: 06/20/2021 08:22 AM   Modules accepted: Orders

## 2021-06-22 LAB — H. PYLORI ANTIGEN, STOOL: H pylori Ag, Stl: NEGATIVE

## 2021-06-24 ENCOUNTER — Encounter: Payer: Self-pay | Admitting: Obstetrics and Gynecology

## 2021-06-24 ENCOUNTER — Ambulatory Visit: Payer: Federal, State, Local not specified - PPO | Admitting: Obstetrics and Gynecology

## 2021-06-24 ENCOUNTER — Other Ambulatory Visit: Payer: Self-pay

## 2021-06-24 VITALS — BP 118/70 | HR 88 | Ht 69.0 in

## 2021-06-24 DIAGNOSIS — N760 Acute vaginitis: Secondary | ICD-10-CM

## 2021-06-24 LAB — WET PREP FOR TRICH, YEAST, CLUE

## 2021-06-24 MED ORDER — BETAMETHASONE VALERATE 0.1 % EX OINT: TOPICAL_OINTMENT | CUTANEOUS | 0 refills | Status: DC

## 2021-06-24 NOTE — Progress Notes (Signed)
GYNECOLOGY  VISIT   HPI: 55 y.o.   Married Black or [place] American Not Hispanic or Latino  female   [phone] with No LMP recorded. Patient has had an ablation.   here for patient had a yeast starting last week. She has treated with monistat x 1 last week. Symptoms have improved, still with mild itching, slight irritation. She has some white, creamy vaginal d/c. No odor.     GYNECOLOGIC HISTORY: No LMP recorded. Patient has had an ablation. Contraception:none  Menopausal hormone therapy: none         OB History     Gravida  7   Para  5   Term  4   Preterm  1   AB  2   Living  5      SAB  0   IAB  2   Ectopic  0   Multiple  0   Live Births  5              Patient Active Problem List   Diagnosis Date Noted   Need for hepatitis C screening test 06/05/2021   Screening for HIV (human immunodeficiency virus) 06/05/2021   Pre-diabetes    Positive ANA (antinuclear antibody) 06/20/2020   Mild persistent asthma without complication 01/74/9449   Overweight (BMI 25.0-29.9) 06/14/2020   Status post bariatric surgery 06/16/2019   Vitamin D insufficiency 06/16/2019   Low back pain 06/22/2018   ASCUS pap with negative high risk HPV on 06/30/16 06/30/2016   Depression with anxiety 12/28/2015   Anemia, iron deficiency 09/17/2013   Routine general medical examination at a health care facility 01/12/2013   Allergic rhinitis 01/12/2013   Constipation, chronic 10/04/2012   Visit for screening mammogram 01/12/2012   B12 deficiency anemia 01/09/2012   Obesity (BMI 30-39.9) 01/09/2012   Essential hypertension, benign 11/24/2011    Past Medical History:  Diagnosis Date   Allergy    seasonal allergies   Anemia    Anxiety    Arthritis    hands, left knee   COVID    Depression    Family history of adverse reaction to anesthesia    one sister ponv and slow to awaken   History of iron deficiency anemia    History of kidney stones    Hypertension    followed by pcp   (12-05-2019 pt had ETT 12-11-2011 , in epic, showed normal with no ischemia)   Nephrolithiasis    Panic disorder    PMB (postmenopausal bleeding)    Pre-diabetes    followed by pcp   Seasonal allergies    SUI (stress urinary incontinence, female)    Uterine fibroid    Wears glasses     Past Surgical History:  Procedure Laterality Date   BUNIONECTOMY Bilateral    CESAREAN SECTION  1989   CYSTOSCOPY W/ RETROGRADES Left 06/28/2018   Procedure: CYSTOSCOPY WITH RETROGRADE PYELOGRAM LEFT STENT;  Surgeon: Ceasar Mons, MD;  Location: [facility];  Service: Urology;  Laterality: Left;   CYSTOSCOPY/URETEROSCOPY/HOLMIUM LASER/STENT PLACEMENT Left 07/16/2018   Procedure: CYSTOSCOPY/URETEROSCOPY/HOLMIUM LASER/STENT EXCHANGE;  Surgeon: Ceasar Mons, MD;  Location: [facility];  Service: Urology;  Laterality: Left;   DILATATION & CURETTAGE/HYSTEROSCOPY WITH MYOSURE N/A 12/13/2019   Procedure: DILATATION & CURETTAGE/HYSTEROSCOPY WITH MYOSURE;  Surgeon: Salvadore Dom, MD;  Location: [place];  Service: Gynecology;  Laterality: N/A;   DILITATION & CURRETTAGE/HYSTROSCOPY WITH NOVASURE ABLATION N/A 06/17/2013   Procedure: DILATATION & CURETTAGE/HYSTEROSCOPY WITH  NOVASURE ABLATION;  Surgeon: Osborne Oman, MD;  Location: [facility];  Service: Gynecology;  Laterality: N/A;   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   Procedure: LAPAROSCOPIC ROUX-EN-Y GASTRIC BYPASS WITH UPPER ENDOSCOPY;  Surgeon: Greer Pickerel, MD;  Location: [facility];  Service: General;  Laterality: N/A;   HEMORRHOIDECTOMY WITH HEMORRHOID BANDING  10/19/2012   [facility] Hem ligation/pexy   OPERATIVE ULTRASOUND N/A 12/13/2019   Procedure: OPERATIVE ULTRASOUND;  Surgeon: Salvadore Dom, MD;  Location: [facility];  Service: Gynecology;  Laterality: N/A;   TUBAL LIGATION Bilateral 2001   UPPER GASTROINTESTINAL ENDOSCOPY  10/11/2020   WISDOM TOOTH EXTRACTION      Current  Outpatient Medications  Medication Sig Dispense Refill   acetaminophen (TYLENOL) 500 MG tablet Take 500-1,000 mg by mouth as needed for headache or moderate pain.     albuterol (VENTOLIN HFA) 108 (90 Base) MCG/ACT inhaler Inhale 1-2 puffs into the lungs every 6 (six) hours as needed for wheezing or shortness of breath (for seasonal allergies). (Patient taking differently: Inhale 1-2 puffs into the lungs as needed for wheezing or shortness of breath (for seasonal allergies).) 18 g 3   APPLE CIDER VINEGAR PO Take 2 tablets by mouth daily.     Cholecalciferol (VITAMIN D3) 25 MCG (1000 UT) CHEW Chew 2 tablets by mouth daily.     escitalopram (LEXAPRO) 20 MG tablet Take 1 tablet by mouth once daily 90 tablet 1   HYDROcodone-acetaminophen (NORCO) 10-325 MG tablet Take 1 tablet by mouth 4 (four) times daily as needed.     nebivolol (BYSTOLIC) 5 MG tablet Take 1 tablet by mouth once daily 90 tablet 0   phentermine 37.5 MG capsule Take 1 capsule (37.5 mg total) by mouth every morning. 30 capsule 2   sucralfate (CARAFATE) 1 GM/10ML suspension Take 10 mLs (1 g total) by mouth every 6 (six) hours as needed. 420 mL 1   triamterene-hydrochlorothiazide (MAXZIDE-25) 37.5-25 MG tablet Take 1 tablet by mouth once daily 90 tablet 0   No current facility-administered medications for this visit.     ALLERGIES: Naproxen  Family History  Problem Relation Age of Onset   Hypertension Mother    Hypothyroidism Mother    Rectal cancer Mother 54   Colon cancer Mother 72   Colon polyps Mother    Hypotension Sister    Hypothyroidism Sister    Diabetes Sister    Other Sister        pacemaker   Seizures Brother    Hypothyroidism Sister    Seizures Sister    Anemia Sister    Deep vein thrombosis Sister    Seizures Paternal Grandfather    Stroke Maternal Uncle        > 88   Diabetes Maternal Grandmother    Kidney failure Maternal Grandmother    Heart attack Maternal Grandfather    Other Paternal Grandmother         tumors throughout body   Hypertension Maternal Uncle    Esophageal cancer Neg Hx    Stomach cancer Neg Hx    Heart disease Neg Hx     Social History   Socioeconomic History   Marital status: Married    Spouse name: Not on file   Number of children: Not on file   Years of education: Not on file   Highest education level: Not on file  Occupational History   Occupation: minister  Tobacco Use   Smoking status: Light Smoker    Years:  5.00    Types: Cigarettes    Last attempt to quit: 08/05/1999    Years since quitting: 21.9   Smokeless tobacco: Never   Tobacco comments:    Occasionally  Vaping Use   Vaping Use: Never used  Substance and Sexual Activity   Alcohol use: Yes    Alcohol/week: 4.0 standard drinks    Types: 4 Glasses of wine per week   Drug use: No   Sexual activity: Yes    Partners: Male    Birth control/protection: Surgical    Comment: BTL, Ablation  Other Topics Concern   Not on file  Social History Narrative   Not on file   Social Determinants of Health   Financial Resource Strain: Not on file  Food Insecurity: Not on file  Transportation Needs: Not on file  Physical Activity: Not on file  Stress: Not on file  Social Connections: Not on file  Intimate Partner Violence: Not on file    Review of Systems  All other systems reviewed and are negative.  PHYSICAL EXAMINATION:    BP 118/70   Pulse 88   Ht 5\' 9"  (1.753 m)   SpO2 100%   BMI 31.38 kg/m     General appearance: alert, cooperative and appears stated age   Pelvic: External genitalia:  no lesions              Urethra:  normal appearing urethra with no masses, tenderness or lesions              Bartholins and Skenes: normal                 Vagina: normal appearing vagina with a slight increase in watery, white vaginal d/c              Cervix: no lesions               Chaperone, Gae Dry, was present for exam.  1. Vulvovaginitis - WET PREP FOR TRICH, YEAST, CLUE: negative -  betamethasone valerate ointment (VALISONE) 0.1 %; Apply a pea sized amount topically BID for up to 2 weeks as needed  Dispense: 30 g; Refill: 0 - [facility] Advanced Vaginitis, TMA

## 2021-06-24 NOTE — Patient Instructions (Signed)

## 2021-06-26 LAB — SURESWAB® ADVANCED VAGINITIS,TMA
CANDIDA SPECIES: NOT DETECTED
Candida glabrata: NOT DETECTED
SURESWAB(R) ADV BACTERIAL VAGINOSIS(BV),TMA: NEGATIVE
TRICHOMONAS VAGINALIS (TV),TMA: NOT DETECTED

## 2021-07-01 ENCOUNTER — Ambulatory Visit: Payer: Federal, State, Local not specified - PPO | Admitting: Internal Medicine

## 2021-07-01 ENCOUNTER — Encounter (HOSPITAL_BASED_OUTPATIENT_CLINIC_OR_DEPARTMENT_OTHER): Payer: Self-pay | Admitting: Emergency Medicine

## 2021-07-01 ENCOUNTER — Emergency Department (HOSPITAL_BASED_OUTPATIENT_CLINIC_OR_DEPARTMENT_OTHER)
Admission: EM | Admit: 2021-07-01 | Discharge: 2021-07-01 | Disposition: A | Discharge status: Home / Self Care | Payer: Federal, State, Local not specified - PPO | Attending: Emergency Medicine | Admitting: Emergency Medicine

## 2021-07-01 ENCOUNTER — Other Ambulatory Visit: Payer: Self-pay

## 2021-07-01 ENCOUNTER — Emergency Department (HOSPITAL_BASED_OUTPATIENT_CLINIC_OR_DEPARTMENT_OTHER): Payer: Federal, State, Local not specified - PPO

## 2021-07-01 DIAGNOSIS — Z79899 Other long term (current) drug therapy: Secondary | ICD-10-CM | POA: Insufficient documentation

## 2021-07-01 DIAGNOSIS — F17210 Nicotine dependence, cigarettes, uncomplicated: Secondary | ICD-10-CM | POA: Insufficient documentation

## 2021-07-01 DIAGNOSIS — Z8616 Personal history of COVID-19: Secondary | ICD-10-CM | POA: Diagnosis not present

## 2021-07-01 DIAGNOSIS — J4530 Mild persistent asthma, uncomplicated: Secondary | ICD-10-CM | POA: Diagnosis not present

## 2021-07-01 DIAGNOSIS — J101 Influenza due to other identified influenza virus with other respiratory manifestations: Secondary | ICD-10-CM | POA: Diagnosis not present

## 2021-07-01 DIAGNOSIS — I10 Essential (primary) hypertension: Secondary | ICD-10-CM | POA: Insufficient documentation

## 2021-07-01 DIAGNOSIS — Z20822 Contact with and (suspected) exposure to covid-19: Secondary | ICD-10-CM | POA: Diagnosis not present

## 2021-07-01 DIAGNOSIS — R059 Cough, unspecified: Secondary | ICD-10-CM | POA: Diagnosis not present

## 2021-07-01 LAB — RESP PANEL BY RT-PCR (FLU A&B, COVID) ARPGX2
Influenza A by PCR: POSITIVE — AB
Influenza B by PCR: NEGATIVE
SARS Coronavirus 2 by RT PCR: NEGATIVE

## 2021-07-01 MED ORDER — DEXAMETHASONE 4 MG PO TABS
10.0000 mg | ORAL_TABLET | Freq: Once | ORAL | Status: AC
  Administered 2021-07-01: 09:00:00 10 mg via ORAL
  Filled 2021-07-01: qty 3

## 2021-07-01 MED ORDER — BENZONATATE 100 MG PO CAPS: 100.0000 mg | ORAL_CAPSULE | Freq: Three times a day (TID) | ORAL | 0 refills | Status: DC

## 2021-07-01 MED ORDER — ACETAMINOPHEN 500 MG PO TABS
1000.0000 mg | ORAL_TABLET | Freq: Once | ORAL | Status: AC
  Administered 2021-07-01: 09:00:00 1000 mg via ORAL
  Filled 2021-07-01: qty 2

## 2021-07-01 NOTE — ED Notes (Signed)
Pt discharged home after verbalizing understanding of discharge instructions; nad noted. 

## 2021-07-01 NOTE — ED Triage Notes (Signed)
Pt via pov from home with body aches and cough since Saturday. Pt was exposed to the flu from family member who tested positive for flu on Friday. Pt alert & oriented, nad noted.

## 2021-07-01 NOTE — Discharge Instructions (Signed)
Your influenza A test is positive.  Recommend continued use of Tylenol 1000 mg every 6 hours as needed for fever, body aches.  You have been treated with a long-acting steroid.

## 2021-07-01 NOTE — ED Provider Notes (Signed)
[facility] Provider Note   CSN: [id number] Arrival date & time: 07/01/21  9024     History Chief Complaint  Patient presents with   Cough   Generalized Body Aches    [facility] is a 55 y.o. female.  The history is provided by the patient.  Cough Cough characteristics:  Non-productive Sputum characteristics:  Nondescript Severity:  Mild Onset quality:  Gradual Duration:  3 days Timing:  Intermittent Progression:  Waxing and waning Chronicity:  New Context: sick contacts   Relieved by:  Beta-agonist inhaler Associated symptoms: chills, fever and myalgias   Associated symptoms: no chest pain, no ear pain, no rash, no shortness of breath and no sore throat       Past Medical History:  Diagnosis Date   Allergy    seasonal allergies   Anemia    Anxiety    Arthritis    hands, left knee   COVID    Depression    Family history of adverse reaction to anesthesia    one sister ponv and slow to awaken   History of iron deficiency anemia    History of kidney stones    Hypertension    followed by pcp  (12-05-2019 pt had ETT 12-11-2011 , in epic, showed normal with no ischemia)   Nephrolithiasis    Panic disorder    PMB (postmenopausal bleeding)    Pre-diabetes    followed by pcp   Seasonal allergies    SUI (stress urinary incontinence, female)    Uterine fibroid    Wears glasses     Patient Active Problem List   Diagnosis Date Noted   Need for hepatitis C screening test 06/05/2021   Screening for HIV (human immunodeficiency virus) 06/05/2021   Pre-diabetes    Positive ANA (antinuclear antibody) 06/20/2020   Mild persistent asthma without complication 09/73/5329   Overweight (BMI 25.0-29.9) 06/14/2020   Status post bariatric surgery 06/16/2019   Vitamin D insufficiency 06/16/2019   Low back pain 06/22/2018   ASCUS pap with negative high risk HPV on 06/30/16 06/30/2016   Depression with anxiety 12/28/2015   Anemia, iron  deficiency 09/17/2013   Routine general medical examination at a health care facility 01/12/2013   Allergic rhinitis 01/12/2013   Constipation, chronic 10/04/2012   Visit for screening mammogram 01/12/2012   B12 deficiency anemia 01/09/2012   Obesity (BMI 30-39.9) 01/09/2012   Essential hypertension, benign 11/24/2011    Past Surgical History:  Procedure Laterality Date   BUNIONECTOMY Bilateral    CESAREAN SECTION  1989   CYSTOSCOPY W/ RETROGRADES Left 06/28/2018   Procedure: CYSTOSCOPY WITH RETROGRADE PYELOGRAM LEFT STENT;  Surgeon: Ceasar Mons, MD;  Location: [facility];  Service: Urology;  Laterality: Left;   CYSTOSCOPY/URETEROSCOPY/HOLMIUM LASER/STENT PLACEMENT Left 07/16/2018   Procedure: CYSTOSCOPY/URETEROSCOPY/HOLMIUM LASER/STENT EXCHANGE;  Surgeon: Ceasar Mons, MD;  Location: [facility];  Service: Urology;  Laterality: Left;   DILATATION & CURETTAGE/HYSTEROSCOPY WITH MYOSURE N/A 12/13/2019   Procedure: DILATATION & CURETTAGE/HYSTEROSCOPY WITH MYOSURE;  Surgeon: Salvadore Dom, MD;  Location: [facility];  Service: Gynecology;  Laterality: N/A;   DILITATION & CURRETTAGE/HYSTROSCOPY WITH NOVASURE ABLATION N/A 06/17/2013   Procedure: DILATATION & CURETTAGE/HYSTEROSCOPY WITH NOVASURE ABLATION;  Surgeon: Osborne Oman, MD;  Location: [facility];  Service: Gynecology;  Laterality: N/A;   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   Procedure: LAPAROSCOPIC ROUX-EN-Y GASTRIC BYPASS WITH UPPER ENDOSCOPY;  Surgeon: Greer Pickerel, MD;  Location: [facility];  Service: General;  Laterality: N/A;   HEMORRHOIDECTOMY WITH HEMORRHOID BANDING  10/19/2012   [facility] [place] [facility] Hem ligation/pexy   OPERATIVE ULTRASOUND N/A 12/13/2019   Procedure: OPERATIVE ULTRASOUND;  Surgeon: Salvadore Dom, MD;  Location: [facility];  Service: Gynecology;  Laterality: N/A;   Thumb surgery   04/2021   TUBAL LIGATION Bilateral 2001   UPPER GASTROINTESTINAL  ENDOSCOPY  10/11/2020   WISDOM TOOTH EXTRACTION       OB History     Gravida  7   Para  5   Term  4   Preterm  1   AB  2   Living  5      SAB  0   IAB  2   Ectopic  0   Multiple  0   Live Births  5           Family History  Problem Relation Age of Onset   Hypertension Mother    Hypothyroidism Mother    Rectal cancer Mother 47   Colon cancer Mother 47   Colon polyps Mother    Hypotension Sister    Hypothyroidism Sister    Diabetes Sister    Other Sister        pacemaker   Seizures Brother    Hypothyroidism Sister    Seizures Sister    Anemia Sister    Deep vein thrombosis Sister    Seizures Paternal Grandfather    Stroke Maternal Uncle        > 47   Diabetes Maternal Grandmother    Kidney failure Maternal Grandmother    Heart attack Maternal Grandfather    Other Paternal Grandmother        tumors throughout body   Hypertension Maternal Uncle    Esophageal cancer Neg Hx    Stomach cancer Neg Hx    Heart disease Neg Hx     Social History   Tobacco Use   Smoking status: Light Smoker    Years: 5.00    Types: Cigarettes    Last attempt to quit: 08/05/1999    Years since quitting: 21.9   Smokeless tobacco: Never   Tobacco comments:    Occasionally  Vaping Use   Vaping Use: Never used  Substance Use Topics   Alcohol use: Yes    Alcohol/week: 4.0 standard drinks    Types: 4 Glasses of wine per week   Drug use: No    Home Medications Prior to Admission medications   Medication Sig Start Date End Date Taking? Authorizing Provider  benzonatate (TESSALON) 100 MG capsule Take 1 capsule (100 mg total) by mouth every 8 (eight) hours. 07/01/21  Yes Lisle Skillman, DO  acetaminophen (TYLENOL) 500 MG tablet Take 500-1,000 mg by mouth as needed for headache or moderate pain.    [provider]  albuterol (VENTOLIN HFA) 108 (90 Base) MCG/ACT inhaler Inhale 1-2 puffs into the lungs every 6 (six) hours as needed for wheezing or shortness of  breath (for seasonal allergies). Patient taking differently: Inhale 1-2 puffs into the lungs as needed for wheezing or shortness of breath (for seasonal allergies). 06/15/20   [name], MD  APPLE CIDER VINEGAR PO Take 2 tablets by mouth daily.    [provider]  betamethasone valerate ointment (VALISONE) 0.1 % Apply a pea sized amount topically BID for up to 2 weeks as needed 06/24/21   Salvadore Dom, MD  Cholecalciferol (VITAMIN D3) 25 MCG (1000 UT) CHEW Chew 2 tablets by mouth  daily.    [provider]  escitalopram (LEXAPRO) 20 MG tablet Take 1 tablet by mouth once daily 12/11/20   [name], MD  HYDROcodone-acetaminophen (NORCO) 10-325 MG tablet Take 1 tablet by mouth 4 (four) times daily as needed. 06/13/21   [provider]  nebivolol (BYSTOLIC) 5 MG tablet Take 1 tablet by mouth once daily 03/26/21   [name], MD  phentermine 37.5 MG capsule Take 1 capsule (37.5 mg total) by mouth every morning. 06/05/21   [name], MD  sucralfate (CARAFATE) 1 GM/10ML suspension Take 10 mLs (1 g total) by mouth every 6 (six) hours as needed. 06/17/21   Yetta Flock, MD  triamterene-hydrochlorothiazide [facility]) 37.5-25 MG tablet Take 1 tablet by mouth once daily 05/01/21   [name], MD  potassium chloride (K-DUR) 10 MEQ tablet Take 1 tablet (10 mEq total) by mouth 2 (two) times daily. 05/13/12 06/27/13  [name], MD    Allergies    Naproxen  Review of Systems   Review of Systems  Constitutional:  Positive for chills and fever.  HENT:  Negative for ear pain and sore throat.   Eyes:  Negative for pain and visual disturbance.  Respiratory:  Positive for cough. Negative for shortness of breath.   Cardiovascular:  Negative for chest pain and palpitations.  Gastrointestinal:  Negative for abdominal pain and vomiting.  Genitourinary:  Negative for dysuria and hematuria.  Musculoskeletal:  Positive for myalgias. Negative for  arthralgias and back pain.  Skin:  Negative for color change and rash.  Neurological:  Negative for seizures and syncope.  All other systems reviewed and are negative.  Physical Exam Updated Vital Signs BP (!) 166/90 (BP Location: Right Arm)   Pulse (!) 110   Temp 99.6 F (37.6 C) (Oral)   Resp (!) 22   Ht 5\' 9"  (1.753 m)   Wt 96.4 kg   SpO2 97%   BMI 31.38 kg/m   Physical Exam Vitals and nursing note reviewed.  Constitutional:      General: She is not in acute distress.    Appearance: She is well-developed. She is not ill-appearing.  HENT:     Head: Normocephalic and atraumatic.  Eyes:     Extraocular Movements: Extraocular movements intact.     Conjunctiva/sclera: Conjunctivae normal.     Pupils: Pupils are equal, round, and reactive to light.  Cardiovascular:     Rate and Rhythm: Normal rate and regular rhythm.     Pulses: Normal pulses.     Heart sounds: Normal heart sounds. No murmur heard. Pulmonary:     Effort: Pulmonary effort is normal. No respiratory distress.     Breath sounds: Normal breath sounds.  Abdominal:     Palpations: Abdomen is soft.     Tenderness: There is no abdominal tenderness.  Musculoskeletal:        General: No swelling.     Cervical back: Neck supple.  Skin:    General: Skin is warm and dry.     Capillary Refill: Capillary refill takes less than 2 seconds.  Neurological:     General: No focal deficit present.     Mental Status: She is alert and oriented to person, place, and time.     Sensory: No sensory deficit.     Motor: No weakness.  Psychiatric:        Mood and Affect: Mood normal.    ED Results / Procedures / Treatments   Labs (  all labs ordered are listed, but only abnormal results are displayed) Labs Reviewed  RESP PANEL BY RT-PCR (FLU A&B, COVID) ARPGX2 - Abnormal; Notable for the following components:      Result Value   Influenza A by PCR POSITIVE (*)    All other components within normal limits     EKG None  Radiology DG Chest Portable 1 View  Result Date: 07/01/2021 CLINICAL DATA:  Cough.  Generalized body ache. EXAM: PORTABLE CHEST 1 VIEW COMPARISON:  06/14/2020 FINDINGS: The heart size and mediastinal contours are within normal limits. Both lungs are clear. The visualized skeletal structures are unremarkable. IMPRESSION: No active disease. Electronically Signed   By: Nelson Chimes M.D.   On: 07/01/2021 08:00    Procedures Procedures   Medications Ordered in ED Medications  acetaminophen (TYLENOL) tablet 1,000 mg (has no administration in time range)  dexamethasone (DECADRON) tablet 10 mg (has no administration in time range)    ED Course  I have reviewed the triage vital signs and the nursing notes.  Pertinent labs & imaging results that were available during my care of the patient were reviewed by me and considered in my medical decision making (see chart for details).    MDM Rules/Calculators/A&P                           YAZMYN VALBUENA is here with viral symptoms.  Positive for flu A.  Chest x-ray negative for pneumonia.  Overall reassuring vitals.  Recommend Tylenol.  Given Decadron for cough as well as Tessalon Perles.  Discharged in good condition.  No signs of respiratory distress.  Understands return precautions.  This chart was dictated using voice recognition software.  Despite best efforts to proofread,  errors can occur which can change the documentation meaning.   Final Clinical Impression(s) / ED Diagnoses Final diagnoses:  Influenza A    Rx / DC Orders ED Discharge Orders          Ordered    benzonatate (TESSALON) 100 MG capsule  Every 8 hours        07/01/21 0827             Lennice Sites, DO 07/01/21 6767

## 2021-07-09 ENCOUNTER — Other Ambulatory Visit: Payer: Self-pay | Admitting: Internal Medicine

## 2021-07-09 DIAGNOSIS — F418 Other specified anxiety disorders: Secondary | ICD-10-CM

## 2021-07-09 DIAGNOSIS — I10 Essential (primary) hypertension: Secondary | ICD-10-CM

## 2021-07-11 DIAGNOSIS — M79644 Pain in right finger(s): Secondary | ICD-10-CM | POA: Diagnosis not present

## 2021-07-11 DIAGNOSIS — M13842 Other specified arthritis, left hand: Secondary | ICD-10-CM | POA: Diagnosis not present

## 2021-08-21 DIAGNOSIS — M13841 Other specified arthritis, right hand: Secondary | ICD-10-CM | POA: Diagnosis not present

## 2021-08-21 DIAGNOSIS — M1811 Unilateral primary osteoarthritis of first carpometacarpal joint, right hand: Secondary | ICD-10-CM | POA: Diagnosis not present

## 2021-09-05 DIAGNOSIS — M13841 Other specified arthritis, right hand: Secondary | ICD-10-CM | POA: Diagnosis not present

## 2021-09-19 ENCOUNTER — Other Ambulatory Visit: Payer: Self-pay | Admitting: Internal Medicine

## 2021-09-19 DIAGNOSIS — Z1231 Encounter for screening mammogram for malignant neoplasm of breast: Secondary | ICD-10-CM

## 2021-09-30 ENCOUNTER — Other Ambulatory Visit: Payer: Self-pay

## 2021-09-30 ENCOUNTER — Ambulatory Visit
Admission: RE | Admit: 2021-09-30 | Discharge: 2021-09-30 | Disposition: A | Discharge status: Home / Self Care | Payer: Federal, State, Local not specified - PPO | Source: Ambulatory Visit

## 2021-09-30 DIAGNOSIS — Z1231 Encounter for screening mammogram for malignant neoplasm of breast: Secondary | ICD-10-CM

## 2021-10-03 ENCOUNTER — Other Ambulatory Visit: Payer: Self-pay | Admitting: Internal Medicine

## 2021-10-03 DIAGNOSIS — I10 Essential (primary) hypertension: Secondary | ICD-10-CM

## 2021-10-03 DIAGNOSIS — M79644 Pain in right finger(s): Secondary | ICD-10-CM | POA: Diagnosis not present

## 2021-10-03 DIAGNOSIS — M13841 Other specified arthritis, right hand: Secondary | ICD-10-CM | POA: Diagnosis not present

## 2021-10-03 DIAGNOSIS — E669 Obesity, unspecified: Secondary | ICD-10-CM

## 2021-10-15 DIAGNOSIS — M79644 Pain in right finger(s): Secondary | ICD-10-CM | POA: Diagnosis not present

## 2021-10-21 ENCOUNTER — Other Ambulatory Visit: Payer: Self-pay | Admitting: Internal Medicine

## 2021-10-21 DIAGNOSIS — F418 Other specified anxiety disorders: Secondary | ICD-10-CM

## 2021-10-22 ENCOUNTER — Other Ambulatory Visit: Payer: Self-pay | Admitting: Internal Medicine

## 2021-10-22 DIAGNOSIS — E669 Obesity, unspecified: Secondary | ICD-10-CM

## 2021-10-24 ENCOUNTER — Other Ambulatory Visit: Payer: Self-pay

## 2021-10-24 ENCOUNTER — Emergency Department (HOSPITAL_COMMUNITY)
Admission: EM | Admit: 2021-10-24 | Discharge: 2021-10-24 | Disposition: A | Discharge status: Home / Self Care | Payer: Federal, State, Local not specified - PPO | Attending: Emergency Medicine | Admitting: Emergency Medicine

## 2021-10-24 ENCOUNTER — Emergency Department (HOSPITAL_COMMUNITY): Payer: Federal, State, Local not specified - PPO

## 2021-10-24 ENCOUNTER — Encounter (HOSPITAL_COMMUNITY): Payer: Self-pay

## 2021-10-24 DIAGNOSIS — I10 Essential (primary) hypertension: Secondary | ICD-10-CM | POA: Diagnosis not present

## 2021-10-24 DIAGNOSIS — R202 Paresthesia of skin: Secondary | ICD-10-CM | POA: Diagnosis not present

## 2021-10-24 DIAGNOSIS — R29818 Other symptoms and signs involving the nervous system: Secondary | ICD-10-CM | POA: Diagnosis not present

## 2021-10-24 DIAGNOSIS — M79644 Pain in right finger(s): Secondary | ICD-10-CM | POA: Diagnosis not present

## 2021-10-24 DIAGNOSIS — Z79899 Other long term (current) drug therapy: Secondary | ICD-10-CM | POA: Insufficient documentation

## 2021-10-24 DIAGNOSIS — Z87891 Personal history of nicotine dependence: Secondary | ICD-10-CM | POA: Diagnosis not present

## 2021-10-24 DIAGNOSIS — R200 Anesthesia of skin: Secondary | ICD-10-CM | POA: Insufficient documentation

## 2021-10-24 LAB — BASIC METABOLIC PANEL
Anion gap: 7 (ref 5–15)
BUN: 14 mg/dL (ref 6–20)
CO2: 27 mmol/L (ref 22–32)
Calcium: 9.5 mg/dL (ref 8.9–10.3)
Chloride: 106 mmol/L (ref 98–111)
Creatinine, Ser: 0.75 mg/dL (ref 0.44–1.00)
GFR, Estimated: >60 mL/min (ref >60)
Glucose, Bld: 102 mg/dL — ABNORMAL HIGH (ref 70–99)
Potassium: 4.3 mmol/L (ref 3.5–5.1)
Sodium: 140 mmol/L (ref 135–145)

## 2021-10-24 LAB — CBC WITH DIFFERENTIAL/PLATELET
Abs Immature Granulocytes: 0.01 10*3/uL (ref 0.00–0.07)
Basophils Absolute: 0 10*3/uL (ref 0.0–0.1)
Basophils Relative: 1 %
Eosinophils Absolute: 0.2 10*3/uL (ref 0.0–0.5)
Eosinophils Relative: 3 %
HCT: 41.4 % (ref 36.0–46.0)
Hemoglobin: 13.4 g/dL (ref 12.0–15.0)
Immature Granulocytes: 0 %
Lymphocytes Relative: 52 %
Lymphs Abs: 2.5 10*3/uL (ref 0.7–4.0)
MCH: 23.9 pg — ABNORMAL LOW (ref 26.0–34.0)
MCHC: 32.4 g/dL (ref 30.0–36.0)
MCV: 73.9 fL — ABNORMAL LOW (ref 80.0–100.0)
Monocytes Absolute: 0.6 10*3/uL (ref 0.1–1.0)
Monocytes Relative: 12 %
Neutro Abs: 1.5 10*3/uL — ABNORMAL LOW (ref 1.7–7.7)
Neutrophils Relative %: 32 %
Platelets: 227 10*3/uL (ref 150–400)
RBC: 5.6 MIL/uL — ABNORMAL HIGH (ref 3.87–5.11)
RDW: 14 % (ref 11.5–15.5)
WBC: 4.8 10*3/uL (ref 4.0–10.5)
nRBC: 0 % (ref 0.0–0.2)

## 2021-10-24 NOTE — Discharge Instructions (Signed)
Please read and follow all provided instructions. ? ?Your diagnoses today include:  ?1. Right facial numbness   ? ?Tests performed today include: ?Complete blood cell count:  ?Basic metabolic panel:  ?MRI of your brain: Was very reassuring without signs of stroke or other problems ?Vital signs. See below for your results today.  ? ?Medications prescribed:  ?None ? ?Take any prescribed medications only as directed. ? ?Home care instructions:  ?Follow any educational materials contained in this packet. ? ?Follow-up instructions: ?Please follow-up with your primary care provider in the next 2-3 days for further evaluation of your symptoms.  ? ?Return instructions:  ?Please return to the Emergency Department if you experience worsening symptoms.  ?Return if you have weakness in your arms or legs, slurred speech, trouble walking or talking, confusion, or trouble with your balance.  ?Please return if you have any other emergent concerns. ? ?Additional Information: ? ?Your vital signs today were: ?BP (!) 145/82   Pulse 69   Temp 97.7 ?F (36.5 ?C) (Oral)   Resp 15   Ht '5\' 9"'$  (1.753 m)   Wt 93 kg   SpO2 100%   BMI 30.27 kg/m?  ?If your blood pressure (BP) was elevated above 135/85 this visit, please have this repeated by your doctor within one month. ?-------------- ? ?

## 2021-10-24 NOTE — ED Provider Notes (Addendum)
?[facility] ?Provider Note ? ? ?CSN: [id number] ?Arrival date & time: 10/24/21  0229 ? ?  ? ?History ? ?Chief Complaint  ?Patient presents with  ? Numbness  ? ? ?[facility] is a 56 y.o. female. ? ?Patient with history of hypertension compliant with medications, history of smoking --presents to the emergency department today for evaluation of acute onset of right-sided facial numbness.  This began around 1:45 AM.  She was lying in bed watching TV when all of a sudden she developed numbness in the right side of her face.  This includes her forehead, right cheek, and right jaw area.  She denies having weakness at any time.  No preceding taste changes.  She denies vision loss or vision change.  She has not had any weakness, numbness, or tingling in either arm or either leg.  Symptoms have been sensory only.  She was able to drink water without dribbling out.  Prior to his symptoms starting, she took her typical pain medication and muscle relaxer medication.  Patient has never had symptoms like this in the past.  She was concerned that she could be having a stroke, so she came to the emergency department for evaluation. ? ? ?  ? ?Home Medications ?Prior to Admission medications   ?Medication Sig Start Date End Date Taking? Authorizing Provider  ?acetaminophen (TYLENOL) 500 MG tablet Take 500-1,000 mg by mouth as needed for headache or moderate pain.    [provider]  ?albuterol (VENTOLIN HFA) 108 (90 Base) MCG/ACT inhaler Inhale 1-2 puffs into the lungs every 6 (six) hours as needed for wheezing or shortness of breath (for seasonal allergies). ?Patient taking differently: Inhale 1-2 puffs into the lungs as needed for wheezing or shortness of breath (for seasonal allergies). 06/15/20   [name], MD  ?APPLE CIDER VINEGAR PO Take 2 tablets by mouth daily.    [provider]  ?benzonatate (TESSALON) 100 MG capsule Take 1 capsule (100 mg total) by mouth  every 8 (eight) hours. 07/01/21   Curatolo, Adam, DO  ?betamethasone valerate ointment (VALISONE) 0.1 % Apply a pea sized amount topically BID for up to 2 weeks as needed 06/24/21   Salvadore Dom, MD  ?Cholecalciferol (VITAMIN D3) 25 MCG (1000 UT) CHEW Chew 2 tablets by mouth daily.    [provider]  ?escitalopram (LEXAPRO) 20 MG tablet Take 1 tablet by mouth once daily 10/21/21   [name], MD  ?HYDROcodone-acetaminophen (NORCO) 10-325 MG tablet Take 1 tablet by mouth 4 (four) times daily as needed. 06/13/21   [provider]  ?nebivolol (BYSTOLIC) 5 MG tablet Take 1 tablet by mouth once daily 10/04/21   [name], MD  ?phentermine 37.5 MG capsule Take 1 capsule by mouth in the morning 10/22/21   [name], MD  ?sucralfate (CARAFATE) 1 GM/10ML suspension Take 10 mLs (1 g total) by mouth every 6 (six) hours as needed. 06/17/21   Yetta Flock, MD  ?triamterene-hydrochlorothiazide [facility]) 37.5-25 MG tablet Take 1 tablet by mouth once daily 07/09/21   [name], MD  ?potassium chloride (K-DUR) 10 MEQ tablet Take 1 tablet (10 mEq total) by mouth 2 (two) times daily. 05/13/12 06/27/13  [name], MD  ?   ? ?Allergies    ?Naproxen   ? ?Review of Systems   ?Review of Systems ? ?Physical Exam ?Updated Vital Signs ?BP (!) 171/112   Pulse 77   Temp 97.7 ?  F (36.5 ?C) (Oral)   Resp 16   Ht '5\' 9"'$  (1.753 m)   Wt 93 kg   SpO2 99%   BMI 30.27 kg/m?  ?Physical Exam ?Vitals and nursing note reviewed.  ?Constitutional:   ?   Appearance: She is well-developed.  ?HENT:  ?   Head: Normocephalic and atraumatic.  ?   Right Ear: Tympanic membrane, ear canal and external ear normal.  ?   Left Ear: Tympanic membrane, ear canal and external ear normal.  ?   Nose: Nose normal.  ?   Mouth/Throat:  ?   Pharynx: Uvula midline.  ?Eyes:  ?   General: Lids are normal. No visual field deficit. ?   Extraocular Movements:  ?   Right eye: No nystagmus.  ?   Left eye: No nystagmus.  ?    Conjunctiva/sclera: Conjunctivae normal.  ?   Pupils: Pupils are equal, round, and reactive to light.  ?Cardiovascular:  ?   Rate and Rhythm: Normal rate and regular rhythm.  ?Pulmonary:  ?   Effort: Pulmonary effort is normal.  ?   Breath sounds: Normal breath sounds.  ?Abdominal:  ?   Palpations: Abdomen is soft.  ?   Tenderness: There is no abdominal tenderness.  ?Musculoskeletal:  ?   Cervical back: Normal range of motion and neck supple. No tenderness or bony tenderness.  ?Skin: ?   General: Skin is warm and dry.  ?Neurological:  ?   Mental Status: She is alert and oriented to person, place, and time.  ?   GCS: GCS eye subscore is 4. GCS verbal subscore is 5. GCS motor subscore is 6.  ?   Cranial Nerves: Cranial nerve deficit present. No dysarthria or facial asymmetry.  ?   Sensory: No sensory deficit.  ?   Motor: No weakness.  ?   Coordination: Coordination normal.  ?   Gait: Gait normal.  ?   Comments: Upper extremity myotomes tested bilaterally:  ?C5 Shoulder abduction 5/5 ?C6 Elbow flexion/wrist extension 5/5 ?C7 Elbow extension 5/5 ?C8 Finger flexion 5/5 ?T1 Finger abduction 5/5 ? ?Lower extremity myotomes tested bilaterally: ?L2 Hip flexion 5/5 ?L3 Knee extension 5/5 ?L4 Ankle dorsiflexion 5/5 ?S1 Ankle plantar flexion 5/5 ? ?I tested light touch sensation on face and forehead bilaterally.  Patient does report being able to feel on the right side but much diminished compared to the left.  Same with sharp sensation. ?  ? ? ?ED Results / Procedures / Treatments   ?Labs ?(all labs ordered are listed, but only abnormal results are displayed) ?Labs Reviewed  ?CBC WITH DIFFERENTIAL/PLATELET - Abnormal; Notable for the following components:  ?    Result Value  ? RBC 5.60 (*)   ? MCV 73.9 (*)   ? MCH 23.9 (*)   ? Neutro Abs 1.5 (*)   ? All other components within normal limits  ?BASIC METABOLIC PANEL  ? ? ?ED ECG REPORT ? ? Date: 10/24/2021 ? Rate: 73 ? Rhythm: normal sinus rhythm ? QRS Axis: normal ?  Intervals: normal ? ST/T Wave abnormalities: normal ? Conduction Disutrbances:none ? Narrative Interpretation:  ? Old EKG Reviewed: unchanged from 5/21 ? ?I have personally reviewed the EKG tracing and agree with the computerized printout as noted. ? ? ?Radiology ?MR BRAIN WO CONTRAST ? ?Result Date: 10/24/2021 ?CLINICAL DATA:  Neuro deficit with acute stroke suspected. Acute onset right facial numbness EXAM: MRI HEAD WITHOUT CONTRAST TECHNIQUE: Multiplanar, multiecho pulse sequences of the brain and surrounding  structures were obtained without intravenous contrast. COMPARISON:  Head CT 08/25/2020 FINDINGS: Brain: No acute infarction, hemorrhage, hydrocephalus, extra-axial collection or mass lesion. No white matter disease or atrophy Vascular: Normal flow voids. Skull and upper cervical spine: Normal marrow signal. Sinuses/Orbits: Negative IMPRESSION: Negative brain MRI.  No explanation for symptoms. Electronically Signed   By: Jorje Guild M.D.   On: 10/24/2021 05:19   ? ?Procedures ?Procedures  ? ? ?Medications Ordered in ED ?Medications - No data to display ? ?ED Course/ Medical Decision Making/ A&P ?  ? ?Patient seen and examined. History obtained directly from patient.  ? ?Considered code stroke activation however patient's symptoms are sensory only, very minor.  Discussed with Dr. Betsey Holiday. ? ?I went back and had a discussion with patient.  We discussed checking labs and monitoring her symptoms.  If her symptoms remain sensory only, she could likely follow-up as an outpatient with her primary care doctor and potentially a neurologist.  However she would need to be willing to return if her symptoms changed or worsened.  We also discussed utility of MRI and obtaining imaging to evaluate for the possibility of a stroke.  I discussed with the patient that overall we feel that her symptoms are unlikely to be due to a stroke, but cannot entirely rule this out.  Patient's preference is to obtain MRI to evaluate for  the possibility of a stroke. ? ?Labs/EKG: Ordered CBC, BMP, EKG. ? ?Imaging: Ordered MRI brain. ? ?Medications/Fluids: None ordered ?Most recent vital signs reviewed and are as follows: ?BP (!) 171/112   Pulse 77   Tem

## 2021-10-24 NOTE — ED Triage Notes (Signed)
Pt reports right facial numbness that started 30 mins PTA. Denies other symptoms. Strong equal grip bilaterally, no facial droop, speaking clear complete sentences. She reports she took a hydrocodone and a muscle relaxer prior to the onset of her symptoms. Pt ambulatory with independent steady gait. ?

## 2021-10-31 DIAGNOSIS — M13841 Other specified arthritis, right hand: Secondary | ICD-10-CM | POA: Diagnosis not present

## 2021-11-13 ENCOUNTER — Telehealth: Payer: Self-pay

## 2021-11-13 NOTE — Telephone Encounter (Signed)
Key: [id number] ? ?Approved ? valid from 10/14/2021 through 05/12/2022. ?

## 2021-11-14 ENCOUNTER — Other Ambulatory Visit: Payer: Self-pay | Admitting: Internal Medicine

## 2021-11-14 DIAGNOSIS — I10 Essential (primary) hypertension: Secondary | ICD-10-CM

## 2021-11-20 NOTE — Progress Notes (Deleted)
56 y.o. [id number] Married Black or [place] American Not Hispanic or Latino female here for annual exam.      No LMP recorded. Patient has had an ablation.          Sexually active: {yes no:314532}  The current method of family planning is {contraception:315051}.    Exercising: {yes no:314532}  {types:19826} Smoker:  {YES NO:22349}  Health Maintenance: Pap:  07/06/19 WNL HR HPV Neg 06/30/16 ASCUS HR HPV Neg History of abnormal Pap:  yes ASCUS MMG:  10/01/21 density A Birads 1 neg  BMD:   n/a Colonoscopy: 11/05/20 f/u/5 years  TDaP:  01/09/12  Gardasil: na   reports that she has been smoking cigarettes. She has never used smokeless tobacco. She reports current alcohol use of about 4.0 standard drinks per week. She reports that she does not use drugs.  Past Medical History:  Diagnosis Date   Allergy    seasonal allergies   Anemia    Anxiety    Arthritis    hands, left knee   COVID    Depression    Family history of adverse reaction to anesthesia    one sister ponv and slow to awaken   History of iron deficiency anemia    History of kidney stones    Hypertension    followed by pcp  (12-05-2019 pt had ETT 12-11-2011 , in epic, showed normal with no ischemia)   Nephrolithiasis    Panic disorder    PMB (postmenopausal bleeding)    Pre-diabetes    followed by pcp   Seasonal allergies    SUI (stress urinary incontinence, female)    Uterine fibroid    Wears glasses     Past Surgical History:  Procedure Laterality Date   BUNIONECTOMY Bilateral    CESAREAN SECTION  1989   CYSTOSCOPY W/ RETROGRADES Left 06/28/2018   Procedure: CYSTOSCOPY WITH RETROGRADE PYELOGRAM LEFT STENT;  Surgeon: Ceasar Mons, MD;  Location: [facility];  Service: Urology;  Laterality: Left;   CYSTOSCOPY/URETEROSCOPY/HOLMIUM LASER/STENT PLACEMENT Left 07/16/2018   Procedure: CYSTOSCOPY/URETEROSCOPY/HOLMIUM LASER/STENT EXCHANGE;  Surgeon: Ceasar Mons, MD;  Location: [facility];  Service: Urology;  Laterality: Left;   DILATATION & CURETTAGE/HYSTEROSCOPY WITH MYOSURE N/A 12/13/2019   Procedure: DILATATION & CURETTAGE/HYSTEROSCOPY WITH MYOSURE;  Surgeon: Salvadore Dom, MD;  Location: [facility];  Service: Gynecology;  Laterality: N/A;   DILITATION & CURRETTAGE/HYSTROSCOPY WITH NOVASURE ABLATION N/A 06/17/2013   Procedure: DILATATION & CURETTAGE/HYSTEROSCOPY WITH NOVASURE ABLATION;  Surgeon: Osborne Oman, MD;  Location: [facility];  Service: Gynecology;  Laterality: N/A;   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   Procedure: LAPAROSCOPIC ROUX-EN-Y GASTRIC BYPASS WITH UPPER ENDOSCOPY;  Surgeon: Greer Pickerel, MD;  Location: [facility];  Service: General;  Laterality: N/A;   HEMORRHOIDECTOMY WITH HEMORRHOID BANDING  10/19/2012   [facility] [place] Hem ligation/pexy   OPERATIVE ULTRASOUND N/A 12/13/2019   Procedure: OPERATIVE ULTRASOUND;  Surgeon: Salvadore Dom, MD;  Location: [facility];  Service: Gynecology;  Laterality: N/A;   Thumb surgery   04/2021   TUBAL LIGATION Bilateral 2001   UPPER GASTROINTESTINAL ENDOSCOPY  10/11/2020   WISDOM TOOTH EXTRACTION      Current Outpatient Medications  Medication Sig Dispense Refill   acetaminophen (TYLENOL) 500 MG tablet Take 500-1,000 mg by mouth as needed for headache or moderate pain.     albuterol (VENTOLIN HFA) 108 (90 Base) MCG/ACT inhaler Inhale 1-2 puffs into the lungs every 6 (six) hours as needed for  wheezing or shortness of breath (for seasonal allergies). (Patient taking differently: Inhale 1-2 puffs into the lungs as needed for wheezing or shortness of breath (for seasonal allergies).) 18 g 3   APPLE CIDER VINEGAR PO Take 2 tablets by mouth daily.     benzonatate (TESSALON) 100 MG capsule Take 1 capsule (100 mg total) by mouth every 8 (eight) hours. 21 capsule 0   betamethasone valerate ointment (VALISONE) 0.1 % Apply a pea sized amount topically BID for up to 2 weeks as needed 30 g 0    Cholecalciferol (VITAMIN D3) 25 MCG (1000 UT) CHEW Chew 2 tablets by mouth daily.     escitalopram (LEXAPRO) 20 MG tablet Take 1 tablet by mouth once daily 90 tablet 1   HYDROcodone-acetaminophen (NORCO) 10-325 MG tablet Take 1 tablet by mouth 4 (four) times daily as needed.     nebivolol (BYSTOLIC) 5 MG tablet Take 1 tablet by mouth once daily 90 tablet 0   phentermine 37.5 MG capsule Take 1 capsule by mouth in the morning 30 capsule 0   sucralfate (CARAFATE) 1 GM/10ML suspension Take 10 mLs (1 g total) by mouth every 6 (six) hours as needed. 420 mL 1   triamterene-hydrochlorothiazide (MAXZIDE-25) 37.5-25 MG tablet Take 1 tablet by mouth once daily 90 tablet 0   No current facility-administered medications for this visit.    Family History  Problem Relation Age of Onset   Hypertension Mother    Hypothyroidism Mother    Rectal cancer Mother 77   Colon cancer Mother 33   Colon polyps Mother    Hypotension Sister    Hypothyroidism Sister    Diabetes Sister    Other Sister        pacemaker   Seizures Brother    Hypothyroidism Sister    Seizures Sister    Anemia Sister    Deep vein thrombosis Sister    Seizures Paternal Grandfather    Stroke Maternal Uncle        > 106   Diabetes Maternal Grandmother    Kidney failure Maternal Grandmother    Heart attack Maternal Grandfather    Other Paternal Grandmother        tumors throughout body   Hypertension Maternal Uncle    Esophageal cancer Neg Hx    Stomach cancer Neg Hx    Heart disease Neg Hx     Review of Systems  Exam:   There were no vitals taken for this visit.  Weight change: '@WEIGHTCHANGE'$ @ Height:      Ht Readings from Last 3 Encounters:  10/24/21 '5\' 9"'$  (1.753 m)  07/01/21 '5\' 9"'$  (1.753 m)  06/24/21 '5\' 9"'$  (1.753 m)    General appearance: alert, cooperative and appears stated age Head: Normocephalic, without obvious abnormality, atraumatic Neck: no adenopathy, supple, symmetrical, trachea midline and thyroid {CHL AMB  PHY EX THYROID NORM DEFAULT:[id number]::"normal to inspection and palpation"} Lungs: clear to auscultation bilaterally Cardiovascular: regular rate and rhythm Breasts: {Exam; breast:13139::"normal appearance, no masses or tenderness"} Abdomen: soft, non-tender; non distended,  no masses,  no organomegaly Extremities: extremities normal, atraumatic, no cyanosis or edema Skin: Skin color, texture, turgor normal. No rashes or lesions Lymph nodes: Cervical, supraclavicular, and axillary nodes normal. No abnormal inguinal nodes palpated Neurologic: Grossly normal   Pelvic: External genitalia:  no lesions              Urethra:  normal appearing urethra with no masses, tenderness or lesions  Bartholins and Skenes: normal                 Vagina: normal appearing vagina with normal color and discharge, no lesions              Cervix: {CHL AMB PHY EX CERVIX NORM DEFAULT:[phone]::"no lesions"}               Bimanual Exam:  Uterus:  {CHL AMB PHY EX UTERUS NORM DEFAULT:[phone]::"normal size, contour, position, consistency, mobility, non-tender"}              Adnexa: {CHL AMB PHY EX ADNEXA NO MASS [phone]::"no mass, fullness, tenderness"}               Rectovaginal: Confirms               Anus:  normal sphincter tone, no lesions  *** chaperoned for the exam.  A:  Well Woman with normal exam  P:

## 2021-11-28 ENCOUNTER — Ambulatory Visit: Payer: Federal, State, Local not specified - PPO | Admitting: Obstetrics and Gynecology

## 2021-11-28 DIAGNOSIS — Z0289 Encounter for other administrative examinations: Secondary | ICD-10-CM

## 2021-12-12 DIAGNOSIS — M13842 Other specified arthritis, left hand: Secondary | ICD-10-CM | POA: Diagnosis not present

## 2021-12-12 DIAGNOSIS — M13841 Other specified arthritis, right hand: Secondary | ICD-10-CM | POA: Diagnosis not present

## 2021-12-13 ENCOUNTER — Other Ambulatory Visit: Payer: Self-pay | Admitting: Internal Medicine

## 2021-12-13 DIAGNOSIS — E669 Obesity, unspecified: Secondary | ICD-10-CM

## 2021-12-18 ENCOUNTER — Other Ambulatory Visit: Payer: Self-pay | Admitting: Internal Medicine

## 2021-12-18 ENCOUNTER — Encounter: Payer: Self-pay | Admitting: Internal Medicine

## 2021-12-18 DIAGNOSIS — E669 Obesity, unspecified: Secondary | ICD-10-CM

## 2021-12-23 ENCOUNTER — Ambulatory Visit: Payer: Federal, State, Local not specified - PPO | Admitting: Internal Medicine

## 2021-12-26 ENCOUNTER — Ambulatory Visit: Payer: Federal, State, Local not specified - PPO | Admitting: Internal Medicine

## 2021-12-26 ENCOUNTER — Encounter: Payer: Self-pay | Admitting: Internal Medicine

## 2021-12-26 VITALS — BP 138/84 | HR 71 | Temp 97.9°F | Resp 16 | Ht 69.0 in | Wt 209.0 lb

## 2021-12-26 DIAGNOSIS — E669 Obesity, unspecified: Secondary | ICD-10-CM | POA: Diagnosis not present

## 2021-12-26 DIAGNOSIS — D508 Other iron deficiency anemias: Secondary | ICD-10-CM | POA: Diagnosis not present

## 2021-12-26 DIAGNOSIS — Z0000 Encounter for general adult medical examination without abnormal findings: Secondary | ICD-10-CM | POA: Diagnosis not present

## 2021-12-26 DIAGNOSIS — M545 Other chronic pain: Secondary | ICD-10-CM | POA: Insufficient documentation

## 2021-12-26 DIAGNOSIS — D510 Vitamin B12 deficiency anemia due to intrinsic factor deficiency: Secondary | ICD-10-CM | POA: Diagnosis not present

## 2021-12-26 DIAGNOSIS — I10 Essential (primary) hypertension: Secondary | ICD-10-CM | POA: Diagnosis not present

## 2021-12-26 DIAGNOSIS — R7303 Prediabetes: Secondary | ICD-10-CM | POA: Diagnosis not present

## 2021-12-26 DIAGNOSIS — M159 Polyosteoarthritis, unspecified: Secondary | ICD-10-CM

## 2021-12-26 DIAGNOSIS — Z23 Encounter for immunization: Secondary | ICD-10-CM | POA: Diagnosis not present

## 2021-12-26 DIAGNOSIS — G8929 Other chronic pain: Secondary | ICD-10-CM

## 2021-12-26 LAB — BASIC METABOLIC PANEL
BUN: 15 mg/dL (ref 6–23)
CO2: 28 mEq/L (ref 19–32)
Calcium: 9.6 mg/dL (ref 8.4–10.5)
Chloride: 104 mEq/L (ref 96–112)
Creatinine, Ser: 0.73 mg/dL (ref 0.40–1.20)
GFR: 93.1 mL/min (ref >60.00)
Glucose, Bld: 104 mg/dL — ABNORMAL HIGH (ref 70–99)
Potassium: 3.8 mEq/L (ref 3.5–5.1)
Sodium: 140 mEq/L (ref 135–145)

## 2021-12-26 LAB — VITAMIN B12: Vitamin B-12: 376 pg/mL (ref 211–911)

## 2021-12-26 LAB — LIPID PANEL
Cholesterol: 142 mg/dL (ref 0–200)
HDL: 61.6 mg/dL (ref >39.00)
LDL Cholesterol: 56 mg/dL (ref 0–99)
NonHDL: 80.2
Total CHOL/HDL Ratio: 2
Triglycerides: 122 mg/dL (ref 0.0–149.0)
VLDL: 24.4 mg/dL (ref 0.0–40.0)

## 2021-12-26 LAB — IBC + FERRITIN
Ferritin: 55.9 ng/mL (ref 10.0–291.0)
Iron: 105 ug/dL (ref 42–145)
Saturation Ratios: 24.5 % (ref 20.0–50.0)
TIBC: 428.4 ug/dL (ref 250.0–450.0)
Transferrin: 306 mg/dL (ref 212.0–360.0)

## 2021-12-26 LAB — HEMOGLOBIN A1C: Hgb A1c MFr Bld: 6.2 % (ref 4.6–6.5)

## 2021-12-26 LAB — FOLATE: Folate: 11.1 ng/mL (ref >5.9)

## 2021-12-26 MED ORDER — PHENTERMINE HCL 37.5 MG PO CAPS: 37.5000 mg | ORAL_CAPSULE | Freq: Every morning | ORAL | 2 refills | Status: DC

## 2021-12-26 MED ORDER — CELECOXIB 50 MG PO CAPS: 50.0000 mg | ORAL_CAPSULE | Freq: Two times a day (BID) | ORAL | 1 refills | Status: DC

## 2021-12-26 NOTE — Patient Instructions (Signed)

## 2021-12-26 NOTE — Progress Notes (Unsigned)
Subjective:  Patient ID: Mary Sanford, female    DOB: 12-Nov-1965  Age: 56 y.o. MRN: [id number]  CC: Annual Exam, Hypertension, Back Pain, and Osteoarthritis   HPI Mary Sanford presents for a CPX and f/up -   She is active and denies chest pain, shortness of breath, diaphoresis, dizziness lightheadedness, or edema.  She continues to complain of nonradiating low back pain and pain in her large joints.  Outpatient Medications Prior to Visit  Medication Sig Dispense Refill   acetaminophen (TYLENOL) 500 MG tablet Take 500-1,000 mg by mouth as needed for headache or moderate pain.     albuterol (VENTOLIN HFA) 108 (90 Base) MCG/ACT inhaler Inhale 1-2 puffs into the lungs every 6 (six) hours as needed for wheezing or shortness of breath (for seasonal allergies). (Patient taking differently: Inhale 1-2 puffs into the lungs as needed for wheezing or shortness of breath (for seasonal allergies).) 18 g 3   APPLE CIDER VINEGAR PO Take 2 tablets by mouth daily.     benzonatate (TESSALON) 100 MG capsule Take 1 capsule (100 mg total) by mouth every 8 (eight) hours. 21 capsule 0   betamethasone valerate ointment (VALISONE) 0.1 % Apply a pea sized amount topically BID for up to 2 weeks as needed 30 g 0   Cholecalciferol (VITAMIN D3) 25 MCG (1000 UT) CHEW Chew 2 tablets by mouth daily.     escitalopram (LEXAPRO) 20 MG tablet Take 1 tablet by mouth once daily 90 tablet 1   HYDROcodone-acetaminophen (NORCO) 10-325 MG tablet Take 1 tablet by mouth 4 (four) times daily as needed.     nebivolol (BYSTOLIC) 5 MG tablet Take 1 tablet by mouth once daily 90 tablet 0   sucralfate (CARAFATE) 1 GM/10ML suspension Take 10 mLs (1 g total) by mouth every 6 (six) hours as needed. 420 mL 1   triamterene-hydrochlorothiazide (MAXZIDE-25) 37.5-25 MG tablet Take 1 tablet by mouth once daily 90 tablet 0   phentermine 37.5 MG capsule Take 1 capsule by mouth in the morning 30 capsule 0   No facility-administered medications  prior to visit.    ROS Review of Systems  Constitutional: Negative.  Negative for chills, diaphoresis, fatigue and fever.  HENT: Negative.    Eyes: Negative.   Respiratory: Negative.  Negative for cough, chest tightness, shortness of breath and wheezing.   Cardiovascular:  Negative for chest pain, palpitations and leg swelling.  Gastrointestinal:  Negative for abdominal pain, constipation, diarrhea, nausea and vomiting.  Genitourinary: Negative.  Negative for difficulty urinating.  Musculoskeletal:  Positive for arthralgias and back pain. Negative for joint swelling and myalgias.  Skin: Negative.   Neurological: Negative.  Negative for dizziness, weakness and light-headedness.  Hematological:  Negative for adenopathy. Does not bruise/bleed easily.  Psychiatric/Behavioral: Negative.     Objective:  BP 138/84 (BP Location: Right Arm, Patient Position: Sitting, Cuff Size: Large)   Pulse 71   Temp 97.9 F (36.6 C) (Oral)   Resp 16   Ht '5\' 9"'$  (1.753 m)   Wt 209 lb (94.8 kg)   SpO2 96%   BMI 30.86 kg/m   BP Readings from Last 3 Encounters:  12/26/21 138/84  10/24/21 (!) 145/82  07/01/21 (!) 146/92    Wt Readings from Last 3 Encounters:  12/26/21 209 lb (94.8 kg)  10/24/21 205 lb (93 kg)  07/01/21 212 lb 8.4 oz (96.4 kg)    Physical Exam Vitals reviewed.  HENT:     Nose: Nose normal.  Mouth/Throat:     Mouth: Mucous membranes are moist.  Eyes:     General: No scleral icterus.    Conjunctiva/sclera: Conjunctivae normal.  Cardiovascular:     Rate and Rhythm: Normal rate and regular rhythm.     Heart sounds: No murmur heard. Pulmonary:     Effort: Pulmonary effort is normal.     Breath sounds: No stridor. No wheezing, rhonchi or rales.  Abdominal:     General: Abdomen is flat.     Palpations: There is no mass.     Tenderness: There is no abdominal tenderness. There is no guarding.     Hernia: No hernia is present.  Musculoskeletal:        General: Deformity  (DJD) present. No swelling or tenderness.     Cervical back: Neck supple.     Right lower leg: No edema.     Left lower leg: No edema.  Lymphadenopathy:     Cervical: No cervical adenopathy.  Skin:    General: Skin is warm and dry.  Neurological:     General: No focal deficit present.     Mental Status: She is alert. Mental status is at baseline.  Psychiatric:        Mood and Affect: Mood normal.        Behavior: Behavior normal.    Lab Results  Component Value Date   WBC 4.8 10/24/2021   HGB 13.4 10/24/2021   HCT 41.4 10/24/2021   PLT 227 10/24/2021   GLUCOSE 104 (H) 12/26/2021   CHOL 142 12/26/2021   TRIG 122.0 12/26/2021   HDL 61.60 12/26/2021   LDLCALC 56 12/26/2021   ALT 23 01/04/2021   AST 29 01/04/2021   NA 140 12/26/2021   K 3.8 12/26/2021   CL 104 12/26/2021   CREATININE 0.73 12/26/2021   BUN 15 12/26/2021   CO2 28 12/26/2021   TSH 0.64 01/04/2021   HGBA1C 6.2 12/26/2021    MR BRAIN WO CONTRAST  Result Date: 10/24/2021 CLINICAL DATA:  Neuro deficit with acute stroke suspected. Acute onset right facial numbness EXAM: MRI HEAD WITHOUT CONTRAST TECHNIQUE: Multiplanar, multiecho pulse sequences of the brain and surrounding structures were obtained without intravenous contrast. COMPARISON:  Head CT 08/25/2020 FINDINGS: Brain: No acute infarction, hemorrhage, hydrocephalus, extra-axial collection or mass lesion. No white matter disease or atrophy Vascular: Normal flow voids. Skull and upper cervical spine: Normal marrow signal. Sinuses/Orbits: Negative IMPRESSION: Negative brain MRI.  No explanation for symptoms. Electronically Signed   By: Mary Sanford M.D.   On: 10/24/2021 05:19    Assessment & Plan:   Mary Sanford was seen today for annual exam, hypertension, back pain and osteoarthritis.  Diagnoses and all orders for this visit:  Essential hypertension, benign- Her blood pressure is well controlled. -     Basic metabolic panel; Future -     Basic metabolic  panel  Pre-diabetes- Her A1c is at 6.2%. 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 -     Vitamin B12; Future -     Folate; Future -     Folate -     Vitamin B12  Routine general medical examination at a health care facility- Exam completed, labs reviewed, vaccines reviewed and updated, cancer screenings are up-to-date, patient  education material was given. -     Lipid panel; Future -     Lipid panel  Obesity (BMI 30-39.9) -     phentermine 37.5 MG capsule; Take 1 capsule (37.5 mg total) by mouth every morning.  Primary osteoarthritis involving multiple joints -     celecoxib (CELEBREX) 50 MG capsule; Take 1 capsule (50 mg total) by mouth 2 (two) times daily.  Chronic bilateral low back pain without sciatica -     celecoxib (CELEBREX) 50 MG capsule; Take 1 capsule (50 mg total) by mouth 2 (two) times daily.  Other iron deficiency anemia -     IBC + Ferritin; Future -     IBC + Ferritin  Other orders -     Tdap vaccine greater than or equal to 7yo IM -     Varicella-zoster vaccine IM (Shingrix)   I have changed Olivia Mackie D. Dearing's phentermine. I am also having her start on celecoxib. Additionally, I am having her maintain her albuterol, acetaminophen, Vitamin D3, APPLE CIDER VINEGAR PO, HYDROcodone-acetaminophen, sucralfate, betamethasone valerate ointment, benzonatate, nebivolol, escitalopram, and triamterene-hydrochlorothiazide.  Meds ordered this encounter  Medications   phentermine 37.5 MG capsule    Sig: Take 1 capsule (37.5 mg total) by mouth every morning.    Dispense:  30 capsule    Refill:  2   celecoxib (CELEBREX) 50 MG capsule    Sig: Take 1 capsule (50 mg total) by mouth 2 (two) times daily.    Dispense:  180 capsule    Refill:  1     Follow-up: Return in about 6 months (around  06/28/2022).  Scarlette Calico, MD

## 2021-12-31 NOTE — Progress Notes (Deleted)
56 y.o. [id number] Married Black or [place] American Not Hispanic or Latino female here for annual exam.      No LMP recorded. Patient has had an ablation.          Sexually active: {yes no:314532}  The current method of family planning is {contraception:315051}.    Exercising: {yes no:314532}  {types:19826} Smoker:  {YES P5382123  Health Maintenance: Pap:  07/06/2019 WNL Hr HPV Neg, 2017 ASCUS HPV NR neg, no other abnormal history History of abnormal Pap:  no MMG:  10/01/21 Density A Bi-rads 1 neg  BMD:   none  Colonoscopy: 11/05/20 f/u 5 years  TDaP:  12/26/21  Gardasil: n/a   reports that she has been smoking cigarettes. She has never used smokeless tobacco. She reports current alcohol use of about 4.0 standard drinks per week. She reports that she does not use drugs.  Past Medical History:  Diagnosis Date   Allergy    seasonal allergies   Anemia    Anxiety    Arthritis    hands, left knee   COVID    Depression    Family history of adverse reaction to anesthesia    one sister ponv and slow to awaken   History of iron deficiency anemia    History of kidney stones    Hypertension    followed by pcp  (12-05-2019 pt had ETT 12-11-2011 , in epic, showed normal with no ischemia)   Nephrolithiasis    Panic disorder    PMB (postmenopausal bleeding)    Pre-diabetes    followed by pcp   Seasonal allergies    SUI (stress urinary incontinence, female)    Uterine fibroid    Wears glasses     Past Surgical History:  Procedure Laterality Date   BUNIONECTOMY Bilateral    CESAREAN SECTION  1989   CYSTOSCOPY W/ RETROGRADES Left 06/28/2018   Procedure: CYSTOSCOPY WITH RETROGRADE PYELOGRAM LEFT STENT;  Surgeon: Ceasar Mons, MD;  Location: [facility];  Service: Urology;  Laterality: Left;   CYSTOSCOPY/URETEROSCOPY/HOLMIUM LASER/STENT PLACEMENT Left 07/16/2018   Procedure: CYSTOSCOPY/URETEROSCOPY/HOLMIUM LASER/STENT EXCHANGE;  Surgeon: Ceasar Mons, MD;  [facility]  Service: Urology;  Laterality: Left;   DILATATION & CURETTAGE/HYSTEROSCOPY WITH MYOSURE N/A 12/13/2019   Procedure: DILATATION & CURETTAGE/HYSTEROSCOPY WITH MYOSURE;  Surgeon: Salvadore Dom, MD;  Location: [facility];  Service: Gynecology;  Laterality: N/A;   DILITATION & CURRETTAGE/HYSTROSCOPY WITH NOVASURE ABLATION N/A 06/17/2013   Procedure: DILATATION & CURETTAGE/HYSTEROSCOPY WITH NOVASURE ABLATION;  Surgeon: Osborne Oman, MD;  Location: [facility];  Service: Gynecology;  Laterality: N/A;   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   Procedure: LAPAROSCOPIC ROUX-EN-Y GASTRIC BYPASS WITH UPPER ENDOSCOPY;  Surgeon: Greer Pickerel, MD;  Location: [facility];  Service: General;  Laterality: N/A;   HEMORRHOIDECTOMY WITH HEMORRHOID BANDING  10/19/2012   [facility] [place] Hem ligation/pexy   OPERATIVE ULTRASOUND N/A 12/13/2019   Procedure: OPERATIVE ULTRASOUND;  Surgeon: Salvadore Dom, MD;  [facility]: [facility];  Service: Gynecology;  Laterality: N/A;   Thumb surgery   04/2021   TUBAL LIGATION Bilateral 2001   UPPER GASTROINTESTINAL ENDOSCOPY  10/11/2020   WISDOM TOOTH EXTRACTION      Current Outpatient Medications  Medication Sig Dispense Refill   acetaminophen (TYLENOL) 500 MG tablet Take 500-1,000 mg by mouth as needed for headache or moderate pain.     albuterol (VENTOLIN HFA) 108 (90 Base) MCG/ACT inhaler Inhale 1-2 puffs into the lungs every 6 (  six) hours as needed for wheezing or shortness of breath (for seasonal allergies). (Patient taking differently: Inhale 1-2 puffs into the lungs as needed for wheezing or shortness of breath (for seasonal allergies).) 18 g 3   APPLE CIDER VINEGAR PO Take 2 tablets by mouth daily.     benzonatate (TESSALON) 100 MG capsule Take 1 capsule (100 mg total) by mouth every 8 (eight) hours. 21 capsule 0   betamethasone valerate ointment (VALISONE) 0.1 % Apply a pea sized amount topically BID for up to 2 weeks  as needed 30 g 0   celecoxib (CELEBREX) 50 MG capsule Take 1 capsule (50 mg total) by mouth 2 (two) times daily. 180 capsule 1   Cholecalciferol (VITAMIN D3) 25 MCG (1000 UT) CHEW Chew 2 tablets by mouth daily.     escitalopram (LEXAPRO) 20 MG tablet Take 1 tablet by mouth once daily 90 tablet 1   HYDROcodone-acetaminophen (NORCO) 10-325 MG tablet Take 1 tablet by mouth 4 (four) times daily as needed.     nebivolol (BYSTOLIC) 5 MG tablet Take 1 tablet by mouth once daily 90 tablet 0   phentermine 37.5 MG capsule Take 1 capsule (37.5 mg total) by mouth every morning. 30 capsule 2   sucralfate (CARAFATE) 1 GM/10ML suspension Take 10 mLs (1 g total) by mouth every 6 (six) hours as needed. 420 mL 1   triamterene-hydrochlorothiazide (MAXZIDE-25) 37.5-25 MG tablet Take 1 tablet by mouth once daily 90 tablet 0   No current facility-administered medications for this visit.    Family History  Problem Relation Age of Onset   Hypertension Mother    Hypothyroidism Mother    Rectal cancer Mother 51   Colon cancer Mother 43   Colon polyps Mother    Hypotension Sister    Hypothyroidism Sister    Diabetes Sister    Other Sister        pacemaker   Seizures Brother    Hypothyroidism Sister    Seizures Sister    Anemia Sister    Deep vein thrombosis Sister    Seizures Paternal Grandfather    Stroke Maternal Uncle        > 106   Diabetes Maternal Grandmother    Kidney failure Maternal Grandmother    Heart attack Maternal Grandfather    Other Paternal Grandmother        tumors throughout body   Hypertension Maternal Uncle    Esophageal cancer Neg Hx    Stomach cancer Neg Hx    Heart disease Neg Hx     Review of Systems  Exam:   There were no vitals taken for this visit.  Weight change: '@WEIGHTCHANGE'$ @ Height:      Ht Readings from Last 3 Encounters:  12/26/21 '5\' 9"'$  (1.753 m)  10/24/21 '5\' 9"'$  (1.753 m)  07/01/21 '5\' 9"'$  (1.753 m)    General appearance: alert, cooperative and appears stated  age Head: Normocephalic, without obvious abnormality, atraumatic Neck: no adenopathy, supple, symmetrical, trachea midline and thyroid {CHL AMB PHY EX THYROID NORM DEFAULT:[phone]::"normal to inspection and palpation"} Lungs: clear to auscultation bilaterally Cardiovascular: regular rate and rhythm Breasts: {Exam; breast:13139::"normal appearance, no masses or tenderness"} Abdomen: soft, non-tender; non distended,  no masses,  no organomegaly Extremities: extremities normal, atraumatic, no cyanosis or edema Skin: Skin color, texture, turgor normal. No rashes or lesions Lymph nodes: Cervical, supraclavicular, and axillary nodes normal. No abnormal inguinal nodes palpated Neurologic: Grossly normal   Pelvic: External genitalia:  no lesions  Urethra:  normal appearing urethra with no masses, tenderness or lesions              Bartholins and Skenes: normal                 Vagina: normal appearing vagina with normal color and discharge, no lesions              Cervix: {CHL AMB PHY EX CERVIX NORM [phone]::"no lesions"}               Bimanual Exam:  Uterus:  {CHL AMB PHY EX UTERUS NORM DEFAULT:[id number]::"normal size, contour, position, consistency, mobility, non-tender"}              Adnexa: {CHL AMB PHY EX ADNEXA NO MASS DEFAULT:[phone]::"no mass, fullness, tenderness"}               Rectovaginal: Confirms               Anus:  normal sphincter tone, no lesions  *** chaperoned for the exam.  A:  Well Woman with normal exam  P:

## 2022-01-08 ENCOUNTER — Ambulatory Visit: Payer: Federal, State, Local not specified - PPO | Admitting: Obstetrics and Gynecology

## 2022-01-08 ENCOUNTER — Ambulatory Visit: Payer: Federal, State, Local not specified - PPO | Admitting: Gastroenterology

## 2022-01-08 DIAGNOSIS — Z0289 Encounter for other administrative examinations: Secondary | ICD-10-CM

## 2022-01-16 ENCOUNTER — Encounter (HOSPITAL_COMMUNITY): Payer: Self-pay | Admitting: *Deleted

## 2022-02-03 ENCOUNTER — Other Ambulatory Visit: Payer: Self-pay | Admitting: Internal Medicine

## 2022-02-03 DIAGNOSIS — I10 Essential (primary) hypertension: Secondary | ICD-10-CM

## 2022-02-19 DIAGNOSIS — E669 Obesity, unspecified: Secondary | ICD-10-CM | POA: Diagnosis not present

## 2022-02-19 DIAGNOSIS — I10 Essential (primary) hypertension: Secondary | ICD-10-CM | POA: Diagnosis not present

## 2022-02-19 DIAGNOSIS — Z9884 Bariatric surgery status: Secondary | ICD-10-CM | POA: Diagnosis not present

## 2022-02-19 DIAGNOSIS — E559 Vitamin D deficiency, unspecified: Secondary | ICD-10-CM | POA: Diagnosis not present

## 2022-02-20 ENCOUNTER — Ambulatory Visit: Payer: Federal, State, Local not specified - PPO | Admitting: Skilled Nursing Facility1

## 2022-02-25 ENCOUNTER — Other Ambulatory Visit: Payer: Self-pay | Admitting: Internal Medicine

## 2022-02-25 DIAGNOSIS — I10 Essential (primary) hypertension: Secondary | ICD-10-CM

## 2022-03-14 ENCOUNTER — Other Ambulatory Visit: Payer: Self-pay

## 2022-03-14 ENCOUNTER — Encounter (HOSPITAL_COMMUNITY): Payer: Self-pay

## 2022-03-14 ENCOUNTER — Emergency Department (HOSPITAL_COMMUNITY)
Admission: EM | Admit: 2022-03-14 | Discharge: 2022-03-14 | Disposition: A | Discharge status: Home / Self Care | Payer: Federal, State, Local not specified - PPO | Attending: Emergency Medicine | Admitting: Emergency Medicine

## 2022-03-14 ENCOUNTER — Emergency Department (HOSPITAL_COMMUNITY): Payer: Federal, State, Local not specified - PPO

## 2022-03-14 DIAGNOSIS — Z9889 Other specified postprocedural states: Secondary | ICD-10-CM | POA: Diagnosis not present

## 2022-03-14 DIAGNOSIS — N200 Calculus of kidney: Secondary | ICD-10-CM | POA: Diagnosis not present

## 2022-03-14 DIAGNOSIS — R109 Unspecified abdominal pain: Secondary | ICD-10-CM

## 2022-03-14 LAB — COMPREHENSIVE METABOLIC PANEL
ALT: 30 U/L (ref 0–44)
AST: 27 U/L (ref 15–41)
Albumin: 4.4 g/dL (ref 3.5–5.0)
Alkaline Phosphatase: 72 U/L (ref 38–126)
Anion gap: 12 (ref 5–15)
BUN: 12 mg/dL (ref 6–20)
CO2: 27 mmol/L (ref 22–32)
Calcium: 10 mg/dL (ref 8.9–10.3)
Chloride: 101 mmol/L (ref 98–111)
Creatinine, Ser: 0.82 mg/dL (ref 0.44–1.00)
GFR, Estimated: >60 mL/min (ref >60)
Glucose, Bld: 103 mg/dL — ABNORMAL HIGH (ref 70–99)
Potassium: 3.7 mmol/L (ref 3.5–5.1)
Sodium: 140 mmol/L (ref 135–145)
Total Bilirubin: 0.8 mg/dL (ref 0.3–1.2)
Total Protein: 7.7 g/dL (ref 6.5–8.1)

## 2022-03-14 LAB — CBC
HCT: 43.4 % (ref 36.0–46.0)
Hemoglobin: 13.7 g/dL (ref 12.0–15.0)
MCH: 23.4 pg — ABNORMAL LOW (ref 26.0–34.0)
MCHC: 31.6 g/dL (ref 30.0–36.0)
MCV: 74.1 fL — ABNORMAL LOW (ref 80.0–100.0)
Platelets: 239 10*3/uL (ref 150–400)
RBC: 5.86 MIL/uL — ABNORMAL HIGH (ref 3.87–5.11)
RDW: 14.2 % (ref 11.5–15.5)
WBC: 8.2 10*3/uL (ref 4.0–10.5)
nRBC: 0 % (ref 0.0–0.2)

## 2022-03-14 LAB — URINALYSIS, ROUTINE W REFLEX MICROSCOPIC
Bilirubin Urine: NEGATIVE
Glucose, UA: NEGATIVE mg/dL
Hgb urine dipstick: NEGATIVE
Ketones, ur: NEGATIVE mg/dL
Leukocytes,Ua: NEGATIVE
Nitrite: NEGATIVE
Protein, ur: NEGATIVE mg/dL
Specific Gravity, Urine: 1.01 (ref 1.005–1.030)
pH: 6 (ref 5.0–8.0)

## 2022-03-14 MED ORDER — OXYCODONE-ACETAMINOPHEN 5-325 MG PO TABS: 1.0000 | ORAL_TABLET | Freq: Four times a day (QID) | ORAL | 0 refills | Status: DC | PRN

## 2022-03-14 MED ORDER — TAMSULOSIN HCL 0.4 MG PO CAPS: 0.4000 mg | ORAL_CAPSULE | Freq: Every evening | ORAL | 0 refills | Status: DC

## 2022-03-14 MED ORDER — KETOROLAC TROMETHAMINE 15 MG/ML IJ SOLN
15.0000 mg | Freq: Once | INTRAMUSCULAR | Status: AC
  Administered 2022-03-14: 15 mg via INTRAVENOUS
  Filled 2022-03-14: qty 1

## 2022-03-14 NOTE — Discharge Instructions (Addendum)
You were seen in the emergency department for flank pain.  As we discussed, your scan showed a 2 millimeter kidney stone on the right side. This should pass on its own. I am prescribing you a course of pain medication and a medication called Flomax, which helps to dilate your urinary tract.   Please follow up with your urologist next week. Return to the ER for new or worsening symptoms such as pain despite medication, urinary discomfort, or fever.

## 2022-03-14 NOTE — ED Triage Notes (Signed)
Patient c/o constant right flank pain x 1 week. Patient endorses known kidney stones.

## 2022-03-14 NOTE — ED Provider Notes (Signed)
[place] DEPT Provider Note   CSN: [id number] Arrival date & time: 03/14/22  1120     History  Chief Complaint  Patient presents with   Flank Pain    Mary Sanford is a 56 y.o. female who presents emergency department complaining of right flank pain x1 week.  Patient states the pain is constant, and made worse with movement.  She has been trying Tylenol and ibuprofen without relief.  Endorses known history of kidney stones, as previously needed a stent.  Tried contacting her urologist who would not be able to get an appointment until next week, and they recommended she go to the ER if her symptoms were that severe.  She describes the pain as sharp burning sensation in her right flank and right lower back, does not radiate to her abdomen or groin.  No dysuria or hematuria.  Is currently perimenopausal, and having hot flashes, not sure if these could be fevers as she had not checked.   Flank Pain Pertinent negatives include no abdominal pain.       Home Medications Prior to Admission medications   Medication Sig Start Date End Date Taking? Authorizing Provider  oxyCODONE-acetaminophen (PERCOCET/ROXICET) 5-325 MG tablet Take 1 tablet by mouth every 6 (six) hours as needed for severe pain. 03/14/22  Yes Norrine Ballester T, PA-C  tamsulosin (FLOMAX) 0.4 MG CAPS capsule Take 1 capsule (0.4 mg total) by mouth at bedtime. Take daily until stone expulsion 03/14/22  Yes Catharine Kettlewell T, PA-C  acetaminophen (TYLENOL) 500 MG tablet Take 500-1,000 mg by mouth as needed for headache or moderate pain.    [provider]  albuterol (VENTOLIN HFA) 108 (90 Base) MCG/ACT inhaler Inhale 1-2 puffs into the lungs every 6 (six) hours as needed for wheezing or shortness of breath (for seasonal allergies). Patient taking differently: Inhale 1-2 puffs into the lungs as needed for wheezing or shortness of breath (for seasonal allergies). 06/15/20   [name], MD   APPLE CIDER VINEGAR PO Take 2 tablets by mouth daily.    [provider]  benzonatate (TESSALON) 100 MG capsule Take 1 capsule (100 mg total) by mouth every 8 (eight) hours. 07/01/21   Curatolo, Adam, DO  betamethasone valerate ointment (VALISONE) 0.1 % Apply a pea sized amount topically BID for up to 2 weeks as needed 06/24/21   Salvadore Dom, MD  celecoxib (CELEBREX) 50 MG capsule Take 1 capsule (50 mg total) by mouth 2 (two) times daily. 12/26/21   [name], MD  Cholecalciferol (VITAMIN D3) 25 MCG (1000 UT) CHEW Chew 2 tablets by mouth daily.    [provider]  escitalopram (LEXAPRO) 20 MG tablet Take 1 tablet by mouth once daily 10/21/21   [name], MD  nebivolol (BYSTOLIC) 5 MG tablet Take 1 tablet by mouth once daily 02/03/22   [name], MD  phentermine 37.5 MG capsule Take 1 capsule (37.5 mg total) by mouth every morning. 12/26/21   [name], MD  sucralfate (CARAFATE) 1 GM/10ML suspension Take 10 mLs (1 g total) by mouth every 6 (six) hours as needed. 06/17/21   Yetta Flock, MD  triamterene-hydrochlorothiazide [facility]) 37.5-25 MG tablet Take 1 tablet by mouth once daily 02/25/22   [name], MD  potassium chloride (K-DUR) 10 MEQ tablet Take 1 tablet (10 mEq total) by mouth 2 (two) times daily. 05/13/12 06/27/13  [name], MD      Allergies  Naproxen    Review of Systems   Review of Systems  Constitutional:  Positive for chills.  Gastrointestinal:  Negative for abdominal pain, nausea and vomiting.  Genitourinary:  Positive for flank pain. Negative for dysuria, hematuria and pelvic pain.  Musculoskeletal:  Positive for back pain.  All other systems reviewed and are negative.   Physical Exam Updated Vital Signs BP (!) 153/100   Pulse (!) 104   Temp 98.9 F (37.2 C) (Oral)   Resp 18   Ht '5\' 9"'$  (1.753 m)   Wt 92.5 kg   SpO2 100%   BMI 30.13 kg/m  Physical Exam Vitals and nursing note reviewed.   Constitutional:      Appearance: Normal appearance.  HENT:     Head: Normocephalic and atraumatic.  Eyes:     Conjunctiva/sclera: Conjunctivae normal.  Cardiovascular:     Rate and Rhythm: Normal rate and regular rhythm.  Pulmonary:     Effort: Pulmonary effort is normal. No respiratory distress.     Breath sounds: Normal breath sounds.  Abdominal:     General: There is no distension.     Palpations: Abdomen is soft.     Tenderness: There is no abdominal tenderness. There is right CVA tenderness. There is no left CVA tenderness.  Skin:    General: Skin is warm and dry.  Neurological:     General: No focal deficit present.     Mental Status: She is alert.     ED Results / Procedures / Treatments   Labs (all labs ordered are listed, but only abnormal results are displayed) Labs Reviewed  URINALYSIS, ROUTINE W REFLEX MICROSCOPIC - Abnormal; Notable for the following components:      Result Value   Color, Urine STRAW (*)    All other components within normal limits  CBC - Abnormal; Notable for the following components:   RBC 5.86 (*)    MCV 74.1 (*)    MCH 23.4 (*)    All other components within normal limits  COMPREHENSIVE METABOLIC PANEL - Abnormal; Notable for the following components:   Glucose, Bld 103 (*)    All other components within normal limits    EKG None  Radiology CT Renal Stone Study  Result Date: 03/14/2022 CLINICAL DATA:  Right flank pain EXAM: CT ABDOMEN AND PELVIS WITHOUT CONTRAST TECHNIQUE: Multidetector CT imaging of the abdomen and pelvis was performed following the standard protocol without IV contrast. RADIATION DOSE REDUCTION: This exam was performed according to the departmental dose-optimization program which includes automated exposure control, adjustment of the mA and/or kV according to patient size and/or use of iterative reconstruction technique. COMPARISON:  06/27/2018 FINDINGS: Lower chest: No acute abnormality. Hepatobiliary: Unremarkable  unenhanced appearance of the liver. No focal liver lesion identified. Gallbladder within normal limits. No hyperdense gallstone. No biliary dilatation. Pancreas: Unremarkable. No pancreatic ductal dilatation or surrounding inflammatory changes. Spleen: Normal in size without focal abnormality. Adrenals/Urinary Tract: Unremarkable adrenal glands. Punctate 1-2 mm nonobstructing stone within the interpolar region of the right kidney. No hydronephrosis. No left-sided renal calculi. No ureteral calculi are identified. Urinary bladder within normal limits. 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 Appendix appears normal. No evidence of bowel wall thickening, distention, or inflammatory changes. Vascular/Lymphatic: No significant vascular findings are present. No enlarged abdominal or pelvic lymph nodes. Reproductive: Uterus and bilateral adnexa are unremarkable. Other: No free fluid. No abdominopelvic fluid collection. No pneumoperitoneum. No abdominal wall hernia. Musculoskeletal: No acute or significant osseous findings. IMPRESSION:  1. Punctate 1-2 mm nonobstructing right renal stone. 2. Otherwise, no acute abdominopelvic findings. Electronically Signed   By: Davina Poke D.O.   On: 03/14/2022 13:58    Procedures Procedures    Medications Ordered in ED Medications  ketorolac (TORADOL) 15 MG/ML injection 15 mg (15 mg Intravenous Given 03/14/22 1340)    ED Course/ Medical Decision Making/ A&P Clinical Course as of 03/14/22 1432  Fri Mar 14, 2022  6116 Is a 56 year old female presenting with right-sided flank pain.  History of kidney stones.  Renal study concerning with recurrent punctate nephrolithiasis.  Labs reassuring, no evidence of pyelo or metabolic abnormality. [CC]    Clinical Course User Index [CC] Tretha Sciara, MD                           Medical Decision Making Amount and/or Complexity of Data Reviewed Labs: ordered. Radiology:  ordered.  Risk Prescription drug management.   This patient is a 56 y.o. female  who presents to the ED for concern of right flank pain x 1 week. Known hx of kidney stones.    Differential diagnoses prior to evaluation: The emergent differential diagnosis includes, but is not limited to,  AAA, renal artery/vein embolism/thrombosis, mesenteric ischemia, pyelonephritis, nephrolithiasis, cystitis, biliary colic, pancreatitis, perforated peptic ulcer, appendicitis. This is not an exhaustive differential.   Past Medical History / Co-morbidities: Anxiety, iron deficiency anemia, prediabetes, stress incontinence, hypertension, uterine fibroids, depression, kidney stones s/p spent placement in 2019  Additional history: PDMP reviewed.  Physical Exam: Physical exam performed. The pertinent findings include: Patient persistently tachycardic in the low 100s.  Afebrile.  Appears very uncomfortable in the exam bed, with right CVA tenderness.  Abdomen soft without tenderness.  Lab Tests/Imaging studies: I personally interpreted labs/imaging and the pertinent results include: No leukocytosis, normal hemoglobin.  Urinalysis without hematuria or infection.  CMP with normal electrolytes, normal kidney and liver function.   CT renal stone study shows a 1 to 2 mm nonobstructing right renal stone. I agree with the radiologist interpretation.  Medications: I ordered medication including toradol.  I have reviewed the patients home medicines and have made adjustments as needed.   Disposition: After consideration of the diagnostic results and the patients response to treatment, I feel that emergency department workup does not suggest an emergent condition requiring admission or immediate intervention beyond what has been performed at this time. The plan is: discharge with prescriptions for pain medication and tamsulosin with urology follow up. The patient is safe for discharge and has been instructed to return  immediately for worsening symptoms, change in symptoms or any other concerns.         Final Clinical Impression(s) / ED Diagnoses Final diagnoses:  Right flank pain  Nephrolithiasis    Rx / DC Orders ED Discharge Orders          Ordered    oxyCODONE-acetaminophen (PERCOCET/ROXICET) 5-325 MG tablet  Every 6 hours PRN        03/14/22 1431    tamsulosin (FLOMAX) 0.4 MG CAPS capsule  Nightly        03/14/22 1431           Portions of this report may have been transcribed using voice recognition software. Every effort was made to ensure accuracy; however, inadvertent computerized transcription errors may be present.    Estill Cotta 03/14/22 1432    Tretha Sciara, MD 03/14/22 [phone]

## 2022-03-17 DIAGNOSIS — B9620 Unspecified Escherichia coli [E. coli] as the cause of diseases classified elsewhere: Secondary | ICD-10-CM | POA: Diagnosis not present

## 2022-03-17 DIAGNOSIS — N390 Urinary tract infection, site not specified: Secondary | ICD-10-CM | POA: Diagnosis not present

## 2022-03-17 DIAGNOSIS — R1084 Generalized abdominal pain: Secondary | ICD-10-CM | POA: Diagnosis not present

## 2022-04-14 ENCOUNTER — Ambulatory Visit: Payer: Federal, State, Local not specified - PPO | Admitting: Skilled Nursing Facility1

## 2022-04-16 ENCOUNTER — Other Ambulatory Visit: Payer: Self-pay | Admitting: Internal Medicine

## 2022-04-16 DIAGNOSIS — E669 Obesity, unspecified: Secondary | ICD-10-CM

## 2022-05-05 ENCOUNTER — Encounter: Payer: Self-pay | Admitting: Internal Medicine

## 2022-05-05 ENCOUNTER — Ambulatory Visit (INDEPENDENT_AMBULATORY_CARE_PROVIDER_SITE_OTHER): Payer: Federal, State, Local not specified - PPO | Admitting: Internal Medicine

## 2022-05-05 VITALS — BP 132/82 | HR 90 | Temp 98.7°F | Ht 69.0 in | Wt 196.0 lb

## 2022-05-05 DIAGNOSIS — I10 Essential (primary) hypertension: Secondary | ICD-10-CM

## 2022-05-05 DIAGNOSIS — Z23 Encounter for immunization: Secondary | ICD-10-CM

## 2022-05-05 DIAGNOSIS — R8290 Unspecified abnormal findings in urine: Secondary | ICD-10-CM | POA: Insufficient documentation

## 2022-05-05 DIAGNOSIS — B9620 Unspecified Escherichia coli [E. coli] as the cause of diseases classified elsewhere: Secondary | ICD-10-CM

## 2022-05-05 DIAGNOSIS — R051 Acute cough: Secondary | ICD-10-CM | POA: Diagnosis not present

## 2022-05-05 DIAGNOSIS — M545 Low back pain, unspecified: Secondary | ICD-10-CM | POA: Diagnosis not present

## 2022-05-05 DIAGNOSIS — R413 Other amnesia: Secondary | ICD-10-CM | POA: Insufficient documentation

## 2022-05-05 DIAGNOSIS — G8929 Other chronic pain: Secondary | ICD-10-CM

## 2022-05-05 DIAGNOSIS — N390 Urinary tract infection, site not specified: Secondary | ICD-10-CM

## 2022-05-05 LAB — POC COVID19 BINAXNOW: SARS Coronavirus 2 Ag: NEGATIVE

## 2022-05-05 MED ORDER — SCOPOLAMINE 1 MG/3DAYS TD PT72: 1.0000 | MEDICATED_PATCH | TRANSDERMAL | 0 refills | Status: DC

## 2022-05-05 NOTE — Progress Notes (Unsigned)
Subjective:  Patient ID: Mary Sanford, female    DOB: 1966-07-05  Age: 56 y.o. MRN: [id number]  CC: Cough and Back Pain   HPI OLIVIAROSE PUNCH presents for f/up -  She has chronic, unchanged, nonradiating low back pain and requests a refill of Percocet.  She also complains of a 2-day history of nonproductive cough but denies fever, chills, night sweats, sore throat, chest pain, or shortness of breath.  She also complains of cloudy urine and wants motion sickness patches.  Outpatient Medications Prior to Visit  Medication Sig Dispense Refill   acetaminophen (TYLENOL) 500 MG tablet Take 500-1,000 mg by mouth as needed for headache or moderate pain.     albuterol (VENTOLIN HFA) 108 (90 Base) MCG/ACT inhaler Inhale 1-2 puffs into the lungs every 6 (six) hours as needed for wheezing or shortness of breath (for seasonal allergies). (Patient taking differently: Inhale 1-2 puffs into the lungs as needed for wheezing or shortness of breath (for seasonal allergies).) 18 g 3   APPLE CIDER VINEGAR PO Take 2 tablets by mouth daily.     betamethasone valerate ointment (VALISONE) 0.1 % Apply a pea sized amount topically BID for up to 2 weeks as needed 30 g 0   celecoxib (CELEBREX) 50 MG capsule Take 1 capsule (50 mg total) by mouth 2 (two) times daily. 180 capsule 1   Cholecalciferol (VITAMIN D3) 25 MCG (1000 UT) CHEW Chew 2 tablets by mouth daily.     escitalopram (LEXAPRO) 20 MG tablet Take 1 tablet by mouth once daily 90 tablet 1   nebivolol (BYSTOLIC) 5 MG tablet Take 1 tablet by mouth once daily 90 tablet 0   phentermine 37.5 MG capsule Take 1 capsule by mouth in the morning 30 capsule 0   sucralfate (CARAFATE) 1 GM/10ML suspension Take 10 mLs (1 g total) by mouth every 6 (six) hours as needed. 420 mL 1   tamsulosin (FLOMAX) 0.4 MG CAPS capsule Take 1 capsule (0.4 mg total) by mouth at bedtime. Take daily until stone expulsion 30 capsule 0   triamterene-hydrochlorothiazide (MAXZIDE-25) 37.5-25 MG  tablet Take 1 tablet by mouth once daily 90 tablet 0   benzonatate (TESSALON) 100 MG capsule Take 1 capsule (100 mg total) by mouth every 8 (eight) hours. 21 capsule 0   oxyCODONE-acetaminophen (PERCOCET/ROXICET) 5-325 MG tablet Take 1 tablet by mouth every 6 (six) hours as needed for severe pain. 15 tablet 0   No facility-administered medications prior to visit.    ROS Review of Systems  Constitutional: Negative.  Negative for chills, diaphoresis, fatigue and fever.  HENT: Negative.  Negative for sore throat and trouble swallowing.   Eyes:  Negative for visual disturbance.  Respiratory:  Positive for cough. Negative for chest tightness, shortness of breath and wheezing.   Cardiovascular:  Negative for chest pain, palpitations and leg swelling.  Gastrointestinal: Negative.  Negative for abdominal pain, constipation, diarrhea, nausea and vomiting.  Genitourinary:  Positive for dysuria. Negative for decreased urine volume, difficulty urinating, hematuria and urgency.  Musculoskeletal:  Positive for back pain.  Skin: Negative.  Negative for color change and pallor.  Neurological:  Negative for dizziness, weakness, light-headedness and headaches.  Hematological:  Negative for adenopathy. Does not bruise/bleed easily.  Psychiatric/Behavioral: Negative.      Objective:  BP 132/82 (BP Location: Left Arm, Patient Position: Sitting, Cuff Size: Large)   Pulse 90   Temp 98.7 F (37.1 C) (Oral)   Ht '5\' 9"'$  (1.753 m)   Wt  196 lb (88.9 kg)   SpO2 98%   BMI 28.94 kg/m   BP Readings from Last 3 Encounters:  05/05/22 132/82  03/14/22 (!) 166/110  12/26/21 138/84    Wt Readings from Last 3 Encounters:  05/05/22 196 lb (88.9 kg)  03/14/22 204 lb (92.5 kg)  12/26/21 209 lb (94.8 kg)    Physical Exam Vitals reviewed.  Constitutional:      General: She is not in acute distress.    Appearance: She is not ill-appearing, toxic-appearing or diaphoretic.  HENT:     Nose: Nose normal.      Mouth/Throat:     Mouth: Mucous membranes are moist.  Eyes:     General: No scleral icterus.    Conjunctiva/sclera: Conjunctivae normal.  Cardiovascular:     Pulses: Normal pulses.     Heart sounds: No murmur heard.    No gallop.  Pulmonary:     Effort: Pulmonary effort is normal.     Breath sounds: No stridor. No wheezing, rhonchi or rales.  Abdominal:     General: Abdomen is flat.     Palpations: There is no mass.     Tenderness: There is no abdominal tenderness. There is no guarding.     Hernia: No hernia is present.  Musculoskeletal:        General: Normal range of motion.     Cervical back: Neck supple.     Right lower leg: No edema.     Left lower leg: No edema.  Lymphadenopathy:     Cervical: No cervical adenopathy.  Skin:    General: Skin is warm and dry.     Findings: No rash.  Neurological:     General: No focal deficit present.     Mental Status: She is alert.  Psychiatric:        Mood and Affect: Mood normal.        Behavior: Behavior normal.     Lab Results  Component Value Date   WBC 8.2 03/14/2022   HGB 13.7 03/14/2022   HCT 43.4 03/14/2022   PLT 239 03/14/2022   GLUCOSE 103 (H) 03/14/2022   CHOL 142 12/26/2021   TRIG 122.0 12/26/2021   HDL 61.60 12/26/2021   LDLCALC 56 12/26/2021   ALT 30 03/14/2022   AST 27 03/14/2022   NA 140 03/14/2022   K 3.7 03/14/2022   CL 101 03/14/2022   CREATININE 0.82 03/14/2022   BUN 12 03/14/2022   CO2 27 03/14/2022   TSH 0.64 01/04/2021   HGBA1C 6.2 12/26/2021    CT Renal Stone Study  Result Date: 03/14/2022 CLINICAL DATA:  Right flank pain EXAM: CT ABDOMEN AND PELVIS WITHOUT CONTRAST TECHNIQUE: Multidetector CT imaging of the abdomen and pelvis was performed following the standard protocol without IV contrast. RADIATION DOSE REDUCTION: This exam was performed according to the departmental dose-optimization program which includes automated exposure control, adjustment of the mA and/or kV according to patient  size and/or use of iterative reconstruction technique. COMPARISON:  06/27/2018 FINDINGS: Lower chest: No acute abnormality. Hepatobiliary: Unremarkable unenhanced appearance of the liver. No focal liver lesion identified. Gallbladder within normal limits. No hyperdense gallstone. No biliary dilatation. Pancreas: Unremarkable. No pancreatic ductal dilatation or surrounding inflammatory changes. Spleen: Normal in size without focal abnormality. Adrenals/Urinary Tract: Unremarkable adrenal glands. Punctate 1-2 mm nonobstructing stone within the interpolar region of the right kidney. No hydronephrosis. No left-sided renal calculi. No ureteral calculi are identified. Urinary bladder within normal limits. Stomach/Bowel: Postsurgical changes from  prior Roux-en-Y gastric bypass. Appendix appears normal. No evidence of bowel wall thickening, distention, or inflammatory changes. Vascular/Lymphatic: No significant vascular findings are present. No enlarged abdominal or pelvic lymph nodes. Reproductive: Uterus and bilateral adnexa are unremarkable. Other: No free fluid. No abdominopelvic fluid collection. No pneumoperitoneum. No abdominal wall hernia. Musculoskeletal: No acute or significant osseous findings. IMPRESSION: 1. Punctate 1-2 mm nonobstructing right renal stone. 2. Otherwise, no acute abdominopelvic findings. Electronically Signed   By: Davina Poke D.O.   On: 03/14/2022 13:58    Assessment & Plan:   Kalyiah was seen today for cough and back pain.  Diagnoses and all orders for this visit:  Acute cough -     POC COVID-19  Essential hypertension, benign  Chronic bilateral low back pain without sciatica -     oxyCODONE-acetaminophen (PERCOCET/ROXICET) 5-325 MG tablet; Take 1 tablet by mouth every 8 (eight) hours as needed for severe pain.  Cloudy urine -     CULTURE, URINE COMPREHENSIVE; Future -     Urinalysis, Routine w reflex microscopic; Future -     Urinalysis, Routine w reflex microscopic -      CULTURE, URINE COMPREHENSIVE  Memory changes -     Ambulatory referral to Neurology  Other orders -     Flu Vaccine QUAD 6+ mos PF IM (Fluarix Quad PF) -     Zoster Recombinant (Shingrix ) -     scopolamine (TRANSDERM-SCOP) 1 MG/3DAYS; Place 1 patch (1.5 mg total) onto the skin every 3 (three) days.   I have discontinued Eliseo Gum. Ratliff's benzonatate. I have also changed her oxyCODONE-acetaminophen. Additionally, I am having her start on scopolamine. Lastly, I am having her maintain her albuterol, acetaminophen, Vitamin D3, APPLE CIDER VINEGAR PO, sucralfate, betamethasone valerate ointment, escitalopram, celecoxib, nebivolol, triamterene-hydrochlorothiazide, tamsulosin, and phentermine.  Meds ordered this encounter  Medications   scopolamine (TRANSDERM-SCOP) 1 MG/3DAYS    Sig: Place 1 patch (1.5 mg total) onto the skin every 3 (three) days.    Dispense:  4 patch    Refill:  0   oxyCODONE-acetaminophen (PERCOCET/ROXICET) 5-325 MG tablet    Sig: Take 1 tablet by mouth every 8 (eight) hours as needed for severe pain.    Dispense:  60 tablet    Refill:  0     Follow-up: Return in about 3 months (around 08/05/2022).  Scarlette Calico, MD

## 2022-05-05 NOTE — Patient Instructions (Signed)

## 2022-05-06 LAB — URINALYSIS, ROUTINE W REFLEX MICROSCOPIC
Bilirubin Urine: NEGATIVE
Hgb urine dipstick: NEGATIVE
Leukocytes,Ua: NEGATIVE
Nitrite: NEGATIVE
RBC / HPF: NONE SEEN (ref 0–?)
Specific Gravity, Urine: 1.025 (ref 1.000–1.030)
Total Protein, Urine: NEGATIVE
Urine Glucose: NEGATIVE
Urobilinogen, UA: 0.2 (ref 0.0–1.0)
pH: 6 (ref 5.0–8.0)

## 2022-05-06 MED ORDER — OXYCODONE-ACETAMINOPHEN 5-325 MG PO TABS: 1.0000 | ORAL_TABLET | Freq: Three times a day (TID) | ORAL | 0 refills | Status: DC | PRN

## 2022-05-08 DIAGNOSIS — N390 Urinary tract infection, site not specified: Secondary | ICD-10-CM | POA: Insufficient documentation

## 2022-05-08 LAB — CULTURE, URINE COMPREHENSIVE

## 2022-05-08 MED ORDER — SULFAMETHOXAZOLE-TRIMETHOPRIM 800-160 MG PO TABS: 1.0000 | ORAL_TABLET | Freq: Two times a day (BID) | ORAL | 0 refills | Status: AC

## 2022-05-18 ENCOUNTER — Other Ambulatory Visit: Payer: Self-pay | Admitting: Internal Medicine

## 2022-05-18 DIAGNOSIS — F418 Other specified anxiety disorders: Secondary | ICD-10-CM

## 2022-05-20 ENCOUNTER — Encounter: Payer: Self-pay | Admitting: Internal Medicine

## 2022-05-20 ENCOUNTER — Telehealth: Payer: Self-pay

## 2022-05-20 NOTE — Telephone Encounter (Signed)
Patient is calling in stating she needs a letter stating that she has depression and is currently on medication for it.

## 2022-05-21 ENCOUNTER — Encounter: Payer: Self-pay | Admitting: Internal Medicine

## 2022-05-21 ENCOUNTER — Ambulatory Visit: Payer: Federal, State, Local not specified - PPO | Admitting: Internal Medicine

## 2022-05-28 ENCOUNTER — Other Ambulatory Visit: Payer: Self-pay | Admitting: Internal Medicine

## 2022-05-28 DIAGNOSIS — E669 Obesity, unspecified: Secondary | ICD-10-CM

## 2022-05-28 DIAGNOSIS — I10 Essential (primary) hypertension: Secondary | ICD-10-CM

## 2022-05-28 MED ORDER — PHENTERMINE HCL 37.5 MG PO CAPS: 37.5000 mg | ORAL_CAPSULE | Freq: Every morning | ORAL | 0 refills | Status: DC

## 2022-05-28 MED ORDER — NEBIVOLOL HCL 5 MG PO TABS: 5.0000 mg | ORAL_TABLET | Freq: Every day | ORAL | 0 refills | Status: DC

## 2022-06-12 ENCOUNTER — Ambulatory Visit: Payer: Federal, State, Local not specified - PPO | Admitting: Neurology

## 2022-06-16 ENCOUNTER — Other Ambulatory Visit: Payer: Self-pay | Admitting: Internal Medicine

## 2022-06-16 DIAGNOSIS — I10 Essential (primary) hypertension: Secondary | ICD-10-CM

## 2022-06-17 ENCOUNTER — Ambulatory Visit: Payer: Federal, State, Local not specified - PPO | Admitting: Neurology

## 2022-06-17 ENCOUNTER — Encounter: Payer: Self-pay | Admitting: Neurology

## 2022-06-17 VITALS — BP 155/101 | HR 79 | Ht 69.0 in | Wt 198.0 lb

## 2022-06-17 DIAGNOSIS — R413 Other amnesia: Secondary | ICD-10-CM

## 2022-06-17 DIAGNOSIS — F4329 Adjustment disorder with other symptoms: Secondary | ICD-10-CM | POA: Diagnosis not present

## 2022-06-17 MED ORDER — VITAMIN B-12 1000 MCG PO TABS: 1000.0000 ug | ORAL_TABLET | Freq: Every day | ORAL | 11 refills | Status: DC

## 2022-06-17 NOTE — Progress Notes (Signed)
[facility]  PATIENT: Mary Sanford DOB: 1966/02/16  REQUESTING CLINICIAN: [name], MD HISTORY FROM: Patient  REASON FOR VISIT: Memory changes    HISTORICAL  CHIEF COMPLAINT:  Chief Complaint  Patient presents with   New Patient (Initial Visit)    Rm 12, alone NP internal referral for memory changes States memory decline started 6 months, states she is under a lot stress,     HISTORY OF PRESENT ILLNESS:  This is a 56 year old woman past medical history hypertension, chronic pain who is presenting with memory complaint.  Patient reports that memory concerns have been going on for the past 6 months.  First of all, at her work she was put in a new role and has been learning the new job requirements on top of her previous work duties.  This has been very stressful for her.  She has been forgetful about doing some tasks at work.  She has not been written up by her boss. She has to keep notes. She has told her boss that she cannot do both work assignments and now she is switched to her new role.   She also reports that she has these blackout moment, for instance yesterday she picked up her granddaughter but she could not remember that she did.  She asked her daughter who pick up her granddaughter and her daughter told her it was her, the patient.  She still cannot remember that.  Sometimes she will take her medications and right away she is unsure if she took her medication or not.  She reports that family and husband has also complained about memory.  Again patient reports increased stress at work because she has to learn a new job and also at home because she is having issue with communication with her husband.  She also reports that she is easily distracted, she gets bored easily.   TBI:   No past history of TBI Stroke:   no past history of stroke Seizures:   no past history of seizures Sleep:   no history of sleep apnea.   Mood: Yes, Depression, she is on  Escitalopram.  Family history of Dementia:   Denies  Functional status: independent in all ADLs and IADLs Patient lives with husband and family. Cooking: yes, no issues  Cleaning: yes, no issues Shopping: yes, no issues Bathing: patient, no help needed Toileting: patient, no help needed  Driving: patient, denies being involved in a recent accident Bills: Has been late because she forgot to pay   Ever left the stove on by accident?: denies Forget how to use items around the house?: denies Getting lost going to familiar places?: denies Forgetting loved ones names?: denies Word finding difficulty? Denies  Sleep: Wake up multiple times in the night    OTHER MEDICAL CONDITIONS: Hypertension, chronic pain   REVIEW OF SYSTEMS: Full 14 system review of systems performed and negative with exception of: As noted in the HPI   ALLERGIES: Allergies  Allergen Reactions   Naproxen Other (See Comments)    Panic attacks No Problem with Motrin    HOME MEDICATIONS: Outpatient Medications Prior to Visit  Medication Sig Dispense Refill   acetaminophen (TYLENOL) 500 MG tablet Take 500-1,000 mg by mouth as needed for headache or moderate pain.     albuterol (VENTOLIN HFA) 108 (90 Base) MCG/ACT inhaler Inhale 1-2 puffs into the lungs every 6 (six) hours as needed for wheezing or shortness of breath (for seasonal allergies). (Patient taking  differently: Inhale 1-2 puffs into the lungs as needed for wheezing or shortness of breath (for seasonal allergies).) 18 g 3   APPLE CIDER VINEGAR PO Take 2 tablets by mouth daily.     Cholecalciferol (VITAMIN D3) 25 MCG (1000 UT) CHEW Chew 2 tablets by mouth daily.     escitalopram (LEXAPRO) 20 MG tablet Take 1 tablet by mouth once daily 90 tablet 0   nebivolol (BYSTOLIC) 5 MG tablet Take 1 tablet (5 mg total) by mouth daily. 90 tablet 0   oxyCODONE-acetaminophen (PERCOCET/ROXICET) 5-325 MG tablet Take 1 tablet by mouth every 8 (eight) hours as needed for  severe pain. 60 tablet 0   phentermine 37.5 MG capsule Take 1 capsule (37.5 mg total) by mouth every morning. 30 capsule 0   triamterene-hydrochlorothiazide (MAXZIDE-25) 37.5-25 MG tablet Take 1 tablet by mouth once daily 90 tablet 0   betamethasone valerate ointment (VALISONE) 0.1 % Apply a pea sized amount topically BID for up to 2 weeks as needed 30 g 0   celecoxib (CELEBREX) 50 MG capsule Take 1 capsule (50 mg total) by mouth 2 (two) times daily. 180 capsule 1   scopolamine (TRANSDERM-SCOP) 1 MG/3DAYS Place 1 patch (1.5 mg total) onto the skin every 3 (three) days. 4 patch 0   sucralfate (CARAFATE) 1 GM/10ML suspension Take 10 mLs (1 g total) by mouth every 6 (six) hours as needed. 420 mL 1   tamsulosin (FLOMAX) 0.4 MG CAPS capsule Take 1 capsule (0.4 mg total) by mouth at bedtime. Take daily until stone expulsion 30 capsule 0   No facility-administered medications prior to visit.    PAST MEDICAL HISTORY: Past Medical History:  Diagnosis Date   Allergy    seasonal allergies   Anemia    Anxiety    Arthritis    hands, left knee   COVID    Depression    Family history of adverse reaction to anesthesia    one sister ponv and slow to awaken   History of iron deficiency anemia    History of kidney stones    Hypertension    followed by pcp  (12-05-2019 pt had ETT 12-11-2011 , in epic, showed normal with no ischemia)   Nephrolithiasis    Panic disorder    PMB (postmenopausal bleeding)    Pre-diabetes    followed by pcp   Seasonal allergies    SUI (stress urinary incontinence, female)    Uterine fibroid    Wears glasses     PAST SURGICAL HISTORY: Past Surgical History:  Procedure Laterality Date   BUNIONECTOMY Bilateral    CESAREAN SECTION  1989   CYSTOSCOPY W/ RETROGRADES Left 06/28/2018   Procedure: CYSTOSCOPY WITH RETROGRADE PYELOGRAM LEFT STENT;  Surgeon: Ceasar Mons, MD;  Location: [facility];  Service: Urology;  Laterality: Left;    CYSTOSCOPY/URETEROSCOPY/HOLMIUM LASER/STENT PLACEMENT Left 07/16/2018   Procedure: CYSTOSCOPY/URETEROSCOPY/HOLMIUM LASER/STENT EXCHANGE;  Surgeon: Ceasar Mons, MD;  Location: [facility];  Service: Urology;  Laterality: Left;   DILATATION & CURETTAGE/HYSTEROSCOPY WITH MYOSURE N/A 12/13/2019   Procedure: DILATATION & CURETTAGE/HYSTEROSCOPY WITH MYOSURE;  Surgeon: Salvadore Dom, MD;  Location: [place] [facility];  Service: Gynecology;  Laterality: N/A;   DILITATION & CURRETTAGE/HYSTROSCOPY WITH NOVASURE ABLATION N/A 06/17/2013   Procedure: DILATATION & CURETTAGE/HYSTEROSCOPY WITH NOVASURE ABLATION;  Surgeon: Osborne Oman, MD;  Location: [facility];  Service: Gynecology;  Laterality: N/A;   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   Procedure: LAPAROSCOPIC ROUX-EN-Y GASTRIC BYPASS WITH UPPER ENDOSCOPY;  Surgeon: Greer Pickerel, MD;  Location: [facility];  Service: General;  Laterality: N/A;   HEMORRHOIDECTOMY WITH HEMORRHOID BANDING  10/19/2012   [facility] [place] Hem ligation/pexy   OPERATIVE ULTRASOUND N/A 12/13/2019   Procedure: OPERATIVE ULTRASOUND;  Surgeon: Salvadore Dom, MD;  Location: [facility];  Service: Gynecology;  Laterality: N/A;   Thumb surgery   04/2021   TUBAL LIGATION Bilateral 2001   UPPER GASTROINTESTINAL ENDOSCOPY  10/11/2020   WISDOM TOOTH EXTRACTION      FAMILY HISTORY: Family History  Problem Relation Age of Onset   Hypertension Mother    Hypothyroidism Mother    Rectal cancer Mother 67   Colon cancer Mother 70   Colon polyps Mother    Hypotension Sister    Hypothyroidism Sister    Diabetes Sister    Other Sister        pacemaker   Seizures Brother    Hypothyroidism Sister    Seizures Sister    Anemia Sister    Deep vein thrombosis Sister    Seizures Paternal Grandfather    Stroke Maternal Uncle        > 35   Diabetes Maternal Grandmother    Kidney failure Maternal Grandmother    Heart attack Maternal Grandfather     Other Paternal Grandmother        tumors throughout body   Hypertension Maternal Uncle    Esophageal cancer Neg Hx    Stomach cancer Neg Hx    Heart disease Neg Hx     SOCIAL HISTORY: Social History   Socioeconomic History   Marital status: Married    Spouse name: Not on file   Number of children: Not on file   Years of education: Not on file   Highest education level: Not on file  Occupational History   Occupation: minister  Tobacco Use   Smoking status: Light Smoker    Years: 5.00    Types: Cigarettes    Last attempt to quit: 08/05/1999    Years since quitting: 22.8   Smokeless tobacco: Never   Tobacco comments:    Occasionally  Vaping Use   Vaping Use: Never used  Substance and Sexual Activity   Alcohol use: Yes    Alcohol/week: 10.0 standard drinks of alcohol    Types: 10 Glasses of wine per week   Drug use: No   Sexual activity: Yes    Partners: Male    Birth control/protection: Surgical    Comment: BTL, Ablation  Other Topics Concern   Not on file  Social History Narrative   Not on file   Social Determinants of Health   Financial Resource Strain: Not on file  Food Insecurity: Not on file  Transportation Needs: Not on file  Physical Activity: Not on file  Stress: Not on file  Social Connections: Not on file  Intimate Partner Violence: Not on file     PHYSICAL EXAM  GENERAL EXAM/CONSTITUTIONAL: Vitals:  Vitals:   06/17/22 0848  BP: (!) 155/101  Pulse: 79  Weight: 198 lb (89.8 kg)  Height: '5\' 9"'$  (1.753 m)   Body mass index is 29.24 kg/m. Wt Readings from Last 3 Encounters:  06/17/22 198 lb (89.8 kg)  05/05/22 196 lb (88.9 kg)  03/14/22 204 lb (92.5 kg)   Patient is in no distress; well developed, nourished and groomed; neck is supple   EYES: Pupils round and reactive to light, Visual fields full to confrontation, Extraocular movements intacts,   MUSCULOSKELETAL: Gait,  strength, tone, movements noted in Neurologic exam  below  NEUROLOGIC: MENTAL STATUS:      No data to display            06/17/2022    8:51 AM  Montreal Cognitive Assessment   Visuospatial/ Executive (0/5) 4  Naming (0/3) 3  Attention: Read list of digits (0/2) 2  Attention: Read list of letters (0/1) 1  Attention: Serial 7 subtraction starting at 100 (0/3) 1  Language: Repeat phrase (0/2) 1  Language : Fluency (0/1) 1  Abstraction (0/2) 2  Delayed Recall (0/5) 1  Orientation (0/6) 6  Total 22     CRANIAL NERVE:  2nd, 3rd, 4th, 6th - pupils equal and reactive to light, visual fields full to confrontation, extraocular muscles intact, no nystagmus 5th - facial sensation symmetric 7th - facial strength symmetric 8th - hearing intact 9th - palate elevates symmetrically, uvula midline 11th - shoulder shrug symmetric 12th - tongue protrusion midline  MOTOR:  normal bulk and tone, full strength in the BUE, BLE  SENSORY:  normal and symmetric to light touch, vibration  COORDINATION:  finger-nose-finger, fine finger movements normal  GAIT/STATION:  normal    DIAGNOSTIC DATA (LABS, IMAGING, TESTING) - I reviewed patient records, labs, notes, testing and imaging myself where available.  Lab Results  Component Value Date   WBC 8.2 03/14/2022   HGB 13.7 03/14/2022   HCT 43.4 03/14/2022   MCV 74.1 (L) 03/14/2022   PLT 239 03/14/2022      Component Value Date/Time   NA 140 03/14/2022 1340   K 3.7 03/14/2022 1340   CL 101 03/14/2022 1340   CO2 27 03/14/2022 1340   GLUCOSE 103 (H) 03/14/2022 1340   BUN 12 03/14/2022 1340   CREATININE 0.82 03/14/2022 1340   CALCIUM 10.0 03/14/2022 1340   PROT 7.7 03/14/2022 1340   ALBUMIN 4.4 03/14/2022 1340   AST 27 03/14/2022 1340   ALT 30 03/14/2022 1340   ALKPHOS 72 03/14/2022 1340   BILITOT 0.8 03/14/2022 1340   GFRNONAA >60 03/14/2022 1340   GFRAA >60 12/13/2019 0708   Lab Results  Component Value Date   CHOL 142 12/26/2021   HDL 61.60 12/26/2021   LDLCALC 56  12/26/2021   TRIG 122.0 12/26/2021   CHOLHDL 2 12/26/2021   Lab Results  Component Value Date   HGBA1C 6.2 12/26/2021   Lab Results  Component Value Date   VITAMINB12 376 12/26/2021   Lab Results  Component Value Date   TSH 0.64 01/04/2021   MRI Brain 10/24/21 Negative brain MRI.    ASSESSMENT AND PLAN  56 y.o. year old female with history hypertension, chronic pain who is presenting with memory complaint.  She reports being forgetful but also reports increased stress at work and at home.  She is independent in all ADLs and IADLs.  She scored 22 out of 30 on the [place] but lost most of points on delayed recall showing a deficit in attention.  I have informed patient that I do not believe that she has dementia, her memory complaints are likely related to stress.  Advised her to seek counseling for herself, and also counseling with her husband to improve communication skills.  Advised her to increase her exercise, eat well and obtain good sleep.  Her B12 was at 376, I will prescribe her B12 supplement.  She is comfortable with plan. Continue to follow up with PCP and return as needed.    1. Memory changes  2. Stress and adjustment reaction      Patient Instructions  Continue current medications B12 supplement 1000 mcg daily Continue follow-up with your PCP Return as needed.    There are well-accepted and sensible ways to reduce risk for Alzheimers disease and other degenerative brain disorders .  Exercise Daily Walk A daily 20 minute walk should be part of your routine. Disease related apathy can be a significant roadblock to exercise and the only way to overcome this is to make it a daily routine and perhaps have a reward at the end (something your loved one loves to eat or drink perhaps) or a personal trainer coming to the home can also be very useful. Most importantly, the patient is much more likely to exercise if the caregiver / spouse does it with him/her. In general a  structured, repetitive schedule is best.  General Health: Any diseases which effect your body will effect your brain such as a pneumonia, urinary infection, blood clot, heart attack or stroke. Keep contact with your primary care doctor for regular follow ups.  Sleep. A good nights sleep is healthy for the brain. Seven hours is recommended. If you have insomnia or poor sleep habits we can give you some instructions. If you have sleep apnea wear your mask.  Diet: Eating a heart healthy diet is also a good idea; fish and poultry instead of red meat, nuts (mostly non-peanuts), vegetables, fruits, olive oil or canola oil (instead of butter), minimal salt (use other spices to flavor foods), whole grain rice, bread, cereal and pasta and wine in moderation.Research is now showing that the MIND diet, which is a combination of The Mediterranean diet and the DASH diet, is beneficial for cognitive processing and longevity. Information about this diet can be found in The MIND Diet, a book by Doyne Keel, MS, RDN, and online at [employer]bookDistributors.si  Finances, Power of Attorney and Advance Directives: You should consider putting legal safeguards in place with regard to financial and medical decision making. While the spouse always has power of attorney for medical and financial issues in the absence of any form, you should consider what you want in case the spouse / caregiver is no longer around or capable of making decisions.   No orders of the defined types were placed in this encounter.   Meds ordered this encounter  Medications   cyanocobalamin (VITAMIN B12) 1000 MCG tablet    Sig: Take 1 tablet (1,000 mcg total) by mouth daily.    Dispense:  30 tablet    Refill:  11    Return if symptoms worsen or fail to improve.  I have spent a total of 60 minutes dedicated to this patient today, preparing to see patient, performing a medically appropriate examination and evaluation, ordering  tests and/or medications and procedures, and counseling and educating the patient/family/caregiver; independently interpreting result and communicating results to the family/patient/caregiver; and documenting clinical information in the electronic medical record.   Alric Ran, MD 06/17/2022, 10:02 AM  [facility] [address] [place], [place] [address] [phone]

## 2022-06-17 NOTE — Patient Instructions (Signed)
Continue current medications B12 supplement 1000 mcg daily Continue follow-up with your PCP Return as needed.    There are well-accepted and sensible ways to reduce risk for Alzheimers disease and other degenerative brain disorders .  Exercise Daily Walk A daily 20 minute walk should be part of your routine. Disease related apathy can be a significant roadblock to exercise and the only way to overcome this is to make it a daily routine and perhaps have a reward at the end (something your loved one loves to eat or drink perhaps) or a personal trainer coming to the home can also be very useful. Most importantly, the patient is much more likely to exercise if the caregiver / spouse does it with him/her. In general a structured, repetitive schedule is best.  General Health: Any diseases which effect your body will effect your brain such as a pneumonia, urinary infection, blood clot, heart attack or stroke. Keep contact with your primary care doctor for regular follow ups.  Sleep. A good nights sleep is healthy for the brain. Seven hours is recommended. If you have insomnia or poor sleep habits we can give you some instructions. If you have sleep apnea wear your mask.  Diet: Eating a heart healthy diet is also a good idea; fish and poultry instead of red meat, nuts (mostly non-peanuts), vegetables, fruits, olive oil or canola oil (instead of butter), minimal salt (use other spices to flavor foods), whole grain rice, bread, cereal and pasta and wine in moderation.Research is now showing that the MIND diet, which is a combination of The Mediterranean diet and the DASH diet, is beneficial for cognitive processing and longevity. Information about this diet can be found in The MIND Diet, a book by Doyne Keel, MS, RDN, and online at [employer]book[employer].si  Finances, Power of Attorney and Advance Directives: You should consider putting legal safeguards in place with regard to financial  and medical decision making. While the spouse always has power of attorney for medical and financial issues in the absence of any form, you should consider what you want in case the spouse / caregiver is no longer around or capable of making decisions.

## 2022-06-19 ENCOUNTER — Ambulatory Visit: Payer: Federal, State, Local not specified - PPO | Admitting: Neurology

## 2022-06-23 ENCOUNTER — Ambulatory Visit (INDEPENDENT_AMBULATORY_CARE_PROVIDER_SITE_OTHER): Payer: Federal, State, Local not specified - PPO

## 2022-06-23 ENCOUNTER — Ambulatory Visit: Payer: Federal, State, Local not specified - PPO | Admitting: Emergency Medicine

## 2022-06-23 ENCOUNTER — Encounter: Payer: Self-pay | Admitting: Emergency Medicine

## 2022-06-23 VITALS — BP 140/90 | HR 78 | Temp 98.6°F | Ht 69.0 in | Wt 199.0 lb

## 2022-06-23 DIAGNOSIS — M545 Low back pain, unspecified: Secondary | ICD-10-CM

## 2022-06-23 DIAGNOSIS — M549 Dorsalgia, unspecified: Secondary | ICD-10-CM | POA: Diagnosis not present

## 2022-06-23 DIAGNOSIS — S39012A Strain of muscle, fascia and tendon of lower back, initial encounter: Secondary | ICD-10-CM | POA: Diagnosis not present

## 2022-06-23 DIAGNOSIS — G8929 Other chronic pain: Secondary | ICD-10-CM

## 2022-06-23 DIAGNOSIS — M62838 Other muscle spasm: Secondary | ICD-10-CM | POA: Diagnosis not present

## 2022-06-23 MED ORDER — OXYCODONE-ACETAMINOPHEN 5-325 MG PO TABS: 1.0000 | ORAL_TABLET | Freq: Three times a day (TID) | ORAL | 0 refills | Status: DC | PRN

## 2022-06-23 MED ORDER — CYCLOBENZAPRINE HCL 10 MG PO TABS: 10.0000 mg | ORAL_TABLET | Freq: Three times a day (TID) | ORAL | 0 refills | Status: DC | PRN

## 2022-06-23 NOTE — Assessment & Plan Note (Addendum)
Creating lumbar pain with muscle spasm and affecting quality of life.  Unknown trigger.  No red flag signs or symptoms. X-ray done today. Recommend orthopedic evaluation Referral placed today.

## 2022-06-23 NOTE — Assessment & Plan Note (Signed)
Heat pad every 20 minutes for the next 2 to 3 days Start Flexeril 10 mg 3 times daily as needed.

## 2022-06-23 NOTE — Patient Instructions (Signed)
Acute Back Pain, Adult Acute back pain is sudden and usually short-lived. It is often caused by an injury to the muscles and tissues in the back. The injury may result from: A muscle, tendon, or ligament getting overstretched or torn. Ligaments are tissues that connect bones to each other. Lifting something improperly can cause a back strain. Wear and tear (degeneration) of the spinal disks. Spinal disks are circular tissue that provide cushioning between the bones of the spine (vertebrae). Twisting motions, such as while playing sports or doing yard work. A hit to the back. Arthritis. You may have a physical exam, lab tests, and imaging tests to find the cause of your pain. Acute back pain usually goes away with rest and home care. Follow these instructions at home: Managing pain, stiffness, and swelling Take over-the-counter and prescription medicines only as told by your health care provider. Treatment may include medicines for pain and inflammation that are taken by mouth or applied to the skin, or muscle relaxants. Your health care provider may recommend applying ice during the first 24-48 hours after your pain starts. To do this: Put ice in a plastic bag. Place a towel between your skin and the bag. Leave the ice on for 20 minutes, 2-3 times a day. Remove the ice if your skin turns bright red. This is very important. If you cannot feel pain, heat, or cold, you have a greater risk of damage to the area. If directed, apply heat to the affected area as often as told by your health care provider. Use the heat source that your health care provider recommends, such as a moist heat pack or a heating pad. Place a towel between your skin and the heat source. Leave the heat on for 20-30 minutes. Remove the heat if your skin turns bright red. This is especially important if you are unable to feel pain, heat, or cold. You have a greater risk of getting burned. Activity  Do not stay in bed. Staying in  bed for more than 1-2 days can delay your recovery. Sit up and stand up straight. Avoid leaning forward when you sit or hunching over when you stand. If you work at a desk, sit close to it so you do not need to lean over. Keep your chin tucked in. Keep your neck drawn back, and keep your elbows bent at a 90-degree angle (right angle). Sit high and close to the steering wheel when you drive. Add lower back (lumbar) support to your car seat, if needed. Take short walks on even surfaces as soon as you are able. Try to increase the length of time you walk each day. Do not sit, drive, or stand in one place for more than 30 minutes at a time. Sitting or standing for long periods of time can put stress on your back. Do not drive or use heavy machinery while taking prescription pain medicine. Use proper lifting techniques. When you bend and lift, use positions that put less stress on your back: Bend your knees. Keep the load close to your body. Avoid twisting. Exercise regularly as told by your health care provider. Exercising helps your back heal faster and helps prevent back injuries by keeping muscles strong and flexible. Work with a physical therapist to make a safe exercise program, as recommended by your health care provider. Do any exercises as told by your physical therapist. Lifestyle Maintain a healthy weight. Extra weight puts stress on your back and makes it difficult to have good   posture. Avoid activities or situations that make you feel anxious or stressed. Stress and anxiety increase muscle tension and can make back pain worse. Learn ways to manage anxiety and stress, such as through exercise. General instructions Sleep on a firm mattress in a comfortable position. Try lying on your side with your knees slightly bent. If you lie on your back, put a pillow under your knees. Keep your head and neck in a straight line with your spine (neutral position) when using electronic equipment like  smartphones or pads. To do this: Raise your smartphone or pad to look at it instead of bending your head or neck to look down. Put the smartphone or pad at the level of your face while looking at the screen. Follow your treatment plan as told by your health care provider. This may include: Cognitive or behavioral therapy. Acupuncture or massage therapy. Meditation or yoga. Contact a health care provider if: You have pain that is not relieved with rest or medicine. You have increasing pain going down into your legs or buttocks. Your pain does not improve after 2 weeks. You have pain at night. You lose weight without trying. You have a fever or chills. You develop nausea or vomiting. You develop abdominal pain. Get help right away if: You develop new bowel or bladder control problems. You have unusual weakness or numbness in your arms or legs. You feel faint. These symptoms may represent a serious problem that is an emergency. Do not wait to see if the symptoms will go away. Get medical help right away. Call your local emergency services (911 in the [place] Do not drive yourself to the hospital. Summary Acute back pain is sudden and usually short-lived. Use proper lifting techniques. When you bend and lift, use positions that put less stress on your back. Take over-the-counter and prescription medicines only as told by your health care provider, and apply heat or ice as told. This information is not intended to replace advice given to you by your health care provider. Make sure you discuss any questions you have with your health care provider. Document Revised: 10/12/2020 Document Reviewed: 10/12/2020 Elsevier Patient Education  2023 [employer]  

## 2022-06-23 NOTE — Assessment & Plan Note (Signed)
Secondary to lumbar strain. Pain management discussed. Tylenol for mild to moderate pain. Percocet for moderate to severe pain. Unable to take NSAIDs

## 2022-06-23 NOTE — Progress Notes (Signed)
Mary Sanford 56 y.o.   Chief complaint: Low back pain  HISTORY OF PRESENT ILLNESS: Acute problem visit today.  Patient of Dr. Scarlette Calico This is a 56 y.o. female complaining of low back pain that started couple days ago.  Unknown trigger. Localized sharp pain mostly to right lumbar area.  No radiation.  No numbness or tingling to the legs.  Able to walk okay. Denies bowel or bladder symptoms.  HPI   Prior to Admission medications   Medication Sig Start Date End Date Taking? Authorizing Provider  acetaminophen (TYLENOL) 500 MG tablet Take 500-1,000 mg by mouth as needed for headache or moderate pain.   Yes [provider]  albuterol (VENTOLIN HFA) 108 (90 Base) MCG/ACT inhaler Inhale 1-2 puffs into the lungs every 6 (six) hours as needed for wheezing or shortness of breath (for seasonal allergies). Patient taking differently: Inhale 1-2 puffs into the lungs as needed for wheezing or shortness of breath (for seasonal allergies). 06/15/20  Yes [name], MD  APPLE CIDER VINEGAR PO Take 2 tablets by mouth daily.   Yes [provider]  Cholecalciferol (VITAMIN D3) 25 MCG (1000 UT) CHEW Chew 2 tablets by mouth daily.   Yes [provider]  cyanocobalamin (VITAMIN B12) 1000 MCG tablet Take 1 tablet (1,000 mcg total) by mouth daily. 06/17/22 06/12/23 Yes Alric Ran, MD  escitalopram (LEXAPRO) 20 MG tablet Take 1 tablet by mouth once daily 05/18/22  Yes [name], MD  nebivolol (BYSTOLIC) 5 MG tablet Take 1 tablet (5 mg total) by mouth daily. 05/28/22  Yes [name], MD  oxyCODONE-acetaminophen (PERCOCET/ROXICET) 5-325 MG tablet Take 1 tablet by mouth every 8 (eight) hours as needed for severe pain. 05/06/22  Yes [name], MD  phentermine 37.5 MG capsule Take 1 capsule (37.5 mg total) by mouth every morning. 05/28/22  Yes [name], MD  triamterene-hydrochlorothiazide [facility]) 37.5-25 MG tablet Take 1 tablet by mouth once daily  06/16/22  Yes [name], MD  potassium chloride (K-DUR) 10 MEQ tablet Take 1 tablet (10 mEq total) by mouth 2 (two) times daily. 05/13/12 06/27/13  [name], MD    Allergies  Allergen Reactions   Naproxen Other (See Comments)    Panic attacks No Problem with Motrin    Patient Active Problem List   Diagnosis Date Noted   Memory changes 05/05/2022   Primary osteoarthritis involving multiple joints 12/26/2021   Chronic bilateral low back pain without sciatica 12/26/2021   Pre-diabetes    Mild persistent asthma without complication 16/05/9603   Overweight (BMI 25.0-29.9) 06/14/2020   Status post bariatric surgery 06/16/2019   Vitamin D insufficiency 06/16/2019   Depression with anxiety 12/28/2015   Allergic rhinitis 01/12/2013   B12 deficiency anemia 01/09/2012   Obesity (BMI 30-39.9) 01/09/2012   Essential hypertension, benign 11/24/2011    Past Medical History:  Diagnosis Date   Allergy    seasonal allergies   Anemia    Anxiety    Arthritis    hands, left knee   COVID    Depression    Family history of adverse reaction to anesthesia    one sister ponv and slow to awaken   History of iron deficiency anemia    History of kidney stones    Hypertension    followed by pcp  (12-05-2019 pt had ETT 12-11-2011 , in epic, showed normal with no ischemia)   Nephrolithiasis    Panic disorder  PMB (postmenopausal bleeding)    Pre-diabetes    followed by pcp   Seasonal allergies    SUI (stress urinary incontinence, female)    Uterine fibroid    Wears glasses     Past Surgical History:  Procedure Laterality Date   BUNIONECTOMY Bilateral    CESAREAN SECTION  1989   CYSTOSCOPY W/ RETROGRADES Left 06/28/2018   Procedure: CYSTOSCOPY WITH RETROGRADE PYELOGRAM LEFT STENT;  Surgeon: Ceasar Mons, MD;  Location: [facility];  Service: Urology;  Laterality: Left;   CYSTOSCOPY/URETEROSCOPY/HOLMIUM LASER/STENT PLACEMENT Left 07/16/2018   Procedure:  CYSTOSCOPY/URETEROSCOPY/HOLMIUM LASER/STENT EXCHANGE;  Surgeon: Ceasar Mons, MD;  Location: [facility];  Service: Urology;  Laterality: Left;   DILATATION & CURETTAGE/HYSTEROSCOPY WITH MYOSURE N/A 12/13/2019   Procedure: DILATATION & CURETTAGE/HYSTEROSCOPY WITH MYOSURE;  Surgeon: Salvadore Dom, MD;  Location: [facility];  Service: Gynecology;  Laterality: N/A;   DILITATION & CURRETTAGE/HYSTROSCOPY WITH NOVASURE ABLATION N/A 06/17/2013   Procedure: DILATATION & CURETTAGE/HYSTEROSCOPY WITH NOVASURE ABLATION;  Surgeon: Osborne Oman, MD;  Location: [facility];  Service: Gynecology;  Laterality: N/A;   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   Procedure: LAPAROSCOPIC ROUX-EN-Y GASTRIC BYPASS WITH UPPER ENDOSCOPY;  Surgeon: Greer Pickerel, MD;  Location: [facility];  Service: General;  Laterality: N/A;   HEMORRHOIDECTOMY WITH HEMORRHOID BANDING  10/19/2012   [facility] [place] Hem ligation/pexy   OPERATIVE ULTRASOUND N/A 12/13/2019   Procedure: OPERATIVE ULTRASOUND;  Surgeon: Salvadore Dom, MD;  Location: [facility];  Service: Gynecology;  Laterality: N/A;   Thumb surgery   04/2021   TUBAL LIGATION Bilateral 2001   UPPER GASTROINTESTINAL ENDOSCOPY  10/11/2020   WISDOM TOOTH EXTRACTION      Social History   Socioeconomic History   Marital status: Married    Spouse name: Not on file   Number of children: Not on file   Years of education: Not on file   Highest education level: Not on file  Occupational History   Occupation: minister  Tobacco Use   Smoking status: Light Smoker    Years: 5.00    Types: Cigarettes    Last attempt to quit: 08/05/1999    Years since quitting: 22.8   Smokeless tobacco: Never   Tobacco comments:    Occasionally  Vaping Use   Vaping Use: Never used  Substance and Sexual Activity   Alcohol use: Yes    Alcohol/week: 10.0 standard drinks of alcohol    Types: 10 Glasses of wine per week   Drug use: No   Sexual  activity: Yes    Partners: Male    Birth control/protection: Surgical    Comment: BTL, Ablation  Other Topics Concern   Not on file  Social History Narrative   Not on file   Social Determinants of Health   Financial Resource Strain: Not on file  Food Insecurity: Not on file  Transportation Needs: Not on file  Physical Activity: Not on file  Stress: Not on file  Social Connections: Not on file  Intimate Partner Violence: Not on file    Family History  Problem Relation Age of Onset   Hypertension Mother    Hypothyroidism Mother    Rectal cancer Mother 82   Colon cancer Mother 46   Colon polyps Mother    Hypotension Sister    Hypothyroidism Sister    Diabetes Sister    Other Sister        pacemaker   Seizures Brother    Hypothyroidism  Sister    Seizures Sister    Anemia Sister    Deep vein thrombosis Sister    Seizures Paternal Grandfather    Stroke Maternal Uncle        > 38   Diabetes Maternal Grandmother    Kidney failure Maternal Grandmother    Heart attack Maternal Grandfather    Other Paternal Grandmother        tumors throughout body   Hypertension Maternal Uncle    Esophageal cancer Neg Hx    Stomach cancer Neg Hx    Heart disease Neg Hx      Review of Systems  Constitutional: Negative.  Negative for chills and fever.  HENT: Negative.  Negative for congestion and sore throat.   Respiratory: Negative.  Negative for cough and shortness of breath.   Cardiovascular: Negative.   Gastrointestinal: Negative.  Negative for abdominal pain, diarrhea, nausea and vomiting.  Genitourinary: Negative.  Negative for dysuria and hematuria.  Musculoskeletal:  Positive for back pain.  Skin: Negative.  Negative for rash.  Neurological: Negative.  Negative for dizziness and headaches.  All other systems reviewed and are negative.  Today's Vitals   06/23/22 0802 06/23/22 0810  BP: (!) 140/92 (!) 140/90  Pulse: 78   Temp: 98.6 F (37 C)   TempSrc: Oral   SpO2: 98%    Weight: 199 lb (90.3 kg)   Height: '5\' 9"'$  (1.696 m)    Body mass index is 29.39 kg/m.   Physical Exam Vitals reviewed.  Constitutional:      Appearance: Normal appearance.  HENT:     Head: Normocephalic.  Eyes:     Extraocular Movements: Extraocular movements intact.  Cardiovascular:     Rate and Rhythm: Normal rate.  Pulmonary:     Effort: Pulmonary effort is normal.  Abdominal:     Palpations: Abdomen is soft.     Tenderness: There is no abdominal tenderness.  Musculoskeletal:        General: No swelling.     Lumbar back: Spasms and tenderness present. No bony tenderness. Decreased range of motion. Negative right straight leg raise test and negative left straight leg raise test.       Back:     Right lower leg: No edema.     Left lower leg: No edema.  Skin:    General: Skin is warm and dry.  Neurological:     General: No focal deficit present.     Mental Status: She is alert and oriented to person, place, and time.     Sensory: No sensory deficit.     Motor: No weakness.     Coordination: Coordination normal.     Gait: Gait normal.     Deep Tendon Reflexes: Reflexes normal.  Psychiatric:        Mood and Affect: Mood normal.        Behavior: Behavior normal.    DG Lumbar Spine 2-3 Views  Result Date: 06/23/2022 CLINICAL DATA:  Lumbar pain.  No known injury. EXAM: LUMBAR SPINE - 2-3 VIEW COMPARISON:  CT AP 03/14/2022 FINDINGS: There is no evidence of lumbar spine fracture. Alignment is normal. Intervertebral disc spaces are maintained. IMPRESSION: Negative. Electronically Signed   By: Kerby Moors M.D.   On: 06/23/2022 08:39     ASSESSMENT & PLAN: A total of 33 minutes was spent with the patient and counseling/coordination of care regarding preparing for this visit, review of available medical records, review of multiple chronic medical problems and  their management, review of all medications, diagnosis of acute lumbar strain and treatment approach, pain  management, review of today's x-ray report, prognosis, documentation, and need for follow-up with orthopedist and PCP.  Problem List Items Addressed This Visit       Musculoskeletal and Integument   Acute lumbar myofascial strain    Creating lumbar pain with muscle spasm and affecting quality of life.  Unknown trigger.  No red flag signs or symptoms. X-ray done today. Recommend orthopedic evaluation Referral placed today.      Relevant Orders   DG Lumbar Spine 2-3 Views   Ambulatory referral to Sports Medicine     Other   Musculoskeletal back pain - Primary    Secondary to lumbar strain. Pain management discussed. Tylenol for mild to moderate pain. Percocet for moderate to severe pain. Unable to take NSAIDs      Relevant Medications   cyclobenzaprine (FLEXERIL) 10 MG tablet   oxyCODONE-acetaminophen (PERCOCET/ROXICET) 5-325 MG tablet   Chronic bilateral low back pain without sciatica   Relevant Medications   cyclobenzaprine (FLEXERIL) 10 MG tablet   oxyCODONE-acetaminophen (PERCOCET/ROXICET) 5-325 MG tablet   Other Relevant Orders   Ambulatory referral to Sports Medicine   Muscle spasm    Heat pad every 20 minutes for the next 2 to 3 days Start Flexeril 10 mg 3 times daily as needed.      Relevant Medications   cyclobenzaprine (FLEXERIL) 10 MG tablet   Patient Instructions  Acute Back Pain, Adult Acute back pain is sudden and usually short-lived. It is often caused by an injury to the muscles and tissues in the back. The injury may result from: A muscle, tendon, or ligament getting overstretched or torn. Ligaments are tissues that connect bones to each other. Lifting something improperly can cause a back strain. Wear and tear (degeneration) of the spinal disks. Spinal disks are circular tissue that provide cushioning between the bones of the spine (vertebrae). Twisting motions, such as while playing sports or doing yard work. A hit to the back. Arthritis. You may  have a physical exam, lab tests, and imaging tests to find the cause of your pain. Acute back pain usually goes away with rest and home care. Follow these instructions at home: Managing pain, stiffness, and swelling Take over-the-counter and prescription medicines only as told by your health care provider. Treatment may include medicines for pain and inflammation that are taken by mouth or applied to the skin, or muscle relaxants. Your health care provider may recommend applying ice during the first 24-48 hours after your pain starts. To do this: Put ice in a plastic bag. Place a towel between your skin and the bag. Leave the ice on for 20 minutes, 2-3 times a day. Remove the ice if your skin turns bright red. This is very important. If you cannot feel pain, heat, or cold, you have a greater risk of damage to the area. If directed, apply heat to the affected area as often as told by your health care provider. Use the heat source that your health care provider recommends, such as a moist heat pack or a heating pad. Place a towel between your skin and the heat source. Leave the heat on for 20-30 minutes. Remove the heat if your skin turns bright red. This is especially important if you are unable to feel pain, heat, or cold. You have a greater risk of getting burned. Activity  Do not stay in bed. Staying in bed for more than  1-2 days can delay your recovery. Sit up and stand up straight. Avoid leaning forward when you sit or hunching over when you stand. If you work at a desk, sit close to it so you do not need to lean over. Keep your chin tucked in. Keep your neck drawn back, and keep your elbows bent at a 90-degree angle (right angle). Sit high and close to the steering wheel when you drive. Add lower back (lumbar) support to your car seat, if needed. Take short walks on even surfaces as soon as you are able. Try to increase the length of time you walk each day. Do not sit, drive, or stand in one  place for more than 30 minutes at a time. Sitting or standing for long periods of time can put stress on your back. Do not drive or use heavy machinery while taking prescription pain medicine. Use proper lifting techniques. When you bend and lift, use positions that put less stress on your back: [place] your knees. Keep the load close to your body. Avoid twisting. Exercise regularly as told by your health care provider. Exercising helps your back heal faster and helps prevent back injuries by keeping muscles strong and flexible. Work with a physical therapist to make a safe exercise program, as recommended by your health care provider. Do any exercises as told by your physical therapist. Lifestyle Maintain a healthy weight. Extra weight puts stress on your back and makes it difficult to have good posture. Avoid activities or situations that make you feel anxious or stressed. Stress and anxiety increase muscle tension and can make back pain worse. Learn ways to manage anxiety and stress, such as through exercise. General instructions Sleep on a firm mattress in a comfortable position. Try lying on your side with your knees slightly bent. If you lie on your back, put a pillow under your knees. Keep your head and neck in a straight line with your spine (neutral position) when using electronic equipment like smartphones or pads. To do this: Raise your smartphone or pad to look at it instead of bending your head or neck to look down. Put the smartphone or pad at the level of your face while looking at the screen. Follow your treatment plan as told by your health care provider. This may include: Cognitive or behavioral therapy. Acupuncture or massage therapy. Meditation or yoga. Contact a health care provider if: You have pain that is not relieved with rest or medicine. You have increasing pain going down into your legs or buttocks. Your pain does not improve after 2 weeks. You have pain at  night. You lose weight without trying. You have a fever or chills. You develop nausea or vomiting. You develop abdominal pain. Get help right away if: You develop new bowel or bladder control problems. You have unusual weakness or numbness in your arms or legs. You feel faint. These symptoms may represent a serious problem that is an emergency. Do not wait to see if the symptoms will go away. Get medical help right away. Call your local emergency services (911 in the [place] Do not drive yourself to the hospital. Summary Acute back pain is sudden and usually short-lived. Use proper lifting techniques. When you bend and lift, use positions that put less stress on your back. Take over-the-counter and prescription medicines only as told by your health care provider, and apply heat or ice as told. This information is not intended to replace advice given to you by your health  care provider. Make sure you discuss any questions you have with your health care provider. Document Revised: 10/12/2020 Document Reviewed: 10/12/2020 Elsevier Patient Education  [place], MD [facility]

## 2022-06-27 ENCOUNTER — Inpatient Hospital Stay (HOSPITAL_COMMUNITY)
Admission: EM | Admit: 2022-06-27 | Discharge: 2022-07-01 | DRG: 872 | Disposition: A | Discharge status: Home / Self Care | Payer: Federal, State, Local not specified - PPO | Attending: Internal Medicine | Admitting: Internal Medicine

## 2022-06-27 ENCOUNTER — Emergency Department (HOSPITAL_COMMUNITY): Payer: Federal, State, Local not specified - PPO

## 2022-06-27 ENCOUNTER — Other Ambulatory Visit: Payer: Self-pay

## 2022-06-27 DIAGNOSIS — F419 Anxiety disorder, unspecified: Secondary | ICD-10-CM | POA: Diagnosis present

## 2022-06-27 DIAGNOSIS — K6389 Other specified diseases of intestine: Secondary | ICD-10-CM | POA: Diagnosis not present

## 2022-06-27 DIAGNOSIS — I10 Essential (primary) hypertension: Secondary | ICD-10-CM | POA: Diagnosis present

## 2022-06-27 DIAGNOSIS — R079 Chest pain, unspecified: Secondary | ICD-10-CM | POA: Diagnosis not present

## 2022-06-27 DIAGNOSIS — F17210 Nicotine dependence, cigarettes, uncomplicated: Secondary | ICD-10-CM | POA: Diagnosis present

## 2022-06-27 DIAGNOSIS — M5126 Other intervertebral disc displacement, lumbar region: Secondary | ICD-10-CM | POA: Diagnosis not present

## 2022-06-27 DIAGNOSIS — Z83719 Family history of colon polyps, unspecified: Secondary | ICD-10-CM

## 2022-06-27 DIAGNOSIS — R509 Fever, unspecified: Secondary | ICD-10-CM | POA: Diagnosis not present

## 2022-06-27 DIAGNOSIS — A4159 Other Gram-negative sepsis: Principal | ICD-10-CM | POA: Diagnosis present

## 2022-06-27 DIAGNOSIS — F1010 Alcohol abuse, uncomplicated: Secondary | ICD-10-CM | POA: Diagnosis not present

## 2022-06-27 DIAGNOSIS — G8929 Other chronic pain: Secondary | ICD-10-CM | POA: Diagnosis present

## 2022-06-27 DIAGNOSIS — R5383 Other fatigue: Secondary | ICD-10-CM | POA: Diagnosis not present

## 2022-06-27 DIAGNOSIS — E861 Hypovolemia: Secondary | ICD-10-CM | POA: Diagnosis present

## 2022-06-27 DIAGNOSIS — J9811 Atelectasis: Secondary | ICD-10-CM | POA: Diagnosis not present

## 2022-06-27 DIAGNOSIS — D649 Anemia, unspecified: Secondary | ICD-10-CM | POA: Diagnosis not present

## 2022-06-27 DIAGNOSIS — G253 Myoclonus: Secondary | ICD-10-CM | POA: Diagnosis not present

## 2022-06-27 DIAGNOSIS — A045 Campylobacter enteritis: Secondary | ICD-10-CM | POA: Diagnosis not present

## 2022-06-27 DIAGNOSIS — Z800 Family history of malignant neoplasm of digestive organs: Secondary | ICD-10-CM

## 2022-06-27 DIAGNOSIS — Z833 Family history of diabetes mellitus: Secondary | ICD-10-CM

## 2022-06-27 DIAGNOSIS — Z823 Family history of stroke: Secondary | ICD-10-CM

## 2022-06-27 DIAGNOSIS — R55 Syncope and collapse: Secondary | ICD-10-CM | POA: Diagnosis not present

## 2022-06-27 DIAGNOSIS — Z9884 Bariatric surgery status: Secondary | ICD-10-CM

## 2022-06-27 DIAGNOSIS — R253 Fasciculation: Secondary | ICD-10-CM

## 2022-06-27 DIAGNOSIS — Z886 Allergy status to analgesic agent status: Secondary | ICD-10-CM

## 2022-06-27 DIAGNOSIS — R109 Unspecified abdominal pain: Secondary | ICD-10-CM | POA: Diagnosis not present

## 2022-06-27 DIAGNOSIS — Z79899 Other long term (current) drug therapy: Secondary | ICD-10-CM

## 2022-06-27 DIAGNOSIS — F32A Depression, unspecified: Secondary | ICD-10-CM | POA: Diagnosis not present

## 2022-06-27 DIAGNOSIS — K449 Diaphragmatic hernia without obstruction or gangrene: Secondary | ICD-10-CM | POA: Diagnosis not present

## 2022-06-27 DIAGNOSIS — K529 Noninfective gastroenteritis and colitis, unspecified: Secondary | ICD-10-CM | POA: Diagnosis present

## 2022-06-27 DIAGNOSIS — D259 Leiomyoma of uterus, unspecified: Secondary | ICD-10-CM | POA: Diagnosis present

## 2022-06-27 DIAGNOSIS — Z8616 Personal history of COVID-19: Secondary | ICD-10-CM

## 2022-06-27 DIAGNOSIS — R413 Other amnesia: Secondary | ICD-10-CM | POA: Diagnosis not present

## 2022-06-27 DIAGNOSIS — E876 Hypokalemia: Secondary | ICD-10-CM | POA: Diagnosis not present

## 2022-06-27 DIAGNOSIS — M5127 Other intervertebral disc displacement, lumbosacral region: Secondary | ICD-10-CM | POA: Diagnosis not present

## 2022-06-27 DIAGNOSIS — A419 Sepsis, unspecified organism: Secondary | ICD-10-CM | POA: Diagnosis not present

## 2022-06-27 DIAGNOSIS — R7303 Prediabetes: Secondary | ICD-10-CM | POA: Diagnosis not present

## 2022-06-27 DIAGNOSIS — R251 Tremor, unspecified: Secondary | ICD-10-CM | POA: Diagnosis not present

## 2022-06-27 DIAGNOSIS — Z8249 Family history of ischemic heart disease and other diseases of the circulatory system: Secondary | ICD-10-CM

## 2022-06-27 LAB — COMPREHENSIVE METABOLIC PANEL
ALT: 24 U/L (ref 0–44)
AST: 25 U/L (ref 15–41)
Albumin: 3.4 g/dL — ABNORMAL LOW (ref 3.5–5.0)
Alkaline Phosphatase: 63 U/L (ref 38–126)
Anion gap: 12 (ref 5–15)
BUN: 13 mg/dL (ref 6–20)
CO2: 24 mmol/L (ref 22–32)
Calcium: 9.5 mg/dL (ref 8.9–10.3)
Chloride: 101 mmol/L (ref 98–111)
Creatinine, Ser: 0.98 mg/dL (ref 0.44–1.00)
GFR, Estimated: >60 mL/min (ref >60)
Glucose, Bld: 111 mg/dL — ABNORMAL HIGH (ref 70–99)
Potassium: 3.8 mmol/L (ref 3.5–5.1)
Sodium: 137 mmol/L (ref 135–145)
Total Bilirubin: 0.4 mg/dL (ref 0.3–1.2)
Total Protein: 6.4 g/dL — ABNORMAL LOW (ref 6.5–8.1)

## 2022-06-27 LAB — PROTIME-INR
INR: 1.1 (ref 0.8–1.2)
Prothrombin Time: 13.9 seconds (ref 11.4–15.2)

## 2022-06-27 LAB — CBC WITH DIFFERENTIAL/PLATELET
Abs Immature Granulocytes: 0.04 10*3/uL (ref 0.00–0.07)
Basophils Absolute: 0 10*3/uL (ref 0.0–0.1)
Basophils Relative: 0 %
Eosinophils Absolute: 0 10*3/uL (ref 0.0–0.5)
Eosinophils Relative: 0 %
HCT: 40.1 % (ref 36.0–46.0)
Hemoglobin: 12.6 g/dL (ref 12.0–15.0)
Immature Granulocytes: 1 %
Lymphocytes Relative: 11 %
Lymphs Abs: 1 10*3/uL (ref 0.7–4.0)
MCH: 23.4 pg — ABNORMAL LOW (ref 26.0–34.0)
MCHC: 31.4 g/dL (ref 30.0–36.0)
MCV: 74.5 fL — ABNORMAL LOW (ref 80.0–100.0)
Monocytes Absolute: 0.8 10*3/uL (ref 0.1–1.0)
Monocytes Relative: 9 %
Neutro Abs: 6.6 10*3/uL (ref 1.7–7.7)
Neutrophils Relative %: 79 %
Platelets: 215 10*3/uL (ref 150–400)
RBC: 5.38 MIL/uL — ABNORMAL HIGH (ref 3.87–5.11)
RDW: 14.2 % (ref 11.5–15.5)
WBC: 8.4 10*3/uL (ref 4.0–10.5)
nRBC: 0 % (ref 0.0–0.2)

## 2022-06-27 LAB — T4, FREE: Free T4: 0.68 ng/dL (ref 0.61–1.12)

## 2022-06-27 LAB — I-STAT BETA HCG BLOOD, ED (MC, WL, AP ONLY): I-stat hCG, quantitative: <5 m[IU]/mL (ref <5)

## 2022-06-27 LAB — RESP PANEL BY RT-PCR (FLU A&B, COVID) ARPGX2
Influenza A by PCR: NEGATIVE
Influenza B by PCR: NEGATIVE
SARS Coronavirus 2 by RT PCR: NEGATIVE

## 2022-06-27 LAB — URINALYSIS, ROUTINE W REFLEX MICROSCOPIC
Bilirubin Urine: NEGATIVE
Glucose, UA: NEGATIVE mg/dL
Hgb urine dipstick: NEGATIVE
Ketones, ur: NEGATIVE mg/dL
Leukocytes,Ua: NEGATIVE
Nitrite: NEGATIVE
Protein, ur: NEGATIVE mg/dL
Specific Gravity, Urine: 1.019 (ref 1.005–1.030)
pH: 5 (ref 5.0–8.0)

## 2022-06-27 LAB — D-DIMER, QUANTITATIVE: D-Dimer, Quant: 0.75 ug/mL-FEU — ABNORMAL HIGH (ref 0.00–0.50)

## 2022-06-27 LAB — APTT: aPTT: 26 seconds (ref 24–36)

## 2022-06-27 LAB — TROPONIN I (HIGH SENSITIVITY)
Troponin I (High Sensitivity): 3 ng/L (ref <18)
Troponin I (High Sensitivity): 7 ng/L (ref <18)

## 2022-06-27 LAB — LACTIC ACID, PLASMA: Lactic Acid, Venous: 1.7 mmol/L (ref 0.5–1.9)

## 2022-06-27 LAB — LIPASE, BLOOD: Lipase: 32 U/L (ref 11–51)

## 2022-06-27 LAB — ETHANOL: Alcohol, Ethyl (B): <10 mg/dL (ref <10)

## 2022-06-27 MED ORDER — VANCOMYCIN HCL 1750 MG/350ML IV SOLN
1750.0000 mg | Freq: Once | INTRAVENOUS | Status: AC
  Administered 2022-06-27: 1750 mg via INTRAVENOUS
  Filled 2022-06-27: qty 350

## 2022-06-27 MED ORDER — LACTATED RINGERS IV BOLUS (SEPSIS)
1000.0000 mL | Freq: Once | INTRAVENOUS | Status: AC
  Administered 2022-06-27: 1000 mL via INTRAVENOUS

## 2022-06-27 MED ORDER — MORPHINE SULFATE (PF) 4 MG/ML IV SOLN
4.0000 mg | Freq: Once | INTRAVENOUS | Status: AC
  Administered 2022-06-27: 4 mg via INTRAVENOUS
  Filled 2022-06-27: qty 1

## 2022-06-27 MED ORDER — ACETAMINOPHEN 500 MG PO TABS
1000.0000 mg | ORAL_TABLET | Freq: Once | ORAL | Status: AC
  Administered 2022-06-27: 1000 mg via ORAL
  Filled 2022-06-27: qty 2

## 2022-06-27 MED ORDER — ONDANSETRON HCL 4 MG/2ML IJ SOLN
4.0000 mg | Freq: Once | INTRAMUSCULAR | Status: AC
  Administered 2022-06-27: 4 mg via INTRAVENOUS
  Filled 2022-06-27: qty 2

## 2022-06-27 MED ORDER — ACETAMINOPHEN 325 MG PO TABS
650.0000 mg | ORAL_TABLET | Freq: Once | ORAL | Status: AC
  Administered 2022-06-27: 650 mg via ORAL
  Filled 2022-06-27: qty 2

## 2022-06-27 MED ORDER — METRONIDAZOLE 500 MG/100ML IV SOLN
500.0000 mg | Freq: Once | INTRAVENOUS | Status: AC
  Administered 2022-06-27: 500 mg via INTRAVENOUS
  Filled 2022-06-27: qty 100

## 2022-06-27 MED ORDER — LACTATED RINGERS IV SOLN: INTRAVENOUS | Status: DC

## 2022-06-27 MED ORDER — VANCOMYCIN HCL IN DEXTROSE 1-5 GM/200ML-% IV SOLN: 1000.0000 mg | Freq: Once | INTRAVENOUS | Status: DC

## 2022-06-27 MED ORDER — SODIUM CHLORIDE 0.9 % IV SOLN
2.0000 g | Freq: Three times a day (TID) | INTRAVENOUS | Status: DC
  Administered 2022-06-27: 2 g via INTRAVENOUS
  Filled 2022-06-27: qty 12.5

## 2022-06-27 MED ORDER — SODIUM CHLORIDE 0.9 % IV SOLN
2.0000 g | Freq: Once | INTRAVENOUS | Status: AC
  Administered 2022-06-27: 2 g via INTRAVENOUS
  Filled 2022-06-27: qty 12.5

## 2022-06-27 MED ORDER — IOHEXOL 350 MG/ML SOLN
100.0000 mL | Freq: Once | INTRAVENOUS | Status: AC | PRN
  Administered 2022-06-27: 100 mL via INTRAVENOUS

## 2022-06-27 MED ORDER — VANCOMYCIN HCL 1250 MG/250ML IV SOLN
1250.0000 mg | Freq: Two times a day (BID) | INTRAVENOUS | Status: DC
  Administered 2022-06-28 – 2022-06-29 (×3): 1250 mg via INTRAVENOUS
  Filled 2022-06-27 (×4): qty 250

## 2022-06-27 MED ORDER — LORAZEPAM 2 MG/ML IJ SOLN
1.0000 mg | Freq: Once | INTRAMUSCULAR | Status: AC
  Administered 2022-06-27: 1 mg via INTRAVENOUS
  Filled 2022-06-27: qty 1

## 2022-06-27 NOTE — ED Notes (Signed)
Pt wheeled to and from the restroom without incident. Upon returning to the room she got out of the chair and stated she would like to walk to the sink to wash her hands. Pt ambulatory with independent steady gait. When she was washing her hands she began to have another shaking episode and began to fall to the floor. This RN noticed and quickly grabbed her pants and placed my knee behind her bottom and assisted her to the floor. Pt did not fall down or hit the floor. This RN and her husband was able to have her get up and get her back into the bed. Britni, PA informed of incident via face to face conversation.

## 2022-06-27 NOTE — Progress Notes (Signed)
Pharmacy Antibiotic Note  Mary Sanford is a 56 y.o. female admitted on 06/27/2022 with sepsis.  Pharmacy has been consulted for vancomycin and cefepime dosing. Patient collapsed on presentation with intermittent shaking. CT head, and CXR pending. Feb to 102.16F   Plan: Cefepime 2g IV q8h Vancomycin 1750 x1 as load followed by vancomycin '1250mg'$  IV q 12h (predicted AUC 449, goal 400-550) -vanc levels per protocol  F//u renal func, MRSA PCR, imaging studies    Height: '5\' 9"'$  (175.3 cm) Weight: 89.4 kg (197 lb) IBW/kg (Calculated) : 66.2  Temp (24hrs), Avg:102.8 F (39.3 C), Min:102.8 F (39.3 C), Max:102.8 F (39.3 C)  No results for input(s): "WBC", "CREATININE", "LATICACIDVEN", "VANCOTROUGH", "VANCOPEAK", "VANCORANDOM", "GENTTROUGH", "GENTPEAK", "GENTRANDOM", "TOBRATROUGH", "TOBRAPEAK", "TOBRARND", "AMIKACINPEAK", "AMIKACINTROU", "AMIKACIN" in the last 168 hours.  CrCl cannot be calculated (Patient's most recent lab result is older than the maximum 21 days allowed.).    Allergies  Allergen Reactions   Naproxen Other (See Comments)    Panic attacks No Problem with Motrin    Antimicrobials this admission: cefepime 11/24 >>  Vancomycin 11/24 >>  Flagyl 11/24>  Dose adjustments this admission:   Microbiology results: 11/24 BCx:  11/24 UCx:   11/24 RVP:  11/24 MRSA PCR:   Thank you for allowing pharmacy to be a part of this patient's care.  Wilson Singer, PharmD Clinical Pharmacist 06/27/2022 3:54 PM

## 2022-06-27 NOTE — ED Notes (Signed)
Patient transported to CT 

## 2022-06-27 NOTE — ED Notes (Addendum)
Dr Thomas at bedside

## 2022-06-27 NOTE — ED Provider Triage Note (Signed)
Emergency Medicine Provider Triage Evaluation Note  Mary Sanford , a 56 y.o. female  was evaluated in triage.  Pt complains of shaking.  Patient reports feeling very fatigued yesterday and then developing shaking of her head today.  Found to have a fever to 102.8 F.  No history of seizure-like activity.  Patient has recently started Percocet and Flexeril for back pain.  Also had a recent visit with neurology due to forgetfulness.  She reportedly "collapsed" upon arrival to the emergency department.  Review of Systems  Positive: Shaking, fever Negative: Vomiting  Physical Exam  BP (!) 151/101 (BP Location: Left Arm)   Pulse (!) 122   Temp (!) 102.8 F (39.3 C)   Resp 17   Ht '5\' 9"'$  (1.753 m)   Wt 89.4 kg   SpO2 97%   BMI 29.09 kg/m  Gen:   Awake, no distress   Resp:  Normal effort  MSK:   Moves extremities without difficulty  Other:  Tachycardic, lungs clear.  Patient with some jerking of her head and neck, however no observed activity suggestive of seizure.  Medical Decision Making  Medically screening exam initiated at 3:28 PM.  Appropriate orders placed.  ALUEL SCHWARZ was informed that the remainder of the evaluation will be completed by another provider, this initial triage assessment does not replace that evaluation, and the importance of remaining in the ED until their evaluation is complete.     Carlisle Cater, PA-C 06/27/22 1535

## 2022-06-27 NOTE — Sepsis Progress Note (Signed)
Code Sepsis protocol being monitored by e[employer]. 

## 2022-06-27 NOTE — ED Provider Notes (Signed)
[facility] EMERGENCY DEPARTMENT Provider Note   CSN: [id number] Arrival date & time: 06/27/22  1512     History  Chief Complaint  Patient presents with   Medication Reaction   Fever    Mary Sanford is a 56 y.o. female history of hypertension, back pain, prediabetes, memory changes here for evaluation of feeling unwell.  Yesterday noted to have extreme fatigue.  Woke up this morning have a mild headache and intermittent twitching described as a jerking sensation.  Has been having some lower back pain, worse on the right hx of similar chronic pain.  She was seen by her PCP 2 days ago for this.  States she has chronic back pain.  She thought this was due to preparing for Thanksgiving.  She feels like she is having a medication reaction due to the recently prescribed medication by her PCP for her back pain.  Denies any numbness, weakness, chest pain, shortness of breath, dysuria or hematuria.  Some loose stool over the last 2 days without melena or BRBPR. No recent abx. Had a syncopal episode on the way into the emergency department.  States she was walking one minute and the next minute she does not know what happened. No history of seizure-like activity. Husband states patient has been dealing with memory changes over the last few months, followed by neurology who thought this was due to stress. No recent changes in mentation per family aside from ongoing memory problems for months. Some mild generalized abd pain without change in BM.  HPI     Home Medications Prior to Admission medications   Medication Sig Start Date End Date Taking? Authorizing Provider  acetaminophen (TYLENOL) 500 MG tablet Take 500-1,000 mg by mouth as needed for headache or moderate pain.   Yes [provider]  albuterol (VENTOLIN HFA) 108 (90 Base) MCG/ACT inhaler Inhale 1-2 puffs into the lungs every 6 (six) hours as needed for wheezing or shortness of breath (for seasonal  allergies). Patient taking differently: Inhale 1-2 puffs into the lungs as needed for wheezing or shortness of breath (for seasonal allergies). 06/15/20  Yes [name], MD  APPLE CIDER VINEGAR PO Take 2 tablets by mouth daily.   Yes [provider]  Cholecalciferol (VITAMIN D3) 25 MCG (1000 UT) CHEW Chew 2 tablets by mouth daily.   Yes [provider]  cyanocobalamin (VITAMIN B12) 1000 MCG tablet Take 1 tablet (1,000 mcg total) by mouth daily. 06/17/22 06/12/23 Yes Alric Ran, MD  cyclobenzaprine (FLEXERIL) 10 MG tablet Take 1 tablet (10 mg total) by mouth 3 (three) times daily as needed for muscle spasms. 06/23/22  Yes Horald Pollen, MD  escitalopram (LEXAPRO) 20 MG tablet Take 1 tablet by mouth once daily 05/18/22  Yes [name], MD  ibuprofen (ADVIL) 200 MG tablet Take 600 mg by mouth every 6 (six) hours as needed for headache, mild pain or moderate pain.   Yes [provider]  nebivolol (BYSTOLIC) 5 MG tablet Take 1 tablet (5 mg total) by mouth daily. 05/28/22  Yes [name], MD  oxyCODONE-acetaminophen (PERCOCET/ROXICET) 5-325 MG tablet Take 1 tablet by mouth every 8 (eight) hours as needed for severe pain. 06/23/22  Yes Sagardia, Ines Bloomer, MD  phentermine 37.5 MG capsule Take 1 capsule (37.5 mg total) by mouth every morning. 05/28/22  Yes [name], MD  triamterene-hydrochlorothiazide [facility]) 37.5-25 MG tablet Take 1 tablet by mouth once daily 06/16/22  Yes [name],  MD  potassium chloride (K-DUR) 10 MEQ tablet Take 1 tablet (10 mEq total) by mouth 2 (two) times daily. 05/13/12 06/27/13  [name], MD      Allergies    Naproxen    Review of Systems   Review of Systems  Constitutional:  Positive for activity change, appetite change, chills, fatigue and fever.  HENT: Negative.    Respiratory: Negative.    Cardiovascular: Negative.   Gastrointestinal:  Positive for abdominal pain and nausea. Negative for  abdominal distention, anal bleeding, blood in stool, constipation, diarrhea, rectal pain and vomiting.  Musculoskeletal:  Positive for back pain. Negative for arthralgias, gait problem, joint swelling, myalgias, neck pain and neck stiffness.  Skin: Negative.   Neurological:  Positive for tremors (intermittent tremors and twiching), syncope, weakness (generalized), light-headedness and headaches. Negative for dizziness and numbness.  All other systems reviewed and are negative.   Physical Exam Updated Vital Signs BP (!) 154/98   Pulse (!) 109   Temp (!) 103.1 F (39.5 C) (Oral)   Resp 20   Ht '5\' 9"'$  (1.753 m)   Wt 89.4 kg   SpO2 100%   BMI 29.09 kg/m  Physical Exam Vitals and nursing note reviewed.  Constitutional:      General: She is not in acute distress.    Appearance: She is well-developed. She is ill-appearing. She is not toxic-appearing or diaphoretic.  HENT:     Head: Normocephalic and atraumatic.     Nose: Nose normal.     Mouth/Throat:     Mouth: Mucous membranes are moist.  Eyes:     Pupils: Pupils are equal, round, and reactive to light.  Cardiovascular:     Rate and Rhythm: Tachycardia present.     Pulses: Normal pulses.     Heart sounds: Normal heart sounds.  Pulmonary:     Effort: Pulmonary effort is normal. No respiratory distress.     Breath sounds: Normal breath sounds.     Comments: Clear bil, speaks without difficulty Abdominal:     General: Bowel sounds are normal. There is no distension.     Palpations: Abdomen is soft.     Tenderness: There is no abdominal tenderness. There is no right CVA tenderness, left CVA tenderness or guarding.     Comments: Soft diffusely tender  Musculoskeletal:        General: Normal range of motion.     Cervical back: Normal range of motion.     Comments: No bony tenderness  Skin:    General: Skin is warm and dry.     Capillary Refill: Capillary refill takes less than 2 seconds.     Comments: No obvious rash or lesions   Neurological:     Mental Status: She is alert.     Cranial Nerves: Cranial nerves 2-12 are intact.     Sensory: Sensation is intact.     Comments: Cranial nerves II through XII grossly intact Intact sensation Equal strength Remittent twitching/jerking motions to head, neck.  No focalization.  Able to speak during these episodes.  No unilateral gaze.  No seizure-like activity. Intermittent jerking motion with finger-to-nose Can not assess gait at this time Alert to person, place, time however slow to answer  Psychiatric:        Mood and Affect: Mood normal.    ED Results / Procedures / Treatments   Labs (all labs ordered are listed, but only abnormal results are displayed) Labs Reviewed  COMPREHENSIVE METABOLIC PANEL - Abnormal;  Notable for the following components:      Result Value   Glucose, Bld 111 (*)    Total Protein 6.4 (*)    Albumin 3.4 (*)    All other components within normal limits  CBC WITH DIFFERENTIAL/PLATELET - Abnormal; Notable for the following components:   RBC 5.38 (*)    MCV 74.5 (*)    MCH 23.4 (*)    All other components within normal limits  URINALYSIS, ROUTINE W REFLEX MICROSCOPIC - Abnormal; Notable for the following components:   Color, Urine STRAW (*)    All other components within normal limits  D-DIMER, QUANTITATIVE - Abnormal; Notable for the following components:   D-Dimer, Quant 0.75 (*)    All other components within normal limits  RESP PANEL BY RT-PCR (FLU A&B, COVID) [id number]  CULTURE, BLOOD (ROUTINE X 2)  CULTURE, BLOOD (ROUTINE X 2)  URINE CULTURE  MRSA NEXT GEN BY PCR, NASAL  LACTIC ACID, PLASMA  PROTIME-INR  APTT  ETHANOL  T4, FREE  LIPASE, BLOOD  RAPID URINE DRUG SCREEN, HOSP PERFORMED  TSH  I-STAT BETA HCG BLOOD, ED ([facility], [facility], [facility] ONLY)  TROPONIN I (HIGH SENSITIVITY)  TROPONIN I (HIGH SENSITIVITY)    EKG EKG Interpretation  Date/Time:  Friday June 27 2022 15:18:29 EST Ventricular Rate:  120 PR Interval:  144 QRS  Duration: 96 QT Interval:  318 QTC Calculation: 449 R Axis:   48 Text Interpretation: Sinus tachycardia Otherwise normal ECG When compared with ECG of 24-Oct-2021 03:23,  rate is increased Confirmed by Aletta Edouard [id number]) on 06/27/2022 3:42:10 PM  Radiology CT Angio Chest PE W and/or Wo Contrast  Result Date: 06/27/2022 CLINICAL DATA:  Sepsis EXAM: CT ANGIOGRAPHY CHEST CT ABDOMEN AND PELVIS WITH CONTRAST TECHNIQUE: Multidetector CT imaging of the chest was performed using the standard protocol during bolus administration of intravenous contrast. Multiplanar CT image reconstructions and MIPs were obtained to evaluate the vascular anatomy. Multidetector CT imaging of the abdomen and pelvis was performed using the standard protocol during bolus administration of intravenous contrast. RADIATION DOSE REDUCTION: This exam was performed according to the departmental dose-optimization program which includes automated exposure control, adjustment of the mA and/or kV according to patient size and/or use of iterative reconstruction technique. CONTRAST:  156m OMNIPAQUE IOHEXOL 350 MG/ML SOLN COMPARISON:  None Available. FINDINGS: CTA CHEST FINDINGS Cardiovascular: No evidence of pulmonary embolus, although bolus timing somewhat limits evaluation of the segmental and subsegmental pulmonary arteries. Normal heart size. No pericardial effusion. Normal caliber thoracic aorta with no atherosclerotic disease. Mediastinum/Nodes: Small hiatal hernia. Thyroid is unremarkable. No pathologically enlarged lymph nodes seen in the chest Lungs/Pleura: Central airways are patent. Mild bibasilar atelectasis no consolidation, pleural effusion or pneumothorax Musculoskeletal: No chest wall abnormality. No acute or significant osseous findings. Review of the MIP images confirms the above findings. CT ABDOMEN and PELVIS FINDINGS Hepatobiliary: No focal liver abnormality is seen. No gallstones, gallbladder wall thickening, or biliary  dilatation. Pancreas: Unremarkable. No pancreatic ductal dilatation or surrounding inflammatory changes. Spleen: Normal in size without focal abnormality. Adrenals/Urinary Tract: Bilateral adrenal glands are unremarkable. No hydronephrosis or nephrolithiasis. Bladder is unremarkable. Stomach/Bowel: Postsurgical changes of the stomach. Mild diffuse colonic wall thickening. Mild wall thickening of the proximal appendix with no periappendiceal fat stranding, appendix measures up to 10 mm in diameter. No evidence of obstruction Vascular/Lymphatic: No significant vascular findings are present. No enlarged abdominal or pelvic lymph nodes. Reproductive: Heterogeneous enhancement of the uterus. No adnexal masses. Other: No abdominal wall hernia  or abnormality. No abdominopelvic ascites. Musculoskeletal: No acute or significant osseous findings. Review of the MIP images confirms the above findings. IMPRESSION: 1. No evidence of pulmonary embolus, although bolus timing somewhat limits evaluation of the segmental and subsegmental pulmonary arteries. 2. Mild diffuse colonic wall thickening, finding can be seen in the setting of colitis. 3. Mild wall thickening of the proximal appendix with no periappendiceal fat stranding, likely due to secondary involvement. Correlate for symptoms of right lower quadrant pain. 4. Heterogeneous enhancement of the uterus, likely due to multiple small fibroids fibroids. Finding could be further evaluated with nonemergent pelvic ultrasound. Electronically Signed   By: Yetta Glassman M.D.   On: 06/27/2022 21:00   CT ABDOMEN PELVIS W CONTRAST  Result Date: 06/27/2022 CLINICAL DATA:  Sepsis EXAM: CT ANGIOGRAPHY CHEST CT ABDOMEN AND PELVIS WITH CONTRAST TECHNIQUE: Multidetector CT imaging of the chest was performed using the standard protocol during bolus administration of intravenous contrast. Multiplanar CT image reconstructions and MIPs were obtained to evaluate the vascular anatomy.  Multidetector CT imaging of the abdomen and pelvis was performed using the standard protocol during bolus administration of intravenous contrast. RADIATION DOSE REDUCTION: This exam was performed according to the departmental dose-optimization program which includes automated exposure control, adjustment of the mA and/or kV according to patient size and/or use of iterative reconstruction technique. CONTRAST:  134m OMNIPAQUE IOHEXOL 350 MG/ML SOLN COMPARISON:  None Available. FINDINGS: CTA CHEST FINDINGS Cardiovascular: No evidence of pulmonary embolus, although bolus timing somewhat limits evaluation of the segmental and subsegmental pulmonary arteries. Normal heart size. No pericardial effusion. Normal caliber thoracic aorta with no atherosclerotic disease. Mediastinum/Nodes: Small hiatal hernia. Thyroid is unremarkable. No pathologically enlarged lymph nodes seen in the chest Lungs/Pleura: Central airways are patent. Mild bibasilar atelectasis no consolidation, pleural effusion or pneumothorax Musculoskeletal: No chest wall abnormality. No acute or significant osseous findings. Review of the MIP images confirms the above findings. CT ABDOMEN and PELVIS FINDINGS Hepatobiliary: No focal liver abnormality is seen. No gallstones, gallbladder wall thickening, or biliary dilatation. Pancreas: Unremarkable. No pancreatic ductal dilatation or surrounding inflammatory changes. Spleen: Normal in size without focal abnormality. Adrenals/Urinary Tract: Bilateral adrenal glands are unremarkable. No hydronephrosis or nephrolithiasis. Bladder is unremarkable. Stomach/Bowel: Postsurgical changes of the stomach. Mild diffuse colonic wall thickening. Mild wall thickening of the proximal appendix with no periappendiceal fat stranding, appendix measures up to 10 mm in diameter. No evidence of obstruction Vascular/Lymphatic: No significant vascular findings are present. No enlarged abdominal or pelvic lymph nodes. Reproductive:  Heterogeneous enhancement of the uterus. No adnexal masses. Other: No abdominal wall hernia or abnormality. No abdominopelvic ascites. Musculoskeletal: No acute or significant osseous findings. Review of the MIP images confirms the above findings. IMPRESSION: 1. No evidence of pulmonary embolus, although bolus timing somewhat limits evaluation of the segmental and subsegmental pulmonary arteries. 2. Mild diffuse colonic wall thickening, finding can be seen in the setting of colitis. 3. Mild wall thickening of the proximal appendix with no periappendiceal fat stranding, likely due to secondary involvement. Correlate for symptoms of right lower quadrant pain. 4. Heterogeneous enhancement of the uterus, likely due to multiple small fibroids fibroids. Finding could be further evaluated with nonemergent pelvic ultrasound. Electronically Signed   By: LYetta GlassmanM.D.   On: 06/27/2022 21:00   CT L-SPINE NO CHARGE  Result Date: 06/27/2022 CLINICAL DATA:  Patient complains of shaking.  Feeling fatigued. EXAM: CT LUMBAR SPINE WITHOUT CONTRAST TECHNIQUE: Multidetector CT imaging of the lumbar spine was performed without intravenous  contrast administration. Multiplanar CT image reconstructions were also generated. RADIATION DOSE REDUCTION: This exam was performed according to the departmental dose-optimization program which includes automated exposure control, adjustment of the mA and/or kV according to patient size and/or use of iterative reconstruction technique. COMPARISON:  None Available. FINDINGS: Segmentation: 5 lumbar type vertebrae. Alignment: Normal. Vertebrae: No acute fracture or focal pathologic process. Paraspinal and other soft tissues: Negative. Disc levels: Mild disc bulge at L4-L5 at L5-S1 without significant spinal canal or foraminal stenosis. Mild facet joint arthropathy at L4-L5 and L5-S1. IMPRESSION: 1. No acute fracture or subluxation. 2. Mild disc bulge at L4-L5 and L5-S1 without significant  spinal canal or foraminal stenosis. 3. Mild facet joint arthropathy at L4-L5 and L5-S1. Electronically Signed   By: Keane Police D.O.   On: 06/27/2022 20:46   CT HEAD WO CONTRAST (5MM)  Result Date: 06/27/2022 CLINICAL DATA:  Fatigue. EXAM: CT HEAD WITHOUT CONTRAST TECHNIQUE: Contiguous axial images were obtained from the base of the skull through the vertex without intravenous contrast. RADIATION DOSE REDUCTION: This exam was performed according to the departmental dose-optimization program which includes automated exposure control, adjustment of the mA and/or kV according to patient size and/or use of iterative reconstruction technique. COMPARISON:  August 25, 2018 FINDINGS: Brain: No evidence of acute infarction, hemorrhage, hydrocephalus, extra-axial collection or mass lesion/mass effect. Vascular: No hyperdense vessel or unexpected calcification. Skull: Normal. Negative for fracture or focal lesion. Sinuses/Orbits: No acute finding. Other: None. IMPRESSION: No acute intracranial pathology. Electronically Signed   By: Virgina Norfolk M.D.   On: 06/27/2022 20:40   DG Chest Portable 1 View  Result Date: 06/27/2022 CLINICAL DATA:  Fever, sepsis EXAM: PORTABLE CHEST 1 VIEW COMPARISON:  Chest radiograph 06/23/2021 FINDINGS: The cardiomediastinal silhouette is normal. There is no focal consolidation or pulmonary edema. There is no pleural effusion or pneumothorax There is no acute osseous abnormality. IMPRESSION: No radiographic evidence of acute cardiopulmonary process. Electronically Signed   By: Valetta Mole M.D.   On: 06/27/2022 17:33    Procedures .Critical Care  Performed by: Nettie Elm, PA-C Authorized by: Nettie Elm, PA-C   Critical care provider statement:    Critical care time (minutes):  36   Critical care was necessary to treat or prevent imminent or life-threatening deterioration of the following conditions:  Sepsis   Critical care was time spent personally by me on  the following activities:  Development of treatment plan with patient or surrogate, discussions with consultants, evaluation of patient's response to treatment, examination of patient, ordering and review of laboratory studies, ordering and review of radiographic studies, ordering and performing treatments and interventions, pulse oximetry, re-evaluation of patient's condition and review of old charts     Medications Ordered in ED Medications  lactated ringers infusion ( Intravenous New Bag/Given 06/27/22 1651)  ceFEPIme (MAXIPIME) 2 g in sodium chloride 0.9 % 100 mL IVPB (has no administration in time range)  vancomycin (VANCOREADY) IVPB 1250 mg/250 mL (has no administration in time range)  ondansetron (ZOFRAN) injection 4 mg (has no administration in time range)  morphine (PF) 4 MG/ML injection 4 mg (has no administration in time range)  acetaminophen (TYLENOL) tablet 1,000 mg (1,000 mg Oral Given 06/27/22 1539)  ceFEPIme (MAXIPIME) 2 g in sodium chloride 0.9 % 100 mL IVPB (0 g Intravenous Stopped 06/27/22 1641)  metroNIDAZOLE (FLAGYL) IVPB 500 mg (0 mg Intravenous Stopped 06/27/22 1825)  lactated ringers bolus 1,000 mL (0 mLs Intravenous Stopped 06/27/22 2031)  vancomycin (VANCOREADY)  IVPB 1750 mg/350 mL (0 mg Intravenous Stopped 06/27/22 2124)  LORazepam (ATIVAN) injection 1 mg (1 mg Intravenous Given 06/27/22 1603)  iohexol (OMNIPAQUE) 350 MG/ML injection 100 mL (100 mLs Intravenous Contrast Given 06/27/22 1941)  acetaminophen (TYLENOL) tablet 650 mg (650 mg Oral Given 06/27/22 2213)    ED Course/ Medical Decision Making/ A&P    56 year old multiple chronic medical problems here for evaluation of feeling unwell.  On arrival she is febrile, tachycardic.  She has intermittent twitching however is able to speak through this, no focal seizure-like activity however does appear very anxious.  Had syncopal episode on arrival to the emergency department while walking in.  Seen by PCP few days  ago for back pain.  Has had family members out of been sick, questionable RSV vs FLU?  Code sepsis called on arrival.  Broad-spectrum antibiotics started  Labs and imaging personally viewed and interpreted:  Chest xray without acute abnormality EKG with tachycardia CBC without leukocytosis Lactic acid 1.7 CMP without significant abnormality Trop 3 Ddimer 0.75, CTA added Preg neg Lipase 32 Etoh <10 COVID, FLU neg UA neg for infection Chest x-ray without infiltrates, cardiomegaly, pulm edema, pneumothorax CT head without significant abnormality CT lumbar without significant abnormality CTA chest without infiltrates, cardiomegaly pulm edema CT AP with possible colitis, mild thickening of the proximal appendix however no stranding to suggest acute appendicitis, correlate with exam   Patient reasessed.  Temperature coming down, still persistently tachycardic, tachypneic however no hypotension.  Jerking has improved however still occasionally present. Ddimer elevated will get imaging.  Nursing made me aware that patient got up to use the restroom.  She went to wash her hands when she started to have "jerking" like motion.  Nursing was able to catch patient.  She did not fall to the ground.  Patient was speaking throughout the entire event and per nursing did not have any focal deficits, postictal phase.  No tongue biting, urinary incontinence.  Reassessed.  States she hurts all over.  Has diffuse tenderness on abdominal exam however no focal pain, specifically no pain to her right lower quadrant.  States she has a headache, her muscles ache.  She has a nonfocal neuroexam without deficits.  Unclear etiology of her earlier jerking.  Will touch base with neurology.  Family state now grandson was officially diagnosed with RSV.  Will add on RSV to her viral panel.  Will admit for further management and workup.  CONSULT with Dr. Lorrin Goodell with Neuro who will see patient in consult.  Does  recommend holding off on cefepime this can cause abnormal behavior.  CONSULT with Dr. Marcello Moores with [facility] who is agreeable to evaluate patient for admission  The patient appears reasonably stabilized for admission considering the current resources, flow, and capabilities available in the ED at this time, and I doubt any other [facility] requiring further screening and/or treatment in the ED prior to admission.                            Medical Decision Making Amount and/or Complexity of Data Reviewed Independent Historian: spouse External Data Reviewed: labs, radiology, ECG and notes. Labs: ordered. Decision-making details documented in ED Course. Radiology: ordered and independent interpretation performed. Decision-making details documented in ED Course. ECG/medicine tests: ordered and independent interpretation performed. Decision-making details documented in ED Course.  Risk OTC drugs. Prescription drug management. Parenteral controlled substances. Decision regarding hospitalization. Diagnosis or treatment significantly limited by social determinants  of health.          Final Clinical Impression(s) / ED Diagnoses Final diagnoses:  Sepsis without acute organ dysfunction, due to unspecified organism ([place])  Jerking  Colitis  Syncope, unspecified syncope type    Rx / DC Orders ED Discharge Orders     None         Tyra Gural A, PA-C 06/27/22 2252    Hayden Rasmussen, MD 06/28/22 1043

## 2022-06-27 NOTE — ED Triage Notes (Signed)
BIB husband for shaking. Pt collapsed entering building w/o fall or LOC. Denies pain or injury. Picked up from ground and placed into w/c, taken to triage. Intermittent shaking present. A&Ox4. Slow deliberate thoughtful responses. Fever noted in triage. Denies pain, NVD. Believes she is having a med reaction, to recently prescribed meds.

## 2022-06-27 NOTE — H&P (Signed)
History and Physical    Mary Sanford IEP:[id number] DOB: [birth year] DOA: 06/27/2022  PCP: [name], MD  Patient coming from: home  I have personally briefly reviewed patient's old medical records in [place]  Chief Complaint:  2 days, abdominal pain/diarrhea/ near syncope  HPI: Mary Sanford is a 56 y.o. 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  Patient notes she started having lower right sided back pain and followed up with MD 4 days ago at which time she was given oxycodone and flexeril . Patient states  she has taken these medications in the past w/o difficulty. She states around 2 days ago she began to experience diffuse intermittent abdominal pain with associated diarrhea. She notes that food intake would exacerbation abdominal pain. She states along with this she also noted feeling generally fatigued, as well as lightheaded. She states today her feeling of lightheadedness was severe and she had  2 near syncopal episodes. One on entering ED and the on the other in ED after return from bathroom. Per notes patient has spasm/jerking head movement prior to falling.  Of note on both occasions, episodes where witnessed and patient had no injuries. Patient notes no associated sob/ chest pain / but does not HA+ but no neck pain , no dysuria, no dark stools or blood in stools.  She does endorse sick contact in grandson with uri. She denies eating any tainted foods or recent travel. ED Course:  Tmx 102.4,  bp 178/137, hr 120, sat 97% on ra  Inr 1.1 Na 137, K 3.8, CL: 101, cr 0.98, alkphos :63 Wbc 8.4, hg 12.6, plt 215  Lactic 1.7 Resp panel : Neg Etoh< 10 UA neg CE3 Lipase 32 D-dimer 0.75 EKG:  sinus tachycardia  120 CT L spine 1. No  acute fracture or subluxation. 2. Mild disc bulge at L4-L5 and L5-S1 without significant spinal canal or foraminal stenosis. 3. Mild facet joint arthropathy at L4-L5 and L5-S1. CTH: NAd Cxr IMPRESSION: No radiographic evidence of acute cardiopulmonary process Ctap/abd . No evidence of pulmonary embolus, although bolus timing somewhat limits evaluation of the segmental and subsegmental pulmonary arteries. 2. Mild diffuse colonic wall thickening, finding can be seen in the setting of colitis. 3. Mild wall thickening of the proximal appendix with no periappendiceal fat stranding, likely due to secondary involvement. Correlate for symptoms of right lower quadrant pain. 4. Heterogeneous enhancement of the uterus, likely due to multiple small fibroids fibroids. Finding could be further evaluated with nonemergent pelvic ultrasound. Tx ativan '1mg'$  Cefepime/metronidazole/vanc Review of Systems: As per HPI otherwise 10 point review of systems negative.   Past Medical History:  Diagnosis Date   Allergy    seasonal allergies   Anemia    Anxiety    Arthritis    hands, left knee   COVID    Depression    Family history of adverse reaction to anesthesia    one sister ponv and slow to awaken   History of iron deficiency anemia    History of kidney stones    Hypertension    followed by pcp  (12-05-2019 pt had ETT 12-11-2011 , in epic, showed normal with no ischemia)   Nephrolithiasis    Panic disorder    PMB (postmenopausal bleeding)  Pre-diabetes    followed by pcp   Seasonal allergies    SUI (stress urinary incontinence, female)    Uterine fibroid    Wears glasses     Past Surgical History:  Procedure Laterality Date   BUNIONECTOMY Bilateral    CESAREAN SECTION  1989   CYSTOSCOPY W/ RETROGRADES Left 06/28/2018   Procedure: CYSTOSCOPY WITH RETROGRADE PYELOGRAM LEFT STENT;  Surgeon: Ceasar Mons, MD;  Location: [facility];  Service: Urology;  Laterality: Left;    CYSTOSCOPY/URETEROSCOPY/HOLMIUM LASER/STENT PLACEMENT Left 07/16/2018   Procedure: CYSTOSCOPY/URETEROSCOPY/HOLMIUM LASER/STENT EXCHANGE;  Surgeon: Ceasar Mons, MD;  Location: [facility];  Service: Urology;  Laterality: Left;   DILATATION & CURETTAGE/HYSTEROSCOPY WITH MYOSURE N/A 12/13/2019   Procedure: DILATATION & CURETTAGE/HYSTEROSCOPY WITH MYOSURE;  Surgeon: Salvadore Dom, MD;  Location: [facility] [facility];  Service: Gynecology;  Laterality: N/A;   DILITATION & CURRETTAGE/HYSTROSCOPY WITH NOVASURE ABLATION N/A 06/17/2013   Procedure: DILATATION & CURETTAGE/HYSTEROSCOPY WITH NOVASURE ABLATION;  Surgeon: Osborne Oman, MD;  Location: [facility];  Service: Gynecology;  Laterality: N/A;   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   Procedure: LAPAROSCOPIC ROUX-EN-Y GASTRIC BYPASS WITH UPPER ENDOSCOPY;  Surgeon: Greer Pickerel, MD;  Location: [facility];  Service: General;  Laterality: N/A;   HEMORRHOIDECTOMY WITH HEMORRHOID BANDING  10/19/2012   SCA [facility] Hem ligation/pexy   OPERATIVE ULTRASOUND N/A 12/13/2019   Procedure: OPERATIVE ULTRASOUND;  Surgeon: Salvadore Dom, MD;  Location: [facility];  Service: Gynecology;  Laterality: N/A;   Thumb surgery   04/2021   TUBAL LIGATION Bilateral 2001   UPPER GASTROINTESTINAL ENDOSCOPY  10/11/2020   WISDOM TOOTH EXTRACTION       reports that she has been smoking cigarettes. She has never used smokeless tobacco. She reports current alcohol use of about 10.0 standard drinks of alcohol per week. She reports that she does not use drugs.  Allergies  Allergen Reactions   Naproxen Other (See Comments)    Panic attacks No Problem with Motrin    Family History  Problem Relation Age of Onset   Hypertension Mother    Hypothyroidism Mother    Rectal cancer Mother 1   Colon cancer Mother 10   Colon polyps Mother    Hypotension Sister    Hypothyroidism Sister    Diabetes Sister    Other Sister         pacemaker   Seizures Brother    Hypothyroidism Sister    Seizures Sister    Anemia Sister    Deep vein thrombosis Sister    Seizures Paternal Grandfather    Stroke Maternal Uncle        > 4   Diabetes Maternal Grandmother    Kidney failure Maternal Grandmother    Heart attack Maternal Grandfather    Other Paternal Grandmother        tumors throughout body   Hypertension Maternal Uncle    Esophageal cancer Neg Hx    Stomach cancer Neg Hx    Heart disease Neg Hx     Prior to Admission medications   Medication Sig Start Date End Date Taking? Authorizing Provider  acetaminophen (TYLENOL) 500 MG tablet Take 500-1,000 mg by mouth as needed for headache or moderate pain.   Yes [provider]  albuterol (VENTOLIN HFA) 108 (90 Base) MCG/ACT inhaler Inhale 1-2 puffs into the lungs every 6 (six) hours as needed for wheezing or shortness of breath (for seasonal allergies). Patient taking differently: Inhale 1-2 puffs into  the lungs as needed for wheezing or shortness of breath (for seasonal allergies). 06/15/20  Yes [name], MD  APPLE CIDER VINEGAR PO Take 2 tablets by mouth daily.   Yes [provider]  Cholecalciferol (VITAMIN D3) 25 MCG (1000 UT) CHEW Chew 2 tablets by mouth daily.   Yes [provider]  cyanocobalamin (VITAMIN B12) 1000 MCG tablet Take 1 tablet (1,000 mcg total) by mouth daily. 06/17/22 06/12/23 Yes Alric Ran, MD  cyclobenzaprine (FLEXERIL) 10 MG tablet Take 1 tablet (10 mg total) by mouth 3 (three) times daily as needed for muscle spasms. 06/23/22  Yes Horald Pollen, MD  escitalopram (LEXAPRO) 20 MG tablet Take 1 tablet by mouth once daily 05/18/22  Yes [name], MD  ibuprofen (ADVIL) 200 MG tablet Take 600 mg by mouth every 6 (six) hours as needed for headache, mild pain or moderate pain.   Yes [provider]  nebivolol (BYSTOLIC) 5 MG tablet Take 1 tablet (5 mg total) by mouth daily. 05/28/22  Yes [name], MD  oxyCODONE-acetaminophen (PERCOCET/ROXICET) 5-325 MG tablet Take 1 tablet by mouth every 8 (eight) hours as needed for severe pain. 06/23/22  Yes Sagardia, Ines Bloomer, MD  phentermine 37.5 MG capsule Take 1 capsule (37.5 mg total) by mouth every morning. 05/28/22  Yes [name], MD  triamterene-hydrochlorothiazide [facility]) 37.5-25 MG tablet Take 1 tablet by mouth once daily 06/16/22  Yes [name], MD  potassium chloride (K-DUR) 10 MEQ tablet Take 1 tablet (10 mEq total) by mouth 2 (two) times daily. 05/13/12 06/27/13  [name], MD    Physical Exam: Vitals:   06/27/22 2045 06/27/22 2115 06/27/22 2200 06/27/22 2207  BP: (!) 173/105 (!) 135/112 (!) 154/98   Pulse: (!) 109 (!) 111 (!) 109   Resp: (!) '23 19 20   '$ Temp:    (!) 103.1 F (39.5 C)  TempSrc:    Oral  SpO2: 100% 98% 100%   Weight:      Height:        Constitutional: NAD, calm, comfortable Vitals:   06/27/22 2045 06/27/22 2115 06/27/22 2200 06/27/22 2207  BP: (!) 173/105 (!) 135/112 (!) 154/98   Pulse: (!) 109 (!) 111 (!) 109   Resp: (!) '23 19 20   '$ Temp:    (!) 103.1 F (39.5 C)  TempSrc:    Oral  SpO2: 100% 98% 100%   Weight:      Height:       Eyes: PERRL, lids and conjunctivae normal ENMT: Mucous membranes are moist. Posterior pharynx clear of any exudate or lesions.Normal dentition.  Neck: normal, supple, no masses, no thyromegaly Respiratory: clear to auscultation bilaterally, no wheezing, no crackles. Normal respiratory effort. No accessory muscle use.  Cardiovascular: Regular rate and rhythm, no murmurs / rubs / gallops. No extremity edema. 2+ pedal pulses. No carotid bruits.  Abdomen: + mid abdomen and b/l lower quad tenderness, no masses palpated. No hepatosplenomegaly. Bowel sounds positive.  No rebound , no guarding Musculoskeletal: no clubbing / cyanosis. No joint deformity upper and lower extremities. Good ROM, no contractures. Normal muscle tone.  Skin: no rashes,  lesions, ulcers. No induration Neurologic: CN 2-12 grossly intact. Sensation intact,. Strength 5/5 in all 4.  Psychiatric: Normal judgment and insight. Alert and oriented x 3. Normal mood.    Labs on Admission: I have personally reviewed following labs and imaging studies  CBC: Recent Labs  Lab 06/27/22 1523  WBC 8.4  NEUTROABS 6.6  HGB 12.6  HCT 40.1  MCV 74.5*  PLT 751   Basic Metabolic Panel: Recent Labs  Lab 06/27/22 1523  NA 137  K 3.8  CL 101  CO2 24  GLUCOSE 111*  BUN 13  CREATININE 0.98  CALCIUM 9.5   GFR: Estimated Creatinine Clearance: 77.3 mL/min (by C-G formula based on SCr of 0.98 mg/dL). Liver Function Tests: Recent Labs  Lab 06/27/22 1523  AST 25  ALT 24  ALKPHOS 63  BILITOT 0.4  PROT 6.4*  ALBUMIN 3.4*   Recent Labs  Lab 06/27/22 1554  LIPASE 32   No results for input(s): "AMMONIA" in the last 168 hours. Coagulation Profile: Recent Labs  Lab 06/27/22 1523  INR 1.1   Cardiac Enzymes: No results for input(s): "CKTOTAL", "CKMB", "CKMBINDEX", "TROPONINI" in the last 168 hours. BNP (last 3 results) No results for input(s): "PROBNP" in the last 8760 hours. HbA1C: No results for input(s): "HGBA1C" in the last 72 hours. CBG: No results for input(s): "GLUCAP" in the last 168 hours. Lipid Profile: No results for input(s): "CHOL", "HDL", "LDLCALC", "TRIG", "CHOLHDL", "LDLDIRECT" in the last 72 hours. Thyroid Function Tests: Recent Labs    06/27/22 1523  FREET4 0.68   Anemia Panel: No results for input(s): "VITAMINB12", "FOLATE", "FERRITIN", "TIBC", "IRON", "RETICCTPCT" in the last 72 hours. Urine analysis:    Component Value Date/Time   COLORURINE STRAW (A) 06/27/2022 1947   APPEARANCEUR CLEAR 06/27/2022 1947   LABSPEC 1.019 06/27/2022 1947   PHURINE 5.0 06/27/2022 1947   GLUCOSEU NEGATIVE 06/27/2022 1947   GLUCOSEU NEGATIVE 05/05/2022 Prien 06/27/2022 1947   BILIRUBINUR NEGATIVE 06/27/2022 1947   KETONESUR  NEGATIVE 06/27/2022 1947   PROTEINUR NEGATIVE 06/27/2022 1947   UROBILINOGEN 0.2 05/05/2022 1510   NITRITE NEGATIVE 06/27/2022 1947   LEUKOCYTESUR NEGATIVE 06/27/2022 1947    Radiological Exams on Admission: CT Angio Chest PE W and/or Wo Contrast  Result Date: 06/27/2022 CLINICAL DATA:  Sepsis EXAM: CT ANGIOGRAPHY CHEST CT ABDOMEN AND PELVIS WITH CONTRAST TECHNIQUE: Multidetector CT imaging of the chest was performed using the standard protocol during bolus administration of intravenous contrast. Multiplanar CT image reconstructions and MIPs were obtained to evaluate the vascular anatomy. Multidetector CT imaging of the abdomen and pelvis was performed using the standard protocol during bolus administration of intravenous contrast. RADIATION DOSE REDUCTION: This exam was performed according to the departmental dose-optimization program which includes automated exposure control, adjustment of the mA and/or kV according to patient size and/or use of iterative reconstruction technique. CONTRAST:  120m OMNIPAQUE IOHEXOL 350 MG/ML SOLN COMPARISON:  None Available. FINDINGS: CTA CHEST FINDINGS Cardiovascular: No evidence of pulmonary embolus, although bolus timing somewhat limits evaluation of the segmental and subsegmental pulmonary arteries. Normal heart size. No pericardial effusion. Normal caliber thoracic aorta with no atherosclerotic disease. Mediastinum/Nodes: Small hiatal hernia. Thyroid is unremarkable. No pathologically enlarged lymph nodes seen in the chest Lungs/Pleura: Central airways are patent. Mild bibasilar atelectasis no consolidation, pleural effusion or pneumothorax Musculoskeletal: No chest wall abnormality. No acute or significant osseous findings. Review of the MIP images confirms the above findings. CT ABDOMEN and PELVIS FINDINGS Hepatobiliary: No focal liver abnormality is seen. No gallstones, gallbladder wall thickening, or biliary dilatation. Pancreas: Unremarkable. No pancreatic  ductal dilatation or surrounding inflammatory changes. Spleen: Normal in size without focal abnormality. Adrenals/Urinary Tract: Bilateral adrenal glands are unremarkable. No hydronephrosis or nephrolithiasis. Bladder is unremarkable. Stomach/Bowel: Postsurgical changes of the stomach. Mild diffuse colonic wall thickening. Mild wall  thickening of the proximal appendix with no periappendiceal fat stranding, appendix measures up to 10 mm in diameter. No evidence of obstruction Vascular/Lymphatic: No significant vascular findings are present. No enlarged abdominal or pelvic lymph nodes. Reproductive: Heterogeneous enhancement of the uterus. No adnexal masses. Other: No abdominal wall hernia or abnormality. No abdominopelvic ascites. Musculoskeletal: No acute or significant osseous findings. Review of the MIP images confirms the above findings. IMPRESSION: 1. No evidence of pulmonary embolus, although bolus timing somewhat limits evaluation of the segmental and subsegmental pulmonary arteries. 2. Mild diffuse colonic wall thickening, finding can be seen in the setting of colitis. 3. Mild wall thickening of the proximal appendix with no periappendiceal fat stranding, likely due to secondary involvement. Correlate for symptoms of right lower quadrant pain. 4. Heterogeneous enhancement of the uterus, likely due to multiple small fibroids fibroids. Finding could be further evaluated with nonemergent pelvic ultrasound. Electronically Signed   By: Yetta Glassman M.D.   On: 06/27/2022 21:00   CT ABDOMEN PELVIS W CONTRAST  Result Date: 06/27/2022 CLINICAL DATA:  Sepsis EXAM: CT ANGIOGRAPHY CHEST CT ABDOMEN AND PELVIS WITH CONTRAST TECHNIQUE: Multidetector CT imaging of the chest was performed using the standard protocol during bolus administration of intravenous contrast. Multiplanar CT image reconstructions and MIPs were obtained to evaluate the vascular anatomy. Multidetector CT imaging of the abdomen and pelvis was  performed using the standard protocol during bolus administration of intravenous contrast. RADIATION DOSE REDUCTION: This exam was performed according to the departmental dose-optimization program which includes automated exposure control, adjustment of the mA and/or kV according to patient size and/or use of iterative reconstruction technique. CONTRAST:  164m OMNIPAQUE IOHEXOL 350 MG/ML SOLN COMPARISON:  None Available. FINDINGS: CTA CHEST FINDINGS Cardiovascular: No evidence of pulmonary embolus, although bolus timing somewhat limits evaluation of the segmental and subsegmental pulmonary arteries. Normal heart size. No pericardial effusion. Normal caliber thoracic aorta with no atherosclerotic disease. Mediastinum/Nodes: Small hiatal hernia. Thyroid is unremarkable. No pathologically enlarged lymph nodes seen in the chest Lungs/Pleura: Central airways are patent. Mild bibasilar atelectasis no consolidation, pleural effusion or pneumothorax Musculoskeletal: No chest wall abnormality. No acute or significant osseous findings. Review of the MIP images confirms the above findings. CT ABDOMEN and PELVIS FINDINGS Hepatobiliary: No focal liver abnormality is seen. No gallstones, gallbladder wall thickening, or biliary dilatation. Pancreas: Unremarkable. No pancreatic ductal dilatation or surrounding inflammatory changes. Spleen: Normal in size without focal abnormality. Adrenals/Urinary Tract: Bilateral adrenal glands are unremarkable. No hydronephrosis or nephrolithiasis. Bladder is unremarkable. Stomach/Bowel: Postsurgical changes of the stomach. Mild diffuse colonic wall thickening. Mild wall thickening of the proximal appendix with no periappendiceal fat stranding, appendix measures up to 10 mm in diameter. No evidence of obstruction Vascular/Lymphatic: No significant vascular findings are present. No enlarged abdominal or pelvic lymph nodes. Reproductive: Heterogeneous enhancement of the uterus. No adnexal masses.  Other: No abdominal wall hernia or abnormality. No abdominopelvic ascites. Musculoskeletal: No acute or significant osseous findings. Review of the MIP images confirms the above findings. IMPRESSION: 1. No evidence of pulmonary embolus, although bolus timing somewhat limits evaluation of the segmental and subsegmental pulmonary arteries. 2. Mild diffuse colonic wall thickening, finding can be seen in the setting of colitis. 3. Mild wall thickening of the proximal appendix with no periappendiceal fat stranding, likely due to secondary involvement. Correlate for symptoms of right lower quadrant pain. 4. Heterogeneous enhancement of the uterus, likely due to multiple small fibroids fibroids. Finding could be further evaluated with nonemergent pelvic ultrasound. Electronically Signed  By: Yetta Glassman M.D.   On: 06/27/2022 21:00   CT L-SPINE NO CHARGE  Result Date: 06/27/2022 CLINICAL DATA:  Patient complains of shaking.  Feeling fatigued. EXAM: CT LUMBAR SPINE WITHOUT CONTRAST TECHNIQUE: Multidetector CT imaging of the lumbar spine was performed without intravenous contrast administration. Multiplanar CT image reconstructions were also generated. RADIATION DOSE REDUCTION: This exam was performed according to the departmental dose-optimization program which includes automated exposure control, adjustment of the mA and/or kV according to patient size and/or use of iterative reconstruction technique. COMPARISON:  None Available. FINDINGS: Segmentation: 5 lumbar type vertebrae. Alignment: Normal. Vertebrae: No acute fracture or focal pathologic process. Paraspinal and other soft tissues: Negative. Disc levels: Mild disc bulge at L4-L5 at L5-S1 without significant spinal canal or foraminal stenosis. Mild facet joint arthropathy at L4-L5 and L5-S1. IMPRESSION: 1. No acute fracture or subluxation. 2. Mild disc bulge at L4-L5 and L5-S1 without significant spinal canal or foraminal stenosis. 3. Mild facet joint  arthropathy at L4-L5 and L5-S1. Electronically Signed   By: Keane Police D.O.   On: 06/27/2022 20:46   CT HEAD WO CONTRAST (5MM)  Result Date: 06/27/2022 CLINICAL DATA:  Fatigue. EXAM: CT HEAD WITHOUT CONTRAST TECHNIQUE: Contiguous axial images were obtained from the base of the skull through the vertex without intravenous contrast. RADIATION DOSE REDUCTION: This exam was performed according to the departmental dose-optimization program which includes automated exposure control, adjustment of the mA and/or kV according to patient size and/or use of iterative reconstruction technique. COMPARISON:  August 25, 2018 FINDINGS: Brain: No evidence of acute infarction, hemorrhage, hydrocephalus, extra-axial collection or mass lesion/mass effect. Vascular: No hyperdense vessel or unexpected calcification. Skull: Normal. Negative for fracture or focal lesion. Sinuses/Orbits: No acute finding. Other: None. IMPRESSION: No acute intracranial pathology. Electronically Signed   By: Virgina Norfolk M.D.   On: 06/27/2022 20:40   DG Chest Portable 1 View  Result Date: 06/27/2022 CLINICAL DATA:  Fever, sepsis EXAM: PORTABLE CHEST 1 VIEW COMPARISON:  Chest radiograph 06/23/2021 FINDINGS: The cardiomediastinal silhouette is normal. There is no focal consolidation or pulmonary edema. There is no pleural effusion or pneumothorax There is no acute osseous abnormality. IMPRESSION: No radiographic evidence of acute cardiopulmonary process. Electronically Signed   By: Valetta Mole M.D.   On: 06/27/2022 17:33    EKG: Independently reviewed. See above  Assessment/Plan  Acute colitis with associated sepsis  -fever/tachycardia/ infection /rr>20 -continue with cefepime/metronidazole -f/u with gi panel  -clear liquids -supportive care - advance diet as tolerated   Near Syncope -insetting of infection , possible due to orthostasis  -ivfs over night  - CTPA neg, CTH neg , neuro exam non-focal   Myoclonus syndrome nos   -? Related to med side-effect, infection  -check inflammatory markers , hold any non-essential medications -neurology consulted by ED -will await final rec regarding further evaluation - monitor electrolytes , check magnesium    Essential hypertension  -uncontrolled in ED -resume home regimen as able   Anxiety /Depression -resume ssri   CLBP -recent flare  -supportive care lidocaine patch    ETOH -continue to have increase wine intake , one bottle per day  -will place on ciwa -mvi DVT prophylaxis: heparin Code Status: full Family Communication:   Rosezetta Schlatter (Daughter) [phone] (Home Phone)   Disposition Plan: patient  expected to be admitted greater than 2 midnights  Consults called: .neurology Admission status: progressive care   Clance Boll MD [facility]   If 7PM-7AM, please contact night-coverage [email or web address]  Password [email or web address]  06/27/2022, 10:39 PM

## 2022-06-28 ENCOUNTER — Encounter (HOSPITAL_COMMUNITY): Payer: Self-pay

## 2022-06-28 DIAGNOSIS — E861 Hypovolemia: Secondary | ICD-10-CM | POA: Diagnosis present

## 2022-06-28 DIAGNOSIS — G8929 Other chronic pain: Secondary | ICD-10-CM | POA: Diagnosis present

## 2022-06-28 DIAGNOSIS — G253 Myoclonus: Secondary | ICD-10-CM | POA: Diagnosis not present

## 2022-06-28 DIAGNOSIS — K529 Noninfective gastroenteritis and colitis, unspecified: Secondary | ICD-10-CM | POA: Diagnosis not present

## 2022-06-28 DIAGNOSIS — A045 Campylobacter enteritis: Secondary | ICD-10-CM | POA: Diagnosis not present

## 2022-06-28 DIAGNOSIS — Z8249 Family history of ischemic heart disease and other diseases of the circulatory system: Secondary | ICD-10-CM | POA: Diagnosis not present

## 2022-06-28 DIAGNOSIS — Z83719 Family history of colon polyps, unspecified: Secondary | ICD-10-CM | POA: Diagnosis not present

## 2022-06-28 DIAGNOSIS — F17210 Nicotine dependence, cigarettes, uncomplicated: Secondary | ICD-10-CM | POA: Diagnosis present

## 2022-06-28 DIAGNOSIS — R55 Syncope and collapse: Secondary | ICD-10-CM | POA: Diagnosis not present

## 2022-06-28 DIAGNOSIS — F1010 Alcohol abuse, uncomplicated: Secondary | ICD-10-CM | POA: Diagnosis present

## 2022-06-28 DIAGNOSIS — Z886 Allergy status to analgesic agent status: Secondary | ICD-10-CM | POA: Diagnosis not present

## 2022-06-28 DIAGNOSIS — D259 Leiomyoma of uterus, unspecified: Secondary | ICD-10-CM | POA: Diagnosis present

## 2022-06-28 DIAGNOSIS — Z823 Family history of stroke: Secondary | ICD-10-CM | POA: Diagnosis not present

## 2022-06-28 DIAGNOSIS — Z79899 Other long term (current) drug therapy: Secondary | ICD-10-CM | POA: Diagnosis not present

## 2022-06-28 DIAGNOSIS — Z833 Family history of diabetes mellitus: Secondary | ICD-10-CM | POA: Diagnosis not present

## 2022-06-28 DIAGNOSIS — R7303 Prediabetes: Secondary | ICD-10-CM | POA: Diagnosis present

## 2022-06-28 DIAGNOSIS — F32A Depression, unspecified: Secondary | ICD-10-CM | POA: Diagnosis present

## 2022-06-28 DIAGNOSIS — R569 Unspecified convulsions: Secondary | ICD-10-CM | POA: Diagnosis not present

## 2022-06-28 DIAGNOSIS — I10 Essential (primary) hypertension: Secondary | ICD-10-CM

## 2022-06-28 DIAGNOSIS — M62838 Other muscle spasm: Secondary | ICD-10-CM | POA: Diagnosis not present

## 2022-06-28 DIAGNOSIS — R413 Other amnesia: Secondary | ICD-10-CM | POA: Diagnosis present

## 2022-06-28 DIAGNOSIS — Z9884 Bariatric surgery status: Secondary | ICD-10-CM | POA: Diagnosis not present

## 2022-06-28 DIAGNOSIS — S39012A Strain of muscle, fascia and tendon of lower back, initial encounter: Secondary | ICD-10-CM | POA: Diagnosis not present

## 2022-06-28 DIAGNOSIS — F419 Anxiety disorder, unspecified: Secondary | ICD-10-CM | POA: Diagnosis present

## 2022-06-28 DIAGNOSIS — Z8616 Personal history of COVID-19: Secondary | ICD-10-CM | POA: Diagnosis not present

## 2022-06-28 DIAGNOSIS — A4159 Other Gram-negative sepsis: Secondary | ICD-10-CM | POA: Diagnosis present

## 2022-06-28 DIAGNOSIS — Z800 Family history of malignant neoplasm of digestive organs: Secondary | ICD-10-CM | POA: Diagnosis not present

## 2022-06-28 DIAGNOSIS — E876 Hypokalemia: Secondary | ICD-10-CM | POA: Diagnosis not present

## 2022-06-28 DIAGNOSIS — D649 Anemia, unspecified: Secondary | ICD-10-CM | POA: Diagnosis present

## 2022-06-28 LAB — CBC
HCT: 36.1 % (ref 36.0–46.0)
HCT: 34.4 % — ABNORMAL LOW (ref 36.0–46.0)
Hemoglobin: 11.6 g/dL — ABNORMAL LOW (ref 12.0–15.0)
Hemoglobin: 11 g/dL — ABNORMAL LOW (ref 12.0–15.0)
MCH: 24.1 pg — ABNORMAL LOW (ref 26.0–34.0)
MCH: 23.7 pg — ABNORMAL LOW (ref 26.0–34.0)
MCHC: 32.1 g/dL (ref 30.0–36.0)
MCHC: 32 g/dL (ref 30.0–36.0)
MCV: 74.9 fL — ABNORMAL LOW (ref 80.0–100.0)
MCV: 74.1 fL — ABNORMAL LOW (ref 80.0–100.0)
Platelets: 169 10*3/uL (ref 150–400)
Platelets: 187 10*3/uL (ref 150–400)
RBC: 4.82 MIL/uL (ref 3.87–5.11)
RBC: 4.64 MIL/uL (ref 3.87–5.11)
RDW: 14.3 % (ref 11.5–15.5)
RDW: 14.3 % (ref 11.5–15.5)
WBC: 6.4 10*3/uL (ref 4.0–10.5)
WBC: 6.7 10*3/uL (ref 4.0–10.5)
nRBC: 0 % (ref 0.0–0.2)
nRBC: 0 % (ref 0.0–0.2)

## 2022-06-28 LAB — COMPREHENSIVE METABOLIC PANEL
ALT: 20 U/L (ref 0–44)
AST: 21 U/L (ref 15–41)
Albumin: 2.7 g/dL — ABNORMAL LOW (ref 3.5–5.0)
Alkaline Phosphatase: 54 U/L (ref 38–126)
Anion gap: 10 (ref 5–15)
BUN: 8 mg/dL (ref 6–20)
CO2: 24 mmol/L (ref 22–32)
Calcium: 9.1 mg/dL (ref 8.9–10.3)
Chloride: 103 mmol/L (ref 98–111)
Creatinine, Ser: 0.8 mg/dL (ref 0.44–1.00)
GFR, Estimated: >60 mL/min (ref >60)
Glucose, Bld: 117 mg/dL — ABNORMAL HIGH (ref 70–99)
Potassium: 3.5 mmol/L (ref 3.5–5.1)
Sodium: 137 mmol/L (ref 135–145)
Total Bilirubin: 0.3 mg/dL (ref 0.3–1.2)
Total Protein: 5.3 g/dL — ABNORMAL LOW (ref 6.5–8.1)

## 2022-06-28 LAB — C DIFFICILE QUICK SCREEN W PCR REFLEX
C Diff antigen: NEGATIVE
C Diff interpretation: NOT DETECTED
C Diff toxin: NEGATIVE

## 2022-06-28 LAB — AMMONIA: Ammonia: 27 umol/L (ref 9–35)

## 2022-06-28 LAB — CREATININE, SERUM
Creatinine, Ser: 0.95 mg/dL (ref 0.44–1.00)
GFR, Estimated: >60 mL/min (ref >60)

## 2022-06-28 LAB — PROCALCITONIN: Procalcitonin: <0.1 ng/mL

## 2022-06-28 LAB — HIV ANTIBODY (ROUTINE TESTING W REFLEX): HIV Screen 4th Generation wRfx: NONREACTIVE

## 2022-06-28 LAB — C-REACTIVE PROTEIN: CRP: 8.9 mg/dL — ABNORMAL HIGH (ref <1.0)

## 2022-06-28 LAB — FERRITIN: Ferritin: 104 ng/mL (ref 11–307)

## 2022-06-28 LAB — TSH: TSH: 0.682 u[IU]/mL (ref 0.350–4.500)

## 2022-06-28 MED ORDER — POTASSIUM CHLORIDE IN NACL 20-0.9 MEQ/L-% IV SOLN
INTRAVENOUS | Status: AC
  Filled 2022-06-28 (×3): qty 1000

## 2022-06-28 MED ORDER — LIDOCAINE 5 % EX PTCH: 1.0000 | MEDICATED_PATCH | CUTANEOUS | Status: DC

## 2022-06-28 MED ORDER — ACETAMINOPHEN 325 MG PO TABS
650.0000 mg | ORAL_TABLET | ORAL | Status: DC | PRN
  Administered 2022-06-29 – 2022-06-30 (×4): 650 mg via ORAL
  Filled 2022-06-28 (×4): qty 2

## 2022-06-28 MED ORDER — VITAMIN B-12 1000 MCG PO TABS
1000.0000 ug | ORAL_TABLET | Freq: Every day | ORAL | Status: DC
  Administered 2022-06-28 – 2022-07-01 (×4): 1000 ug via ORAL
  Filled 2022-06-28 (×4): qty 1

## 2022-06-28 MED ORDER — FOLIC ACID 1 MG PO TABS
1.0000 mg | ORAL_TABLET | Freq: Every day | ORAL | Status: DC
  Administered 2022-06-28 – 2022-07-01 (×4): 1 mg via ORAL
  Filled 2022-06-28 (×4): qty 1

## 2022-06-28 MED ORDER — ESCITALOPRAM OXALATE 20 MG PO TABS
20.0000 mg | ORAL_TABLET | Freq: Every day | ORAL | Status: DC
  Administered 2022-06-28 – 2022-07-01 (×4): 20 mg via ORAL
  Filled 2022-06-28: qty 2
  Filled 2022-06-28 (×3): qty 1

## 2022-06-28 MED ORDER — THIAMINE HCL 100 MG/ML IJ SOLN: 100.0000 mg | Freq: Every day | INTRAMUSCULAR | Status: DC

## 2022-06-28 MED ORDER — HEPARIN SODIUM (PORCINE) 5000 UNIT/ML IJ SOLN
5000.0000 [IU] | Freq: Three times a day (TID) | INTRAMUSCULAR | Status: DC
  Administered 2022-06-28 – 2022-07-01 (×10): 5000 [IU] via SUBCUTANEOUS
  Filled 2022-06-28 (×10): qty 1

## 2022-06-28 MED ORDER — ALBUTEROL SULFATE (2.5 MG/3ML) 0.083% IN NEBU: 2.5000 mg | INHALATION_SOLUTION | RESPIRATORY_TRACT | Status: DC | PRN

## 2022-06-28 MED ORDER — LORAZEPAM 2 MG/ML IJ SOLN: 1.0000 mg | INTRAMUSCULAR | Status: AC | PRN

## 2022-06-28 MED ORDER — CIPROFLOXACIN IN D5W 400 MG/200ML IV SOLN: 400.0000 mg | Freq: Two times a day (BID) | INTRAVENOUS | Status: DC

## 2022-06-28 MED ORDER — MORPHINE SULFATE (PF) 2 MG/ML IV SOLN
2.0000 mg | INTRAVENOUS | Status: DC | PRN
  Administered 2022-06-28 – 2022-06-29 (×2): 2 mg via INTRAVENOUS
  Filled 2022-06-28 (×2): qty 1

## 2022-06-28 MED ORDER — LORAZEPAM 1 MG PO TABS: 1.0000 mg | ORAL_TABLET | ORAL | Status: AC | PRN

## 2022-06-28 MED ORDER — ALBUTEROL SULFATE HFA 108 (90 BASE) MCG/ACT IN AERS: 1.0000 | INHALATION_SPRAY | RESPIRATORY_TRACT | Status: DC | PRN

## 2022-06-28 MED ORDER — LIDOCAINE 5 % EX PTCH
1.0000 | MEDICATED_PATCH | CUTANEOUS | Status: DC
  Administered 2022-06-29 – 2022-07-01 (×3): 1 via TRANSDERMAL
  Filled 2022-06-28 (×4): qty 1

## 2022-06-28 MED ORDER — SODIUM CHLORIDE 0.9 % IV SOLN
2.0000 g | INTRAVENOUS | Status: DC
  Administered 2022-06-28 – 2022-06-29 (×2): 2 g via INTRAVENOUS
  Filled 2022-06-28 (×2): qty 20

## 2022-06-28 MED ORDER — LACTATED RINGERS IV SOLN: INTRAVENOUS | Status: DC

## 2022-06-28 MED ORDER — OXYCODONE HCL 5 MG PO TABS
5.0000 mg | ORAL_TABLET | ORAL | Status: DC | PRN
  Administered 2022-06-28 (×2): 5 mg via ORAL
  Filled 2022-06-28 (×2): qty 1

## 2022-06-28 MED ORDER — NEBIVOLOL HCL 5 MG PO TABS
5.0000 mg | ORAL_TABLET | Freq: Every day | ORAL | Status: DC
  Administered 2022-06-28 – 2022-07-01 (×4): 5 mg via ORAL
  Filled 2022-06-28 (×4): qty 1

## 2022-06-28 MED ORDER — THIAMINE MONONITRATE 100 MG PO TABS
100.0000 mg | ORAL_TABLET | Freq: Every day | ORAL | Status: DC
  Administered 2022-06-28 – 2022-07-01 (×4): 100 mg via ORAL
  Filled 2022-06-28 (×3): qty 1

## 2022-06-28 MED ORDER — METRONIDAZOLE 500 MG/100ML IV SOLN
500.0000 mg | Freq: Two times a day (BID) | INTRAVENOUS | Status: DC
  Administered 2022-06-28 – 2022-06-29 (×3): 500 mg via INTRAVENOUS
  Filled 2022-06-28 (×3): qty 100

## 2022-06-28 MED ORDER — ADULT MULTIVITAMIN W/MINERALS CH
1.0000 | ORAL_TABLET | Freq: Every day | ORAL | Status: DC
  Administered 2022-06-28 – 2022-07-01 (×4): 1 via ORAL
  Filled 2022-06-28 (×4): qty 1

## 2022-06-28 NOTE — Progress Notes (Signed)
[facility] PROGRESS NOTE   Mary Sanford KNL:[id number] DOB: 11/20/1965 DOA: 06/27/2022  PCP: [name], MD  Brief History/Interval Summary: 56 y.o. 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  Patient notes she started having lower right sided back pain and followed up with MD 4 days ago at which time she was given oxycodone and flexeril. Patient states  she has taken these medications in the past w/o difficulty. She states around 2 days prior to admission she began to experience diffuse intermittent abdominal pain with associated diarrhea.  Along with the syncopal episode she was also noted to have jerking movements of her head.  There was some concern for seizure activity.  Patient was hospitalized for further management.    Consultants: Neurology  Procedures: None yet    Subjective/Interval History: Patient continues to have loose stool and abdominal pain.  Denies any nausea vomiting.  No further jerking movements have been noted.  Husband is at the bedside.    Assessment/Plan:  Acute colitis with sepsis present on admission Patient was noted to have fever tachycardia and evidence for colitis on CT scan.  She mentioned having abdominal pain and diarrhea for about 3 days prior to admission.  Unclear if she has had any recent antibiotic use. CT of the abdomen also showed abnormal appearing appendix.  However on examination she is not tender in the right lower quadrant.  Appendicitis seems to be less likely. Stool studies will be ordered including GI pathogen panel and C. difficile. Continue with vancomycin and ceftriaxone and metronidazole for now.  Follow-up on blood cultures.  Continue with IV fluids.  Near syncope Likely due  to hypovolemia.  Monitor on telemetry for now.  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  They recommend holding Flexeril and opioids.  EEG has not been recommended.  Ammonia level was noted to be normal.  Continue to monitor.  Essential hypertension Blood pressures were quite high when she presented to the hospital.  Seems to be better over the last 12 hours.  Continue to monitor.  Noted to be on Bystolic.  History of anxiety and depression Stable.  Continue Lexapro.  Chronic back pain Lidocaine patch.  History of alcohol abuse  Apparently drinks 1 bottle of wine on a daily basis.  Placed on CIWA protocol.  Normocytic anemia No evidence of overt blood loss.  Continue to monitor.  Uterine fibroids Incidentally noted on CT scan.  Outpatient follow-up.   DVT Prophylaxis: Subcutaneous heparin Code Status: Full code Family Communication: Discussed with the patient and her husband Disposition Plan: Hopefully return home when improved  Status is: Inpatient Remains inpatient appropriate because: Acute colitis      Medications: Scheduled:  cyanocobalamin  1,000 mcg Oral Daily   escitalopram  20 mg Oral Daily   heparin  5,000 Units Subcutaneous Q8H   lidocaine  1 patch Transdermal Q24H   nebivolol  5 mg Oral Daily   Continuous:  cefTRIAXone (ROCEPHIN)  IV 2 g (06/28/22 0835)   lactated ringers 150 mL/hr at 06/27/22 1651   metronidazole Stopped (06/28/22 0740)   vancomycin Stopped (06/28/22 7902)   IOX:[id number], albuterol, morphine injection, oxyCODONE  Antibiotics: Anti-infectives (From admission, onward)    Start     Dose/Rate Route Frequency Ordered Stop  06/28/22 0800  cefTRIAXone (ROCEPHIN) 2 g in sodium chloride 0.9 % 100 mL IVPB        2 g 200 mL/hr over 30 Minutes Intravenous Every 24 hours 06/28/22 0641     06/28/22 0645  ciprofloxacin (CIPRO) IVPB 400 mg  Status:  Discontinued        400 mg 200 mL/hr over 60 Minutes Intravenous  Every 12 hours 06/28/22 0630 06/28/22 0631   06/28/22 0645  ciprofloxacin (CIPRO) IVPB 400 mg  Status:  Discontinued        400 mg 200 mL/hr over 60 Minutes Intravenous Every 12 hours 06/28/22 0631 06/28/22 0641   06/28/22 0400  vancomycin (VANCOREADY) IVPB 1250 mg/250 mL        1,250 mg 166.7 mL/hr over 90 Minutes Intravenous Every 12 hours 06/27/22 1722     06/28/22 0400  metroNIDAZOLE (FLAGYL) IVPB 500 mg        500 mg 100 mL/hr over 60 Minutes Intravenous Every 12 hours 06/28/22 0046     06/28/22 0000  ceFEPIme (MAXIPIME) 2 g in sodium chloride 0.9 % 100 mL IVPB  Status:  Discontinued        2 g 200 mL/hr over 30 Minutes Intravenous Every 8 hours 06/27/22 1722 06/28/22 0631   06/27/22 1600  ceFEPIme (MAXIPIME) 2 g in sodium chloride 0.9 % 100 mL IVPB        2 g 200 mL/hr over 30 Minutes Intravenous  Once 06/27/22 1551 06/27/22 1641   06/27/22 1600  metroNIDAZOLE (FLAGYL) IVPB 500 mg        500 mg 100 mL/hr over 60 Minutes Intravenous  Once 06/27/22 1551 06/27/22 1825   06/27/22 1600  vancomycin (VANCOCIN) IVPB 1000 mg/200 mL premix  Status:  Discontinued        1,000 mg 200 mL/hr over 60 Minutes Intravenous  Once 06/27/22 1551 06/27/22 1553   06/27/22 1600  vancomycin (VANCOREADY) IVPB 1750 mg/350 mL        1,750 mg 175 mL/hr over 120 Minutes Intravenous  Once 06/27/22 1553 06/27/22 2124       Objective:  Vital Signs  Vitals:   06/28/22 0430 06/28/22 0715 06/28/22 0730 06/28/22 0744  BP: (!) 136/95 (!) 134/91 (!) 146/94   Pulse: (!) 103 (!) 110 (!) 108   Resp: (!) 21 (!) 26 16   Temp:    (!) 100.4 F (38 C)  TempSrc:    Oral  SpO2: 96% 98% 95%   Weight:      Height:       No intake or output data in the 24 hours ending 06/28/22 0856 Filed Weights   06/27/22 1522  Weight: 89.4 kg    General appearance: Awake alert.  In no distress.  Mildly distracted. Resp: Clear to auscultation bilaterally.  Normal effort Cardio: S1-S2 is normal regular.  No S3-S4.  No rubs  murmurs or bruit GI: Abdomen is soft.  Tender in the mid abdomen.  No tenderness in the McBurney's point.  Bowel sounds sluggish but present.  No masses organomegaly. Extremities: No edema.  Full range of motion of lower extremities. Neurologic:  No focal neurological deficits.    Lab Results:  Data Reviewed: I have personally reviewed following labs and reports of the imaging studies  CBC: Recent Labs  Lab 06/27/22 1523 06/28/22 0133 06/28/22 0408  WBC 8.4 6.4 6.7  NEUTROABS 6.6  --   --   HGB 12.6 11.6* 11.0*  HCT 40.1 36.1 34.4*  MCV 74.5* 74.9* 74.1*  PLT 215 169 341    Basic Metabolic Panel: Recent Labs  Lab 06/27/22 1523 06/28/22 0133 06/28/22 0408  NA 137  --  137  K 3.8  --  3.5  CL 101  --  103  CO2 24  --  24  GLUCOSE 111*  --  117*  BUN 13  --  8  CREATININE 0.98 0.95 0.80  CALCIUM 9.5  --  9.1    GFR: Estimated Creatinine Clearance: 94.7 mL/min (by C-G formula based on SCr of 0.8 mg/dL).  Liver Function Tests: Recent Labs  Lab 06/27/22 1523 06/28/22 0408  AST 25 21  ALT 24 20  ALKPHOS 63 54  BILITOT 0.4 0.3  PROT 6.4* 5.3*  ALBUMIN 3.4* 2.7*    Recent Labs  Lab 06/27/22 1554  LIPASE 32   Recent Labs  Lab 06/28/22 0133  AMMONIA 27    Coagulation Profile: Recent Labs  Lab 06/27/22 1523  INR 1.1    Thyroid Function Tests: Recent Labs    06/27/22 1523 06/28/22 0133  TSH  --  0.682  FREET4 0.68  --     Anemia Panel: Recent Labs    06/28/22 0133  FERRITIN 104    Recent Results (from the past 240 hour(s))  Blood Culture (routine x 2)     Status: None (Preliminary result)   Collection Time: 06/27/22  3:50 PM   Specimen: BLOOD  Result Value Ref Range Status   Specimen Description BLOOD RIGHT ANTECUBITAL  Final   Special Requests   Final    BOTTLES DRAWN AEROBIC AND ANAEROBIC Blood Culture results may not be optimal due to an excessive volume of blood received in culture bottles   Culture   Final    NO GROWTH < 24  HOURS Performed at [facility], [place] [address], [place], [place] [address]    Report Status PENDING  Incomplete  Blood Culture (routine x 2)     Status: None (Preliminary result)   Collection Time: 06/27/22  3:51 PM   Specimen: BLOOD  Result Value Ref Range Status   Specimen Description BLOOD LEFT ANTECUBITAL  Final   Special Requests   Final    BOTTLES DRAWN AEROBIC AND ANAEROBIC Blood Culture adequate volume   Culture   Final    NO GROWTH < 24 HOURS Performed at [facility], [place] [address] [place], [place] [address]    Report Status PENDING  Incomplete  Resp Panel by RT-PCR (Flu A&B, Covid) Anterior Nasal Swab     Status: None   Collection Time: 06/27/22  3:51 PM   Specimen: Anterior Nasal Swab  Result Value Ref Range Status   SARS Coronavirus 2 by RT PCR NEGATIVE NEGATIVE Final    Comment: (NOTE) SARS-CoV-2 target nucleic acids are NOT DETECTED.  The SARS-CoV-2 RNA is generally detectable in upper respiratory specimens during the acute phase of infection. The lowest concentration of SARS-CoV-2 viral copies this assay can detect is 138 copies/mL. A negative result does not preclude SARS-Cov-2 infection and should not be used as the sole basis for treatment or other patient management decisions. A negative result may occur with  improper specimen collection/handling, submission of specimen other than nasopharyngeal swab, presence of viral mutation(s) within the areas targeted by this assay, and inadequate number of viral copies(<138 copies/mL). A negative result must be combined with clinical observations, patient history, and epidemiological information. The expected result is Negative.  Fact Sheet for  Patients:  EntrepreneurPulse.com.au  Fact Sheet for Healthcare Providers:  IncredibleEmployment.be  This test is no t yet approved or cleared by the [place] FDA and  has been authorized for detection and/or diagnosis  of SARS-CoV-2 by FDA under an Emergency Use Authorization (EUA). This EUA will remain  in effect (meaning this test can be used) for the duration of the COVID-19 declaration under Section 564(b)(1) of the Act, 21 U.S.C.section 360bbb-3(b)(1), unless the authorization is terminated  or revoked sooner.       Influenza A by PCR NEGATIVE NEGATIVE Final   Influenza B by PCR NEGATIVE NEGATIVE Final    Comment: (NOTE) The Xpert Xpress SARS-CoV-2/FLU/RSV plus assay is intended as an aid in the diagnosis of influenza from Nasopharyngeal swab specimens and should not be used as a sole basis for treatment. Nasal washings and aspirates are unacceptable for Xpert Xpress SARS-CoV-2/FLU/RSV testing.  Fact Sheet for Patients: EntrepreneurPulse.com.au  Fact Sheet for Healthcare Providers: IncredibleEmployment.be  This test is not yet approved or cleared by the [place] FDA and has been authorized for detection and/or diagnosis of SARS-CoV-2 by FDA under an Emergency Use Authorization (EUA). This EUA will remain in effect (meaning this test can be used) for the duration of the COVID-19 declaration under Section 564(b)(1) of the Act, 21 U.S.C. section 360bbb-3(b)(1), unless the authorization is terminated or revoked.  Performed at [facility], [place] [address], [place], [place] [address]       Radiology Studies: CT Angio Chest PE W and/or Wo Contrast  Result Date: 06/27/2022 CLINICAL DATA:  Sepsis EXAM: CT ANGIOGRAPHY CHEST CT ABDOMEN AND PELVIS WITH CONTRAST TECHNIQUE: Multidetector CT imaging of the chest was performed using the standard protocol during bolus administration of intravenous contrast. Multiplanar CT image reconstructions and MIPs were obtained to evaluate the vascular anatomy. Multidetector CT imaging of the abdomen and pelvis was performed using the standard protocol during bolus administration of intravenous contrast. RADIATION  DOSE REDUCTION: This exam was performed according to the departmental dose-optimization program which includes automated exposure control, adjustment of the mA and/or kV according to patient size and/or use of iterative reconstruction technique. CONTRAST:  152m OMNIPAQUE IOHEXOL 350 MG/ML SOLN COMPARISON:  None Available. FINDINGS: CTA CHEST FINDINGS Cardiovascular: No evidence of pulmonary embolus, although bolus timing somewhat limits evaluation of the segmental and subsegmental pulmonary arteries. Normal heart size. No pericardial effusion. Normal caliber thoracic aorta with no atherosclerotic disease. Mediastinum/Nodes: Small hiatal hernia. Thyroid is unremarkable. No pathologically enlarged lymph nodes seen in the chest Lungs/Pleura: Central airways are patent. Mild bibasilar atelectasis no consolidation, pleural effusion or pneumothorax Musculoskeletal: No chest wall abnormality. No acute or significant osseous findings. Review of the MIP images confirms the above findings. CT ABDOMEN and PELVIS FINDINGS Hepatobiliary: No focal liver abnormality is seen. No gallstones, gallbladder wall thickening, or biliary dilatation. Pancreas: Unremarkable. No pancreatic ductal dilatation or surrounding inflammatory changes. Spleen: Normal in size without focal abnormality. Adrenals/Urinary Tract: Bilateral adrenal glands are unremarkable. No hydronephrosis or nephrolithiasis. Bladder is unremarkable. Stomach/Bowel: Postsurgical changes of the stomach. Mild diffuse colonic wall thickening. Mild wall thickening of the proximal appendix with no periappendiceal fat stranding, appendix measures up to 10 mm in diameter. No evidence of obstruction Vascular/Lymphatic: No significant vascular findings are present. No enlarged abdominal or pelvic lymph nodes. Reproductive: Heterogeneous enhancement of the uterus. No adnexal masses. Other: No abdominal wall hernia or abnormality. No abdominopelvic ascites. Musculoskeletal: No acute  or significant osseous findings. Review of the MIP images  confirms the above findings. IMPRESSION: 1. No evidence of pulmonary embolus, although bolus timing somewhat limits evaluation of the segmental and subsegmental pulmonary arteries. 2. Mild diffuse colonic wall thickening, finding can be seen in the setting of colitis. 3. Mild wall thickening of the proximal appendix with no periappendiceal fat stranding, likely due to secondary involvement. Correlate for symptoms of right lower quadrant pain. 4. Heterogeneous enhancement of the uterus, likely due to multiple small fibroids fibroids. Finding could be further evaluated with nonemergent pelvic ultrasound. Electronically Signed   By: Yetta Glassman M.D.   On: 06/27/2022 21:00   CT ABDOMEN PELVIS W CONTRAST  Result Date: 06/27/2022 CLINICAL DATA:  Sepsis EXAM: CT ANGIOGRAPHY CHEST CT ABDOMEN AND PELVIS WITH CONTRAST TECHNIQUE: Multidetector CT imaging of the chest was performed using the standard protocol during bolus administration of intravenous contrast. Multiplanar CT image reconstructions and MIPs were obtained to evaluate the vascular anatomy. Multidetector CT imaging of the abdomen and pelvis was performed using the standard protocol during bolus administration of intravenous contrast. RADIATION DOSE REDUCTION: This exam was performed according to the departmental dose-optimization program which includes automated exposure control, adjustment of the mA and/or kV according to patient size and/or use of iterative reconstruction technique. CONTRAST:  128m OMNIPAQUE IOHEXOL 350 MG/ML SOLN COMPARISON:  None Available. FINDINGS: CTA CHEST FINDINGS Cardiovascular: No evidence of pulmonary embolus, although bolus timing somewhat limits evaluation of the segmental and subsegmental pulmonary arteries. Normal heart size. No pericardial effusion. Normal caliber thoracic aorta with no atherosclerotic disease. Mediastinum/Nodes: Small hiatal hernia. Thyroid is  unremarkable. No pathologically enlarged lymph nodes seen in the chest Lungs/Pleura: Central airways are patent. Mild bibasilar atelectasis no consolidation, pleural effusion or pneumothorax Musculoskeletal: No chest wall abnormality. No acute or significant osseous findings. Review of the MIP images confirms the above findings. CT ABDOMEN and PELVIS FINDINGS Hepatobiliary: No focal liver abnormality is seen. No gallstones, gallbladder wall thickening, or biliary dilatation. Pancreas: Unremarkable. No pancreatic ductal dilatation or surrounding inflammatory changes. Spleen: Normal in size without focal abnormality. Adrenals/Urinary Tract: Bilateral adrenal glands are unremarkable. No hydronephrosis or nephrolithiasis. Bladder is unremarkable. Stomach/Bowel: Postsurgical changes of the stomach. Mild diffuse colonic wall thickening. Mild wall thickening of the proximal appendix with no periappendiceal fat stranding, appendix measures up to 10 mm in diameter. No evidence of obstruction Vascular/Lymphatic: No significant vascular findings are present. No enlarged abdominal or pelvic lymph nodes. Reproductive: Heterogeneous enhancement of the uterus. No adnexal masses. Other: No abdominal wall hernia or abnormality. No abdominopelvic ascites. Musculoskeletal: No acute or significant osseous findings. Review of the MIP images confirms the above findings. IMPRESSION: 1. No evidence of pulmonary embolus, although bolus timing somewhat limits evaluation of the segmental and subsegmental pulmonary arteries. 2. Mild diffuse colonic wall thickening, finding can be seen in the setting of colitis. 3. Mild wall thickening of the proximal appendix with no periappendiceal fat stranding, likely due to secondary involvement. Correlate for symptoms of right lower quadrant pain. 4. Heterogeneous enhancement of the uterus, likely due to multiple small fibroids fibroids. Finding could be further evaluated with nonemergent pelvic  ultrasound. Electronically Signed   By: LYetta GlassmanM.D.   On: 06/27/2022 21:00   CT L-SPINE NO CHARGE  Result Date: 06/27/2022 CLINICAL DATA:  Patient complains of shaking.  Feeling fatigued. EXAM: CT LUMBAR SPINE WITHOUT CONTRAST TECHNIQUE: Multidetector CT imaging of the lumbar spine was performed without intravenous contrast administration. Multiplanar CT image reconstructions were also generated. RADIATION DOSE REDUCTION: This exam was performed according  to the departmental dose-optimization program which includes automated exposure control, adjustment of the mA and/or kV according to patient size and/or use of iterative reconstruction technique. COMPARISON:  None Available. FINDINGS: Segmentation: 5 lumbar type vertebrae. Alignment: Normal. Vertebrae: No acute fracture or focal pathologic process. Paraspinal and other soft tissues: Negative. Disc levels: Mild disc bulge at L4-L5 at L5-S1 without significant spinal canal or foraminal stenosis. Mild facet joint arthropathy at L4-L5 and L5-S1. IMPRESSION: 1. No acute fracture or subluxation. 2. Mild disc bulge at L4-L5 and L5-S1 without significant spinal canal or foraminal stenosis. 3. Mild facet joint arthropathy at L4-L5 and L5-S1. Electronically Signed   By: Keane Police D.O.   On: 06/27/2022 20:46   CT HEAD WO CONTRAST (5MM)  Result Date: 06/27/2022 CLINICAL DATA:  Fatigue. EXAM: CT HEAD WITHOUT CONTRAST TECHNIQUE: Contiguous axial images were obtained from the base of the skull through the vertex without intravenous contrast. RADIATION DOSE REDUCTION: This exam was performed according to the departmental dose-optimization program which includes automated exposure control, adjustment of the mA and/or kV according to patient size and/or use of iterative reconstruction technique. COMPARISON:  August 25, 2018 FINDINGS: Brain: No evidence of acute infarction, hemorrhage, hydrocephalus, extra-axial collection or mass lesion/mass effect. Vascular:  No hyperdense vessel or unexpected calcification. Skull: Normal. Negative for fracture or focal lesion. Sinuses/Orbits: No acute finding. Other: None. IMPRESSION: No acute intracranial pathology. Electronically Signed   By: Virgina Norfolk M.D.   On: 06/27/2022 20:40   DG Chest Portable 1 View  Result Date: 06/27/2022 CLINICAL DATA:  Fever, sepsis EXAM: PORTABLE CHEST 1 VIEW COMPARISON:  Chest radiograph 06/23/2021 FINDINGS: The cardiomediastinal silhouette is normal. There is no focal consolidation or pulmonary edema. There is no pleural effusion or pneumothorax There is no acute osseous abnormality. IMPRESSION: No radiographic evidence of acute cardiopulmonary process. Electronically Signed   By: Valetta Mole M.D.   On: 06/27/2022 17:33       LOS: 0 days   [facility] Pager on [email or web address]  06/28/2022, 8:56 AM

## 2022-06-28 NOTE — ED Notes (Signed)
ED TO INPATIENT HANDOFF REPORT  ED Nurse Name and Phone #: Jeannie Done Gerald Name/Age/Gender Mary Sanford 56 y.o. female Room/Bed: 002C/002C  Code Status   Code Status: Full Code  Home/SNF/Other Home Patient oriented to: self, place, time, and situation Is this baseline? Yes   Triage Complete: Triage complete  Chief Complaint Colitis [K52.9]  Triage Note BIB husband for shaking. Pt collapsed entering building w/o fall or LOC. Denies pain or injury. Picked up from ground and placed into w/c, taken to triage. Intermittent shaking present. A&Ox4. Slow deliberate thoughtful responses. Fever noted in triage. Denies pain, NVD. Believes she is having a med reaction, to recently prescribed meds.     Allergies Allergies  Allergen Reactions   Naproxen Other (See Comments)    Panic attacks No Problem with Motrin    Level of Care/Admitting Diagnosis ED Disposition     ED Disposition  Admit   Condition  --   [place]: [place] [[id number]]  Level of Care: Progressive [102]  Admit to Progressive based on following criteria: GI, ENDOCRINE disease patients with GI bleeding, acute liver failure or pancreatitis, stable with diabetic ketoacidosis or thyrotoxicosis (hypothyroid) state.  May admit patient to [facility] if equivalent level of care is available:: Yes  Covid Evaluation: Asymptomatic - no recent exposure (last 10 days) testing not required  Diagnosis: Colitis [341962]  Admitting Physician: Clance Boll [[id number]]  Attending Physician: Clance Boll [[id number]]  Certification:: I certify this patient will need inpatient services for at least 2 midnights  Estimated Length of Stay: 3          B Medical/Surgery History Past Medical History:  Diagnosis Date   Allergy    seasonal allergies   Anemia    Anxiety    Arthritis    hands, left knee   COVID    Depression    Family history of adverse reaction  to anesthesia    one sister ponv and slow to awaken   History of iron deficiency anemia    History of kidney stones    Hypertension    followed by pcp  (12-05-2019 pt had ETT 12-11-2011 , in epic, showed normal with no ischemia)   Nephrolithiasis    Panic disorder    PMB (postmenopausal bleeding)    Pre-diabetes    followed by pcp   Seasonal allergies    SUI (stress urinary incontinence, female)    Uterine fibroid    Wears glasses    Past Surgical History:  Procedure Laterality Date   BUNIONECTOMY Bilateral    CESAREAN SECTION  1989   CYSTOSCOPY W/ RETROGRADES Left 06/28/2018   Procedure: CYSTOSCOPY WITH RETROGRADE PYELOGRAM LEFT STENT;  Surgeon: Ceasar Mons, MD;  Location: [facility];  Service: Urology;  Laterality: Left;   CYSTOSCOPY/URETEROSCOPY/HOLMIUM LASER/STENT PLACEMENT Left 07/16/2018   Procedure: CYSTOSCOPY/URETEROSCOPY/HOLMIUM LASER/STENT EXCHANGE;  Surgeon: Ceasar Mons, MD;  Location: [facility];  Service: Urology;  Laterality: Left;   DILATATION & CURETTAGE/HYSTEROSCOPY WITH MYOSURE N/A 12/13/2019   Procedure: DILATATION & CURETTAGE/HYSTEROSCOPY WITH MYOSURE;  Surgeon: Salvadore Dom, MD;  Location: [facility];  Service: Gynecology;  Laterality: N/A;   DILITATION & CURRETTAGE/HYSTROSCOPY WITH NOVASURE ABLATION N/A 06/17/2013   Procedure: DILATATION & CURETTAGE/HYSTEROSCOPY WITH NOVASURE ABLATION;  Surgeon: Osborne Oman, MD;  Location: [facility];  Service: Gynecology;  Laterality: N/A;   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   Procedure: LAPAROSCOPIC ROUX-EN-Y GASTRIC BYPASS  WITH UPPER ENDOSCOPY;  Surgeon: Greer Pickerel, MD;  Location: [facility];  Service: General;  Laterality: N/A;   HEMORRHOIDECTOMY WITH HEMORRHOID BANDING  10/19/2012   [facility] [place] Hem ligation/pexy   OPERATIVE ULTRASOUND N/A 12/13/2019   Procedure: OPERATIVE ULTRASOUND;  Surgeon: Salvadore Dom, MD;  Location: [facility]  Service:  Gynecology;  Laterality: N/A;   Thumb surgery   04/2021   TUBAL LIGATION Bilateral 2001   UPPER GASTROINTESTINAL ENDOSCOPY  10/11/2020   WISDOM TOOTH EXTRACTION       A IV Location/Drains/Wounds Patient Lines/Drains/Airways Status     Active Line/Drains/Airways     Name Placement date Placement time Site Days   Peripheral IV 06/27/22 20 G Right Antecubital 06/27/22  1550  Antecubital  1   Ureteral Drain/Stent Left ureter 6 Fr. 07/16/18  1238  Left ureter  1443   Incision (Closed) 06/22/18 Abdomen Other (Comment) 06/22/18  0814  -- 1467   Incision (Closed) 07/16/18 Perineum 07/16/18  1138  -- 1443   Incision (Closed) 12/13/19 Vagina Other (Comment) 12/13/19  0902  -- 928            Intake/Output Last 24 hours No intake or output data in the 24 hours ending 06/28/22 1900  Labs/Imaging Results for orders placed or performed during the hospital encounter of 06/27/22 (from the past 48 hour(s))  Comprehensive metabolic panel     Status: Abnormal   Collection Time: 06/27/22  3:23 PM  Result Value Ref Range   Sodium 137 135 - 145 mmol/L   Potassium 3.8 3.5 - 5.1 mmol/L   Chloride 101 98 - 111 mmol/L   CO2 24 22 - 32 mmol/L   Glucose, Bld 111 (H) 70 - 99 mg/dL    Comment: Glucose reference range applies only to samples taken after fasting for at least 8 hours.   BUN 13 6 - 20 mg/dL   Creatinine, Ser 0.98 0.44 - 1.00 mg/dL   Calcium 9.5 8.9 - 10.3 mg/dL   Total Protein 6.4 (L) 6.5 - 8.1 g/dL   Albumin 3.4 (L) 3.5 - 5.0 g/dL   AST 25 15 - 41 U/L   ALT 24 0 - 44 U/L   Alkaline Phosphatase 63 38 - 126 U/L   Total Bilirubin 0.4 0.3 - 1.2 mg/dL   GFR, Estimated >60 >60 mL/min    Comment: (NOTE) Calculated using the CKD-EPI Creatinine Equation (2021)    Anion gap 12 5 - 15    Comment: Performed at [facility], [place] [address] [place], [place] [address]  CBC with Differential     Status: Abnormal   Collection Time: 06/27/22  3:23 PM  Result Value Ref Range   WBC 8.4  4.0 - 10.5 K/uL   RBC 5.38 (H) 3.87 - 5.11 MIL/uL   Hemoglobin 12.6 12.0 - 15.0 g/dL   HCT 40.1 36.0 - 46.0 %   MCV 74.5 (L) 80.0 - 100.0 fL   MCH 23.4 (L) 26.0 - 34.0 pg   MCHC 31.4 30.0 - 36.0 g/dL   RDW 14.2 11.5 - 15.5 %   Platelets 215 150 - 400 K/uL   nRBC 0.0 0.0 - 0.2 %   Neutrophils Relative % 79 %   Neutro Abs 6.6 1.7 - 7.7 K/uL   Lymphocytes Relative 11 %   Lymphs Abs 1.0 0.7 - 4.0 K/uL   Monocytes Relative 9 %   Monocytes Absolute 0.8 0.1 - 1.0 K/uL   Eosinophils Relative 0 %  Eosinophils Absolute 0.0 0.0 - 0.5 K/uL   Basophils Relative 0 %   Basophils Absolute 0.0 0.0 - 0.1 K/uL   Immature Granulocytes 1 %   Abs Immature Granulocytes 0.04 0.00 - 0.07 K/uL    Comment: Performed at [facility] [address] [place], [place] [address]  Protime-INR     Status: None   Collection Time: 06/27/22  3:23 PM  Result Value Ref Range   Prothrombin Time 13.9 11.4 - 15.2 seconds   INR 1.1 0.8 - 1.2    Comment: (NOTE) INR goal varies based on device and disease states. Performed at [facility], [place] [address], [place], [place] [address]   APTT     Status: None   Collection Time: 06/27/22  3:23 PM  Result Value Ref Range   aPTT 26 24 - 36 seconds    Comment: Performed at [facility] [address] [place], [place] [address]  T4, free     Status: None   Collection Time: 06/27/22  3:23 PM  Result Value Ref Range   Free T4 0.68 0.61 - 1.12 ng/dL    Comment: (NOTE) Biotin ingestion may interfere with free T4 tests. If the results are inconsistent with the TSH level, previous test results, or the clinical presentation, then consider biotin interference. If needed, order repeat testing after stopping biotin. Performed at [facility], [place] [address], [place], [place] [address]   Lactic acid, plasma     Status: None   Collection Time: 06/27/22  3:50 PM  Result Value Ref Range   Lactic Acid, Venous 1.7 0.5 - 1.9 mmol/L    Comment: Performed  at [facility] [address], [place], [place] [address]  Blood Culture (routine x 2)     Status: None (Preliminary result)   Collection Time: 06/27/22  3:50 PM   Specimen: BLOOD  Result Value Ref Range   Specimen Description BLOOD RIGHT ANTECUBITAL    Special Requests      BOTTLES DRAWN AEROBIC AND ANAEROBIC Blood Culture results may not be optimal due to an excessive volume of blood received in culture bottles   Culture      NO GROWTH < 24 HOURS Performed at [facility] [address] [place], [place] [address]    Report Status PENDING   Blood Culture (routine x 2)     Status: None (Preliminary result)   Collection Time: 06/27/22  3:51 PM   Specimen: BLOOD  Result Value Ref Range   Specimen Description BLOOD LEFT ANTECUBITAL    Special Requests      BOTTLES DRAWN AEROBIC AND ANAEROBIC Blood Culture adequate volume   Culture      NO GROWTH < 24 HOURS Performed at [facility], [place] [address] [place], [place] [address]    Report Status PENDING   Resp Panel by RT-PCR (Flu A&B, Covid) Anterior Nasal Swab     Status: None   Collection Time: 06/27/22  3:51 PM   Specimen: Anterior Nasal Swab  Result Value Ref Range   SARS Coronavirus 2 by RT PCR NEGATIVE NEGATIVE    Comment: (NOTE) SARS-CoV-2 target nucleic acids are NOT DETECTED.  The SARS-CoV-2 RNA is generally detectable in upper respiratory specimens during the acute phase of infection. The lowest concentration of SARS-CoV-2 viral copies this assay can detect is 138 copies/mL. A negative result does not preclude SARS-Cov-2 infection and should not be used as  the sole basis for treatment or other patient management decisions. A negative result may occur with  improper specimen collection/handling, submission of specimen other than nasopharyngeal swab, presence of viral mutation(s) within the areas targeted by this assay, and inadequate number of viral copies(<138 copies/mL). A negative result must be  combined with clinical observations, patient history, and epidemiological information. The expected result is Negative.  Fact Sheet for Patients:  EntrepreneurPulse.com.au  Fact Sheet for Healthcare Providers:  IncredibleEmployment.be  This test is no t yet approved or cleared by the [place] FDA and  has been authorized for detection and/or diagnosis of SARS-CoV-2 by FDA under an Emergency Use Authorization (EUA). This EUA will remain  in effect (meaning this test can be used) for the duration of the COVID-19 declaration under Section 564(b)(1) of the Act, 21 U.S.C.section 360bbb-3(b)(1), unless the authorization is terminated  or revoked sooner.       Influenza A by PCR NEGATIVE NEGATIVE   Influenza B by PCR NEGATIVE NEGATIVE    Comment: (NOTE) The Xpert Xpress SARS-CoV-2/FLU/RSV plus assay is intended as an aid in the diagnosis of influenza from Nasopharyngeal swab specimens and should not be used as a sole basis for treatment. Nasal washings and aspirates are unacceptable for Xpert Xpress SARS-CoV-2/FLU/RSV testing.  Fact Sheet for Patients: EntrepreneurPulse.com.au  Fact Sheet for Healthcare Providers: IncredibleEmployment.be  This test is not yet approved or cleared by the [place] FDA and has been authorized for detection and/or diagnosis of SARS-CoV-2 by FDA under an Emergency Use Authorization (EUA). This EUA will remain in effect (meaning this test can be used) for the duration of the COVID-19 declaration under Section 564(b)(1) of the Act, 21 U.S.C. section 360bbb-3(b)(1), unless the authorization is terminated or revoked.  Performed at [facility], [place] [address], [place], [place] [address]   Ethanol     Status: None   Collection Time: 06/27/22  3:53 PM  Result Value Ref Range   Alcohol, Ethyl (B) <10 <10 mg/dL    Comment: (NOTE) Lowest detectable limit for serum  alcohol is 10 mg/dL.  For medical purposes only. Performed at [facility], [place] [address] [place], [place] [address]   Troponin I (High Sensitivity)     Status: None   Collection Time: 06/27/22  3:53 PM  Result Value Ref Range   Troponin I (High Sensitivity) 3 <18 ng/L    Comment: (NOTE) Elevated high sensitivity troponin I (hsTnI) values and significant  changes across serial measurements may suggest ACS but many other  chronic and acute conditions are known to elevate hsTnI results.  Refer to the "Links" section for chest pain algorithms and additional  guidance. Performed at [facility], [place] [address] [place], [place] [address]   Lipase, blood     Status: None   Collection Time: 06/27/22  3:54 PM  Result Value Ref Range   Lipase 32 11 - 51 U/L    Comment: Performed at [facility], [place] [address] [place], [place] [address]  D-dimer, quantitative     Status: Abnormal   Collection Time: 06/27/22  3:54 PM  Result Value Ref Range   D-Dimer, Quant 0.75 (H) 0.00 - 0.50 ug/mL-FEU    Comment: (NOTE) At the manufacturer cut-off value of 0.5 g/mL FEU, this assay has a negative predictive value of 95-100%.This assay is intended for use in conjunction with a clinical pretest probability (PTP) assessment model to exclude pulmonary embolism (PE) and deep venous  thrombosis (DVT) in outpatients suspected of PE or DVT. Results should be correlated with clinical presentation. Performed at [facility], [place] [address] [place], [place] [address]   I-Stat beta hCG blood, ED     Status: None   Collection Time: 06/27/22  4:15 PM  Result Value Ref Range   I-stat hCG, quantitative <5.0 <5 mIU/mL   Comment 3            Comment:   GEST. AGE      CONC.  (mIU/mL)   <=1 WEEK        5 - 50     2 WEEKS       50 - 500     3 WEEKS       100 - 10,000     4 WEEKS     1,000 - 30,000        FEMALE AND NON-PREGNANT FEMALE:     LESS THAN 5 mIU/mL   Urinalysis, Routine w  reflex microscopic Urine, Clean Catch     Status: Abnormal   Collection Time: 06/27/22  7:47 PM  Result Value Ref Range   Color, Urine STRAW (A) YELLOW   APPearance CLEAR CLEAR   Specific Gravity, Urine 1.019 1.005 - 1.030   pH 5.0 5.0 - 8.0   Glucose, UA NEGATIVE NEGATIVE mg/dL   Hgb urine dipstick NEGATIVE NEGATIVE   Bilirubin Urine NEGATIVE NEGATIVE   Ketones, ur NEGATIVE NEGATIVE mg/dL   Protein, ur NEGATIVE NEGATIVE mg/dL   Nitrite NEGATIVE NEGATIVE   Leukocytes,Ua NEGATIVE NEGATIVE    Comment: Performed at [facility] [address], [place], [place] [address]  Troponin I (High Sensitivity)     Status: None   Collection Time: 06/27/22  8:39 PM  Result Value Ref Range   Troponin I (High Sensitivity) 7 <18 ng/L    Comment: (NOTE) Elevated high sensitivity troponin I (hsTnI) values and significant  changes across serial measurements may suggest ACS but many other  chronic and acute conditions are known to elevate hsTnI results.  Refer to the "Links" section for chest pain algorithms and additional  guidance. Performed at [facility], [place] [address], [place], [place] [address]   HIV Antibody (routine testing w rflx)     Status: None   Collection Time: 06/28/22  1:33 AM  Result Value Ref Range   HIV Screen 4th Generation wRfx Non Reactive Non Reactive    Comment: Performed at [facility], [place] [address] [place], [place] [address]  CBC     Status: Abnormal   Collection Time: 06/28/22  1:33 AM  Result Value Ref Range   WBC 6.4 4.0 - 10.5 K/uL   RBC 4.82 3.87 - 5.11 MIL/uL   Hemoglobin 11.6 (L) 12.0 - 15.0 g/dL   HCT 36.1 36.0 - 46.0 %   MCV 74.9 (L) 80.0 - 100.0 fL   MCH 24.1 (L) 26.0 - 34.0 pg   MCHC 32.1 30.0 - 36.0 g/dL   RDW 14.3 11.5 - 15.5 %   Platelets 169 150 - 400 K/uL   nRBC 0.0 0.0 - 0.2 %    Comment: Performed at [facility], [place] [address], [place], [place] [address]  Creatinine, serum     Status: None   Collection Time:  06/28/22  1:33 AM  Result Value Ref Range   Creatinine, Ser 0.95 0.44 - 1.00 mg/dL   GFR, Estimated >60 >60 mL/min  Comment: (NOTE) Calculated using the CKD-EPI Creatinine Equation (2021) Performed at [facility], [place] [address], [place], [place] [address]   C-reactive protein     Status: Abnormal   Collection Time: 06/28/22  1:33 AM  Result Value Ref Range   CRP 8.9 (H) <1.0 mg/dL    Comment: Performed at [facility] [address], [place], [place] [address]  Ferritin     Status: None   Collection Time: 06/28/22  1:33 AM  Result Value Ref Range   Ferritin 104 11 - 307 ng/mL    Comment: Performed at [facility], [place] [address], [place], [place] [address]  Procalcitonin - Baseline     Status: None   Collection Time: 06/28/22  1:33 AM  Result Value Ref Range   Procalcitonin <0.10 ng/mL    Comment:        Interpretation: PCT (Procalcitonin) <= 0.5 ng/mL: Systemic infection (sepsis) is not likely. Local bacterial infection is possible. (NOTE)       Sepsis PCT Algorithm           Lower Respiratory Tract                                      Infection PCT Algorithm    ----------------------------     ----------------------------         PCT < 0.25 ng/mL                PCT < 0.10 ng/mL          Strongly encourage             Strongly discourage   discontinuation of antibiotics    initiation of antibiotics    ----------------------------     -----------------------------       PCT 0.25 - 0.50 ng/mL            PCT 0.10 - 0.25 ng/mL               OR       >80% decrease in PCT            Discourage initiation of                                            antibiotics      Encourage discontinuation           of antibiotics    ----------------------------     -----------------------------         PCT >= 0.50 ng/mL              PCT 0.26 - 0.50 ng/mL               AND        <80% decrease in PCT             Encourage initiation of                                              antibiotics       Encourage continuation           of antibiotics    ----------------------------     -----------------------------  PCT >= 0.50 ng/mL                  PCT > 0.50 ng/mL               AND         increase in PCT                  Strongly encourage                                      initiation of antibiotics    Strongly encourage escalation           of antibiotics                                     -----------------------------                                           PCT <= 0.25 ng/mL                                                 OR                                        > 80% decrease in PCT                                      Discontinue / Do not initiate                                             antibiotics  Performed at [facility], [address], [place], [place] [address]   Ammonia     Status: None   Collection Time: 06/28/22  1:33 AM  Result Value Ref Range   Ammonia 27 9 - 35 umol/L    Comment: Performed at [facility], [place] [address] [place], [place] [address]  TSH     Status: None   Collection Time: 06/28/22  1:33 AM  Result Value Ref Range   TSH 0.682 0.350 - 4.500 uIU/mL    Comment: Performed by a 3rd Generation assay with a functional sensitivity of <=0.01 uIU/mL. Performed at [facility], [place] [address] [place], [place] [address]   Comprehensive metabolic panel     Status: Abnormal   Collection Time: 06/28/22  4:08 AM  Result Value Ref Range   Sodium 137 135 - 145 mmol/L   Potassium 3.5 3.5 - 5.1 mmol/L   Chloride 103 98 - 111 mmol/L   CO2 24 22 - 32 mmol/L   Glucose, Bld 117 (H) 70 - 99 mg/dL    Comment: Glucose reference range applies only to samples taken after fasting for at least 8 hours.   BUN 8  6 - 20 mg/dL   Creatinine, Ser 0.80 0.44 - 1.00 mg/dL   Calcium 9.1 8.9 - 10.3 mg/dL   Total Protein 5.3 (L) 6.5 - 8.1 g/dL   Albumin 2.7 (L) 3.5 - 5.0 g/dL   AST 21 15 - 41 U/L   ALT 20 0 -  44 U/L   Alkaline Phosphatase 54 38 - 126 U/L   Total Bilirubin 0.3 0.3 - 1.2 mg/dL   GFR, Estimated >60 >60 mL/min    Comment: (NOTE) Calculated using the CKD-EPI Creatinine Equation (2021)    Anion gap 10 5 - 15    Comment: Performed at [facility], [place] [address], [place], [place] [address]  CBC     Status: Abnormal   Collection Time: 06/28/22  4:08 AM  Result Value Ref Range   WBC 6.7 4.0 - 10.5 K/uL   RBC 4.64 3.87 - 5.11 MIL/uL   Hemoglobin 11.0 (L) 12.0 - 15.0 g/dL   HCT 34.4 (L) 36.0 - 46.0 %   MCV 74.1 (L) 80.0 - 100.0 fL   MCH 23.7 (L) 26.0 - 34.0 pg   MCHC 32.0 30.0 - 36.0 g/dL   RDW 14.3 11.5 - 15.5 %   Platelets 187 150 - 400 K/uL   nRBC 0.0 0.0 - 0.2 %    Comment: Performed at [facility], [place] [address], [place], [place] [address]   CT Angio Chest PE W and/or Wo Contrast  Result Date: 06/27/2022 CLINICAL DATA:  Sepsis EXAM: CT ANGIOGRAPHY CHEST CT ABDOMEN AND PELVIS WITH CONTRAST TECHNIQUE: Multidetector CT imaging of the chest was performed using the standard protocol during bolus administration of intravenous contrast. Multiplanar CT image reconstructions and MIPs were obtained to evaluate the vascular anatomy. Multidetector CT imaging of the abdomen and pelvis was performed using the standard protocol during bolus administration of intravenous contrast. RADIATION DOSE REDUCTION: This exam was performed according to the departmental dose-optimization program which includes automated exposure control, adjustment of the mA and/or kV according to patient size and/or use of iterative reconstruction technique. CONTRAST:  142m OMNIPAQUE IOHEXOL 350 MG/ML SOLN COMPARISON:  None Available. FINDINGS: CTA CHEST FINDINGS Cardiovascular: No evidence of pulmonary embolus, although bolus timing somewhat limits evaluation of the segmental and subsegmental pulmonary arteries. Normal heart size. No pericardial effusion. Normal caliber thoracic aorta with no atherosclerotic  disease. Mediastinum/Nodes: Small hiatal hernia. Thyroid is unremarkable. No pathologically enlarged lymph nodes seen in the chest Lungs/Pleura: Central airways are patent. Mild bibasilar atelectasis no consolidation, pleural effusion or pneumothorax Musculoskeletal: No chest wall abnormality. No acute or significant osseous findings. Review of the MIP images confirms the above findings. CT ABDOMEN and PELVIS FINDINGS Hepatobiliary: No focal liver abnormality is seen. No gallstones, gallbladder wall thickening, or biliary dilatation. Pancreas: Unremarkable. No pancreatic ductal dilatation or surrounding inflammatory changes. Spleen: Normal in size without focal abnormality. Adrenals/Urinary Tract: Bilateral adrenal glands are unremarkable. No hydronephrosis or nephrolithiasis. Bladder is unremarkable. Stomach/Bowel: Postsurgical changes of the stomach. Mild diffuse colonic wall thickening. Mild wall thickening of the proximal appendix with no periappendiceal fat stranding, appendix measures up to 10 mm in diameter. No evidence of obstruction Vascular/Lymphatic: No significant vascular findings are present. No enlarged abdominal or pelvic lymph nodes. Reproductive: Heterogeneous enhancement of the uterus. No adnexal masses. Other: No abdominal wall hernia or abnormality. No abdominopelvic ascites. Musculoskeletal: No acute or significant osseous findings. Review of the MIP images confirms the above findings. IMPRESSION: 1. No evidence of pulmonary embolus, although bolus  timing somewhat limits evaluation of the segmental and subsegmental pulmonary arteries. 2. Mild diffuse colonic wall thickening, finding can be seen in the setting of colitis. 3. Mild wall thickening of the proximal appendix with no periappendiceal fat stranding, likely due to secondary involvement. Correlate for symptoms of right lower quadrant pain. 4. Heterogeneous enhancement of the uterus, likely due to multiple small fibroids fibroids. Finding  could be further evaluated with nonemergent pelvic ultrasound. Electronically Signed   By: Yetta Glassman M.D.   On: 06/27/2022 21:00   CT ABDOMEN PELVIS W CONTRAST  Result Date: 06/27/2022 CLINICAL DATA:  Sepsis EXAM: CT ANGIOGRAPHY CHEST CT ABDOMEN AND PELVIS WITH CONTRAST TECHNIQUE: Multidetector CT imaging of the chest was performed using the standard protocol during bolus administration of intravenous contrast. Multiplanar CT image reconstructions and MIPs were obtained to evaluate the vascular anatomy. Multidetector CT imaging of the abdomen and pelvis was performed using the standard protocol during bolus administration of intravenous contrast. RADIATION DOSE REDUCTION: This exam was performed according to the departmental dose-optimization program which includes automated exposure control, adjustment of the mA and/or kV according to patient size and/or use of iterative reconstruction technique. CONTRAST:  168m OMNIPAQUE IOHEXOL 350 MG/ML SOLN COMPARISON:  None Available. FINDINGS: CTA CHEST FINDINGS Cardiovascular: No evidence of pulmonary embolus, although bolus timing somewhat limits evaluation of the segmental and subsegmental pulmonary arteries. Normal heart size. No pericardial effusion. Normal caliber thoracic aorta with no atherosclerotic disease. Mediastinum/Nodes: Small hiatal hernia. Thyroid is unremarkable. No pathologically enlarged lymph nodes seen in the chest Lungs/Pleura: Central airways are patent. Mild bibasilar atelectasis no consolidation, pleural effusion or pneumothorax Musculoskeletal: No chest wall abnormality. No acute or significant osseous findings. Review of the MIP images confirms the above findings. CT ABDOMEN and PELVIS FINDINGS Hepatobiliary: No focal liver abnormality is seen. No gallstones, gallbladder wall thickening, or biliary dilatation. Pancreas: Unremarkable. No pancreatic ductal dilatation or surrounding inflammatory changes. Spleen: Normal in size without  focal abnormality. Adrenals/Urinary Tract: Bilateral adrenal glands are unremarkable. No hydronephrosis or nephrolithiasis. Bladder is unremarkable. Stomach/Bowel: Postsurgical changes of the stomach. Mild diffuse colonic wall thickening. Mild wall thickening of the proximal appendix with no periappendiceal fat stranding, appendix measures up to 10 mm in diameter. No evidence of obstruction Vascular/Lymphatic: No significant vascular findings are present. No enlarged abdominal or pelvic lymph nodes. Reproductive: Heterogeneous enhancement of the uterus. No adnexal masses. Other: No abdominal wall hernia or abnormality. No abdominopelvic ascites. Musculoskeletal: No acute or significant osseous findings. Review of the MIP images confirms the above findings. IMPRESSION: 1. No evidence of pulmonary embolus, although bolus timing somewhat limits evaluation of the segmental and subsegmental pulmonary arteries. 2. Mild diffuse colonic wall thickening, finding can be seen in the setting of colitis. 3. Mild wall thickening of the proximal appendix with no periappendiceal fat stranding, likely due to secondary involvement. Correlate for symptoms of right lower quadrant pain. 4. Heterogeneous enhancement of the uterus, likely due to multiple small fibroids fibroids. Finding could be further evaluated with nonemergent pelvic ultrasound. Electronically Signed   By: LYetta GlassmanM.D.   On: 06/27/2022 21:00   CT L-SPINE NO CHARGE  Result Date: 06/27/2022 CLINICAL DATA:  Patient complains of shaking.  Feeling fatigued. EXAM: CT LUMBAR SPINE WITHOUT CONTRAST TECHNIQUE: Multidetector CT imaging of the lumbar spine was performed without intravenous contrast administration. Multiplanar CT image reconstructions were also generated. RADIATION DOSE REDUCTION: This exam was performed according to the departmental dose-optimization program which includes automated exposure control, adjustment of the  mA and/or kV according to  patient size and/or use of iterative reconstruction technique. COMPARISON:  None Available. FINDINGS: Segmentation: 5 lumbar type vertebrae. Alignment: Normal. Vertebrae: No acute fracture or focal pathologic process. Paraspinal and other soft tissues: Negative. Disc levels: Mild disc bulge at L4-L5 at L5-S1 without significant spinal canal or foraminal stenosis. Mild facet joint arthropathy at L4-L5 and L5-S1. IMPRESSION: 1. No acute fracture or subluxation. 2. Mild disc bulge at L4-L5 and L5-S1 without significant spinal canal or foraminal stenosis. 3. Mild facet joint arthropathy at L4-L5 and L5-S1. Electronically Signed   By: Keane Police D.O.   On: 06/27/2022 20:46   CT HEAD WO CONTRAST (5MM)  Result Date: 06/27/2022 CLINICAL DATA:  Fatigue. EXAM: CT HEAD WITHOUT CONTRAST TECHNIQUE: Contiguous axial images were obtained from the base of the skull through the vertex without intravenous contrast. RADIATION DOSE REDUCTION: This exam was performed according to the departmental dose-optimization program which includes automated exposure control, adjustment of the mA and/or kV according to patient size and/or use of iterative reconstruction technique. COMPARISON:  August 25, 2018 FINDINGS: Brain: No evidence of acute infarction, hemorrhage, hydrocephalus, extra-axial collection or mass lesion/mass effect. Vascular: No hyperdense vessel or unexpected calcification. Skull: Normal. Negative for fracture or focal lesion. Sinuses/Orbits: No acute finding. Other: None. IMPRESSION: No acute intracranial pathology. Electronically Signed   By: Virgina Norfolk M.D.   On: 06/27/2022 20:40   DG Chest Portable 1 View  Result Date: 06/27/2022 CLINICAL DATA:  Fever, sepsis EXAM: PORTABLE CHEST 1 VIEW COMPARISON:  Chest radiograph 06/23/2021 FINDINGS: The cardiomediastinal silhouette is normal. There is no focal consolidation or pulmonary edema. There is no pleural effusion or pneumothorax There is no acute osseous  abnormality. IMPRESSION: No radiographic evidence of acute cardiopulmonary process. Electronically Signed   By: Valetta Mole M.D.   On: 06/27/2022 17:33    Pending Labs Unresulted Labs (From admission, onward)     Start     Ordered   06/29/22 0500  CBC  Daily at 5am,   R      06/28/22 0905   06/29/22 0500  Comprehensive metabolic panel  Daily at 5am,   R      06/28/22 0905   06/29/22 0500  Magnesium  Tomorrow morning,   R        06/28/22 0905   06/28/22 0856  Gastrointestinal Panel by PCR , Stool  (Gastrointestinal Panel by PCR, Stool                                                                                                                                                     **Does Not include CLOSTRIDIUM DIFFICILE testing. **If CDIFF testing is needed, place order from the "C Difficile Testing" order set.**)  Once,   R        06/28/22 0855  06/28/22 0837  C Difficile Quick Screen w PCR reflex  (C Difficile quick screen w PCR reflex panel )  Once, for 24 hours,   TIMED       References:    CDiff Information Tool   06/28/22 0836   06/28/22 0042  Norovirus group 1 & 2 by PCR, stool  (Norovirus group 1 & 2 by PCR, stool panel)  Once,   R        06/28/22 0044   06/27/22 1616  MRSA Next Gen by PCR, Nasal  (MRSA Screening)  Once,   URGENT        06/27/22 1615   06/27/22 1554  TSH  Once,   URGENT        06/27/22 1553   06/27/22 1553  Rapid urine drug screen (hospital performed)  ONCE - STAT,   STAT        06/27/22 1553   06/27/22 1552  Urine Culture  (Septic presentation on arrival (screening labs, nursing and treatment orders for obvious sepsis))  Add-on,   AD       Question:  Indication  Answer:  Altered mental status (if no other cause identified)   06/27/22 1551            Vitals/Pain Today's Vitals   06/28/22 1606 06/28/22 1700 06/28/22 1800 06/28/22 1813  BP:  134/88 (!) 142/84   Pulse:  94 97   Resp:  (!) 22 (!) 23   Temp: 98.7 F (37.1 C)     TempSrc: Oral     SpO2:   98% 98%   Weight:      Height:      PainSc:    8     Isolation Precautions Enteric precautions (UV disinfection)  Medications Medications  vancomycin (VANCOREADY) IVPB 1250 mg/250 mL (0 mg Intravenous Stopped 06/28/22 1809)  nebivolol (BYSTOLIC) tablet 5 mg (5 mg Oral Given 06/28/22 1034)  escitalopram (LEXAPRO) tablet 20 mg (20 mg Oral Given 06/28/22 1034)  cyanocobalamin (VITAMIN B12) tablet 1,000 mcg (1,000 mcg Oral Given 06/28/22 1033)  heparin injection 5,000 Units (5,000 Units Subcutaneous Given 06/28/22 1405)  albuterol (PROVENTIL) (2.5 MG/3ML) 0.083% nebulizer solution 2.5 mg (has no administration in time range)  metroNIDAZOLE (FLAGYL) IVPB 500 mg (0 mg Intravenous Stopped 06/28/22 1724)  lidocaine (LIDODERM) 5 % 1 patch (1 patch Transdermal Not Given 06/28/22 1046)  cefTRIAXone (ROCEPHIN) 2 g in sodium chloride 0.9 % 100 mL IVPB (0 g Intravenous Stopped 06/28/22 0951)  acetaminophen (TYLENOL) tablet 650 mg (has no administration in time range)  morphine (PF) 2 MG/ML injection 2 mg (2 mg Intravenous Given 06/28/22 1044)  oxyCODONE (Oxy IR/ROXICODONE) immediate release tablet 5 mg (5 mg Oral Given 06/28/22 1813)  LORazepam (ATIVAN) tablet 1-4 mg (has no administration in time range)    Or  LORazepam (ATIVAN) injection 1-4 mg (has no administration in time range)  thiamine (VITAMIN B1) tablet 100 mg (100 mg Oral Given 06/28/22 1034)    Or  thiamine (VITAMIN B1) injection 100 mg ( Intravenous See Alternative 85/63/14 9702)  folic acid (FOLVITE) tablet 1 mg (1 mg Oral Given 06/28/22 1034)  multivitamin with minerals tablet 1 tablet (1 tablet Oral Given 06/28/22 1034)  0.9 % NaCl with KCl 20 mEq/ L  infusion ( Intravenous Restarted 06/28/22 1810)  acetaminophen (TYLENOL) tablet 1,000 mg (1,000 mg Oral Given 06/27/22 1539)  ceFEPIme (MAXIPIME) 2 g in sodium chloride 0.9 % 100 mL IVPB (0 g Intravenous Stopped 06/27/22 1641)  metroNIDAZOLE (FLAGYL) IVPB 500 mg (0 mg Intravenous  Stopped 06/27/22 1825)  lactated ringers bolus 1,000 mL (0 mLs Intravenous Stopped 06/27/22 2031)  vancomycin (VANCOREADY) IVPB 1750 mg/350 mL (0 mg Intravenous Stopped 06/27/22 2124)  LORazepam (ATIVAN) injection 1 mg (1 mg Intravenous Given 06/27/22 1603)  iohexol (OMNIPAQUE) 350 MG/ML injection 100 mL (100 mLs Intravenous Contrast Given 06/27/22 1941)  acetaminophen (TYLENOL) tablet 650 mg (650 mg Oral Given 06/27/22 2213)  ondansetron (ZOFRAN) injection 4 mg (4 mg Intravenous Given 06/27/22 2326)  morphine (PF) 4 MG/ML injection 4 mg (4 mg Intravenous Given 06/27/22 2326)    Mobility walks Low fall risk   Focused Assessments     R Recommendations: See Admitting Provider Note  Report given to:   Additional Notes: a/ox4, ambulatory, VSS, afebrile

## 2022-06-28 NOTE — Consult Note (Addendum)
NEUROLOGY CONSULTATION NOTE   Date of service: June 28, 2022 Patient Name: Mary Sanford MRN:  [id number] DOB:  [birth year] Reason for consult: "presyncope, random myoclonic jerks" Requesting Provider: Clance Boll, MD _ _ _   _ __   _ __ _ _  __ __   _ __   __ _  History of Present Illness  Mary Sanford is a 56 y.o. female with PMH significant for HTN, kidney stones, HTN, prediabetes who presents with episode of lightheadedness and almost passing out along with feeling foggy and loopy in her head, buzzing sensation in her head, diarrhea and random jerks.  Patient reports that she saw PCP on 11/20 for low back pain on the right. She was diagnosed with musculoskeletal pain and given flexeril and percocet along with follow up with sports medicine. Also reports profuse diarrhea over the last 3-4 days, grandson sick recently with URI like syptoms and febrile in the [facility] ED. She reports that she has been taking flexeril '10mg'$  about 2 times a day and percocet about 2 times a day also. Over the last 1-2 days, has been feeling drosy, loopy and off balance. Also feeling a buzzing in her head.  She has not been able to drink enough fluids to keep up with diarrhea.  She reports that she was not feeling good and so decided to come to the ED. She had a presyncopal episode in the ED at the entrance and fell but did not hit her head and husband held her, she had another epsiode while going to the bathroom in the ED.  In the ED, was noted to have random jerks from time to time. I only noticed 2 during my exam. She feels like a twitch like her body just gives out during these jerks. Never had anything like this in the past.  Neurology consulted for further evaluation and workup.  She drinks about a bottle or 2 of wine in a week.    ROS   Constitutional Denies weight loss, + fever and chills.   HEENT Denies changes in vision and hearing.   Respiratory Denies SOB and cough.   CV Denies  palpitations and CP   GI + abdominal pain, nausea, vomiting and diarrhea.   GU Denies dysuria and urinary frequency.   MSK Denies myalgia and joint pain.   Skin Denies rash and pruritus.   Neurological denies headache but has buzzing in her head, endorses pre-syncope.   Psychiatric Denies recent changes in mood. Denies anxiety and depression.    Past History   Past Medical History:  Diagnosis Date   Allergy    seasonal allergies   Anemia    Anxiety    Arthritis    hands, left knee   COVID    Depression    Family history of adverse reaction to anesthesia    one sister ponv and slow to awaken   History of iron deficiency anemia    History of kidney stones    Hypertension    followed by pcp  (12-05-2019 pt had ETT 12-11-2011 , in epic, showed normal with no ischemia)   Nephrolithiasis    Panic disorder    PMB (postmenopausal bleeding)    Pre-diabetes    followed by pcp   Seasonal allergies    SUI (stress urinary incontinence, female)    Uterine fibroid    Wears glasses    Past Surgical History:  Procedure Laterality Date   BUNIONECTOMY Bilateral  CESAREAN SECTION  1989   CYSTOSCOPY W/ RETROGRADES Left 06/28/2018   Procedure: CYSTOSCOPY WITH RETROGRADE PYELOGRAM LEFT STENT;  Surgeon: Ceasar Mons, MD;  Location: [facility];  Service: Urology;  Laterality: Left;   CYSTOSCOPY/URETEROSCOPY/HOLMIUM LASER/STENT PLACEMENT Left 07/16/2018   Procedure: CYSTOSCOPY/URETEROSCOPY/HOLMIUM LASER/STENT EXCHANGE;  Surgeon: Ceasar Mons, MD;  Location: [facility];  Service: Urology;  Laterality: Left;   DILATATION & CURETTAGE/HYSTEROSCOPY WITH MYOSURE N/A 12/13/2019   Procedure: DILATATION & CURETTAGE/HYSTEROSCOPY WITH MYOSURE;  Surgeon: Salvadore Dom, MD;  Location: [place];  Service: Gynecology;  Laterality: N/A;   DILITATION & CURRETTAGE/HYSTROSCOPY WITH NOVASURE ABLATION N/A 06/17/2013   Procedure: DILATATION &  CURETTAGE/HYSTEROSCOPY WITH NOVASURE ABLATION;  Surgeon: Osborne Oman, MD;  Location: [facility];  Service: Gynecology;  Laterality: N/A;   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   Procedure: LAPAROSCOPIC ROUX-EN-Y GASTRIC BYPASS WITH UPPER ENDOSCOPY;  Surgeon: Greer Pickerel, MD;  Location: [facility];  Service: General;  Laterality: N/A;   HEMORRHOIDECTOMY WITH HEMORRHOID BANDING  10/19/2012   [facility] Hem ligation/pexy   OPERATIVE ULTRASOUND N/A 12/13/2019   Procedure: OPERATIVE ULTRASOUND;  Surgeon: Salvadore Dom, MD;  Location: [facility];  Service: Gynecology;  Laterality: N/A;   Thumb surgery   04/2021   TUBAL LIGATION Bilateral 2001   UPPER GASTROINTESTINAL ENDOSCOPY  10/11/2020   WISDOM TOOTH EXTRACTION     Family History  Problem Relation Age of Onset   Hypertension Mother    Hypothyroidism Mother    Rectal cancer Mother 55   Colon cancer Mother 65   Colon polyps Mother    Hypotension Sister    Hypothyroidism Sister    Diabetes Sister    Other Sister        pacemaker   Seizures Brother    Hypothyroidism Sister    Seizures Sister    Anemia Sister    Deep vein thrombosis Sister    Seizures Paternal Grandfather    Stroke Maternal Uncle        > 10   Diabetes Maternal Grandmother    Kidney failure Maternal Grandmother    Heart attack Maternal Grandfather    Other Paternal Grandmother        tumors throughout body   Hypertension Maternal Uncle    Esophageal cancer Neg Hx    Stomach cancer Neg Hx    Heart disease Neg Hx    Social History   Socioeconomic History   Marital status: Married    Spouse name: Not on file   Number of children: Not on file   Years of education: Not on file   Highest education level: Not on file  Occupational History   Occupation: minister  Tobacco Use   Smoking status: Light Smoker    Years: 5.00    Types: Cigarettes    Last attempt to quit: 08/05/1999    Years since quitting: 22.9   Smokeless tobacco: Never   Tobacco  comments:    Occasionally  Vaping Use   Vaping Use: Never used  Substance and Sexual Activity   Alcohol use: Yes    Alcohol/week: 10.0 standard drinks of alcohol    Types: 10 Glasses of wine per week   Drug use: No   Sexual activity: Yes    Partners: Male    Birth control/protection: Surgical    Comment: BTL, Ablation  Other Topics Concern   Not on file  Social History Narrative   Not on file   Social Determinants  of Health   Financial Resource Strain: Not on file  Food Insecurity: Not on file  Transportation Needs: Not on file  Physical Activity: Not on file  Stress: Not on file  Social Connections: Not on file   Allergies  Allergen Reactions   Naproxen Other (See Comments)    Panic attacks No Problem with Motrin    Medications  (Not in a hospital admission)    Vitals   Vitals:   06/27/22 2300 06/27/22 2332 06/28/22 0233 06/28/22 0326  BP: (!) 148/108   132/88  Pulse: (!) 107   (!) 103  Resp: 15   (!) 22  Temp:  (!) 101.4 F (38.6 C) 98.6 F (37 C)   TempSrc:  Oral Oral   SpO2: 100%   100%  Weight:      Height:         Body mass index is 29.09 kg/m.  Physical Exam   General: Laying comfortably in bed; in no acute distress.  HENT: Normal oropharynx and mucosa. Normal external appearance of ears and nose.  Neck: Supple, no pain or tenderness  CV: No JVD. No peripheral edema.  Pulmonary: Symmetric Chest rise. Normal respiratory effort.  Abdomen: Soft to touch, non-tender.  Ext: No cyanosis, edema, or deformity  Skin: No rash. Normal palpation of skin.   Musculoskeletal: Normal digits and nails by inspection. No clubbing.   Neurologic Examination  Mental status/Cognition: eyes partially closed and appears foggy in her head, oriented to self, place, month and year, fair attention.  Speech/language: Fluent, comprehension intact, object naming intact, repetition intact.  Cranial nerves:   CN II Pupils equal and reactive to light, no VF deficits     CN III,IV,VI EOM intact, no gaze preference or deviation, no nystagmus    CN V normal sensation in V1, V2, and V3 segments bilaterally    CN VII no asymmetry, no nasolabial fold flattening    CN VIII normal hearing to speech    CN IX & X normal palatal elevation, no uvular deviation    CN XI 5/5 head turn and 5/5 shoulder shrug bilaterally    CN XII midline tongue protrusion    Motor:  Muscle bulk: normal, tone normal, pronator drift none tremor no tremors or asterixis noted. Mvmt Root Nerve  Muscle Right Left Comments  SA C5/6 Ax Deltoid 5 5   EF C5/6 Mc Biceps 5 5   EE C6/7/8 Rad Triceps 5 5   WF C6/7 Med FCR     WE C7/8 PIN ECU     F Ab C8/T1 U ADM/FDI 5 5   HF L1/2/3 Fem Illopsoas 5 5   KE L2/3/4 Fem Quad 5 5   DF L4/5 D Peron Tib Ant 5 5   PF S1/2 Tibial Grc/Sol 5 5    Reflexes:  Right Left Comments  Pectoralis      Biceps (C5/6) 2 2   Brachioradialis (C5/6) 2 2    Triceps (C6/7) 2 2    Patellar (L3/4) 2 2    Achilles (S1)      Hoffman      Plantar     Jaw jerk    Sensation:  Light touch Intact throughout   Pin prick    Temperature    Vibration   Proprioception    Coordination/Complex Motor:  - Finger to Nose intact BL - Heel to shin intact BL - Rapid alternating movement are normal - Gait: deferred given 2 near syncopal episodes.  Labs  CBC:  Recent Labs  Lab 06/27/22 1523 06/28/22 0133  WBC 8.4 6.4  NEUTROABS 6.6  --   HGB 12.6 11.6*  HCT 40.1 36.1  MCV 74.5* 74.9*  PLT 215 025    Basic Metabolic Panel:  Lab Results  Component Value Date   NA 137 06/27/2022   K 3.8 06/27/2022   CO2 24 06/27/2022   GLUCOSE 111 (H) 06/27/2022   BUN 13 06/27/2022   CREATININE 0.95 06/28/2022   CALCIUM 9.5 06/27/2022   GFRNONAA >60 06/28/2022   GFRAA >60 12/13/2019   Lipid Panel:  Lab Results  Component Value Date   LDLCALC 56 12/26/2021   HgbA1c:  Lab Results  Component Value Date   HGBA1C 6.2 12/26/2021   Urine Drug Screen:     Component  Value Date/Time   LABOPIA NONE DETECTED 09/14/2017 1735   COCAINSCRNUR NONE DETECTED 09/14/2017 1735   LABBENZ NONE DETECTED 09/14/2017 1735   AMPHETMU NONE DETECTED 09/14/2017 1735   THCU NONE DETECTED 09/14/2017 1735   LABBARB NONE DETECTED 09/14/2017 1735    Alcohol Level     Component Value Date/Time   ETH <10 06/27/2022 1553    CT Head without contrast(Personally reviewed): CTH was negative for a large hypodensity concerning for a large territory infarct or hyperdensity concerning for an ICH   Impression   JEANI FASSNACHT is a 56 y.o. female with PMH significant for HTN, kidney stones, HTN, prediabetes who presents with episode of lightheadedness and almost passing out along with feeling foggy and loopy in her head, buzzing sensation in her head, diarrhea and random jerks.  She is being treated for colitis noted on the CT A/P.  Witnessed 2 of the jerks and overall, concerning for potential opiod induced myoclinc jerks, non rhythmic and not concerning for seizures. Opiods and flexeril might also be contributing to her difficulty with attention and overall somnolence. Presyncopal episodes could very well be from dehydration in the setting of diarrhea from colitis and unable to keep up with PO intake.  Recommendations  - recommend holding flexeril and opiods. Use lidocaine patch PRN, heating pads for pain to start with. Can start at lower dose once her somnolence and attention is improved. - was considering orthostatic vitals but may not be accurate given she got fluids. - recommend alternative to Cefepime. Cefepime can worsen encephalopathy and cause myoclonus. - serum Ammonia. - if Myoclonic jerks are persistent, let us know. Otherwise, neurology will be available on an as needed basis and not actively following. ______________________________________________________________________   Thank you for the opportunity to take part in the care of this patient. If you have any  further questions, please contact the neurology consultation attending.  Signed,  [place] Pager Number [phone] _ _ _   _ __   _ __ _ _  __ __   _ __   __ _

## 2022-06-29 DIAGNOSIS — E876 Hypokalemia: Secondary | ICD-10-CM | POA: Diagnosis not present

## 2022-06-29 DIAGNOSIS — K529 Noninfective gastroenteritis and colitis, unspecified: Secondary | ICD-10-CM | POA: Diagnosis not present

## 2022-06-29 DIAGNOSIS — G253 Myoclonus: Secondary | ICD-10-CM | POA: Diagnosis not present

## 2022-06-29 DIAGNOSIS — I10 Essential (primary) hypertension: Secondary | ICD-10-CM | POA: Diagnosis not present

## 2022-06-29 LAB — GASTROINTESTINAL PANEL BY PCR, STOOL (REPLACES STOOL CULTURE)

## 2022-06-29 LAB — COMPREHENSIVE METABOLIC PANEL
ALT: 20 U/L (ref 0–44)
AST: 21 U/L (ref 15–41)
Albumin: 2.7 g/dL — ABNORMAL LOW (ref 3.5–5.0)
Alkaline Phosphatase: 62 U/L (ref 38–126)
Anion gap: 8 (ref 5–15)
BUN: 7 mg/dL (ref 6–20)
CO2: 21 mmol/L — ABNORMAL LOW (ref 22–32)
Calcium: 8.9 mg/dL (ref 8.9–10.3)
Chloride: 109 mmol/L (ref 98–111)
Creatinine, Ser: 0.75 mg/dL (ref 0.44–1.00)
GFR, Estimated: >60 mL/min (ref >60)
Glucose, Bld: 157 mg/dL — ABNORMAL HIGH (ref 70–99)
Potassium: 3.2 mmol/L — ABNORMAL LOW (ref 3.5–5.1)
Sodium: 138 mmol/L (ref 135–145)
Total Bilirubin: 0.1 mg/dL — ABNORMAL LOW (ref 0.3–1.2)
Total Protein: 5.3 g/dL — ABNORMAL LOW (ref 6.5–8.1)

## 2022-06-29 LAB — CBC
HCT: 36.7 % (ref 36.0–46.0)
Hemoglobin: 11.2 g/dL — ABNORMAL LOW (ref 12.0–15.0)
MCH: 23.1 pg — ABNORMAL LOW (ref 26.0–34.0)
MCHC: 30.5 g/dL (ref 30.0–36.0)
MCV: 75.7 fL — ABNORMAL LOW (ref 80.0–100.0)
Platelets: 181 10*3/uL (ref 150–400)
RBC: 4.85 MIL/uL (ref 3.87–5.11)
RDW: 14.2 % (ref 11.5–15.5)
WBC: 5.7 10*3/uL (ref 4.0–10.5)
nRBC: 0 % (ref 0.0–0.2)

## 2022-06-29 LAB — URINE CULTURE: Culture: NO GROWTH

## 2022-06-29 LAB — MRSA NEXT GEN BY PCR, NASAL: MRSA by PCR Next Gen: NOT DETECTED

## 2022-06-29 LAB — MAGNESIUM: Magnesium: 1.9 mg/dL (ref 1.7–2.4)

## 2022-06-29 MED ORDER — CIPROFLOXACIN HCL 500 MG PO TABS
500.0000 mg | ORAL_TABLET | Freq: Two times a day (BID) | ORAL | Status: DC
  Administered 2022-06-29 – 2022-07-01 (×4): 500 mg via ORAL
  Filled 2022-06-29 (×4): qty 1

## 2022-06-29 MED ORDER — POTASSIUM CHLORIDE IN NACL 20-0.9 MEQ/L-% IV SOLN
INTRAVENOUS | Status: DC
  Filled 2022-06-29 (×2): qty 1000

## 2022-06-29 MED ORDER — POTASSIUM CHLORIDE CRYS ER 20 MEQ PO TBCR
40.0000 meq | EXTENDED_RELEASE_TABLET | Freq: Once | ORAL | Status: AC
  Administered 2022-06-29: 40 meq via ORAL
  Filled 2022-06-29: qty 2

## 2022-06-29 NOTE — Progress Notes (Addendum)
[facility] PROGRESS NOTE   Mary Sanford RUE:[id number] DOB: [birth year] DOA: 06/27/2022  PCP: [name], MD  Brief History/Interval Summary: 56 y.o. 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  Patient notes she started having lower right sided back pain and followed up with MD 4 days ago at which time she was given oxycodone and flexeril. Patient states  she has taken these medications in the past w/o difficulty. She states around 2 days prior to admission she began to experience diffuse intermittent abdominal pain with associated diarrhea.  Along with the syncopal episode she was also noted to have jerking movements of her head.  There was some concern for seizure activity.  Patient was hospitalized for further management.    Consultants: Neurology  Procedures: None yet    Subjective/Interval History: Patient continues to have watery diarrhea.  She has had about 8 episodes since yesterday morning.  Continues to have some abdominal discomfort.  Denies any nausea vomiting.  Requesting advancement in diet.  Still feels quite weak and lightheaded when she gets out of bed.     Assessment/Plan:  Acute colitis with sepsis present on admission Patient was noted to have fever tachycardia and evidence for colitis on CT scan.  She mentioned having abdominal pain and diarrhea for about 3 days prior to admission.  Unclear if she has had any recent antibiotic use. CT of the abdomen also showed abnormal appearing appendix.  However on examination she is not tender in the right lower quadrant.  Appendicitis seems to be less likely. Stool studies will be ordered including GI pathogen panel and C. difficile.  C. difficile is noted to be negative.  GI  pathogen panel is pending. Continue with vancomycin and ceftriaxone and metronidazole for now.  Follow-up on blood cultures. Continues to have frequent watery diarrhea.  Hopefully will be able to initiate Imodium if her GI pathogen panel is negative.  Near syncope Likely due to hypovolemia.  Monitor on telemetry for now.  Seems to be stable.  Continue IV fluids.  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  They recommend holding Flexeril and opioids.  EEG has not been recommended.  Ammonia level was noted to be normal.  Continue to monitor. Patient noted to have shaking movements when she worked with PT/OT. WIll proceed with MRI brain and EEG to complete workup.  Essential hypertension Blood pressures were quite high when she presented to the hospital.   Seems to have improved with occasional high readings.  Continue to monitor.  Continue Bystolic.    Hypokalemia Will be repleted.  Magnesium is 1.9.  History of anxiety and depression Stable.  Continue Lexapro.  Chronic back pain Lidocaine patch.  History of alcohol abuse  Apparently drinks 1 bottle of wine on a daily basis.  Placed on CIWA protocol.  No evidence for withdrawal at this time.  Normocytic anemia No evidence of overt blood loss.  Continue to monitor.  Uterine fibroids Incidentally noted on CT scan.  Outpatient follow-up.   DVT Prophylaxis: Subcutaneous heparin Code Status: Full code Family Communication: Discussed with the patient and her husband Disposition Plan: Hopefully return home when improved  Status is: Inpatient Remains inpatient appropriate because: Acute colitis      Medications: Scheduled:  cyanocobalamin  1,000  mcg Oral Daily   escitalopram  20 mg Oral Daily   folic acid  1 mg Oral Daily   heparin  5,000 Units Subcutaneous Q8H   lidocaine  1 patch Transdermal Q24H   multivitamin with minerals  1 tablet Oral Daily   nebivolol  5 mg Oral Daily   thiamine  100 mg Oral  Daily   Or   thiamine  100 mg Intravenous Daily   Continuous:  cefTRIAXone (ROCEPHIN)  IV 2 g (06/29/22 0910)   metronidazole 500 mg (06/29/22 0349)   vancomycin 1,250 mg (06/29/22 0459)   ALP:[id number], albuterol, LORazepam **OR** LORazepam, morphine injection, oxyCODONE  Antibiotics: Anti-infectives (From admission, onward)    Start     Dose/Rate Route Frequency Ordered Stop   06/28/22 0800  cefTRIAXone (ROCEPHIN) 2 g in sodium chloride 0.9 % 100 mL IVPB        2 g 200 mL/hr over 30 Minutes Intravenous Every 24 hours 06/28/22 0641     06/28/22 0645  ciprofloxacin (CIPRO) IVPB 400 mg  Status:  Discontinued        400 mg 200 mL/hr over 60 Minutes Intravenous Every 12 hours 06/28/22 0630 06/28/22 0631   06/28/22 0645  ciprofloxacin (CIPRO) IVPB 400 mg  Status:  Discontinued        400 mg 200 mL/hr over 60 Minutes Intravenous Every 12 hours 06/28/22 0631 06/28/22 0641   06/28/22 0400  vancomycin (VANCOREADY) IVPB 1250 mg/250 mL        1,250 mg 166.7 mL/hr over 90 Minutes Intravenous Every 12 hours 06/27/22 1722     06/28/22 0400  metroNIDAZOLE (FLAGYL) IVPB 500 mg        500 mg 100 mL/hr over 60 Minutes Intravenous Every 12 hours 06/28/22 0046     06/28/22 0000  ceFEPIme (MAXIPIME) 2 g in sodium chloride 0.9 % 100 mL IVPB  Status:  Discontinued        2 g 200 mL/hr over 30 Minutes Intravenous Every 8 hours 06/27/22 1722 06/28/22 0631   06/27/22 1600  ceFEPIme (MAXIPIME) 2 g in sodium chloride 0.9 % 100 mL IVPB        2 g 200 mL/hr over 30 Minutes Intravenous  Once 06/27/22 1551 06/27/22 1641   06/27/22 1600  metroNIDAZOLE (FLAGYL) IVPB 500 mg        500 mg 100 mL/hr over 60 Minutes Intravenous  Once 06/27/22 1551 06/27/22 1825   06/27/22 1600  vancomycin (VANCOCIN) IVPB 1000 mg/200 mL premix  Status:  Discontinued        1,000 mg 200 mL/hr over 60 Minutes Intravenous  Once 06/27/22 1551 06/27/22 1553   06/27/22 1600  vancomycin (VANCOREADY) IVPB 1750 mg/350 mL         1,750 mg 175 mL/hr over 120 Minutes Intravenous  Once 06/27/22 1553 06/27/22 2124       Objective:  Vital Signs  Vitals:   06/28/22 2206 06/28/22 2324 06/29/22 0351 06/29/22 0800  BP: (!) 144/91 (!) 145/91 (!) 136/90 132/87  Pulse: 76 82 69 68  Resp:    20  Temp: 98.1 F (36.7 C) 98.3 F (36.8 C) 97.7 F (36.5 C) 97.8 F (36.6 C)  TempSrc: Oral Oral Oral Oral  SpO2: 99% 100% 99%   Weight:      Height:       No intake or output data in the 24 hours ending 06/29/22 0925 Filed Weights   06/27/22 1522  Weight: 89.4 kg    General  appearance: Awake alert.  In no distress Resp: Clear to auscultation bilaterally.  Normal effort Cardio: S1-S2 is normal regular.  No S3-S4.  No rubs murmurs or bruit GI: Abdomen is soft.  Tender mostly in the upper left abdomen and mid abdomen.  No rebound rigidity or guarding.  No masses organomegaly.   Extremities: No edema.  Full range of motion of lower extremities. Neurologic: Alert and oriented x3.  No focal neurological deficits.    Lab Results:  Data Reviewed: I have personally reviewed following labs and reports of the imaging studies  CBC: Recent Labs  Lab 06/27/22 1523 06/28/22 0133 06/28/22 0408 06/29/22 0235  WBC 8.4 6.4 6.7 5.7  NEUTROABS 6.6  --   --   --   HGB 12.6 11.6* 11.0* 11.2*  HCT 40.1 36.1 34.4* 36.7  MCV 74.5* 74.9* 74.1* 75.7*  PLT 215 169 187 181     Basic Metabolic Panel: Recent Labs  Lab 06/27/22 1523 06/28/22 0133 06/28/22 0408 06/29/22 0235  NA 137  --  137 138  K 3.8  --  3.5 3.2*  CL 101  --  103 109  CO2 24  --  24 21*  GLUCOSE 111*  --  117* 157*  BUN 13  --  8 7  CREATININE 0.98 0.95 0.80 0.75  CALCIUM 9.5  --  9.1 8.9  MG  --   --   --  1.9     GFR: Estimated Creatinine Clearance: 94.7 mL/min (by C-G formula based on SCr of 0.75 mg/dL).  Liver Function Tests: Recent Labs  Lab 06/27/22 1523 06/28/22 0408 06/29/22 0235  AST '25 21 21  '$ ALT '24 20 20  '$ ALKPHOS 63 54 62   BILITOT 0.4 0.3 0.1*  PROT 6.4* 5.3* 5.3*  ALBUMIN 3.4* 2.7* 2.7*     Recent Labs  Lab 06/27/22 1554  LIPASE 32    Recent Labs  Lab 06/28/22 0133  AMMONIA 27     Coagulation Profile: Recent Labs  Lab 06/27/22 1523  INR 1.1     Thyroid Function Tests: Recent Labs    06/27/22 1523 06/28/22 0133  TSH  --  0.682  FREET4 0.68  --      Anemia Panel: Recent Labs    06/28/22 0133  FERRITIN 104     Recent Results (from the past 240 hour(s))  Blood Culture (routine x 2)     Status: None (Preliminary result)   Collection Time: 06/27/22  3:50 PM   Specimen: BLOOD  Result Value Ref Range Status   Specimen Description BLOOD RIGHT ANTECUBITAL  Final   Special Requests   Final    BOTTLES DRAWN AEROBIC AND ANAEROBIC Blood Culture results may not be optimal due to an excessive volume of blood received in culture bottles   Culture   Final    NO GROWTH 2 DAYS Performed at [facility] [address], [place], [place] [address]    Report Status PENDING  Incomplete  Blood Culture (routine x 2)     Status: None (Preliminary result)   Collection Time: 06/27/22  3:51 PM   Specimen: BLOOD  Result Value Ref Range Status   Specimen Description BLOOD LEFT ANTECUBITAL  Final   Special Requests   Final    BOTTLES DRAWN AEROBIC AND ANAEROBIC Blood Culture adequate volume   Culture   Final    NO GROWTH 2 DAYS Performed at [facility], [place] [address] [place], [place] [address]  Report Status PENDING  Incomplete  Resp Panel by RT-PCR (Flu A&B, Covid) Anterior Nasal Swab     Status: None   Collection Time: 06/27/22  3:51 PM   Specimen: Anterior Nasal Swab  Result Value Ref Range Status   SARS Coronavirus 2 by RT PCR NEGATIVE NEGATIVE Final    Comment: (NOTE) SARS-CoV-2 target nucleic acids are NOT DETECTED.  The SARS-CoV-2 RNA is generally detectable in upper respiratory specimens during the acute phase of infection. The lowest concentration of  SARS-CoV-2 viral copies this assay can detect is 138 copies/mL. A negative result does not preclude SARS-Cov-2 infection and should not be used as the sole basis for treatment or other patient management decisions. A negative result may occur with  improper specimen collection/handling, submission of specimen other than nasopharyngeal swab, presence of viral mutation(s) within the areas targeted by this assay, and inadequate number of viral copies(<138 copies/mL). A negative result must be combined with clinical observations, patient history, and epidemiological information. The expected result is Negative.  Fact Sheet for Patients:  EntrepreneurPulse.com.au  Fact Sheet for Healthcare Providers:  IncredibleEmployment.be  This test is no t yet approved or cleared by the [place] FDA and  has been authorized for detection and/or diagnosis of SARS-CoV-2 by FDA under an Emergency Use Authorization (EUA). This EUA will remain  in effect (meaning this test can be used) for the duration of the COVID-19 declaration under Section 564(b)(1) of the Act, 21 U.S.C.section 360bbb-3(b)(1), unless the authorization is terminated  or revoked sooner.       Influenza A by PCR NEGATIVE NEGATIVE Final   Influenza B by PCR NEGATIVE NEGATIVE Final    Comment: (NOTE) The Xpert Xpress SARS-CoV-2/FLU/RSV plus assay is intended as an aid in the diagnosis of influenza from Nasopharyngeal swab specimens and should not be used as a sole basis for treatment. Nasal washings and aspirates are unacceptable for Xpert Xpress SARS-CoV-2/FLU/RSV testing.  Fact Sheet for Patients: EntrepreneurPulse.com.au  Fact Sheet for Healthcare Providers: IncredibleEmployment.be  This test is not yet approved or cleared by the [place] FDA and has been authorized for detection and/or diagnosis of SARS-CoV-2 by FDA under an Emergency Use  Authorization (EUA). This EUA will remain in effect (meaning this test can be used) for the duration of the COVID-19 declaration under Section 564(b)(1) of the Act, 21 U.S.C. section 360bbb-3(b)(1), unless the authorization is terminated or revoked.  Performed at [facility], [place] [address], [place], [place] [address]   C Difficile Quick Screen w PCR reflex     Status: None   Collection Time: 06/28/22 10:01 PM   Specimen: STOOL  Result Value Ref Range Status   C Diff antigen NEGATIVE NEGATIVE Final   C Diff toxin NEGATIVE NEGATIVE Final   C Diff interpretation No C. difficile detected.  Final    Comment: Performed at [facility], [place] [address], [place], [place] [address]  MRSA Next Gen by PCR, Nasal     Status: None   Collection Time: 06/28/22 10:02 PM   Specimen: Nasal Mucosa; Nasal Swab  Result Value Ref Range Status   MRSA by PCR Next Gen NOT DETECTED NOT DETECTED Final    Comment: (NOTE) The GeneXpert MRSA Assay (FDA approved for NASAL specimens only), is one component of a comprehensive MRSA colonization surveillance program. It is not intended to diagnose MRSA infection nor to guide or monitor treatment for MRSA infections. Test performance is not FDA approved in patients less than  27 years old. Performed at [facility], [place] [address], [place], [place] [address]       Radiology Studies: CT Angio Chest PE W and/or Wo Contrast  Result Date: 06/27/2022 CLINICAL DATA:  Sepsis EXAM: CT ANGIOGRAPHY CHEST CT ABDOMEN AND PELVIS WITH CONTRAST TECHNIQUE: Multidetector CT imaging of the chest was performed using the standard protocol during bolus administration of intravenous contrast. Multiplanar CT image reconstructions and MIPs were obtained to evaluate the vascular anatomy. Multidetector CT imaging of the abdomen and pelvis was performed using the standard protocol during bolus administration of intravenous contrast. RADIATION DOSE REDUCTION: This exam was  performed according to the departmental dose-optimization program which includes automated exposure control, adjustment of the mA and/or kV according to patient size and/or use of iterative reconstruction technique. CONTRAST:  160m OMNIPAQUE IOHEXOL 350 MG/ML SOLN COMPARISON:  None Available. FINDINGS: CTA CHEST FINDINGS Cardiovascular: No evidence of pulmonary embolus, although bolus timing somewhat limits evaluation of the segmental and subsegmental pulmonary arteries. Normal heart size. No pericardial effusion. Normal caliber thoracic aorta with no atherosclerotic disease. Mediastinum/Nodes: Small hiatal hernia. Thyroid is unremarkable. No pathologically enlarged lymph nodes seen in the chest Lungs/Pleura: Central airways are patent. Mild bibasilar atelectasis no consolidation, pleural effusion or pneumothorax Musculoskeletal: No chest wall abnormality. No acute or significant osseous findings. Review of the MIP images confirms the above findings. CT ABDOMEN and PELVIS FINDINGS Hepatobiliary: No focal liver abnormality is seen. No gallstones, gallbladder wall thickening, or biliary dilatation. Pancreas: Unremarkable. No pancreatic ductal dilatation or surrounding inflammatory changes. Spleen: Normal in size without focal abnormality. Adrenals/Urinary Tract: Bilateral adrenal glands are unremarkable. No hydronephrosis or nephrolithiasis. Bladder is unremarkable. Stomach/Bowel: Postsurgical changes of the stomach. Mild diffuse colonic wall thickening. Mild wall thickening of the proximal appendix with no periappendiceal fat stranding, appendix measures up to 10 mm in diameter. No evidence of obstruction Vascular/Lymphatic: No significant vascular findings are present. No enlarged abdominal or pelvic lymph nodes. Reproductive: Heterogeneous enhancement of the uterus. No adnexal masses. Other: No abdominal wall hernia or abnormality. No abdominopelvic ascites. Musculoskeletal: No acute or significant osseous  findings. Review of the MIP images confirms the above findings. IMPRESSION: 1. No evidence of pulmonary embolus, although bolus timing somewhat limits evaluation of the segmental and subsegmental pulmonary arteries. 2. Mild diffuse colonic wall thickening, finding can be seen in the setting of colitis. 3. Mild wall thickening of the proximal appendix with no periappendiceal fat stranding, likely due to secondary involvement. Correlate for symptoms of right lower quadrant pain. 4. Heterogeneous enhancement of the uterus, likely due to multiple small fibroids fibroids. Finding could be further evaluated with nonemergent pelvic ultrasound. Electronically Signed   By: LYetta GlassmanM.D.   On: 06/27/2022 21:00   CT ABDOMEN PELVIS W CONTRAST  Result Date: 06/27/2022 CLINICAL DATA:  Sepsis EXAM: CT ANGIOGRAPHY CHEST CT ABDOMEN AND PELVIS WITH CONTRAST TECHNIQUE: Multidetector CT imaging of the chest was performed using the standard protocol during bolus administration of intravenous contrast. Multiplanar CT image reconstructions and MIPs were obtained to evaluate the vascular anatomy. Multidetector CT imaging of the abdomen and pelvis was performed using the standard protocol during bolus administration of intravenous contrast. RADIATION DOSE REDUCTION: This exam was performed according to the departmental dose-optimization program which includes automated exposure control, adjustment of the mA and/or kV according to patient size and/or use of iterative reconstruction technique. CONTRAST:  1045mOMNIPAQUE IOHEXOL 350 MG/ML SOLN COMPARISON:  None Available. FINDINGS: CTA CHEST FINDINGS Cardiovascular: No evidence of  pulmonary embolus, although bolus timing somewhat limits evaluation of the segmental and subsegmental pulmonary arteries. Normal heart size. No pericardial effusion. Normal caliber thoracic aorta with no atherosclerotic disease. Mediastinum/Nodes: Small hiatal hernia. Thyroid is unremarkable. No  pathologically enlarged lymph nodes seen in the chest Lungs/Pleura: Central airways are patent. Mild bibasilar atelectasis no consolidation, pleural effusion or pneumothorax Musculoskeletal: No chest wall abnormality. No acute or significant osseous findings. Review of the MIP images confirms the above findings. CT ABDOMEN and PELVIS FINDINGS Hepatobiliary: No focal liver abnormality is seen. No gallstones, gallbladder wall thickening, or biliary dilatation. Pancreas: Unremarkable. No pancreatic ductal dilatation or surrounding inflammatory changes. Spleen: Normal in size without focal abnormality. Adrenals/Urinary Tract: Bilateral adrenal glands are unremarkable. No hydronephrosis or nephrolithiasis. Bladder is unremarkable. Stomach/Bowel: Postsurgical changes of the stomach. Mild diffuse colonic wall thickening. Mild wall thickening of the proximal appendix with no periappendiceal fat stranding, appendix measures up to 10 mm in diameter. No evidence of obstruction Vascular/Lymphatic: No significant vascular findings are present. No enlarged abdominal or pelvic lymph nodes. Reproductive: Heterogeneous enhancement of the uterus. No adnexal masses. Other: No abdominal wall hernia or abnormality. No abdominopelvic ascites. Musculoskeletal: No acute or significant osseous findings. Review of the MIP images confirms the above findings. IMPRESSION: 1. No evidence of pulmonary embolus, although bolus timing somewhat limits evaluation of the segmental and subsegmental pulmonary arteries. 2. Mild diffuse colonic wall thickening, finding can be seen in the setting of colitis. 3. Mild wall thickening of the proximal appendix with no periappendiceal fat stranding, likely due to secondary involvement. Correlate for symptoms of right lower quadrant pain. 4. Heterogeneous enhancement of the uterus, likely due to multiple small fibroids fibroids. Finding could be further evaluated with nonemergent pelvic ultrasound. Electronically  Signed   By: Yetta Glassman M.D.   On: 06/27/2022 21:00   CT L-SPINE NO CHARGE  Result Date: 06/27/2022 CLINICAL DATA:  Patient complains of shaking.  Feeling fatigued. EXAM: CT LUMBAR SPINE WITHOUT CONTRAST TECHNIQUE: Multidetector CT imaging of the lumbar spine was performed without intravenous contrast administration. Multiplanar CT image reconstructions were also generated. RADIATION DOSE REDUCTION: This exam was performed according to the departmental dose-optimization program which includes automated exposure control, adjustment of the mA and/or kV according to patient size and/or use of iterative reconstruction technique. COMPARISON:  None Available. FINDINGS: Segmentation: 5 lumbar type vertebrae. Alignment: Normal. Vertebrae: No acute fracture or focal pathologic process. Paraspinal and other soft tissues: Negative. Disc levels: Mild disc bulge at L4-L5 at L5-S1 without significant spinal canal or foraminal stenosis. Mild facet joint arthropathy at L4-L5 and L5-S1. IMPRESSION: 1. No acute fracture or subluxation. 2. Mild disc bulge at L4-L5 and L5-S1 without significant spinal canal or foraminal stenosis. 3. Mild facet joint arthropathy at L4-L5 and L5-S1. Electronically Signed   By: Keane Police D.O.   On: 06/27/2022 20:46   CT HEAD WO CONTRAST (5MM)  Result Date: 06/27/2022 CLINICAL DATA:  Fatigue. EXAM: CT HEAD WITHOUT CONTRAST TECHNIQUE: Contiguous axial images were obtained from the base of the skull through the vertex without intravenous contrast. RADIATION DOSE REDUCTION: This exam was performed according to the departmental dose-optimization program which includes automated exposure control, adjustment of the mA and/or kV according to patient size and/or use of iterative reconstruction technique. COMPARISON:  August 25, 2018 FINDINGS: Brain: No evidence of acute infarction, hemorrhage, hydrocephalus, extra-axial collection or mass lesion/mass effect. Vascular: No hyperdense vessel or  unexpected calcification. Skull: Normal. Negative for fracture or focal lesion. Sinuses/Orbits: No acute  finding. Other: None. IMPRESSION: No acute intracranial pathology. Electronically Signed   By: Virgina Norfolk M.D.   On: 06/27/2022 20:40   DG Chest Portable 1 View  Result Date: 06/27/2022 CLINICAL DATA:  Fever, sepsis EXAM: PORTABLE CHEST 1 VIEW COMPARISON:  Chest radiograph 06/23/2021 FINDINGS: The cardiomediastinal silhouette is normal. There is no focal consolidation or pulmonary edema. There is no pleural effusion or pneumothorax There is no acute osseous abnormality. IMPRESSION: No radiographic evidence of acute cardiopulmonary process. Electronically Signed   By: Valetta Mole M.D.   On: 06/27/2022 17:33       LOS: 1 day   [facility] Pager on [email or web address]  06/29/2022, 9:25 AM

## 2022-06-29 NOTE — Plan of Care (Signed)

## 2022-06-29 NOTE — Evaluation (Signed)
Physical Therapy Evaluation Patient Details Name: Mary Sanford MRN: [id number] DOB: Mar 15, 1966 Today's Date: 06/29/2022  History of Present Illness  Pt presented to ED 11/24 with sepsis. Pt collapsed on presentation with intermittent shaking. CT head revealed no acute intracranial pathology.  CT spine revealed no significant findings. Pt found to have colitis. Neuro consult for myoclonic jerking and they believed it may be due to opiod induced myoclonic jerks. Recommended holding flexeril and opiods that pt taking for back pain. PMH - chronic back pain, arthritis, HTN, anxiety, depression  Clinical Impression  Pt presents to PT with decr mobility due to intermittent jerking/shaking episodes while mobilizing. Pt with incr jerking/shaking as distance progressed with gait. Brought rollator for pt to sit on and she stiffened and required assist to flex and sit down. Sitting and standing BP were both 130's/90's and pt did not look presyncopal. Per neuro they believe myoclonic jerks may be opiod induced. Pt's symptoms with mobility look like a functional neurological deficit. Will continue to work toward return to independent mobility.       Recommendations for follow up therapy are one component of a multi-disciplinary discharge planning process, led by the attending physician.  Recommendations may be updated based on patient status, additional functional criteria and insurance authorization.  Follow Up Recommendations Outpatient PT      Assistance Recommended at Discharge Frequent or constant Supervision/Assistance  Patient can return home with the following  A little help with walking and/or transfers;A little help with bathing/dressing/bathroom;Help with stairs or ramp for entrance;Assistance with cooking/housework    Equipment Recommendations Other (comment) (To be determined)  Recommendations for Other Services       Functional Status Assessment Patient has had a recent decline in their  functional status and demonstrates the ability to make significant improvements in function in a reasonable and predictable amount of time.     Precautions / Restrictions Precautions Precautions: Fall      Mobility  Bed Mobility Overal bed mobility: Independent                  Transfers Overall transfer level: Needs assistance Equipment used: None Transfers: Sit to/from Stand, Bed to chair/wheelchair/BSC Sit to Stand: Min guard   Step pivot transfers: Min assist       General transfer comment: Min guard assist for safety to stand and min assist for stability and safety    Ambulation/Gait Ambulation/Gait assistance: Min assist, +2 physical assistance Gait Distance (Feet): 40 Feet Assistive device: 1 person hand held assist, 2 person hand held assist Gait Pattern/deviations: Step-through pattern, Decreased step length - right, Decreased step length - left, Shuffle Gait velocity: decr Gait velocity interpretation: <1.31 ft/sec, indicative of household ambulator   General Gait Details: Initially pt 1 person hand held support with slow shuffling gait. After ~25' with intermittent shaking and 2 person hand held assist for stabiity. Shaking/jerking worsened and brought rollator for pt to sit on and returned her to room  Stairs            Wheelchair Mobility    Modified Rankin (Stroke Patients Only)       Balance Overall balance assessment: Needs assistance Sitting-balance support: No upper extremity supported, Feet supported Sitting balance-Leahy Scale: Good     Standing balance support: Single extremity supported, Bilateral upper extremity supported Standing balance-Leahy Scale: Poor Standing balance comment: UE support during jerking/shaking episodes  Pertinent Vitals/Pain Pain Assessment Pain Assessment: Faces Faces Pain Scale: Hurts a little bit Pain Location: back Pain Descriptors / Indicators:  Guarding Pain Intervention(s): Limited activity within patient's tolerance    Home Living Family/patient expects to be discharged to:: Private residence Living Arrangements: Spouse/significant other Available Help at Discharge: Family Type of Home: House       Alternate Level Stairs-Number of Steps: flight Home Layout: Two level;Bed/bath upstairs;1/2 bath on main level Home Equipment: None      Prior Function Prior Level of Function : Independent/Modified Independent;Working/employed;Driving             Mobility Comments: 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 BP 130's/90's sitting and standing    Exercises     Assessment/Plan    PT Assessment Patient needs continued PT services  PT Problem List Decreased balance;Decreased mobility       PT Treatment Interventions DME instruction;Gait training;Stair training;Functional mobility training;Therapeutic activities;Therapeutic exercise;Balance training;Patient/family education    PT Goals (Current goals can be found in the Care Plan section)  Acute Rehab PT Goals Patient Stated Goal: return to prior level PT Goal Formulation: With patient Time For Goal Achievement: 07/13/22 Potential to Achieve Goals: Good    Frequency Min 3X/week     Co-evaluation               AM-PAC PT "6 Clicks" Mobility  Outcome Measure Help needed turning from your back to your side  while in a flat bed without using bedrails?: None Help needed moving from lying on your back to sitting on the side of a flat bed without using bedrails?: None Help needed moving to and from a bed to a chair (including a wheelchair)?: A Little Help needed standing up from a chair using your arms (e.g., wheelchair or bedside chair)?: A Little Help needed to walk in hospital room?: A Lot Help needed climbing 3-5 steps with a railing? : Total 6 Click Score: 17    End of Session Equipment Utilized During Treatment: Gait belt Activity Tolerance: Other (comment) (limited by shaking/jerking) Patient left: in bed;with call bell/phone within reach;with bed alarm set Nurse Communication: Mobility status PT Visit Diagnosis: Other abnormalities of gait and mobility (R26.89);Other symptoms and signs involving the nervous system (R29.898)    Time: 1572-6203 PT Time Calculation (min) (ACUTE ONLY): 13 min   Charges:   PT Evaluation $PT Eval Moderate Complexity: 1 [facility] [place] 06/29/2022, 3:17 PM

## 2022-06-29 NOTE — Plan of Care (Signed)
  Problem: Education: Goal: Knowledge of General Education information will improve Description Including pain rating scale, medication(s)/side effects and non-pharmacologic comfort measures Outcome: Progressing   Problem: Clinical Measurements: Goal: Will remain free from infection Outcome: Progressing   Problem: Activity: Goal: Risk for activity intolerance will decrease Outcome: Progressing   Problem: Nutrition: Goal: Adequate nutrition will be maintained Outcome: Progressing   Problem: Safety: Goal: Ability to remain free from injury will improve Outcome: Progressing   

## 2022-06-29 NOTE — Evaluation (Signed)
Occupational Therapy Evaluation Patient Details Name: EVELENE ROUSSIN MRN: [id number] DOB: [birth year] Today's Date: 06/29/2022   History of Present Illness Pt presented to ED 11/24 with sepsis. Pt collapsed on presentation with intermittent shaking. CT head revealed no acute intracranial pathology.  CT spine revealed no significant findings. Pt found to have colitis. Neuro consult for myoclonic jerking and they believed it may be due to opiod induced myoclonic jerks. Recommended holding flexeril and opiods that pt taking for back pain. PMH - chronic back pain, arthritis, HTN, anxiety, depression   Clinical Impression   PTA, pt lived with her husband and was independent. Upon eval, pt presents with decreased safety with mobility due to intermittent jerking/shaking episodes while mobilizing. Pt with increased jerking/shaking after ambulation to sink and beginning to perform grooming in standing. Pt began to become stiff after several bouts of UE shaking and then required min-mod A to flex and sit down for safety. Pt SBP up to 150s, but reporting she has not had BP medication recently. RN notified. Per neuro, myoclonic jerking could be opioid induced; pt with no change in cognition following jerking episode and continued to be conversational. Will continue to follow to optimize safety with OOB ADL.      Recommendations for follow up therapy are one component of a multi-disciplinary discharge planning process, led by the attending physician.  Recommendations may be updated based on patient status, additional functional criteria and insurance authorization.   Follow Up Recommendations  Outpatient OT     Assistance Recommended at Discharge Frequent or constant Supervision/Assistance (for standing at this time)  Patient can return home with the following A little help with walking and/or transfers;A little help with bathing/dressing/bathroom;Assistance with cooking/housework;Assist for  transportation;Help with stairs or ramp for entrance    Functional Status Assessment  Patient has had a recent decline in their functional status and demonstrates the ability to make significant improvements in function in a reasonable and predictable amount of time.  Equipment Recommendations  BSC/3in1    Recommendations for Other Services       Precautions / Restrictions Precautions Precautions: Fall Restrictions Weight Bearing Restrictions: No      Mobility Bed Mobility Overal bed mobility: Independent                  Transfers Overall transfer level: Needs assistance Equipment used: None Transfers: Sit to/from Stand, Bed to chair/wheelchair/BSC Sit to Stand: Min guard           General transfer comment: Min guard assist for safety to stand and min assist for stability and safety with steps      Balance Overall balance assessment: Needs assistance Sitting-balance support: No upper extremity supported, Feet supported Sitting balance-Leahy Scale: Good     Standing balance support: Single extremity supported, Bilateral upper extremity supported Standing balance-Leahy Scale: Poor Standing balance comment: UE support during jerking/shaking episodes                           ADL either performed or assessed with clinical judgement   ADL Overall ADL's : Needs assistance/impaired Eating/Feeding: Modified independent;Sitting   Grooming: Oral care;Set up;Sitting   Upper Body Bathing: Set up;Sitting   Lower Body Bathing: Min guard;Sit to/from stand   Upper Body Dressing : Set up;Sitting   Lower Body Dressing: Min guard;Sit to/from stand   Toilet Transfer: Min guard;Minimal assistance;Ambulation   Toileting- Clothing Manipulation and Hygiene: Set up;Sitting/lateral lean  Functional mobility during ADLs: Min guard;Minimal assistance General ADL Comments: Min guard A overall for standing tasks, but min to light mod A to sit for safety  with onset of jerking/shaking episodes     Vision Baseline Vision/History: 1 Wears glasses Ability to See in Adequate Light: 0 Adequate Patient Visual Report: No change from baseline Additional Comments: needed glasses to put toothpaste on toothbrush     Perception Perception Perception Tested?: No   Praxis Praxis Praxis tested?: Not tested    Pertinent Vitals/Pain Pain Assessment Pain Assessment: Faces Faces Pain Scale: Hurts a little bit Pain Location: back Pain Descriptors / Indicators: Guarding Pain Intervention(s): Limited activity within patient's tolerance, Monitored during session, Repositioned     Hand Dominance Right   Extremity/Trunk Assessment Upper Extremity Assessment Upper Extremity Assessment: Overall WFL for tasks assessed   Lower Extremity Assessment Lower Extremity Assessment: Defer to PT evaluation       Communication Communication Communication: No difficulties   Cognition Arousal/Alertness: Awake/alert Behavior During Therapy: Anxious Overall Cognitive Status: Within Functional Limits for tasks assessed                                 General Comments: increased time for multistep commands and recall, but overall WFL.     General Comments       Exercises     Shoulder Instructions      Home Living Family/patient expects to be discharged to:: Private residence Living Arrangements: Spouse/significant other Available Help at Discharge: Family Type of Home: House       Home Layout: Two level;Bed/bath upstairs;1/2 bath on main level Alternate Level Stairs-Number of Steps: flight Alternate Level Stairs-Rails: Right           Home Equipment: None          Prior Functioning/Environment Prior Level of Function : Independent/Modified Independent;Working/employed;Driving             Mobility Comments: work full time from home ADLs Comments: reports independent in ADL, IADL, and driving        OT Problem  List: Decreased activity tolerance;Impaired balance (sitting and/or standing);Decreased coordination;Decreased knowledge of use of DME or AE;Pain      OT Treatment/Interventions: Self-care/ADL training;Therapeutic exercise;DME and/or AE instruction;Patient/family education;Balance training;Therapeutic activities    OT Goals(Current goals can be found in the care plan section) Acute Rehab OT Goals Patient Stated Goal: get better and go home OT Goal Formulation: With patient Time For Goal Achievement: 07/13/22 Potential to Achieve Goals: Good  OT Frequency: Min 2X/week    Co-evaluation              AM-PAC OT "6 Clicks" Daily Activity     Outcome Measure Help from another person eating meals?: None Help from another person taking care of personal grooming?: A Little Help from another person toileting, which includes using toliet, bedpan, or urinal?: A Little Help from another person bathing (including washing, rinsing, drying)?: A Little Help from another person to put on and taking off regular upper body clothing?: A Little Help from another person to put on and taking off regular lower body clothing?: A Little 6 Click Score: 19   End of Session Equipment Utilized During Treatment: Gait belt Nurse Communication: Mobility status  Activity Tolerance: Patient tolerated treatment well Patient left: in bed;with bed alarm set;with call bell/phone within reach  OT Visit Diagnosis: Unsteadiness on feet (R26.81);Muscle weakness (generalized) (M62.81);Other symptoms and  signs involving the nervous system (R29.898);Pain Pain - part of body:  (back)                Time: 9371-6967 OT Time Calculation (min): 25 min Charges:  OT General Charges $OT Visit: 1 Visit OT Evaluation $OT Eval Moderate Complexity: 1 Mod OT Treatments $Self Care/Home Management : 8-22 mins  Elder Cyphers, OTR/L [facility] Acute Rehabilitation Office: [phone]   Magnus Ivan 06/29/2022, 5:38 PM

## 2022-06-30 ENCOUNTER — Inpatient Hospital Stay (HOSPITAL_COMMUNITY): Payer: Federal, State, Local not specified - PPO

## 2022-06-30 ENCOUNTER — Ambulatory Visit: Payer: Federal, State, Local not specified - PPO | Admitting: Family Medicine

## 2022-06-30 DIAGNOSIS — G253 Myoclonus: Secondary | ICD-10-CM | POA: Diagnosis not present

## 2022-06-30 DIAGNOSIS — I10 Essential (primary) hypertension: Secondary | ICD-10-CM | POA: Diagnosis not present

## 2022-06-30 DIAGNOSIS — R55 Syncope and collapse: Secondary | ICD-10-CM

## 2022-06-30 DIAGNOSIS — E876 Hypokalemia: Secondary | ICD-10-CM | POA: Diagnosis not present

## 2022-06-30 DIAGNOSIS — K529 Noninfective gastroenteritis and colitis, unspecified: Secondary | ICD-10-CM | POA: Diagnosis not present

## 2022-06-30 LAB — CBC
HCT: 34.5 % — ABNORMAL LOW (ref 36.0–46.0)
Hemoglobin: 10.7 g/dL — ABNORMAL LOW (ref 12.0–15.0)
MCH: 23.3 pg — ABNORMAL LOW (ref 26.0–34.0)
MCHC: 31 g/dL (ref 30.0–36.0)
MCV: 75.2 fL — ABNORMAL LOW (ref 80.0–100.0)
Platelets: 194 10*3/uL (ref 150–400)
RBC: 4.59 MIL/uL (ref 3.87–5.11)
RDW: 14.4 % (ref 11.5–15.5)
WBC: 5.5 10*3/uL (ref 4.0–10.5)
nRBC: 0 % (ref 0.0–0.2)

## 2022-06-30 LAB — COMPREHENSIVE METABOLIC PANEL
ALT: 19 U/L (ref 0–44)
AST: 18 U/L (ref 15–41)
Albumin: 2.5 g/dL — ABNORMAL LOW (ref 3.5–5.0)
Alkaline Phosphatase: 47 U/L (ref 38–126)
Anion gap: 7 (ref 5–15)
BUN: 10 mg/dL (ref 6–20)
CO2: 23 mmol/L (ref 22–32)
Calcium: 8.9 mg/dL (ref 8.9–10.3)
Chloride: 111 mmol/L (ref 98–111)
Creatinine, Ser: 0.71 mg/dL (ref 0.44–1.00)
GFR, Estimated: >60 mL/min (ref >60)
Glucose, Bld: 104 mg/dL — ABNORMAL HIGH (ref 70–99)
Potassium: 4.3 mmol/L (ref 3.5–5.1)
Sodium: 141 mmol/L (ref 135–145)
Total Bilirubin: <0.1 mg/dL — ABNORMAL LOW (ref 0.3–1.2)
Total Protein: 5 g/dL — ABNORMAL LOW (ref 6.5–8.1)

## 2022-06-30 MED ORDER — CLONAZEPAM 0.25 MG PO TBDP
0.2500 mg | ORAL_TABLET | Freq: Two times a day (BID) | ORAL | Status: DC
  Administered 2022-06-30 – 2022-07-01 (×2): 0.25 mg via ORAL
  Filled 2022-06-30 (×2): qty 1

## 2022-06-30 MED ORDER — SACCHAROMYCES BOULARDII 250 MG PO CAPS
250.0000 mg | ORAL_CAPSULE | Freq: Two times a day (BID) | ORAL | Status: DC
  Administered 2022-06-30 – 2022-07-01 (×3): 250 mg via ORAL
  Filled 2022-06-30 (×3): qty 1

## 2022-06-30 MED ORDER — LOPERAMIDE HCL 2 MG PO CAPS: 4.0000 mg | ORAL_CAPSULE | Freq: Three times a day (TID) | ORAL | Status: DC | PRN

## 2022-06-30 NOTE — Progress Notes (Signed)
EEG complete - results pending 

## 2022-06-30 NOTE — Progress Notes (Signed)
[facility] PROGRESS NOTE   DANIALLE DEMENT KLK:[id number] DOB: 01-30-1966 DOA: 06/27/2022  PCP: [name], MD  Brief History/Interval Summary: 56 y.o. 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  Patient notes she started having lower right sided back pain and followed up with MD 4 days ago at which time she was given oxycodone and flexeril. Patient states  she has taken these medications in the past w/o difficulty. She states around 2 days prior to admission she began to experience diffuse intermittent abdominal pain with associated diarrhea.  Along with the syncopal episode she was also noted to have jerking movements of her head.  There was some concern for seizure activity.  Patient was hospitalized for further management.    Consultants: Neurology  Procedures: None yet    Subjective/Interval History: Patient continues to have watery diarrhea.  Abdominal pain has improved.  She feels stronger.  Continues to have these involuntary movements at times.     Assessment/Plan:  Acute colitis with sepsis present on admission/Campylobacter infection Patient was noted to have fever tachycardia and evidence for colitis on CT scan.  She mentioned having abdominal pain and diarrhea for about 3 days prior to admission.  Unclear if she has had any recent antibiotic use. CT of the abdomen also showed abnormal appearing appendix.  However on examination she is not tender in the right lower quadrant.  Appendicitis seems to be less likely. C. difficile PCR was negative.  GI pathogen panel was positive for Campylobacter. Patient was initially started on vancomycin cefepime and metronidazole.  Cefepime was changed over to ceftriaxone. Since we now the  bacteria identified we will have changed her to ciprofloxacin. Start probiotics.  Imodium as needed. Abdominal tenderness has improved.  Near syncope Likely due to hypovolemia.  She was given IV fluids.  Oral intake has improved.  Okay to discontinue IV fluids for now.  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  They recommend holding Flexeril and opioids.  Ammonia level was noted to be normal.   Continues to have symptoms occasionally.  Will proceed with MRI brain and EEG.   She is followed by Dr. Horald Chestnut with neurology for memory loss issues.  Can see him for this reason as well if all workup is unremarkable.  Essential hypertension Blood pressures were quite high when she presented to the hospital.   Seems to have improved with occasional high readings.  Continue to monitor.  Continue Bystolic.    Hypokalemia Repleted.  Magnesium 1.9.  History of anxiety and depression Stable.  Continue Lexapro.  Chronic back pain Lidocaine patch.  History of alcohol abuse  Apparently drinks 1 bottle of wine on a daily basis.  Placed on CIWA protocol.  No evidence for withdrawal at this time.  Normocytic anemia No evidence of overt blood loss.  Continue to monitor.  Uterine fibroids Incidentally noted on CT scan.  Outpatient follow-up.   DVT Prophylaxis: Subcutaneous heparin Code Status: Full code Family Communication: Discussed with the patient and her husband Disposition Plan: Hopefully return home when improved.  Outpatient physical and Occupational Therapy recommended  Status is: Inpatient Remains inpatient appropriate because: Acute colitis      Medications: Scheduled:  ciprofloxacin  500 mg Oral BID   cyanocobalamin  1,000 mcg Oral  Daily   escitalopram  20 mg Oral Daily   folic acid  1 mg Oral Daily   heparin  5,000 Units Subcutaneous Q8H   lidocaine  1 patch Transdermal Q24H   multivitamin with minerals  1 tablet Oral Daily   nebivolol  5 mg Oral  Daily   saccharomyces boulardii  250 mg Oral BID   thiamine  100 mg Oral Daily   Or   thiamine  100 mg Intravenous Daily   Continuous:  0.9 % NaCl with KCl 20 mEq / L 75 mL/hr at 06/29/22 1737   TIW:[id number], albuterol, loperamide, LORazepam **OR** LORazepam, oxyCODONE  Antibiotics: Anti-infectives (From admission, onward)    Start     Dose/Rate Route Frequency Ordered Stop   06/29/22 1700  ciprofloxacin (CIPRO) tablet 500 mg        500 mg Oral 2 times daily 06/29/22 1612     06/28/22 0800  cefTRIAXone (ROCEPHIN) 2 g in sodium chloride 0.9 % 100 mL IVPB  Status:  Discontinued        2 g 200 mL/hr over 30 Minutes Intravenous Every 24 hours 06/28/22 0641 06/29/22 1612   06/28/22 0645  ciprofloxacin (CIPRO) IVPB 400 mg  Status:  Discontinued        400 mg 200 mL/hr over 60 Minutes Intravenous Every 12 hours 06/28/22 0630 06/28/22 0631   06/28/22 0645  ciprofloxacin (CIPRO) IVPB 400 mg  Status:  Discontinued        400 mg 200 mL/hr over 60 Minutes Intravenous Every 12 hours 06/28/22 0631 06/28/22 0641   06/28/22 0400  vancomycin (VANCOREADY) IVPB 1250 mg/250 mL  Status:  Discontinued        1,250 mg 166.7 mL/hr over 90 Minutes Intravenous Every 12 hours 06/27/22 1722 06/29/22 1612   06/28/22 0400  metroNIDAZOLE (FLAGYL) IVPB 500 mg  Status:  Discontinued        500 mg 100 mL/hr over 60 Minutes Intravenous Every 12 hours 06/28/22 0046 06/29/22 1612   06/28/22 0000  ceFEPIme (MAXIPIME) 2 g in sodium chloride 0.9 % 100 mL IVPB  Status:  Discontinued        2 g 200 mL/hr over 30 Minutes Intravenous Every 8 hours 06/27/22 1722 06/28/22 0631   06/27/22 1600  ceFEPIme (MAXIPIME) 2 g in sodium chloride 0.9 % 100 mL IVPB        2 g 200 mL/hr over 30 Minutes Intravenous  Once 06/27/22 1551 06/27/22 1641   06/27/22 1600  metroNIDAZOLE (FLAGYL) IVPB 500 mg        500 mg 100 mL/hr over 60 Minutes Intravenous  Once 06/27/22 1551 06/27/22 1825   06/27/22 1600  vancomycin (VANCOCIN) IVPB  1000 mg/200 mL premix  Status:  Discontinued        1,000 mg 200 mL/hr over 60 Minutes Intravenous  Once 06/27/22 1551 06/27/22 1553   06/27/22 1600  vancomycin (VANCOREADY) IVPB 1750 mg/350 mL        1,750 mg 175 mL/hr over 120 Minutes Intravenous  Once 06/27/22 1553 06/27/22 2124       Objective:  Vital Signs  Vitals:   06/29/22 1732 06/29/22 2016 06/30/22 0000 06/30/22 0400  BP: 131/85 (!) 120/97 116/76 (!) 147/91  Pulse: 73 77 73 66  Resp: '18 16  16  '$ Temp: 98.8 F (37.1 C) 97.7 F (36.5 C) 97.8 F (36.6 C) 97.6 F (36.4 C)  TempSrc: Oral Oral Oral Oral  SpO2: 98% 94% 94% 96%  Weight:  Height:        Intake/Output Summary (Last 24 hours) at 06/30/2022 0947 Last data filed at 06/29/2022 1430 Gross per 24 hour  Intake 195.46 ml  Output --  Net 195.46 ml   Filed Weights   06/27/22 1522  Weight: 89.4 kg    General appearance: Awake alert.  In no distress Resp: Clear to auscultation bilaterally.  Normal effort Cardio: S1-S2 is normal regular.  No S3-S4.  No rubs murmurs or bruit GI: Abdomen is soft.  Ender today compared to yesterday.  No rebound rigidity or guarding.  No masses organomegaly. Extremities: No edema.  Full range of motion of lower extremities. Neurologic: Alert and oriented x3.  No focal neurological deficits.     Lab Results:  Data Reviewed: I have personally reviewed following labs and reports of the imaging studies  CBC: Recent Labs  Lab 06/27/22 1523 06/28/22 0133 06/28/22 0408 06/29/22 0235 06/30/22 0313  WBC 8.4 6.4 6.7 5.7 5.5  NEUTROABS 6.6  --   --   --   --   HGB 12.6 11.6* 11.0* 11.2* 10.7*  HCT 40.1 36.1 34.4* 36.7 34.5*  MCV 74.5* 74.9* 74.1* 75.7* 75.2*  PLT 215 169 187 181 194     Basic Metabolic Panel: Recent Labs  Lab 06/27/22 1523 06/28/22 0133 06/28/22 0408 06/29/22 0235 06/30/22 0313  NA 137  --  137 138 141  K 3.8  --  3.5 3.2* 4.3  CL 101  --  103 109 111  CO2 24  --  24 21* 23  GLUCOSE 111*  --   117* 157* 104*  BUN 13  --  '8 7 10  '$ CREATININE 0.98 0.95 0.80 0.75 0.71  CALCIUM 9.5  --  9.1 8.9 8.9  MG  --   --   --  1.9  --      GFR: Estimated Creatinine Clearance: 94.7 mL/min (by C-G formula based on SCr of 0.71 mg/dL).  Liver Function Tests: Recent Labs  Lab 06/27/22 1523 06/28/22 0408 06/29/22 0235 06/30/22 0313  AST '25 21 21 18  '$ ALT '24 20 20 19  '$ ALKPHOS 63 54 62 47  BILITOT 0.4 0.3 0.1* <0.1*  PROT 6.4* 5.3* 5.3* 5.0*  ALBUMIN 3.4* 2.7* 2.7* 2.5*     Recent Labs  Lab 06/27/22 1554  LIPASE 32    Recent Labs  Lab 06/28/22 0133  AMMONIA 27     Coagulation Profile: Recent Labs  Lab 06/27/22 1523  INR 1.1     Thyroid Function Tests: Recent Labs    06/27/22 1523 06/28/22 0133  TSH  --  0.682  FREET4 0.68  --      Anemia Panel: Recent Labs    06/28/22 0133  FERRITIN 104     Recent Results (from the past 240 hour(s))  Blood Culture (routine x 2)     Status: None (Preliminary result)   Collection Time: 06/27/22  3:50 PM   Specimen: BLOOD  Result Value Ref Range Status   Specimen Description BLOOD RIGHT ANTECUBITAL  Final   Special Requests   Final    BOTTLES DRAWN AEROBIC AND ANAEROBIC Blood Culture results may not be optimal due to an excessive volume of blood received in culture bottles   Culture   Final    NO GROWTH 3 DAYS Performed at [facility], [place] [address] [place], [place] [address]    Report Status PENDING  Incomplete  Blood Culture (routine x 2)  Status: None (Preliminary result)   Collection Time: 06/27/22  3:51 PM   Specimen: BLOOD  Result Value Ref Range Status   Specimen Description BLOOD LEFT ANTECUBITAL  Final   Special Requests   Final    BOTTLES DRAWN AEROBIC AND ANAEROBIC Blood Culture adequate volume   Culture   Final    NO GROWTH 3 DAYS Performed at [facility], [address] [place], [place] [address]    Report Status PENDING  Incomplete  Resp Panel by RT-PCR (Flu A&B, Covid)  Anterior Nasal Swab     Status: None   Collection Time: 06/27/22  3:51 PM   Specimen: Anterior Nasal Swab  Result Value Ref Range Status   SARS Coronavirus 2 by RT PCR NEGATIVE NEGATIVE Final    Comment: (NOTE) SARS-CoV-2 target nucleic acids are NOT DETECTED.  The SARS-CoV-2 RNA is generally detectable in upper respiratory specimens during the acute phase of infection. The lowest concentration of SARS-CoV-2 viral copies this assay can detect is 138 copies/mL. A negative result does not preclude SARS-Cov-2 infection and should not be used as the sole basis for treatment or other patient management decisions. A negative result may occur with  improper specimen collection/handling, submission of specimen other than nasopharyngeal swab, presence of viral mutation(s) within the areas targeted by this assay, and inadequate number of viral copies(<138 copies/mL). A negative result must be combined with clinical observations, patient history, and epidemiological information. The expected result is Negative.  Fact Sheet for Patients:  EntrepreneurPulse.com.au  Fact Sheet for Healthcare Providers:  IncredibleEmployment.be  This test is no t yet approved or cleared by the [place] FDA and  has been authorized for detection and/or diagnosis of SARS-CoV-2 by FDA under an Emergency Use Authorization (EUA). This EUA will remain  in effect (meaning this test can be used) for the duration of the COVID-19 declaration under Section 564(b)(1) of the Act, 21 U.S.C.section 360bbb-3(b)(1), unless the authorization is terminated  or revoked sooner.       Influenza A by PCR NEGATIVE NEGATIVE Final   Influenza B by PCR NEGATIVE NEGATIVE Final    Comment: (NOTE) The Xpert Xpress SARS-CoV-2/FLU/RSV plus assay is intended as an aid in the diagnosis of influenza from Nasopharyngeal swab specimens and should not be used as a sole basis for treatment. Nasal washings  and aspirates are unacceptable for Xpert Xpress SARS-CoV-2/FLU/RSV testing.  Fact Sheet for Patients: EntrepreneurPulse.com.au  Fact Sheet for Healthcare Providers: IncredibleEmployment.be  This test is not yet approved or cleared by the [place] FDA and has been authorized for detection and/or diagnosis of SARS-CoV-2 by FDA under an Emergency Use Authorization (EUA). This EUA will remain in effect (meaning this test can be used) for the duration of the COVID-19 declaration under Section 564(b)(1) of the Act, 21 U.S.C. section 360bbb-3(b)(1), unless the authorization is terminated or revoked.  Performed at [facility], [place] [address] [place], [place] [address]   Urine Culture     Status: None   Collection Time: 06/27/22  7:47 PM   Specimen: In/Out Cath Urine  Result Value Ref Range Status   Specimen Description IN/OUT CATH URINE  Final   Special Requests NONE  Final   Culture   Final    NO GROWTH Performed at [facility], [place] [address] [place], [place] [address]    Report Status 06/29/2022 FINAL  Final  C Difficile Quick Screen w PCR reflex     Status: None  Collection Time: 06/28/22 10:01 PM   Specimen: STOOL  Result Value Ref Range Status   C Diff antigen NEGATIVE NEGATIVE Final   C Diff toxin NEGATIVE NEGATIVE Final   C Diff interpretation No C. difficile detected.  Final    Comment: Performed at [facility], [place] [address] [place], [place] [address]  Gastrointestinal Panel by PCR , Stool     Status: Abnormal   Collection Time: 06/28/22 10:01 PM   Specimen: Stool  Result Value Ref Range Status   Campylobacter species DETECTED (A) NOT DETECTED Final    Comment: RESULT CALLED TO, READ BACK BY AND VERIFIED WITH: Anabel Halon AT 1352 06/29/22.PMF    Plesimonas shigelloides NOT DETECTED NOT DETECTED Final   Salmonella species NOT DETECTED NOT DETECTED Final   Yersinia enterocolitica NOT DETECTED NOT  DETECTED Final   Vibrio species NOT DETECTED NOT DETECTED Final   Vibrio cholerae NOT DETECTED NOT DETECTED Final   Enteroaggregative E coli (EAEC) NOT DETECTED NOT DETECTED Final   Enteropathogenic E coli (EPEC) NOT DETECTED NOT DETECTED Final   Enterotoxigenic E coli (ETEC) NOT DETECTED NOT DETECTED Final   Shiga like toxin producing E coli (STEC) NOT DETECTED NOT DETECTED Final   Shigella/Enteroinvasive E coli (EIEC) NOT DETECTED NOT DETECTED Final   Cryptosporidium NOT DETECTED NOT DETECTED Final   Cyclospora cayetanensis NOT DETECTED NOT DETECTED Final   Entamoeba histolytica NOT DETECTED NOT DETECTED Final   Giardia lamblia NOT DETECTED NOT DETECTED Final   Adenovirus F40/41 NOT DETECTED NOT DETECTED Final   Astrovirus NOT DETECTED NOT DETECTED Final   Norovirus GI/GII NOT DETECTED NOT DETECTED Final   Rotavirus A NOT DETECTED NOT DETECTED Final   Sapovirus (I, II, IV, and V) NOT DETECTED NOT DETECTED Final    Comment: Performed at [facility], [place] [place], [place] [address]  MRSA Next Gen by PCR, Nasal     Status: None   Collection Time: 06/28/22 10:02 PM   Specimen: Nasal Mucosa; Nasal Swab  Result Value Ref Range Status   MRSA by PCR Next Gen NOT DETECTED NOT DETECTED Final    Comment: (NOTE) The GeneXpert MRSA Assay (FDA approved for NASAL specimens only), is one component of a comprehensive MRSA colonization surveillance program. It is not intended to diagnose MRSA infection nor to guide or monitor treatment for MRSA infections. Test performance is not FDA approved in patients less than 71 years old. Performed at [facility], [place] [address], [place], [place] [address]       Radiology Studies: MR BRAIN WO CONTRAST  Result Date: 06/30/2022 CLINICAL DATA:  Seizure, new onset. EXAM: MRI HEAD WITHOUT CONTRAST TECHNIQUE: Multiplanar, multiecho pulse sequences of the brain and surrounding structures were obtained without intravenous contrast.  COMPARISON:  Head CT 06/27/2022.  MRI 10/24/2021. FINDINGS: Brain: Diffusion imaging does not show any acute or subacute infarction or other cause of restricted diffusion. The brainstem and cerebellum are normal. Cerebral hemispheres are normal. No small or large vessel infarction, mass lesion, hemorrhage, hydrocephalus or extra-axial collection. Mesial temporal lobes are symmetric and normal. Insignificant 2.5 mm cyst in the right hippocampus. Vascular: Major vessels at the base of the brain show flow. Skull and upper cervical spine: Negative Sinuses/Orbits: Clear/normal Other: None IMPRESSION: Normal examination. No abnormality seen to explain seizures. Electronically Signed   By: Nelson Chimes M.D.   On: 06/30/2022 09:05       LOS: 2 days   Bonnielee Haff  [facility] Pager on [email or web address]  06/30/2022, 9:47 AM

## 2022-06-30 NOTE — Plan of Care (Signed)

## 2022-06-30 NOTE — Progress Notes (Deleted)
I, Peterson Lombard, LAT, ATC acting as a scribe for Lynne Leader, MD.  Mary Sanford is a 56 y.o. female who presents to [place] at [facility] today for low back pain.  Patient was previously seen by Dr. Georgina Snell on 01/10/2021 for left thumb pain.  Today, patient presents with low back pain ongoing since mid November.  Patient locates pain to  Radiating pain: LE numbness/tingling: LE weakness: Aggravates: Treatments tried:  Pertinent review of systems: ***  Relevant historical information: ***   Exam:  There were no vitals taken for this visit. General: Well Developed, well nourished, and in no acute distress.   MSK: ***    Lab and Radiology Results Results for orders placed or performed during the hospital encounter of 06/27/22 (from the past 72 hour(s))  Comprehensive metabolic panel     Status: Abnormal   Collection Time: 06/27/22  3:23 PM  Result Value Ref Range   Sodium 137 135 - 145 mmol/L   Potassium 3.8 3.5 - 5.1 mmol/L   Chloride 101 98 - 111 mmol/L   CO2 24 22 - 32 mmol/L   Glucose, Bld 111 (H) 70 - 99 mg/dL    Comment: Glucose reference range applies only to samples taken after fasting for at least 8 hours.   BUN 13 6 - 20 mg/dL   Creatinine, Ser 0.98 0.44 - 1.00 mg/dL   Calcium 9.5 8.9 - 10.3 mg/dL   Total Protein 6.4 (L) 6.5 - 8.1 g/dL   Albumin 3.4 (L) 3.5 - 5.0 g/dL   AST 25 15 - 41 U/L   ALT 24 0 - 44 U/L   Alkaline Phosphatase 63 38 - 126 U/L   Total Bilirubin 0.4 0.3 - 1.2 mg/dL   GFR, Estimated >60 >60 mL/min    Comment: (NOTE) Calculated using the CKD-EPI Creatinine Equation (2021)    Anion gap 12 5 - 15    Comment: Performed at [facility], [place] [address] [place], [place] [address]  CBC with Differential     Status: Abnormal   Collection Time: 06/27/22  3:23 PM  Result Value Ref Range   WBC 8.4 4.0 - 10.5 K/uL   RBC 5.38 (H) 3.87 - 5.11 MIL/uL   Hemoglobin 12.6 12.0 - 15.0 g/dL   HCT 40.1 36.0 - 46.0 %   MCV  74.5 (L) 80.0 - 100.0 fL   MCH 23.4 (L) 26.0 - 34.0 pg   MCHC 31.4 30.0 - 36.0 g/dL   RDW 14.2 11.5 - 15.5 %   Platelets 215 150 - 400 K/uL   nRBC 0.0 0.0 - 0.2 %   Neutrophils Relative % 79 %   Neutro Abs 6.6 1.7 - 7.7 K/uL   Lymphocytes Relative 11 %   Lymphs Abs 1.0 0.7 - 4.0 K/uL   Monocytes Relative 9 %   Monocytes Absolute 0.8 0.1 - 1.0 K/uL   Eosinophils Relative 0 %   Eosinophils Absolute 0.0 0.0 - 0.5 K/uL   Basophils Relative 0 %   Basophils Absolute 0.0 0.0 - 0.1 K/uL   Immature Granulocytes 1 %   Abs Immature Granulocytes 0.04 0.00 - 0.07 K/uL    Comment: Performed at [facility], [place] [address], [place], [place] [address]  Protime-INR     Status: None   Collection Time: 06/27/22  3:23 PM  Result Value Ref Range   Prothrombin Time 13.9 11.4 - 15.2 seconds   INR 1.1 0.8 - 1.2  Comment: (NOTE) INR goal varies based on device and disease states. Performed at [facility], [place] [address], [place], [place] [address]   APTT     Status: None   Collection Time: 06/27/22  3:23 PM  Result Value Ref Range   aPTT 26 24 - 36 seconds    Comment: Performed at [facility] [address], [place], [place] [address]  T4, free     Status: None   Collection Time: 06/27/22  3:23 PM  Result Value Ref Range   Free T4 0.68 0.61 - 1.12 ng/dL    Comment: (NOTE) Biotin ingestion may interfere with free T4 tests. If the results are inconsistent with the TSH level, previous test results, or the clinical presentation, then consider biotin interference. If needed, order repeat testing after stopping biotin. Performed at [facility], [place] [address], [place], [place] [address]   Lactic acid, plasma     Status: None   Collection Time: 06/27/22  3:50 PM  Result Value Ref Range   Lactic Acid, Venous 1.7 0.5 - 1.9 mmol/L    Comment: Performed at [facility] [address] [place], [place] [address]  Blood Culture (routine x 2)     Status: None  (Preliminary result)   Collection Time: 06/27/22  3:50 PM   Specimen: BLOOD  Result Value Ref Range   Specimen Description BLOOD RIGHT ANTECUBITAL    Special Requests      BOTTLES DRAWN AEROBIC AND ANAEROBIC Blood Culture results may not be optimal due to an excessive volume of blood received in culture bottles   Culture      NO GROWTH 3 DAYS Performed at [facility] [address] [place], [place] [address]    Report Status PENDING   Blood Culture (routine x 2)     Status: None (Preliminary result)   Collection Time: 06/27/22  3:51 PM   Specimen: BLOOD  Result Value Ref Range   Specimen Description BLOOD LEFT ANTECUBITAL    Special Requests      BOTTLES DRAWN AEROBIC AND ANAEROBIC Blood Culture adequate volume   Culture      NO GROWTH 3 DAYS Performed at [facility], [place] [address] [place],  [address]    Report Status PENDING   Resp Panel by RT-PCR (Flu A&B, Covid) Anterior Nasal Swab     Status: None   Collection Time: 06/27/22  3:51 PM   Specimen: Anterior Nasal Swab  Result Value Ref Range   SARS Coronavirus 2 by RT PCR NEGATIVE NEGATIVE    Comment: (NOTE) SARS-CoV-2 target nucleic acids are NOT DETECTED.  The SARS-CoV-2 RNA is generally detectable in upper respiratory specimens during the acute phase of infection. The lowest concentration of SARS-CoV-2 viral copies this assay can detect is 138 copies/mL. A negative result does not preclude SARS-Cov-2 infection and should not be used as the sole basis for treatment or other patient management decisions. A negative result may occur with  improper specimen collection/handling, submission of specimen other than nasopharyngeal swab, presence of viral mutation(s) within the areas targeted by this assay, and inadequate number of viral copies(<138 copies/mL). A negative result must be combined with clinical observations, patient history, and epidemiological information. The expected result is  Negative.  Fact Sheet for Patients:  EntrepreneurPulse.com.au  Fact Sheet for Healthcare Providers:  IncredibleEmployment.be  This test is no t yet approved or cleared by the [place] and  has been authorized for detection and/or diagnosis of SARS-CoV-2 by FDA under an Emergency Use Authorization (EUA). This EUA will remain  in effect (meaning this test can be used) for the duration of the COVID-19 declaration under Section 564(b)(1) of the Act, 21 U.S.C.section 360bbb-3(b)(1), unless the authorization is terminated  or revoked sooner.       Influenza A by PCR NEGATIVE NEGATIVE   Influenza B by PCR NEGATIVE NEGATIVE    Comment: (NOTE) The Xpert Xpress SARS-CoV-2/FLU/RSV plus assay is intended as an aid in the diagnosis of influenza from Nasopharyngeal swab specimens and should not be used as a sole basis for treatment. Nasal washings and aspirates are unacceptable for Xpert Xpress SARS-CoV-2/FLU/RSV testing.  Fact Sheet for Patients: EntrepreneurPulse.com.au  Fact Sheet for Healthcare Providers: IncredibleEmployment.be  This test is not yet approved or cleared by the [place] FDA and has been authorized for detection and/or diagnosis of SARS-CoV-2 by FDA under an Emergency Use Authorization (EUA). This EUA will remain in effect (meaning this test can be used) for the duration of the COVID-19 declaration under Section 564(b)(1) of the Act, 21 U.S.C. section 360bbb-3(b)(1), unless the authorization is terminated or revoked.  Performed at [facility], [place] [address], [place], [place] [address]   Ethanol     Status: None   Collection Time: 06/27/22  3:53 PM  Result Value Ref Range   Alcohol, Ethyl (B) <10 <10 mg/dL    Comment: (NOTE) Lowest detectable limit for serum alcohol is 10 mg/dL.  For medical purposes only. Performed at [facility], [place] [address]  [place], [place] [address]   Troponin I (High Sensitivity)     Status: None   Collection Time: 06/27/22  3:53 PM  Result Value Ref Range   Troponin I (High Sensitivity) 3 <18 ng/L    Comment: (NOTE) Elevated high sensitivity troponin I (hsTnI) values and significant  changes across serial measurements may suggest ACS but many other  chronic and acute conditions are known to elevate hsTnI results.  Refer to the "Links" section for chest pain algorithms and additional  guidance. Performed at [facility], [place] [address] [place], [place] [address]   Lipase, blood     Status: None   Collection Time: 06/27/22  3:54 PM  Result Value Ref Range   Lipase 32 11 - 51 U/L    Comment: Performed at [facility], [place] [address] [place], [place] [address]  D-dimer, quantitative     Status: Abnormal   Collection Time: 06/27/22  3:54 PM  Result Value Ref Range   D-Dimer, Quant 0.75 (H) 0.00 - 0.50 ug/mL-FEU    Comment: (NOTE) At the manufacturer cut-off value of 0.5 g/mL FEU, this assay has a negative predictive value of 95-100%.This assay is intended for use in conjunction with a clinical pretest probability (PTP) assessment model to exclude pulmonary embolism (PE) and deep venous thrombosis (DVT) in outpatients suspected of PE or DVT. Results should be correlated with clinical presentation. Performed at [facility], [place] [address] [place], [place] [address]   I-Stat beta hCG blood, ED     Status: None   Collection Time: 06/27/22  4:15 PM  Result Value Ref Range   I-stat hCG, quantitative <5.0 <5 mIU/mL   Comment 3            Comment:   GEST. AGE      CONC.  (mIU/mL)   <=1 WEEK  5 - 50     2 WEEKS       50 - 500     3 WEEKS       100 - 10,000     4 WEEKS     1,000 - 30,000        FEMALE AND NON-PREGNANT FEMALE:     LESS THAN 5 mIU/mL   Urinalysis, Routine w reflex microscopic Urine, Clean Catch     Status: Abnormal   Collection Time: 06/27/22  7:47 PM  Result  Value Ref Range   Color, Urine STRAW (A) YELLOW   APPearance CLEAR CLEAR   Specific Gravity, Urine 1.019 1.005 - 1.030   pH 5.0 5.0 - 8.0   Glucose, UA NEGATIVE NEGATIVE mg/dL   Hgb urine dipstick NEGATIVE NEGATIVE   Bilirubin Urine NEGATIVE NEGATIVE   Ketones, ur NEGATIVE NEGATIVE mg/dL   Protein, ur NEGATIVE NEGATIVE mg/dL   Nitrite NEGATIVE NEGATIVE   Leukocytes,Ua NEGATIVE NEGATIVE    Comment: Performed at [facility] [address] [place], [place] [address]  Urine Culture     Status: None   Collection Time: 06/27/22  7:47 PM   Specimen: In/Out Cath Urine  Result Value Ref Range   Specimen Description IN/OUT CATH URINE    Special Requests NONE    Culture      NO GROWTH Performed at [facility], [place] [address], [place], [place] [address]    Report Status 06/29/2022 FINAL   Troponin I (High Sensitivity)     Status: None   Collection Time: 06/27/22  8:39 PM  Result Value Ref Range   Troponin I (High Sensitivity) 7 <18 ng/L    Comment: (NOTE) Elevated high sensitivity troponin I (hsTnI) values and significant  changes across serial measurements may suggest ACS but many other  chronic and acute conditions are known to elevate hsTnI results.  Refer to the "Links" section for chest pain algorithms and additional  guidance. Performed at [facility], [place] [address] [place], [place] [address]   HIV Antibody (routine testing w rflx)     Status: None   Collection Time: 06/28/22  1:33 AM  Result Value Ref Range   HIV Screen 4th Generation wRfx Non Reactive Non Reactive    Comment: Performed at [facility], [place] [address], [place], [place] [address]  CBC     Status: Abnormal   Collection Time: 06/28/22  1:33 AM  Result Value Ref Range   WBC 6.4 4.0 - 10.5 K/uL   RBC 4.82 3.87 - 5.11 MIL/uL   Hemoglobin 11.6 (L) 12.0 - 15.0 g/dL   HCT 36.1 36.0 - 46.0 %   MCV 74.9 (L) 80.0 - 100.0 fL   MCH 24.1 (L) 26.0 - 34.0 pg   MCHC 32.1 30.0 - 36.0 g/dL    RDW 14.3 11.5 - 15.5 %   Platelets 169 150 - 400 K/uL   nRBC 0.0 0.0 - 0.2 %    Comment: Performed at [facility], [place] [address], [place], [place] [address]  Creatinine, serum     Status: None   Collection Time: 06/28/22  1:33 AM  Result Value Ref Range   Creatinine, Ser 0.95 0.44 - 1.00 mg/dL   GFR, Estimated >60 >60 mL/min    Comment: (NOTE) Calculated using the CKD-EPI Creatinine Equation (2021) Performed at [facility] [address] [place], [place] [address]   C-reactive protein  Status: Abnormal   Collection Time: 06/28/22  1:33 AM  Result Value Ref Range   CRP 8.9 (H) <1.0 mg/dL    Comment: Performed at [facility] [address], [place], [place] [address]  Ferritin     Status: None   Collection Time: 06/28/22  1:33 AM  Result Value Ref Range   Ferritin 104 11 - 307 ng/mL    Comment: Performed at [facility], [place] [address] [place], [place] [address]  Procalcitonin - Baseline     Status: None   Collection Time: 06/28/22  1:33 AM  Result Value Ref Range   Procalcitonin <0.10 ng/mL    Comment:        Interpretation: PCT (Procalcitonin) <= 0.5 ng/mL: Systemic infection (sepsis) is not likely. Local bacterial infection is possible. (NOTE)       Sepsis PCT Algorithm           Lower Respiratory Tract                                      Infection PCT Algorithm    ----------------------------     ----------------------------         PCT < 0.25 ng/mL                PCT < 0.10 ng/mL          Strongly encourage             Strongly discourage   discontinuation of antibiotics    initiation of antibiotics    ----------------------------     -----------------------------       PCT 0.25 - 0.50 ng/mL            PCT 0.10 - 0.25 ng/mL               OR       >80% decrease in PCT            Discourage initiation of                                            antibiotics      Encourage discontinuation           of antibiotics     ----------------------------     -----------------------------         PCT >= 0.50 ng/mL              PCT 0.26 - 0.50 ng/mL               AND        <80% decrease in PCT             Encourage initiation of                                             antibiotics       Encourage continuation           of antibiotics    ----------------------------     -----------------------------        PCT >= 0.50 ng/mL  PCT > 0.50 ng/mL               AND         increase in PCT                  Strongly encourage                                      initiation of antibiotics    Strongly encourage escalation           of antibiotics                                     -----------------------------                                           PCT <= 0.25 ng/mL                                                 OR                                        > 80% decrease in PCT                                      Discontinue / Do not initiate                                             antibiotics  Performed at [facility], [address], [place], [place] [address]   Ammonia     Status: None   Collection Time: 06/28/22  1:33 AM  Result Value Ref Range   Ammonia 27 9 - 35 umol/L    Comment: Performed at [facility], [place] [address] [place], [place] [address]  TSH     Status: None   Collection Time: 06/28/22  1:33 AM  Result Value Ref Range   TSH 0.682 0.350 - 4.500 uIU/mL    Comment: Performed by a 3rd Generation assay with a functional sensitivity of <=0.01 uIU/mL. Performed at [facility], [place] [address] [place], [place] [address]   Comprehensive metabolic panel     Status: Abnormal   Collection Time: 06/28/22  4:08 AM  Result Value Ref Range   Sodium 137 135 - 145 mmol/L   Potassium 3.5 3.5 - 5.1 mmol/L   Chloride 103 98 - 111 mmol/L   CO2 24 22 - 32 mmol/L   Glucose, Bld 117 (H) 70 - 99 mg/dL    Comment: Glucose reference range applies only to samples taken  after fasting for at least 8 hours.   BUN 8 6 - 20 mg/dL   Creatinine, Ser 0.80 0.44 - 1.00 mg/dL   Calcium 9.1 8.9 - 10.3  mg/dL   Total Protein 5.3 (L) 6.5 - 8.1 g/dL   Albumin 2.7 (L) 3.5 - 5.0 g/dL   AST 21 15 - 41 U/L   ALT 20 0 - 44 U/L   Alkaline Phosphatase 54 38 - 126 U/L   Total Bilirubin 0.3 0.3 - 1.2 mg/dL   GFR, Estimated >60 >60 mL/min    Comment: (NOTE) Calculated using the CKD-EPI Creatinine Equation (2021)    Anion gap 10 5 - 15    Comment: Performed at [facility] [address], [place], [place] [address]  CBC     Status: Abnormal   Collection Time: 06/28/22  4:08 AM  Result Value Ref Range   WBC 6.7 4.0 - 10.5 K/uL   RBC 4.64 3.87 - 5.11 MIL/uL   Hemoglobin 11.0 (L) 12.0 - 15.0 g/dL   HCT 34.4 (L) 36.0 - 46.0 %   MCV 74.1 (L) 80.0 - 100.0 fL   MCH 23.7 (L) 26.0 - 34.0 pg   MCHC 32.0 30.0 - 36.0 g/dL   RDW 14.3 11.5 - 15.5 %   Platelets 187 150 - 400 K/uL   nRBC 0.0 0.0 - 0.2 %    Comment: Performed at [facility], [place] [address] [place], [place] [address]  C Difficile Quick Screen w PCR reflex     Status: None   Collection Time: 06/28/22 10:01 PM   Specimen: STOOL  Result Value Ref Range   C Diff antigen NEGATIVE NEGATIVE   C Diff toxin NEGATIVE NEGATIVE   C Diff interpretation No C. difficile detected.     Comment: Performed at [facility], [place] [address], [place], [place] [address]  Gastrointestinal Panel by PCR , Stool     Status: Abnormal   Collection Time: 06/28/22 10:01 PM   Specimen: Stool  Result Value Ref Range   Campylobacter species DETECTED (A) NOT DETECTED    Comment: RESULT CALLED TO, READ BACK BY AND VERIFIED WITH: Anabel Halon AT 1352 06/29/22.PMF    Plesimonas shigelloides NOT DETECTED NOT DETECTED   Salmonella species NOT DETECTED NOT DETECTED   Yersinia enterocolitica NOT DETECTED NOT DETECTED   Vibrio species NOT DETECTED NOT DETECTED   Vibrio cholerae NOT DETECTED NOT DETECTED   Enteroaggregative E  coli (EAEC) NOT DETECTED NOT DETECTED   Enteropathogenic E coli (EPEC) NOT DETECTED NOT DETECTED   Enterotoxigenic E coli (ETEC) NOT DETECTED NOT DETECTED   Shiga like toxin producing E coli (STEC) NOT DETECTED NOT DETECTED   Shigella/Enteroinvasive E coli (EIEC) NOT DETECTED NOT DETECTED   Cryptosporidium NOT DETECTED NOT DETECTED   Cyclospora cayetanensis NOT DETECTED NOT DETECTED   Entamoeba histolytica NOT DETECTED NOT DETECTED   Giardia lamblia NOT DETECTED NOT DETECTED   Adenovirus F40/41 NOT DETECTED NOT DETECTED   Astrovirus NOT DETECTED NOT DETECTED   Norovirus GI/GII NOT DETECTED NOT DETECTED   Rotavirus A NOT DETECTED NOT DETECTED   Sapovirus (I, II, IV, and V) NOT DETECTED NOT DETECTED    Comment: Performed at [facility], [place] [place], [place] [address]  MRSA Next Gen by PCR, Nasal     Status: None   Collection Time: 06/28/22 10:02 PM   Specimen: Nasal Mucosa; Nasal Swab  Result Value Ref Range   MRSA by PCR Next Gen NOT DETECTED NOT DETECTED    Comment: (NOTE) The GeneXpert MRSA Assay (FDA approved for NASAL specimens only), is one component of a comprehensive MRSA colonization surveillance program.  It is not intended to diagnose MRSA infection nor to guide or monitor treatment for MRSA infections. Test performance is not FDA approved in patients less than 66 years old. Performed at [facility], [place] [address], [place], [place] [address]   CBC     Status: Abnormal   Collection Time: 06/29/22  2:35 AM  Result Value Ref Range   WBC 5.7 4.0 - 10.5 K/uL   RBC 4.85 3.87 - 5.11 MIL/uL   Hemoglobin 11.2 (L) 12.0 - 15.0 g/dL   HCT 36.7 36.0 - 46.0 %   MCV 75.7 (L) 80.0 - 100.0 fL   MCH 23.1 (L) 26.0 - 34.0 pg   MCHC 30.5 30.0 - 36.0 g/dL   RDW 14.2 11.5 - 15.5 %   Platelets 181 150 - 400 K/uL   nRBC 0.0 0.0 - 0.2 %    Comment: Performed at [facility], [place] [address], [place], [place] [address]  Comprehensive metabolic panel      Status: Abnormal   Collection Time: 06/29/22  2:35 AM  Result Value Ref Range   Sodium 138 135 - 145 mmol/L   Potassium 3.2 (L) 3.5 - 5.1 mmol/L   Chloride 109 98 - 111 mmol/L   CO2 21 (L) 22 - 32 mmol/L   Glucose, Bld 157 (H) 70 - 99 mg/dL    Comment: Glucose reference range applies only to samples taken after fasting for at least 8 hours.   BUN 7 6 - 20 mg/dL   Creatinine, Ser 0.75 0.44 - 1.00 mg/dL   Calcium 8.9 8.9 - 10.3 mg/dL   Total Protein 5.3 (L) 6.5 - 8.1 g/dL   Albumin 2.7 (L) 3.5 - 5.0 g/dL   AST 21 15 - 41 U/L   ALT 20 0 - 44 U/L   Alkaline Phosphatase 62 38 - 126 U/L   Total Bilirubin 0.1 (L) 0.3 - 1.2 mg/dL   GFR, Estimated >60 >60 mL/min    Comment: (NOTE) Calculated using the CKD-EPI Creatinine Equation (2021)    Anion gap 8 5 - 15    Comment: Performed at [facility], [place] [address], [place], [place] [address]  Magnesium     Status: None   Collection Time: 06/29/22  2:35 AM  Result Value Ref Range   Magnesium 1.9 1.7 - 2.4 mg/dL    Comment: Performed at [facility] [address], [place], [place] [address]  CBC     Status: Abnormal   Collection Time: 06/30/22  3:13 AM  Result Value Ref Range   WBC 5.5 4.0 - 10.5 K/uL   RBC 4.59 3.87 - 5.11 MIL/uL   Hemoglobin 10.7 (L) 12.0 - 15.0 g/dL   HCT 34.5 (L) 36.0 - 46.0 %   MCV 75.2 (L) 80.0 - 100.0 fL   MCH 23.3 (L) 26.0 - 34.0 pg   MCHC 31.0 30.0 - 36.0 g/dL   RDW 14.4 11.5 - 15.5 %   Platelets 194 150 - 400 K/uL   nRBC 0.0 0.0 - 0.2 %    Comment: Performed at [facility], [place] [address] [place], [place] [address]  Comprehensive metabolic panel     Status: Abnormal   Collection Time: 06/30/22  3:13 AM  Result Value Ref Range   Sodium 141 135 - 145 mmol/L   Potassium 4.3 3.5 - 5.1 mmol/L   Chloride 111 98 - 111 mmol/L   CO2 23 22 - 32 mmol/L  Glucose, Bld 104 (H) 70 - 99 mg/dL    Comment: Glucose reference range applies only to samples taken after fasting for at least 8 hours.    BUN 10 6 - 20 mg/dL   Creatinine, Ser 0.71 0.44 - 1.00 mg/dL   Calcium 8.9 8.9 - 10.3 mg/dL   Total Protein 5.0 (L) 6.5 - 8.1 g/dL   Albumin 2.5 (L) 3.5 - 5.0 g/dL   AST 18 15 - 41 U/L   ALT 19 0 - 44 U/L   Alkaline Phosphatase 47 38 - 126 U/L   Total Bilirubin <0.1 (L) 0.3 - 1.2 mg/dL   GFR, Estimated >60 >60 mL/min    Comment: (NOTE) Calculated using the CKD-EPI Creatinine Equation (2021)    Anion gap 7 5 - 15    Comment: Performed at [facility], [place] [address] [place], [place] [address]   MR BRAIN WO CONTRAST  Result Date: 06/30/2022 CLINICAL DATA:  Seizure, new onset. EXAM: MRI HEAD WITHOUT CONTRAST TECHNIQUE: Multiplanar, multiecho pulse sequences of the brain and surrounding structures were obtained without intravenous contrast. COMPARISON:  Head CT 06/27/2022.  MRI 10/24/2021. FINDINGS: Brain: Diffusion imaging does not show any acute or subacute infarction or other cause of restricted diffusion. The brainstem and cerebellum are normal. Cerebral hemispheres are normal. No small or large vessel infarction, mass lesion, hemorrhage, hydrocephalus or extra-axial collection. Mesial temporal lobes are symmetric and normal. Insignificant 2.5 mm cyst in the right hippocampus. Vascular: Major vessels at the base of the brain show flow. Skull and upper cervical spine: Negative Sinuses/Orbits: Clear/normal Other: None IMPRESSION: Normal examination. No abnormality seen to explain seizures. Electronically Signed   By: Nelson Chimes M.D.   On: 06/30/2022 09:05   CT Angio Chest PE W and/or Wo Contrast  Result Date: 06/27/2022 CLINICAL DATA:  Sepsis EXAM: CT ANGIOGRAPHY CHEST CT ABDOMEN AND PELVIS WITH CONTRAST TECHNIQUE: Multidetector CT imaging of the chest was performed using the standard protocol during bolus administration of intravenous contrast. Multiplanar CT image reconstructions and MIPs were obtained to evaluate the vascular anatomy. Multidetector CT imaging of the abdomen and  pelvis was performed using the standard protocol during bolus administration of intravenous contrast. RADIATION DOSE REDUCTION: This exam was performed according to the departmental dose-optimization program which includes automated exposure control, adjustment of the mA and/or kV according to patient size and/or use of iterative reconstruction technique. CONTRAST:  15m OMNIPAQUE IOHEXOL 350 MG/ML SOLN COMPARISON:  None Available. FINDINGS: CTA CHEST FINDINGS Cardiovascular: No evidence of pulmonary embolus, although bolus timing somewhat limits evaluation of the segmental and subsegmental pulmonary arteries. Normal heart size. No pericardial effusion. Normal caliber thoracic aorta with no atherosclerotic disease. Mediastinum/Nodes: Small hiatal hernia. Thyroid is unremarkable. No pathologically enlarged lymph nodes seen in the chest Lungs/Pleura: Central airways are patent. Mild bibasilar atelectasis no consolidation, pleural effusion or pneumothorax Musculoskeletal: No chest wall abnormality. No acute or significant osseous findings. Review of the MIP images confirms the above findings. CT ABDOMEN and PELVIS FINDINGS Hepatobiliary: No focal liver abnormality is seen. No gallstones, gallbladder wall thickening, or biliary dilatation. Pancreas: Unremarkable. No pancreatic ductal dilatation or surrounding inflammatory changes. Spleen: Normal in size without focal abnormality. Adrenals/Urinary Tract: Bilateral adrenal glands are unremarkable. No hydronephrosis or nephrolithiasis. Bladder is unremarkable. Stomach/Bowel: Postsurgical changes of the stomach. Mild diffuse colonic wall thickening. Mild wall thickening of the proximal appendix with no periappendiceal fat stranding, appendix measures up to 10 mm in diameter. No evidence of obstruction Vascular/Lymphatic: No significant  vascular findings are present. No enlarged abdominal or pelvic lymph nodes. Reproductive: Heterogeneous enhancement of the uterus. No  adnexal masses. Other: No abdominal wall hernia or abnormality. No abdominopelvic ascites. Musculoskeletal: No acute or significant osseous findings. Review of the MIP images confirms the above findings. IMPRESSION: 1. No evidence of pulmonary embolus, although bolus timing somewhat limits evaluation of the segmental and subsegmental pulmonary arteries. 2. Mild diffuse colonic wall thickening, finding can be seen in the setting of colitis. 3. Mild wall thickening of the proximal appendix with no periappendiceal fat stranding, likely due to secondary involvement. Correlate for symptoms of right lower quadrant pain. 4. Heterogeneous enhancement of the uterus, likely due to multiple small fibroids fibroids. Finding could be further evaluated with nonemergent pelvic ultrasound. Electronically Signed   By: Yetta Glassman M.D.   On: 06/27/2022 21:00   CT ABDOMEN PELVIS W CONTRAST  Result Date: 06/27/2022 CLINICAL DATA:  Sepsis EXAM: CT ANGIOGRAPHY CHEST CT ABDOMEN AND PELVIS WITH CONTRAST TECHNIQUE: Multidetector CT imaging of the chest was performed using the standard protocol during bolus administration of intravenous contrast. Multiplanar CT image reconstructions and MIPs were obtained to evaluate the vascular anatomy. Multidetector CT imaging of the abdomen and pelvis was performed using the standard protocol during bolus administration of intravenous contrast. RADIATION DOSE REDUCTION: This exam was performed according to the departmental dose-optimization program which includes automated exposure control, adjustment of the mA and/or kV according to patient size and/or use of iterative reconstruction technique. CONTRAST:  157m OMNIPAQUE IOHEXOL 350 MG/ML SOLN COMPARISON:  None Available. FINDINGS: CTA CHEST FINDINGS Cardiovascular: No evidence of pulmonary embolus, although bolus timing somewhat limits evaluation of the segmental and subsegmental pulmonary arteries. Normal heart size. No pericardial effusion.  Normal caliber thoracic aorta with no atherosclerotic disease. Mediastinum/Nodes: Small hiatal hernia. Thyroid is unremarkable. No pathologically enlarged lymph nodes seen in the chest Lungs/Pleura: Central airways are patent. Mild bibasilar atelectasis no consolidation, pleural effusion or pneumothorax Musculoskeletal: No chest wall abnormality. No acute or significant osseous findings. Review of the MIP images confirms the above findings. CT ABDOMEN and PELVIS FINDINGS Hepatobiliary: No focal liver abnormality is seen. No gallstones, gallbladder wall thickening, or biliary dilatation. Pancreas: Unremarkable. No pancreatic ductal dilatation or surrounding inflammatory changes. Spleen: Normal in size without focal abnormality. Adrenals/Urinary Tract: Bilateral adrenal glands are unremarkable. No hydronephrosis or nephrolithiasis. Bladder is unremarkable. Stomach/Bowel: Postsurgical changes of the stomach. Mild diffuse colonic wall thickening. Mild wall thickening of the proximal appendix with no periappendiceal fat stranding, appendix measures up to 10 mm in diameter. No evidence of obstruction Vascular/Lymphatic: No significant vascular findings are present. No enlarged abdominal or pelvic lymph nodes. Reproductive: Heterogeneous enhancement of the uterus. No adnexal masses. Other: No abdominal wall hernia or abnormality. No abdominopelvic ascites. Musculoskeletal: No acute or significant osseous findings. Review of the MIP images confirms the above findings. IMPRESSION: 1. No evidence of pulmonary embolus, although bolus timing somewhat limits evaluation of the segmental and subsegmental pulmonary arteries. 2. Mild diffuse colonic wall thickening, finding can be seen in the setting of colitis. 3. Mild wall thickening of the proximal appendix with no periappendiceal fat stranding, likely due to secondary involvement. Correlate for symptoms of right lower quadrant pain. 4. Heterogeneous enhancement of the uterus,  likely due to multiple small fibroids fibroids. Finding could be further evaluated with nonemergent pelvic ultrasound. Electronically Signed   By: LYetta GlassmanM.D.   On: 06/27/2022 21:00   CT L-SPINE NO CHARGE  Result Date: 06/27/2022 CLINICAL DATA:  Patient complains of shaking.  Feeling fatigued. EXAM: CT LUMBAR SPINE WITHOUT CONTRAST TECHNIQUE: Multidetector CT imaging of the lumbar spine was performed without intravenous contrast administration. Multiplanar CT image reconstructions were also generated. RADIATION DOSE REDUCTION: This exam was performed according to the departmental dose-optimization program which includes automated exposure control, adjustment of the mA and/or kV according to patient size and/or use of iterative reconstruction technique. COMPARISON:  None Available. FINDINGS: Segmentation: 5 lumbar type vertebrae. Alignment: Normal. Vertebrae: No acute fracture or focal pathologic process. Paraspinal and other soft tissues: Negative. Disc levels: Mild disc bulge at L4-L5 at L5-S1 without significant spinal canal or foraminal stenosis. Mild facet joint arthropathy at L4-L5 and L5-S1. IMPRESSION: 1. No acute fracture or subluxation. 2. Mild disc bulge at L4-L5 and L5-S1 without significant spinal canal or foraminal stenosis. 3. Mild facet joint arthropathy at L4-L5 and L5-S1. Electronically Signed   By: Keane Police D.O.   On: 06/27/2022 20:46   CT HEAD WO CONTRAST (5MM)  Result Date: 06/27/2022 CLINICAL DATA:  Fatigue. EXAM: CT HEAD WITHOUT CONTRAST TECHNIQUE: Contiguous axial images were obtained from the base of the skull through the vertex without intravenous contrast. RADIATION DOSE REDUCTION: This exam was performed according to the departmental dose-optimization program which includes automated exposure control, adjustment of the mA and/or kV according to patient size and/or use of iterative reconstruction technique. COMPARISON:  August 25, 2018 FINDINGS: Brain: No evidence  of acute infarction, hemorrhage, hydrocephalus, extra-axial collection or mass lesion/mass effect. Vascular: No hyperdense vessel or unexpected calcification. Skull: Normal. Negative for fracture or focal lesion. Sinuses/Orbits: No acute finding. Other: None. IMPRESSION: No acute intracranial pathology. Electronically Signed   By: Virgina Norfolk M.D.   On: 06/27/2022 20:40   DG Chest Portable 1 View  Result Date: 06/27/2022 CLINICAL DATA:  Fever, sepsis EXAM: PORTABLE CHEST 1 VIEW COMPARISON:  Chest radiograph 06/23/2021 FINDINGS: The cardiomediastinal silhouette is normal. There is no focal consolidation or pulmonary edema. There is no pleural effusion or pneumothorax There is no acute osseous abnormality. IMPRESSION: No radiographic evidence of acute cardiopulmonary process. Electronically Signed   By: Valetta Mole M.D.   On: 06/27/2022 17:33       Assessment and Plan: 56 y.o. female with ***   PDMP not reviewed this encounter. No orders of the defined types were placed in this encounter.  No orders of the defined types were placed in this encounter.    Discussed warning signs or symptoms. Please see discharge instructions. Patient expresses understanding.   ***

## 2022-06-30 NOTE — Procedures (Signed)
Patient Name: Mary Sanford  MRN: [id number]  Epilepsy Attending: Lora Havens  Referring Physician/Provider: Bonnielee Haff, MD  Date: 06/30/2022 Duration: 23.46 mins  Patient history: 56yo f with syncope and myoclonus. EEG to evaluate for seizure.  Level of alertness: Awake  AEDs during EEG study: None  Technical aspects: This EEG study was done with scalp electrodes positioned according to the 10-20 International system of electrode placement. Electrical activity was reviewed with band pass filter of 1-'70Hz'$ , sensitivity of 7 uV/mm, display speed of 41m/sec with a '60Hz'$  notched filter applied as appropriate. EEG data were recorded continuously and digitally stored.  Video monitoring was available and reviewed as appropriate.  Description: The posterior dominant rhythm consists of 10 Hz activity of moderate voltage (25-35 uV) seen predominantly in posterior head regions, symmetric and reactive to eye opening and eye closing. Hyperventilation did not show any EEG change.  Physiologic photic driving was seen during photic stimulation.  IMPRESSION: This study is within normal limits. No seizures or epileptiform discharges were seen throughout the recording.  A normal interictal EEG does not exclude  the diagnosis of epilepsy.  [name] OBarbra Sarks

## 2022-06-30 NOTE — Progress Notes (Signed)
Physical Therapy Treatment Patient Details Name: Mary Sanford MRN: [id number] DOB: July 08, 1966 Today's Date: 06/30/2022   History of Present Illness Pt presented to ED 11/24 with sepsis. Pt collapsed on presentation with intermittent shaking. CT head revealed no acute intracranial pathology.  CT spine revealed no significant findings. Pt found to have colitis. Neuro consult for myoclonic jerking and they believed it may be due to opiod induced myoclonic jerks. Recommended holding flexeril and opiods that pt taking for back pain. PMH - chronic back pain, arthritis, HTN, anxiety, depression    PT Comments    Followed up for additional gait training today. Patient still demonstrates abnormal erratic trunk movements with flexion and extension at spontaneous intervals while ambulating. No issues seated. Used RW which did help with stability but required min assist for balance at times. Pt states she can arrange assistance at home between her husband and daughter. If they feel that they will be unable to transport her safely to [place], may consider HHPT initially, but currently able to walk with min assist. Patient will continue to benefit from skilled physical therapy services to further improve independence with functional mobility.    Recommendations for follow up therapy are one component of a multi-disciplinary discharge planning process, led by the attending physician.  Recommendations may be updated based on patient status, additional functional criteria and insurance authorization.  Follow Up Recommendations  Outpatient PT     Assistance Recommended at Discharge Frequent or constant Supervision/Assistance  Patient can return home with the following A little help with walking and/or transfers;A little help with bathing/dressing/bathroom;Help with stairs or ramp for entrance;Assistance with cooking/housework   Equipment Recommendations  Other (comment) (To be determined)    Recommendations  for Other Services       Precautions / Restrictions Precautions Precautions: Fall Restrictions Weight Bearing Restrictions: No     Mobility  Bed Mobility Overal bed mobility: Independent                  Transfers Overall transfer level: Needs assistance Equipment used: None Transfers: Sit to/from Stand Sit to Stand: Supervision           General transfer comment: supervision for safety. Good strength and power-up. No instability upon standing noted.    Ambulation/Gait Ambulation/Gait assistance: Min assist Gait Distance (Feet): 50 Feet Assistive device: Rolling walker (2 wheels) Gait Pattern/deviations: Step-through pattern, Decreased step length - right, Decreased step length - left, Shuffle Gait velocity: decr Gait velocity interpretation: <1.31 ft/sec, indicative of household ambulator   General Gait Details: Initially without AD however pt furniture walking. Provided RW which improved pt stability and confidence for about 20 feet. Then developed abnormal trunk flexion and extension requiring min assist to stabilize. Pt pulling RW into the air leaning backwards. Instructions provided for safe AD use, to keep RW on the floor. Able to walk back to room slowly with intermittent instability. No buckling of LEs.   Stairs Stairs:  (Unsafe at this time due to sudden trunk movements.)           Wheelchair Mobility    Modified Rankin (Stroke Patients Only)       Balance Overall balance assessment: Needs assistance Sitting-balance support: No upper extremity supported, Feet supported Sitting balance-Leahy Scale: Good     Standing balance support: No upper extremity supported Standing balance-Leahy Scale: Fair Standing balance comment: Requires assist when trunk movements occur.  Cognition Arousal/Alertness: Awake/alert Behavior During Therapy: Anxious Overall Cognitive Status: Within Functional Limits for tasks  assessed                                          Exercises      General Comments General comments (skin integrity, edema, etc.): VSS      Pertinent Vitals/Pain Pain Assessment Pain Assessment: Faces Faces Pain Scale: Hurts little more Pain Location: back Pain Descriptors / Indicators: Guarding Pain Intervention(s): Monitored during session, Repositioned    Home Living                          Prior Function            PT Goals (current goals can now be found in the care plan section) Acute Rehab PT Goals Patient Stated Goal: return to prior level PT Goal Formulation: With patient Time For Goal Achievement: 07/13/22 Potential to Achieve Goals: Good Progress towards PT goals: Progressing toward goals    Frequency    Min 3X/week      PT Plan Current plan remains appropriate    Co-evaluation              AM-PAC PT "6 Clicks" Mobility   Outcome Measure  Help needed turning from your back to your side while in a flat bed without using bedrails?: None Help needed moving from lying on your back to sitting on the side of a flat bed without using bedrails?: None Help needed moving to and from a bed to a chair (including a wheelchair)?: A Little Help needed standing up from a chair using your arms (e.g., wheelchair or bedside chair)?: A Little Help needed to walk in hospital room?: A Lot Help needed climbing 3-5 steps with a railing? : Total 6 Click Score: 17    End of Session Equipment Utilized During Treatment: Gait belt Activity Tolerance: Other (comment) (limited by shaking/jerking) Patient left: in bed;with call bell/phone within reach;with bed alarm set   PT Visit Diagnosis: Other abnormalities of gait and mobility (R26.89);Other symptoms and signs involving the nervous system (R29.898)     Time: 4854-6270 PT Time Calculation (min) (ACUTE ONLY): 18 min  Charges:  $Gait Training: 8-22 mins                     Candie Mile, PT, DPT Physical Therapist [facility] [place] 06/30/2022, 12:10 PM

## 2022-06-30 NOTE — Progress Notes (Signed)
Arrived to patient room. Family member in room and notified me that they just took her to MRI. Will try back for EEG at a later time.

## 2022-06-30 NOTE — TOC Initial Note (Signed)
Transition of Care [facility]) - Initial/Assessment Note    Patient Details  Name: Mary Sanford MRN: [id number] Date of Birth: [birth year]  [facility]) CM/SW Contact:    Cyndi Bender, RN Phone Number: 06/30/2022, 2:52 PM  Clinical Narrative:                 Spoke to patient regarding transition needs. Patient agreeable to home health and defers to [facility]  to find highly rated agency. Patient agreeable to use in house provider [facility] for DME needs. Hoyle Sauer with [facility] accepted referral.  Annie Sable with [facility] notified of BSC order BSC will be delivered to the room prior to discharge.  Address, Phone number and PCP verified.  TOC will continue to follow for needs.     Expected Discharge Plan: [place] Barriers to Discharge: Continued Medical Work up   Patient Goals and CMS Choice Patient states their goals for this hospitalization and ongoing recovery are:: return home CMS Medicare.gov Compare Post Acute Care list provided to:: Patient Choice offered to / list presented to : Patient  Expected Discharge Plan and Services Expected Discharge Plan: [place]   Discharge Planning Services: CM Consult Post Acute Care Choice: Durable Medical Equipment Living arrangements for the past 2 months: Single Family Home                 DME Arranged: Bedside commode DME Agency: [facility] Date DME Agency Contacted: 06/30/22 Time DME Agency Contacted: [phone] Representative spoke with at DME Agency: [place]: PT, OT [employer] Agency: [place] Date [place]: 06/30/22 Time [place]: 56 Representative spoke with at [place]: [place] Arrangements/Services Living arrangements for the past 2 months: [place] Lives with:: Spouse Patient language and need for interpreter reviewed:: Yes Do you feel safe going back to the place where you live?: Yes      Need for Family  Participation in Patient Care: Yes (Comment) Care giver support system in place?: Yes (comment)   Criminal Activity/Legal Involvement Pertinent to Current Situation/Hospitalization: No - Comment as needed  Activities of Daily Living Home Assistive Devices/Equipment: None ADL Screening (condition at time of admission) Patient's cognitive ability adequate to safely complete daily activities?: Yes Is the patient deaf or have difficulty hearing?: Yes Does the patient have difficulty seeing, even when wearing glasses/contacts?: No Does the patient have difficulty concentrating, remembering, or making decisions?: No Patient able to express need for assistance with ADLs?: Yes Does the patient have difficulty dressing or bathing?: No Independently performs ADLs?: Yes (appropriate for developmental age) Does the patient have difficulty walking or climbing stairs?: No Weakness of Legs: Both Weakness of Arms/Hands: Both  Permission Sought/Granted         Permission granted to share info w AGENCY: [facility]        Emotional Assessment   Attitude/Demeanor/Rapport: Engaged Affect (typically observed): Accepting Orientation: : Oriented to Self, Oriented to Place, Oriented to  Time, Oriented to Situation Alcohol / Substance Use: Not Applicable Psych Involvement: No (comment)  Admission diagnosis:  Colitis [K52.9] Jerking [R25.3] Syncope, unspecified syncope type [R55] Sepsis without acute organ dysfunction, due to unspecified organism [facility]) [A41.9] Patient Active Problem List   Diagnosis Date Noted   Colitis 06/28/2022   Acute lumbar myofascial strain 06/23/2022   Muscle spasm 06/23/2022   Memory changes 05/05/2022   Primary osteoarthritis involving multiple joints 12/26/2021   Chronic bilateral low  back pain without sciatica 12/26/2021   Pre-diabetes    Mild persistent asthma without complication 51/89/8421   Overweight (BMI 25.0-29.9) 06/14/2020   Status post bariatric surgery 06/16/2019    Vitamin D insufficiency 06/16/2019   Musculoskeletal back pain 06/22/2018   Depression with anxiety 12/28/2015   Allergic rhinitis 01/12/2013   B12 deficiency anemia 01/09/2012   Obesity (BMI 30-39.9) 01/09/2012   Essential hypertension, benign 11/24/2011   PCP:  [name], MD Pharmacy:   [facility] ([place]), [place] - [address] [place]) [place] [address] Phone: [phone] Fax: [phone]     Social Determinants of Health (SDOH) Interventions    Readmission Risk Interventions     No data to display

## 2022-07-01 ENCOUNTER — Other Ambulatory Visit (HOSPITAL_COMMUNITY): Payer: Self-pay

## 2022-07-01 DIAGNOSIS — A045 Campylobacter enteritis: Secondary | ICD-10-CM

## 2022-07-01 LAB — CBC
HCT: 36.2 % (ref 36.0–46.0)
Hemoglobin: 11.2 g/dL — ABNORMAL LOW (ref 12.0–15.0)
MCH: 23.1 pg — ABNORMAL LOW (ref 26.0–34.0)
MCHC: 30.9 g/dL (ref 30.0–36.0)
MCV: 74.8 fL — ABNORMAL LOW (ref 80.0–100.0)
Platelets: 236 10*3/uL (ref 150–400)
RBC: 4.84 MIL/uL (ref 3.87–5.11)
RDW: 14.3 % (ref 11.5–15.5)
WBC: 5 10*3/uL (ref 4.0–10.5)
nRBC: 0 % (ref 0.0–0.2)

## 2022-07-01 LAB — COMPREHENSIVE METABOLIC PANEL
ALT: 28 U/L (ref 0–44)
AST: 31 U/L (ref 15–41)
Albumin: 2.5 g/dL — ABNORMAL LOW (ref 3.5–5.0)
Alkaline Phosphatase: 51 U/L (ref 38–126)
Anion gap: 5 (ref 5–15)
BUN: 7 mg/dL (ref 6–20)
CO2: 25 mmol/L (ref 22–32)
Calcium: 8.9 mg/dL (ref 8.9–10.3)
Chloride: 110 mmol/L (ref 98–111)
Creatinine, Ser: 0.62 mg/dL (ref 0.44–1.00)
GFR, Estimated: >60 mL/min (ref >60)
Glucose, Bld: 109 mg/dL — ABNORMAL HIGH (ref 70–99)
Potassium: 4.3 mmol/L (ref 3.5–5.1)
Sodium: 140 mmol/L (ref 135–145)
Total Bilirubin: 0.1 mg/dL — ABNORMAL LOW (ref 0.3–1.2)
Total Protein: 5.1 g/dL — ABNORMAL LOW (ref 6.5–8.1)

## 2022-07-01 LAB — MAGNESIUM: Magnesium: 2 mg/dL (ref 1.7–2.4)

## 2022-07-01 MED ORDER — CIPROFLOXACIN HCL 500 MG PO TABS
500.0000 mg | ORAL_TABLET | Freq: Two times a day (BID) | ORAL | 0 refills | Status: AC
  Filled 2022-07-01: qty 8, 4d supply, fill #0

## 2022-07-01 MED ORDER — SACCHAROMYCES BOULARDII 250 MG PO CAPS
250.0000 mg | ORAL_CAPSULE | Freq: Two times a day (BID) | ORAL | 0 refills | Status: AC
  Filled 2022-07-01: qty 28, 14d supply, fill #0

## 2022-07-01 MED ORDER — CLONAZEPAM 0.25 MG PO TBDP
0.2500 mg | ORAL_TABLET | Freq: Two times a day (BID) | ORAL | 0 refills | Status: DC
  Filled 2022-07-01: qty 60, 30d supply, fill #0

## 2022-07-01 MED ORDER — LOPERAMIDE HCL 2 MG PO CAPS
4.0000 mg | ORAL_CAPSULE | Freq: Three times a day (TID) | ORAL | 0 refills | Status: DC | PRN
  Filled 2022-07-01: qty 30, 5d supply, fill #0

## 2022-07-01 NOTE — TOC Transition Note (Signed)
Transition of Care [facility]) - CM/SW Discharge Note   Patient Details  Name: Mary Sanford MRN: [id number] Date of Birth: 1966/06/29  Transition of Care [facility]) CM/SW Contact:  Carles Collet, RN Phone Number: 07/01/2022, 10:28 AM   Clinical Narrative:     Notified carolyn w [facility] [place] that patient will DC today. Spoke w patient and verified that bedside commode has been delivered to her. She requested a RW for home as well. Spoke w Cyril Mourning w [facility] and requested it be delivered to her room ASAP as DC order is written. No other TOC needs identified.   Final next level of care: [place] Barriers to Discharge: No Barriers Identified   Patient Goals and CMS Choice Patient states their goals for this hospitalization and ongoing recovery are:: return home CMS Medicare.gov Compare Post Acute Care list provided to:: Patient Choice offered to / list presented to : Patient  Discharge Placement                       Discharge Plan and Services   Discharge Planning Services: CM Consult Post Acute Care Choice: Durable Medical Equipment          DME Arranged: Gilford Rile rolling DME Agency: [facility] Date DME Agency Contacted: 07/01/22 Time DME Agency Contacted: 0315 Representative spoke with at DME Agency: [place]: PT, OT [place] Agency: [place] Date [place]: 07/01/22 Time [place]: 1028 Representative spoke with at [place]: [place] Determinants of Health ([place]) Interventions     Readmission Risk Interventions     No data to display

## 2022-07-01 NOTE — Discharge Summary (Signed)
[facility]  Physician Discharge Summary   Patient ID: Mary Sanford MRN: [id number] DOB/AGE: [birth year] 56 y.o.  Admit date: 06/27/2022 Discharge date:   07/01/2022   PCP: [name], MD  DISCHARGE DIAGNOSES:  Acute gastroenteritis/colitis secondary to Campylobacter Myoclonus of unclear etiology Essential hypertension   RECOMMENDATIONS FOR OUTPATIENT FOLLOW UP: Will send to neurology for outpatient follow-up.  She has previously been seen by Dr. April Manson.    Home Health: PT and OT Equipment/Devices: Walker  CODE STATUS: Full code  DISCHARGE CONDITION: fair  Diet recommendation: As before  INITIAL HISTORY: 56 y.o. 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  Patient notes she started having lower right sided back pain and followed up with MD 4 days ago at which time she was given oxycodone and flexeril. Patient states  she has taken these medications in the past w/o difficulty. She states around 2 days prior to admission she began to experience diffuse intermittent abdominal pain with associated diarrhea.  Along with the syncopal episode she was also noted to have jerking movements of her head.  There was some concern for seizure activity.  Patient was hospitalized for further management.     Consultants: Neurology   Procedures: EEG    HOSPITAL COURSE:   Acute colitis with sepsis present on admission/Campylobacter infection Patient was noted to have fever tachycardia and evidence for colitis on CT scan.  She mentioned having abdominal pain and diarrhea for about 3 days prior to admission.  Unclear if she has had any recent antibiotic use. CT of the abdomen also showed abnormal appearing appendix.  However on examination  she is not tender in the right lower quadrant.  Appendicitis seems to be less likely. C. difficile PCR was negative.  GI pathogen panel was positive for Campylobacter. Patient was initially given broad-spectrum antibiotics.  Once bacteria was identified.  She was changed over to ciprofloxacin.  Probiotics.  Imodium as needed.  Abdominal tenderness has improved.  Diarrhea is improving.   Near syncope Likely due to hypovolemia.  She was given IV fluids.  Has ambulated without any presyncopal symptoms.   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  They recommend holding Flexeril and opioids.  Ammonia level was noted to be normal.   Continues to have symptoms occasionally.  MRI brain and not show any acute findings.  EEG was unremarkable as well. She is followed by Dr. Horald Chestnut with neurology for memory loss issues.  Referral sent to his office for evaluation. Will be discharged on twice a day of clonazepam for now.  Symptoms have improved. Seen by physical therapy.  Home health has been ordered.   Essential hypertension Continue home medications.   Hypokalemia Repleted.  Magnesium 1.9.   History of anxiety and depression Stable.  Continue Lexapro.   Chronic back pain Lidocaine patch.   History of alcohol abuse  Apparently drinks 1 bottle of wine on a daily basis.  Placed on CIWA protocol.  No evidence for withdrawal at this time.   Normocytic anemia No evidence of overt blood loss.  Continue to monitor.   Uterine fibroids Incidentally noted on CT scan.  Previous history of same per previous CT scan reports.  Outpatient follow-up.   Patient has improved.  Feels better.  Okay for discharge  home today.   PERTINENT LABS:  The results of significant diagnostics from this hospitalization (including imaging, microbiology, ancillary and laboratory) are listed below for reference.    Microbiology: Recent Results (from the past 240 hour(s))  Blood Culture (routine x 2)      Status: None (Preliminary result)   Collection Time: 06/27/22  3:50 PM   Specimen: BLOOD  Result Value Ref Range Status   Specimen Description BLOOD RIGHT ANTECUBITAL  Final   Special Requests   Final    BOTTLES DRAWN AEROBIC AND ANAEROBIC Blood Culture results may not be optimal due to an excessive volume of blood received in culture bottles   Culture   Final    NO GROWTH 4 DAYS Performed at [facility], [place] [address] [place], [place] [address]    Report Status PENDING  Incomplete  Blood Culture (routine x 2)     Status: None (Preliminary result)   Collection Time: 06/27/22  3:51 PM   Specimen: BLOOD  Result Value Ref Range Status   Specimen Description BLOOD LEFT ANTECUBITAL  Final   Special Requests   Final    BOTTLES DRAWN AEROBIC AND ANAEROBIC Blood Culture adequate volume   Culture   Final    NO GROWTH 4 DAYS Performed at [facility], [place] [address], [place], [place] [address]    Report Status PENDING  Incomplete  Resp Panel by RT-PCR (Flu A&B, Covid) Anterior Nasal Swab     Status: None   Collection Time: 06/27/22  3:51 PM   Specimen: Anterior Nasal Swab  Result Value Ref Range Status   SARS Coronavirus 2 by RT PCR NEGATIVE NEGATIVE Final    Comment: (NOTE) SARS-CoV-2 target nucleic acids are NOT DETECTED.  The SARS-CoV-2 RNA is generally detectable in upper respiratory specimens during the acute phase of infection. The lowest concentration of SARS-CoV-2 viral copies this assay can detect is 138 copies/mL. A negative result does not preclude SARS-Cov-2 infection and should not be used as the sole basis for treatment or other patient management decisions. A negative result may occur with  improper specimen collection/handling, submission of specimen other than nasopharyngeal swab, presence of viral mutation(s) within the areas targeted by this assay, and inadequate number of viral copies(<138 copies/mL). A negative result must be combined  with clinical observations, patient history, and epidemiological information. The expected result is Negative.  Fact Sheet for Patients:  EntrepreneurPulse.com.au  Fact Sheet for Healthcare Providers:  IncredibleEmployment.be  This test is no t yet approved or cleared by the [place] FDA and  has been authorized for detection and/or diagnosis of SARS-CoV-2 by FDA under an Emergency Use Authorization (EUA). This EUA will remain  in effect (meaning this test can be used) for the duration of the COVID-19 declaration under Section 564(b)(1) of the Act, 21 U.S.C.section 360bbb-3(b)(1), unless the authorization is terminated  or revoked sooner.       Influenza A by PCR NEGATIVE NEGATIVE Final   Influenza B by PCR NEGATIVE NEGATIVE Final    Comment: (NOTE) The Xpert Xpress SARS-CoV-2/FLU/RSV plus assay is intended as an aid in the diagnosis of influenza from Nasopharyngeal swab specimens and should not be used as a sole basis for treatment. Nasal washings and aspirates are unacceptable for Xpert Xpress SARS-CoV-2/FLU/RSV testing.  Fact Sheet for Patients: EntrepreneurPulse.com.au  Fact Sheet for Healthcare Providers: IncredibleEmployment.be  This test is not yet approved or cleared by the [place] FDA and has been authorized for detection and/or  diagnosis of SARS-CoV-2 by FDA under an Emergency Use Authorization (EUA). This EUA will remain in effect (meaning this test can be used) for the duration of the COVID-19 declaration under Section 564(b)(1) of the Act, 21 U.S.C. section 360bbb-3(b)(1), unless the authorization is terminated or revoked.  Performed at [facility], [place] [address], [place], [place] [address]   Urine Culture     Status: None   Collection Time: 06/27/22  7:47 PM   Specimen: In/Out Cath Urine  Result Value Ref Range Status   Specimen Description IN/OUT CATH URINE  Final    Special Requests NONE  Final   Culture   Final    NO GROWTH Performed at [facility], [place] [address], [place], [place] [address]    Report Status 06/29/2022 FINAL  Final  C Difficile Quick Screen w PCR reflex     Status: None   Collection Time: 06/28/22 10:01 PM   Specimen: STOOL  Result Value Ref Range Status   C Diff antigen NEGATIVE NEGATIVE Final   C Diff toxin NEGATIVE NEGATIVE Final   C Diff interpretation No C. difficile detected.  Final    Comment: Performed at [facility], [place] [address], [place], [place] [address]  Gastrointestinal Panel by PCR , Stool     Status: Abnormal   Collection Time: 06/28/22 10:01 PM   Specimen: Stool  Result Value Ref Range Status   Campylobacter species DETECTED (A) NOT DETECTED Final    Comment: RESULT CALLED TO, READ BACK BY AND VERIFIED WITH: Anabel Halon AT 1352 06/29/22.PMF    Plesimonas shigelloides NOT DETECTED NOT DETECTED Final   Salmonella species NOT DETECTED NOT DETECTED Final   Yersinia enterocolitica NOT DETECTED NOT DETECTED Final   Vibrio species NOT DETECTED NOT DETECTED Final   Vibrio cholerae NOT DETECTED NOT DETECTED Final   Enteroaggregative E coli (EAEC) NOT DETECTED NOT DETECTED Final   Enteropathogenic E coli (EPEC) NOT DETECTED NOT DETECTED Final   Enterotoxigenic E coli (ETEC) NOT DETECTED NOT DETECTED Final   Shiga like toxin producing E coli (STEC) NOT DETECTED NOT DETECTED Final   Shigella/Enteroinvasive E coli (EIEC) NOT DETECTED NOT DETECTED Final   Cryptosporidium NOT DETECTED NOT DETECTED Final   Cyclospora cayetanensis NOT DETECTED NOT DETECTED Final   Entamoeba histolytica NOT DETECTED NOT DETECTED Final   Giardia lamblia NOT DETECTED NOT DETECTED Final   Adenovirus F40/41 NOT DETECTED NOT DETECTED Final   Astrovirus NOT DETECTED NOT DETECTED Final   Norovirus GI/GII NOT DETECTED NOT DETECTED Final   Rotavirus A NOT DETECTED NOT DETECTED Final   Sapovirus (I, II, IV, and V) NOT  DETECTED NOT DETECTED Final    Comment: Performed at [facility], [place] [place], [place] [address]  MRSA Next Gen by PCR, Nasal     Status: None   Collection Time: 06/28/22 10:02 PM   Specimen: Nasal Mucosa; Nasal Swab  Result Value Ref Range Status   MRSA by PCR Next Gen NOT DETECTED NOT DETECTED Final    Comment: (NOTE) The GeneXpert MRSA Assay (FDA approved for NASAL specimens only), is one component of a comprehensive MRSA colonization surveillance program. It is not intended to diagnose MRSA infection nor to guide or monitor treatment for MRSA infections. Test performance is not FDA approved in patients less than 50 years old. Performed at [facility], [place] [address] [place],  [address]      Labs:   Basic Metabolic  Panel: Recent Labs  Lab 06/27/22 1523 06/28/22 0133 06/28/22 0408 06/29/22 0235 07/05/22 0313 07/01/22 0605  NA 137  --  137 138 141 140  K 3.8  --  3.5 3.2* 4.3 4.3  CL 101  --  103 109 111 110  CO2 24  --  24 21* 23 25  GLUCOSE 111*  --  117* 157* 104* 109*  BUN 13  --  '8 7 10 7  '$ CREATININE 0.98 0.95 0.80 0.75 0.71 0.62  CALCIUM 9.5  --  9.1 8.9 8.9 8.9  MG  --   --   --  1.9  --  2.0   Liver Function Tests: Recent Labs  Lab 06/27/22 1523 06/28/22 0408 06/29/22 0235 Jul 05, 2022 0313 07/01/22 0605  AST '25 21 21 18 31  '$ ALT '24 20 20 19 28  '$ ALKPHOS 63 54 62 47 51  BILITOT 0.4 0.3 0.1* <0.1* 0.1*  PROT 6.4* 5.3* 5.3* 5.0* 5.1*  ALBUMIN 3.4* 2.7* 2.7* 2.5* 2.5*   Recent Labs  Lab 06/27/22 1554  LIPASE 32   Recent Labs  Lab 06/28/22 0133  AMMONIA 27   CBC: Recent Labs  Lab 06/27/22 1523 06/28/22 0133 06/28/22 0408 06/29/22 0235 07/05/2022 0313 07/01/22 0605  WBC 8.4 6.4 6.7 5.7 5.5 5.0  NEUTROABS 6.6  --   --   --   --   --   HGB 12.6 11.6* 11.0* 11.2* 10.7* 11.2*  HCT 40.1 36.1 34.4* 36.7 34.5* 36.2  MCV 74.5* 74.9* 74.1* 75.7* 75.2* 74.8*  PLT 215 169 187 181 194 236    IMAGING STUDIES EEG  adult  Result Date: 07/05/22 Lora Havens, MD     07/05/2022 10:59 AM Patient Name: DYONNA JASPERS MRN: [id number] Epilepsy Attending: Lora Havens Referring Physician/Provider: Bonnielee Haff, MD Date: July 05, 2022 Duration: 23.46 mins Patient history: 56yo f with syncope and myoclonus. EEG to evaluate for seizure. Level of alertness: Awake AEDs during EEG study: None Technical aspects: This EEG study was done with scalp electrodes positioned according to the 10-20 International system of electrode placement. Electrical activity was reviewed with band pass filter of 1-'70Hz'$ , sensitivity of 7 uV/mm, display speed of 80m/sec with a '60Hz'$  notched filter applied as appropriate. EEG data were recorded continuously and digitally stored.  Video monitoring was available and reviewed as appropriate. Description: The posterior dominant rhythm consists of 10 Hz activity of moderate voltage (25-35 uV) seen predominantly in posterior head regions, symmetric and reactive to eye opening and eye closing. Hyperventilation did not show any EEG change.  Physiologic photic driving was seen during photic stimulation. IMPRESSION: This study is within normal limits. No seizures or epileptiform discharges were seen throughout the recording. A normal interictal EEG does not exclude  the diagnosis of epilepsy. PLora Havens  MR BRAIN WO CONTRAST  Result Date: 112/02/2023CLINICAL DATA:  Seizure, new onset. EXAM: MRI HEAD WITHOUT CONTRAST TECHNIQUE: Multiplanar, multiecho pulse sequences of the brain and surrounding structures were obtained without intravenous contrast. COMPARISON:  Head CT 06/27/2022.  MRI 10/24/2021. FINDINGS: Brain: Diffusion imaging does not show any acute or subacute infarction or other cause of restricted diffusion. The brainstem and cerebellum are normal. Cerebral hemispheres are normal. No small or large vessel infarction, mass lesion, hemorrhage, hydrocephalus or extra-axial collection. Mesial  temporal lobes are symmetric and normal. Insignificant 2.5 mm cyst in the right hippocampus. Vascular: Major vessels at the base of the brain show flow. Skull and upper cervical spine: Negative Sinuses/Orbits: Clear/normal  Other: None IMPRESSION: Normal examination. No abnormality seen to explain seizures. Electronically Signed   By: Nelson Chimes M.D.   On: 06/30/2022 09:05   CT Angio Chest PE W and/or Wo Contrast  Result Date: 06/27/2022 CLINICAL DATA:  Sepsis EXAM: CT ANGIOGRAPHY CHEST CT ABDOMEN AND PELVIS WITH CONTRAST TECHNIQUE: Multidetector CT imaging of the chest was performed using the standard protocol during bolus administration of intravenous contrast. Multiplanar CT image reconstructions and MIPs were obtained to evaluate the vascular anatomy. Multidetector CT imaging of the abdomen and pelvis was performed using the standard protocol during bolus administration of intravenous contrast. RADIATION DOSE REDUCTION: This exam was performed according to the departmental dose-optimization program which includes automated exposure control, adjustment of the mA and/or kV according to patient size and/or use of iterative reconstruction technique. CONTRAST:  130m OMNIPAQUE IOHEXOL 350 MG/ML SOLN COMPARISON:  None Available. FINDINGS: CTA CHEST FINDINGS Cardiovascular: No evidence of pulmonary embolus, although bolus timing somewhat limits evaluation of the segmental and subsegmental pulmonary arteries. Normal heart size. No pericardial effusion. Normal caliber thoracic aorta with no atherosclerotic disease. Mediastinum/Nodes: Small hiatal hernia. Thyroid is unremarkable. No pathologically enlarged lymph nodes seen in the chest Lungs/Pleura: Central airways are patent. Mild bibasilar atelectasis no consolidation, pleural effusion or pneumothorax Musculoskeletal: No chest wall abnormality. No acute or significant osseous findings. Review of the MIP images confirms the above findings. CT ABDOMEN and PELVIS  FINDINGS Hepatobiliary: No focal liver abnormality is seen. No gallstones, gallbladder wall thickening, or biliary dilatation. Pancreas: Unremarkable. No pancreatic ductal dilatation or surrounding inflammatory changes. Spleen: Normal in size without focal abnormality. Adrenals/Urinary Tract: Bilateral adrenal glands are unremarkable. No hydronephrosis or nephrolithiasis. Bladder is unremarkable. Stomach/Bowel: Postsurgical changes of the stomach. Mild diffuse colonic wall thickening. Mild wall thickening of the proximal appendix with no periappendiceal fat stranding, appendix measures up to 10 mm in diameter. No evidence of obstruction Vascular/Lymphatic: No significant vascular findings are present. No enlarged abdominal or pelvic lymph nodes. Reproductive: Heterogeneous enhancement of the uterus. No adnexal masses. Other: No abdominal wall hernia or abnormality. No abdominopelvic ascites. Musculoskeletal: No acute or significant osseous findings. Review of the MIP images confirms the above findings. IMPRESSION: 1. No evidence of pulmonary embolus, although bolus timing somewhat limits evaluation of the segmental and subsegmental pulmonary arteries. 2. Mild diffuse colonic wall thickening, finding can be seen in the setting of colitis. 3. Mild wall thickening of the proximal appendix with no periappendiceal fat stranding, likely due to secondary involvement. Correlate for symptoms of right lower quadrant pain. 4. Heterogeneous enhancement of the uterus, likely due to multiple small fibroids fibroids. Finding could be further evaluated with nonemergent pelvic ultrasound. Electronically Signed   By: LYetta GlassmanM.D.   On: 06/27/2022 21:00   CT ABDOMEN PELVIS W CONTRAST  Result Date: 06/27/2022 CLINICAL DATA:  Sepsis EXAM: CT ANGIOGRAPHY CHEST CT ABDOMEN AND PELVIS WITH CONTRAST TECHNIQUE: Multidetector CT imaging of the chest was performed using the standard protocol during bolus administration of  intravenous contrast. Multiplanar CT image reconstructions and MIPs were obtained to evaluate the vascular anatomy. Multidetector CT imaging of the abdomen and pelvis was performed using the standard protocol during bolus administration of intravenous contrast. RADIATION DOSE REDUCTION: This exam was performed according to the departmental dose-optimization program which includes automated exposure control, adjustment of the mA and/or kV according to patient size and/or use of iterative reconstruction technique. CONTRAST:  1031mOMNIPAQUE IOHEXOL 350 MG/ML SOLN COMPARISON:  None Available. FINDINGS: CTA CHEST FINDINGS Cardiovascular:  No evidence of pulmonary embolus, although bolus timing somewhat limits evaluation of the segmental and subsegmental pulmonary arteries. Normal heart size. No pericardial effusion. Normal caliber thoracic aorta with no atherosclerotic disease. Mediastinum/Nodes: Small hiatal hernia. Thyroid is unremarkable. No pathologically enlarged lymph nodes seen in the chest Lungs/Pleura: Central airways are patent. Mild bibasilar atelectasis no consolidation, pleural effusion or pneumothorax Musculoskeletal: No chest wall abnormality. No acute or significant osseous findings. Review of the MIP images confirms the above findings. CT ABDOMEN and PELVIS FINDINGS Hepatobiliary: No focal liver abnormality is seen. No gallstones, gallbladder wall thickening, or biliary dilatation. Pancreas: Unremarkable. No pancreatic ductal dilatation or surrounding inflammatory changes. Spleen: Normal in size without focal abnormality. Adrenals/Urinary Tract: Bilateral adrenal glands are unremarkable. No hydronephrosis or nephrolithiasis. Bladder is unremarkable. Stomach/Bowel: Postsurgical changes of the stomach. Mild diffuse colonic wall thickening. Mild wall thickening of the proximal appendix with no periappendiceal fat stranding, appendix measures up to 10 mm in diameter. No evidence of obstruction  Vascular/Lymphatic: No significant vascular findings are present. No enlarged abdominal or pelvic lymph nodes. Reproductive: Heterogeneous enhancement of the uterus. No adnexal masses. Other: No abdominal wall hernia or abnormality. No abdominopelvic ascites. Musculoskeletal: No acute or significant osseous findings. Review of the MIP images confirms the above findings. IMPRESSION: 1. No evidence of pulmonary embolus, although bolus timing somewhat limits evaluation of the segmental and subsegmental pulmonary arteries. 2. Mild diffuse colonic wall thickening, finding can be seen in the setting of colitis. 3. Mild wall thickening of the proximal appendix with no periappendiceal fat stranding, likely due to secondary involvement. Correlate for symptoms of right lower quadrant pain. 4. Heterogeneous enhancement of the uterus, likely due to multiple small fibroids fibroids. Finding could be further evaluated with nonemergent pelvic ultrasound. Electronically Signed   By: Yetta Glassman M.D.   On: 06/27/2022 21:00   CT L-SPINE NO CHARGE  Result Date: 06/27/2022 CLINICAL DATA:  Patient complains of shaking.  Feeling fatigued. EXAM: CT LUMBAR SPINE WITHOUT CONTRAST TECHNIQUE: Multidetector CT imaging of the lumbar spine was performed without intravenous contrast administration. Multiplanar CT image reconstructions were also generated. RADIATION DOSE REDUCTION: This exam was performed according to the departmental dose-optimization program which includes automated exposure control, adjustment of the mA and/or kV according to patient size and/or use of iterative reconstruction technique. COMPARISON:  None Available. FINDINGS: Segmentation: 5 lumbar type vertebrae. Alignment: Normal. Vertebrae: No acute fracture or focal pathologic process. Paraspinal and other soft tissues: Negative. Disc levels: Mild disc bulge at L4-L5 at L5-S1 without significant spinal canal or foraminal stenosis. Mild facet joint arthropathy at  L4-L5 and L5-S1. IMPRESSION: 1. No acute fracture or subluxation. 2. Mild disc bulge at L4-L5 and L5-S1 without significant spinal canal or foraminal stenosis. 3. Mild facet joint arthropathy at L4-L5 and L5-S1. Electronically Signed   By: Keane Police D.O.   On: 06/27/2022 20:46   CT HEAD WO CONTRAST (5MM)  Result Date: 06/27/2022 CLINICAL DATA:  Fatigue. EXAM: CT HEAD WITHOUT CONTRAST TECHNIQUE: Contiguous axial images were obtained from the base of the skull through the vertex without intravenous contrast. RADIATION DOSE REDUCTION: This exam was performed according to the departmental dose-optimization program which includes automated exposure control, adjustment of the mA and/or kV according to patient size and/or use of iterative reconstruction technique. COMPARISON:  August 25, 2018 FINDINGS: Brain: No evidence of acute infarction, hemorrhage, hydrocephalus, extra-axial collection or mass lesion/mass effect. Vascular: No hyperdense vessel or unexpected calcification. Skull: Normal. Negative for fracture or focal lesion. Sinuses/Orbits:  No acute finding. Other: None. IMPRESSION: No acute intracranial pathology. Electronically Signed   By: Virgina Norfolk M.D.   On: 06/27/2022 20:40   DG Chest Portable 1 View  Result Date: 06/27/2022 CLINICAL DATA:  Fever, sepsis EXAM: PORTABLE CHEST 1 VIEW COMPARISON:  Chest radiograph 06/23/2021 FINDINGS: The cardiomediastinal silhouette is normal. There is no focal consolidation or pulmonary edema. There is no pleural effusion or pneumothorax There is no acute osseous abnormality. IMPRESSION: No radiographic evidence of acute cardiopulmonary process. Electronically Signed   By: Valetta Mole M.D.   On: 06/27/2022 17:33   DG Lumbar Spine 2-3 Views  Result Date: 06/23/2022 CLINICAL DATA:  Lumbar pain.  No known injury. EXAM: LUMBAR SPINE - 2-3 VIEW COMPARISON:  CT AP 03/14/2022 FINDINGS: There is no evidence of lumbar spine fracture. Alignment is normal.  Intervertebral disc spaces are maintained. IMPRESSION: Negative. Electronically Signed   By: Kerby Moors M.D.   On: 06/23/2022 08:39    DISCHARGE EXAMINATION: Vitals:   06/30/22 2000 06/30/22 2009 06/30/22 2300 07/01/22 0300  BP: (!) 154/92 (!) 154/92 118/74 137/88  Pulse: 91 91 70 66  Resp:  '16 18 18  '$ Temp:  98.9 F (37.2 C) (!) 97.5 F (36.4 C) 98 F (36.7 C)  TempSrc:  Oral Oral Oral  SpO2:  99% 98% 100%  Weight:      Height:       General appearance: Awake alert.  In no distress Resp: Clear to auscultation bilaterally.  Normal effort Cardio: S1-S2 is normal regular.  No S3-S4.  No rubs murmurs or bruit GI: Abdomen is soft.  Nontender nondistended.  Bowel sounds are present normal.  No masses organomegaly   DISPOSITION: Home  Discharge Instructions     Ambulatory referral to Neurology   Complete by: As directed    An appointment is requested in approximately: 4 weeks for myoclonus   Call MD for:  difficulty breathing, headache or visual disturbances   Complete by: As directed    Call MD for:  extreme fatigue   Complete by: As directed    Call MD for:  persistant dizziness or light-headedness   Complete by: As directed    Call MD for:  persistant nausea and vomiting   Complete by: As directed    Call MD for:  severe uncontrolled pain   Complete by: As directed    Call MD for:  temperature >100.4   Complete by: As directed    Diet - low sodium heart healthy   Complete by: As directed    Discharge instructions   Complete by: As directed    Please take your medications as prescribed.  I am holding one of your blood pressure medicines for now.  You may talk to your primary care provider at follow-up regarding resumption.  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  Seek attention if your symptoms worsen.  Stay well-hydrated at home.  You were cared for by a hospitalist during your hospital stay. If you have any questions about your discharge  medications or the care you received while you were in the hospital after you are discharged, you can call the unit and asked to speak with the hospitalist on call if the hospitalist that took care of you is not available. Once you are discharged, your primary care physician will handle any further medical issues. Please note that NO REFILLS for any discharge medications will be authorized once you are discharged, as it is imperative  that you return to your primary care physician (or establish a relationship with a primary care physician if you do not have one) for your aftercare needs so that they can reassess your need for medications and monitor your lab values. If you do not have a primary care physician, you can call [phone] for a physician referral.   Increase activity slowly   Complete by: As directed           Allergies as of 07/01/2022       Reactions   Naproxen Other (See Comments)   Panic attacks No Problem with Motrin        Medication List     STOP taking these medications    cyclobenzaprine 10 MG tablet Commonly known as: FLEXERIL   oxyCODONE-acetaminophen 5-325 MG tablet Commonly known as: PERCOCET/ROXICET   triamterene-hydrochlorothiazide 37.5-25 MG tablet Commonly known as: MAXZIDE-25       TAKE these medications    acetaminophen 500 MG tablet Commonly known as: TYLENOL Take 500-1,000 mg by mouth as needed for headache or moderate pain.   albuterol 108 (90 Base) MCG/ACT inhaler Commonly known as: VENTOLIN HFA Inhale 1-2 puffs into the lungs every 6 (six) hours as needed for wheezing or shortness of breath (for seasonal allergies). What changed: when to take this   APPLE CIDER VINEGAR PO Take 2 tablets by mouth daily.   ciprofloxacin 500 MG tablet Commonly known as: CIPRO Take 1 tablet (500 mg total) by mouth 2 (two) times daily for 4 days.   clonazePAM 0.25 MG disintegrating tablet Commonly known as: KLONOPIN Take 1 tablet (0.25 mg total) by  mouth 2 (two) times daily.   cyanocobalamin 1000 MCG tablet Commonly known as: VITAMIN B12 Take 1 tablet (1,000 mcg total) by mouth daily.   escitalopram 20 MG tablet Commonly known as: LEXAPRO Take 1 tablet by mouth once daily   ibuprofen 200 MG tablet Commonly known as: ADVIL Take 600 mg by mouth every 6 (six) hours as needed for headache, mild pain or moderate pain.   loperamide 2 MG capsule Commonly known as: IMODIUM Take 2 capsules (4 mg total) by mouth 3 (three) times daily as needed for diarrhea or loose stools.   nebivolol 5 MG tablet Commonly known as: BYSTOLIC Take 1 tablet (5 mg total) by mouth daily.   phentermine 37.5 MG capsule Take 1 capsule (37.5 mg total) by mouth every morning.   saccharomyces boulardii 250 MG capsule Commonly known as: FLORASTOR Take 1 capsule (250 mg total) by mouth 2 (two) times daily for 14 days.   Vitamin D3 25 MCG (1000 UT) Chew Chew 2 tablets by mouth daily.               Durable Medical Equipment  (From admission, onward)           Start     Ordered   07/01/22 1023  For home use only DME Walker rolling  Once       Question Answer Comment  Walker: With [place] Wheels   Patient needs a walker to treat with the following condition Weakness      07/01/22 1022   06/30/22 1447  For home use only DME Bedside commode  Once       Question:  Patient needs a bedside commode to treat with the following condition  Answer:  Weakness   06/30/22 1446              Follow-up Information  [facility].   Why: [facility] has been arranged. they will contact you to schedule apt within 48hours post discharge. Contact information: [address] [address] [phone]         [name], MD. Schedule an appointment as soon as possible for a visit in 1 week(s).   Specialty: Internal Medicine Why: post hospitalization follow up Contact information: [place] [address] [phone]         Alric Ran, MD Follow up.   Specialty: Neurology Why: for further evaluation of the jerking movements Contact information: [address]  [address] [phone]                 TOTAL DISCHARGE TIME: 35 minutes.  Keirra Zeimet [employer]  [employer] operational officer on [email or web address]  07/01/2022, 11:00 AM

## 2022-07-01 NOTE — Progress Notes (Signed)
Patient discharged per md orders. Patient stable at discharge with no indications of acute distress noted. Vitals stable. All patient belongings with patient at discharge. Reviewed AVS/discharge instructions, medications, follow up with patient, allowed time for questions, patient verbalizes understanding. Hard copy of discharge instructions provided to patient along with work note. TOC pharmacy medications delivered to patient and in patient possession at discharge, witnessed medication delivery by Adelfa Koh, RN. Patient accompanied by loved one at discharge.

## 2022-07-02 ENCOUNTER — Telehealth: Payer: Self-pay

## 2022-07-02 ENCOUNTER — Encounter: Payer: Self-pay | Admitting: Internal Medicine

## 2022-07-02 DIAGNOSIS — F1010 Alcohol abuse, uncomplicated: Secondary | ICD-10-CM | POA: Insufficient documentation

## 2022-07-02 DIAGNOSIS — G253 Myoclonus: Secondary | ICD-10-CM | POA: Insufficient documentation

## 2022-07-02 LAB — CULTURE, BLOOD (ROUTINE X 2)
Culture: NO GROWTH
Culture: NO GROWTH
Special Requests: ADEQUATE

## 2022-07-02 LAB — NOROVIRUS GROUP 1 & 2 BY PCR, STOOL
Norovirus 1 by PCR: NEGATIVE
Norovirus 2  by PCR: NEGATIVE

## 2022-07-02 NOTE — Telephone Encounter (Signed)
Transition Care Management Unsuccessful Follow-up Telephone Call  Date of discharge and from where:  [facility] 07-01-22 Dx: acute gastroenteritis/colitis  Attempts:  1st Attempt  Reason for unsuccessful TCM follow-up call:  Left voice message   Juanda Crumble LPN [place] Direct Dial [phone]

## 2022-07-02 NOTE — Progress Notes (Unsigned)
Subjective:    Patient ID: Mary Sanford, female    DOB: 1966-04-19, 56 y.o.   MRN: [id number]     HPI Mary Sanford is here for follow up from hospital - went for bacterial infection in colon  Admitted 11/24 - 11/28  Presented with fever, near syncope, abdominal pain associated with diarrhea x 2 days.  Her symptoms started with right sided back pain which was initially thought to be msk in nature.  She did have some jerking movement of her head so there was some concern of possible seizure.   Acute colitis with sepsis - campylobacter infection -  Had fever, tachycardia, colitis on Ct scan Ct abdomen showed abnormal appearing appendix. 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 - they advised holding flexeril and opiods Ammonia level noted to be normal Continued to have symptoms occasionally MRI brain - no acute findings, EEG normal Will follow up with neuro - Dr Horald Chestnut Discharged on clonazepam bid Symptoms improved Seen by PT - Home health ordered  Htn -  Continued home meds  Hypokalemia: Replaed  Anxiety, depression -  Cotninued on home lexapro  Chronic back pain -  Lidocaine patch  etoh abuse Drinks 1 bottle of wine daily No evidence of withdrawal in hospital  Normocytic anemia  No blood loss Monitor  Uterine fibroids Seen on previous CT   Medications and allergies reviewed with patient and updated if appropriate.  Current Outpatient Medications on File Prior to Visit  Medication Sig Dispense Refill   acetaminophen (TYLENOL) 500 MG tablet Take 500-1,000 mg by mouth as needed for headache or moderate pain.     albuterol (VENTOLIN HFA) 108 (90 Base) MCG/ACT inhaler Inhale 1-2 puffs into the lungs every 6  (six) hours as needed for wheezing or shortness of breath (for seasonal allergies). (Patient taking differently: Inhale 1-2 puffs into the lungs as needed for wheezing or shortness of breath (for seasonal allergies).) 18 g 3   APPLE CIDER VINEGAR PO Take 2 tablets by mouth daily.     Cholecalciferol (VITAMIN D3) 25 MCG (1000 UT) CHEW Chew 2 tablets by mouth daily.     ciprofloxacin (CIPRO) 500 MG tablet Take 1 tablet (500 mg total) by mouth 2 (two) times daily for 4 days. 8 tablet 0   clonazePAM (KLONOPIN) 0.25 MG disintegrating tablet Take 1 tablet (0.25 mg total) by mouth 2 (two) times daily. 60 tablet 0   cyanocobalamin (VITAMIN B12) 1000 MCG tablet Take 1 tablet (1,000 mcg total) by mouth daily. 30 tablet 11   escitalopram (LEXAPRO) 20 MG tablet Take 1 tablet by mouth once daily 90 tablet 0   ibuprofen (ADVIL) 200 MG tablet Take 600 mg by mouth every 6 (six) hours as needed for headache, mild pain or moderate pain.     loperamide (IMODIUM) 2 MG capsule Take 2 capsules (4 mg total) by mouth 3 (three) times daily as needed for diarrhea or loose stools. 30 capsule 0   nebivolol (BYSTOLIC) 5 MG tablet Take 1 tablet (5 mg total) by mouth daily. 90 tablet 0   phentermine 37.5 MG capsule Take 1 capsule (37.5 mg total) by mouth every morning. 30 capsule 0   saccharomyces boulardii (FLORASTOR) 250 MG capsule Take 1 capsule (250 mg total)  by mouth 2 (two) times daily for 14 days. 28 capsule 0   [DISCONTINUED] potassium chloride (K-DUR) 10 MEQ tablet Take 1 tablet (10 mEq total) by mouth 2 (two) times daily. 60 tablet 11   No current facility-administered medications on file prior to visit.     Review of Systems     Objective:  There were no vitals filed for this visit. BP Readings from Last 3 Encounters:  07/01/22 136/83  06/23/22 (!) 140/90  06/17/22 (!) 155/101   Wt Readings from Last 3 Encounters:  06/27/22 197 lb (89.4 kg)  06/23/22 199 lb (90.3 kg)  06/17/22 198 lb (89.8 kg)   There  is no height or weight on file to calculate BMI.    Physical Exam     Lab Results  Component Value Date   WBC 5.0 07/01/2022   HGB 11.2 (L) 07/01/2022   HCT 36.2 07/01/2022   PLT 236 07/01/2022   GLUCOSE 109 (H) 07/01/2022   CHOL 142 12/26/2021   TRIG 122.0 12/26/2021   HDL 61.60 12/26/2021   LDLCALC 56 12/26/2021   ALT 28 07/01/2022   AST 31 07/01/2022   NA 140 07/01/2022   K 4.3 07/01/2022   CL 110 07/01/2022   CREATININE 0.62 07/01/2022   BUN 7 07/01/2022   CO2 25 07/01/2022   TSH 0.682 06/28/2022   INR 1.1 06/27/2022   HGBA1C 6.2 12/26/2021    EEG adult Lora Havens, MD     06/30/2022 10:59 AM Patient Name: Mary Sanford  MRN: [id number]  Epilepsy Attending: Lora Havens  Referring Physician/Provider: Bonnielee Haff, MD  Date: 06/30/2022 Duration: 23.46 mins  Patient history: 56yo f with syncope and myoclonus. EEG to  evaluate for seizure.  Level of alertness: Awake  AEDs during EEG study: None  Technical aspects: This EEG study was done with scalp electrodes  positioned according to the 10-20 International system of  electrode placement. Electrical activity was reviewed with band  pass filter of 1-'70Hz'$ , sensitivity of 7 uV/mm, display speed of  12m/sec with a '60Hz'$  notched filter applied as appropriate. EEG  data were recorded continuously and digitally stored.  Video  monitoring was available and reviewed as appropriate.  Description: The posterior dominant rhythm consists of 10 Hz  activity of moderate voltage (25-35 uV) seen predominantly in  posterior head regions, symmetric and reactive to eye opening and  eye closing. Hyperventilation did not show any EEG change.   Physiologic photic driving was seen during photic stimulation.  IMPRESSION: This study is within normal limits. No seizures or epileptiform  discharges were seen throughout the recording.  A normal interictal EEG does not exclude  the diagnosis of  epilepsy.  Priyanka  OBarbra Sarks MR BRAIN WO CONTRAST CLINICAL DATA:  Seizure, new onset.  EXAM: MRI HEAD WITHOUT CONTRAST  TECHNIQUE: Multiplanar, multiecho pulse sequences of the brain and surrounding structures were obtained without intravenous contrast.  COMPARISON:  Head CT 06/27/2022.  MRI 10/24/2021.  FINDINGS: Brain: Diffusion imaging does not show any acute or subacute infarction or other cause of restricted diffusion. The brainstem and cerebellum are normal. Cerebral hemispheres are normal. No small or large vessel infarction, mass lesion, hemorrhage, hydrocephalus or extra-axial collection. Mesial temporal lobes are symmetric and normal. Insignificant 2.5 mm cyst in the right hippocampus.  Vascular: Major vessels at the base of the brain show flow.  Skull and upper cervical spine: Negative  Sinuses/Orbits: Clear/normal  Other: None  IMPRESSION: Normal examination. No abnormality  seen to explain seizures.  Electronically Signed   By: Nelson Chimes M.D.   On: 06/30/2022 09:05    Assessment & Plan:    See Problem List for Assessment and Plan of chronic medical problems.

## 2022-07-03 ENCOUNTER — Ambulatory Visit: Payer: Federal, State, Local not specified - PPO | Admitting: Family Medicine

## 2022-07-03 ENCOUNTER — Encounter: Payer: Self-pay | Admitting: Internal Medicine

## 2022-07-03 ENCOUNTER — Ambulatory Visit (INDEPENDENT_AMBULATORY_CARE_PROVIDER_SITE_OTHER): Payer: Federal, State, Local not specified - PPO | Admitting: Internal Medicine

## 2022-07-03 VITALS — BP 126/74 | HR 78 | Temp 98.6°F | Ht 69.0 in | Wt 197.0 lb

## 2022-07-03 DIAGNOSIS — G253 Myoclonus: Secondary | ICD-10-CM | POA: Diagnosis not present

## 2022-07-03 DIAGNOSIS — I10 Essential (primary) hypertension: Secondary | ICD-10-CM

## 2022-07-03 DIAGNOSIS — K529 Noninfective gastroenteritis and colitis, unspecified: Secondary | ICD-10-CM | POA: Diagnosis not present

## 2022-07-03 DIAGNOSIS — F1010 Alcohol abuse, uncomplicated: Secondary | ICD-10-CM

## 2022-07-03 NOTE — Telephone Encounter (Signed)
Pt had appt with PCP at 830am

## 2022-07-03 NOTE — Progress Notes (Deleted)
I, Peterson Lombard, LAT, ATC acting as a scribe for Lynne Leader, MD.  Mary Sanford is a 56 y.o. female who presents to [place] at [facility] today for low back pain.  Patient was previously seen by Dr. Georgina Snell on 01/10/2021 for left thumb pain.  Today, patient presents with low back pain ongoing since mid November.  Patient locates pain to  Radiating pain: LE numbness/tingling: LE weakness: Aggravates: Treatments tried:   Pertinent review of systems: ***  Relevant historical information: ***   Exam:  There were no vitals taken for this visit. General: Well Developed, well nourished, and in no acute distress.   MSK: ***    Lab and Radiology Results Results for orders placed or performed during the hospital encounter of 06/27/22 (from the past 72 hour(s))  CBC     Status: Abnormal   Collection Time: 07/01/22  6:05 AM  Result Value Ref Range   WBC 5.0 4.0 - 10.5 K/uL   RBC 4.84 3.87 - 5.11 MIL/uL   Hemoglobin 11.2 (L) 12.0 - 15.0 g/dL   HCT 36.2 36.0 - 46.0 %   MCV 74.8 (L) 80.0 - 100.0 fL   MCH 23.1 (L) 26.0 - 34.0 pg   MCHC 30.9 30.0 - 36.0 g/dL   RDW 14.3 11.5 - 15.5 %   Platelets 236 150 - 400 K/uL   nRBC 0.0 0.0 - 0.2 %    Comment: Performed at [facility], [place] [address], [place], [place] [address]  Comprehensive metabolic panel     Status: Abnormal   Collection Time: 07/01/22  6:05 AM  Result Value Ref Range   Sodium 140 135 - 145 mmol/L   Potassium 4.3 3.5 - 5.1 mmol/L   Chloride 110 98 - 111 mmol/L   CO2 25 22 - 32 mmol/L   Glucose, Bld 109 (H) 70 - 99 mg/dL    Comment: Glucose reference range applies only to samples taken after fasting for at least 8 hours.   BUN 7 6 - 20 mg/dL   Creatinine, Ser 0.62 0.44 - 1.00 mg/dL   Calcium 8.9 8.9 - 10.3 mg/dL   Total Protein 5.1 (L) 6.5 - 8.1 g/dL   Albumin 2.5 (L) 3.5 - 5.0 g/dL   AST 31 15 - 41 U/L   ALT 28 0 - 44 U/L   Alkaline Phosphatase 51 38 - 126 U/L   Total Bilirubin 0.1 (L)  0.3 - 1.2 mg/dL   GFR, Estimated >60 >60 mL/min    Comment: (NOTE) Calculated using the CKD-EPI Creatinine Equation (2021)    Anion gap 5 5 - 15    Comment: Performed at [facility], [place] [address] [place], [place] [address]  Magnesium     Status: None   Collection Time: 07/01/22  6:05 AM  Result Value Ref Range   Magnesium 2.0 1.7 - 2.4 mg/dL    Comment: Performed at [facility] [address], [place], [place] [address]   EEG adult  Result Date: 06/30/2022 Lora Havens, MD     06/30/2022 10:59 AM Patient Name: Mary Sanford MRN: [id number] Epilepsy Attending: Lora Havens Referring Physician/Provider: Bonnielee Haff, MD Date: 06/30/2022 Duration: 23.46 mins Patient history: 56yo f with syncope and myoclonus. EEG to evaluate for seizure. Level of alertness: Awake AEDs during EEG study: None Technical aspects: This EEG study was done with scalp electrodes positioned according to the 10-20 International system of electrode placement. Dealer  activity was reviewed with band pass filter of 1-'70Hz'$ , sensitivity of 7 uV/mm, display speed of 16m/sec with a '60Hz'$  notched filter applied as appropriate. EEG data were recorded continuously and digitally stored.  Video monitoring was available and reviewed as appropriate. Description: The posterior dominant rhythm consists of 10 Hz activity of moderate voltage (25-35 uV) seen predominantly in posterior head regions, symmetric and reactive to eye opening and eye closing. Hyperventilation did not show any EEG change.  Physiologic photic driving was seen during photic stimulation. IMPRESSION: This study is within normal limits. No seizures or epileptiform discharges were seen throughout the recording. A normal interictal EEG does not exclude  the diagnosis of epilepsy. PLora Havens  MR BRAIN WO CONTRAST  Result Date: 06/30/2022 CLINICAL DATA:  Seizure, new onset. EXAM: MRI HEAD WITHOUT CONTRAST TECHNIQUE: Multiplanar,  multiecho pulse sequences of the brain and surrounding structures were obtained without intravenous contrast. COMPARISON:  Head CT 06/27/2022.  MRI 10/24/2021. FINDINGS: Brain: Diffusion imaging does not show any acute or subacute infarction or other cause of restricted diffusion. The brainstem and cerebellum are normal. Cerebral hemispheres are normal. No small or large vessel infarction, mass lesion, hemorrhage, hydrocephalus or extra-axial collection. Mesial temporal lobes are symmetric and normal. Insignificant 2.5 mm cyst in the right hippocampus. Vascular: Major vessels at the base of the brain show flow. Skull and upper cervical spine: Negative Sinuses/Orbits: Clear/normal Other: None IMPRESSION: Normal examination. No abnormality seen to explain seizures. Electronically Signed   By: MNelson ChimesM.D.   On: 06/30/2022 09:05       Assessment and Plan: 56y.o. female with ***   PDMP not reviewed this encounter. No orders of the defined types were placed in this encounter.  No orders of the defined types were placed in this encounter.    Discussed warning signs or symptoms. Please see discharge instructions. Patient expresses understanding.   ***

## 2022-07-03 NOTE — Patient Instructions (Addendum)
? ? ? ?  ? ? ?  Medications changes include :  none  ? ? ? ? ? ?Return if symptoms worsen or fail to improve. ? ?

## 2022-07-04 NOTE — Progress Notes (Deleted)
   I, Peterson Lombard, LAT, ATC acting as a scribe for Lynne Leader, MD.  Mary Sanford is a 56 y.o. female who presents to [place] at [facility] today for low back pain.  Patient was previously seen by Dr. Georgina Snell on 01/10/2021 for left thumb pain.  Today, patient presents with low back pain ongoing since mid November.  Patient locates pain to  Radiating pain: LE numbness/tingling: LE weakness: Aggravates: Treatments tried:   Pertinent review of systems: ***  Relevant historical information: ***   Exam:  There were no vitals taken for this visit. General: Well Developed, well nourished, and in no acute distress.   MSK: ***    Lab and Radiology Results No results found for this or any previous visit (from the past 72 hour(s)). EEG adult  Result Date: 06/30/2022 Lora Havens, MD     06/30/2022 10:59 AM Patient Name: Mary Sanford MRN: [id number] Epilepsy Attending: Lora Havens Referring Physician/Provider: Bonnielee Haff, MD Date: 06/30/2022 Duration: 23.46 mins Patient history: 56yo f with syncope and myoclonus. EEG to evaluate for seizure. Level of alertness: Awake AEDs during EEG study: None Technical aspects: This EEG study was done with scalp electrodes positioned according to the 10-20 International system of electrode placement. Electrical activity was reviewed with band pass filter of 1-'70Hz'$ , sensitivity of 7 uV/mm, display speed of 36m/sec with a '60Hz'$  notched filter applied as appropriate. EEG data were recorded continuously and digitally stored.  Video monitoring was available and reviewed as appropriate. Description: The posterior dominant rhythm consists of 10 Hz activity of moderate voltage (25-35 uV) seen predominantly in posterior head regions, symmetric and reactive to eye opening and eye closing. Hyperventilation did not show any EEG change.  Physiologic photic driving was seen during photic stimulation. IMPRESSION: This study is within  normal limits. No seizures or epileptiform discharges were seen throughout the recording. A normal interictal EEG does not exclude  the diagnosis of epilepsy. Priyanka OBarbra Sarks      Assessment and Plan: 56y.o. female with ***   PDMP not reviewed this encounter. No orders of the defined types were placed in this encounter.  No orders of the defined types were placed in this encounter.    Discussed warning signs or symptoms. Please see discharge instructions. Patient expresses understanding.   ***

## 2022-07-05 ENCOUNTER — Other Ambulatory Visit: Payer: Self-pay | Admitting: Internal Medicine

## 2022-07-05 DIAGNOSIS — E669 Obesity, unspecified: Secondary | ICD-10-CM

## 2022-07-07 ENCOUNTER — Ambulatory Visit: Payer: Federal, State, Local not specified - PPO | Admitting: Family Medicine

## 2022-08-07 ENCOUNTER — Inpatient Hospital Stay: Payer: Federal, State, Local not specified - PPO | Admitting: Family Medicine

## 2022-08-08 ENCOUNTER — Telehealth: Payer: Self-pay | Admitting: Internal Medicine

## 2022-08-08 NOTE — Telephone Encounter (Signed)
PT calls today in regard to some medication concerns. PT had visited the hospital back on 06/27/22 where they added clonazePAM (KLONOPIN) 0.25 MG disintegrating tablet  and removed triamterene-hydrochlorothiazide (MAXZIDE-25) .   PT was informed by ED to follow up with PCP which was not done, and that the clonazePAM would need to be weaned off over time. PT currently experiencing dizziness, headache, and back pain (of which PT stated headache and back pain was pretty common for her).  I let PT know I would get a message back to Dr.Jones and we would see what would be recommended.  CB: [phone]

## 2022-08-12 NOTE — Telephone Encounter (Signed)
Pt has called for a status update on the medication refill

## 2022-08-13 ENCOUNTER — Ambulatory Visit (INDEPENDENT_AMBULATORY_CARE_PROVIDER_SITE_OTHER): Payer: Federal, State, Local not specified - PPO | Admitting: Family Medicine

## 2022-08-13 VITALS — BP 176/104 | HR 92 | Ht 69.0 in | Wt 200.0 lb

## 2022-08-13 DIAGNOSIS — G8929 Other chronic pain: Secondary | ICD-10-CM

## 2022-08-13 DIAGNOSIS — M5442 Lumbago with sciatica, left side: Secondary | ICD-10-CM

## 2022-08-13 MED ORDER — GABAPENTIN 100 MG PO CAPS: 100.0000 mg | ORAL_CAPSULE | Freq: Every evening | ORAL | 3 refills | Status: DC | PRN

## 2022-08-13 MED ORDER — PREDNISONE 50 MG PO TABS: ORAL_TABLET | ORAL | 0 refills | Status: DC

## 2022-08-13 MED ORDER — CELECOXIB 100 MG PO CAPS: 100.0000 mg | ORAL_CAPSULE | Freq: Two times a day (BID) | ORAL | 1 refills | Status: DC | PRN

## 2022-08-13 NOTE — Patient Instructions (Addendum)
Thank you for coming in today.   I've referred you to Physical Therapy.  Let us know if you don't hear from them in one week.   Use the gabapentin at bedtime as needed for nerve pain.  Take up to 3 at a time.   Use celebrex for back pain.   Check back in 6 weeks

## 2022-08-13 NOTE — Progress Notes (Signed)
I, Peterson Lombard, LAT, ATC acting as a scribe for Mary Leader, MD.  Mary Sanford is a 57 y.o. female who presents to [place] at [facility] today for low back pain.  Patient was previously seen by Dr. Georgina Snell on 01/10/2021 for left thumb pain. Today, patient presents with low back pain that's chronic in nature, but worsened since mid-November.  Patient locates pain to the R-side of her low back w/ radiating pain into the L leg.  Radiating pain: yes LE numbness/tingling: no LE weakness: no Aggravates: straight leg raise Treatments tried: IBU, Tylenol, hot baths  Dx imaging: 06/27/22 L-spine CT  06/23/22 L-spine XR  Pertinent review of systems: no fever or chills  Relevant historical information: Chronic low back pain. Recent history of hospitalization for sepsis.  Will be moving to [place] soon. Works for the [employer]    Exam:  BP (!) 176/104   Pulse 92   Ht '5\' 9"'$  (1.753 m)   Wt 200 lb (90.7 kg)   SpO2 97%   BMI 29.53 kg/m  General: Well Developed, well nourished, and in no acute distress.   MSK: L-spine: Normal appearing Nontender to palpation spinal midline. Tender palpation right lumbar paraspinal musculature. Decreased lumbar motion. Lower extremity strength diminished to foot dorsiflexion on the left 4/5 otherwise intact. Reflexes are intact. Positive left-sided slump test.  Normal gait  Lab and Radiology Results  EXAM: CT LUMBAR SPINE WITHOUT CONTRAST   TECHNIQUE: Multidetector CT imaging of the lumbar spine was performed without intravenous contrast administration. Multiplanar CT image reconstructions were also generated.   RADIATION DOSE REDUCTION: This exam was performed according to the departmental dose-optimization program which includes automated exposure control, adjustment of the mA and/or kV according to patient size and/or use of iterative reconstruction technique.   COMPARISON:  None Available.    FINDINGS: Segmentation: 5 lumbar type vertebrae.   Alignment: Normal.   Vertebrae: No acute fracture or focal pathologic process.   Paraspinal and other soft tissues: Negative.   Disc levels: Mild disc bulge at L4-L5 at L5-S1 without significant spinal canal or foraminal stenosis. Mild facet joint arthropathy at L4-L5 and L5-S1.   IMPRESSION: 1. No acute fracture or subluxation. 2. Mild disc bulge at L4-L5 and L5-S1 without significant spinal canal or foraminal stenosis. 3. Mild facet joint arthropathy at L4-L5 and L5-S1.     Electronically Signed   By: Keane Police D.O.   On: 06/27/2022 20:46  I, Mary Sanford, personally (independently) visualized and performed the interpretation of the images attached in this note. '    Assessment and Plan: 57 y.o. female with chronic right low back pain with left lumbar radiculopathy and some mild left leg weakness.  This has been ongoing since at least November 20 when she was seen by her primary care provider.  This was exacerbated by a hospitalization for an unrelated issue that I think caused some core weakness or contributed to core weakness.  She has developed new left leg pain and weakness more recently.  I think she has a few issues.  #1 I think she has muscle dysfunction and spasm contributing to axial back pain mostly on the right.  Facet arthritis is also contributing to this problem.  Additionally she has left lumbar radiculopathy assistant with a little bit of weakness in an L5 dermatomal pattern.  Plan for trial of physical therapy.  Will use gabapentin mostly bedtime and have prednisone as a backup plan.  Limited Celebrex  for back pain.  Check back in 6 weeks.  If worsening or not improving proceed to MRI sooner.   PDMP not reviewed this encounter. Orders Placed This Encounter  Procedures   Ambulatory referral to Physical Therapy    Referral Priority:   Routine    Referral Type:   Physical Medicine    Referral Reason:    Specialty Services Required    Requested Specialty:   Physical Therapy    Number of Visits Requested:   1   Meds ordered this encounter  Medications   celecoxib (CELEBREX) 100 MG capsule    Sig: Take 1 capsule (100 mg total) by mouth 2 (two) times daily as needed.    Dispense:  60 capsule    Refill:  1   gabapentin (NEURONTIN) 100 MG capsule    Sig: Take 1-3 capsules (100-300 mg total) by mouth at bedtime as needed (nerve pain).    Dispense:  90 capsule    Refill:  3   predniSONE (DELTASONE) 50 MG tablet    Sig: Take 1 pill daily for 5 days    Dispense:  5 tablet    Refill:  0     Discussed warning signs or symptoms. Please see discharge instructions. Patient expresses understanding.   The above documentation has been reviewed and is accurate and complete Mary Sanford, M.D.

## 2022-08-14 ENCOUNTER — Other Ambulatory Visit: Payer: Self-pay | Admitting: Internal Medicine

## 2022-08-14 ENCOUNTER — Institutional Professional Consult (permissible substitution): Payer: Federal, State, Local not specified - PPO | Admitting: Neurology

## 2022-08-14 ENCOUNTER — Encounter: Payer: Self-pay | Admitting: Neurology

## 2022-08-14 DIAGNOSIS — E669 Obesity, unspecified: Secondary | ICD-10-CM

## 2022-08-19 ENCOUNTER — Other Ambulatory Visit: Payer: Self-pay

## 2022-08-19 ENCOUNTER — Encounter: Payer: Self-pay | Admitting: Physical Therapy

## 2022-08-19 ENCOUNTER — Ambulatory Visit (INDEPENDENT_AMBULATORY_CARE_PROVIDER_SITE_OTHER): Payer: Federal, State, Local not specified - PPO | Admitting: Physical Therapy

## 2022-08-19 DIAGNOSIS — M6281 Muscle weakness (generalized): Secondary | ICD-10-CM

## 2022-08-19 DIAGNOSIS — R293 Abnormal posture: Secondary | ICD-10-CM

## 2022-08-19 DIAGNOSIS — M5459 Other low back pain: Secondary | ICD-10-CM | POA: Diagnosis not present

## 2022-08-19 NOTE — Therapy (Addendum)
OUTPATIENT PHYSICAL THERAPY THORACOLUMBAR EVALUATION DISCHARGE SUMMARY   Patient Name: Mary Sanford MRN: [id number] DOB:January 12, 1966, 57 y.o., female Today's Date: 08/19/2022  END OF SESSION:  PT End of Session - 08/19/22 1306     Visit Number 1    Number of Visits 6    Date for PT Re-Evaluation 09/30/22    Authorization Type [employer] Federal $35 copay    PT Start Time 1305    PT Stop Time 1347    PT Time Calculation (min) 42 min    Activity Tolerance Patient tolerated treatment well    Behavior During Therapy WFL for tasks assessed/performed             Past Medical History:  Diagnosis Date   Allergy    seasonal allergies   Anemia    Anxiety    Arthritis    hands, left knee   COVID    Depression    Family history of adverse reaction to anesthesia    one sister ponv and slow to awaken   History of iron deficiency anemia    History of kidney stones    Hypertension    followed by pcp  (12-05-2019 pt had ETT 12-11-2011 , in epic, showed normal with no ischemia)   Nephrolithiasis    Panic disorder    PMB (postmenopausal bleeding)    Pre-diabetes    followed by pcp   Seasonal allergies    SUI (stress urinary incontinence, female)    Uterine fibroid    Wears glasses    Past Surgical History:  Procedure Laterality Date   BUNIONECTOMY Bilateral    CESAREAN SECTION  1989   CYSTOSCOPY W/ RETROGRADES Left 06/28/2018   Procedure: CYSTOSCOPY WITH RETROGRADE PYELOGRAM LEFT STENT;  Surgeon: Ceasar Mons, MD;  Location: [facility];  Service: Urology;  Laterality: Left;   CYSTOSCOPY/URETEROSCOPY/HOLMIUM LASER/STENT PLACEMENT Left 07/16/2018   Procedure: CYSTOSCOPY/URETEROSCOPY/HOLMIUM LASER/STENT EXCHANGE;  Surgeon: Ceasar Mons, MD;  Location: [facility];  Service: Urology;  Laterality: Left;   DILATATION & CURETTAGE/HYSTEROSCOPY WITH MYOSURE N/A 12/13/2019   Procedure: DILATATION & CURETTAGE/HYSTEROSCOPY WITH MYOSURE;  Surgeon:  Salvadore Dom, MD;  Location: [place];  Service: Gynecology;  Laterality: N/A;   DILITATION & CURRETTAGE/HYSTROSCOPY WITH NOVASURE ABLATION N/A 06/17/2013   Procedure: DILATATION & CURETTAGE/HYSTEROSCOPY WITH NOVASURE ABLATION;  Surgeon: Osborne Oman, MD;  Location: [facility];  Service: Gynecology;  Laterality: N/A;   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   Procedure: LAPAROSCOPIC ROUX-EN-Y GASTRIC BYPASS WITH UPPER ENDOSCOPY;  Surgeon: Greer Pickerel, MD;  Location: [facility];  Service: General;  Laterality: N/A;   HEMORRHOIDECTOMY WITH HEMORRHOID BANDING  10/19/2012   SC[facility] [place] Hem ligation/pexy   OPERATIVE ULTRASOUND N/A 12/13/2019   Procedure: OPERATIVE ULTRASOUND;  Surgeon: Salvadore Dom, MD;  Location: [facility];  Service: Gynecology;  Laterality: N/A;   Thumb surgery   04/2021   TUBAL LIGATION Bilateral 2001   UPPER GASTROINTESTINAL ENDOSCOPY  10/11/2020   WISDOM TOOTH EXTRACTION     Patient Active Problem List   Diagnosis Date Noted   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   Colitis 06/28/2022   Acute lumbar myofascial strain 06/23/2022   Muscle spasm 06/23/2022   Memory changes 05/05/2022   Primary osteoarthritis involving multiple joints 12/26/2021   Chronic bilateral low back pain without sciatica 12/26/2021   Pre-diabetes    Mild persistent asthma without complication [id number]   Overweight (BMI 25.0-29.9) 06/14/2020   Status post bariatric surgery 06/16/2019   Vitamin  D insufficiency 06/16/2019   Musculoskeletal back pain 06/22/2018   Depression with anxiety 12/28/2015   Allergic rhinitis 01/12/2013   B12 deficiency anemia 01/09/2012   Obesity (BMI 30-39.9) 01/09/2012   Essential hypertension, benign 11/24/2011    PCP: Scarlette Calico, MD  REFERRING PROVIDER: Gregor Hams, MD  REFERRING DIAG: [phone] (ICD-10-CM) - Chronic bilateral low back pain with left-sided sciatica  Rationale for Evaluation and Treatment: Rehabilitation  THERAPY  DIAG:  Other low back pain - Plan: PT plan of care cert/re-cert  Muscle weakness (generalized) - Plan: PT plan of care cert/re-cert  Abnormal posture - Plan: PT plan of care cert/re-cert  ONSET DATE: about 1 week ago  SUBJECTIVE:                                                                                                                                                                                           SUBJECTIVE STATEMENT: Pt presents to [facility] for chronic LBP with exacerbation and pain radiating into LLE.  Pt reports pain started about a week ago without known injury.  She recently started a prednisone taper which has helped but she isn't sure she wants to continue.   Also with recent hospitalization x 5 days for sepsis and syncope.  PERTINENT HISTORY:  chronic back pain, arthritis, HTN, anxiety, depression  PAIN:  Are you having pain? Yes: NPRS scale: 7 currently, up to 10; at best 5/10 Pain location: Rt side low back, radiates to Lt ankle Pain description: aching Aggravating factors: unsure, possibly weather Relieving factors: medication, heat, reports stretching makes it worse  PRECAUTIONS: None  WEIGHT BEARING RESTRICTIONS: No  FALLS:  Has patient fallen in last 6 months? No  LIVING ENVIRONMENT: Lives with: lives with their family (husband and 87 y/o daughter) Lives in: House/apartment Stairs: Yes: Internal: 14 steps; can reach both  OCCUPATION: full-time: seated computer work; Teacher, early years/pre for [employer]  PLOF: Independent and Leisure: watch TV, dancing, no regular exercise recently (was walking regularly)  PATIENT GOALS: improve strength and pain  NEXT MD VISIT: 09/24/21  OBJECTIVE:    PATIENT SURVEYS:  08/19/22: FOTO 51 (predicted 26)  SCREENING FOR RED FLAGS: Bowel or bladder incontinence: No Spinal tumors: No Cauda equina syndrome: No Compression fracture: No Abdominal aneurysm: No  COGNITION: Overall cognitive status: Within  functional limits for tasks assessed     SENSATION: WFL  MUSCLE LENGTH: 08/19/22: bil hamstring tightness noted; not formally measured  POSTURE: rounded shoulders and forward head  PALPATION: 08/19/22: very tender to light palpation Rt SIJ, trigger points noted in bil QL but did not appear to be active  LUMBAR  ROM:   AROM eval  Flexion WNL -  difficulty rising  Extension WNL  Right Quadrant WNL  Left Quadrant WN   (Blank rows = not tested)  LOWER EXTREMITY MMT:    MMT Right eval Left eval  Hip flexion 5/5 5/5  Hip extension 3/5 4/5  Hip abduction 3/5 4/5  Knee flexion 5/5 5/5  Knee extension 5/5 5/5   (Blank rows = not tested)  LUMBAR SPECIAL TESTS:  08/19/22: Slump test: Negative  GAIT: 08/19/22: Independent with all ambulation; no deviations noted  TODAY'S TREATMENT:                                                                                                                              DATE: 08/19/22 See HEP - demonstrated and performed trial reps PRN for comprehension with mod cues   PATIENT EDUCATION:  Education details: HEP Person educated: Patient Education method: Explanation, Demonstration, and Handouts Education comprehension: verbalized understanding, returned demonstration, and needs further education  HOME EXERCISE PROGRAM: Access Code: [facility] URL: [email or web address] Date: 08/19/2022 Prepared by: Faustino Congress  Exercises - Hooklying Isometric Clamshell  - 2 x daily - 7 x weekly - 1 sets - 15 reps - Supine Single Knee to Chest Stretch  - 2 x daily - 7 x weekly - 1 sets - 10 reps - 5-10 sec hold - Knee Nerve Slide/Glide  - 2 x daily - 7 x weekly - 1 sets - 10 reps - 5-10  sec hold  ASSESSMENT:  CLINICAL IMPRESSION: Patient is a 57 y.o. female who was seen today for physical therapy evaluation and treatment for low back pain.  She demonstrates decreased strength and mild postural abnormalities with continued pain  affecting functional mobility.  She will benefit from PT to address deficits listed.     OBJECTIVE IMPAIRMENTS: decreased activity tolerance, decreased mobility, decreased strength, increased fascial restrictions, increased muscle spasms, impaired flexibility, postural dysfunction, and pain.   ACTIVITY LIMITATIONS: carrying, lifting, bending, sitting, standing, squatting, stairs, transfers, bed mobility, bathing, toileting, dressing, hygiene/grooming, and locomotion level  PARTICIPATION LIMITATIONS: meal prep, cleaning, laundry, driving, shopping, community activity, and occupation  PERSONAL FACTORS: 3+ comorbidities: chronic back pain, arthritis, HTN, anxiety, depression  are also affecting patient's functional outcome.   REHAB POTENTIAL: Good  CLINICAL DECISION MAKING: Evolving/moderate complexity  EVALUATION COMPLEXITY: Moderate   GOALS: Goals reviewed with patient? Yes  SHORT TERM GOALS: Target date: 09/09/2022  Independent with initial HEP Goal status: INITIAL   LONG TERM GOALS: Target date: 09/30/2022  Independent with final HEP Goal status: INITIAL  2.  FOTO score improved to 59 Goal status: INITIAL  3.  Rt hip strength improved to 4/5 for improved mobility and function Goal status: INIITAL  4.  Report pain < 4/10 with ambulation and standing for improved function Goal status: INITIAL  5. Report centralization of symptoms for improved mobility Goal status: INITIAL   PLAN:  PT FREQUENCY: 1x/week  PT DURATION:  6 weeks  PLANNED INTERVENTIONS: Therapeutic exercises, Therapeutic activity, Neuromuscular re-education, Patient/Family education, Self Care, Joint mobilization, Aquatic Therapy, Dry Needling, Electrical stimulation, Spinal manipulation, Spinal mobilization, Cryotherapy, Moist heat, Taping, Traction, Manual therapy, and Re-evaluation.  PLAN FOR NEXT SESSION: review HEP, progress SI stabilization, hip strengthening, manual/modalities PRN    Laureen Abrahams, PT, DPT 08/19/22 2:08 PM     PHYSICAL THERAPY DISCHARGE SUMMARY  Visits from Start of Care: 1  Current functional level related to goals / functional outcomes: See above   Remaining deficits: See above   Education / Equipment: HEP   Patient agrees to discharge. Patient goals were not met. Patient is being discharged due to not returning since the last visit.  Laureen Abrahams, PT, DPT 10/03/22 9:52 AM  [facility] [address] [place], [place], [address] Phone: [phone]   Fax:  [phone]

## 2022-08-25 ENCOUNTER — Other Ambulatory Visit: Payer: Self-pay | Admitting: Family Medicine

## 2022-08-25 ENCOUNTER — Other Ambulatory Visit: Payer: Self-pay | Admitting: Internal Medicine

## 2022-08-25 ENCOUNTER — Institutional Professional Consult (permissible substitution): Payer: Federal, State, Local not specified - PPO | Admitting: Neurology

## 2022-08-25 DIAGNOSIS — E669 Obesity, unspecified: Secondary | ICD-10-CM

## 2022-08-25 DIAGNOSIS — G8929 Other chronic pain: Secondary | ICD-10-CM

## 2022-08-25 NOTE — Telephone Encounter (Signed)
This rx was meant to be used as a back up and is not refillable.

## 2022-09-14 ENCOUNTER — Other Ambulatory Visit: Payer: Self-pay | Admitting: Internal Medicine

## 2022-09-14 DIAGNOSIS — F418 Other specified anxiety disorders: Secondary | ICD-10-CM

## 2022-09-15 ENCOUNTER — Other Ambulatory Visit: Payer: Self-pay | Admitting: Internal Medicine

## 2022-09-15 DIAGNOSIS — E669 Obesity, unspecified: Secondary | ICD-10-CM

## 2022-09-15 DIAGNOSIS — I10 Essential (primary) hypertension: Secondary | ICD-10-CM

## 2022-09-19 DIAGNOSIS — I10 Essential (primary) hypertension: Secondary | ICD-10-CM | POA: Diagnosis not present

## 2022-09-19 DIAGNOSIS — R059 Cough, unspecified: Secondary | ICD-10-CM | POA: Diagnosis not present

## 2022-09-19 DIAGNOSIS — M1990 Unspecified osteoarthritis, unspecified site: Secondary | ICD-10-CM | POA: Insufficient documentation

## 2022-09-19 DIAGNOSIS — J069 Acute upper respiratory infection, unspecified: Secondary | ICD-10-CM | POA: Diagnosis not present

## 2022-09-23 NOTE — Progress Notes (Unsigned)
   I, Mary Sanford, LAT, ATC acting as a scribe for Mary Leader, MD.  Mary Sanford is a 57 y.o. female who presents to [place] at [facility] today for 6-week follow-up low back pain with radicular symptoms into her left leg.  Patient was last seen by Dr. Georgina Snell on 08/13/2022 and was prescribed Celebrex, gabapentin, and backup prednisone.  Patient was also referred to PT, completing 1 visit.  Today, patient reports ***  Dx imaging: 06/27/22 L-spine CT             06/23/22 L-spine XR  Pertinent review of systems: ***  Relevant historical information: ***   Exam:  There were no vitals taken for this visit. General: Well Developed, well nourished, and in no acute distress.   MSK: ***    Lab and Radiology Results No results found for this or any previous visit (from the past 72 hour(s)). No results found.     Assessment and Plan: 57 y.o. female with ***   PDMP not reviewed this encounter. No orders of the defined types were placed in this encounter.  No orders of the defined types were placed in this encounter.    Discussed warning signs or symptoms. Please see discharge instructions. Patient expresses understanding.   ***

## 2022-09-23 NOTE — Progress Notes (Signed)
57 y.o. [id number] Married Black or [place] American Not Hispanic or Latino female here for annual exam.  She is having hot flashes, tolerable.   H/O endometrial ablation. In 5/21 she underwent a hysteroscopy, D&C under u/s guidance for PMP bleeding. Pathology with a benign polyp.   She reports 3-4 episodes of vaginal spotting in the last 6 months. She has noticed it after intercourse, but also without it.   Intercourse is painful on entry. Using a lubricant, minimal help. She has a harder time having an orgasm for the last year. She has been on lexapro for years.     No LMP recorded. Patient has had an ablation.          Sexually active: Yes.    The current method of family planning is post menopausal status.    Exercising: No.  The patient does not participate in regular exercise at present. Smoker:  no  Health Maintenance: Pap:  07/06/2019 WNL Hr HPV neg 2017 ASCUS HPV NR neg, no other abnormal history History of Abnormal Pap: no MMG:  10/01/21 Density A Bi-rads 1 neg  BMD:   none  Colonoscopy: 11/05/20 polyps,  f/u 34yr  TDaP:  12/26/21 Gardasil: n/a   reports that she has been smoking cigarettes. She has never used smokeless tobacco. She reports that she does not currently use alcohol. She reports that she does not use drugs. 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 5 kids, 8 biological grand kids (12 all together).   Past Medical History:  Diagnosis Date   Allergy    seasonal allergies   Anemia    Anxiety    Arthritis    hands, left knee   COVID    Depression    Family history of adverse reaction to anesthesia    one sister ponv and slow to awaken   History of iron deficiency anemia    History of kidney stones    Hypertension    followed by pcp  (12-05-2019 pt had ETT 12-11-2011 , in epic, showed normal with no ischemia)   Nephrolithiasis    Panic disorder    PMB (postmenopausal bleeding)    Pre-diabetes    followed by pcp   Seasonal allergies    SUI (stress urinary  incontinence, female)    Uterine fibroid    Wears glasses     Past Surgical History:  Procedure Laterality Date   BUNIONECTOMY Bilateral    CESAREAN SECTION  1989   CYSTOSCOPY W/ RETROGRADES Left 06/28/2018   Procedure: CYSTOSCOPY WITH RETROGRADE PYELOGRAM LEFT STENT;  Surgeon: WCeasar Mons MD;  Location: [facility];  Service: Urology;  Laterality: Left;   CYSTOSCOPY/URETEROSCOPY/HOLMIUM LASER/STENT PLACEMENT Left 07/16/2018   Procedure: CYSTOSCOPY/URETEROSCOPY/HOLMIUM LASER/STENT EXCHANGE;  Surgeon: WCeasar Mons MD;  [facility]  Service: Urology;  Laterality: Left;   DILATATION & CURETTAGE/HYSTEROSCOPY WITH MYOSURE N/A 12/13/2019   Procedure: DILATATION & CURETTAGE/HYSTEROSCOPY WITH MYOSURE;  Surgeon: JSalvadore Dom MD;  [facility]  Service: Gynecology;  Laterality: N/A;   DILITATION & CURRETTAGE/HYSTROSCOPY WITH NOVASURE ABLATION N/A 06/17/2013   Procedure: DILATATION & CURETTAGE/HYSTEROSCOPY WITH NOVASURE ABLATION;  Surgeon: UOsborne Oman MD;  [facility];  Service: Gynecology;  Laterality: N/A;   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   Procedure: LAPAROSCOPIC ROUX-EN-Y GASTRIC BYPASS WITH UPPER ENDOSCOPY;  Surgeon: WGreer Pickerel MD;  Location: [facility];  Service: General;  Laterality: N/A;   HEMORRHOIDECTOMY WITH HEMORRHOID BANDING  10/19/2012   [facility] [place] ligation/pexy  OPERATIVE ULTRASOUND N/A 12/13/2019   Procedure: OPERATIVE ULTRASOUND;  Surgeon: Salvadore Dom, MD;  Location: [facility];  Service: Gynecology;  Laterality: N/A;   Thumb surgery   04/2021   TUBAL LIGATION Bilateral 2001   UPPER GASTROINTESTINAL ENDOSCOPY  10/11/2020   WISDOM TOOTH EXTRACTION      Current Outpatient Medications  Medication Sig Dispense Refill   acetaminophen (TYLENOL) 500 MG tablet Take 500-1,000 mg by mouth as needed for headache or moderate pain.     albuterol (VENTOLIN HFA) 108 (90  Base) MCG/ACT inhaler Inhale 1-2 puffs into the lungs every 6 (six) hours as needed for wheezing or shortness of breath (for seasonal allergies). (Patient taking differently: Inhale 1-2 puffs into the lungs as needed for wheezing or shortness of breath (for seasonal allergies).) 18 g 3   escitalopram (LEXAPRO) 20 MG tablet Take 1 tablet by mouth once daily 90 tablet 0   gabapentin (NEURONTIN) 100 MG capsule Take 1-3 capsules (100-300 mg total) by mouth at bedtime as needed (nerve pain). 90 capsule 3   nebivolol (BYSTOLIC) 5 MG tablet Take 1 tablet by mouth once daily 90 tablet 0   phentermine 37.5 MG capsule Take 1 capsule by mouth in the morning 30 capsule 0   triamterene-hydrochlorothiazide (DYAZIDE) 37.5-25 MG capsule Take 1 capsule by mouth daily.     celecoxib (CELEBREX) 100 MG capsule Take 1 capsule (100 mg total) by mouth 2 (two) times daily as needed. 60 capsule 1   No current facility-administered medications for this visit.    Family History  Problem Relation Age of Onset   Hypertension Mother    Hypothyroidism Mother    Rectal cancer Mother 55   Colon cancer Mother 72   Colon polyps Mother    Hypotension Sister    Hypothyroidism Sister    Diabetes Sister    Other Sister        pacemaker   Seizures Brother    Hypothyroidism Sister    Seizures Sister    Anemia Sister    Deep vein thrombosis Sister    Seizures Paternal Grandfather    Stroke Maternal Uncle        > 80   Diabetes Maternal Grandmother    Kidney failure Maternal Grandmother    Heart attack Maternal Grandfather    Other Paternal Grandmother        tumors throughout body   Hypertension Maternal Uncle    Esophageal cancer Neg Hx    Stomach cancer Neg Hx    Heart disease Neg Hx     Review of Systems  Genitourinary:  Positive for vaginal bleeding.  All other systems reviewed and are negative.   Exam:   BP 126/82   Pulse 66   Ht '5\' 9"'$  (1.753 m)   Wt 195 lb (88.5 kg)   SpO2 98%   BMI 28.80 kg/m    Weight change: '@WEIGHTCHANGE'$ @ Height:   Height: '5\' 9"'$  (175.3 cm)  Ht Readings from Last 3 Encounters:  09/30/22 '5\' 9"'$  (1.753 m)  09/24/22 '5\' 9"'$  (1.753 m)  08/13/22 '5\' 9"'$  (1.753 m)    General appearance: alert, cooperative and appears stated age Head: Normocephalic, without obvious abnormality, atraumatic Neck: no adenopathy, supple, symmetrical, trachea midline and thyroid normal to inspection and palpation Lungs: clear to auscultation bilaterally Cardiovascular: regular rate and rhythm Breasts: normal appearance, no masses or tenderness Abdomen: soft, non-tender; non distended,  no masses,  no organomegaly Extremities: extremities normal, atraumatic, no cyanosis or edema  Skin: Skin color, texture, turgor normal. No rashes or lesions Lymph nodes: Cervical, supraclavicular, and axillary nodes normal. No abnormal inguinal nodes palpated Neurologic: Grossly normal   Pelvic: External genitalia:  no lesions              Urethra:  normal appearing urethra with no masses, tenderness or lesions              Bartholins and Skenes: normal                 Vagina: mildly atrophic appearing vagina with normal color and discharge, no lesions              Cervix: no lesions               Bimanual Exam:  Uterus:  normal size, contour, position, consistency, mobility, non-tender              Adnexa: no mass, fullness, tenderness               Rectovaginal: Confirms               Anus:  normal sphincter tone, no lesions  Gae Dry, CMA chaperoned for the exam.  1. Well woman exam Discussed breast self exam Discussed calcium and vit D intake Labs with primary Mammogram due, she will schedule Colonoscopy UTD  2. Postmenopausal bleeding 3-4 episodes of spotting in the last 6 months - US PELVIS TRANSVAGINAL NON-OB (TV ONLY); Future  3. History of endometrial ablation  4. Screening for cervical cancer - Cytology - PAP  5. Dyspareunia, female Given samples of uberlube After evaluation  for PMP bleeding, consider vaginal estrogen.

## 2022-09-24 ENCOUNTER — Ambulatory Visit: Payer: Federal, State, Local not specified - PPO | Admitting: Family Medicine

## 2022-09-24 ENCOUNTER — Encounter: Payer: Self-pay | Admitting: Family Medicine

## 2022-09-24 VITALS — BP 138/98 | HR 86 | Ht 69.0 in | Wt 197.4 lb

## 2022-09-24 DIAGNOSIS — M47816 Spondylosis without myelopathy or radiculopathy, lumbar region: Secondary | ICD-10-CM | POA: Diagnosis not present

## 2022-09-24 DIAGNOSIS — G8929 Other chronic pain: Secondary | ICD-10-CM | POA: Diagnosis not present

## 2022-09-24 DIAGNOSIS — M5442 Lumbago with sciatica, left side: Secondary | ICD-10-CM | POA: Diagnosis not present

## 2022-09-24 NOTE — Patient Instructions (Addendum)
Thank you for coming in today.   I've referred you to Physical Therapy.  Let us know if you don't hear from them in one week.   You should hear from MRI scheduling within 1 week. If you do not hear please let me know.    Let's see what the MRI says and go from there

## 2022-09-30 ENCOUNTER — Encounter: Payer: Self-pay | Admitting: Obstetrics and Gynecology

## 2022-09-30 ENCOUNTER — Other Ambulatory Visit (HOSPITAL_COMMUNITY)
Admission: RE | Admit: 2022-09-30 | Discharge: 2022-09-30 | Disposition: A | Discharge status: Home / Self Care | Payer: Federal, State, Local not specified - PPO | Source: Ambulatory Visit | Attending: Obstetrics and Gynecology | Admitting: Obstetrics and Gynecology

## 2022-09-30 ENCOUNTER — Ambulatory Visit (INDEPENDENT_AMBULATORY_CARE_PROVIDER_SITE_OTHER): Payer: Federal, State, Local not specified - PPO | Admitting: Obstetrics and Gynecology

## 2022-09-30 VITALS — BP 126/82 | HR 66 | Ht 69.0 in | Wt 195.0 lb

## 2022-09-30 DIAGNOSIS — Z124 Encounter for screening for malignant neoplasm of cervix: Secondary | ICD-10-CM | POA: Diagnosis not present

## 2022-09-30 DIAGNOSIS — Z9889 Other specified postprocedural states: Secondary | ICD-10-CM

## 2022-09-30 DIAGNOSIS — N950 Postmenopausal bleeding: Secondary | ICD-10-CM | POA: Diagnosis not present

## 2022-09-30 DIAGNOSIS — N941 Unspecified dyspareunia: Secondary | ICD-10-CM | POA: Diagnosis not present

## 2022-09-30 DIAGNOSIS — Z01419 Encounter for gynecological examination (general) (routine) without abnormal findings: Secondary | ICD-10-CM

## 2022-09-30 NOTE — Patient Instructions (Signed)

## 2022-10-02 LAB — CYTOLOGY - PAP
Comment: NEGATIVE
Diagnosis: NEGATIVE
High risk HPV: NEGATIVE

## 2022-10-10 ENCOUNTER — Other Ambulatory Visit: Payer: Self-pay | Admitting: Family Medicine

## 2022-10-10 NOTE — Telephone Encounter (Signed)
Rx refill request approved per Dr. Corey's orders. 

## 2022-10-13 ENCOUNTER — Ambulatory Visit
Admission: RE | Admit: 2022-10-13 | Discharge: 2022-10-13 | Disposition: A | Discharge status: Home / Self Care | Payer: Federal, State, Local not specified - PPO | Source: Ambulatory Visit | Attending: Family Medicine | Admitting: Family Medicine

## 2022-10-13 DIAGNOSIS — M5451 Vertebrogenic low back pain: Secondary | ICD-10-CM | POA: Diagnosis not present

## 2022-10-13 DIAGNOSIS — M545 Low back pain, unspecified: Secondary | ICD-10-CM | POA: Diagnosis not present

## 2022-10-13 DIAGNOSIS — G8929 Other chronic pain: Secondary | ICD-10-CM

## 2022-10-13 DIAGNOSIS — R293 Abnormal posture: Secondary | ICD-10-CM | POA: Diagnosis not present

## 2022-10-14 ENCOUNTER — Other Ambulatory Visit: Payer: Self-pay | Admitting: Internal Medicine

## 2022-10-14 DIAGNOSIS — E669 Obesity, unspecified: Secondary | ICD-10-CM

## 2022-10-15 NOTE — Progress Notes (Signed)
Lumbar spine MRI does not show a lot of pinched nerves but I do see that the facet joints at L4-L5 look a little degenerative to me.  Recommend injection of those joints.  Recommend return to clinic to go over the results in full detail talk about injections in the near future.

## 2022-10-16 ENCOUNTER — Other Ambulatory Visit: Payer: Self-pay | Admitting: Internal Medicine

## 2022-10-16 DIAGNOSIS — Z1231 Encounter for screening mammogram for malignant neoplasm of breast: Secondary | ICD-10-CM

## 2022-10-20 ENCOUNTER — Ambulatory Visit (INDEPENDENT_AMBULATORY_CARE_PROVIDER_SITE_OTHER): Payer: Federal, State, Local not specified - PPO | Admitting: Family Medicine

## 2022-10-20 ENCOUNTER — Encounter: Payer: Self-pay | Admitting: Family Medicine

## 2022-10-20 VITALS — BP 156/98 | HR 80 | Ht 69.0 in | Wt 198.0 lb

## 2022-10-20 DIAGNOSIS — G8929 Other chronic pain: Secondary | ICD-10-CM

## 2022-10-20 DIAGNOSIS — M545 Low back pain, unspecified: Secondary | ICD-10-CM | POA: Diagnosis not present

## 2022-10-20 MED ORDER — TIZANIDINE HCL 2 MG PO TABS: 2.0000 mg | ORAL_TABLET | Freq: Three times a day (TID) | ORAL | 1 refills | Status: DC | PRN

## 2022-10-20 NOTE — Patient Instructions (Signed)
Thank you for coming in today.   Continue to PT.   Use the tizanidine at bedtime mostly for muscle spasm.  OK to take with celebrex.   Recheck in 1 month.   Use a heating pad.   OK to try a TENS unit.     TENS UNIT: This is helpful for muscle pain and spasm.   Search and Purchase a TENS 7000 2nd edition at  www.tenspros.com or www.[employer].com It should be less than $30.     TENS unit instructions: Do not shower or bathe with the unit on Turn the unit off before removing electrodes or batteries If the electrodes lose stickiness add a drop of water to the electrodes after they are disconnected from the unit and place on plastic sheet. If you continued to have difficulty, call the TENS unit company to purchase more electrodes. Do not apply lotion on the skin area prior to use. Make sure the skin is clean and dry as this will help prolong the life of the electrodes. After use, always check skin for unusual red areas, rash or other skin difficulties. If there are any skin problems, does not apply electrodes to the same area. Never remove the electrodes from the unit by pulling the wires. Do not use the TENS unit or electrodes other than as directed. Do not change electrode placement without consultating your therapist or physician. Keep 2 fingers with between each electrode. Wear time ratio is 2:1, on to off times.    For example on for 30 minutes off for 15 minutes and then on for 30 minutes off for 15 minutes

## 2022-10-20 NOTE — Progress Notes (Unsigned)
    Mary Sanford is a 57 y.o. female who presents to [place] at [facility] today for low back pain. Would like to discuss ESI. The back is flaring up over the past 2 days. [place] started to flare-up after reaching for a bag of soil. Sx are mostly right-sided. Some pain in the mid-back. Has tried taking Advil dual action with no relief. Has been taking Gabapentin and Celebrex. Denies radiating pain into the leg. No relief with heat or massage.    Pertinent review of systems: ***  Relevant historical information: ***   Exam:  There were no vitals taken for this visit. General: Well Developed, well nourished, and in no acute distress.   MSK: ***    Lab and Radiology Results No results found for this or any previous visit (from the past 72 hour(s)). No results found.     Assessment and Plan: 57 y.o. female with ***   PDMP not reviewed this encounter. No orders of the defined types were placed in this encounter.  No orders of the defined types were placed in this encounter.    Discussed warning signs or symptoms. Please see discharge instructions. Patient expresses understanding.   ***

## 2022-10-21 DIAGNOSIS — R293 Abnormal posture: Secondary | ICD-10-CM | POA: Diagnosis not present

## 2022-10-21 DIAGNOSIS — M5451 Vertebrogenic low back pain: Secondary | ICD-10-CM | POA: Diagnosis not present

## 2022-10-28 ENCOUNTER — Ambulatory Visit: Payer: Federal, State, Local not specified - PPO | Admitting: Gastroenterology

## 2022-10-29 ENCOUNTER — Ambulatory Visit
Admission: RE | Admit: 2022-10-29 | Discharge: 2022-10-29 | Disposition: A | Discharge status: Home / Self Care | Payer: Federal, State, Local not specified - PPO | Source: Ambulatory Visit

## 2022-10-29 ENCOUNTER — Other Ambulatory Visit: Payer: Self-pay

## 2022-10-29 DIAGNOSIS — Z1231 Encounter for screening mammogram for malignant neoplasm of breast: Secondary | ICD-10-CM

## 2022-10-29 DIAGNOSIS — N950 Postmenopausal bleeding: Secondary | ICD-10-CM

## 2022-10-30 ENCOUNTER — Ambulatory Visit: Payer: Federal, State, Local not specified - PPO | Admitting: Obstetrics and Gynecology

## 2022-10-30 ENCOUNTER — Ambulatory Visit (INDEPENDENT_AMBULATORY_CARE_PROVIDER_SITE_OTHER): Payer: Federal, State, Local not specified - PPO

## 2022-10-30 ENCOUNTER — Encounter: Payer: Self-pay | Admitting: Obstetrics and Gynecology

## 2022-10-30 VITALS — BP 128/74 | HR 76 | Wt 195.0 lb

## 2022-10-30 DIAGNOSIS — Z9889 Other specified postprocedural states: Secondary | ICD-10-CM

## 2022-10-30 DIAGNOSIS — N83201 Unspecified ovarian cyst, right side: Secondary | ICD-10-CM | POA: Diagnosis not present

## 2022-10-30 DIAGNOSIS — N950 Postmenopausal bleeding: Secondary | ICD-10-CM | POA: Diagnosis not present

## 2022-10-30 NOTE — Progress Notes (Signed)
GYNECOLOGY  VISIT   HPI: 57 y.o.   Married Black or [place] American Not Hispanic or Latino  female   [phone] with No LMP recorded. Patient has had an ablation.   here for ultrasound consult for PMP bleeding. H/O endometrial ablation.  She reports 3-4 episodes of vaginal spotting in the last 7 months (last time was ~3 months ago). She has noticed spotting after intercourse, but also without it. Thinks 2 or 3 times the spotting was after intercourse. A few times the spotting was not associated with intercourse and occurred after she had some back pain.   In 4/21 she was also evaluated for PMP bleeding. Her endometrial stripe measured 3.1 mm, irregular. Unable to get into her uterine cavity with attempt at sonohysterogram or endometrial biopsy (prior to surgery).   In 5/21 she underwent a hysteroscopy, D&C under u/s guidance. Pathology with a benign polyp.  12/20 FSH 69.3  GYNECOLOGIC HISTORY: No LMP recorded. Patient has had an ablation. 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   Colitis 06/28/2022   Muscle spasm 06/23/2022   Memory changes 05/05/2022   Primary osteoarthritis involving multiple joints 12/26/2021   Chronic bilateral low back pain without sciatica 12/26/2021   Pre-diabetes    Mild persistent asthma without complication [id number]   Overweight (BMI 25.0-29.9) 06/14/2020   Status post bariatric surgery 06/16/2019   Vitamin D insufficiency 06/16/2019   Musculoskeletal back pain 06/22/2018   Depression with anxiety 12/28/2015   Allergic rhinitis 01/12/2013   B12 deficiency anemia 01/09/2012   Essential hypertension, benign 11/24/2011     Past Medical History:  Diagnosis Date   Allergy    seasonal allergies   Anemia    Anxiety    Arthritis    hands, left knee   COVID    Depression    Family history of adverse reaction to anesthesia    one sister ponv and slow to awaken   History of iron deficiency anemia    History of kidney stones    Hypertension    followed by pcp  (12-05-2019 pt had ETT 12-11-2011 , in epic, showed normal with no ischemia)   Nephrolithiasis    Panic disorder    PMB (postmenopausal bleeding)    Pre-diabetes    followed by pcp   Seasonal allergies    SUI (stress urinary incontinence, female)    Uterine fibroid    Wears glasses     Past Surgical History:  Procedure Laterality Date   BUNIONECTOMY Bilateral    CESAREAN SECTION  1989   CYSTOSCOPY W/ RETROGRADES Left 06/28/2018   Procedure: CYSTOSCOPY WITH RETROGRADE PYELOGRAM LEFT STENT;  Surgeon: Ceasar Mons, MD;  Location: [facility];  Service: Urology;  Laterality: Left;   CYSTOSCOPY/URETEROSCOPY/HOLMIUM LASER/STENT PLACEMENT Left 07/16/2018   Procedure: CYSTOSCOPY/URETEROSCOPY/HOLMIUM LASER/STENT EXCHANGE;  Surgeon: Ceasar Mons, MD;  Location: [facility];  Service: Urology;  Laterality: Left;   DILATATION & CURETTAGE/HYSTEROSCOPY WITH MYOSURE N/A 12/13/2019   Procedure: DILATATION & CURETTAGE/HYSTEROSCOPY WITH MYOSURE;  Surgeon: Salvadore Dom, MD;  Location: [place];  Service: Gynecology;  Laterality: N/A;   DILITATION & CURRETTAGE/HYSTROSCOPY WITH NOVASURE ABLATION N/A 06/17/2013   Procedure: DILATATION & CURETTAGE/HYSTEROSCOPY WITH NOVASURE ABLATION;  Surgeon: Osborne Oman, MD;  Location: [facility];  Service: Gynecology;  Laterality: N/A;   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   Procedure: LAPAROSCOPIC ROUX-EN-Y GASTRIC BYPASS WITH UPPER ENDOSCOPY;  Surgeon: Greer Pickerel, MD;  Location: [facility];  Service: General;  Laterality: N/A;   HEMORRHOIDECTOMY WITH HEMORRHOID BANDING   10/19/2012   [facility] Hem ligation/pexy   OPERATIVE ULTRASOUND N/A 12/13/2019   Procedure: OPERATIVE ULTRASOUND;  Surgeon: Salvadore Dom, MD;  Location: [facility];  Service: Gynecology;  Laterality: N/A;   Thumb surgery   04/2021   TUBAL LIGATION Bilateral 2001   UPPER GASTROINTESTINAL ENDOSCOPY  10/11/2020   WISDOM TOOTH EXTRACTION      Current Outpatient Medications  Medication Sig Dispense Refill   acetaminophen (TYLENOL) 500 MG tablet Take 500-1,000 mg by mouth as needed for headache or moderate pain.     albuterol (VENTOLIN HFA) 108 (90 Base) MCG/ACT inhaler Inhale 1-2 puffs into the lungs every 6 (six) hours as needed for wheezing or shortness of breath (for seasonal allergies). (Patient taking differently: Inhale 1-2 puffs into the lungs as needed for wheezing or shortness of breath (for seasonal allergies).) 18 g 3   celecoxib (CELEBREX) 100 MG capsule TAKE 1 CAPSULE BY MOUTH TWICE DAILY AS NEEDED 60 capsule 0   escitalopram (LEXAPRO) 20 MG tablet Take 1 tablet by mouth once daily 90 tablet 0   nebivolol (BYSTOLIC) 5 MG tablet Take 1 tablet by mouth once daily 90 tablet 0   phentermine 37.5 MG capsule Take 1 capsule by mouth in the morning 30 capsule 0   tiZANidine (ZANAFLEX) 2 MG tablet Take 1-2 tablets (2-4 mg total) by mouth every 8 (eight) hours as needed for muscle spasms. 60 tablet 1   triamterene-hydrochlorothiazide (DYAZIDE) 37.5-25 MG capsule Take 1 capsule by mouth daily.     No current facility-administered medications for this visit.     ALLERGIES: Naproxen  Family History  Problem Relation Age of Onset   Hypertension Mother    Hypothyroidism Mother    Rectal cancer Mother 17   Colon cancer Mother 48   Colon polyps Mother    Hypotension Sister    Hypothyroidism Sister    Diabetes Sister    Other Sister        pacemaker   Seizures Brother    Hypothyroidism Sister    Seizures Sister    Anemia Sister    Deep vein thrombosis Sister     Seizures Paternal Grandfather    Stroke Maternal Uncle        > 65   Diabetes Maternal Grandmother    Kidney failure Maternal Grandmother    Heart attack Maternal Grandfather    Other Paternal Grandmother        tumors throughout body   Hypertension Maternal Uncle    Esophageal cancer Neg Hx    Stomach cancer Neg Hx    Heart disease Neg Hx     Social History   Socioeconomic History   Marital status: Married    Spouse name: Not on file   Number of children: Not on file   Years of education: Not on file  Highest education level: Not on file  Occupational History   Occupation: minister  Tobacco Use   Smoking status: Light Smoker    Years: 5    Types: Cigarettes    Last attempt to quit: 08/05/1999    Years since quitting: 23.2   Smokeless tobacco: Never   Tobacco comments:    Occasionally  Vaping Use   Vaping Use: Never used  Substance and Sexual Activity   Alcohol use: Not Currently    Comment: socially   Drug use: No   Sexual activity: Yes    Partners: Male    Birth control/protection: Surgical    Comment: BTL, Ablation  Other Topics Concern   Not on file  Social History Narrative   Not on file   Social Determinants of Health   Financial Resource Strain: Not on file  Food Insecurity: Not on file  Transportation Needs: Not on file  Physical Activity: Not on file  Stress: Not on file  Social Connections: Not on file  Intimate Partner Violence: Not on file    ROS  PHYSICAL EXAMINATION:    BP 128/74   Pulse 76   Wt 195 lb (88.5 kg)   SpO2 98%   BMI 28.80 kg/m     General appearance: alert, cooperative and appears stated age  Pelvic ultrasound  Indications: PMP bleeding  Findings:  Uterus 6.81 x 5.08 x 3.80 cm  2 small intramural fibroids -1.21 x 1.17 cm -0.96 x 0.59 cm  Endometrium 3.47 mm, irregular with shadowing (s/p ablation). No obvious masses, avascular  Left ovary 2.32 x 1.52 x 1.13 cm  Right ovary 2.54 x 2.06 x 1.74 cm  1.37 x  1.18 cm simple cyst  No free fluid  Impression:  Normal sized, anteverted uterus 2 small intramural fibroids (stable) Thin irregular endometrium without obvious masses Normal left ovary 1.4 cm simple cyst in the right ovary (1.7 x 1.3 cm cyst in the right ovary in 4/21)  Chaperone was present for exam.  1. Post-menopausal bleeding No concerning findings on u/s but exam is limited with h/o endometrial ablation.  We discussed the limitation of evaluating her with h/o endometrial ablation. Discussed the option of hysteroscopy, D&C under u/s guidance. She would like to proceed with further evaluation. Will refer to GYN surgeon  2. History of endometrial ablation  3. Cyst of right ovary Benign appearing 1.4 x 1.2 cm simple cyst. On u/s in 4/21 she had a simple cyst that measured 1.7 x 1.3 cm.

## 2022-11-03 ENCOUNTER — Ambulatory Visit: Payer: Federal, State, Local not specified - PPO | Admitting: Internal Medicine

## 2022-11-03 VITALS — BP 118/82 | HR 90 | Temp 98.4°F | Resp 16 | Ht 69.0 in | Wt 198.0 lb

## 2022-11-03 DIAGNOSIS — Z0000 Encounter for general adult medical examination without abnormal findings: Secondary | ICD-10-CM | POA: Diagnosis not present

## 2022-11-03 DIAGNOSIS — R7303 Prediabetes: Secondary | ICD-10-CM

## 2022-11-03 DIAGNOSIS — Z0001 Encounter for general adult medical examination with abnormal findings: Secondary | ICD-10-CM

## 2022-11-03 DIAGNOSIS — I10 Essential (primary) hypertension: Secondary | ICD-10-CM

## 2022-11-03 DIAGNOSIS — D510 Vitamin B12 deficiency anemia due to intrinsic factor deficiency: Secondary | ICD-10-CM | POA: Diagnosis not present

## 2022-11-03 LAB — CBC WITH DIFFERENTIAL/PLATELET
Basophils Absolute: 0.1 10*3/uL (ref 0.0–0.1)
Basophils Relative: 1.3 % (ref 0.0–3.0)
Eosinophils Absolute: 0.1 10*3/uL (ref 0.0–0.7)
Eosinophils Relative: 2 % (ref 0.0–5.0)
HCT: 41.8 % (ref 36.0–46.0)
Hemoglobin: 13.3 g/dL (ref 12.0–15.0)
Lymphocytes Relative: 43 % (ref 12.0–46.0)
Lymphs Abs: 2.2 10*3/uL (ref 0.7–4.0)
MCHC: 31.8 g/dL (ref 30.0–36.0)
MCV: 72.8 fl — ABNORMAL LOW (ref 78.0–100.0)
Monocytes Absolute: 0.6 10*3/uL (ref 0.1–1.0)
Monocytes Relative: 11 % (ref 3.0–12.0)
Neutro Abs: 2.2 10*3/uL (ref 1.4–7.7)
Neutrophils Relative %: 42.7 % — ABNORMAL LOW (ref 43.0–77.0)
Platelets: 248 10*3/uL (ref 150.0–400.0)
RBC: 5.74 Mil/uL — ABNORMAL HIGH (ref 3.87–5.11)
RDW: 14.5 % (ref 11.5–15.5)
WBC: 5.1 10*3/uL (ref 4.0–10.5)

## 2022-11-03 LAB — BASIC METABOLIC PANEL
BUN: 18 mg/dL (ref 6–23)
CO2: 31 mEq/L (ref 19–32)
Calcium: 10.1 mg/dL (ref 8.4–10.5)
Chloride: 103 mEq/L (ref 96–112)
Creatinine, Ser: 0.77 mg/dL (ref 0.40–1.20)
GFR: 86.8 mL/min (ref >60.00)
Glucose, Bld: 96 mg/dL (ref 70–99)
Potassium: 4.3 mEq/L (ref 3.5–5.1)
Sodium: 139 mEq/L (ref 135–145)

## 2022-11-03 LAB — FOLATE: Folate: 7.2 ng/mL (ref >5.9)

## 2022-11-03 LAB — HEMOGLOBIN A1C: Hgb A1c MFr Bld: 6.3 % (ref 4.6–6.5)

## 2022-11-03 NOTE — Patient Instructions (Signed)

## 2022-11-03 NOTE — Progress Notes (Signed)
Subjective:  Patient ID: Mary Sanford, female    DOB: Apr 04, 1966  Age: 57 y.o. MRN: [id number]  CC: Anemia, Hypertension, and Annual Exam   HPI Mary Sanford presents for a CPX and f/up ----  She complains of chronic, unchanged low back pain, emotional stress, insomnia, and fatigue.  She is active and denies chest pain, shortness of breath, diaphoresis, or edema.  Outpatient Medications Prior to Visit  Medication Sig Dispense Refill   acetaminophen (TYLENOL) 500 MG tablet Take 500-1,000 mg by mouth as needed for headache or moderate pain.     albuterol (VENTOLIN HFA) 108 (90 Base) MCG/ACT inhaler Inhale 1-2 puffs into the lungs every 6 (six) hours as needed for wheezing or shortness of breath (for seasonal allergies). (Patient taking differently: Inhale 1-2 puffs into the lungs as needed for wheezing or shortness of breath (for seasonal allergies).) 18 g 3   celecoxib (CELEBREX) 100 MG capsule TAKE 1 CAPSULE BY MOUTH TWICE DAILY AS NEEDED 60 capsule 0   escitalopram (LEXAPRO) 20 MG tablet Take 1 tablet by mouth once daily 90 tablet 0   nebivolol (BYSTOLIC) 5 MG tablet Take 1 tablet by mouth once daily 90 tablet 0   phentermine 37.5 MG capsule Take 1 capsule by mouth in the morning 30 capsule 0   tiZANidine (ZANAFLEX) 2 MG tablet Take 1-2 tablets (2-4 mg total) by mouth every 8 (eight) hours as needed for muscle spasms. 60 tablet 1   triamterene-hydrochlorothiazide (DYAZIDE) 37.5-25 MG capsule Take 1 capsule by mouth daily.     No facility-administered medications prior to visit.    ROS Review of Systems  Constitutional:  Positive for fatigue. Negative for diaphoresis and unexpected weight change.  HENT: Negative.    Eyes: Negative.   Respiratory:  Negative for cough, chest tightness, shortness of breath and wheezing.   Cardiovascular:  Negative for chest pain, palpitations and leg swelling.  Gastrointestinal:  Negative for abdominal pain, constipation, diarrhea and vomiting.   Endocrine: Negative.   Genitourinary: Negative.  Negative for difficulty urinating.  Musculoskeletal:  Positive for back pain. Negative for myalgias.  Skin: Negative.   Neurological:  Negative for dizziness, weakness and light-headedness.  Hematological:  Negative for adenopathy. Does not bruise/bleed easily.  Psychiatric/Behavioral:  Positive for dysphoric mood and sleep disturbance. Negative for confusion, decreased concentration and suicidal ideas. The patient is nervous/anxious. The patient is not hyperactive.     Objective:  BP 118/82 (BP Location: Left Arm, Patient Position: Sitting, Cuff Size: Large)   Pulse 90   Temp 98.4 F (36.9 C) (Oral)   Resp 16   Ht 5\' 9"  (1.753 m)   Wt 198 lb (89.8 kg)   SpO2 96%   BMI 29.24 kg/m   BP Readings from Last 3 Encounters:  11/03/22 118/82  10/30/22 128/74  10/20/22 (!) 156/98    Wt Readings from Last 3 Encounters:  11/03/22 198 lb (89.8 kg)  10/30/22 195 lb (88.5 kg)  10/20/22 198 lb (89.8 kg)    Physical Exam Vitals reviewed.  HENT:     Nose: Nose normal.     Mouth/Throat:     Mouth: Mucous membranes are moist.  Eyes:     General: No scleral icterus.    Conjunctiva/sclera: Conjunctivae normal.  Cardiovascular:     Rate and Rhythm: Normal rate and regular rhythm.     Heart sounds: No murmur heard. Pulmonary:     Effort: Pulmonary effort is normal.     Breath sounds: No  stridor. No wheezing, rhonchi or rales.  Abdominal:     General: Abdomen is flat.     Palpations: There is no mass.     Tenderness: There is no abdominal tenderness. There is no guarding.     Hernia: No hernia is present.  Musculoskeletal:        General: Normal range of motion.     Cervical back: Neck supple.     Right lower leg: No edema.     Left lower leg: No edema.  Lymphadenopathy:     Cervical: No cervical adenopathy.  Skin:    General: Skin is warm and dry.  Neurological:     General: No focal deficit present.     Mental Status: She is  alert.  Psychiatric:        Mood and Affect: Mood normal.        Behavior: Behavior normal.     Lab Results  Component Value Date   WBC 5.1 11/03/2022   HGB 13.3 11/03/2022   HCT 41.8 11/03/2022   PLT 248.0 11/03/2022   GLUCOSE 96 11/03/2022   CHOL 142 12/26/2021   TRIG 122.0 12/26/2021   HDL 61.60 12/26/2021   LDLCALC 56 12/26/2021   ALT 28 07/01/2022   AST 31 07/01/2022   NA 139 11/03/2022   K 4.3 11/03/2022   CL 103 11/03/2022   CREATININE 0.77 11/03/2022   BUN 18 11/03/2022   CO2 31 11/03/2022   TSH 0.682 06/28/2022   INR 1.1 06/27/2022   HGBA1C 6.3 11/03/2022    MM 3D SCREENING MAMMOGRAM BILATERAL BREAST  Result Date: 10/30/2022 CLINICAL DATA:  Screening. EXAM: DIGITAL SCREENING BILATERAL MAMMOGRAM WITH TOMOSYNTHESIS AND CAD TECHNIQUE: Bilateral screening digital craniocaudal and mediolateral oblique mammograms were obtained. Bilateral screening digital breast tomosynthesis was performed. The images were evaluated with computer-aided detection. COMPARISON:  Previous exam(s). ACR Breast Density Category a: The breasts are almost entirely fatty. FINDINGS: There are no findings suspicious for malignancy. IMPRESSION: No mammographic evidence of malignancy. A result letter of this screening mammogram will be mailed directly to the patient. RECOMMENDATION: Screening mammogram in one year. (Code:SM-B-01Y) BI-RADS CATEGORY  1: Negative. Electronically Signed   By: Bary Richard M.D.   On: 10/30/2022 08:52    Assessment & Plan:   Vitamin B12 deficiency anemia due to intrinsic factor deficiency- H/H are normal. -     CBC with Differential/Platelet; Future -     Folate; Future  Essential hypertension, benign- Her BP is well controlled. -     Basic metabolic panel; Future  Pre-diabetes - A1C is 6.3%. -     Basic metabolic panel; Future -     Hemoglobin A1c; Future  Encounter for general adult medical examination with abnormal findings- Exam completed, labs reviewed, vaccines  reviewed and updated, cancer screenings are up-to-date, patient education was given.     Follow-up: Return in about 6 months (around 05/05/2023).  Sanda Linger, MD

## 2022-11-04 ENCOUNTER — Other Ambulatory Visit: Payer: Self-pay | Admitting: *Deleted

## 2022-11-04 DIAGNOSIS — N950 Postmenopausal bleeding: Secondary | ICD-10-CM

## 2022-11-04 DIAGNOSIS — Z9889 Other specified postprocedural states: Secondary | ICD-10-CM

## 2022-11-07 DIAGNOSIS — Z0001 Encounter for general adult medical examination with abnormal findings: Secondary | ICD-10-CM | POA: Insufficient documentation

## 2022-11-17 ENCOUNTER — Other Ambulatory Visit: Payer: Self-pay | Admitting: Internal Medicine

## 2022-11-17 DIAGNOSIS — I10 Essential (primary) hypertension: Secondary | ICD-10-CM

## 2022-11-21 ENCOUNTER — Other Ambulatory Visit: Payer: Self-pay | Admitting: Internal Medicine

## 2022-11-21 DIAGNOSIS — E669 Obesity, unspecified: Secondary | ICD-10-CM

## 2022-11-23 ENCOUNTER — Other Ambulatory Visit: Payer: Self-pay | Admitting: Internal Medicine

## 2022-11-23 DIAGNOSIS — E669 Obesity, unspecified: Secondary | ICD-10-CM

## 2022-11-23 DIAGNOSIS — I10 Essential (primary) hypertension: Secondary | ICD-10-CM

## 2022-11-23 MED ORDER — TRIAMTERENE-HCTZ 37.5-25 MG PO CAPS: 1.0000 | ORAL_CAPSULE | Freq: Every day | ORAL | 0 refills | Status: DC

## 2022-11-23 MED ORDER — PHENTERMINE HCL 37.5 MG PO CAPS: 37.5000 mg | ORAL_CAPSULE | Freq: Every morning | ORAL | 0 refills | Status: DC

## 2022-11-24 ENCOUNTER — Ambulatory Visit: Payer: Federal, State, Local not specified - PPO | Admitting: Family Medicine

## 2022-11-24 NOTE — Progress Notes (Deleted)
   Rubin Payor, PhD, LAT, ATC acting as a scribe for Clementeen Graham, MD.  KEMARIA DEDIC is a 57 y.o. female who presents to [facility] today for 1 month follow-up chronic bilateral low back pain.  Patient was last seen by Dr. Denyse Amass on 10/20/2022 and was prescribed tizanidine, advised to use a heating pad, TENS unit, and was referred to [facility] PT. Today, pt reports ***  Dx imaging: 10/13/2022 L-spine MRI  06/27/2022 L-spine CT  06/23/2022 L-spine x-ray  Pertinent review of systems: ***  Relevant historical information: ***   Exam:  There were no vitals taken for this visit. General: Well Developed, well nourished, and in no acute distress.   MSK: ***    Lab and Radiology Results No results found for this or any previous visit (from the past 72 hour(s)). No results found.     Assessment and Plan: 57 y.o. female with ***   PDMP not reviewed this encounter. No orders of the defined types were placed in this encounter.  No orders of the defined types were placed in this encounter.    Discussed warning signs or symptoms. Please see discharge instructions. Patient expresses understanding.   ***

## 2022-12-29 ENCOUNTER — Other Ambulatory Visit: Payer: Self-pay | Admitting: Family Medicine

## 2022-12-29 ENCOUNTER — Other Ambulatory Visit: Payer: Self-pay | Admitting: Internal Medicine

## 2022-12-29 DIAGNOSIS — E669 Obesity, unspecified: Secondary | ICD-10-CM

## 2022-12-29 DIAGNOSIS — F418 Other specified anxiety disorders: Secondary | ICD-10-CM

## 2022-12-29 DIAGNOSIS — I10 Essential (primary) hypertension: Secondary | ICD-10-CM

## 2022-12-31 NOTE — Telephone Encounter (Signed)
Last OV 10/20/22 Next OV 11/24/22 - NO SHOW  Last refill 10/20/22 Qty # 60/1

## 2023-01-08 ENCOUNTER — Other Ambulatory Visit: Payer: Self-pay

## 2023-01-08 ENCOUNTER — Ambulatory Visit (INDEPENDENT_AMBULATORY_CARE_PROVIDER_SITE_OTHER): Payer: Federal, State, Local not specified - PPO | Admitting: Obstetrics and Gynecology

## 2023-01-08 ENCOUNTER — Encounter: Payer: Self-pay | Admitting: Obstetrics and Gynecology

## 2023-01-08 VITALS — BP 141/93 | HR 89 | Wt 191.4 lb

## 2023-01-08 DIAGNOSIS — N920 Excessive and frequent menstruation with regular cycle: Secondary | ICD-10-CM

## 2023-01-08 DIAGNOSIS — N941 Unspecified dyspareunia: Secondary | ICD-10-CM | POA: Diagnosis not present

## 2023-01-08 DIAGNOSIS — N951 Menopausal and female climacteric states: Secondary | ICD-10-CM | POA: Diagnosis not present

## 2023-01-08 NOTE — Progress Notes (Signed)
NEW GYNECOLOGY PATIENT Patient name: Mary Sanford MRN [id number]  Date of birth: [birth year] Chief Complaint:   abnormal bleeding and new gyn     History:  Mary Sanford is a 57 y.o. [id number] being seen today for postmenopausal spotting. Marland Kitchen  Hx of endometrial abaltion and had some spotting.   Has not seen it in about 3 months and occurred about 6 or so months ago. Reports having pain during intercourse and may have seen spotting after intercourse maybe once or twice. When she did have spotting it would be around the time she would have expected a menses with typical menstrual back pain.   Also notes having pain with intercourse - stinging sensation with insertion and vaginal dryness. Using lubricant. 1 partner - husband.  Has been having hot flashes as well      Gynecologic History No LMP recorded. Patient is perimenopausal. Contraception: tubal ligation Last Pap:     Component Value Date/Time   DIAGPAP  09/30/2022 1201    - Negative for intraepithelial lesion or malignancy (NILM)   DIAGPAP  07/06/2019 1552    - Negative for intraepithelial lesion or malignancy (NILM)   DIAGPAP (A) 06/30/2016 0000    ATYPICAL SQUAMOUS CELLS OF UNDETERMINED SIGNIFICANCE (ASC-US).   HPVHIGH Negative 09/30/2022 1201   HPVHIGH Negative 07/06/2019 1552   ADEQPAP  09/30/2022 1201    Satisfactory for evaluation; transformation zone component PRESENT.   ADEQPAP  07/06/2019 1552    Satisfactory for evaluation; transformation zone component PRESENT.   ADEQPAP (A) 06/30/2016 0000    Satisfactory for evaluation  endocervical/transformation zone component PRESENT.    High Risk HPV: Positive  Adequacy:  Satisfactory for evaluation, transformation zone component PRESENT  Diagnosis:  Atypical squamous cells of undetermined significance (ASC-US) HPV Last Mammogram: 10/2022, BIRADS 1 Last Colonoscopy: 2022  Obstetric History OB History  Gravida Para Term Preterm AB Living  7 5 4 1 2 5   SAB IAB Ectopic  Multiple Live Births  0 2 0 0 5    # Outcome Date GA Lbr Len/2nd Weight Sex Delivery Anes PTL Lv  7 IAB           6 IAB           5 Preterm      CS-LTranv   LIV  4 Term      Vag-Spont   LIV  3 Term      Vag-Spont   LIV  2 Term      Vag-Spont   LIV  1 Term      Vag-Spont   LIV    Past Medical History:  Diagnosis Date   Allergy    seasonal allergies   Anemia    Anxiety    Arthritis    hands, left knee   COVID    Depression    Family history of adverse reaction to anesthesia    one sister ponv and slow to awaken   History of iron deficiency anemia    History of kidney stones    Hypertension    followed by pcp  (12-05-2019 pt had ETT 12-11-2011 , in epic, showed normal with no ischemia)   Nephrolithiasis    Panic disorder    PMB (postmenopausal bleeding)    Pre-diabetes    followed by pcp   Seasonal allergies    SUI (stress urinary incontinence, female)    Uterine fibroid    Wears glasses     Past Surgical History:  Procedure Laterality  Date   BUNIONECTOMY Bilateral    CESAREAN SECTION  1989   CYSTOSCOPY W/ RETROGRADES Left 06/28/2018   Procedure: CYSTOSCOPY WITH RETROGRADE PYELOGRAM LEFT STENT;  Surgeon: Rene Paci, MD;  Location: [facility];  Service: Urology;  Laterality: Left;   CYSTOSCOPY/URETEROSCOPY/HOLMIUM LASER/STENT PLACEMENT Left 07/16/2018   Procedure: CYSTOSCOPY/URETEROSCOPY/HOLMIUM LASER/STENT EXCHANGE;  Surgeon: Rene Paci, MD;  Location: [facility];  Service: Urology;  Laterality: Left;   DILATATION & CURETTAGE/HYSTEROSCOPY WITH MYOSURE N/A 12/13/2019   Procedure: DILATATION & CURETTAGE/HYSTEROSCOPY WITH MYOSURE;  Surgeon: Romualdo Bolk, MD;  Location: [facility];  Service: Gynecology;  Laterality: N/A;   DILITATION & CURRETTAGE/HYSTROSCOPY WITH NOVASURE ABLATION N/A 06/17/2013   Procedure: DILATATION & CURETTAGE/HYSTEROSCOPY WITH NOVASURE ABLATION;  Surgeon: Tereso Newcomer, MD;   Location: [facility];  Service: Gynecology;  Laterality: N/A;   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   Procedure: LAPAROSCOPIC ROUX-EN-Y GASTRIC BYPASS WITH UPPER ENDOSCOPY;  Surgeon: Gaynelle Adu, MD;  Location: [facility];  Service: General;  Laterality: N/A;   HEMORRHOIDECTOMY WITH HEMORRHOID BANDING  10/19/2012   SCA THD Hem ligation/pexy   OPERATIVE ULTRASOUND N/A 12/13/2019   Procedure: OPERATIVE ULTRASOUND;  Surgeon: Romualdo Bolk, MD;  Location: [facility];  Service: Gynecology;  Laterality: N/A;   Thumb surgery   04/2021   TUBAL LIGATION Bilateral 2001   UPPER GASTROINTESTINAL ENDOSCOPY  10/11/2020   WISDOM TOOTH EXTRACTION      Current Outpatient Medications on File Prior to Visit  Medication Sig Dispense Refill   acetaminophen (TYLENOL) 500 MG tablet Take 500-1,000 mg by mouth as needed for headache or moderate pain.     albuterol (VENTOLIN HFA) 108 (90 Base) MCG/ACT inhaler Inhale 1-2 puffs into the lungs every 6 (six) hours as needed for wheezing or shortness of breath (for seasonal allergies). (Patient taking differently: Inhale 1-2 puffs into the lungs as needed for wheezing or shortness of breath (for seasonal allergies).) 18 g 3   celecoxib (CELEBREX) 100 MG capsule TAKE 1 CAPSULE BY MOUTH TWICE DAILY AS NEEDED 60 capsule 0   escitalopram (LEXAPRO) 20 MG tablet Take 1 tablet by mouth once daily 90 tablet 0   gabapentin (NEURONTIN) 100 MG capsule Take 100 mg by mouth 3 (three) times daily.     nebivolol (BYSTOLIC) 5 MG tablet Take 1 tablet by mouth once daily 90 tablet 0   phentermine 37.5 MG capsule Take 1 capsule by mouth in the morning 30 capsule 0   tiZANidine (ZANAFLEX) 2 MG tablet TAKE 1 TO 2 TABLETS BY MOUTH EVERY 8 HOURS AS NEEDED FOR MUSCLE SPASM 60 tablet 0   triamterene-hydrochlorothiazide (DYAZIDE) 37.5-25 MG capsule Take 1 each (1 capsule total) by mouth daily. 90 capsule 0   [DISCONTINUED] potassium chloride (K-DUR) 10 MEQ tablet Take 1 tablet (10 mEq  total) by mouth 2 (two) times daily. 60 tablet 11   No current facility-administered medications on file prior to visit.    Allergies  Allergen Reactions   Naproxen Other (See Comments)    Panic attacks No Problem with Motrin    Social History:  reports that she has been smoking cigarettes. She has never used smokeless tobacco. She reports that she does not currently use alcohol. She reports that she does not use drugs.  Family History  Problem Relation Age of Onset   Hypertension Mother    Hypothyroidism Mother    Rectal cancer Mother 29   Colon cancer Mother 81   Colon polyps Mother  Hypotension Sister    Hypothyroidism Sister    Diabetes Sister    Other Sister        pacemaker   Seizures Brother    Hypothyroidism Sister    Seizures Sister    Anemia Sister    Deep vein thrombosis Sister    Seizures Paternal Grandfather    Stroke Maternal Uncle        > 55   Diabetes Maternal Grandmother    Kidney failure Maternal Grandmother    Heart attack Maternal Grandfather    Other Paternal Grandmother        tumors throughout body   Hypertension Maternal Uncle    Esophageal cancer Neg Hx    Stomach cancer Neg Hx    Heart disease Neg Hx     The following portions of the patient's history were reviewed and updated as appropriate: allergies, current medications, past family history, past medical history, past social history, past surgical history and problem list.  Review of Systems Pertinent items noted in HPI and remainder of comprehensive ROS otherwise negative.  Physical Exam:  BP (!) 138/96   Pulse 96   Wt 191 lb 6.4 oz (86.8 kg)   BMI 28.26 kg/m  Physical Exam Vitals and nursing note reviewed. Exam conducted with a chaperone present.  Constitutional:      Appearance: Normal appearance.  Cardiovascular:     Rate and Rhythm: Normal rate.  Pulmonary:     Effort: Pulmonary effort is normal.     Breath sounds: Normal breath sounds.  Genitourinary:    General:  Normal vulva.     Exam position: Lithotomy position.     Comments: No allodynia of vaginal introitus  Normal urethral meatus  Pale vaginal mucosa  Neurological:     General: No focal deficit present.     Mental Status: She is alert and oriented to person, place, and time.  Psychiatric:        Mood and Affect: Mood normal.        Behavior: Behavior normal.        Thought Content: Thought content normal.        Judgment: Judgment normal.       Assessment and Plan:   1. Spotting Resolved  2. Dyspareunia in female Secondary to vaginal dryness, discussed use of lubricants, coconut oil, and vaginal estrogen. Vaginal dryness and discomfort only notable during intercourse and improves with lubricant use. Recommend use of lubricant with each sexual encounter.   3. Vasomotor symptoms due to menopause Notes to having bothersome hot flashes. Briefly reviewed option of hormone replacement therapy vs non-hormonal options for managing hot flashes. She would prefer to do some research and try more homeopathic regimens including avoidance of possible triggers prior to considering medication. Reviewed there are several different changes that can occur with menopause. Answered all questions.   Routine preventative health maintenance measures emphasized. Please refer to After Visit Summary for other counseling recommendations.   Follow-up: Return if symptoms worsen or fail to improve, for Annual GYN.      Lorriane Shire, MD Obstetrician & Gynecologist, Faculty Practice [facility] [employer], [facility]

## 2023-01-26 ENCOUNTER — Other Ambulatory Visit: Payer: Self-pay | Admitting: Family Medicine

## 2023-01-26 NOTE — Telephone Encounter (Signed)
Last OV 10/20/22 Next OV 11/24/22 - NO SHOW  Last refill 12/31/22 Qty# 60/0

## 2023-02-26 ENCOUNTER — Other Ambulatory Visit: Payer: Self-pay | Admitting: Family Medicine

## 2023-02-26 ENCOUNTER — Other Ambulatory Visit: Payer: Self-pay | Admitting: Internal Medicine

## 2023-02-26 DIAGNOSIS — I10 Essential (primary) hypertension: Secondary | ICD-10-CM

## 2023-02-26 DIAGNOSIS — M545 Low back pain, unspecified: Secondary | ICD-10-CM

## 2023-02-26 DIAGNOSIS — M159 Polyosteoarthritis, unspecified: Secondary | ICD-10-CM

## 2023-02-26 DIAGNOSIS — E669 Obesity, unspecified: Secondary | ICD-10-CM

## 2023-02-26 NOTE — Telephone Encounter (Signed)
Called and spoke with patient. She continues to have lower back pain. She has done PT in the past with [facility], did not have long-term relief of sx with this treatment modality. Pt is open to back injection.   Will plan to refill Tizanidine and place order for ESI.   Pt will contact the office to f/u if no improvement after ESI.

## 2023-02-26 NOTE — Addendum Note (Signed)
Addended by: Dierdre Searles on: 02/26/2023 08:24 AM   Modules accepted: Orders

## 2023-02-26 NOTE — Telephone Encounter (Signed)
Last OV 10/20/22 Next OV 11/24/22 - N/S  Last refill: 01/27/23 Qty #60/0   Per visit note 10/20/22:  Assessment and Plan: 57 y.o. female with chronic low back pain with an acute exacerbation.  I believe the majority of her pain is due to muscular dysfunction.  She is a good candidate for trial of physical therapy especially for core strengthening and perhaps dry needling.  For the acute flareup this should be quite helpful.  Recommend also tizanidine which I prescribed today.  Also recommend heating pad and TENS unit.  If this does not work then we can try facet injections or perhaps epidural steroid injections but her back MRI is largely reassuring and there is not obvious targets on MRI that would clearly explain the source of her pain.

## 2023-03-05 ENCOUNTER — Other Ambulatory Visit: Payer: Self-pay | Admitting: Internal Medicine

## 2023-03-05 DIAGNOSIS — I10 Essential (primary) hypertension: Secondary | ICD-10-CM

## 2023-04-07 ENCOUNTER — Other Ambulatory Visit: Payer: Self-pay | Admitting: Internal Medicine

## 2023-04-07 DIAGNOSIS — E669 Obesity, unspecified: Secondary | ICD-10-CM

## 2023-04-07 DIAGNOSIS — F418 Other specified anxiety disorders: Secondary | ICD-10-CM

## 2023-04-07 DIAGNOSIS — I10 Essential (primary) hypertension: Secondary | ICD-10-CM

## 2023-05-11 ENCOUNTER — Ambulatory Visit: Payer: Federal, State, Local not specified - PPO

## 2023-05-12 ENCOUNTER — Ambulatory Visit: Payer: Federal, State, Local not specified - PPO | Admitting: Family Medicine

## 2023-05-12 NOTE — Progress Notes (Deleted)
   Rubin Payor, PhD, LAT, ATC acting as a scribe for Clementeen Graham, MD.  Mary Sanford is a 57 y.o. female who presents to [facility] at [facility] today for cont'd chronic bilateral low back pain.  Patient was last seen by Dr. Denyse Amass on 10/20/2022 and was prescribed tizanidine, advised to use a heating pad, TENS unit, and was referred to [facility] PT. Pt no-showed her 1 month f/u visit in April  Today, pt reports ***  Dx imaging: 10/13/2022 L-spine MRI  06/27/2022 L-spine CT  06/23/2022 L-spine x-ray  Pertinent review of systems: ***  Relevant historical information: ***   Exam:  There were no vitals taken for this visit. General: Well Developed, well nourished, and in no acute distress.   MSK: ***    Lab and Radiology Results No results found for this or any previous visit (from the past 72 hour(s)). No results found.     Assessment and Plan: 57 y.o. female with ***   PDMP not reviewed this encounter. No orders of the defined types were placed in this encounter.  No orders of the defined types were placed in this encounter.    Discussed warning signs or symptoms. Please see discharge instructions. Patient expresses understanding.   ***

## 2023-05-13 ENCOUNTER — Encounter: Payer: Self-pay | Admitting: Family Medicine

## 2023-05-13 ENCOUNTER — Other Ambulatory Visit: Payer: Self-pay

## 2023-05-13 ENCOUNTER — Ambulatory Visit (INDEPENDENT_AMBULATORY_CARE_PROVIDER_SITE_OTHER): Payer: Federal, State, Local not specified - PPO | Admitting: Family Medicine

## 2023-05-13 VITALS — BP 130/86 | HR 93 | Ht 69.0 in | Wt 187.0 lb

## 2023-05-13 DIAGNOSIS — G8929 Other chronic pain: Secondary | ICD-10-CM

## 2023-05-13 DIAGNOSIS — M545 Low back pain, unspecified: Secondary | ICD-10-CM | POA: Diagnosis not present

## 2023-05-13 DIAGNOSIS — M47816 Spondylosis without myelopathy or radiculopathy, lumbar region: Secondary | ICD-10-CM | POA: Diagnosis not present

## 2023-05-13 DIAGNOSIS — M533 Sacrococcygeal disorders, not elsewhere classified: Secondary | ICD-10-CM

## 2023-05-13 MED ORDER — CELECOXIB 100 MG PO CAPS: 100.0000 mg | ORAL_CAPSULE | Freq: Two times a day (BID) | ORAL | 0 refills | Status: DC | PRN

## 2023-05-13 MED ORDER — TIZANIDINE HCL 2 MG PO TABS: 2.0000 mg | ORAL_TABLET | Freq: Three times a day (TID) | ORAL | 1 refills | Status: DC | PRN

## 2023-05-13 NOTE — Progress Notes (Unsigned)
I, Stevenson Clinch, CMA acting as a scribe for Clementeen Graham, MD.   Mary Sanford is a 57 y.o. female who presents to [facility] at [facility] today for 1 month follow-up chronic bilateral low back pain.  Patient was last seen by Dr. Denyse Amass on 10/20/2022 and was prescribed tizanidine, advised to use a heating pad, TENS unit, and was referred to [facility] PT. Today, pt reports continued lower back pain. Has been discharged from PT. Yesterday was a really bad day. Sx flared by gardening, picking up grandchild. Notes being 2 lbs away from goal weight.   Dx imaging: 10/13/2022 L-spine MRI  06/27/2022 L-spine CT  06/23/2022 L-spine x-ray   Pertinent review of systems: No fevers or chills  Relevant historical information: Hypertension   Exam:  BP 130/86   Pulse 93   Ht 5\' 9"  (1.753 m)   Wt 187 lb (84.8 kg)   SpO2 98%   BMI 27.62 kg/m  General: Well Developed, well nourished, and in no acute distress.   MSK: L-spine: Normal appearing Nontender palpation midline. Tender palpation right SI joint. Decreased lumbar motion. Lower extremity strength is intact.    Lab and Radiology Results  Procedure: Real-time Ultrasound Guided Injection of right SI joint Device: Philips Affiniti 50G/GE Logiq Images permanently stored and available for review in PACS Verbal informed consent obtained.  Discussed risks and benefits of procedure. Warned about infection, bleeding, hyperglycemia damage to structures among others. Patient expresses understanding and agreement Time-out conducted.   Noted no overlying erythema, induration, or other signs of local infection.   Skin prepped in a sterile fashion.   Local anesthesia: Topical Ethyl chloride.   With sterile technique and under real time ultrasound guidance: 40 mg of Kenalog and 2 mL of Marcaine injected into SI joint. Fluid seen entering the joint capsule.   Completed without difficulty   Pain mild to moderately resolved  suggesting accurate placement of the medication.   Advised to call if fevers/chills, erythema, induration, drainage, or persistent bleeding.   Images permanently stored and available for review in the ultrasound unit.  Impression: Technically successful ultrasound guided injection.   EXAM: MRI LUMBAR SPINE WITHOUT CONTRAST   TECHNIQUE: Multiplanar, multisequence MR imaging of the lumbar spine was performed. No intravenous contrast was administered.   COMPARISON:  Lumbar CT 06/27/2022   FINDINGS: Segmentation:  Standard.   Alignment:  Physiologic.   Vertebrae:  No fracture, evidence of discitis, or bone lesion.   Conus medullaris and cauda equina: Conus extends to the L2 level. Conus and cauda equina appear normal.   Paraspinal and other soft tissues: Negative for perispinal mass or inflammation.   Disc levels:   L3-L4: Mild disc bulging.   L4-L5: Relative disc desiccation and narrowing with central protrusion at an annular fissure. No neural compression. Borderline facet spurring.   L5-S1:Borderline facet spurring.   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 No nerve root compression.     Electronically Signed   By: Tiburcio Pea M.D.   On: 10/15/2022 04:06 I, Clementeen Graham, personally (independently) visualized and performed the interpretation of the images attached in this note.      Assessment and Plan: 57 y.o. female with right low back pain.  Pain is multifactorial.  I think a lot of her pain is coming from the facet joints on the right side at the bottom of the spine.  This would be a right L4-5 and right L5-S1.  She did not have great immediate pain response to right SI joint injection so this is probably not her pain generator.  Plan for facet injection in the near future.  Consider a retrial of formal physical therapy.  We discussed medication management.  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  She has been  taking ibuprofen.  I have prescribed Celebrex as that is at least a bit safer than ibuprofen for her.  I stated that Celebrex is not safe in bariatric surgery but it is definitely safer than ibuprofen.  Recommend Tylenol and very limited Celebrex as needed.  PDMP not reviewed this encounter. Orders Placed This Encounter  Procedures   [place] LIMITED JOINT SPACE STRUCTURES LOW LEFT(NO LINKED CHARGES)    Order Specific Question:   Reason for Exam (SYMPTOM  OR DIAGNOSIS REQUIRED)    Answer:   SI joint pain    Order Specific Question:   Preferred imaging location?    Answer:   Adult nurse [facility] FACET JT INJ L /S SINGLE LEVEL RIGHT W/FL/CT    L4-L5, L5-S1    Standing Status:   Future    Standing Expiration Date:   06/13/2023    Order Specific Question:   Reason for Exam (SYMPTOM  OR DIAGNOSIS REQUIRED)    Answer:   low back pain    Order Specific Question:   Is the patient pregnant?    Answer:   No    Order Specific Question:   Preferred Imaging Location?    Answer:   [facility]   Meds ordered this encounter  Medications   tiZANidine (ZANAFLEX) 2 MG tablet    Sig: Take 1-2 tablets (2-4 mg total) by mouth every 8 (eight) hours as needed for muscle spasms.    Dispense:  60 tablet    Refill:  1   celecoxib (CELEBREX) 100 MG capsule    Sig: Take 1 capsule (100 mg total) by mouth 2 (two) times daily as needed.    Dispense:  60 capsule    Refill:  0     Discussed warning signs or symptoms. Please see discharge instructions. Patient expresses understanding.   The above documentation has been reviewed and is accurate and complete Clementeen Graham, M.D.

## 2023-05-13 NOTE — Patient Instructions (Addendum)
Thank you for coming in today.  You received an injection today. Seek immediate medical attention if the joint becomes red, extremely painful, or is oozing fluid.   Please call [facility] ([facility]) at [phone] to schedule your spine injection.    Let me know how this goes.

## 2023-05-14 ENCOUNTER — Other Ambulatory Visit: Payer: Self-pay | Admitting: Family Medicine

## 2023-05-14 ENCOUNTER — Encounter: Payer: Self-pay | Admitting: Family Medicine

## 2023-05-14 DIAGNOSIS — G8929 Other chronic pain: Secondary | ICD-10-CM

## 2023-05-23 ENCOUNTER — Other Ambulatory Visit: Payer: Self-pay | Admitting: Internal Medicine

## 2023-05-23 DIAGNOSIS — I10 Essential (primary) hypertension: Secondary | ICD-10-CM

## 2023-05-23 DIAGNOSIS — F418 Other specified anxiety disorders: Secondary | ICD-10-CM

## 2023-05-23 DIAGNOSIS — E669 Obesity, unspecified: Secondary | ICD-10-CM

## 2023-05-25 ENCOUNTER — Other Ambulatory Visit: Payer: Self-pay | Admitting: Internal Medicine

## 2023-05-25 DIAGNOSIS — F418 Other specified anxiety disorders: Secondary | ICD-10-CM

## 2023-06-11 ENCOUNTER — Encounter: Payer: Self-pay | Admitting: Internal Medicine

## 2023-06-11 ENCOUNTER — Ambulatory Visit: Payer: Federal, State, Local not specified - PPO | Admitting: Internal Medicine

## 2023-06-11 VITALS — BP 160/108 | HR 83 | Temp 98.2°F | Resp 16 | Ht 69.0 in | Wt 191.2 lb

## 2023-06-11 DIAGNOSIS — D510 Vitamin B12 deficiency anemia due to intrinsic factor deficiency: Secondary | ICD-10-CM | POA: Diagnosis not present

## 2023-06-11 DIAGNOSIS — Z23 Encounter for immunization: Secondary | ICD-10-CM | POA: Diagnosis not present

## 2023-06-11 DIAGNOSIS — I10 Essential (primary) hypertension: Secondary | ICD-10-CM | POA: Diagnosis not present

## 2023-06-11 DIAGNOSIS — R7303 Prediabetes: Secondary | ICD-10-CM

## 2023-06-11 DIAGNOSIS — E785 Hyperlipidemia, unspecified: Secondary | ICD-10-CM

## 2023-06-11 LAB — BASIC METABOLIC PANEL
BUN: 20 mg/dL (ref 6–23)
CO2: 31 meq/L (ref 19–32)
Calcium: 9.9 mg/dL (ref 8.4–10.5)
Chloride: 101 meq/L (ref 96–112)
Creatinine, Ser: 0.82 mg/dL (ref 0.40–1.20)
GFR: 80.15 mL/min (ref >60.00)
Glucose, Bld: 94 mg/dL (ref 70–99)
Potassium: 4.2 meq/L (ref 3.5–5.1)
Sodium: 139 meq/L (ref 135–145)

## 2023-06-11 LAB — HEPATIC FUNCTION PANEL
ALT: 34 U/L (ref 0–35)
AST: 22 U/L (ref 0–37)
Albumin: 4.3 g/dL (ref 3.5–5.2)
Alkaline Phosphatase: 69 U/L (ref 39–117)
Bilirubin, Direct: 0 mg/dL (ref 0.0–0.3)
Total Bilirubin: 0.4 mg/dL (ref 0.2–1.2)
Total Protein: 7.2 g/dL (ref 6.0–8.3)

## 2023-06-11 LAB — CBC WITH DIFFERENTIAL/PLATELET
Basophils Absolute: 0.1 10*3/uL (ref 0.0–0.1)
Basophils Relative: 0.9 % (ref 0.0–3.0)
Eosinophils Absolute: 0.1 10*3/uL (ref 0.0–0.7)
Eosinophils Relative: 2 % (ref 0.0–5.0)
HCT: 44 % (ref 36.0–46.0)
Hemoglobin: 13.7 g/dL (ref 12.0–15.0)
Lymphocytes Relative: 44.4 % (ref 12.0–46.0)
Lymphs Abs: 3 10*3/uL (ref 0.7–4.0)
MCHC: 31.1 g/dL (ref 30.0–36.0)
MCV: 76 fL — ABNORMAL LOW (ref 78.0–100.0)
Monocytes Absolute: 0.8 10*3/uL (ref 0.1–1.0)
Monocytes Relative: 11.8 % (ref 3.0–12.0)
Neutro Abs: 2.7 10*3/uL (ref 1.4–7.7)
Neutrophils Relative %: 40.9 % — ABNORMAL LOW (ref 43.0–77.0)
Platelets: 266 10*3/uL (ref 150.0–400.0)
RBC: 5.79 Mil/uL — ABNORMAL HIGH (ref 3.87–5.11)
RDW: 14.2 % (ref 11.5–15.5)
WBC: 6.6 10*3/uL (ref 4.0–10.5)

## 2023-06-11 LAB — HEMOGLOBIN A1C: Hgb A1c MFr Bld: 6.2 % (ref 4.6–6.5)

## 2023-06-11 LAB — VITAMIN B12: Vitamin B-12: 343 pg/mL (ref 211–911)

## 2023-06-11 LAB — TSH: TSH: 1.12 u[IU]/mL (ref 0.35–5.50)

## 2023-06-11 NOTE — Progress Notes (Signed)
Subjective:  Patient ID: Mary Sanford, female    DOB: [birth year]  Age: 57 y.o. MRN: [id number]  CC: Hypertension   HPI LANDREA VUOLO presents for f/up ---  Discussed the use of AI scribe software for clinical note transcription with the patient, who gave verbal consent to proceed.  History of Present Illness   The patient, with a history of hypertension, presents with persistently elevated blood pressure readings, the most recent being 160/108. They report feeling constantly tired and experiencing difficulty sleeping. Accompanying symptoms include frequent headaches and blurred vision, the latter of which they attribute to the loss of their glasses. They deny any chest pain or shortness of breath.  The patient is currently on a regimen of Dyazide and Nebivolol for blood pressure control. Despite this, they believe their elevated blood pressure is due to stress. They describe a hectic lifestyle, with numerous familial and professional responsibilities, including frequent long-distance travel to care for a sick relative. They also mention a lack of regular exercise, with gardening being their only physical activity.  The patient has previously received B12 injections, which they stopped at some point. They express a desire to resume these injections, citing their current fatigue. They also mention a history of kidney stones but do not elaborate further on this condition.          Outpatient Medications Prior to Visit  Medication Sig Dispense Refill   acetaminophen (TYLENOL) 500 MG tablet Take 500-1,000 mg by mouth as needed for headache or moderate pain.     celecoxib (CELEBREX) 100 MG capsule Take 1 capsule (100 mg total) by mouth 2 (two) times daily as needed. 60 capsule 0   escitalopram (LEXAPRO) 20 MG tablet Take 1 tablet by mouth once daily 30 tablet 0   tiZANidine (ZANAFLEX) 2 MG tablet Take 1-2 tablets (2-4 mg total) by mouth every 8 (eight) hours as needed for muscle spasms. 60  tablet 1   albuterol (VENTOLIN HFA) 108 (90 Base) MCG/ACT inhaler Inhale 1-2 puffs into the lungs every 6 (six) hours as needed for wheezing or shortness of breath (for seasonal allergies). (Patient taking differently: Inhale 1-2 puffs into the lungs as needed for wheezing or shortness of breath (for seasonal allergies).) 18 g 3   nebivolol (BYSTOLIC) 5 MG tablet Take 1 tablet by mouth once daily 30 tablet 0   phentermine 37.5 MG capsule Take 1 capsule by mouth in the morning 30 capsule 0   triamterene-hydrochlorothiazide (DYAZIDE) 37.5-25 MG capsule Take 1 capsule by mouth once daily 90 capsule 0   No facility-administered medications prior to visit.    ROS Review of Systems  Constitutional:  Positive for fatigue. Negative for appetite change, chills, diaphoresis and unexpected weight change.  HENT: Negative.    Eyes:  Positive for visual disturbance. Negative for photophobia.  Respiratory:  Negative for cough, chest tightness, shortness of breath and wheezing.   Cardiovascular:  Negative for chest pain, palpitations and leg swelling.  Gastrointestinal:  Negative for abdominal pain, constipation, diarrhea, nausea and vomiting.  Genitourinary: Negative.  Negative for difficulty urinating, dysuria and hematuria.  Musculoskeletal: Negative.  Negative for back pain and myalgias.  Skin: Negative.   Neurological:  Positive for headaches. Negative for dizziness, weakness and light-headedness.  Hematological:  Negative for adenopathy. Does not bruise/bleed easily.  Psychiatric/Behavioral: Negative.      Objective:  BP (!) 160/108 (BP Location: Left Arm, Patient Position: Sitting, Cuff Size: Normal)   Pulse 83   Temp 98.2 F (  36.8 C) (Oral)   Resp 16   Ht 5\' 9"  (1.753 m)   Wt 191 lb 3.2 oz (86.7 kg)   SpO2 99%   BMI 28.24 kg/m   BP Readings from Last 3 Encounters:  06/11/23 (!) 160/108  05/13/23 130/86  01/08/23 (!) 141/93    Wt Readings from Last 3 Encounters:  06/11/23 191 lb  3.2 oz (86.7 kg)  05/13/23 187 lb (84.8 kg)  01/08/23 191 lb 6.4 oz (86.8 kg)    Physical Exam Vitals reviewed.  Constitutional:      Appearance: Normal appearance.  HENT:     Mouth/Throat:     Mouth: Mucous membranes are moist.  Eyes:     General: No scleral icterus.    Extraocular Movements: Extraocular movements intact.     Conjunctiva/sclera: Conjunctivae normal.     Pupils: Pupils are equal, round, and reactive to light.  Cardiovascular:     Rate and Rhythm: Normal rate and regular rhythm.     Heart sounds: No murmur heard.    No friction rub. No gallop.  Pulmonary:     Effort: Pulmonary effort is normal.     Breath sounds: No stridor. No wheezing, rhonchi or rales.  Abdominal:     General: Abdomen is flat.     Palpations: There is no mass.     Tenderness: There is no abdominal tenderness. There is no guarding.     Hernia: No hernia is present.  Musculoskeletal:        General: Normal range of motion.     Cervical back: Neck supple.     Right lower leg: No edema.     Left lower leg: No edema.  Lymphadenopathy:     Cervical: No cervical adenopathy.  Skin:    General: Skin is warm and dry.  Neurological:     General: No focal deficit present.     Mental Status: She is alert.  Psychiatric:        Mood and Affect: Mood normal.        Behavior: Behavior normal.     Lab Results  Component Value Date   WBC 6.6 06/11/2023   HGB 13.7 06/11/2023   HCT 44.0 06/11/2023   PLT 266.0 06/11/2023   GLUCOSE 94 06/11/2023   CHOL 142 12/26/2021   TRIG 122.0 12/26/2021   HDL 61.60 12/26/2021   LDLCALC 56 12/26/2021   ALT 34 06/11/2023   AST 22 06/11/2023   NA 139 06/11/2023   K 4.2 06/11/2023   CL 101 06/11/2023   CREATININE 0.82 06/11/2023   BUN 20 06/11/2023   CO2 31 06/11/2023   TSH 1.12 06/11/2023   INR 1.1 06/27/2022   HGBA1C 6.2 06/11/2023    US Transvaginal Non-OB  Result Date: 11/06/2022 Pelvic ultrasound  Indications: PMP bleeding  Findings:  Uterus  6.81 x 5.08 x 3.80 cm 2 small intramural fibroids -1.21 x 1.17 cm -0.96 x 0.59 cm  Endometrium 3.47 mm, irregular with shadowing (s/p ablation). No obvious masses, avascular  Left ovary 2.32 x 1.52 x 1.13 cm  Right ovary 2.54 x 2.06 x 1.74 cm 1.37 x 1.18 cm simple cyst  No free fluid  Impression: Normal sized, anteverted uterus 2 small intramural fibroids (stable) Thin irregular endometrium without obvious masses Normal left ovary 1.4 cm simple cyst in the right ovary (1.7 x 1.3 cm cyst in the right ovary in 4/21)   MM 3D SCREENING MAMMOGRAM BILATERAL BREAST  Result Date: 10/30/2022 CLINICAL DATA:  Screening. EXAM: DIGITAL SCREENING BILATERAL MAMMOGRAM WITH TOMOSYNTHESIS AND CAD TECHNIQUE: Bilateral screening digital craniocaudal and mediolateral oblique mammograms were obtained. Bilateral screening digital breast tomosynthesis was performed. The images were evaluated with computer-aided detection. COMPARISON:  Previous exam(s). ACR Breast Density Category a: The breasts are almost entirely fatty. FINDINGS: There are no findings suspicious for malignancy. IMPRESSION: No mammographic evidence of malignancy. A result letter of this screening mammogram will be mailed directly to the patient. RECOMMENDATION: Screening mammogram in one year. (Code:SM-B-01Y) BI-RADS CATEGORY  1: Negative. Electronically Signed   By: Bary Richard M.D.   On: 10/30/2022 08:52    Assessment & Plan:  Vitamin B12 deficiency anemia due to intrinsic factor deficiency -     Vitamin B12; Future  Pre-diabetes -     Hemoglobin A1c; Future -     Basic metabolic panel; Future  Need for immunization against influenza -     Flu vaccine trivalent PF, 6mos and older(Flulaval,Afluria,Fluarix,Fluzone)  Malignant hypertension- She has not achieved her blood pressure goal and she is symptomatic.  Will restart Dyazide and will increase the dose of nebivolol.  Will evaluate for secondary causes and endorgan damage. -     US RENAL ARTERY  DUPLEX COMPLETE; Future -     Basic metabolic panel; Future -     CBC with Differential/Platelet; Future -     TSH; Future -     Urinalysis, Routine w reflex microscopic; Future -     Aldosterone + renin activity w/ ratio; Future -     Hepatic function panel; Future -     Nebivolol HCl; Take 1 tablet (10 mg total) by mouth daily.  Dispense: 90 tablet; Refill: 0 -     Triamterene-HCTZ; Take 1 each (1 capsule total) by mouth daily.  Dispense: 90 capsule; Refill: 0  Dyslipidemia, goal LDL below 100- I have asked her to start a statin for CV risk reduction. -     Atorvastatin Calcium; Take 1 tablet (10 mg total) by mouth daily.  Dispense: 90 tablet; Refill: 1     Follow-up: Return in about 6 weeks (around 07/23/2023).  Sanda Linger, MD

## 2023-06-12 LAB — URINALYSIS, ROUTINE W REFLEX MICROSCOPIC
Bilirubin Urine: NEGATIVE
Hgb urine dipstick: NEGATIVE
Ketones, ur: NEGATIVE
Leukocytes,Ua: NEGATIVE
Nitrite: NEGATIVE
Specific Gravity, Urine: 1.025 (ref 1.000–1.030)
Total Protein, Urine: NEGATIVE
Urine Glucose: NEGATIVE
Urobilinogen, UA: 0.2 (ref 0.0–1.0)
pH: 6.5 (ref 5.0–8.0)

## 2023-06-17 DIAGNOSIS — E785 Hyperlipidemia, unspecified: Secondary | ICD-10-CM | POA: Insufficient documentation

## 2023-06-17 MED ORDER — TRIAMTERENE-HCTZ 37.5-25 MG PO CAPS: 1.0000 | ORAL_CAPSULE | Freq: Every day | ORAL | 0 refills | Status: DC

## 2023-06-17 MED ORDER — NEBIVOLOL HCL 10 MG PO TABS: 10.0000 mg | ORAL_TABLET | Freq: Every day | ORAL | 0 refills | Status: DC

## 2023-06-17 MED ORDER — ATORVASTATIN CALCIUM 10 MG PO TABS: 10.0000 mg | ORAL_TABLET | Freq: Every day | ORAL | 1 refills | Status: DC

## 2023-06-18 ENCOUNTER — Other Ambulatory Visit: Payer: Self-pay | Admitting: Internal Medicine

## 2023-06-18 ENCOUNTER — Ambulatory Visit
Admission: RE | Admit: 2023-06-18 | Discharge: 2023-06-18 | Disposition: A | Discharge status: Home / Self Care | Payer: Federal, State, Local not specified - PPO | Source: Ambulatory Visit | Attending: Internal Medicine | Admitting: Internal Medicine

## 2023-06-18 DIAGNOSIS — I10 Essential (primary) hypertension: Secondary | ICD-10-CM | POA: Diagnosis not present

## 2023-06-18 DIAGNOSIS — E269 Hyperaldosteronism, unspecified: Secondary | ICD-10-CM | POA: Insufficient documentation

## 2023-06-18 LAB — ALDOSTERONE + RENIN ACTIVITY W/ RATIO
ALDO / PRA Ratio: 45.2 ratio — ABNORMAL HIGH (ref 0.9–28.9)
Aldosterone: 28 ng/dL
Renin Activity: 0.62 ng/mL/h (ref 0.25–5.82)

## 2023-06-18 MED ORDER — SPIRONOLACTONE 25 MG PO TABS: 25.0000 mg | ORAL_TABLET | Freq: Every day | ORAL | 0 refills | Status: DC

## 2023-06-18 NOTE — Progress Notes (Unsigned)
Lab Results  Component Value Date   WBC 6.6 06/11/2023   HGB 13.7 06/11/2023   HCT 44.0 06/11/2023   PLT 266.0 06/11/2023   GLUCOSE 94 06/11/2023   CHOL 142 12/26/2021   TRIG 122.0 12/26/2021   HDL 61.60 12/26/2021   LDLCALC 56 12/26/2021   ALT 34 06/11/2023   AST 22 06/11/2023   NA 139 06/11/2023   K 4.2 06/11/2023   CL 101 06/11/2023   CREATININE 0.82 06/11/2023   BUN 20 06/11/2023   CO2 31 06/11/2023   TSH 1.12 06/11/2023   INR 1.1 06/27/2022   HGBA1C 6.2 06/11/2023

## 2023-06-19 ENCOUNTER — Other Ambulatory Visit: Payer: Self-pay | Admitting: Internal Medicine

## 2023-06-19 ENCOUNTER — Encounter: Payer: Self-pay | Admitting: Internal Medicine

## 2023-06-19 ENCOUNTER — Ambulatory Visit (INDEPENDENT_AMBULATORY_CARE_PROVIDER_SITE_OTHER): Payer: Federal, State, Local not specified - PPO | Admitting: Internal Medicine

## 2023-06-19 VITALS — BP 144/92 | HR 86 | Temp 98.0°F | Resp 16 | Ht 69.0 in | Wt 191.0 lb

## 2023-06-19 DIAGNOSIS — E269 Hyperaldosteronism, unspecified: Secondary | ICD-10-CM | POA: Diagnosis not present

## 2023-06-19 DIAGNOSIS — M545 Low back pain, unspecified: Secondary | ICD-10-CM

## 2023-06-19 DIAGNOSIS — I10 Essential (primary) hypertension: Secondary | ICD-10-CM | POA: Diagnosis not present

## 2023-06-19 MED ORDER — SPIRONOLACTONE 25 MG PO TABS: 25.0000 mg | ORAL_TABLET | Freq: Every day | ORAL | 0 refills | Status: DC

## 2023-06-19 MED ORDER — TIZANIDINE HCL 2 MG PO TABS: 2.0000 mg | ORAL_TABLET | Freq: Three times a day (TID) | ORAL | 2 refills | Status: DC | PRN

## 2023-06-19 NOTE — Patient Instructions (Signed)
Hypertension, Adult High blood pressure (hypertension) is when the force of blood pumping through the arteries is too strong. The arteries are the blood vessels that carry blood from the heart throughout the body. Hypertension forces the heart to work harder to pump blood and may cause arteries to become narrow or stiff. Untreated or uncontrolled hypertension can lead to a heart attack, heart failure, a stroke, kidney disease, and other problems. A blood pressure reading consists of a higher number over a lower number. Ideally, your blood pressure should be below 120/80. The first ("top") number is called the systolic pressure. It is a measure of the pressure in your arteries as your heart beats. The second ("bottom") number is called the diastolic pressure. It is a measure of the pressure in your arteries as the heart relaxes. What are the causes? The exact cause of this condition is not known. There are some conditions that result in high blood pressure. What increases the risk? Certain factors may make you more likely to develop high blood pressure. Some of these risk factors are under your control, including: Smoking. Not getting enough exercise or physical activity. Being overweight. Having too much fat, sugar, calories, or salt (sodium) in your diet. Drinking too much alcohol. Other risk factors include: Having a personal history of heart disease, diabetes, high cholesterol, or kidney disease. Stress. Having a family history of high blood pressure and high cholesterol. Having obstructive sleep apnea. Age. The risk increases with age. What are the signs or symptoms? High blood pressure may not cause symptoms. Very high blood pressure (hypertensive crisis) may cause: Headache. Fast or irregular heartbeats (palpitations). Shortness of breath. Nosebleed. Nausea and vomiting. Vision changes. Severe chest pain, dizziness, and seizures. How is this diagnosed? This condition is diagnosed by  measuring your blood pressure while you are seated, with your arm resting on a flat surface, your legs uncrossed, and your feet flat on the floor. The cuff of the blood pressure monitor will be placed directly against the skin of your upper arm at the level of your heart. Blood pressure should be measured at least twice using the same arm. Certain conditions can cause a difference in blood pressure between your right and left arms. If you have a high blood pressure reading during one visit or you have normal blood pressure with other risk factors, you may be asked to: Return on a different day to have your blood pressure checked again. Monitor your blood pressure at home for 1 week or longer. If you are diagnosed with hypertension, you may have other blood or imaging tests to help your health care provider understand your overall risk for other conditions. How is this treated? This condition is treated by making healthy lifestyle changes, such as eating healthy foods, exercising more, and reducing your alcohol intake. You may be referred for counseling on a healthy diet and physical activity. Your health care provider may prescribe medicine if lifestyle changes are not enough to get your blood pressure under control and if: Your systolic blood pressure is above 130. Your diastolic blood pressure is above 80. Your personal target blood pressure may vary depending on your medical conditions, your age, and other factors. Follow these instructions at home: Eating and drinking  Eat a diet that is high in fiber and potassium, and low in sodium, added sugar, and fat. An example of this eating plan is called the DASH diet. DASH stands for Dietary Approaches to Stop Hypertension. To eat this way: Eat   plenty of fresh fruits and vegetables. Try to fill one half of your plate at each meal with fruits and vegetables. Eat whole grains, such as whole-wheat pasta, brown rice, or whole-grain bread. Fill about one  fourth of your plate with whole grains. Eat or drink low-fat dairy products, such as skim milk or low-fat yogurt. Avoid fatty cuts of meat, processed or cured meats, and poultry with skin. Fill about one fourth of your plate with lean proteins, such as fish, chicken without skin, beans, eggs, or tofu. Avoid pre-made and processed foods. These tend to be higher in sodium, added sugar, and fat. Reduce your daily sodium intake. Many people with hypertension should eat less than 1,500 mg of sodium a day. Do not drink alcohol if: Your health care provider tells you not to drink. You are pregnant, may be pregnant, or are planning to become pregnant. If you drink alcohol: Limit how much you have to: 0-1 drink a day for women. 0-2 drinks a day for men. Know how much alcohol is in your drink. In the [place] one drink equals one 12 oz bottle of beer (355 mL), one 5 oz glass of wine (148 mL), or one 1 oz glass of hard liquor (44 mL). Lifestyle  Work with your health care provider to maintain a healthy body weight or to lose weight. Ask what an ideal weight is for you. Get at least 30 minutes of exercise that causes your heart to beat faster (aerobic exercise) most days of the week. Activities may include walking, swimming, or biking. Include exercise to strengthen your muscles (resistance exercise), such as Pilates or lifting weights, as part of your weekly exercise routine. Try to do these types of exercises for 30 minutes at least 3 days a week. Do not use any products that contain nicotine or tobacco. These products include cigarettes, chewing tobacco, and vaping devices, such as e-cigarettes. If you need help quitting, ask your health care provider. Monitor your blood pressure at home as told by your health care provider. Keep all follow-up visits. This is important. Medicines Take over-the-counter and prescription medicines only as told by your health care provider. Follow directions carefully. Blood  pressure medicines must be taken as prescribed. Do not skip doses of blood pressure medicine. Doing this puts you at risk for problems and can make the medicine less effective. Ask your health care provider about side effects or reactions to medicines that you should watch for. Contact a health care provider if you: Think you are having a reaction to a medicine you are taking. Have headaches that keep coming back (recurring). Feel dizzy. Have swelling in your ankles. Have trouble with your vision. Get help right away if you: Develop a severe headache or confusion. Have unusual weakness or numbness. Feel faint. Have severe pain in your chest or abdomen. Vomit repeatedly. Have trouble breathing. These symptoms may be an emergency. Get help right away. Call 911. Do not wait to see if the symptoms will go away. Do not drive yourself to the hospital. Summary Hypertension is when the force of blood pumping through your arteries is too strong. If this condition is not controlled, it may put you at risk for serious complications. Your personal target blood pressure may vary depending on your medical conditions, your age, and other factors. For most people, a normal blood pressure is less than 120/80. Hypertension is treated with lifestyle changes, medicines, or a combination of both. Lifestyle changes include losing weight, eating a healthy,   low-sodium diet, exercising more, and limiting alcohol. This information is not intended to replace advice given to you by your health care provider. Make sure you discuss any questions you have with your health care provider. Document Revised: 05/28/2021 Document Reviewed: 05/28/2021 Elsevier Patient Education  2024 [employer]  

## 2023-06-19 NOTE — Progress Notes (Unsigned)
   Subjective:  Patient ID: Mary Sanford, female    DOB: [birth year]  Age: 57 y.o. MRN: [id number]  CC: No chief complaint on file.   HPI [name] presents for ***  Outpatient Medications Prior to Visit  Medication Sig Dispense Refill   acetaminophen (TYLENOL) 500 MG tablet Take 500-1,000 mg by mouth as needed for headache or moderate pain.     atorvastatin (LIPITOR) 10 MG tablet Take 1 tablet (10 mg total) by mouth daily. 90 tablet 1   escitalopram (LEXAPRO) 20 MG tablet Take 1 tablet by mouth once daily 30 tablet 0   nebivolol (BYSTOLIC) 10 MG tablet Take 1 tablet (10 mg total) by mouth daily. 90 tablet 0   spironolactone (ALDACTONE) 25 MG tablet Take 1 tablet (25 mg total) by mouth daily. 90 tablet 0   celecoxib (CELEBREX) 100 MG capsule Take 1 capsule (100 mg total) by mouth 2 (two) times daily as needed. (Patient not taking: Reported on 06/19/2023) 60 capsule 0   tiZANidine (ZANAFLEX) 2 MG tablet Take 1-2 tablets (2-4 mg total) by mouth every 8 (eight) hours as needed for muscle spasms. 60 tablet 1   No facility-administered medications prior to visit.    ROS Review of Systems  Objective:  There were no vitals taken for this visit.  BP Readings from Last 3 Encounters:  06/19/23 (!) 144/92  06/11/23 (!) 160/108  05/13/23 130/86    Wt Readings from Last 3 Encounters:  06/19/23 191 lb (86.6 kg)  06/11/23 191 lb 3.2 oz (86.7 kg)  05/13/23 187 lb (84.8 kg)    Physical Exam  Lab Results  Component Value Date   WBC 6.6 06/11/2023   HGB 13.7 06/11/2023   HCT 44.0 06/11/2023   PLT 266.0 06/11/2023   GLUCOSE 94 06/11/2023   CHOL 142 12/26/2021   TRIG 122.0 12/26/2021   HDL 61.60 12/26/2021   LDLCALC 56 12/26/2021   ALT 34 06/11/2023   AST 22 06/11/2023   NA 139 06/11/2023   K 4.2 06/11/2023   CL 101 06/11/2023   CREATININE 0.82 06/11/2023   BUN 20 06/11/2023   CO2 31 06/11/2023   TSH 1.12 06/11/2023   INR 1.1 06/27/2022   HGBA1C 6.2 06/11/2023     No results found.  Assessment & Plan:  Chronic bilateral low back pain without sciatica -     tiZANidine HCl; Take 1-2 tablets (2-4 mg total) by mouth every 8 (eight) hours as needed for muscle spasms.  Dispense: 60 tablet; Refill: 2  Hyperaldosteronism (HCC)     Follow-up: No follow-ups on file.  Mary Linger, MD

## 2023-06-19 NOTE — Progress Notes (Signed)
Subjective:  Patient ID: Mary Sanford, female    DOB: 07-11-1966  Age: 57 y.o. MRN: [id number]  CC: Hypertension and Back Pain   HPI [name] presents for f/up ----  Discussed the use of AI scribe software for clinical note transcription with the patient, who gave verbal consent to proceed.  History of Present Illness   The patient, with a history of hypertension, presented with concerns about a recent high blood pressure reading of 172/107 taken at a health fair with a automated cuff. She reported feeling anxious and stressed due to both personal and professional circumstances. She denied experiencing any chest pain, shortness of breath, or dizziness. However, she did mention occasional headaches, which she attributed to stress.  The patient was also taking phentermine and expressed concerns about whether this medication could be contributing to her elevated blood pressure.  In addition, the patient was informed of a recent lab finding of hyperaldosteronism, a condition where the adrenal gland produces too much aldosterone, a hormone that can cause high blood pressure. As a result, the patient was advised to switch from Dyazide to spironolactone to better control her blood pressure.  The patient was considering a new job opportunity in a high-stress environment and was contemplating retirement. She was also planning to take a week off for stress reduction.       Outpatient Medications Prior to Visit  Medication Sig Dispense Refill   acetaminophen (TYLENOL) 500 MG tablet Take 500-1,000 mg by mouth as needed for headache or moderate pain.     atorvastatin (LIPITOR) 10 MG tablet Take 1 tablet (10 mg total) by mouth daily. 90 tablet 1   escitalopram (LEXAPRO) 20 MG tablet Take 1 tablet by mouth once daily 30 tablet 0   nebivolol (BYSTOLIC) 10 MG tablet Take 1 tablet (10 mg total) by mouth daily. 90 tablet 0   spironolactone (ALDACTONE) 25 MG tablet Take 1 tablet (25 mg total) by  mouth daily. 90 tablet 0   tiZANidine (ZANAFLEX) 2 MG tablet Take 1-2 tablets (2-4 mg total) by mouth every 8 (eight) hours as needed for muscle spasms. 60 tablet 1   celecoxib (CELEBREX) 100 MG capsule Take 1 capsule (100 mg total) by mouth 2 (two) times daily as needed. (Patient not taking: Reported on 06/19/2023) 60 capsule 0   No facility-administered medications prior to visit.    ROS Review of Systems  Constitutional:  Negative for appetite change, chills, diaphoresis and fatigue.  HENT: Negative.    Respiratory: Negative.  Negative for cough, chest tightness, shortness of breath and wheezing.   Cardiovascular:  Negative for chest pain, palpitations and leg swelling.  Gastrointestinal:  Negative for abdominal pain, diarrhea and nausea.  Endocrine: Negative.   Genitourinary: Negative.  Negative for difficulty urinating.  Musculoskeletal: Negative.  Negative for arthralgias and myalgias.  Skin:  Negative for color change and pallor.  Neurological:  Positive for headaches. Negative for dizziness, speech difficulty and weakness.  Hematological:  Negative for adenopathy. Does not bruise/bleed easily.  Psychiatric/Behavioral: Negative.  The patient is not nervous/anxious.     Objective:  BP (!) 144/92 (BP Location: Left Arm, Patient Position: Sitting, Cuff Size: Normal)   Pulse 86   Temp 98 F (36.7 C) (Oral)   Resp 16   Ht 5\' 9"  (1.753 m)   Wt 191 lb (86.6 kg)   SpO2 99%   BMI 28.21 kg/m   BP Readings from Last 3 Encounters:  06/19/23 (!) 144/92  06/11/23 Marland Kitchen)  160/108  05/13/23 130/86    Wt Readings from Last 3 Encounters:  06/19/23 191 lb (86.6 kg)  06/11/23 191 lb 3.2 oz (86.7 kg)  05/13/23 187 lb (84.8 kg)    Physical Exam Vitals reviewed.  Constitutional:      Appearance: Normal appearance.  HENT:     Mouth/Throat:     Mouth: Mucous membranes are moist.  Eyes:     General: No scleral icterus.    Conjunctiva/sclera: Conjunctivae normal.  Cardiovascular:      Rate and Rhythm: Normal rate and regular rhythm.     Heart sounds: No murmur heard.    No friction rub. No gallop.     Comments: EKG- NSR, 85 bpm No LVH, Q waves, or ST/T waves  Pulmonary:     Effort: Pulmonary effort is normal.     Breath sounds: No stridor. No wheezing, rhonchi or rales.  Abdominal:     General: Abdomen is flat.     Palpations: There is no mass.     Tenderness: There is no abdominal tenderness. There is no guarding.     Hernia: No hernia is present.  Musculoskeletal:        General: Normal range of motion.     Cervical back: Neck supple.     Right lower leg: No edema.     Left lower leg: No edema.  Lymphadenopathy:     Cervical: No cervical adenopathy.  Skin:    General: Skin is warm and dry.  Neurological:     General: No focal deficit present.     Mental Status: She is alert.  Psychiatric:        Attention and Perception: Attention normal.        Mood and Affect: Mood is anxious.        Speech: Speech normal.        Behavior: Behavior normal.        Thought Content: Thought content normal.     Lab Results  Component Value Date   WBC 6.6 06/11/2023   HGB 13.7 06/11/2023   HCT 44.0 06/11/2023   PLT 266.0 06/11/2023   GLUCOSE 94 06/11/2023   CHOL 142 12/26/2021   TRIG 122.0 12/26/2021   HDL 61.60 12/26/2021   LDLCALC 56 12/26/2021   ALT 34 06/11/2023   AST 22 06/11/2023   NA 139 06/11/2023   K 4.2 06/11/2023   CL 101 06/11/2023   CREATININE 0.82 06/11/2023   BUN 20 06/11/2023   CO2 31 06/11/2023   TSH 1.12 06/11/2023   INR 1.1 06/27/2022   HGBA1C 6.2 06/11/2023   US Renal Artery Stenosis  Result Date: 06/21/2023 CLINICAL DATA:  Malignant hypertension EXAM: RENAL/URINARY TRACT ULTRASOUND RENAL DUPLEX DOPPLER ULTRASOUND COMPARISON:  None Available. FINDINGS: Right Kidney: Length: 10.6 cm. Echogenicity within normal limits. No mass or hydronephrosis visualized. Left Kidney: Length: 10.1 cm. Echogenicity within normal limits. No mass or  hydronephrosis visualized. Bladder:  Unremarkable RENAL DUPLEX ULTRASOUND Right Renal Artery Velocities: Origin:  125 cm/sec Mid:  90 cm/sec Hilum:  108 cm/sec Interlobar:  74 cm/sec Arcuate:  64 cm/sec Left Renal Artery Velocities: Origin:  120 cm/sec Mid:  108 cm/sec Hilum:  122 cm/sec Interlobar:  104 cm/sec Arcuate:  49 cm/sec Aortic Velocity:  91 cm/sec Right Renal-Aortic Ratios: Origin: 1.4 Mid:  1.0 Hilum: 1.2 Interlobar: 0.8 Arcuate: 0.7 Left Renal-Aortic Ratios: Origin: 1.3 Mid: 1.2 Hilum: 1.3 Interlobar: 1.1 Arcuate: 0.5 IMPRESSION: 1. Normal grayscale appearance of the kidneys. 2. No Doppler  evidence of significant renal artery stenosis. Electronically Signed   By: Acquanetta Belling M.D.   On: 06/21/2023 18:53     Assessment & Plan:   Essential hypertension, benign- EKG is negative for LVH. -     EKG 12-Lead  Hyperaldosteronism (HCC)- I have asked her to see ENDO and stop dyazide/start spironolactone. -     Spironolactone; Take 1 tablet (25 mg total) by mouth daily.  Dispense: 90 tablet; Refill: 0     Follow-up: Return in about 3 months (around 09/19/2023).  Sanda Linger, MD

## 2023-06-22 ENCOUNTER — Other Ambulatory Visit: Payer: Self-pay | Admitting: Internal Medicine

## 2023-06-22 DIAGNOSIS — E269 Hyperaldosteronism, unspecified: Secondary | ICD-10-CM

## 2023-06-23 ENCOUNTER — Telehealth: Payer: Self-pay | Admitting: Internal Medicine

## 2023-06-23 NOTE — Telephone Encounter (Signed)
Spoke with patient and pharmacy.

## 2023-06-23 NOTE — Telephone Encounter (Signed)
Patient called and said Dr. Yetta Barre recently prescribed her a new medication, but her pharmacy won't fill it due to a possible issue. She said Dr. Yetta Barre already approved the interaction, but they still won't fill it.  Patient would like to know what to do. Best callback is [phone].

## 2023-06-26 ENCOUNTER — Telehealth: Payer: Self-pay

## 2023-06-26 NOTE — Progress Notes (Signed)
   Care Guide Note  06/26/2023 Name: Mary Sanford MRN: [id number] DOB: [birth year]  Referred by: Etta Grandchild, MD Reason for referral : Care Coordination (Outreach to schedule with pharm d )   Mary Sanford is a 57 y.o. year old female who is a primary care patient of Etta Grandchild, MD. Haze Boyden was referred to the pharmacist for assistance related to DM.    Successful contact was made with the patient to discuss pharmacy services including being ready for the pharmacist to call at least 5 minutes before the scheduled appointment time, to have medication bottles and any blood sugar or blood pressure readings ready for review. The patient agreed to meet with the pharmacist via with the pharmacist via telephone visit on (date/time).  07/07/2023  Penne Lash , RMA       [facility] Guide  Direct Dial: [phone]  Website: [email or web address]

## 2023-07-07 ENCOUNTER — Other Ambulatory Visit: Payer: Federal, State, Local not specified - PPO | Admitting: Pharmacist

## 2023-07-07 ENCOUNTER — Encounter: Payer: Self-pay | Admitting: Internal Medicine

## 2023-07-07 DIAGNOSIS — F418 Other specified anxiety disorders: Secondary | ICD-10-CM

## 2023-07-07 MED ORDER — ESCITALOPRAM OXALATE 10 MG PO TABS: ORAL_TABLET | ORAL | 0 refills | Status: DC

## 2023-07-07 NOTE — Progress Notes (Signed)
07/07/2023  Name: Mary Sanford MRN: [id number] DOB: [birth year]  Chief Complaint  Patient presents with   Hypertension   Medication Management    Mary Sanford is a 57 y.o. year old female who presented for a telephone visit.   They were referred to the pharmacist by their PCP for assistance in managing hypertension.   Subjective:  Care Team: Primary Care Provider: Etta Grandchild, MD ; Next Scheduled Visit: 07/22/2013 Awaiting call to schedule Endorinology consult  Medication Access/Adherence  Current Pharmacy:  [facility] [address] ([place]), [place] - [address] [place] [address] Phone: [phone] Fax: [phone]  [facility] [address] [place] [address] Phone: [phone] Fax: [phone]   Patient reports affordability concerns with their medications: No  Patient reports access/transportation concerns to their pharmacy: No  Patient reports adherence concerns with their medications:  No     Hypertension:  Current medications: nebivolol 10 mg daily (increased 11/7), spironolactone 25 mg daily (started 11/15) Medications previously tried: Dyazide (changed to spironolactone due to new dx of hyperaldosteronism from 11/7 labs)  *Pt was taking phentermine, which she stopped a few days ago  Patient does not have a validated, automated, upper arm home BP cuff Current blood pressure readings using a wrist monitor: yesterday 171/104 (75), 230/140, 211/137, 227/146  Patient denies hypotensive s/sx including dizziness, lightheadedness.  Patient denies hypertensive symptoms including headache, chest pain, shortness of breath  Anxiety/Depression: Current medication: escitalopram 20 mg daily Has been on this for several years. Feels her anxiety/depression is not controlled and asks if there is a higher dose  Objective:  BP Readings from Last 3 Encounters:  06/19/23 (!)  144/92  06/11/23 (!) 160/108  05/13/23 130/86     Lab Results  Component Value Date   HGBA1C 6.2 06/11/2023    Lab Results  Component Value Date   CREATININE 0.82 06/11/2023   BUN 20 06/11/2023   NA 139 06/11/2023   K 4.2 06/11/2023   CL 101 06/11/2023   CO2 31 06/11/2023    Lab Results  Component Value Date   CHOL 142 12/26/2021   HDL 61.60 12/26/2021   LDLCALC 56 12/26/2021   TRIG 122.0 12/26/2021   CHOLHDL 2 12/26/2021    Medications Reviewed Today     Reviewed by Bonita Quin, RPH (Pharmacist) on 07/07/23 at 1352  Med List Status: <None>   Medication Order Taking? Sig Documenting Provider Last Dose Status Informant  acetaminophen (TYLENOL) 500 MG tablet [id number]  Take 500-1,000 mg by mouth as needed for headache or moderate pain. [provider]  Active Self, Pharmacy Records, Spouse/Significant Other, Family Member  atorvastatin (LIPITOR) 10 MG tablet [id number] Yes Take 1 tablet (10 mg total) by mouth daily. Etta Grandchild, MD Taking Active   escitalopram (LEXAPRO) 20 MG tablet [id number] Yes Take 1 tablet by mouth once daily Etta Grandchild, MD Taking Active   nebivolol (BYSTOLIC) 10 MG tablet [id number] Yes Take 1 tablet (10 mg total) by mouth daily. Etta Grandchild, MD Taking Active   spironolactone (ALDACTONE) 25 MG tablet [id number] Yes TAKE 1 TABLET BY MOUTH ONCE DAILY Etta Grandchild, MD Taking Active   tiZANidine (ZANAFLEX) 2 MG tablet [id number] Yes Take 1-2 tablets (2-4 mg total) by mouth every 8 (eight) hours as needed for muscle spasms. Etta Grandchild, MD Taking Active  Assessment/Plan:   Hypertension: - Currently uncontrolled, BP goal <130/80 - Reviewed long term cardiovascular and renal outcomes of uncontrolled blood pressure - Reviewed appropriate blood pressure monitoring technique and reviewed goal blood pressure. Recommended to check home blood pressure and heart rate daily - recommend an arm monitor or get  daughter to check it manually - PCP would like to get an in office BP check prior to deciding on medication change. Nurse visit scheduled for tomorrow at 9 AM for BP check. - Waiting for endocrinology f/u  Anxiety/Depression: - Currently uncontrolled - Can increase escitalopram to 30 mg, which is max dose   Follow Up Plan: 12/10 1 week follow up  Arbutus Leas, PharmD, BCPS, CPP Clinical Pharmacist Practitioner [facility] [phone]

## 2023-07-07 NOTE — Patient Instructions (Signed)
It was a pleasure speaking with you today!  Come tomorrow at 9 AM for a blood pressure check.  I have sent in escitalopram 10 mg for you to take with your 20 mg tablet to equal 30 mg daily.  Feel free to call with any questions or concerns!  Arbutus Leas, PharmD, BCPS [facility] Clinical Pharmacist [facility] [phone]

## 2023-07-08 ENCOUNTER — Ambulatory Visit: Payer: Federal, State, Local not specified - PPO

## 2023-07-10 ENCOUNTER — Ambulatory Visit: Payer: Federal, State, Local not specified - PPO

## 2023-07-10 ENCOUNTER — Emergency Department (HOSPITAL_COMMUNITY)
Admission: EM | Admit: 2023-07-10 | Discharge: 2023-07-10 | Disposition: A | Discharge status: Home / Self Care | Payer: Federal, State, Local not specified - PPO | Attending: Emergency Medicine | Admitting: Emergency Medicine

## 2023-07-10 ENCOUNTER — Emergency Department (HOSPITAL_COMMUNITY): Payer: Federal, State, Local not specified - PPO

## 2023-07-10 ENCOUNTER — Encounter (HOSPITAL_COMMUNITY): Payer: Self-pay

## 2023-07-10 ENCOUNTER — Other Ambulatory Visit: Payer: Self-pay

## 2023-07-10 DIAGNOSIS — Z79899 Other long term (current) drug therapy: Secondary | ICD-10-CM | POA: Diagnosis not present

## 2023-07-10 DIAGNOSIS — R519 Headache, unspecified: Secondary | ICD-10-CM | POA: Diagnosis not present

## 2023-07-10 DIAGNOSIS — I10 Essential (primary) hypertension: Secondary | ICD-10-CM | POA: Diagnosis not present

## 2023-07-10 LAB — BASIC METABOLIC PANEL
Anion gap: 7 (ref 5–15)
BUN: 11 mg/dL (ref 6–20)
CO2: 25 mmol/L (ref 22–32)
Calcium: 9 mg/dL (ref 8.9–10.3)
Chloride: 105 mmol/L (ref 98–111)
Creatinine, Ser: 0.76 mg/dL (ref 0.44–1.00)
GFR, Estimated: >60 mL/min (ref >60)
Glucose, Bld: 106 mg/dL — ABNORMAL HIGH (ref 70–99)
Potassium: 3.8 mmol/L (ref 3.5–5.1)
Sodium: 137 mmol/L (ref 135–145)

## 2023-07-10 LAB — CBC
HCT: 39.9 % (ref 36.0–46.0)
Hemoglobin: 12.3 g/dL (ref 12.0–15.0)
MCH: 23.1 pg — ABNORMAL LOW (ref 26.0–34.0)
MCHC: 30.8 g/dL (ref 30.0–36.0)
MCV: 74.9 fL — ABNORMAL LOW (ref 80.0–100.0)
Platelets: 215 10*3/uL (ref 150–400)
RBC: 5.33 MIL/uL — ABNORMAL HIGH (ref 3.87–5.11)
RDW: 14.6 % (ref 11.5–15.5)
WBC: 4.5 10*3/uL (ref 4.0–10.5)
nRBC: 0 % (ref 0.0–0.2)

## 2023-07-10 MED ORDER — LISINOPRIL 10 MG PO TABS: 10.0000 mg | ORAL_TABLET | Freq: Every day | ORAL | 0 refills | Status: DC

## 2023-07-10 MED ORDER — HYDRALAZINE HCL 20 MG/ML IJ SOLN
10.0000 mg | Freq: Once | INTRAMUSCULAR | Status: AC
  Administered 2023-07-10: 10 mg via INTRAVENOUS
  Filled 2023-07-10: qty 1

## 2023-07-10 MED ORDER — METOCLOPRAMIDE HCL 5 MG/ML IJ SOLN
10.0000 mg | Freq: Once | INTRAMUSCULAR | Status: AC
  Administered 2023-07-10: 10 mg via INTRAVENOUS
  Filled 2023-07-10: qty 2

## 2023-07-10 MED ORDER — DIPHENHYDRAMINE HCL 50 MG/ML IJ SOLN
12.5000 mg | Freq: Once | INTRAMUSCULAR | Status: AC
Start: 2023-07-10 — End: 2023-07-10
  Administered 2023-07-10: 12.5 mg via INTRAVENOUS
  Filled 2023-07-10: qty 1

## 2023-07-10 NOTE — ED Triage Notes (Signed)
Pt arrived from home via POV c/o headache and HTN. Headache 10/10 on pain scale. Pt is photophobic. Hx of HTN.

## 2023-07-10 NOTE — ED Provider Notes (Signed)
[place] EMERGENCY DEPARTMENT AT [facility] Provider Note   CSN: [id number] Arrival date & time: 07/10/23  0402     History  Chief Complaint  Patient presents with   Hypertension    Mary Sanford is a 57 y.o. female.  Pt is a 57 yo female with pmhx significant for HTN, anxiety, kidney stones, arthritis, hyperaldosteronism and anemia.  Pt said her pcp has been trying to work on her bp.  However, it is not getting better.  She was just diagnosed with hyperaldosteronism, so her pcp stopped her diazide and changed her meds to spironolactone.  She had been on phentermine, but that was stopped as well.  She is also on nebivolol 10 mg daily which was increased to this dose about a month ago.  Pt has a severe headache.  It is not associated with any weakness or numbness, but she said she does feel strange when she stands up.  She has been compliant with meds.          Home Medications Prior to Admission medications   Medication Sig Start Date End Date Taking? Authorizing Provider  lisinopril (ZESTRIL) 10 MG tablet Take 1 tablet (10 mg total) by mouth daily. 07/10/23  Yes Jacalyn Lefevre, MD  acetaminophen (TYLENOL) 500 MG tablet Take 500-1,000 mg by mouth as needed for headache or moderate pain.    [provider]  atorvastatin (LIPITOR) 10 MG tablet Take 1 tablet (10 mg total) by mouth daily. 06/17/23   Etta Grandchild, MD  escitalopram (LEXAPRO) 10 MG tablet Take 1 tablet PO once daily with escitalopram 20 mg tablet. 07/07/23   Etta Grandchild, MD  escitalopram (LEXAPRO) 20 MG tablet Take 1 tablet by mouth once daily 05/25/23   Etta Grandchild, MD  nebivolol (BYSTOLIC) 10 MG tablet Take 1 tablet (10 mg total) by mouth daily. 06/17/23   Etta Grandchild, MD  spironolactone (ALDACTONE) 25 MG tablet TAKE 1 TABLET BY MOUTH ONCE DAILY 06/25/23   Etta Grandchild, MD  tiZANidine (ZANAFLEX) 2 MG tablet Take 1-2 tablets (2-4 mg total) by mouth every 8 (eight) hours as needed  for muscle spasms. 06/19/23   Etta Grandchild, MD      Allergies    Naproxen    Review of Systems   Review of Systems  Neurological:  Positive for headaches.  All other systems reviewed and are negative.   Physical Exam Updated Vital Signs BP (!) 154/100   Pulse 80   Temp 97.6 F (36.4 C) (Tympanic)   Resp (!) 23   LMP  (LMP Unknown)   SpO2 100%  Physical Exam Vitals and nursing note reviewed.  Constitutional:      Appearance: Normal appearance.  HENT:     Head: Normocephalic and atraumatic.     Right Ear: External ear normal.     Left Ear: External ear normal.     Nose: Nose normal.     Mouth/Throat:     Mouth: Mucous membranes are moist.     Pharynx: Oropharynx is clear.  Eyes:     Extraocular Movements: Extraocular movements intact.     Conjunctiva/sclera: Conjunctivae normal.     Pupils: Pupils are equal, round, and reactive to light.  Cardiovascular:     Rate and Rhythm: Normal rate and regular rhythm.     Pulses: Normal pulses.     Heart sounds: Normal heart sounds.  Pulmonary:     Effort: Pulmonary effort is normal.  Breath sounds: Normal breath sounds.  Abdominal:     General: Abdomen is flat. Bowel sounds are normal.     Palpations: Abdomen is soft.  Musculoskeletal:        General: Normal range of motion.     Cervical back: Normal range of motion and neck supple.  Skin:    General: Skin is warm.     Capillary Refill: Capillary refill takes less than 2 seconds.  Neurological:     General: No focal deficit present.     Mental Status: She is alert and oriented to person, place, and time.  Psychiatric:        Mood and Affect: Mood normal.        Behavior: Behavior normal.     ED Results / Procedures / Treatments   Labs (all labs ordered are listed, but only abnormal results are displayed) Labs Reviewed  BASIC METABOLIC PANEL - Abnormal; Notable for the following components:      Result Value   Glucose, Bld 106 (*)    All other components  within normal limits  CBC - Abnormal; Notable for the following components:   RBC 5.33 (*)    MCV 74.9 (*)    MCH 23.1 (*)    All other components within normal limits    EKG None  Radiology CT HEAD WO CONTRAST  Result Date: 07/10/2023 CLINICAL DATA:  Headache, new onset (Age >= 51y).  Hypertension. EXAM: CT HEAD WITHOUT CONTRAST TECHNIQUE: Contiguous axial images were obtained from the base of the skull through the vertex without intravenous contrast. RADIATION DOSE REDUCTION: This exam was performed according to the departmental dose-optimization program which includes automated exposure control, adjustment of the mA and/or kV according to patient size and/or use of iterative reconstruction technique. COMPARISON:  Head CT 06/27/2022 and MRI 06/30/2022 FINDINGS: Brain: There is no evidence of an acute infarct, intracranial hemorrhage, mass, midline shift, or extra-axial fluid collection. The ventricles and sulci are normal. Vascular: No hyperdense vessel. Skull: No acute fracture or suspicious osseous lesion. Sinuses/Orbits: Visualized paranasal sinuses and mastoid air cells are clear. Unremarkable orbits. Other: None. IMPRESSION: Negative head CT. Electronically Signed   By: Sebastian Ache M.D.   On: 07/10/2023 09:50    Procedures Procedures    Medications Ordered in ED Medications  metoCLOPramide (REGLAN) injection 10 mg (10 mg Intravenous Given 07/10/23 0913)  diphenhydrAMINE (BENADRYL) injection 12.5 mg (12.5 mg Intravenous Given 07/10/23 0912)  hydrALAZINE (APRESOLINE) injection 10 mg (10 mg Intravenous Given 07/10/23 1610)    ED Course/ Medical Decision Making/ A&P                                 Medical Decision Making Amount and/or Complexity of Data Reviewed Labs: ordered. Radiology: ordered.  Risk Prescription drug management.   This patient presents to the ED for concern of htn and h/a, this involves an extensive number of treatment options, and is a complaint that  carries with it a high risk of complications and morbidity.  The differential diagnosis includes htn, ich, electrolyte abn   Co morbidities that complicate the patient evaluation  HTN, anxiety, kidney stones, arthritis, hyperaldosteronism and anemia   Additional history obtained:  Additional history obtained from epic chart review External records from outside source obtained and reviewed including husband   Lab Tests:  I Ordered, and personally interpreted labs.  The pertinent results include:  cbc nl, bmp nl  Imaging Studies ordered:  I ordered imaging studies including ct head  I independently visualized and interpreted imaging which showed Negative head CT.  I agree with the radiologist interpretation   Cardiac Monitoring:  The patient was maintained on a cardiac monitor.  I personally viewed and interpreted the cardiac monitored which showed an underlying rhythm of: nsr   Medicines ordered and prescription drug management:  I ordered medication including hydralazine  for htn and reglan, benadryl for h/a  Reevaluation of the patient after these medicines showed that the patient improved I have reviewed the patients home medicines and have made adjustments as needed   Test Considered:  ct   Critical Interventions:  Pain and bp improvement   Problem List / ED Course:  HTN:  improved with hydralazine and pain relief.  Due to hyperaldosteronism, I will add lisinopril as recommended by UTD.  I did tell her of the possible complication of angioedema.  She knows to look out for tongue/lip swelling.  Pt has close f/u with pcp to monitor potassium and renal fct HA:  improved.  Pt feels much better.  She is stable for d/c.   Reevaluation:  After the interventions noted above, I reevaluated the patient and found that they have :improved   Social Determinants of Health:  Lives at home   Dispostion:  After consideration of the diagnostic results and the  patients response to treatment, I feel that the patent would benefit from discharge with outpatient f/u.          Final Clinical Impression(s) / ED Diagnoses Final diagnoses:  Hypertension, unspecified type    Rx / DC Orders ED Discharge Orders          Ordered    lisinopril (ZESTRIL) 10 MG tablet  Daily        07/10/23 1101              Jacalyn Lefevre, MD 07/10/23 1104

## 2023-07-14 ENCOUNTER — Other Ambulatory Visit: Payer: Federal, State, Local not specified - PPO | Admitting: Pharmacist

## 2023-07-14 DIAGNOSIS — E269 Hyperaldosteronism, unspecified: Secondary | ICD-10-CM

## 2023-07-14 MED ORDER — SPIRONOLACTONE 50 MG PO TABS: 50.0000 mg | ORAL_TABLET | Freq: Every day | ORAL | 0 refills | Status: DC

## 2023-07-14 NOTE — Progress Notes (Signed)
07/14/2023  Name: Mary Sanford MRN: [id number] DOB: 12/05/1965  Chief Complaint  Patient presents with   Hypertension   Medication Management    Mary Sanford is a 57 y.o. year old female who presented for a telephone visit.   They were referred to the pharmacist by their PCP for assistance in managing hypertension.   Subjective:  Care Team: Primary Care Provider: Etta Grandchild, MD ; Next Scheduled Visit: 07/23/2023 Awaiting call to schedule Endorinology consult  Medication Access/Adherence  Current Pharmacy:  [facility] [address] ([place]), [place] - [address] [place] ([place]) [place] [address] Phone: [phone] Fax: [phone]  [facility] [address] [place] [address] Phone: [phone] Fax: [phone]   Patient reports affordability concerns with their medications: No  Patient reports access/transportation concerns to their pharmacy: No  Patient reports adherence concerns with their medications:  No     Hypertension:  Current medications: nebivolol 10 mg daily (increased 11/7), spironolactone 25 mg daily (started 11/15) Medications previously tried: Dyazide (changed to spironolactone due to new dx of hyperaldosteronism from 11/7 labs)  *Pt was taking phentermine, which she stopped last week. She asked for a Rx for this, wants to restart to help with weight loss.  **Pt went to ED 12/6 due to elevate BP and headache. BP was initial 176/118 upon presentation, went down to 157/66 after receiving hydralazine injection. ED provider prescribed lisinopril, which patient did not start taking. She states she does not want to take more medication.  Patient does not have a validated, automated, upper arm home BP cuff Current blood pressure readings using a wrist monitor: has not checked since ED because she feels it will stress her out  Patient denies hypotensive s/sx including  dizziness, lightheadedness.  Patient  reports hypertensive symptoms including headache, denies chest pain, shortness of breath  Anxiety/Depression: Current medication: escitalopram 30 mg daily - increased 12/3 Has been on this for several years. Feels her anxiety/depression is not controlled and asks if there is a higher dose  Objective:  BP Readings from Last 3 Encounters:  07/10/23 (!) 157/66  06/19/23 (!) 144/92  06/11/23 (!) 160/108     Lab Results  Component Value Date   HGBA1C 6.2 06/11/2023    Lab Results  Component Value Date   CREATININE 0.76 07/10/2023   BUN 11 07/10/2023   NA 137 07/10/2023   K 3.8 07/10/2023   CL 105 07/10/2023   CO2 25 07/10/2023    Lab Results  Component Value Date   CHOL 142 12/26/2021   HDL 61.60 12/26/2021   LDLCALC 56 12/26/2021   TRIG 122.0 12/26/2021   CHOLHDL 2 12/26/2021    Medications Reviewed Today     Reviewed by Bonita Quin, RPH (Pharmacist) on 07/14/23 at 1426  Med List Status: <None>   Medication Order Taking? Sig Documenting Provider Last Dose Status Informant  acetaminophen (TYLENOL) 500 MG tablet [id number]  Take 500-1,000 mg by mouth as needed for headache or moderate pain. [provider]  Active Self, Pharmacy Records, Spouse/Significant Other, Family Member  atorvastatin (LIPITOR) 10 MG tablet [id number] Yes Take 1 tablet (10 mg total) by mouth daily. Etta Grandchild, MD Taking Active   escitalopram (LEXAPRO) 10 MG tablet [id number]  Take 1 tablet PO once daily with escitalopram 20 mg tablet. Etta Grandchild, MD  Active   escitalopram (LEXAPRO) 20 MG tablet [id number]  Take 1 tablet by mouth  once daily Etta Grandchild, MD  Active   lisinopril (ZESTRIL) 10 MG tablet [id number] No Take 1 tablet (10 mg total) by mouth daily.  Patient not taking: Reported on 07/14/2023   Jacalyn Lefevre, MD Not Taking Active   nebivolol (BYSTOLIC) 10 MG tablet [id number] Yes Take 1 tablet (10 mg total) by mouth daily. Etta Grandchild, MD Taking Active   spironolactone (ALDACTONE) 25 MG tablet [id number] Yes TAKE 1 TABLET BY MOUTH ONCE DAILY Etta Grandchild, MD Taking Active   tiZANidine (ZANAFLEX) 2 MG tablet [id number]  Take 1-2 tablets (2-4 mg total) by mouth every 8 (eight) hours as needed for muscle spasms. Etta Grandchild, MD  Active               Assessment/Plan:   Hypertension: - Currently uncontrolled, BP goal <130/80 - Reviewed long term cardiovascular and renal outcomes of uncontrolled blood pressure - Discussed dietary changes to promote weight loss - Reviewed appropriate blood pressure monitoring technique and reviewed goal blood pressure. Recommended to check home blood pressure and heart rate daily - recommend to purchase an arm monitor or get daughter to check it manually - Scr and potassium were stable and WNL during ED visit 12/6 - Recommend increasing spironolactone to 50 mg daily. Follow up with PCP on 12/19 as scheduled. - She will call endocrinologist to schedule consult    Follow Up Plan: 12/31 follow up  Arbutus Leas, PharmD, BCPS, CPP Clinical Pharmacist Practitioner [place] [facility] [phone]

## 2023-07-20 ENCOUNTER — Other Ambulatory Visit: Payer: Self-pay | Admitting: Internal Medicine

## 2023-07-20 DIAGNOSIS — F418 Other specified anxiety disorders: Secondary | ICD-10-CM

## 2023-07-23 ENCOUNTER — Ambulatory Visit: Payer: Federal, State, Local not specified - PPO | Admitting: Internal Medicine

## 2023-07-28 DIAGNOSIS — N939 Abnormal uterine and vaginal bleeding, unspecified: Secondary | ICD-10-CM

## 2023-07-31 ENCOUNTER — Telehealth: Payer: Federal, State, Local not specified - PPO

## 2023-08-04 ENCOUNTER — Other Ambulatory Visit: Payer: Federal, State, Local not specified - PPO | Admitting: Pharmacist

## 2023-08-04 ENCOUNTER — Telehealth: Payer: Self-pay | Admitting: Family Medicine

## 2023-08-04 DIAGNOSIS — I10 Essential (primary) hypertension: Secondary | ICD-10-CM

## 2023-08-04 NOTE — Patient Instructions (Signed)
 It was a pleasure speaking with you today!  Be sure to increase your spironolactone  to 50 mg daily - you can take two 25 mg tablets until you run our and then start taking 50 mg tablets, 1 tablet daily. You will need to contact your pharmacy to fill the 50 mg prescription they have on file.  Contact the office to reschedule your missed appointment with Dr. Joshua.  Feel free to call with any questions or concerns!  Darrelyn Drum, PharmD, BCPS [facility] Clinical Pharmacist [facility] [phone]

## 2023-08-04 NOTE — Telephone Encounter (Signed)
Patient called in stating she is not sure if she should go to the ED, she is experiencing bleeding, with cramping and all. She states this should not be happening.

## 2023-08-04 NOTE — Progress Notes (Signed)
 08/04/2023  Name: Mary Sanford MRN: [id number] DOB: April 24, 1966  Chief Complaint  Patient presents with   Hypertension   Medication Management    Mary Sanford is a 57 y.o. year old female who presented for a telephone visit.   They were referred to the pharmacist by their PCP for assistance in managing hypertension.   Subjective:  Care Team: Primary Care Provider: Joshua Debby CROME, MD ; Next Scheduled Visit: 07/23/2023 Awaiting call to schedule Endorinology consult  Medication Access/Adherence  Current Pharmacy:  [facility] [address] ([place]), [place] - [address] [place] ([place]) [place] [address] Phone: [phone] Fax: [phone]  [facility] [address] [place] [address] Phone: [phone] Fax: [phone]   Patient reports affordability concerns with their medications: No  Patient reports access/transportation concerns to their pharmacy: No  Patient reports adherence concerns with their medications:  No     Hypertension:  Current medications: nebivolol  10 mg daily (increased 11/7), spironolactone  25 mg daily (started 11/15 - was supposed to increase to 50 mg 12/10 however has not) Medications previously tried: Dyazide (changed to spironolactone  due to new dx of hyperaldosteronism from 11/7 labs)  -She has been having vaginal bleeding which she is very concerned about because she has already gone through menopause - she has contacted her OBGYN office for guidance  **Pt went to ED 12/6 due to elevate BP and headache. BP was initial 176/118 upon presentation, went down to 157/66 after receiving hydralazine  injection. ED provider prescribed lisinopril , which patient did not start taking. She states she does not want to take more medication.  Patient does not have a validated, automated, upper arm home BP cuff Current blood pressure readings using a wrist monitor: has not checked since  ED because she feels it will stress her out  Patient denies hypotensive s/sx including dizziness, lightheadedness.  Patient  reports hypertensive symptoms including headache, denies chest pain, shortness of breath  Anxiety/Depression: Current medication: escitalopram  30 mg daily - increased 12/3 Has been on this for several years. Feels her anxiety/depression is not controlled and asks if there is a higher dose  Objective:  BP Readings from Last 3 Encounters:  07/10/23 (!) 157/66  06/19/23 (!) 144/92  06/11/23 (!) 160/108     Lab Results  Component Value Date   HGBA1C 6.2 06/11/2023    Lab Results  Component Value Date   CREATININE 0.76 07/10/2023   BUN 11 07/10/2023   NA 137 07/10/2023   K 3.8 07/10/2023   CL 105 07/10/2023   CO2 25 07/10/2023    Lab Results  Component Value Date   CHOL 142 12/26/2021   HDL 61.60 12/26/2021   LDLCALC 56 12/26/2021   TRIG 122.0 12/26/2021   CHOLHDL 2 12/26/2021    Medications Reviewed Today   Medications were not reviewed in this encounter       Assessment/Plan:   Hypertension: - Currently uncontrolled, BP goal <130/80 - no recent BP readings - Reviewed long term cardiovascular and renal outcomes of uncontrolled blood pressure - Discussed dietary changes to promote weight loss - Reviewed appropriate blood pressure monitoring technique and reviewed goal blood pressure. Recommended to check home blood pressure and heart rate daily - recommend to purchase an arm monitor or get daughter to check it manually - Scr and potassium were stable and WNL during ED visit 12/6 - Recommend increasing spironolactone  to 50 mg daily.  - She will call  office to rescheduled missed PCP f/u - She will call endocrinologist to schedule consult    Follow Up Plan: 1/14  Darrelyn Drum, PharmD, BCPS, CPP Clinical Pharmacist Practitioner [place] [facility] [phone]

## 2023-08-07 NOTE — Telephone Encounter (Signed)
 I called Mary Sanford and left a message I am returning her call and apologized for the delay. I requested she call back Monday and leave a more detailed message on nurse line or send a MyChart message and we will follow up with her. [name]

## 2023-08-18 ENCOUNTER — Other Ambulatory Visit: Payer: Federal, State, Local not specified - PPO | Admitting: Pharmacist

## 2023-08-18 DIAGNOSIS — I10 Essential (primary) hypertension: Secondary | ICD-10-CM

## 2023-08-18 NOTE — Patient Instructions (Signed)
 It was a pleasure speaking with you today!  Make sure to take spironolactone  50 mg daily and check blood pressure regularly at home. Keep a log for us  to review in 2 weeks.  Feel free to call with any questions or concerns!  Darrelyn Drum, PharmD, BCPS, CPP Clinical Pharmacist Practitioner [place] [facility] at [facility] [phone]

## 2023-08-18 NOTE — Progress Notes (Signed)
 08/18/2023  Name: Mary Sanford MRN: [id number] DOB: [birth year]  Chief Complaint  Patient presents with   Hypertension   Medication Management    SALLYANNE BIRKHEAD is a 58 y.o. year old female who presented for a telephone visit.   They were referred to the pharmacist by their PCP for assistance in managing hypertension.   Subjective:  Care Team: Primary Care Provider: Joshua Debby CROME, MD ; Next Scheduled Visit: 07/23/2023 Awaiting call to schedule Endorinology consult  Medication Access/Adherence  Current Pharmacy:  [facility] [address] ([place]), [place] - [address] [place] ([place] [address] Phone: [phone] Fax: [phone]  [facility] [address] [place] [address] Phone: [phone] Fax: [phone]   Patient reports affordability concerns with their medications: No  Patient reports access/transportation concerns to their pharmacy: No  Patient reports adherence concerns with their medications:  No     Hypertension:  Current medications: nebivolol  10 mg daily (increased 11/7), spironolactone  25 mg daily (started 11/15 - was supposed to increase to 50 mg 12/10 however unsure if she has been taking 2 tablets Medications previously tried: Dyazide (changed to spironolactone  due to new dx of hyperaldosteronism from 11/7 labs)  -She has been having vaginal bleeding which she is very concerned about because she has already gone through menopause - she sees OBGYN tomorrow  **Pt went to ED 12/6 due to elevate BP and headache. BP was initial 176/118 upon presentation, went down to 157/66 after receiving hydralazine  injection. ED provider prescribed lisinopril , which patient did not start taking. She states she does not want to take more medication.  Patient does not have a validated, automated, upper arm home BP cuff Current blood pressure readings using a wrist monitor: has not checked    Patient denies hypotensive s/sx including dizziness, lightheadedness.  Patient  denies hypertensive symptoms including headache, denies chest pain, shortness of breath  Anxiety/Depression: Current medication: escitalopram  30 mg daily - increased 12/3 Has been on this for several years. Feels her anxiety/depression is not controlled and asks if there is a higher dose  Objective:  BP Readings from Last 3 Encounters:  07/10/23 (!) 157/66  06/19/23 (!) 144/92  06/11/23 (!) 160/108     Lab Results  Component Value Date   HGBA1C 6.2 06/11/2023    Lab Results  Component Value Date   CREATININE 0.76 07/10/2023   BUN 11 07/10/2023   NA 137 07/10/2023   K 3.8 07/10/2023   CL 105 07/10/2023   CO2 25 07/10/2023    Lab Results  Component Value Date   CHOL 142 12/26/2021   HDL 61.60 12/26/2021   LDLCALC 56 12/26/2021   TRIG 122.0 12/26/2021   CHOLHDL 2 12/26/2021    Medications Reviewed Today   Medications were not reviewed in this encounter       Assessment/Plan:   Hypertension: - Currently uncontrolled, BP goal <130/80 - no recent BP readings - Reviewed long term cardiovascular and renal outcomes of uncontrolled blood pressure - Reviewed appropriate blood pressure monitoring technique and reviewed goal blood pressure. Recommended to check home blood pressure and heart rate daily - recommend to purchase an arm monitor or get daughter to check it manually - Scr and potassium were stable and WNL during ED visit 12/6 - Recommend increasing spironolactone  to 50 mg daily  and get updated Rx from pharmacy    Follow Up Plan: 1/27  Darrelyn Drum, PharmD, BCPS, CPP  Clinical Pharmacist Practitioner [place] [facility] at [facility] [phone]

## 2023-08-18 NOTE — Progress Notes (Signed)
 GYNECOLOGY VISIT  Patient name: Mary Sanford MRN [id number]  Date of birth: 1965-11-10 Chief Complaint:   Abnormal Uterine Bleeding  History:  Mary Sanford is a 59 y.o. [id number] being seen today for PMB.   01/08/2023: seen for postmenopausal spotting 6 months prior to presentation w/ history of endometrial ablation, vaginal dryness and dyspareuania  Discussed the use of AI scribe software for clinical note transcription with the patient, who gave verbal consent to proceed.  History of Present Illness   The patient, with a history of uterine ablation, presents with a recent episode of vaginal bleeding lasting four days accompanied by cramping, which was the most significant since the ablation. This episode occurred just before Christmas, with no precipitating factors such as abnormal discharge or recent intercourse. The patient also reports persistent brown vaginal discharge and pruritus, particularly affecting the labia, but denies any associated odor. They attempted self-treatment with Monistat twice, suspecting a yeast infection, but the discharge persisted.  The patient expresses concern about the possibility of cancer, given the unexpected bleeding post-ablation. They underwent a hysteroscopy in 2021, which revealed a benign polyp. They also have a history of weight loss surgery, a C-section, a tubal ligation, and treatment for kidney stones and hemorrhoids.  The patient recently accepted a new job in a different location and is considering a hysterectomy to address the bleeding. However, they are also open to another hysteroscopy to investigate the cause of the bleeding. The patient is aware of the recovery process and restrictions following a hysterectomy. They are also aware of the potential for benign polyps to cause postmenopausal bleeding.       Past Medical History:  Diagnosis Date   Allergy    seasonal allergies   Anemia    Anxiety    Arthritis    hands, left knee    COVID    Depression    Family history of adverse reaction to anesthesia    one sister ponv and slow to awaken   History of iron deficiency anemia    History of kidney stones    Hypertension    followed by pcp  (12-05-2019 pt had ETT 12-11-2011 , in epic, showed normal with no ischemia)   Nephrolithiasis    Panic disorder    PMB (postmenopausal bleeding)    Pre-diabetes    followed by pcp   Seasonal allergies    SUI (stress urinary incontinence, female)    Uterine fibroid    Wears glasses     Past Surgical History:  Procedure Laterality Date   BUNIONECTOMY Bilateral    CESAREAN SECTION  1989   CYSTOSCOPY W/ RETROGRADES Left 06/28/2018   Procedure: CYSTOSCOPY WITH RETROGRADE PYELOGRAM LEFT STENT;  Surgeon: Adelbert Homans, MD;  Location: [facility];  Service: Urology;  Laterality: Left;   CYSTOSCOPY/URETEROSCOPY/HOLMIUM LASER/STENT PLACEMENT Left 07/16/2018   Procedure: CYSTOSCOPY/URETEROSCOPY/HOLMIUM LASER/STENT EXCHANGE;  Surgeon: Adelbert Homans, MD;  Location: [facility];  Service: Urology;  Laterality: Left;   DILATATION & CURETTAGE/HYSTEROSCOPY WITH MYOSURE N/A 12/13/2019   Procedure: DILATATION & CURETTAGE/HYSTEROSCOPY WITH MYOSURE;  Surgeon: Wanita Gutta, MD;  Location: [facility];  Service: Gynecology;  Laterality: N/A;   DILITATION & CURRETTAGE/HYSTROSCOPY WITH NOVASURE ABLATION N/A 06/17/2013   Procedure: DILATATION & CURETTAGE/HYSTEROSCOPY WITH NOVASURE ABLATION;  Surgeon: Julianne Octave, MD;  Location: [facility];  Service: Gynecology;  Laterality: N/A;   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   Procedure: LAPAROSCOPIC ROUX-EN-Y GASTRIC BYPASS WITH UPPER ENDOSCOPY;  Surgeon: Aldean Hummingbird, MD;  Location: [facility];  Service: General;  Laterality: N/A;   HEMORRHOIDECTOMY WITH HEMORRHOID BANDING  10/19/2012   SCA THD Hem ligation/pexy   OPERATIVE ULTRASOUND N/A 12/13/2019   Procedure: OPERATIVE ULTRASOUND;  Surgeon: Wanita Gutta, MD;  Location: [facility];  Service: Gynecology;  Laterality: N/A;   Thumb surgery   04/2021   TUBAL LIGATION Bilateral 2001   UPPER GASTROINTESTINAL ENDOSCOPY  10/11/2020   WISDOM TOOTH EXTRACTION      The following portions of the patient's history were reviewed and updated as appropriate: allergies, current medications, past family history, past medical history, past social history, past surgical history and problem list.   Health Maintenance:   Last pap     Component Value Date/Time   DIAGPAP  09/30/2022 1201    - Negative for intraepithelial lesion or malignancy (NILM)   DIAGPAP  07/06/2019 1552    - Negative for intraepithelial lesion or malignancy (NILM)   DIAGPAP (A) 06/30/2016 0000    ATYPICAL SQUAMOUS CELLS OF UNDETERMINED SIGNIFICANCE (ASC-US ).   HPVHIGH Negative 09/30/2022 1201   HPVHIGH Negative 07/06/2019 1552   ADEQPAP  09/30/2022 1201    Satisfactory for evaluation; transformation zone component PRESENT.   ADEQPAP  07/06/2019 1552    Satisfactory for evaluation; transformation zone component PRESENT.   ADEQPAP (A) 06/30/2016 0000    Satisfactory for evaluation  endocervical/transformation zone component PRESENT.    High Risk HPV: Positive  Adequacy:  Satisfactory for evaluation, transformation zone component PRESENT  Diagnosis:  Atypical squamous cells of undetermined significance (ASC-US )  Last mammogram: 10/2022 BIRADS 1   Review of Systems:  Pertinent items are noted in HPI. Comprehensive review of systems was otherwise negative.   Objective:  Physical Exam BP (!) 183/108   Pulse 63   Wt 198 lb 6.4 oz (90 kg)   BMI 29.30 kg/m    Physical Exam Vitals and nursing note reviewed. Exam conducted with a chaperone present.  Constitutional:      Appearance: Normal appearance.  HENT:     Head: Normocephalic and atraumatic.  Pulmonary:     Effort: Pulmonary effort is normal.     Breath sounds: Normal breath sounds.   Genitourinary:    General: Normal vulva.     Exam position: Lithotomy position.     Vagina: Normal.     Cervix: Normal.     Comments: Discharge noted at introitus Atrophic vaginal canal and urethral meatus  No allodynia present Skin:    General: Skin is warm and dry.  Neurological:     General: No focal deficit present.     Mental Status: She is alert.  Psychiatric:        Mood and Affect: Mood normal.        Behavior: Behavior normal.        Thought Content: Thought content normal.        Judgment: Judgment normal.      Labs and Imaging Pelvic ultrasound   Indications: PMP bleeding   Findings:   Uterus 6.81 x 5.08 x 3.80 cm  2 small intramural fibroids -1.21 x 1.17 cm -0.96 x 0.59 cm   Endometrium 3.47 mm, irregular with shadowing (s/p ablation). No obvious masses, avascular   Left ovary 2.32 x 1.52 x 1.13 cm   Right ovary 2.54 x 2.06 x 1.74 cm  1.37 x 1.18 cm simple cyst   No free fluid   Impression:  Normal sized, anteverted uterus 2 small  intramural fibroids (stable) Thin irregular endometrium without obvious masses Normal left ovary 1.4 cm simple cyst in the right ovary (1.7 x 1.3 cm cyst in the right ovary in 4/21)       Assessment & Plan:   Assessment and Plan    Postmenopausal Bleeding First episode of bleeding since ablation, with a history of benign polyp. Patient is understandably concerned about potential malignancy. Discussed options of hysteroscopy or hysterectomy. -Schedule hysteroscopy as soon as possible to investigate cause of bleeding. -Plan to complete under US  guidance given prior uterien ablation and difficulty with in office EMB  Vaginal Itching and Discharge Likely yeast infection based on symptoms and patient's use of Monistat. -Perform vaginal swab today to confirm diagnosis. -Diflucan  sent to pharmacy for presumed candida and will follow up vaginitis results  Potential Hysterectomy Patient considering hysterectomy due to  bleeding. Discussed risks, benefits, and recovery. Patient aware of potential need for additional treatment if malignancy found. -Plan for potential hysterectomy depending on results of hysteroscopy and patient's decision.  Hypertension Noted need to recheck blood pressure before patient leaves. -Recheck blood pressure before patient leaves.      Patient desires surgical management with US -guided operative hysteroscpoy.  The risks of surgery were discussed in detail with the patient including but not limited to: bleeding which may require transfusion or reoperation; infection which may require prolonged hospitalization or re-hospitalization and antibiotic therapy; injury to bowel, bladder, ureters and major vessels or other surrounding organs which may lead to other procedures; formation of adhesions; need for additional procedures including laparotomy or subsequent procedures secondary to intraoperative injury or abnormal pathology; thromboembolic phenomenon; incisional problems and other postoperative or anesthesia complications.  the postoperative expectations were also discussed in detail. The patient also understands the alternative treatment options which were discussed in full. All questions were answered.    Routine preventative health maintenance measures emphasized.  Kiki Pelton, MD Minimally Invasive Gynecologic Surgery [facility] for [facility], [facility]

## 2023-08-19 ENCOUNTER — Encounter: Payer: Self-pay | Admitting: Family Medicine

## 2023-08-19 ENCOUNTER — Other Ambulatory Visit (HOSPITAL_COMMUNITY)
Admission: RE | Admit: 2023-08-19 | Discharge: 2023-08-19 | Disposition: A | Discharge status: Home / Self Care | Payer: Federal, State, Local not specified - PPO | Source: Ambulatory Visit | Attending: Obstetrics and Gynecology | Admitting: Obstetrics and Gynecology

## 2023-08-19 ENCOUNTER — Ambulatory Visit: Payer: Self-pay | Admitting: Obstetrics and Gynecology

## 2023-08-19 ENCOUNTER — Encounter: Payer: Self-pay | Admitting: Obstetrics and Gynecology

## 2023-08-19 VITALS — BP 183/108 | HR 63 | Wt 198.4 lb

## 2023-08-19 DIAGNOSIS — N898 Other specified noninflammatory disorders of vagina: Secondary | ICD-10-CM

## 2023-08-19 DIAGNOSIS — N950 Postmenopausal bleeding: Secondary | ICD-10-CM

## 2023-08-19 MED ORDER — FLUCONAZOLE 150 MG PO TABS: 150.0000 mg | ORAL_TABLET | Freq: Once | ORAL | 1 refills | Status: AC

## 2023-08-20 ENCOUNTER — Other Ambulatory Visit: Payer: Self-pay | Admitting: Obstetrics and Gynecology

## 2023-08-20 ENCOUNTER — Encounter: Payer: Self-pay | Admitting: Obstetrics and Gynecology

## 2023-08-20 DIAGNOSIS — A599 Trichomoniasis, unspecified: Secondary | ICD-10-CM

## 2023-08-20 DIAGNOSIS — N950 Postmenopausal bleeding: Secondary | ICD-10-CM

## 2023-08-20 DIAGNOSIS — B9689 Other specified bacterial agents as the cause of diseases classified elsewhere: Secondary | ICD-10-CM

## 2023-08-20 LAB — CERVICOVAGINAL ANCILLARY ONLY
Bacterial Vaginitis (gardnerella): POSITIVE — AB
Candida Glabrata: NEGATIVE
Candida Vaginitis: NEGATIVE
Chlamydia: NEGATIVE
Comment: NEGATIVE
Comment: NEGATIVE
Comment: NEGATIVE
Comment: NEGATIVE
Comment: NEGATIVE
Comment: NORMAL
Neisseria Gonorrhea: NEGATIVE
Trichomonas: POSITIVE — AB

## 2023-08-20 MED ORDER — METRONIDAZOLE 500 MG PO TABS: 500.0000 mg | ORAL_TABLET | Freq: Two times a day (BID) | ORAL | 0 refills | Status: AC

## 2023-08-21 ENCOUNTER — Telehealth: Payer: Self-pay

## 2023-08-21 DIAGNOSIS — Z1231 Encounter for screening mammogram for malignant neoplasm of breast: Secondary | ICD-10-CM

## 2023-08-21 NOTE — Telephone Encounter (Addendum)
-----   Message from Lorriane Shire sent at 08/20/2023  5:55 PM EST ----- Notify patient that vaginal swab shows BV (bacterial vaginosis) and trichomonas and prescription sent to pharmacy to treat both. Partner should be treated   Left message for pt to return call for results.  Pt did read MyChart message.    Leonette Nutting

## 2023-08-31 ENCOUNTER — Other Ambulatory Visit: Payer: Federal, State, Local not specified - PPO

## 2023-09-03 ENCOUNTER — Ambulatory Visit: Payer: Self-pay | Admitting: Internal Medicine

## 2023-09-03 ENCOUNTER — Emergency Department (HOSPITAL_BASED_OUTPATIENT_CLINIC_OR_DEPARTMENT_OTHER): Payer: Federal, State, Local not specified - PPO | Admitting: Radiology

## 2023-09-03 ENCOUNTER — Other Ambulatory Visit: Payer: Self-pay

## 2023-09-03 ENCOUNTER — Other Ambulatory Visit (HOSPITAL_BASED_OUTPATIENT_CLINIC_OR_DEPARTMENT_OTHER): Payer: Self-pay

## 2023-09-03 ENCOUNTER — Encounter (HOSPITAL_BASED_OUTPATIENT_CLINIC_OR_DEPARTMENT_OTHER): Payer: Self-pay | Admitting: Emergency Medicine

## 2023-09-03 ENCOUNTER — Emergency Department (HOSPITAL_BASED_OUTPATIENT_CLINIC_OR_DEPARTMENT_OTHER)
Admission: EM | Admit: 2023-09-03 | Discharge: 2023-09-03 | Disposition: A | Discharge status: Home / Self Care | Payer: Federal, State, Local not specified - PPO | Attending: Emergency Medicine | Admitting: Emergency Medicine

## 2023-09-03 ENCOUNTER — Telehealth: Payer: Self-pay

## 2023-09-03 DIAGNOSIS — R0602 Shortness of breath: Secondary | ICD-10-CM | POA: Diagnosis not present

## 2023-09-03 DIAGNOSIS — M546 Pain in thoracic spine: Secondary | ICD-10-CM | POA: Insufficient documentation

## 2023-09-03 DIAGNOSIS — I10 Essential (primary) hypertension: Secondary | ICD-10-CM | POA: Diagnosis not present

## 2023-09-03 LAB — TROPONIN I (HIGH SENSITIVITY): Troponin I (High Sensitivity): 2 ng/L (ref <18)

## 2023-09-03 LAB — CBC
HCT: 41.4 % (ref 36.0–46.0)
Hemoglobin: 13 g/dL (ref 12.0–15.0)
MCH: 23.5 pg — ABNORMAL LOW (ref 26.0–34.0)
MCHC: 31.4 g/dL (ref 30.0–36.0)
MCV: 74.7 fL — ABNORMAL LOW (ref 80.0–100.0)
Platelets: 248 10*3/uL (ref 150–400)
RBC: 5.54 MIL/uL — ABNORMAL HIGH (ref 3.87–5.11)
RDW: 13.8 % (ref 11.5–15.5)
WBC: 5.7 10*3/uL (ref 4.0–10.5)
nRBC: 0 % (ref 0.0–0.2)

## 2023-09-03 LAB — BASIC METABOLIC PANEL
Anion gap: 9 (ref 5–15)
BUN: 11 mg/dL (ref 6–20)
CO2: 23 mmol/L (ref 22–32)
Calcium: 9.1 mg/dL (ref 8.9–10.3)
Chloride: 110 mmol/L (ref 98–111)
Creatinine, Ser: 0.69 mg/dL (ref 0.44–1.00)
GFR, Estimated: >60 mL/min (ref >60)
Glucose, Bld: 190 mg/dL — ABNORMAL HIGH (ref 70–99)
Potassium: 3.7 mmol/L (ref 3.5–5.1)
Sodium: 142 mmol/L (ref 135–145)

## 2023-09-03 MED ORDER — KETOROLAC TROMETHAMINE 15 MG/ML IJ SOLN
15.0000 mg | Freq: Once | INTRAMUSCULAR | Status: AC
  Administered 2023-09-03: 15 mg via INTRAMUSCULAR
  Filled 2023-09-03: qty 1

## 2023-09-03 MED ORDER — LIDOCAINE 5 % EX PTCH
1.0000 | MEDICATED_PATCH | CUTANEOUS | 0 refills | Status: DC
  Filled 2023-09-03: qty 14, 14d supply, fill #0

## 2023-09-03 NOTE — Discharge Instructions (Addendum)
Please read and follow all provided instructions.  Your diagnoses today include:  1. Acute midline thoracic back pain     Tests performed today include: Vital signs - see below for your results today Complete blood cell count:  Basic metabolic panel: blood sugar was 190 Cardiac enzymes (blood test looking for stress on the heart): was normal EKG Chest x-ray: no signs of infection or other problem  Medications prescribed:  Lidoderm patch  Take any prescribed medications only as directed.  Home care instructions:  Follow any educational materials contained in this packet Please rest, use ice or heat on your back for the next several days Do not lift, push, pull anything more than 10 pounds for the next week  Follow-up instructions: Please follow-up with your primary care provider in the next 1 week for further evaluation of your symptoms.   Return instructions:  SEEK IMMEDIATE MEDICAL ATTENTION IF YOU HAVE: New numbness, tingling, weakness, or problem with the use of your arms or legs Severe back pain not relieved with medications Loss control of your bowels or bladder Increasing pain in any areas of the body (such as chest or abdominal pain) Shortness of breath, dizziness, or fainting.  Worsening nausea (feeling sick to your stomach), vomiting, fever, or sweats Any other emergent concerns regarding your health   Additional Information:  Your vital signs today were: BP (!) 177/94   Pulse 66   Temp 98 F (36.7 C)   Resp 14   LMP  (LMP Unknown)   SpO2 97%  If your blood pressure (BP) was elevated above 135/85 this visit, please have this repeated by your doctor within one month. --------------

## 2023-09-03 NOTE — ED Notes (Signed)
Patient transported to X-ray

## 2023-09-03 NOTE — ED Provider Notes (Signed)
[place] EMERGENCY DEPARTMENT AT [facility] Provider Note   CSN: [id number] Arrival date & time: 09/03/23  1149     History  Chief Complaint  Patient presents with   Hypertension    Mary Sanford is a 58 y.o. female.  Patient presents to the emergency department today for evaluation of mid-thoracic back pain and elevated blood pressure.  Patient called PCP today about her mid back hurting.  This started about 4 days ago.  She denies acute injuries but does lift her grandbaby regularly.  She describes the pain in her mid upper back.  It is dull and midline.  It hurts more when she moves around.  She tried heat without much improvement.  She also notes that her blood pressures have been elevated recently.  Her doctor is aware of this and has changed her blood pressure medication recently.  PCP recommended that she come to the emergency department for evaluation.  Patient denies associated strokelike symptoms.  No weakness, numbness, or tingling in the arms of the legs.  She has taken the home tizanidine and Advil/acetaminophen at bedtime.  She states that this has not helped much.        Home Medications Prior to Admission medications   Medication Sig Start Date End Date Taking? Authorizing Provider  acetaminophen (TYLENOL) 500 MG tablet Take 500-1,000 mg by mouth as needed for headache or moderate pain.    [provider]  atorvastatin (LIPITOR) 10 MG tablet Take 1 tablet (10 mg total) by mouth daily. 06/17/23   Etta Grandchild, MD  escitalopram (LEXAPRO) 10 MG tablet Take 1 tablet PO once daily with escitalopram 20 mg tablet. Patient not taking: Reported on 08/19/2023 07/07/23   Etta Grandchild, MD  escitalopram (LEXAPRO) 20 MG tablet Take 1 tablet by mouth once daily 07/20/23   Etta Grandchild, MD  lisinopril (ZESTRIL) 10 MG tablet Take 1 tablet (10 mg total) by mouth daily. Patient not taking: Reported on 07/14/2023 07/10/23   Jacalyn Lefevre, MD  nebivolol  (BYSTOLIC) 10 MG tablet Take 1 tablet (10 mg total) by mouth daily. 06/17/23   Etta Grandchild, MD  spironolactone (ALDACTONE) 50 MG tablet Take 1 tablet (50 mg total) by mouth daily. 07/14/23   Etta Grandchild, MD  tiZANidine (ZANAFLEX) 2 MG tablet Take 1-2 tablets (2-4 mg total) by mouth every 8 (eight) hours as needed for muscle spasms. 06/19/23   Etta Grandchild, MD      Allergies    Naproxen    Review of Systems   Review of Systems  Physical Exam Updated Vital Signs BP (!) 166/86   Pulse 65   Temp 98 F (36.7 C)   Resp 15   LMP  (LMP Unknown)   SpO2 96%   Physical Exam Vitals and nursing note reviewed.  Constitutional:      Appearance: She is well-developed.  HENT:     Head: Normocephalic and atraumatic.     Mouth/Throat:     Mouth: Mucous membranes are moist.  Eyes:     Conjunctiva/sclera: Conjunctivae normal.  Cardiovascular:     Rate and Rhythm: Normal rate.     Pulses:          Radial pulses are 2+ on the right side and 2+ on the left side.  Pulmonary:     Effort: Pulmonary effort is normal.  Abdominal:     Palpations: Abdomen is soft.     Tenderness: There is no abdominal tenderness.  There is no guarding or rebound.  Musculoskeletal:        General: Normal range of motion.     Cervical back: Normal range of motion and neck supple. No spasms or tenderness. Normal range of motion.     Thoracic back: Tenderness present. No swelling, spasms or bony tenderness.     Lumbar back: No tenderness or bony tenderness.     Comments: No step-off noted with palpation of spine.   Skin:    General: Skin is warm and dry.     Findings: No rash.  Neurological:     General: No focal deficit present.     Mental Status: She is alert.     Sensory: No sensory deficit.     Motor: No weakness.     Comments: 5/5 strength in entire upper and lower extremities bilaterally. No sensation deficit.      ED Results / Procedures / Treatments   Labs (all labs ordered are listed, but  only abnormal results are displayed) Labs Reviewed  BASIC METABOLIC PANEL - Abnormal; Notable for the following components:      Result Value   Glucose, Bld 190 (*)    All other components within normal limits  CBC - Abnormal; Notable for the following components:   RBC 5.54 (*)    MCV 74.7 (*)    MCH 23.5 (*)    All other components within normal limits  TROPONIN I (HIGH SENSITIVITY)  TROPONIN I (HIGH SENSITIVITY)    EKG EKG Interpretation Date/Time:  Thursday September 03 2023 12:11:12 EST Ventricular Rate:  78 PR Interval:  140 QRS Duration:  94 QT Interval:  382 QTC Calculation: 435 R Axis:   38  Text Interpretation: Normal sinus rhythm When compared with ECG of 10-Jul-2023 05:04, Anterior infarct is now Present Confirmed by Alvester Chou [phone]) on 09/03/2023 1:05:29 PM  Radiology DG Chest 2 View Result Date: 09/03/2023 CLINICAL DATA:  Shortness of breath. EXAM: CHEST - 2 VIEW COMPARISON:  June 27, 2022. FINDINGS: The heart size and mediastinal contours are within normal limits. Both lungs are clear. The visualized skeletal structures are unremarkable. IMPRESSION: No active cardiopulmonary disease. Electronically Signed   By: Lupita Raider M.D.   On: 09/03/2023 14:44    Procedures Procedures    Medications Ordered in ED Medications - No data to display  ED Course/ Medical Decision Making/ A&P    Patient seen and examined. History obtained directly from patient. Work-up including labs, imaging, EKG ordered in triage, if performed, were reviewed.    Labs/EKG: Independently reviewed and interpreted.  This included: CBC unremarkable; BMP glucose 190 otherwise unremarkable; troponin normal at 2.  EKG personally reviewed and interpreted as above, nonischemic, wandering baseline.  Imaging: Chest x-ray report pending.  Medications/Fluids: IM Toradol  Initial impression: Back pain, hypertension, awaiting chest x-ray.  3:40 PM Reassessment performed. Patient  appears stable, comfortable.  Imaging personally visualized and interpreted including: Chest x-ray, agree negative.  Reviewed pertinent lab work and imaging with patient at bedside. Questions answered.   Most current vital signs reviewed and are as follows: BP (!) 175/97   Pulse 71   Temp 98 F (36.7 C)   Resp 16   LMP  (LMP Unknown)   SpO2 98%   Plan: Discharge to home.   Prescriptions written for: Lidoderm patch  Other home care instructions discussed: Rest, ice/heat, continue home muscle relaxer and anti-inflammatories.  Avoid heavy lifting, pushing pulling.  ED return instructions discussed: New or  worsening symptoms  Follow-up instructions discussed: Patient encouraged to follow-up with their PCP in 5 days if not improving.                                 Medical Decision Making Amount and/or Complexity of Data Reviewed Labs: ordered. Radiology: ordered.  Risk Prescription drug management.   Back pain: Patient with mid-back pain.  It is reproducible in nature.  No neurological deficits. Patient is ambulatory. No warning symptoms of back pain including: fecal incontinence, urinary retention or overflow incontinence, night sweats, waking from sleep with back pain, unexplained fevers or weight loss, h/o cancer, IVDU, recent trauma. No concern for cauda equina, epidural abscess, or other serious cause of back pain. Conservative measures such as rest, ice/heat and pain medicine indicated with PCP follow-up if no improvement with conservative management.   HTN: Elevated blood pressure, normal kidney function.  This is something that is being addressed by PCP.  No signs of endorgan damage.  I have low concern for aortic dissection based on duration of symptoms and appearance.  Reassuring exam.         Final Clinical Impression(s) / ED Diagnoses Final diagnoses:  Acute midline thoracic back pain    Rx / DC Orders ED Discharge Orders          Ordered    lidocaine  (LIDODERM) 5 %  Every 24 hours        09/03/23 1511              Renne Crigler, PA-C 09/03/23 1543    Terald Sleeper, MD 09/03/23 [phone]

## 2023-09-03 NOTE — Telephone Encounter (Signed)
Reached out to patient to see if she was available for surgery w/ Dr. Fraga Deutscher on 10/06/23 @[facility] at 3 pm w/ the arrival time of 1 pm? Patient is available and would like to proceed with scheduling. Pre op details were provided over the phone. Patient would like her written details sent to her Mychart acct.

## 2023-09-03 NOTE — Telephone Encounter (Signed)
  Chief Complaint: hypertension Symptoms: headache  Frequency: comes and goes  Disposition: [x] ED /[] Urgent Care (no appt availability in office) / [] Appointment(In office/virtual)/ []  [place] [facility]/ [] Home Care/ [] Refused Recommended Disposition /[]  Mobile Bus/ []  Follow-up with PCP Additional Notes: Pt complaining of high blood pressure reading of 200/125. Pt states she has a "little headache with severe back pain." Pt states providers have been working with the pt on medication adjustment to lower bp, but so far nothing is working. Pt states she has high reading too often. Pt states she hasn't missed any bp medication doses. Per protocol, pt needs to go to ED. Pt stated she is calling her husband and heading there now.                 Copied from CRM [id number]. Topic: Clinical - Red Word Triage >> Sep 03, 2023  9:28 AM Kathryne Eriksson wrote: Red Word that prompted transfer to Nurse Triage: Elevated Blood Pressure 200/125 >> Sep 03, 2023  9:29 AM Kathryne Eriksson wrote: Patient called in stating she is having tremendous back pain as well as elevated blood pressure 120/125.  Reason for Disposition  [1] Systolic BP  >= 160 OR Diastolic >= 100 AND [2] cardiac (e.g., breathing difficulty, chest pain) or neurologic symptoms (e.g., new-onset blurred or double vision, unsteady gait)  Answer Assessment - Initial Assessment Questions 1. BLOOD PRESSURE: "What is the blood pressure?" "Did you take at least two measurements 5 minutes apart?"     200/125 2. ONSET: "When did you take your blood pressure?"     2.5 hours ago 3. HOW: "How did you take your blood pressure?" (e.g., automatic home BP monitor, visiting nurse)     Automatic  4. HISTORY: "Do you have a history of high blood pressure?"     Yes.  5. MEDICINES: "Are you taking any medicines for blood pressure?" "Have you missed any doses recently?"     Yes,  Lisinopril, bystolic 6. OTHER SYMPTOMS: "Do you have any  symptoms?" (e.g., blurred vision, chest pain, difficulty breathing, headache, weakness)     Little headache  Protocols used: Blood Pressure - High-A-AH

## 2023-09-03 NOTE — ED Triage Notes (Signed)
Pt reports htn and thoracic back pain x 4 days. Pt denies CP or shob. Referred by pcp when pt called to make appt

## 2023-09-03 NOTE — ED Notes (Signed)

## 2023-09-08 ENCOUNTER — Other Ambulatory Visit: Payer: Self-pay | Admitting: Internal Medicine

## 2023-09-08 ENCOUNTER — Encounter: Payer: Self-pay | Admitting: Internal Medicine

## 2023-09-08 ENCOUNTER — Ambulatory Visit: Payer: Federal, State, Local not specified - PPO | Admitting: Internal Medicine

## 2023-09-08 VITALS — BP 160/104 | HR 86 | Temp 98.0°F | Resp 16 | Ht 69.0 in | Wt 196.0 lb

## 2023-09-08 DIAGNOSIS — E785 Hyperlipidemia, unspecified: Secondary | ICD-10-CM

## 2023-09-08 DIAGNOSIS — D510 Vitamin B12 deficiency anemia due to intrinsic factor deficiency: Secondary | ICD-10-CM | POA: Diagnosis not present

## 2023-09-08 DIAGNOSIS — E269 Hyperaldosteronism, unspecified: Secondary | ICD-10-CM

## 2023-09-08 DIAGNOSIS — I10 Essential (primary) hypertension: Secondary | ICD-10-CM | POA: Diagnosis not present

## 2023-09-08 LAB — HEPATIC FUNCTION PANEL
ALT: 25 U/L (ref 0–35)
AST: 20 U/L (ref 0–37)
Albumin: 4 g/dL (ref 3.5–5.2)
Alkaline Phosphatase: 64 U/L (ref 39–117)
Bilirubin, Direct: 0 mg/dL (ref 0.0–0.3)
Total Bilirubin: 0.2 mg/dL (ref 0.2–1.2)
Total Protein: 6.7 g/dL (ref 6.0–8.3)

## 2023-09-08 LAB — FOLATE: Folate: 11.2 ng/mL (ref >5.9)

## 2023-09-08 MED ORDER — AMLODIPINE BESYLATE 5 MG PO TABS: 5.0000 mg | ORAL_TABLET | Freq: Every day | ORAL | 0 refills | Status: DC

## 2023-09-08 MED ORDER — SPIRONOLACTONE 100 MG PO TABS: 100.0000 mg | ORAL_TABLET | Freq: Every day | ORAL | 0 refills | Status: DC

## 2023-09-08 NOTE — Patient Instructions (Signed)
 Hypertension, Adult High blood pressure (hypertension) is when the force of blood pumping through the arteries is too strong. The arteries are the blood vessels that carry blood from the heart throughout the body. Hypertension forces the heart to work harder to pump blood and may cause arteries to become narrow or stiff. Untreated or uncontrolled hypertension can lead to a heart attack, heart failure, a stroke, kidney disease, and other problems. A blood pressure reading consists of a higher number over a lower number. Ideally, your blood pressure should be below 120/80. The first ("top") number is called the systolic pressure. It is a measure of the pressure in your arteries as your heart beats. The second ("bottom") number is called the diastolic pressure. It is a measure of the pressure in your arteries as the heart relaxes. What are the causes? The exact cause of this condition is not known. There are some conditions that result in high blood pressure. What increases the risk? Certain factors may make you more likely to develop high blood pressure. Some of these risk factors are under your control, including: Smoking. Not getting enough exercise or physical activity. Being overweight. Having too much fat, sugar, calories, or salt (sodium) in your diet. Drinking too much alcohol. Other risk factors include: Having a personal history of heart disease, diabetes, high cholesterol, or kidney disease. Stress. Having a family history of high blood pressure and high cholesterol. Having obstructive sleep apnea. Age. The risk increases with age. What are the signs or symptoms? High blood pressure may not cause symptoms. Very high blood pressure (hypertensive crisis) may cause: Headache. Fast or irregular heartbeats (palpitations). Shortness of breath. Nosebleed. Nausea and vomiting. Vision changes. Severe chest pain, dizziness, and seizures. How is this diagnosed? This condition is diagnosed by  measuring your blood pressure while you are seated, with your arm resting on a flat surface, your legs uncrossed, and your feet flat on the floor. The cuff of the blood pressure monitor will be placed directly against the skin of your upper arm at the level of your heart. Blood pressure should be measured at least twice using the same arm. Certain conditions can cause a difference in blood pressure between your right and left arms. If you have a high blood pressure reading during one visit or you have normal blood pressure with other risk factors, you may be asked to: Return on a different day to have your blood pressure checked again. Monitor your blood pressure at home for 1 week or longer. If you are diagnosed with hypertension, you may have other blood or imaging tests to help your health care provider understand your overall risk for other conditions. How is this treated? This condition is treated by making healthy lifestyle changes, such as eating healthy foods, exercising more, and reducing your alcohol intake. You may be referred for counseling on a healthy diet and physical activity. Your health care provider may prescribe medicine if lifestyle changes are not enough to get your blood pressure under control and if: Your systolic blood pressure is above 130. Your diastolic blood pressure is above 80. Your personal target blood pressure may vary depending on your medical conditions, your age, and other factors. Follow these instructions at home: Eating and drinking  Eat a diet that is high in fiber and potassium, and low in sodium, added sugar, and fat. An example of this eating plan is called the DASH diet. DASH stands for Dietary Approaches to Stop Hypertension. To eat this way: Eat  plenty of fresh fruits and vegetables. Try to fill one half of your plate at each meal with fruits and vegetables. Eat whole grains, such as whole-wheat pasta, brown rice, or whole-grain bread. Fill about one  fourth of your plate with whole grains. Eat or drink low-fat dairy products, such as skim milk or low-fat yogurt. Avoid fatty cuts of meat, processed or cured meats, and poultry with skin. Fill about one fourth of your plate with lean proteins, such as fish, chicken without skin, beans, eggs, or tofu. Avoid pre-made and processed foods. These tend to be higher in sodium, added sugar, and fat. Reduce your daily sodium intake. Many people with hypertension should eat less than 1,500 mg of sodium a day. Do not drink alcohol if: Your health care provider tells you not to drink. You are pregnant, may be pregnant, or are planning to become pregnant. If you drink alcohol: Limit how much you have to: 0-1 drink a day for women. 0-2 drinks a day for men. Know how much alcohol is in your drink. In the [place] one drink equals one 12 oz bottle of beer (355 mL), one 5 oz glass of wine (148 mL), or one 1 oz glass of hard liquor (44 mL). Lifestyle  Work with your health care provider to maintain a healthy body weight or to lose weight. Ask what an ideal weight is for you. Get at least 30 minutes of exercise that causes your heart to beat faster (aerobic exercise) most days of the week. Activities may include walking, swimming, or biking. Include exercise to strengthen your muscles (resistance exercise), such as Pilates or lifting weights, as part of your weekly exercise routine. Try to do these types of exercises for 30 minutes at least 3 days a week. Do not use any products that contain nicotine or tobacco. These products include cigarettes, chewing tobacco, and vaping devices, such as e-cigarettes. If you need help quitting, ask your health care provider. Monitor your blood pressure at home as told by your health care provider. Keep all follow-up visits. This is important. Medicines Take over-the-counter and prescription medicines only as told by your health care provider. Follow directions carefully. Blood  pressure medicines must be taken as prescribed. Do not skip doses of blood pressure medicine. Doing this puts you at risk for problems and can make the medicine less effective. Ask your health care provider about side effects or reactions to medicines that you should watch for. Contact a health care provider if you: Think you are having a reaction to a medicine you are taking. Have headaches that keep coming back (recurring). Feel dizzy. Have swelling in your ankles. Have trouble with your vision. Get help right away if you: Develop a severe headache or confusion. Have unusual weakness or numbness. Feel faint. Have severe pain in your chest or abdomen. Vomit repeatedly. Have trouble breathing. These symptoms may be an emergency. Get help right away. Call 911. Do not wait to see if the symptoms will go away. Do not drive yourself to the hospital. Summary Hypertension is when the force of blood pumping through your arteries is too strong. If this condition is not controlled, it may put you at risk for serious complications. Your personal target blood pressure may vary depending on your medical conditions, your age, and other factors. For most people, a normal blood pressure is less than 120/80. Hypertension is treated with lifestyle changes, medicines, or a combination of both. Lifestyle changes include losing weight, eating a healthy,  low-sodium diet, exercising more, and limiting alcohol. This information is not intended to replace advice given to you by your health care provider. Make sure you discuss any questions you have with your health care provider. Document Revised: 05/28/2021 Document Reviewed: 05/28/2021 Elsevier Patient Education  2024 [employer].

## 2023-09-08 NOTE — Progress Notes (Signed)
 Subjective:  Patient ID: Mary Sanford, female    DOB: 1965-09-07  Age: 58 y.o. MRN: [id number]  CC: Hypertension   HPI Mary Sanford presents for f/up ----  Discussed the use of AI scribe software for clinical note transcription with the patient, who gave verbal consent to proceed.  History of Present Illness   Mary Sanford is a 58 year old female with hypertension who presents with concerns about blood pressure management.   She is experiencing difficulties with blood pressure management and is currently taking nebivolol  and spironolactone . She recently visited the emergency room due to concerns about her blood pressure but received only a pain shot without further intervention. She has been experiencing headaches and BV, which she associates with her blood pressure. She denies chest pain or shortness of breath. She mentions that her glasses are old, which may affect her vision.  She reports cramping in her back and lower abdomen, similar to menstrual cramps, but denies widespread muscle cramping or muscle aches. She initially attributed these symptoms to her gynecological history and is seeing a gynecologist for further evaluation.  She has a history of kidney stones but is unsure if she has had imaging of the arteries leading to her kidneys. Previous tests, including a complete blood count, were normal.  She is considering increasing her physical activity by walking more.       Outpatient Medications Prior to Visit  Medication Sig Dispense Refill   acetaminophen  (TYLENOL ) 500 MG tablet Take 500-1,000 mg by mouth as needed for headache or moderate pain.     atorvastatin  (LIPITOR) 10 MG tablet Take 1 tablet (10 mg total) by mouth daily. 90 tablet 1   escitalopram  (LEXAPRO ) 20 MG tablet Take 1 tablet by mouth once daily 90 tablet 0   lidocaine  (LIDODERM ) 5 % Place 1 patch onto the skin daily. Remove & Discard patch within 12 hours or as directed by MD 14 patch 0   nebivolol   (BYSTOLIC ) 10 MG tablet Take 1 tablet (10 mg total) by mouth daily. 90 tablet 0   tiZANidine  (ZANAFLEX ) 2 MG tablet Take 1-2 tablets (2-4 mg total) by mouth every 8 (eight) hours as needed for muscle spasms. 60 tablet 2   spironolactone  (ALDACTONE ) 50 MG tablet Take 1 tablet (50 mg total) by mouth daily. 90 tablet 0   No facility-administered medications prior to visit.    ROS Review of Systems  Constitutional:  Negative for chills, diaphoresis, fatigue and fever.  HENT: Negative.    Eyes:  Positive for visual disturbance (BV).  Respiratory: Negative.  Negative for cough, chest tightness, shortness of breath and wheezing.   Cardiovascular:  Negative for chest pain, palpitations and leg swelling.  Gastrointestinal:  Negative for abdominal pain, constipation, diarrhea, nausea and vomiting.  Genitourinary:  Negative for difficulty urinating and dysuria.  Musculoskeletal: Negative.  Negative for arthralgias and back pain.  Skin:  Negative for color change and pallor.  Neurological:  Positive for headaches. Negative for dizziness and weakness.  Hematological:  Negative for adenopathy. Does not bruise/bleed easily.    Objective:  BP (!) 160/104 (BP Location: Left Arm, Patient Position: Sitting)   Pulse 86   Temp 98 F (36.7 C) (Oral)   Resp 16   Ht 5' 9 (1.753 m)   Wt 196 lb (88.9 kg)   LMP  (LMP Unknown)   SpO2 98%   BMI 28.94 kg/m   BP Readings from Last 3 Encounters:  09/08/23 (!) 160/104  09/03/23 (!) 175/97  08/19/23 (!) 183/108    Wt Readings from Last 3 Encounters:  09/08/23 196 lb (88.9 kg)  08/19/23 198 lb 6.4 oz (90 kg)  06/19/23 191 lb (86.6 kg)    Physical Exam Vitals reviewed.  Constitutional:      Appearance: Normal appearance.  HENT:     Mouth/Throat:     Mouth: Mucous membranes are moist.  Eyes:     General: No scleral icterus.    Conjunctiva/sclera: Conjunctivae normal.  Cardiovascular:     Rate and Rhythm: Normal rate and regular rhythm.      Heart sounds: No murmur heard. Pulmonary:     Effort: Pulmonary effort is normal.     Breath sounds: No stridor. No wheezing, rhonchi or rales.  Abdominal:     General: Abdomen is flat.     Palpations: There is no mass.     Tenderness: There is no abdominal tenderness. There is no guarding.     Hernia: No hernia is present.  Musculoskeletal:        General: Normal range of motion.     Cervical back: Neck supple.     Right lower leg: No edema.     Left lower leg: No edema.  Lymphadenopathy:     Cervical: No cervical adenopathy.  Skin:    General: Skin is warm and dry.  Neurological:     General: No focal deficit present.     Mental Status: She is alert and oriented to person, place, and time.  Psychiatric:        Mood and Affect: Mood normal.     Lab Results  Component Value Date   WBC 5.7 09/03/2023   HGB 13.0 09/03/2023   HCT 41.4 09/03/2023   PLT 248 09/03/2023   GLUCOSE 190 (H) 09/03/2023   CHOL 142 12/26/2021   TRIG 122.0 12/26/2021   HDL 61.60 12/26/2021   LDLCALC 56 12/26/2021   ALT 25 09/08/2023   AST 20 09/08/2023   NA 142 09/03/2023   K 3.7 09/03/2023   CL 110 09/03/2023   CREATININE 0.69 09/03/2023   BUN 11 09/03/2023   CO2 23 09/03/2023   TSH 1.12 06/11/2023   INR 1.1 06/27/2022   HGBA1C 6.2 06/11/2023    DG Chest 2 View Result Date: 09/03/2023 CLINICAL DATA:  Shortness of breath. EXAM: CHEST - 2 VIEW COMPARISON:  June 27, 2022. FINDINGS: The heart size and mediastinal contours are within normal limits. Both lungs are clear. The visualized skeletal structures are unremarkable. IMPRESSION: No active cardiopulmonary disease. Electronically Signed   By: Lynwood Landy Raddle M.D.   On: 09/03/2023 14:44    Assessment & Plan:   Malignant hypertension- Her BP not at goal. Will add a CCB and increase the spiro dose. -     amLODIPine  Besylate; Take 1 tablet (5 mg total) by mouth daily.  Dispense: 90 tablet; Refill: 0 -     Spironolactone ; Take 1 tablet (100  mg total) by mouth daily.  Dispense: 90 tablet; Refill: 0 -     MR ABDOMEN W WO CONTRAST; Future  Hyperaldosteronism (HCC)- Will evaluate for adrenal adenoma/carcinoma. -     Spironolactone ; Take 1 tablet (100 mg total) by mouth daily.  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  Debby Molt, MD

## 2023-09-10 ENCOUNTER — Other Ambulatory Visit: Payer: Self-pay | Admitting: Internal Medicine

## 2023-09-10 ENCOUNTER — Other Ambulatory Visit (INDEPENDENT_AMBULATORY_CARE_PROVIDER_SITE_OTHER): Payer: Federal, State, Local not specified - PPO

## 2023-09-10 DIAGNOSIS — E785 Hyperlipidemia, unspecified: Secondary | ICD-10-CM

## 2023-09-10 LAB — LIPID PANEL
Cholesterol: 121 mg/dL (ref 0–200)
HDL: 57.7 mg/dL (ref >39.00)
LDL Cholesterol: 11 mg/dL (ref 0–99)
NonHDL: 63.21
Total CHOL/HDL Ratio: 2
Triglycerides: 263 mg/dL — ABNORMAL HIGH (ref 0.0–149.0)
VLDL: 52.6 mg/dL — ABNORMAL HIGH (ref 0.0–40.0)

## 2023-09-11 ENCOUNTER — Telehealth: Payer: Self-pay

## 2023-09-11 ENCOUNTER — Encounter: Payer: Self-pay | Admitting: Internal Medicine

## 2023-09-14 ENCOUNTER — Other Ambulatory Visit (HOSPITAL_BASED_OUTPATIENT_CLINIC_OR_DEPARTMENT_OTHER): Payer: Self-pay

## 2023-09-14 NOTE — Progress Notes (Signed)
Patient noted to have significantly elevated blood pressure with complaint of headache, mild vision changes and generally not feeling well despite taking medications this morning.  Will not proceed with EMB today and recommend patient present to ED for complete evaluation

## 2023-09-15 ENCOUNTER — Other Ambulatory Visit: Payer: Self-pay

## 2023-09-15 ENCOUNTER — Emergency Department (HOSPITAL_COMMUNITY)
Admission: EM | Admit: 2023-09-15 | Discharge: 2023-09-15 | Disposition: A | Discharge status: Home / Self Care | Payer: Federal, State, Local not specified - PPO | Attending: Emergency Medicine | Admitting: Emergency Medicine

## 2023-09-15 ENCOUNTER — Ambulatory Visit: Payer: Federal, State, Local not specified - PPO | Admitting: Obstetrics and Gynecology

## 2023-09-15 ENCOUNTER — Encounter (HOSPITAL_COMMUNITY): Payer: Self-pay

## 2023-09-15 ENCOUNTER — Encounter: Payer: Self-pay | Admitting: Obstetrics and Gynecology

## 2023-09-15 VITALS — BP 193/115 | HR 66 | Wt 198.2 lb

## 2023-09-15 DIAGNOSIS — R519 Headache, unspecified: Secondary | ICD-10-CM

## 2023-09-15 DIAGNOSIS — I10 Essential (primary) hypertension: Secondary | ICD-10-CM | POA: Diagnosis not present

## 2023-09-15 DIAGNOSIS — Z79899 Other long term (current) drug therapy: Secondary | ICD-10-CM | POA: Diagnosis not present

## 2023-09-15 DIAGNOSIS — I1A0 Resistant hypertension: Secondary | ICD-10-CM

## 2023-09-15 LAB — BASIC METABOLIC PANEL
Anion gap: 7 (ref 5–15)
BUN: 14 mg/dL (ref 6–20)
CO2: 27 mmol/L (ref 22–32)
Calcium: 9.6 mg/dL (ref 8.9–10.3)
Chloride: 107 mmol/L (ref 98–111)
Creatinine, Ser: 0.77 mg/dL (ref 0.44–1.00)
GFR, Estimated: >60 mL/min (ref >60)
Glucose, Bld: 75 mg/dL (ref 70–99)
Potassium: 4.6 mmol/L (ref 3.5–5.1)
Sodium: 141 mmol/L (ref 135–145)

## 2023-09-15 LAB — TROPONIN I (HIGH SENSITIVITY): Troponin I (High Sensitivity): <2 ng/L (ref <18)

## 2023-09-15 LAB — CBC
HCT: 42.6 % (ref 36.0–46.0)
Hemoglobin: 13.2 g/dL (ref 12.0–15.0)
MCH: 23.4 pg — ABNORMAL LOW (ref 26.0–34.0)
MCHC: 31 g/dL (ref 30.0–36.0)
MCV: 75.4 fL — ABNORMAL LOW (ref 80.0–100.0)
Platelets: 258 10*3/uL (ref 150–400)
RBC: 5.65 MIL/uL — ABNORMAL HIGH (ref 3.87–5.11)
RDW: 13.6 % (ref 11.5–15.5)
WBC: 5.7 10*3/uL (ref 4.0–10.5)
nRBC: 0 % (ref 0.0–0.2)

## 2023-09-15 MED ORDER — AMLODIPINE BESYLATE 5 MG PO TABS: 10.0000 mg | ORAL_TABLET | Freq: Every day | ORAL | 0 refills | Status: DC

## 2023-09-15 MED ORDER — METOCLOPRAMIDE HCL 5 MG/ML IJ SOLN
10.0000 mg | Freq: Once | INTRAMUSCULAR | Status: AC
  Administered 2023-09-15: 10 mg via INTRAVENOUS
  Filled 2023-09-15: qty 2

## 2023-09-15 MED ORDER — ACETAMINOPHEN 325 MG PO TABS
650.0000 mg | ORAL_TABLET | Freq: Once | ORAL | Status: AC
  Administered 2023-09-15: 650 mg via ORAL
  Filled 2023-09-15: qty 2

## 2023-09-15 MED ORDER — AMLODIPINE BESYLATE 5 MG PO TABS
5.0000 mg | ORAL_TABLET | Freq: Once | ORAL | Status: AC
  Administered 2023-09-15: 5 mg via ORAL
  Filled 2023-09-15: qty 1

## 2023-09-15 MED ORDER — KETOROLAC TROMETHAMINE 15 MG/ML IJ SOLN
15.0000 mg | Freq: Once | INTRAMUSCULAR | Status: AC
  Administered 2023-09-15: 15 mg via INTRAVENOUS
  Filled 2023-09-15: qty 1

## 2023-09-15 NOTE — Discharge Instructions (Signed)
You were seen in the emergency department for your high blood pressure and your headache.  Your workup showed no signs of stroke or damage to your kidneys or heart from your blood pressure being high.  We gave you headache medicine and extra blood pressure medicine in the ER and your blood pressure started to improve.  You should increase your amlodipine to 10 mg daily and make sure that you are taking your blood pressure medication every day as prescribed.  You can take Tylenol or Motrin as needed for headaches.  You should keep a log of your blood pressures at home and can follow-up with your primary doctor to have your blood pressure rechecked.  You should return to the emergency department if you are having significantly worsening headache, numbness or weakness on one side the body compared to the other, severe chest pain or shortness of breath or any other new or concerning symptoms.

## 2023-09-15 NOTE — ED Provider Triage Note (Signed)
Emergency Medicine Provider Triage Evaluation Note  Mary Sanford , a 58 y.o. female  was evaluated in triage.  Pt complains of elevated blood pressures.  Patient reports elevated blood pressures for the past several weeks.  She was seen by her PCP last week for it and increased her spironolactone.  She was having some blurred vision and headaches with it back then and is still today.  She was evaluated by OB/GYN for hysterectomy and was advised to come here due to her persistent symptoms.  Review of Systems  Positive: Elevated blood pressures, blurred vision, headaches Negative: Chest pain, shortness of breath  Physical Exam  BP (!) 191/118 (BP Location: Left Arm)   Pulse 77   Temp 98 F (36.7 C)   Resp 18   LMP  (LMP Unknown)   SpO2 99%  Gen:   Awake, no distress   Resp:  Normal effort  MSK:   Moves extremities without difficulty  Other:    Medical Decision Making  Medically screening exam initiated at 12:04 PM.  Appropriate orders placed.  ILEENE ALLIE was informed that the remainder of the evaluation will be completed by another provider, this initial triage assessment does not replace that evaluation, and the importance of remaining in the ED until their evaluation is complete.    Maxwell Marion, PA-C 09/15/23 1212

## 2023-09-15 NOTE — ED Triage Notes (Signed)
Pt presents with c/o hypertension. Pt has a hx of high BP and takes two different types of BP meds for this. Pt went to her MD today for a procedure that she has coming up soon and her BP was elevated. She is also experiencing blurred vision and dizziness at this time.

## 2023-09-15 NOTE — ED Provider Notes (Signed)
[facility] AT [facility] Provider Note   CSN: [id number] Arrival date & time: 09/15/23  1038     History  Chief Complaint  Patient presents with   Hypertension    Mary Sanford is a 58 y.o. female.  Patient is a 58 year old female with a past medical history of hypertension presenting to the emergency department with high blood pressure and headaches.  The patient states that her blood pressure has been running high for the last several months.  She states that she has been following with her primary doctor who recently changed her Bystolic to Norvasc which she has been taking as prescribed.  She states that despite this her blood pressure has been running high up to the 200s systolic.  She states that today she started to develop a frontal headache with some blurred vision.  She denies any double vision or spots in her vision, nausea, vomiting, numbness or weakness.  The history is provided by the patient.  Hypertension       Home Medications Prior to Admission medications   Medication Sig Start Date End Date Taking? Authorizing Provider  acetaminophen (TYLENOL) 500 MG tablet Take 500-1,000 mg by mouth as needed for headache or moderate pain.    [provider]  amLODipine (NORVASC) 5 MG tablet Take 2 tablets (10 mg total) by mouth daily. 09/15/23   Elayne Snare K, DO  atorvastatin (LIPITOR) 10 MG tablet Take 1 tablet (10 mg total) by mouth daily. 06/17/23   Etta Grandchild, MD  escitalopram (LEXAPRO) 20 MG tablet Take 1 tablet by mouth once daily 07/20/23   Etta Grandchild, MD  lidocaine (LIDODERM) 5 % Place 1 patch onto the skin daily. Remove & Discard patch within 12 hours or as directed by MD 09/03/23   Renne Crigler, PA-C  nebivolol (BYSTOLIC) 10 MG tablet Take 1 tablet (10 mg total) by mouth daily. 06/17/23   Etta Grandchild, MD  spironolactone (ALDACTONE) 100 MG tablet Take 1 tablet (100 mg total) by mouth daily. 09/08/23    Etta Grandchild, MD  tiZANidine (ZANAFLEX) 2 MG tablet Take 1-2 tablets (2-4 mg total) by mouth every 8 (eight) hours as needed for muscle spasms. 06/19/23   Etta Grandchild, MD      Allergies    Naproxen    Review of Systems   Review of Systems  Physical Exam Updated Vital Signs BP (!) 166/104   Pulse 75   Temp 97.6 F (36.4 C) (Oral)   Resp 18   LMP  (LMP Unknown)   SpO2 98%  Physical Exam Vitals and nursing note reviewed.  Constitutional:      General: She is not in acute distress.    Appearance: Normal appearance.  HENT:     Head: Normocephalic and atraumatic.     Nose: Nose normal.     Mouth/Throat:     Mouth: Mucous membranes are moist.     Pharynx: Oropharynx is clear.  Eyes:     Extraocular Movements: Extraocular movements intact.     Conjunctiva/sclera: Conjunctivae normal.     Pupils: Pupils are equal, round, and reactive to light.  Cardiovascular:     Rate and Rhythm: Normal rate and regular rhythm.     Heart sounds: Normal heart sounds.  Pulmonary:     Effort: Pulmonary effort is normal.     Breath sounds: Normal breath sounds.  Abdominal:     General: Abdomen is flat.  Palpations: Abdomen is soft.     Tenderness: There is no abdominal tenderness.  Musculoskeletal:        General: Normal range of motion.     Cervical back: Normal range of motion and neck supple.  Skin:    General: Skin is warm and dry.  Neurological:     General: No focal deficit present.     Mental Status: She is alert and oriented to person, place, and time.     Cranial Nerves: No cranial nerve deficit.     Sensory: No sensory deficit.     Motor: No weakness.     Coordination: Coordination normal.  Psychiatric:        Mood and Affect: Mood normal.        Behavior: Behavior normal.     ED Results / Procedures / Treatments   Labs (all labs ordered are listed, but only abnormal results are displayed) Labs Reviewed  CBC - Abnormal; Notable for the following components:       Result Value   RBC 5.65 (*)    MCV 75.4 (*)    MCH 23.4 (*)    All other components within normal limits  BASIC METABOLIC PANEL  TROPONIN I (HIGH SENSITIVITY)    EKG EKG Interpretation Date/Time:  Tuesday September 15 2023 11:13:17 EST Ventricular Rate:  73 PR Interval:  140 QRS Duration:  96 QT Interval:  396 QTC Calculation: 436 R Axis:   39  Text Interpretation: Sinus rhythm with occasional Premature ventricular complexes Cannot rule out Anterior infarct , age undetermined Abnormal ECG  PVCs otherwise no significant change from last EKG Confirmed by Elayne Snare ([id number]) on 09/15/2023 1:02:29 PM  Radiology No results found.  Procedures Procedures    Medications Ordered in ED Medications  acetaminophen (TYLENOL) tablet 650 mg (650 mg Oral Given 09/15/23 1219)  amLODipine (NORVASC) tablet 5 mg (5 mg Oral Given 09/15/23 1219)  ketorolac (TORADOL) 15 MG/ML injection 15 mg (15 mg Intravenous Given 09/15/23 1343)  metoCLOPramide (REGLAN) injection 10 mg (10 mg Intravenous Given 09/15/23 1346)    ED Course/ Medical Decision Making/ A&P                                 Medical Decision Making This patient presents to the ED with chief complaint(s) of hypertension, headache with pertinent past medical history of hypertensive which further complicates the presenting complaint. The complaint involves an extensive differential diagnosis and also carries with it a high risk of complications and morbidity.    The differential diagnosis includes hypertensive urgency, no evidence of hypertensive emergency on exam, no neurologic deficits and headache was not sudden onset making ICH or mass effect unlikely, tension headache, migraine headache, no fever or meningismus making meningitis unlikely  Additional history obtained: Additional history obtained from N/A Records reviewed Primary Care Documents  ED Course and Reassessment: On patient's arrival she was hypertensive and otherwise  hemodynamically stable in no acute distress.  She was initially evaluated in triage and had EKG and labs including troponin performed.  She was given Tylenol and amlodipine.  The patient's labs are within normal range without any signs of endorgan damage.  Her blood pressure has slightly improved to the 160s.  She is continuing to have a headache.  She will be given additional Toradol and Reglan and will be closely reassessed.  Independent labs interpretation:  The following labs were independently interpreted: within normal  range  Independent visualization of imaging: -N/A  Consultation: - Consulted or discussed management/test interpretation w/ external professional: N/A  Consideration for admission or further workup: Patient has no emergent conditions requiring admission or further work-up at this time and is stable for discharge home with primary care follow-up  Social Determinants of health: N/A    Risk Prescription drug management.          Final Clinical Impression(s) / ED Diagnoses Final diagnoses:  Acute nonintractable headache, unspecified headache type  Uncontrolled hypertension    Rx / DC Orders ED Discharge Orders          Ordered    amLODipine (NORVASC) 5 MG tablet  Daily        09/15/23 1443              Rexford Maus, DO 09/15/23 1445

## 2023-09-16 ENCOUNTER — Ambulatory Visit
Admission: RE | Admit: 2023-09-16 | Discharge: 2023-09-16 | Disposition: A | Discharge status: Home / Self Care | Payer: Federal, State, Local not specified - PPO | Source: Ambulatory Visit | Attending: Internal Medicine | Admitting: Internal Medicine

## 2023-09-16 DIAGNOSIS — I10 Essential (primary) hypertension: Secondary | ICD-10-CM

## 2023-09-16 DIAGNOSIS — Z9884 Bariatric surgery status: Secondary | ICD-10-CM | POA: Diagnosis not present

## 2023-09-16 DIAGNOSIS — E269 Hyperaldosteronism, unspecified: Secondary | ICD-10-CM

## 2023-09-16 MED ORDER — GADOPICLENOL 0.5 MMOL/ML IV SOLN
9.0000 mL | Freq: Once | INTRAVENOUS | Status: AC | PRN
  Administered 2023-09-16: 9 mL via INTRAVENOUS

## 2023-09-18 ENCOUNTER — Encounter: Payer: Self-pay | Admitting: Internal Medicine

## 2023-09-21 ENCOUNTER — Other Ambulatory Visit: Payer: Self-pay | Admitting: Internal Medicine

## 2023-09-21 DIAGNOSIS — I10 Essential (primary) hypertension: Secondary | ICD-10-CM

## 2023-09-22 ENCOUNTER — Emergency Department (HOSPITAL_COMMUNITY): Payer: Federal, State, Local not specified - PPO

## 2023-09-22 ENCOUNTER — Other Ambulatory Visit: Payer: Self-pay

## 2023-09-22 ENCOUNTER — Emergency Department (HOSPITAL_COMMUNITY)
Admission: EM | Admit: 2023-09-22 | Discharge: 2023-09-22 | Disposition: A | Discharge status: Home / Self Care | Payer: Federal, State, Local not specified - PPO | Attending: Emergency Medicine | Admitting: Emergency Medicine

## 2023-09-22 DIAGNOSIS — N3000 Acute cystitis without hematuria: Secondary | ICD-10-CM | POA: Insufficient documentation

## 2023-09-22 DIAGNOSIS — Z79899 Other long term (current) drug therapy: Secondary | ICD-10-CM | POA: Diagnosis not present

## 2023-09-22 DIAGNOSIS — I160 Hypertensive urgency: Secondary | ICD-10-CM | POA: Diagnosis not present

## 2023-09-22 DIAGNOSIS — R519 Headache, unspecified: Secondary | ICD-10-CM | POA: Diagnosis not present

## 2023-09-22 LAB — BASIC METABOLIC PANEL
Anion gap: 10 (ref 5–15)
BUN: 14 mg/dL (ref 6–20)
CO2: 22 mmol/L (ref 22–32)
Calcium: 9.5 mg/dL (ref 8.9–10.3)
Chloride: 108 mmol/L (ref 98–111)
Creatinine, Ser: 0.78 mg/dL (ref 0.44–1.00)
GFR, Estimated: >60 mL/min (ref >60)
Glucose, Bld: 129 mg/dL — ABNORMAL HIGH (ref 70–99)
Potassium: 3.7 mmol/L (ref 3.5–5.1)
Sodium: 140 mmol/L (ref 135–145)

## 2023-09-22 LAB — CBC WITH DIFFERENTIAL/PLATELET
Abs Immature Granulocytes: 0.01 10*3/uL (ref 0.00–0.07)
Basophils Absolute: 0.1 10*3/uL (ref 0.0–0.1)
Basophils Relative: 2 %
Eosinophils Absolute: 0.2 10*3/uL (ref 0.0–0.5)
Eosinophils Relative: 5 %
HCT: 41 % (ref 36.0–46.0)
Hemoglobin: 13.1 g/dL (ref 12.0–15.0)
Immature Granulocytes: 0 %
Lymphocytes Relative: 42 %
Lymphs Abs: 1.8 10*3/uL (ref 0.7–4.0)
MCH: 23.5 pg — ABNORMAL LOW (ref 26.0–34.0)
MCHC: 32 g/dL (ref 30.0–36.0)
MCV: 73.6 fL — ABNORMAL LOW (ref 80.0–100.0)
Monocytes Absolute: 0.4 10*3/uL (ref 0.1–1.0)
Monocytes Relative: 10 %
Neutro Abs: 1.8 10*3/uL (ref 1.7–7.7)
Neutrophils Relative %: 41 %
Platelets: 254 10*3/uL (ref 150–400)
RBC: 5.57 MIL/uL — ABNORMAL HIGH (ref 3.87–5.11)
RDW: 13.6 % (ref 11.5–15.5)
WBC: 4.3 10*3/uL (ref 4.0–10.5)
nRBC: 0 % (ref 0.0–0.2)

## 2023-09-22 LAB — URINALYSIS, ROUTINE W REFLEX MICROSCOPIC
Bilirubin Urine: NEGATIVE
Glucose, UA: NEGATIVE mg/dL
Hgb urine dipstick: NEGATIVE
Ketones, ur: NEGATIVE mg/dL
Leukocytes,Ua: NEGATIVE
Nitrite: POSITIVE — AB
Protein, ur: NEGATIVE mg/dL
Specific Gravity, Urine: 1.021 (ref 1.005–1.030)
pH: 5 (ref 5.0–8.0)

## 2023-09-22 MED ORDER — DIPHENHYDRAMINE HCL 50 MG/ML IJ SOLN: 12.5000 mg | Freq: Once | INTRAMUSCULAR | Status: DC

## 2023-09-22 MED ORDER — CEPHALEXIN 500 MG PO CAPS
500.0000 mg | ORAL_CAPSULE | Freq: Two times a day (BID) | ORAL | 0 refills | Status: AC
Start: 2023-09-22 — End: 2023-09-29

## 2023-09-22 MED ORDER — AMLODIPINE BESYLATE 5 MG PO TABS
10.0000 mg | ORAL_TABLET | Freq: Once | ORAL | Status: AC
  Administered 2023-09-22: 10 mg via ORAL
  Filled 2023-09-22: qty 2

## 2023-09-22 MED ORDER — ACETAMINOPHEN 500 MG PO TABS
1000.0000 mg | ORAL_TABLET | Freq: Once | ORAL | Status: AC
  Administered 2023-09-22: 1000 mg via ORAL
  Filled 2023-09-22: qty 2

## 2023-09-22 MED ORDER — METOCLOPRAMIDE HCL 10 MG PO TABS
10.0000 mg | ORAL_TABLET | Freq: Once | ORAL | Status: AC
  Administered 2023-09-22: 10 mg via ORAL
  Filled 2023-09-22: qty 1

## 2023-09-22 MED ORDER — KETOROLAC TROMETHAMINE 30 MG/ML IJ SOLN: 30.0000 mg | Freq: Once | INTRAMUSCULAR | Status: DC

## 2023-09-22 MED ORDER — KETOROLAC TROMETHAMINE 30 MG/ML IJ SOLN
30.0000 mg | Freq: Once | INTRAMUSCULAR | Status: AC
  Administered 2023-09-22: 30 mg via INTRAMUSCULAR
  Filled 2023-09-22: qty 1

## 2023-09-22 MED ORDER — METOCLOPRAMIDE HCL 5 MG/ML IJ SOLN: 10.0000 mg | Freq: Once | INTRAMUSCULAR | Status: DC

## 2023-09-22 NOTE — ED Provider Notes (Signed)
 [place] EMERGENCY DEPARTMENT AT [facility] Provider Note   CSN: [id number] Arrival date & time: 09/22/23  0830     History  Chief Complaint  Patient presents with   Hypertension   Blurred Vision   Headache    Mary Sanford is a 58 y.o. female with history of B12 deficiency, s/p bariatric surgery, prediabetes, resistant hypertension, dyslipidemia, hyperaldosteronism, anxiety, depression, who presents to the ER complaining of headache, blurry vision, and elevated blood pressure. She woke up not feeling well this morning, just "feeling funny". She checked her blood pressure and it was elevated. She reports she was recently sent in some blood pressure medication from the ER, and it will be the 3rd BP medicine in her regimen. She only picked it up yesterday but has not taken it because she was worried about side effects. She denies any chest pain, shortness of breath, weakness or numbness.   She also has some urinary odor, no dysuria or hematuria.    Hypertension Associated symptoms include headaches.  Headache      Home Medications Prior to Admission medications   Medication Sig Start Date End Date Taking? Authorizing Provider  acetaminophen (TYLENOL) 500 MG tablet Take 500-1,000 mg by mouth as needed for headache or moderate pain.   Yes [provider]  atorvastatin (LIPITOR) 10 MG tablet Take 1 tablet (10 mg total) by mouth daily. 06/17/23  Yes Etta Grandchild, MD  cephALEXin (KEFLEX) 500 MG capsule Take 1 capsule (500 mg total) by mouth 2 (two) times daily for 7 days. 09/22/23 09/29/23 Yes Shervin Cypert T, PA-C  escitalopram (LEXAPRO) 20 MG tablet Take 1 tablet by mouth once daily 07/20/23  Yes Etta Grandchild, MD  Ibuprofen-Acetaminophen (ADVIL DUAL ACTION) 125-250 MG TABS Take 1 tablet by mouth every 8 (eight) hours as needed (pain).   Yes [provider]  lidocaine (LIDODERM) 5 % Place 1 patch onto the skin daily. Remove & Discard patch within  12 hours or as directed by MD Patient taking differently: Place 1 patch onto the skin daily as needed (pain). Remove & Discard patch within 12 hours or as directed by MD 09/03/23  Yes Renne Crigler, PA-C  Menthol, Topical Analgesic, (ICY HOT EX) Apply 1 Application topically daily as needed (pain).   Yes [provider]  nebivolol (BYSTOLIC) 10 MG tablet Take 1 tablet (10 mg total) by mouth daily. 06/17/23  Yes Etta Grandchild, MD  spironolactone (ALDACTONE) 100 MG tablet Take 1 tablet (100 mg total) by mouth daily. 09/08/23  Yes Etta Grandchild, MD  tiZANidine (ZANAFLEX) 2 MG tablet Take 1-2 tablets (2-4 mg total) by mouth every 8 (eight) hours as needed for muscle spasms. 06/19/23  Yes Etta Grandchild, MD  amLODipine (NORVASC) 5 MG tablet Take 2 tablets (10 mg total) by mouth daily. Patient not taking: Reported on 09/22/2023 09/15/23   Elayne Snare K, DO      Allergies    Naproxen    Review of Systems   Review of Systems  Eyes:  Positive for visual disturbance.  Neurological:  Positive for headaches.  All other systems reviewed and are negative.   Physical Exam Updated Vital Signs BP (!) 165/101   Pulse 69   Temp 98 F (36.7 C) (Oral)   Resp 17   LMP  (LMP Unknown)   SpO2 99%  Physical Exam Vitals and nursing note reviewed.  Constitutional:      Appearance: Normal appearance.  HENT:  Head: Normocephalic and atraumatic.  Eyes:     General: Lids are normal.     Extraocular Movements: Extraocular movements intact.     Conjunctiva/sclera: Conjunctivae normal.     Pupils: Pupils are equal, round, and reactive to light.  Cardiovascular:     Rate and Rhythm: Normal rate and regular rhythm.  Pulmonary:     Effort: Pulmonary effort is normal. No respiratory distress.     Breath sounds: Normal breath sounds.  Abdominal:     General: There is no distension.     Palpations: Abdomen is soft.     Tenderness: There is no abdominal tenderness.  Skin:    General: Skin  is warm and dry.  Neurological:     General: No focal deficit present.     Mental Status: She is alert.     Comments: Neuro: Speech is clear, able to follow commands. CN III-XII intact grossly intact. PERRLA. EOMI. Sensation intact throughout. Str 5/5 all extremities.     ED Results / Procedures / Treatments   Labs (all labs ordered are listed, but only abnormal results are displayed) Labs Reviewed  CBC WITH DIFFERENTIAL/PLATELET - Abnormal; Notable for the following components:      Result Value   RBC 5.57 (*)    MCV 73.6 (*)    MCH 23.5 (*)    All other components within normal limits  URINALYSIS, ROUTINE W REFLEX MICROSCOPIC - Abnormal; Notable for the following components:   APPearance HAZY (*)    Nitrite POSITIVE (*)    Bacteria, UA MANY (*)    All other components within normal limits  BASIC METABOLIC PANEL - Abnormal; Notable for the following components:   Glucose, Bld 129 (*)    All other components within normal limits    EKG None  Radiology CT Head Wo Contrast Result Date: 09/22/2023 CLINICAL DATA:  Sudden severe headache EXAM: CT HEAD WITHOUT CONTRAST TECHNIQUE: Contiguous axial images were obtained from the base of the skull through the vertex without intravenous contrast. RADIATION DOSE REDUCTION: This exam was performed according to the departmental dose-optimization program which includes automated exposure control, adjustment of the mA and/or kV according to patient size and/or use of iterative reconstruction technique. COMPARISON:  CT head 07/10/2023, MRI head 06/30/2022 FINDINGS: Brain: No acute intracranial hemorrhage. No CT evidence of acute infarct. No edema, mass effect, or midline shift. The basilar cisterns are patent. Ventricles: The ventricles are normal. Vascular: No hyperdense vessel or unexpected calcification. Skull: No acute or aggressive finding. Orbits: Orbits are symmetric. Sinuses: The visualized paranasal sinuses are clear. Other: Mastoid air  cells are clear. IMPRESSION: No CT evidence of acute intracranial abnormality. Electronically Signed   By: Emily Filbert M.D.   On: 09/22/2023 14:51    Procedures Procedures    Medications Ordered in ED Medications  ketorolac (TORADOL) 30 MG/ML injection 30 mg (has no administration in time range)  metoCLOPramide (REGLAN) tablet 10 mg (has no administration in time range)  amLODipine (NORVASC) tablet 10 mg (10 mg Oral Given 09/22/23 1148)  acetaminophen (TYLENOL) tablet 1,000 mg (1,000 mg Oral Given 09/22/23 1148)    ED Course/ Medical Decision Making/ A&P                                 Medical Decision Making Amount and/or Complexity of Data Reviewed Labs: ordered. Radiology: ordered.  Risk OTC drugs. Prescription drug management.   This patient is  a 58 y.o. female  who presents to the ED for concern of headache and blurry vision since this AM.   Differential diagnoses prior to evaluation: The emergent differential diagnosis includes, but is not limited to,  Stroke, increased ICP, meningitis, CVA, intracranial tumor, venous sinus thrombosis, migraine, cluster headache, hypertension, drug related, head injury, tension headache, sinusitis, dental abscess, otitis media, TMJ, temporal arteritis, glaucoma. This is not an exhaustive differential.   Past Medical History / Co-morbidities / Social History: B12 deficiency, s/p bariatric surgery, prediabetes, resistant hypertension, dyslipidemia, hyperaldosteronism, anxiety, depression  Additional history: Chart reviewed. Pertinent results include: reviewed ED visit records from Feb 2025 and Dec 2024, as well as PCP visit note from Nov 2024.   Physical Exam: Physical exam performed. The pertinent findings include: Hypertensive, otherwise normal vitals. Heart regular rate and rhythm, lung sounds clear. Normal neurologic exam.   Lab Tests/Imaging studies: I personally interpreted labs/imaging and the pertinent results include:  CBC and  BMP unremarkable. UA with nitrites, 11-20 WBCs, and many bacteria.  CT head unremarkable. I agree with the radiologist interpretation.  Medications: I ordered medication including tylenol, amlodipine, toradol, reglan.  I have reviewed the patients home medicines and have made adjustments as needed.   Disposition: After consideration of the diagnostic results and the patients response to treatment, I feel that emergency department workup does not suggest an emergent condition requiring admission or immediate intervention beyond what has been performed at this time. The plan is: discharge to home. Had a thorough discussion with the patient regarding her BP medication regimen and keeping blood pressure log. I have sent antibiotics for UTI. She plans to follow up with her PCP next week and I asked her to reach out to them about a repeat endocrinology referral regarding her hyperaldosteronism. The patient is safe for discharge and has been instructed to return immediately for worsening symptoms, change in symptoms or any other concerns. Pt feels comfortable with the plan and all questions were answered.   Final Clinical Impression(s) / ED Diagnoses Final diagnoses:  Hypertensive urgency  Acute cystitis without hematuria    Rx / DC Orders ED Discharge Orders          Ordered    cephALEXin (KEFLEX) 500 MG capsule  2 times daily        09/22/23 1456           Portions of this report may have been transcribed using voice recognition software. Every effort was made to ensure accuracy; however, inadvertent computerized transcription errors may be present.    Yashas Camilli T, PA-C 09/22/23 1508    Sloan Leiter, DO 09/23/23 2144

## 2023-09-22 NOTE — ED Triage Notes (Addendum)
 Pt. Stated, Im having problem with my BP being high and I just can't get it down, Ive also had some blurred vision. I do take BP medicine. Ive been to numerous places to see about it. They get it down but it comes back.

## 2023-09-22 NOTE — Discharge Instructions (Addendum)
 You were seen in the ER for headache and elevated blood pressure.   Your lab work and CT scan looked reassuring. I sent some medication to your pharmacy for a UTI. Please take your 3 prescribed blood pressure medications and keep a blood pressure log to go over with your doctor.   Please talk to your doctor about another endocrinology.   Continue to monitor how you're doing and return to the ER for new or worsening symptoms.

## 2023-09-28 NOTE — Telephone Encounter (Signed)
 Called patient to schedule surgery with Dr. Baltz Deutscher. Patient was scheduled for 11/03/23 at 12 pm. Pre-op instructions and surgery details were provided by phone. Patient request the written details be sent to her Mychart.

## 2023-10-15 ENCOUNTER — Encounter: Payer: Self-pay | Admitting: Obstetrics and Gynecology

## 2023-10-19 DIAGNOSIS — Z0289 Encounter for other administrative examinations: Secondary | ICD-10-CM

## 2023-10-23 ENCOUNTER — Encounter (HOSPITAL_COMMUNITY): Payer: Self-pay | Admitting: Obstetrics and Gynecology

## 2023-10-27 ENCOUNTER — Encounter (HOSPITAL_COMMUNITY): Payer: Self-pay | Admitting: Obstetrics and Gynecology

## 2023-10-27 NOTE — Progress Notes (Signed)
 Spoke w/ via phone for pre-op interview--- pt Lab needs dos----  no       Lab results------ lab appt 10-29-2023 @ 0900 getting CBC/ BMP/ T&S/ Vital signs COVID test -----patient states asymptomatic no test needed Arrive at ------- 1000 on 11-03-2023 NPO after MN NO Solid Food.  Clear liquids from MN until--- 0900 Pre-Surgery Ensure or G2: n/a ERAS protocol:  yes  Med rec completed Medications to take morning of surgery ----- lexapro, lipitor, bystolic, norvasc Diabetic medication ----- n/a  GLP1 agonist last dose: n/a GLP1 instructions:  Patient instructed no nail polish to be worn day of surgery Patient instructed to bring photo id and insurance card day of surgery Patient aware to have Driver (ride ) / caregiver    for 24 hours after surgery - husband, jeffrey Patient Special Instructions ----- will pick up bag w/ CHG and written instructions at lab appt Pre-Op special Instructions ----- n/a  Patient verbalized understanding of instructions that were given at this phone interview. Patient denies chest pain, sob, fever, cough at the interview.    Anesthesia Review:  HTN;  hyperaldosteronism;  s/p roux-en-y 2019; pre-diabetes  Pt ED visit for HTN urgency 09-22-2023 , bp 165/101;  sent to ED 09-15-2023 by GYN Dr Woolman Deutscher for bp 193/115;  PCP office visit 09-08-2023 bp 106/ 104;  all visit's in epic Asked pt if she taking her bp medication as prescribed , stated yes she is.  Asked pt does she have  a bp monitor at home , stated yes she does.  Asked has been checking her bp at home and when is the last time she has check it,  stated she has not check bp since last ED visit 09-22-2023. Asked if she has PCP follow visit scheduled stated yes , 11-26-2023.  Advised patient she needs to call her PCP office as soon as possible prior to her surgery because with bp being high day of surgery anesthesia could possibly cancel  her surgery day of surgery.  Pt verbalized understanding stated that she  would call her pcp.  Pt does have her PAT lab appt 03/27 will check vital signs and have anesthesia review if bp abnormal.  PCP:  Dr Shela Commons. Maisie Fus [facility] 09-08-2023)   Chest x-ray : 09-03-2023 EKG : 09-15-2023 Echo : no Stress test: no Cardiac Cath : no  Activity level: denies sob w any activity Sleep Study/ CPAP :  no Fasting Blood Sugar : / Checks Blood Sugar -- times a day:  does not check  Blood Thinner/ Instructions /Last Dose: no ASA / Instructions/ Last Dose : no

## 2023-10-27 NOTE — Pre-Procedure Instructions (Addendum)
 Surgical Instructions   Your procedure is scheduled on : Tuesday,  11-03-2023. Report to [facility] Main Entrance "A" at 10:00 A.M., then check in with the Admitting office. Any questions or running late day of surgery: call [phone]  Questions prior to your surgery date: call [phone], Monday-Friday, 8am-4pm. If you experience any cold or flu symptoms such as cough, fever, chills, shortness of breath, etc. between now and your scheduled surgery, please notify your surgeon office.    Remember:  Do not eat any food after midnight the night before your surgery.  You may have clear liquid from midnight night before surgery until 9:00 AM.   You may drink clear liquids until 9:00 AM the morning of your surgery.   Clear liquids allowed are:  Water Carbonated Beverages (low sugar, diet) Clear Tea (may use non-sweetener, no milk, honey, etc.) Black Coffee Only (may use non-sweetener, NO MILK, CREAM OR POWDERED CREAMER of any kind) Sports drink like Gatorade (low sugar, no sugar, sugar-free) NO clear liquids after 9:00 AM day of surgery.  This includes no water,  candy,  gum,  and  mints.    Take these medicines the morning of surgery with A SIPS OF WATER :   Escitalopram (lexopro) Atorvastatin (lipitor) Nebivolol (bystolic) Amlodipine (norvasc)   May take these medicines IF NEEDED: Tizanidine (zanaflex)    One week prior to surgery, STOP taking any Aspirin (unless otherwise instructed by your surgeon) Aleve, Naproxen, Ibuprofen, Motrin, Advil, Goody's, BC's, all herbal medications, fish oil, and non-prescription vitamins.                     Do NOT Smoke (Tobacco/Vaping) for 24 hours prior to your procedure. Do NOT drink alcohol for 24 hours prior to your procedure.  If you use a CPAP at night, you may bring your mask/headgear for your overnight stay.   You will be asked to remove any contacts, glasses, piercing's, hearing aid's, dentures/partials prior to surgery. Please  bring cases for these items if needed.    Patients discharged the day of surgery will not be allowed to drive home, and someone needs to stay with them for 24 hours.  SURGICAL WAITING ROOM VISITATION Patients may have no more than 2 support people in the waiting area - these visitors may rotate.   Pre-op nurse will coordinate an appropriate time for 1 ADULT support person, who may not rotate, to accompany patient in pre-op.  Children under the age of 34 must have an adult with them who is not the patient and must remain in the main waiting area with an adult.  If the patient needs to stay at the hospital during part of their recovery, the visitor guidelines for inpatient rooms apply.  Please refer to the [facility] website for the visitor guidelines for any additional information.   If you received a COVID test during your pre-op visit  it is requested that you wear a mask when out in public, stay away from anyone that may not be feeling well and notify your surgeon if you develop symptoms. If you have been in contact with anyone that has tested positive in the last 10 days please notify you surgeon.      Pre-operative CHG Bathing Instructions   You can play a key role in reducing the risk of infection after surgery. Your skin needs to be as free of germs as possible. You can reduce the number of germs on your skin by washing with  CHG (chlorhexidine gluconate) soap before surgery. CHG is an antiseptic soap that kills germs and continues to kill germs even after washing.   DO NOT use if you have an allergy to chlorhexidine/CHG or antibacterial soaps. If your skin becomes reddened or irritated, stop using the CHG and notify Pre-Op day of surgery.  If you have any skin irritation or problems with the surgical soap (CHG), do not use.  Please get a bar of dial soap or any antibacterial soap and shower following the instructions below.             TAKE A SHOWER THE NIGHT BEFORE SURGERY AND THE  DAY OF SURGERY    Please keep in mind the following:  DO NOT shave, including legs and underarms, 48 hours prior to surgery.   You may shave your face before/day of surgery.  Place clean sheets on your bed the night before surgery Use a clean washcloth (not used since being washed) for each shower. DO NOT sleep with pet's night before surgery.  CHG Shower Instructions:  Wash your face and private area with normal soap. If you choose to wash your hair, wash first with your normal shampoo.  After you use shampoo/soap, rinse your hair and body thoroughly to remove shampoo/soap residue.  Turn the water OFF and apply half the bottle of CHG soap to a CLEAN washcloth.  Apply CHG soap ONLY FROM YOUR NECK DOWN TO YOUR TOES (washing for 3-5 minutes)  DO NOT use CHG soap on face, private areas, open wounds, or sores.  Pay special attention to the area where your surgery is being performed.  If you are having back surgery, having someone wash your back for you may be helpful. Wait 2 minutes after CHG soap is applied, then you may rinse off the CHG soap.  Pat dry with a clean towel  Put on clean pajamas    Additional instructions for the day of surgery: DO NOT APPLY any lotions, oils, deodorants (may use underarm deodorant) , cologne/  perfumes or makeup Do not wear jewelry /  piercing's/  metal/ permanent jewelry must be removed prior to arrival  (this includes plastic piercing) Do not wear nail polish, gel polish, artificial nails, or any other type of covering on natural nails (fingers and toes) Do not bring valuables to the hospital. [facility] is not responsible for valuables/personal belongings. Put on clean/comfortable clothes.  Please brush your teeth.  Ask your nurse before applying any prescription medications to the skin.

## 2023-10-29 ENCOUNTER — Encounter (HOSPITAL_COMMUNITY)
Admission: RE | Admit: 2023-10-29 | Discharge: 2023-10-29 | Disposition: A | Discharge status: Home / Self Care | Source: Ambulatory Visit | Attending: Obstetrics and Gynecology | Admitting: Obstetrics and Gynecology

## 2023-10-29 DIAGNOSIS — Z01818 Encounter for other preprocedural examination: Secondary | ICD-10-CM

## 2023-10-29 DIAGNOSIS — Z01812 Encounter for preprocedural laboratory examination: Secondary | ICD-10-CM | POA: Insufficient documentation

## 2023-10-29 LAB — CBC
HCT: 40.6 % (ref 36.0–46.0)
Hemoglobin: 12.9 g/dL (ref 12.0–15.0)
MCH: 22.8 pg — ABNORMAL LOW (ref 26.0–34.0)
MCHC: 31.8 g/dL (ref 30.0–36.0)
MCV: 71.9 fL — ABNORMAL LOW (ref 80.0–100.0)
Platelets: 220 10*3/uL (ref 150–400)
RBC: 5.65 MIL/uL — ABNORMAL HIGH (ref 3.87–5.11)
RDW: 14.3 % (ref 11.5–15.5)
WBC: 3.8 10*3/uL — ABNORMAL LOW (ref 4.0–10.5)
nRBC: 0 % (ref 0.0–0.2)

## 2023-10-29 LAB — TYPE AND SCREEN
ABO/RH(D): B POS
Antibody Screen: NEGATIVE

## 2023-10-29 LAB — BASIC METABOLIC PANEL WITH GFR
Anion gap: 9 (ref 5–15)
BUN: 17 mg/dL (ref 6–20)
CO2: 25 mmol/L (ref 22–32)
Calcium: 9.5 mg/dL (ref 8.9–10.3)
Chloride: 107 mmol/L (ref 98–111)
Creatinine, Ser: 0.85 mg/dL (ref 0.44–1.00)
GFR, Estimated: >60 mL/min (ref >60)
Glucose, Bld: 92 mg/dL (ref 70–99)
Potassium: 4.3 mmol/L (ref 3.5–5.1)
Sodium: 141 mmol/L (ref 135–145)

## 2023-10-30 ENCOUNTER — Telehealth: Payer: Self-pay | Admitting: Obstetrics and Gynecology

## 2023-10-30 ENCOUNTER — Encounter: Payer: Self-pay | Admitting: Obstetrics and Gynecology

## 2023-10-30 NOTE — Telephone Encounter (Signed)
 Called patient, unable to reach. Left voicemail, will send mychart message

## 2023-11-03 ENCOUNTER — Ambulatory Visit (HOSPITAL_COMMUNITY)
Admission: RE | Admit: 2023-11-03 | Discharge: 2023-11-04 | Disposition: A | Discharge status: Home / Self Care | Payer: Federal, State, Local not specified - PPO | Attending: Obstetrics and Gynecology | Admitting: Obstetrics and Gynecology

## 2023-11-03 ENCOUNTER — Ambulatory Visit (HOSPITAL_COMMUNITY): Payer: Self-pay | Admitting: Certified Registered Nurse Anesthetist

## 2023-11-03 ENCOUNTER — Encounter (HOSPITAL_COMMUNITY)
Admission: RE | Disposition: A | Discharge status: Home / Self Care | Payer: Self-pay | Source: Home / Self Care | Attending: Obstetrics and Gynecology

## 2023-11-03 ENCOUNTER — Other Ambulatory Visit: Payer: Self-pay

## 2023-11-03 ENCOUNTER — Ambulatory Visit: Payer: Federal, State, Local not specified - PPO | Admitting: Endocrinology

## 2023-11-03 ENCOUNTER — Encounter (HOSPITAL_COMMUNITY): Payer: Self-pay | Admitting: Obstetrics and Gynecology

## 2023-11-03 ENCOUNTER — Other Ambulatory Visit (HOSPITAL_COMMUNITY): Payer: Self-pay

## 2023-11-03 DIAGNOSIS — D251 Intramural leiomyoma of uterus: Secondary | ICD-10-CM | POA: Insufficient documentation

## 2023-11-03 DIAGNOSIS — Z87891 Personal history of nicotine dependence: Secondary | ICD-10-CM | POA: Diagnosis not present

## 2023-11-03 DIAGNOSIS — E269 Hyperaldosteronism, unspecified: Secondary | ICD-10-CM | POA: Diagnosis not present

## 2023-11-03 DIAGNOSIS — N950 Postmenopausal bleeding: Secondary | ICD-10-CM | POA: Diagnosis not present

## 2023-11-03 DIAGNOSIS — I10 Essential (primary) hypertension: Secondary | ICD-10-CM | POA: Insufficient documentation

## 2023-11-03 DIAGNOSIS — N72 Inflammatory disease of cervix uteri: Secondary | ICD-10-CM | POA: Insufficient documentation

## 2023-11-03 DIAGNOSIS — K660 Peritoneal adhesions (postprocedural) (postinfection): Secondary | ICD-10-CM | POA: Insufficient documentation

## 2023-11-03 DIAGNOSIS — Z9884 Bariatric surgery status: Secondary | ICD-10-CM | POA: Insufficient documentation

## 2023-11-03 DIAGNOSIS — Z79899 Other long term (current) drug therapy: Secondary | ICD-10-CM | POA: Diagnosis not present

## 2023-11-03 DIAGNOSIS — Z8249 Family history of ischemic heart disease and other diseases of the circulatory system: Secondary | ICD-10-CM | POA: Insufficient documentation

## 2023-11-03 DIAGNOSIS — Z01818 Encounter for other preprocedural examination: Secondary | ICD-10-CM

## 2023-11-03 DIAGNOSIS — M1990 Unspecified osteoarthritis, unspecified site: Secondary | ICD-10-CM | POA: Insufficient documentation

## 2023-11-03 DIAGNOSIS — F419 Anxiety disorder, unspecified: Secondary | ICD-10-CM | POA: Diagnosis not present

## 2023-11-03 DIAGNOSIS — N319 Neuromuscular dysfunction of bladder, unspecified: Secondary | ICD-10-CM | POA: Insufficient documentation

## 2023-11-03 DIAGNOSIS — N939 Abnormal uterine and vaginal bleeding, unspecified: Secondary | ICD-10-CM | POA: Diagnosis present

## 2023-11-03 DIAGNOSIS — N8003 Adenomyosis of the uterus: Secondary | ICD-10-CM | POA: Insufficient documentation

## 2023-11-03 DIAGNOSIS — N83291 Other ovarian cyst, right side: Secondary | ICD-10-CM | POA: Diagnosis not present

## 2023-11-03 DIAGNOSIS — F32A Depression, unspecified: Secondary | ICD-10-CM | POA: Diagnosis not present

## 2023-11-03 DIAGNOSIS — F418 Other specified anxiety disorders: Secondary | ICD-10-CM | POA: Diagnosis not present

## 2023-11-03 HISTORY — DX: Presence of spectacles and contact lenses: Z97.3

## 2023-11-03 HISTORY — DX: Generalized anxiety disorder: F41.1

## 2023-11-03 HISTORY — PX: ROBOTIC ASSISTED LAPAROSCOPIC HYSTERECTOMY AND SALPINGECTOMY: SHX6379

## 2023-11-03 HISTORY — DX: Hyperlipidemia, unspecified: E78.5

## 2023-11-03 HISTORY — DX: Low back pain, unspecified: M54.50

## 2023-11-03 HISTORY — DX: Other chronic pain: G89.29

## 2023-11-03 HISTORY — PX: CYSTOSCOPY: SHX5120

## 2023-11-03 HISTORY — DX: Essential (primary) hypertension: I10

## 2023-11-03 HISTORY — DX: Hyperaldosteronism, unspecified: E26.9

## 2023-11-03 HISTORY — DX: Iron deficiency anemia, unspecified: D50.9

## 2023-11-03 SURGERY — XI ROBOTIC ASSISTED LAPAROSCOPIC HYSTERECTOMY AND SALPINGECTOMY
Anesthesia: General | Site: Pelvis

## 2023-11-03 MED ORDER — LIDOCAINE 2% (20 MG/ML) 5 ML SYRINGE
INTRAMUSCULAR | Status: DC | PRN
  Administered 2023-11-03: 80 mg via INTRAVENOUS

## 2023-11-03 MED ORDER — PHENYLEPHRINE 80 MCG/ML (10ML) SYRINGE FOR IV PUSH (FOR BLOOD PRESSURE SUPPORT)
PREFILLED_SYRINGE | INTRAVENOUS | Status: AC
  Filled 2023-11-03: qty 10

## 2023-11-03 MED ORDER — ONDANSETRON HCL 4 MG/2ML IJ SOLN: 4.0000 mg | Freq: Once | INTRAMUSCULAR | Status: DC | PRN

## 2023-11-03 MED ORDER — SIMETHICONE 80 MG PO CHEW: 80.0000 mg | CHEWABLE_TABLET | Freq: Four times a day (QID) | ORAL | Status: DC | PRN

## 2023-11-03 MED ORDER — ESCITALOPRAM OXALATE 20 MG PO TABS
20.0000 mg | ORAL_TABLET | Freq: Every day | ORAL | Status: DC
  Filled 2023-11-03: qty 1

## 2023-11-03 MED ORDER — MIDAZOLAM HCL 2 MG/2ML IJ SOLN
INTRAMUSCULAR | Status: DC | PRN
  Administered 2023-11-03: 2 mg via INTRAVENOUS

## 2023-11-03 MED ORDER — GABAPENTIN 300 MG PO CAPS
300.0000 mg | ORAL_CAPSULE | ORAL | Status: AC
  Administered 2023-11-03: 300 mg via ORAL
  Filled 2023-11-03: qty 1

## 2023-11-03 MED ORDER — SODIUM CHLORIDE 0.9 % IR SOLN
Status: DC | PRN
  Administered 2023-11-03: 1000 mL via INTRAVESICAL

## 2023-11-03 MED ORDER — AMISULPRIDE (ANTIEMETIC) 5 MG/2ML IV SOLN: 10.0000 mg | Freq: Once | INTRAVENOUS | Status: DC | PRN

## 2023-11-03 MED ORDER — POVIDONE-IODINE 10 % EX SWAB
2.0000 | Freq: Once | CUTANEOUS | Status: DC
Start: 2023-11-03 — End: 2023-11-03

## 2023-11-03 MED ORDER — OXYCODONE HCL 5 MG PO TABS
5.0000 mg | ORAL_TABLET | ORAL | Status: DC | PRN
  Administered 2023-11-03 – 2023-11-04 (×4): 10 mg via ORAL
  Filled 2023-11-03 (×4): qty 2

## 2023-11-03 MED ORDER — MIDAZOLAM HCL 2 MG/2ML IJ SOLN
INTRAMUSCULAR | Status: AC
  Filled 2023-11-03: qty 2

## 2023-11-03 MED ORDER — FENTANYL CITRATE (PF) 250 MCG/5ML IJ SOLN
INTRAMUSCULAR | Status: DC | PRN
Start: 2023-11-03 — End: 2023-11-03
  Administered 2023-11-03: 50 ug via INTRAVENOUS
  Administered 2023-11-03: 100 ug via INTRAVENOUS

## 2023-11-03 MED ORDER — FENTANYL CITRATE (PF) 100 MCG/2ML IJ SOLN
25.0000 ug | INTRAMUSCULAR | Status: DC | PRN
  Administered 2023-11-03 (×3): 50 ug via INTRAVENOUS

## 2023-11-03 MED ORDER — ONDANSETRON HCL 4 MG/2ML IJ SOLN
INTRAMUSCULAR | Status: DC | PRN
  Administered 2023-11-03: 4 mg via INTRAVENOUS

## 2023-11-03 MED ORDER — ONDANSETRON HCL 4 MG PO TABS
4.0000 mg | ORAL_TABLET | Freq: Four times a day (QID) | ORAL | Status: DC | PRN
Start: 2023-11-03 — End: 2023-11-04

## 2023-11-03 MED ORDER — DEXAMETHASONE SODIUM PHOSPHATE 10 MG/ML IJ SOLN
INTRAMUSCULAR | Status: DC | PRN
  Administered 2023-11-03: 10 mg via INTRAVENOUS

## 2023-11-03 MED ORDER — LACTATED RINGERS IV SOLN: INTRAVENOUS | Status: DC

## 2023-11-03 MED ORDER — ONDANSETRON HCL 4 MG/2ML IJ SOLN
INTRAMUSCULAR | Status: AC
  Filled 2023-11-03: qty 2

## 2023-11-03 MED ORDER — STERILE WATER FOR IRRIGATION IR SOLN
Status: DC | PRN
  Administered 2023-11-03: 1000 mL

## 2023-11-03 MED ORDER — ORAL CARE MOUTH RINSE: 15.0000 mL | Freq: Once | OROMUCOSAL | Status: AC

## 2023-11-03 MED ORDER — KETOROLAC TROMETHAMINE 15 MG/ML IJ SOLN
INTRAMUSCULAR | Status: AC
  Filled 2023-11-03: qty 1

## 2023-11-03 MED ORDER — SUGAMMADEX SODIUM 200 MG/2ML IV SOLN
INTRAVENOUS | Status: DC | PRN
  Administered 2023-11-03: 200 mg via INTRAVENOUS

## 2023-11-03 MED ORDER — FENTANYL CITRATE (PF) 100 MCG/2ML IJ SOLN
INTRAMUSCULAR | Status: AC
  Filled 2023-11-03: qty 2

## 2023-11-03 MED ORDER — EPHEDRINE SULFATE-NACL 50-0.9 MG/10ML-% IV SOSY
PREFILLED_SYRINGE | INTRAVENOUS | Status: DC | PRN
  Administered 2023-11-03 (×3): 5 mg via INTRAVENOUS

## 2023-11-03 MED ORDER — CHLORHEXIDINE GLUCONATE 0.12 % MT SOLN
15.0000 mL | Freq: Once | OROMUCOSAL | Status: AC
  Administered 2023-11-03: 15 mL via OROMUCOSAL
  Filled 2023-11-03: qty 15

## 2023-11-03 MED ORDER — BUPIVACAINE HCL (PF) 0.5 % IJ SOLN
INTRAMUSCULAR | Status: AC
Start: 2023-11-03 — End: ?
  Filled 2023-11-03: qty 30

## 2023-11-03 MED ORDER — HYDROMORPHONE HCL 1 MG/ML IJ SOLN
INTRAMUSCULAR | Status: AC
  Filled 2023-11-03: qty 1

## 2023-11-03 MED ORDER — OXYCODONE HCL 5 MG PO TABS
5.0000 mg | ORAL_TABLET | ORAL | 0 refills | Status: DC | PRN
  Filled 2023-11-03: qty 20, 5d supply, fill #0

## 2023-11-03 MED ORDER — SUCCINYLCHOLINE CHLORIDE 200 MG/10ML IV SOSY
PREFILLED_SYRINGE | INTRAVENOUS | Status: DC | PRN
  Administered 2023-11-03: 140 mg via INTRAVENOUS

## 2023-11-03 MED ORDER — ACETAMINOPHEN 500 MG PO TABS
1000.0000 mg | ORAL_TABLET | Freq: Four times a day (QID) | ORAL | Status: DC
  Administered 2023-11-03 – 2023-11-04 (×3): 1000 mg via ORAL
  Filled 2023-11-03 (×3): qty 2

## 2023-11-03 MED ORDER — CEFAZOLIN SODIUM-DEXTROSE 2-4 GM/100ML-% IV SOLN
2.0000 g | INTRAVENOUS | Status: AC
  Administered 2023-11-03: 2 g via INTRAVENOUS
  Filled 2023-11-03: qty 100

## 2023-11-03 MED ORDER — FLUORESCEIN SODIUM 10 % IV SOLN
INTRAVENOUS | Status: AC
  Filled 2023-11-03: qty 5

## 2023-11-03 MED ORDER — SPIRONOLACTONE 100 MG PO TABS
100.0000 mg | ORAL_TABLET | Freq: Every day | ORAL | Status: DC
  Administered 2023-11-03: 100 mg via ORAL
  Filled 2023-11-03 (×2): qty 1

## 2023-11-03 MED ORDER — HYDROMORPHONE HCL 1 MG/ML IJ SOLN
0.2000 mg | INTRAMUSCULAR | Status: DC | PRN
  Administered 2023-11-04 (×2): 0.6 mg via INTRAVENOUS
  Filled 2023-11-03 (×2): qty 1

## 2023-11-03 MED ORDER — LABETALOL HCL 5 MG/ML IV SOLN
INTRAVENOUS | Status: DC | PRN
Start: 2023-11-03 — End: 2023-11-03
  Administered 2023-11-03 (×6): 2.5 mg via INTRAVENOUS

## 2023-11-03 MED ORDER — KETOROLAC TROMETHAMINE 15 MG/ML IJ SOLN
15.0000 mg | Freq: Once | INTRAMUSCULAR | Status: AC
  Administered 2023-11-03: 15 mg via INTRAVENOUS

## 2023-11-03 MED ORDER — NEBIVOLOL HCL 10 MG PO TABS
10.0000 mg | ORAL_TABLET | Freq: Every day | ORAL | Status: DC
  Filled 2023-11-03: qty 1

## 2023-11-03 MED ORDER — DEXAMETHASONE SODIUM PHOSPHATE 10 MG/ML IJ SOLN
INTRAMUSCULAR | Status: AC
  Filled 2023-11-03: qty 1

## 2023-11-03 MED ORDER — ACETAMINOPHEN 500 MG PO TABS
1000.0000 mg | ORAL_TABLET | ORAL | Status: AC
  Administered 2023-11-03: 1000 mg via ORAL
  Filled 2023-11-03: qty 2

## 2023-11-03 MED ORDER — SUCCINYLCHOLINE CHLORIDE 200 MG/10ML IV SOSY
PREFILLED_SYRINGE | INTRAVENOUS | Status: AC
  Filled 2023-11-03: qty 10

## 2023-11-03 MED ORDER — ONDANSETRON HCL 4 MG/2ML IJ SOLN
4.0000 mg | Freq: Four times a day (QID) | INTRAMUSCULAR | Status: DC | PRN
Start: 2023-11-03 — End: 2023-11-04

## 2023-11-03 MED ORDER — HYDROMORPHONE HCL 1 MG/ML IJ SOLN
0.5000 mg | Freq: Once | INTRAMUSCULAR | Status: AC
  Administered 2023-11-03: 0.5 mg via INTRAVENOUS

## 2023-11-03 MED ORDER — KETOROLAC TROMETHAMINE 30 MG/ML IJ SOLN
INTRAMUSCULAR | Status: AC
  Filled 2023-11-03: qty 1

## 2023-11-03 MED ORDER — HEMOSTATIC AGENTS (NO CHARGE) OPTIME
TOPICAL | Status: DC | PRN
  Administered 2023-11-03: 1

## 2023-11-03 MED ORDER — EPHEDRINE 5 MG/ML INJ
INTRAVENOUS | Status: AC
  Filled 2023-11-03: qty 5

## 2023-11-03 MED ORDER — PROPOFOL 10 MG/ML IV BOLUS
INTRAVENOUS | Status: DC | PRN
  Administered 2023-11-03: 160 mg via INTRAVENOUS

## 2023-11-03 MED ORDER — FENTANYL CITRATE (PF) 250 MCG/5ML IJ SOLN
INTRAMUSCULAR | Status: AC
  Filled 2023-11-03: qty 5

## 2023-11-03 MED ORDER — 0.9 % SODIUM CHLORIDE (POUR BTL) OPTIME: TOPICAL | Status: DC | PRN

## 2023-11-03 MED ORDER — POLYETHYLENE GLYCOL 3350 17 G PO PACK: 17.0000 g | PACK | Freq: Every day | ORAL | Status: DC | PRN

## 2023-11-03 MED ORDER — SODIUM CHLORIDE 0.9 % IV SOLN: INTRAVENOUS | Status: DC

## 2023-11-03 MED ORDER — ACETAMINOPHEN 500 MG PO TABS
500.0000 mg | ORAL_TABLET | ORAL | 1 refills | Status: DC | PRN
  Filled 2023-11-03: qty 120, 60d supply, fill #0

## 2023-11-03 MED ORDER — LABETALOL HCL 5 MG/ML IV SOLN
INTRAVENOUS | Status: AC
  Filled 2023-11-03: qty 4

## 2023-11-03 MED ORDER — ROCURONIUM BROMIDE 10 MG/ML (PF) SYRINGE
PREFILLED_SYRINGE | INTRAVENOUS | Status: DC | PRN
  Administered 2023-11-03: 30 mg via INTRAVENOUS
  Administered 2023-11-03: 20 mg via INTRAVENOUS

## 2023-11-03 MED ORDER — BUPIVACAINE HCL (PF) 0.5 % IJ SOLN
INTRAMUSCULAR | Status: DC | PRN
  Administered 2023-11-03: 9 mL
  Administered 2023-11-03: 10 mL

## 2023-11-03 SURGICAL SUPPLY — 68 items
APPLICATOR ARISTA FLEXITIP XL (MISCELLANEOUS) IMPLANT
BARRIER ADHS 3X4 INTERCEED (GAUZE/BANDAGES/DRESSINGS) IMPLANT
CHLORAPREP W/TINT 26 (MISCELLANEOUS) ×2 IMPLANT
COVER BACK TABLE 60X90IN (DRAPES) ×2 IMPLANT
COVER TIP SHEARS 8 DVNC (MISCELLANEOUS) ×2 IMPLANT
DEFOGGER SCOPE WARMER CLEARIFY (MISCELLANEOUS) ×2 IMPLANT
DERMABOND ADVANCED .7 DNX12 (GAUZE/BANDAGES/DRESSINGS) ×2 IMPLANT
DRAPE ARM DVNC X/XI (DISPOSABLE) ×8 IMPLANT
DRAPE COLUMN DVNC XI (DISPOSABLE) ×2 IMPLANT
DRAPE SURG IRRIG POUCH 19X23 (DRAPES) ×2 IMPLANT
DRAPE UTILITY XL STRL (DRAPES) ×2 IMPLANT
DRIVER NDL MEGA SUTCUT DVNCXI (INSTRUMENTS) ×2 IMPLANT
DRIVER NDLE MEGA SUTCUT DVNCXI (INSTRUMENTS) ×2 IMPLANT
ELECT REM PT RETURN 9FT ADLT (ELECTROSURGICAL) ×2 IMPLANT
ELECTRODE REM PT RTRN 9FT ADLT (ELECTROSURGICAL) ×2 IMPLANT
FORCEPS BPLR FENES DVNC XI (FORCEP) ×2 IMPLANT
FORCEPS PROGRASP DVNC XI (FORCEP) ×2 IMPLANT
GAUZE 4X4 16PLY ~~LOC~~+RFID DBL (SPONGE) ×4 IMPLANT
GLOVE BIOGEL PI IND STRL 7.0 (GLOVE) ×6 IMPLANT
GLOVE ECLIPSE 7.0 STRL STRAW (GLOVE) ×6 IMPLANT
GOWN STRL REUS W/ TWL LRG LVL3 (GOWN DISPOSABLE) IMPLANT
GOWN STRL REUS W/ TWL XL LVL3 (GOWN DISPOSABLE) ×8 IMPLANT
HEMOSTAT ARISTA ABSORB 3G PWDR (HEMOSTASIS) IMPLANT
HIBICLENS CHG 4% 4OZ BTL (MISCELLANEOUS) ×4 IMPLANT
HOLDER FOLEY CATH W/STRAP (MISCELLANEOUS) ×2 IMPLANT
IRRIG SUCT STRYKERFLOW 2 WTIP (MISCELLANEOUS) ×2 IMPLANT
IRRIGATION SUCT STRKRFLW 2 WTP (MISCELLANEOUS) ×2 IMPLANT
IV NS 1000ML BAXH (IV SOLUTION) IMPLANT
KIT PINK PAD W/HEAD ARE REST (MISCELLANEOUS) ×2 IMPLANT
KIT PINK PAD W/HEAD ARM REST (MISCELLANEOUS) ×2 IMPLANT
KIT TURNOVER KIT B (KITS) ×2 IMPLANT
LEGGING LITHOTOMY PAIR STRL (DRAPES) ×2 IMPLANT
MANIFOLD NEPTUNE II (INSTRUMENTS) IMPLANT
NDL INSUFFLATION 14GA 120MM (NEEDLE) IMPLANT
NDL SPNL 18GX3.5 QUINCKE PK (NEEDLE) IMPLANT
NDL SPNL 25GX3.5 QUINCKE BL (NEEDLE) IMPLANT
NEEDLE INSUFFLATION 14GA 120MM (NEEDLE) IMPLANT
NEEDLE SPNL 18GX3.5 QUINCKE PK (NEEDLE) ×2 IMPLANT
NEEDLE SPNL 25GX3.5 QUINCKE BL (NEEDLE) ×2 IMPLANT
NS IRRIG 1000ML POUR BTL (IV SOLUTION) ×2 IMPLANT
OBTURATOR OPTICAL STND 8 DVNC (TROCAR) ×2 IMPLANT
OBTURATOR OPTICALSTD 8 DVNC (TROCAR) ×2 IMPLANT
OCCLUDER COLPOPNEUMO (BALLOONS) ×2 IMPLANT
PACK ROBOT WH (CUSTOM PROCEDURE TRAY) ×2 IMPLANT
PAD OB MATERNITY 11 LF (PERSONAL CARE ITEMS) ×2 IMPLANT
PAD POSITIONING PINK XL (MISCELLANEOUS) ×2 IMPLANT
RUMI II 3.0CM BLUE KOH-EFFICIE (DISPOSABLE) IMPLANT
SCISSORS MNPLR CVD DVNC XI (INSTRUMENTS) ×2 IMPLANT
SEAL UNIV 5-12 XI (MISCELLANEOUS) ×8 IMPLANT
SEALER VESSEL EXT DVNC XI (MISCELLANEOUS) ×2 IMPLANT
SET CYSTO W/LG BORE CLAMP LF (SET/KITS/TRAYS/PACK) ×2 IMPLANT
SET TRI-LUMEN FLTR TB AIRSEAL (TUBING) ×2 IMPLANT
SPIKE FLUID TRANSFER (MISCELLANEOUS) ×2 IMPLANT
SUT VIC AB 0 CT1 27XBRD ANBCTR (SUTURE) ×2 IMPLANT
SUT VIC AB 4-0 PS2 18 (SUTURE) ×4 IMPLANT
SUT VLOC 180 0 9IN GS21 (SUTURE) ×2 IMPLANT
SYS BAG RETRIEVAL 10MM (BASKET) IMPLANT
SYSTEM BAG RETRIEVAL 10MM (BASKET) IMPLANT
TIP RUMI ORANGE 6.7MMX12CM (TIP) IMPLANT
TIP UTERINE 5.1X6CM LAV DISP (MISCELLANEOUS) IMPLANT
TIP UTERINE 6.7X10CM GRN DISP (MISCELLANEOUS) IMPLANT
TIP UTERINE 6.7X6CM WHT DISP (MISCELLANEOUS) IMPLANT
TIP UTERINE 6.7X8CM BLUE DISP (MISCELLANEOUS) IMPLANT
TOWEL GREEN STERILE (TOWEL DISPOSABLE) ×2 IMPLANT
TRAY FOLEY W/BAG SLVR 14FR (SET/KITS/TRAYS/PACK) ×2 IMPLANT
TROCAR PORT AIRSEAL 8X120 (TROCAR) ×2 IMPLANT
UNDERPAD 30X36 HEAVY ABSORB (UNDERPADS AND DIAPERS) ×2 IMPLANT
WATER STERILE IRR 1000ML POUR (IV SOLUTION) IMPLANT

## 2023-11-03 NOTE — Transfer of Care (Signed)
 Immediate Anesthesia Transfer of Care Note  Patient: Mary Sanford  Procedure(s) Performed: XI ROBOTIC ASSISTED LAPAROSCOPIC HYSTERECTOMY AND SALPINGO-OOPHERECTOMY (Bilateral: Pelvis) CYSTOSCOPY (Bladder)  Patient Location: PACU  Anesthesia Type:General  Level of Consciousness: drowsy and responds to stimulation  Airway & Oxygen Therapy: Patient Spontanous Breathing and Patient connected to face mask oxygen  Post-op Assessment: Report given to RN and Post -op Vital signs reviewed and stable  Post vital signs: Reviewed and stable  Last Vitals:  Vitals Value Taken Time  BP 167/86 11/03/23 1356  Temp    Pulse 78 11/03/23 1358  Resp 19 11/03/23 1359  SpO2 100 % 11/03/23 1358  Vitals shown include unfiled device data.  Last Pain:  Vitals:   11/03/23 1028  TempSrc: Oral         Complications: There were no known notable events for this encounter.

## 2023-11-03 NOTE — H&P (Signed)
 OB/GYN Pre-Op History and Physical  Mary Sanford is a 58 y.o. [id number] presenting for surgical management.       Past Medical History:  Diagnosis Date   Arthritis    hands, left knee   Chronic low back pain    Depression    Dyslipidemia    Family history of adverse reaction to anesthesia    one sister ponv and slow to awaken   GAD (generalized anxiety disorder)    History of kidney stones    Hyperaldosteronism (HCC)    followed by pcp   (abd MRI in epic 10-13-2023 normal)   Iron deficiency anemia    Malignant hypertension    ED visit in epic 09-22-2023 HTN urgency, bp 165/101;   previously sent to GYN office 09-15-2023 for bp 193/ 115   Panic disorder    PMB (postmenopausal bleeding)    Pre-diabetes    followed by pcp   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 Andrey Campanile   Seasonal allergies    SUI (stress urinary incontinence, female)    Uterine fibroid    Wears contact lenses    Wears glasses     Past Surgical History:  Procedure Laterality Date   BUNIONECTOMY Bilateral    CESAREAN SECTION  1989   COLONOSCOPY WITH PROPOFOL  11/05/2020   dr Mortimer Fries   CYSTOSCOPY W/ RETROGRADES Left 06/28/2018   Procedure: CYSTOSCOPY WITH RETROGRADE PYELOGRAM LEFT STENT;  Surgeon: Rene Paci, MD;  Location: [facility];  Service: Urology;  Laterality: Left;   CYSTOSCOPY/URETEROSCOPY/HOLMIUM LASER/STENT PLACEMENT Left 07/16/2018   Procedure: CYSTOSCOPY/URETEROSCOPY/HOLMIUM LASER/STENT EXCHANGE;  Surgeon: Rene Paci, MD;  Location: [facility];  Service: Urology;  Laterality: Left;   DILATATION & CURETTAGE/HYSTEROSCOPY WITH MYOSURE N/A 12/13/2019   Procedure: DILATATION & CURETTAGE/HYSTEROSCOPY WITH MYOSURE;  Surgeon: Romualdo Bolk, MD;  Location: [facility];  Service: Gynecology;  Laterality: N/A;   DILITATION & CURRETTAGE/HYSTROSCOPY WITH NOVASURE ABLATION N/A 06/17/2013   Procedure: DILATATION &  CURETTAGE/HYSTEROSCOPY WITH NOVASURE ABLATION;  Surgeon: Tereso Newcomer, MD;  Location: [facility];  Service: Gynecology;  Laterality: N/A;   ESOPHAGOGASTRODUODENOSCOPY (EGD) WITH PROPOFOL  10/11/2020   dr Mortimer Fries   FINGER SURGERY  04/2021   thumb   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   Procedure: LAPAROSCOPIC ROUX-EN-Y GASTRIC BYPASS WITH UPPER ENDOSCOPY;  Surgeon: Gaynelle Adu, MD;  Location: [facility];  Service: General;  Laterality: N/A;   HEMORRHOIDECTOMY WITH HEMORRHOID BANDING  10/19/2012   SCA THD Hem ligation/pexy   LAPAROSCOPIC TUBAL LIGATION Bilateral 2001   OPERATIVE ULTRASOUND N/A 12/13/2019   Procedure: OPERATIVE ULTRASOUND;  Surgeon: Romualdo Bolk, MD;  Location: [facility] ;  Service: Gynecology;  Laterality: N/A;   WISDOM TOOTH EXTRACTION      OB History  Gravida Para Term Preterm AB Living  7 5 4 1 2 5   SAB IAB Ectopic Multiple Live Births  0 2 0 0 5    # Outcome Date GA Lbr Len/2nd Weight Sex Type Anes PTL Lv  7 IAB           6 IAB           5 Preterm      CS-LTranv   LIV  4 Term      Vag-Spont   LIV  3 Term      Vag-Spont   LIV  2 Term      Vag-Spont   LIV  1 Term  Vag-Spont   LIV    Social History   Socioeconomic History   Marital status: Married    Spouse name: Not on file   Number of children: Not on file   Years of education: Not on file   Highest education level: Some college, no degree  Occupational History   Occupation: minister  Tobacco Use   Smoking status: Former    Types: Cigarettes   Smokeless tobacco: Never   Tobacco comments:    10-27-2023  pt stated quit in 2000/  started age 47 (18)  for 16 yrs  Vaping Use   Vaping status: Never Used  Substance and Sexual Activity   Alcohol use: Yes    Comment: per pt could 1-2 bottles per week   Drug use: Not Currently    Types: Marijuana    Comment: last used 1990s   Sexual activity: Yes    Partners: Male    Birth control/protection: Surgical, Post-menopausal     Comment: BTL, Ablation  Other Topics Concern   Not on file  Social History Narrative   Not on file   Social Drivers of Health   Financial Resource Strain: Low Risk  (11/03/2022)   Overall Financial Resource Strain (CARDIA)    Difficulty of Paying Living Expenses: Not very hard  Food Insecurity: No Food Insecurity (08/19/2023)   Hunger Vital Sign    Worried About Running Out of Food in the Last Year: Never true    Ran Out of Food in the Last Year: Never true  Transportation Needs: No Transportation Needs (08/19/2023)   PRAPARE - Administrator, Civil Service (Medical): No    Lack of Transportation (Non-Medical): No  Physical Activity: Insufficiently Active (11/03/2022)   Exercise Vital Sign    Days of Exercise per Week: 3 days    Minutes of Exercise per Session: 30 min  Stress: Stress Concern Present (11/03/2022)   [employer] of Occupational Health - Occupational Stress Questionnaire    Feeling of Stress : Very much  Social Connections: Socially Integrated (11/03/2022)   Social Connection and Isolation Panel [NHANES]    Frequency of Communication with Friends and Family: More than three times a week    Frequency of Social Gatherings with Friends and Family: Once a week    Attends Religious Services: More than 4 times per year    Active Member of [employer] or Organizations: Yes    Attends Engineer, structural: More than 4 times per year    Marital Status: Married    Family History  Problem Relation Age of Onset   Hypertension Mother    Hypothyroidism Mother    Rectal cancer Mother 65   Colon cancer Mother 59   Colon polyps Mother    Hypotension Sister    Hypothyroidism Sister    Diabetes Sister    Other Sister        pacemaker   Seizures Brother    Hypothyroidism Sister    Seizures Sister    Anemia Sister    Deep vein thrombosis Sister    Seizures Paternal Grandfather    Stroke Maternal Uncle        > 55   Diabetes Maternal Grandmother    Kidney  failure Maternal Grandmother    Heart attack Maternal Grandfather    Other Paternal Grandmother        tumors throughout body   Hypertension Maternal Uncle    Esophageal cancer Neg Hx    Stomach cancer Neg  Hx    Heart disease Neg Hx     Medications Prior to Admission  Medication Sig Dispense Refill Last Dose/Taking   acetaminophen (TYLENOL) 500 MG tablet Take 500-1,000 mg by mouth as needed for headache or moderate pain.   Past Week   amLODipine (NORVASC) 5 MG tablet Take 2 tablets (10 mg total) by mouth daily. (Patient taking differently: Take 2 tablets (10 mg total) by mouth daily.) 90 tablet 0 11/03/2023 at  7:00 AM   atorvastatin (LIPITOR) 10 MG tablet Take 1 tablet (10 mg total) by mouth daily. (Patient taking differently: Take 10 mg by mouth daily.) 90 tablet 1 11/03/2023 at  7:00 AM   escitalopram (LEXAPRO) 20 MG tablet Take 1 tablet by mouth once daily 90 tablet 0 11/03/2023 at  7:00 AM   Ibuprofen-Acetaminophen (ADVIL DUAL ACTION) 125-250 MG TABS Take 1 tablet by mouth every 8 (eight) hours as needed (pain).   Past Month   lidocaine (LIDODERM) 5 % Place 1 patch onto the skin daily. Remove & Discard patch within 12 hours or as directed by MD (Patient taking differently: Place 1 patch onto the skin daily as needed (pain). Remove & Discard patch within 12 hours or as directed by MD) 14 patch 0 Past Month   nebivolol (BYSTOLIC) 10 MG tablet Take 1 tablet by mouth once daily (Patient taking differently: Take 10 mg by mouth daily.) 90 tablet 0 11/03/2023 at  7:00 AM   spironolactone (ALDACTONE) 100 MG tablet Take 1 tablet (100 mg total) by mouth daily. (Patient taking differently: Take 100 mg by mouth daily.) 90 tablet 0 11/02/2023   Tetrahydrozoline HCl (VISION CLEAR OP) Place into both eyes as needed (for contacts).   Past Week   Menthol, Topical Analgesic, (ICY HOT EX) Apply 1 Application topically daily as needed (pain).   Unknown   Misc Natural Products (BEET ROOT PO) Take 2 each by mouth daily.  Super Root chew x2 in am   11/01/2023   tiZANidine (ZANAFLEX) 2 MG tablet Take 1-2 tablets (2-4 mg total) by mouth every 8 (eight) hours as needed for muscle spasms. 60 tablet 2 10/31/2023    Allergies  Allergen Reactions   Naproxen Other (See Comments)    Panic attacks No Problem with Motrin    Review of Systems: Negative except for what is mentioned in HPI.     Physical Exam: BP (!) 137/97   Pulse 65   Temp 98.5 F (36.9 C) (Oral)   Resp 17   Ht 5\' 9"  (1.753 m)   Wt 88.5 kg   LMP 05/15/2021 (Approximate)   SpO2 98%   BMI 28.80 kg/m  CONSTITUTIONAL: Well-developed, well-nourished and in no acute distress.  HENT:  Normocephalic, atraumatic, External right and left ear normal. Oropharynx is clear and moist EYES: Conjunctivae and EOM are normal. Pupils are equal, round, and reactive to light. No scleral icterus.  NECK: Normal range of motion, supple, no masses SKIN: Skin is warm and dry. No rash noted. Not diaphoretic. No erythema. No pallor. NEUROLGIC: Alert and oriented to person, place, and time. Normal reflexes, muscle tone coordination. No cranial nerve deficit noted. PSYCHIATRIC: Normal mood and affect. Normal behavior. Normal judgment and thought content. RESPIRATORY: Normal effort PELVIC: Deferred   Pertinent Labs/Studies:   No results found for this or any previous visit (from the past 72 hours).     Assessment and Plan :MIALEE WEYMAN is a 58 y.o. [id number] here for surgical management.   Patient desires surgical  management with RA-TLH, BSO, cysto.  The risks of surgery were discussed in detail with the patient including but not limited to: bleeding which may require transfusion or reoperation; infection which may require prolonged hospitalization or re-hospitalization and antibiotic therapy; injury to bowel, bladder, ureters and major vessels or other surrounding organs which may lead to other procedures; formation of adhesions; need for additional procedures  including laparotomy or subsequent procedures secondary to intraoperative injury or abnormal pathology; thromboembolic phenomenon; incisional problems and other postoperative or anesthesia complications.  Patient was told that the likelihood that her condition and symptoms will be treated effectively with this surgical management was high; the postoperative expectations were also discussed in detail. The patient also understands the alternative treatment options which were discussed in full.   Lorriane Shire, M.D. Minimally Invasive Gynecologic Surgery and Pelvic Pain Specialist Attending Obstetrician & Gynecologist, [facility] for [employer], [facility]

## 2023-11-03 NOTE — Anesthesia Preprocedure Evaluation (Signed)
 Anesthesia Evaluation  Patient identified by MRN, date of birth, ID band Patient awake    Reviewed: Allergy & Precautions, NPO status , Patient's Chart, lab work & pertinent test results, reviewed documented beta blocker date and time   Airway Mallampati: II  TM Distance: >3 FB Neck ROM: Full    Dental  (+) Teeth Intact, Dental Advisory Given   Pulmonary former smoker   Pulmonary exam normal breath sounds clear to auscultation       Cardiovascular hypertension, Pt. on medications and Pt. on home beta blockers Normal cardiovascular exam Rhythm:Regular Rate:Normal     Neuro/Psych  PSYCHIATRIC DISORDERS Anxiety Depression    negative neurological ROS     GI/Hepatic negative GI ROS, Neg liver ROS,,,S/P gastric bypass   Endo/Other  Hyperaldosteronism  Renal/GU negative Renal ROS Bladder dysfunction      Musculoskeletal  (+) Arthritis ,    Abdominal   Peds  Hematology negative hematology ROS (+)   Anesthesia Other Findings Day of surgery medications reviewed with the patient.  Reproductive/Obstetrics Postmenopausal bleeding                             Anesthesia Physical Anesthesia Plan  ASA: 3  Anesthesia Plan: General   Post-op Pain Management: Tylenol PO (pre-op)*   Induction: Intravenous  PONV Risk Score and Plan: 3 and Midazolam, Dexamethasone and Ondansetron  Airway Management Planned: Oral ETT  Additional Equipment:   Intra-op Plan:   Post-operative Plan: Extubation in OR  Informed Consent: I have reviewed the patients History and Physical, chart, labs and discussed the procedure including the risks, benefits and alternatives for the proposed anesthesia with the patient or authorized representative who has indicated his/her understanding and acceptance.     Dental advisory given  Plan Discussed with: CRNA  Anesthesia Plan Comments:        Anesthesia Quick  Evaluation

## 2023-11-03 NOTE — Op Note (Signed)
 Mary Sanford PROCEDURE DATE: 11/03/2023  PREOPERATIVE DIAGNOSIS:  Postmenopausal bleeding POSTOPERATIVE DIAGNOSIS: Postmenopausal bleeding PROCEDURE:    Robotic assisted total laparoscopic hysterectomy, bilateral salpingo-oophorectomy, cystoscopy SURGEON: Lorriane Shire, MD ASSISTANT:  Claudie Revering, MD  ; Clement Sayres, [place]  An experienced assistant was required given the standard of surgical care given the complexity of the case.  This assistant was needed for exposure, dissection, suctioning, retraction, instrument exchange, and for overall help during the procedure.  INDICATIONS: 58 y.o. [id number] with AUB.  Risks of surgery were discussed with the patient including but not limited to: bleeding which may require transfusion; infection which may require antibiotics; injury to surrounding organs; need for additional procedures including laparotomy;  and other postoperative/anesthesia complications. Written informed consent was obtained.    FINDINGS:  Normal external genitalia, 8 wk size mobile uterus with Normal contours.  Laparoscopically: filmy adhesions to the liver, normal uterus, surgically ligated fallopian tubes, normal bilateral ovaries, bilateral ureters seen, normal anterior cul de sac, normal posterior cul de sac Cystoscopically: normal bladder wall without apparent injury, bilateral ureteral orifices, urine from bilateral ureteral orifices   ANESTHESIA: General, paracervial block INTRAVENOUS FLUIDS:  700 ml of LR ESTIMATED BLOOD LOSS:  20 ml URINE OUTPUT: 175 ml SPECIMENS: uterus, cervix, bilateral fallopian tubes and ovaries COMPLICATIONS:  None immediate.  The risks, benefits, and alternatives of surgery were explained, understood, and accepted. Consents were signed. All questions were answered. She was taken to the operating room and general anesthesia was applied without complication. She was placed in the dorsal lithotomy position and her abdomen and vagina were prepped and  draped after she had been carefully positioned on the table. A bimanual exam revealed a 8 week size uterus that was mobile. Her adnexa were not enlarged. A Foley catheter was placed and it drained clear throughout the case. A speculum was placed and the cervix visualized. The anterior lip of the cervix was grasped and a paracervical block administered. The cervix was measured and the uterus was sounded to 8 cm. A Rumi uterine manipulator was placed without difficulty.  Gloves were changed and attention was turned to the abdomen. All incisions were infiltrated with local anesthetic. An 8mm incision was made in the umbilicus and an optiview robotic trocar was introduced into the abdomen. The Entry was confirmed with low opening intraabdominal pressure and visualization and the abdomen was then insufflated. After good pneumoperitoneum was established, the abdomen was surveyed including the upper abdomen. She was placed in Trendelenburg position and ports were placed in appropriate positions on her abdomen to allow maximum exposure during the robotic case. Specifically, trocars were placed LUQ, LLQ, RLQ, and RUQ.  These were all placed under direct laparoscopic visualization after infiltration with local anesthetic. The robot was docked and I proceeded with a robotic portion of the case.  The pelvis was inspected and the above findings were noted. The left round ligament was cauterized and divided. The leaves of the broad ligament were divided. The peritoneum parallel to the right ovary was incised, the ureter identified and the peritoneum superior to the ureter was incised. The IP ligament was cauterized and divided. The posterior leaf of the broad ligament was divided to the colpotomy cup. The anterior leaf was divided and the bladder moved inferiorly. The same was carried out on the contralateral side. The bladder flap was developed anteriorly and the pubocervical fascia identified. The uterine vessels were  identified and cauterized and then cut and lateralized to the cup edge. The bladder  was pushed out of the operative site and an anterior colpotomy was made. The colpotomy incision was extended circumferentially, following the blue outline of the Rumi manipulator. There was bleeding noted from the left uterine pedicle, this was treated with the vessel sealer.  The uterus was removed from the vagina with the fallopian tube segments. All pedicles were hemostatic.The vaginal cuff was closed with v-lock suture.  Hemostasis was noted throughout. The pelvis was irrigated. The intraabdominal pressure was lowered assess hemostasis. After determining adequate hemostasis, arista applied to the cuff and the robot was undocked . At this point I performed cystoscopy. The cystoscopy revealed ejection of urine from both ureters.   The skin from all of the other ports was closed with 4-0 vicryl. The patient was then extubated and taken to recovery in stable condition.   Sponge, lap and needle counts were correct x 2.    Lorriane Shire, MD Minimally Invasive Gynecologic Surgery  Obstetrics and Gynecology, [facility] for [facility], [facility] 11/03/2023

## 2023-11-03 NOTE — Anesthesia Procedure Notes (Signed)
 Procedure Name: Intubation Date/Time: 11/03/2023 12:16 PM  Performed by: Cy Blamer, CRNAPre-anesthesia Checklist: Patient identified, Emergency Drugs available, Suction available and Patient being monitored Patient Re-evaluated:Patient Re-evaluated prior to induction Oxygen Delivery Method: Circle system utilized Preoxygenation: Pre-oxygenation with 100% oxygen Induction Type: IV induction and Rapid sequence Laryngoscope Size: Mac and 4 Grade View: Grade I Tube type: Oral Tube size: 7.0 mm Number of attempts: 1 Airway Equipment and Method: Stylet and Bite block Placement Confirmation: ETT inserted through vocal cords under direct vision, positive ETCO2 and breath sounds checked- equal and bilateral Secured at: 21 cm Tube secured with: Tape Dental Injury: Teeth and Oropharynx as per pre-operative assessment  Comments: Intubation performed by Sonda Rumble, SRNA Elective RSI to avoid oral gastric decompression

## 2023-11-03 NOTE — Discharge Instructions (Addendum)
 Post Op Hysterectomy Instructions Please read the instructions below. Refer to these instructions for the next few weeks. These instructions provide you with general information on caring for yourself after surgery. Your caregiver may also give you specific instructions. While your treatment has been planned according to the most current medical practices available, unavoidable problems sometimes happen. If you have any problems or questions after you leave, please call your caregiver.  HOME CARE INSTRUCTIONS Healing will take time. You will have discomfort, tenderness, swelling and bruising at the operative site for a couple of weeks. This is normal and will get better as time goes on.  Only take over-the-counter or prescription medicines for pain, discomfort or fever as directed by your caregiver.  Do not take aspirin. It can cause bleeding.  Do not drive when taking pain medication.  Follow your caregiver's advice regarding diet, exercise, lifting, driving and general activities.  Resume your usual diet as directed and allowed.  Get plenty of rest and sleep.  Do not douche, use tampons, or have sexual intercourse until your caregiver gives you permission. .  Take your temperature if you feel hot or flushed.  You may shower today when you get home.  No tub bath for one week.   Do not drink alcohol until you are not taking any narcotic pain medications.  Try to have someone home with you for a week or two to help with the household activities.   Be careful over the next two to three weeks with any activities at home that involve lifting, pushing, or pulling.  Listen to your body--if something feels uncomfortable to do, then don't do it. Make sure you and your family understands everything about your operation and recovery.  Walking up stairs is fine. Do not sign any legal documents until you feel normal again.  Keep all your follow-up appointments as recommended by your caregiver.   PLEASE CALL  THE OFFICE IF: There is swelling, redness or increasing pain in the wound area.  Pus is coming from the wound.  You notice a bad smell from the wound or surgical dressing.  You have pain, redness and swelling from the intravenous site.  The wound is breaking open (the edges are not staying together).   You develop pain or bleeding when you urinate.  You develop abnormal vaginal discharge.  You have any type of abnormal reaction or develop an allergy to your medication.  You need stronger pain medication for your pain   SEEK IMMEDIATE MEDICAL CARE: You develop a temperature of 100.5 or higher.  You develop abdominal pain.  You develop chest pain.  You develop shortness of breath.  You pass out.  You develop pain, swelling or redness of your leg.  You develop heavy vaginal bleeding with or without blood clots.   MEDICATIONS: Restart your regular medications BUT wait one week before restarting all vitamins and mineral supplements Take Tylenol 1000mg  every 8 hours for the next several days Use oxycodone 5 mg every 4-6 hours. Taking motrin and tylenol should help reduce how often you use oxycodone.  You may use an over the counter stool softener like Colace or Dulcolax to help with starting a bowel movement.  You can also use miralax (polyethylene glycol). Start the day after you go home.  Warm liquids, fluids, and ambulation help too.  If you have not had a bowel movement in four days, you need to call the office.

## 2023-11-03 NOTE — Brief Op Note (Signed)
 11/03/2023  1:34 PM  PATIENT:  Mary Sanford  58 y.o. female  PRE-OPERATIVE DIAGNOSIS:  Postmenopausal bleeding  POST-OPERATIVE DIAGNOSIS:  Postmenopausal bleeding  PROCEDURE:  Procedure(s): XI ROBOTIC ASSISTED LAPAROSCOPIC HYSTERECTOMY AND SALPINGO-OOPHERECTOMY (Bilateral) CYSTOSCOPY (N/A)  SURGEON:  Surgeons and Role:    Lorriane Shire, MD - Primary       PHYSICIAN ASSISTANT: Clement Sayres, PA  ASSISTANTS: Claudie Revering, MD   ANESTHESIA:   IV sedation and paracervical block  EBL:  20 mL   BLOOD ADMINISTERED:none  DRAINS: none   LOCAL MEDICATIONS USED:  MARCAINE     SPECIMEN:  Source of Specimen:  uterus, cervix, bilateral fallopian tubes and ovaries  DISPOSITION OF SPECIMEN:  PATHOLOGY  COUNTS:  YES  TOURNIQUET:  * No tourniquets in log *  DICTATION: .Note written in EPIC  PLAN OF CARE:  extended recovery  PATIENT DISPOSITION:  PACU - hemodynamically stable.   Delay start of Pharmacological VTE agent (>24hrs) due to surgical blood loss or risk of bleeding: not applicable

## 2023-11-04 ENCOUNTER — Other Ambulatory Visit (HOSPITAL_COMMUNITY): Payer: Self-pay

## 2023-11-04 ENCOUNTER — Encounter (HOSPITAL_COMMUNITY): Payer: Self-pay | Admitting: Obstetrics and Gynecology

## 2023-11-04 DIAGNOSIS — E269 Hyperaldosteronism, unspecified: Secondary | ICD-10-CM | POA: Diagnosis not present

## 2023-11-04 DIAGNOSIS — Z9884 Bariatric surgery status: Secondary | ICD-10-CM | POA: Diagnosis not present

## 2023-11-04 DIAGNOSIS — N950 Postmenopausal bleeding: Secondary | ICD-10-CM | POA: Diagnosis not present

## 2023-11-04 DIAGNOSIS — F419 Anxiety disorder, unspecified: Secondary | ICD-10-CM | POA: Diagnosis not present

## 2023-11-04 DIAGNOSIS — Z87891 Personal history of nicotine dependence: Secondary | ICD-10-CM | POA: Diagnosis not present

## 2023-11-04 DIAGNOSIS — Z79899 Other long term (current) drug therapy: Secondary | ICD-10-CM | POA: Diagnosis not present

## 2023-11-04 DIAGNOSIS — N72 Inflammatory disease of cervix uteri: Secondary | ICD-10-CM | POA: Diagnosis not present

## 2023-11-04 DIAGNOSIS — M1990 Unspecified osteoarthritis, unspecified site: Secondary | ICD-10-CM | POA: Diagnosis not present

## 2023-11-04 DIAGNOSIS — I10 Essential (primary) hypertension: Secondary | ICD-10-CM | POA: Diagnosis not present

## 2023-11-04 DIAGNOSIS — D251 Intramural leiomyoma of uterus: Secondary | ICD-10-CM | POA: Diagnosis not present

## 2023-11-04 DIAGNOSIS — N83291 Other ovarian cyst, right side: Secondary | ICD-10-CM | POA: Diagnosis not present

## 2023-11-04 DIAGNOSIS — N8003 Adenomyosis of the uterus: Secondary | ICD-10-CM | POA: Diagnosis not present

## 2023-11-04 DIAGNOSIS — F32A Depression, unspecified: Secondary | ICD-10-CM | POA: Diagnosis not present

## 2023-11-04 DIAGNOSIS — K660 Peritoneal adhesions (postprocedural) (postinfection): Secondary | ICD-10-CM | POA: Diagnosis not present

## 2023-11-04 DIAGNOSIS — N319 Neuromuscular dysfunction of bladder, unspecified: Secondary | ICD-10-CM | POA: Diagnosis not present

## 2023-11-04 NOTE — Progress Notes (Signed)
 Gynecology Progress Note  Admission Date: 11/03/2023 Current Date: 11/04/2023 8:20 AM  Mary Sanford is a 58 y.o. [id number] HD#2/POS#1 admitted for postop observation   History complicated by: Patient Active Problem List   Diagnosis Date Noted   Abnormal uterine bleeding 11/03/2023   Postmenopausal bleeding 11/03/2023   Hyperaldosteronism (HCC) 06/18/2023   Dyslipidemia, goal LDL below 100 06/17/2023   Malignant hypertension 06/11/2023   Need for immunization against influenza 06/11/2023   Encounter for general adult medical examination with abnormal findings 11/07/2022   Primary osteoarthritis involving multiple joints 12/26/2021   Chronic bilateral low back pain without sciatica 12/26/2021   Pre-diabetes    Status post bariatric surgery 06/16/2019   Allergic rhinitis 01/12/2013   B12 deficiency anemia 01/09/2012    ROS and patient/family/surgical history, located on admission H&P note dated 11/03/2023, have been reviewed, and there are no changes except as noted below Yesterday/Overnight Events:  Underwent scheduled RA-TLH,BSO, cysto  Subjective:  Doing well this AM. Passing flatus. Voiding without issue. Pain is moderately well controlled. Has tolerated po without issue and ambulating.   Objective:   Vitals:   11/03/23 1916 11/03/23 2140 11/04/23 0159 11/04/23 0530  BP: 115/66 118/66 128/73 (!) 149/76  Pulse: 88 79 81 67  Resp: 18 20 12 14   Temp: 97.6 F (36.4 C) 97.8 F (36.6 C) 98 F (36.7 C) (!) 97.5 F (36.4 C)  TempSrc:  Oral Oral Oral  SpO2: 98% 97% 99% 98%  Weight:      Height:        Temp:  [97.5 F (36.4 C)-98.5 F (36.9 C)] 97.5 F (36.4 C) (04/02 0530) Pulse Rate:  [65-88] 67 (04/02 0530) Resp:  [10-25] 14 (04/02 0530) BP: (115-167)/(66-105) 149/76 (04/02 0530) SpO2:  [90 %-100 %] 98 % (04/02 0530) Weight:  [88.5 kg] 88.5 kg (04/01 1028) I/O last 3 completed shifts: In: 1420 [P.O.:420; I.V.:900; IV Piggyback:100] Out: 745 [Urine:725; Blood:20] No  intake/output data recorded.  Intake/Output Summary (Last 24 hours) at 11/04/2023 0820 Last data filed at 11/04/2023 0520 Gross per 24 hour  Intake 1420 ml  Output 745 ml  Net 675 ml     Current Vital Signs 24h Vital Sign Ranges  T (!) 97.5 F (36.4 C) Temp  Avg: 98 F (36.7 C)  Min: 97.5 F (36.4 C)  Max: 98.5 F (36.9 C)  BP (!) 149/76 BP  Min: 115/66  Max: 167/86  HR 67 Pulse  Avg: 79.4  Min: 65  Max: 88  RR 14 Resp  Avg: 16.2  Min: 10  Max: 25  SaO2 98 % Room Air SpO2  Avg: 97.1 %  Min: 90 %  Max: 100 %       24 Hour I/O Current Shift I/O  Time Ins Outs 04/01 0701 - 04/02 0700 In: 1420 [P.O.:420; I.V.:900] Out: 745 [Urine:725] No intake/output data recorded.   Patient Vitals for the past 12 hrs:  BP Temp Temp src Pulse Resp SpO2  11/04/23 0530 (!) 149/76 (!) 97.5 F (36.4 C) Oral 67 14 98 %  11/04/23 0159 128/73 98 F (36.7 C) Oral 81 12 99 %  11/03/23 2140 118/66 97.8 F (36.6 C) Oral 79 20 97 %     Patient Vitals for the past 24 hrs:  BP Temp Temp src Pulse Resp SpO2 Height Weight  11/04/23 0530 (!) 149/76 (!) 97.5 F (36.4 C) Oral 67 14 98 % -- --  11/04/23 0159 128/73 98 F (36.7 C) Oral 81 12  99 % -- --  11/03/23 2140 118/66 97.8 F (36.6 C) Oral 79 20 97 % -- --  11/03/23 1916 115/66 97.6 F (36.4 C) -- 88 18 98 % -- --  11/03/23 1800 129/80 -- -- 88 18 95 % -- --  11/03/23 1700 132/80 98.3 F (36.8 C) -- 78 18 98 % -- --  11/03/23 1630 118/86 98.1 F (36.7 C) -- 80 18 98 % -- --  11/03/23 1600 -- 98 F (36.7 C) -- -- -- -- -- --  11/03/23 1545 128/88 -- -- 79 18 97 % -- --  11/03/23 1530 (!) 140/82 -- -- 80 10 96 % -- --  11/03/23 1515 (!) 148/84 -- -- 80 15 96 % -- --  11/03/23 1500 (!) 141/82 -- -- 77 11 97 % -- --  11/03/23 1458 (!) 141/82 -- -- 82 16 99 % -- --  11/03/23 1445 (!) 145/78 -- -- 78 14 90 % -- --  11/03/23 1430 (!) 146/88 -- -- 81 (!) 25 95 % -- --  11/03/23 1415 (!) 153/105 -- -- 83 15 100 % -- --  11/03/23 1400 (!) 164/84 97.8  F (36.6 C) -- 84 17 100 % -- --  11/03/23 1356 (!) 167/86 -- -- -- -- -- -- --  11/03/23 1028 (!) 137/97 98.5 F (36.9 C) Oral 65 17 98 % 5\' 9"  (1.753 m) 88.5 kg    Physical exam: General appearance: alert, cooperative, and appears stated age Abdomen:  soft, appropriately tender Lungs: clear to auscultation bilaterally Heart: S1, S2 normal, no murmur, rub or gallop, regular rate and rhythm Extremities: no lower extremity edema Skin: intact, incision clean/dry/intact with dermabond Psych: appropriate Neurologic: Grossly normal  Medications Current Facility-Administered Medications  Medication Dose Route Frequency Provider Last Rate Last Admin   0.9 %  sodium chloride infusion   Intravenous Continuous Merilee Wible, MD       acetaminophen (TYLENOL) tablet 1,000 mg  1,000 mg Oral Q6H Nakyla Bracco, MD   1,000 mg at 11/04/23 0804   escitalopram (LEXAPRO) tablet 20 mg  20 mg Oral Daily Normalee Sistare, MD       HYDROmorphone (DILAUDID) injection 0.2-0.6 mg  0.2-0.6 mg Intravenous Q2H PRN Kensington Rios, MD   0.6 mg at 11/04/23 0409   nebivolol (BYSTOLIC) tablet 10 mg  10 mg Oral Daily Anaily Ashbaugh, MD       ondansetron (ZOFRAN) tablet 4 mg  4 mg Oral Q6H PRN Irie Fiorello, MD       Or   ondansetron (ZOFRAN) injection 4 mg  4 mg Intravenous Q6H PRN Armistead Sult, MD       oxyCODONE (Oxy IR/ROXICODONE) immediate release tablet 5-10 mg  5-10 mg Oral Q4H PRN Merrill Deanda, MD   10 mg at 11/04/23 0804   polyethylene glycol (MIRALAX / GLYCOLAX) packet 17 g  17 g Oral Daily PRN Lorriane Shire, MD       simethicone (MYLICON) chewable tablet 80 mg  80 mg Oral QID PRN Shacarra Choe, MD       spironolactone (ALDACTONE) tablet 100 mg  100 mg Oral Daily Camillia Marcy, MD   100 mg at 11/03/23 1700      Labs  Recent Labs  Lab 10/29/23 0915  WBC 3.8*  HGB 12.9  HCT 40.6  PLT 220    Recent Labs  Lab 10/29/23 0915  NA 141  K 4.3  CL 107   CO2 25  BUN 17  CREATININE 0.85  CALCIUM 9.5  GLUCOSE 92    Assessment & Plan:  Doing well and meeting postop goals *GYN: follow up surgical pathology, reviewed postop restrictions *Cardio: recommend continuation of anti-hypertenstives *Pain: APAP and oxycodone prn  *FEN/GI: heart healthy diet, SLIV *PPx: IS, SCDs *Dispo: home this AM  Code Status: Full Code   Lorriane Shire, MD Minimally Invasive Gynecologic [facility] for [facility] (Faculty Practice) 11/04/23  8:20 AM pager [phone] cell phone [phone]

## 2023-11-04 NOTE — Discharge Summary (Signed)
 Gynecology Physician Postoperative Discharge Summary  Patient ID: Mary Sanford MRN: [id number] DOB/AGE: 09-20-1965 58 y.o.  Admit Date: 11/03/2023 Discharge Date: 11/04/2023  Preoperative Diagnoses: abnormal uterine bleeding  Procedures: Procedure(s) (LRB): XI ROBOTIC ASSISTED LAPAROSCOPIC HYSTERECTOMY AND SALPINGO-OOPHERECTOMY (Bilateral) CYSTOSCOPY (N/A)  Hospital Course:  Mary Sanford is a 58 y.o. [id number]  admitted for scheduled surgery.  She underwent the procedures as mentioned above, her operation was uncomplicated. For further details about surgery, please refer to the operative report. Patient had an uncomplicated postoperative course. By time of discharge on POD#1, her pain was controlled on oral pain medications; she was ambulating, voiding without difficulty, tolerating regular diet and passing flatus. She was deemed stable for discharge to home.   Significant Labs:    Latest Ref Rng & Units 10/29/2023    9:15 AM 09/22/2023    8:42 AM 09/15/2023   12:17 PM  CBC  WBC 4.0 - 10.5 K/uL 3.8  4.3  5.7   Hemoglobin 12.0 - 15.0 g/dL 13.0  86.5  78.4   Hematocrit 36.0 - 46.0 % 40.6  41.0  42.6   Platelets 150 - 400 K/uL 220  254  258     Discharge Exam: Blood pressure (!) 149/76, pulse 67, temperature (!) 97.5 F (36.4 C), temperature source Oral, resp. rate 14, height 5\' 9"  (1.753 m), weight 88.5 kg, last menstrual period 05/15/2021, SpO2 98%. General appearance: alert and no distress  Resp: clear to auscultation bilaterally  Cardio: regular rate and rhythm  GI: soft, non-tender; bowel sounds normal; no masses, no organomegaly.  Incision: C/D/I, no erythema, no drainage noted Pelvic: scant blood on pad (done in presence of RN as chaperone)  Extremities: extremities normal, atraumatic, no cyanosis or edema and Homans sign is negative, no sign of DVT  Discharged Condition: Stable  Disposition: Discharge disposition: 01-Home or Self Care       Discharge Instructions      meds to beds pharmacy consult ([facility] ONLY)   Complete by: As directed       Allergies as of 11/04/2023       Reactions   Naproxen Other (See Comments)   Panic attacks No Problem with Motrin        Medication List     PAUSE taking these medications    Advil Dual Action 125-250 MG Tabs Wait to take this until your doctor or other care provider tells you to start again. Generic drug: Ibuprofen-Acetaminophen Take 1 tablet by mouth every 8 (eight) hours as needed (pain).       TAKE these medications    Acetaminophen Extra Strength 500 MG Tabs Take 1-2 tablets (500-1,000 mg total) by mouth as needed for headache or moderate pain (pain score 4-6). Notes to patient: May take next dose at 2:00 pm   amLODipine 5 MG tablet Commonly known as: NORVASC Take 2 tablets (10 mg total) by mouth daily.   atorvastatin 10 MG tablet Commonly known as: LIPITOR Take 1 tablet (10 mg total) by mouth daily.   BEET ROOT PO Take 2 each by mouth daily. Super Root chew x2 in am   escitalopram 20 MG tablet Commonly known as: LEXAPRO Take 1 tablet by mouth once daily   ICY HOT EX Apply 1 Application topically daily as needed (pain).   lidocaine 5 % Commonly known as: Lidoderm Place 1 patch onto the skin daily. Remove & Discard patch within 12 hours or as directed by MD What changed:  when to take this reasons  to take this   nebivolol 10 MG tablet Commonly known as: BYSTOLIC Take 1 tablet by mouth once daily   oxyCODONE 5 MG immediate release tablet Commonly known as: Oxy IR/ROXICODONE Take 1 tablet (5 mg total) by mouth every 4 (four) hours as needed for severe pain (pain score 7-10) or breakthrough pain. Notes to patient: May take next dose at 12:00 noon   spironolactone 100 MG tablet Commonly known as: Aldactone Take 1 tablet (100 mg total) by mouth daily.   tiZANidine 2 MG tablet Commonly known as: ZANAFLEX Take 1-2 tablets (2-4 mg total) by mouth every 8 (eight)  hours as needed for muscle spasms. What changed: how much to take   VISION CLEAR OP Place into both eyes as needed (for contacts).       Future Appointments  Date Time Provider Department Center  12/01/2023  8:35 AM Lorriane Shire, MD [facility]  12/24/2023  2:20 PM Thapa, [place], MD [facility] None  12/30/2023  9:15 AM Lorriane Shire, MD [facility]     Total discharge time: 15 minutes   Signed:  Lorriane Shire, MD Minimally Invasive Gynecologic Surgery and Chronic Pelvic Pain Specialist Obstetrics and Gynecology, [facility] for [facility], [facility] 11/04/23

## 2023-11-04 NOTE — Anesthesia Postprocedure Evaluation (Signed)
 Anesthesia Post Note  Patient: Mary Sanford  Procedure(s) Performed: XI ROBOTIC ASSISTED LAPAROSCOPIC HYSTERECTOMY AND SALPINGO-OOPHERECTOMY (Bilateral: Pelvis) CYSTOSCOPY (Bladder)     Patient location during evaluation: PACU Anesthesia Type: General Level of consciousness: awake and alert Pain management: pain level controlled Vital Signs Assessment: post-procedure vital signs reviewed and stable Respiratory status: spontaneous breathing, nonlabored ventilation and respiratory function stable Cardiovascular status: blood pressure returned to baseline and stable Postop Assessment: no apparent nausea or vomiting Anesthetic complications: no   There were no known notable events for this encounter.  Last Vitals:    Last Pain:                 Collene Schlichter

## 2023-11-06 ENCOUNTER — Encounter: Payer: Self-pay | Admitting: Obstetrics and Gynecology

## 2023-11-06 LAB — SURGICAL PATHOLOGY

## 2023-12-01 ENCOUNTER — Other Ambulatory Visit: Payer: Self-pay

## 2023-12-01 ENCOUNTER — Ambulatory Visit: Admitting: Obstetrics and Gynecology

## 2023-12-01 ENCOUNTER — Encounter: Payer: Self-pay | Admitting: Obstetrics and Gynecology

## 2023-12-01 VITALS — BP 138/88 | HR 64 | Wt 195.6 lb

## 2023-12-01 DIAGNOSIS — G8918 Other acute postprocedural pain: Secondary | ICD-10-CM

## 2023-12-01 MED ORDER — GABAPENTIN 100 MG PO CAPS: 100.0000 mg | ORAL_CAPSULE | Freq: Three times a day (TID) | ORAL | 1 refills | Status: DC

## 2023-12-01 NOTE — Progress Notes (Signed)
   POSTOPERATIVE VISIT NOTE   Subjective:     Mary Sanford is a 58 y.o. [id number] who presents to the clinic 4 weeks status post  RA-TLH, BS, cysto  for abnormal uterine bleeding. Eating a regular diet without difficulty. Bowel movements are normal.  Currently taking gabapentin  Incision: a little bit of darkening  Vaginal bleeding: chronic spotting since surgery Resumed sexual acitivity: no  The following portions of the patient's history were reviewed and updated as appropriate: allergies, current medications, past family history, past medical history, past social history, past surgical history, and problem list..   Review of Systems Pertinent items are noted in HPI.    Objective:    BP (!) 143/83   Pulse (!) 59   Wt 195 lb 9.6 oz (88.7 kg)   LMP 05/15/2021 (Approximate)   BMI 28.89 kg/m  General:  alert, cooperative, and no distress  Abdomen: soft, bowel sounds active, non-tender  Incision:   healing well, no drainage, no erythema, no hernia, no seroma, no swelling, no dehiscence, incision well approximated  Pelvic:   Exam deferred.    Pathology Results: FINAL MICROSCOPIC DIAGNOSIS:   A. UTERUS WITH RIGHT AND LEFT FALLOPIAN TUBE AND OVARY, HYSTERECTOMY AND  BILATERAL SALPINGO-OOPHORECTOMY:  Benign leiomyoma, intramural, measuring 1.4 cm in greatest dimension  Focal superficial adenomyosis  Benign inactive to atrophic endometrium  Mild chronic cervicitis  Benign simple cyst of right ovary  Benign left ovary and fallopian tubes    Assessment:   Doing well postoperatively. Operative findings again reviewed. Pathology report discussed.   Plan:    There are no diagnoses linked to this encounter.  Activity restrictions: no lifting more than 10 pounds and pelvic rest Anticipated return to work:  after 8 week visit  . Letter provided Follow up: at 8 weeks   Kiki Pelton, MD Obstetrician & Gynecologist, [facility] for [employer], [facility]

## 2023-12-15 ENCOUNTER — Other Ambulatory Visit: Payer: Self-pay | Admitting: Internal Medicine

## 2023-12-15 DIAGNOSIS — F418 Other specified anxiety disorders: Secondary | ICD-10-CM

## 2023-12-24 ENCOUNTER — Other Ambulatory Visit: Payer: Self-pay | Admitting: Internal Medicine

## 2023-12-24 ENCOUNTER — Ambulatory Visit: Admitting: Endocrinology

## 2023-12-24 DIAGNOSIS — I10 Essential (primary) hypertension: Secondary | ICD-10-CM

## 2023-12-30 ENCOUNTER — Other Ambulatory Visit: Payer: Self-pay

## 2023-12-30 ENCOUNTER — Ambulatory Visit: Admitting: Obstetrics and Gynecology

## 2023-12-30 ENCOUNTER — Encounter: Payer: Self-pay | Admitting: Obstetrics and Gynecology

## 2023-12-30 VITALS — BP 158/95 | HR 74

## 2023-12-30 DIAGNOSIS — Z1331 Encounter for screening for depression: Secondary | ICD-10-CM | POA: Diagnosis not present

## 2023-12-30 DIAGNOSIS — G8918 Other acute postprocedural pain: Secondary | ICD-10-CM

## 2023-12-30 DIAGNOSIS — N951 Menopausal and female climacteric states: Secondary | ICD-10-CM

## 2023-12-30 DIAGNOSIS — Z09 Encounter for follow-up examination after completed treatment for conditions other than malignant neoplasm: Secondary | ICD-10-CM

## 2023-12-30 DIAGNOSIS — N941 Unspecified dyspareunia: Secondary | ICD-10-CM

## 2023-12-30 MED ORDER — GABAPENTIN 100 MG PO CAPS: 100.0000 mg | ORAL_CAPSULE | Freq: Every day | ORAL | 11 refills | Status: DC

## 2023-12-30 MED ORDER — ESTRADIOL 0.1 MG/GM VA CREA: TOPICAL_CREAM | VAGINAL | 12 refills | Status: DC

## 2023-12-30 NOTE — Progress Notes (Signed)
   POSTOPERATIVE VISIT NOTE   Subjective:     Mary Sanford is a 58 y.o. [id number] who presents to the clinic 8 weeks status post RA-TLH, BS, cysto for abnormal uterine bleeding. Eating a regular diet without difficulty.  Incision: no issues Vaginal bleeding: 2-3 weeks ago, none since Resumed sexual acitivity: none  Never picked up the gabapentin  and having significant hot flashes   The following portions of the patient's history were reviewed and updated as appropriate: allergies, current medications, past family history, past medical history, past social history, past surgical history, and problem list..   Review of Systems Pertinent items are noted in HPI.    Objective:    BP (!) 158/95   Pulse 74   LMP 05/15/2021 (Approximate)  General:  alert, cooperative, and no distress  Abdomen: soft, non-tender  Incision:   healing well, no drainage, no erythema, no hernia, no seroma, no swelling, no dehiscence, incision well approximated  Pelvic:   Normal external genitali, well approximated vaginal cuff with mild erythema at the cuff that's nontender to palpation, intact and nonender on digital palpation but notes having cramping after examintion      Pathology Results: FINAL MICROSCOPIC DIAGNOSIS:   A. UTERUS WITH RIGHT AND LEFT FALLOPIAN TUBE AND OVARY, HYSTERECTOMY AND  BILATERAL SALPINGO-OOPHORECTOMY:  Benign leiomyoma, intramural, measuring 1.4 cm in greatest dimension  Focal superficial adenomyosis  Benign inactive to atrophic endometrium  Mild chronic cervicitis  Benign simple cyst of right ovary  Benign left ovary and fallopian tube    Assessment:   Doing well postoperatively. Operative findings again reviewed. Pathology report discussed.   Plan:    1. Postop check (Primary) Doing well   2. Postoperative pain - gabapentin  (NEURONTIN ) 100 MG capsule; Take 1-3 capsules (100-300 mg total) by mouth at bedtime.  Dispense: 60 capsule; Refill: 11 - estradiol  (ESTRACE ) 0.1  MG/GM vaginal cream; Apply 1 gram per vagina every night for 2 weeks, then apply three times a week  Dispense: 30 g; Refill: 12  3. Dyspareunia in female Vaginal estrogen to promote vaginal cuff healing. Pelvic rest additional 4-6 weeks.  - estradiol  (ESTRACE ) 0.1 MG/GM vaginal cream; Apply 1 gram per vagina every night for 2 weeks, then apply three times a week  Dispense: 30 g; Refill: 12  4. Vasomotor symptoms due to menopause Noted that gabapentin  can be used for hot flashes as well.    Activity restrictions: pelvic rest Anticipated return to work: now. Follow up: 4-6 weeks  Kiki Pelton, MD Obstetrician & Gynecologist, [facility] for [employer], [facility]

## 2023-12-30 NOTE — Patient Instructions (Signed)
 No intercourse for at least 4 - 6 weeks  Ok to return to other activities

## 2024-01-01 ENCOUNTER — Other Ambulatory Visit: Payer: Self-pay | Admitting: Internal Medicine

## 2024-01-01 DIAGNOSIS — E785 Hyperlipidemia, unspecified: Secondary | ICD-10-CM

## 2024-01-14 ENCOUNTER — Encounter (HOSPITAL_COMMUNITY): Payer: Self-pay | Admitting: *Deleted

## 2024-01-21 ENCOUNTER — Emergency Department (HOSPITAL_BASED_OUTPATIENT_CLINIC_OR_DEPARTMENT_OTHER)
Admission: EM | Admit: 2024-01-21 | Discharge: 2024-01-21 | Disposition: A | Discharge status: Home / Self Care | Attending: Emergency Medicine | Admitting: Emergency Medicine

## 2024-01-21 ENCOUNTER — Other Ambulatory Visit: Payer: Self-pay

## 2024-01-21 ENCOUNTER — Encounter (HOSPITAL_BASED_OUTPATIENT_CLINIC_OR_DEPARTMENT_OTHER): Payer: Self-pay

## 2024-01-21 ENCOUNTER — Other Ambulatory Visit: Payer: Self-pay | Admitting: Internal Medicine

## 2024-01-21 DIAGNOSIS — Z87442 Personal history of urinary calculi: Secondary | ICD-10-CM | POA: Insufficient documentation

## 2024-01-21 DIAGNOSIS — B9689 Other specified bacterial agents as the cause of diseases classified elsewhere: Secondary | ICD-10-CM

## 2024-01-21 DIAGNOSIS — Z79899 Other long term (current) drug therapy: Secondary | ICD-10-CM | POA: Diagnosis not present

## 2024-01-21 DIAGNOSIS — I10 Essential (primary) hypertension: Secondary | ICD-10-CM | POA: Insufficient documentation

## 2024-01-21 DIAGNOSIS — N760 Acute vaginitis: Secondary | ICD-10-CM | POA: Diagnosis not present

## 2024-01-21 DIAGNOSIS — Z87891 Personal history of nicotine dependence: Secondary | ICD-10-CM | POA: Insufficient documentation

## 2024-01-21 DIAGNOSIS — N898 Other specified noninflammatory disorders of vagina: Secondary | ICD-10-CM | POA: Diagnosis not present

## 2024-01-21 DIAGNOSIS — A5901 Trichomonal vulvovaginitis: Secondary | ICD-10-CM | POA: Diagnosis not present

## 2024-01-21 DIAGNOSIS — A599 Trichomoniasis, unspecified: Secondary | ICD-10-CM | POA: Diagnosis not present

## 2024-01-21 LAB — WET PREP, GENITAL
Sperm: NONE SEEN
WBC, Wet Prep HPF POC: >=10 — AB (ref <10)
Yeast Wet Prep HPF POC: NONE SEEN

## 2024-01-21 MED ORDER — METRONIDAZOLE 500 MG PO TABS: 500.0000 mg | ORAL_TABLET | Freq: Two times a day (BID) | ORAL | 0 refills | Status: DC

## 2024-01-21 NOTE — ED Triage Notes (Signed)
 She ius here with c/o painless and odorless serosanguinous vaginal d/c for a couple of days. She mentions having a complete hysterectomy in April of this year. She denies fever/n/v/d/dysuria.

## 2024-01-21 NOTE — ED Provider Notes (Signed)
 [place] EMERGENCY DEPARTMENT AT [facility] Provider Note  CSN: [id number] Arrival date & time: 01/21/24 9261  Chief Complaint(s) Post-op Problem  HPI Mary Sanford is a 58 y.o. female with PMHx prediabetes, HTN, HLD, s/p TAH presenting with vaginal discharge x couple days.  Reports she had some vaginal irritation a couple days ago and then noted to have some discharge that was clear/white over the past day.  Reports she was told by her OB/GYN if she had any vaginal discharge or bleeding at all to go to the ED so that is why she came to be seen today.  Denies any vaginal bleeding and pelvic or abdominal pain.  Denies any fever, N/V.  Denies dysuria.  States she did have intercourse with lubricant prior to that time as well.  Past Medical History Past Medical History:  Diagnosis Date   Arthritis    hands, left knee   Chronic low back pain    Depression    Dyslipidemia    Family history of adverse reaction to anesthesia    one sister ponv and slow to awaken   GAD (generalized anxiety disorder)    History of kidney stones    Hyperaldosteronism (HCC)    followed by pcp   (abd MRI in epic 10-13-2023 normal)   Iron deficiency anemia    Malignant hypertension    ED visit in epic 09-22-2023 HTN urgency, bp 165/101;   previously sent to GYN office 09-15-2023 for bp 193/ 115   Panic disorder    PMB (postmenopausal bleeding)    Pre-diabetes    followed by pcp   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 tanda   Seasonal allergies    SUI (stress urinary incontinence, female)    Uterine fibroid    Wears contact lenses    Wears glasses    Patient Active Problem List   Diagnosis Date Noted   Abnormal uterine bleeding 11/03/2023   Vaginal bleeding 07/28/2023   Hyperaldosteronism (HCC) 06/18/2023   Dyslipidemia, goal LDL below 100 06/17/2023   Malignant hypertension 06/11/2023   Need for immunization against influenza 06/11/2023   Encounter for  general adult medical examination with abnormal findings 11/07/2022   Primary osteoarthritis involving multiple joints 12/26/2021   Chronic bilateral low back pain without sciatica 12/26/2021   Pre-diabetes    Status post bariatric surgery 06/16/2019   Allergic rhinitis 01/12/2013   B12 deficiency anemia 01/09/2012   Home Medication(s) Prior to Admission medications   Medication Sig Start Date End Date Taking? Authorizing Provider  metroNIDAZOLE  (FLAGYL ) 500 MG tablet Take 1 tablet (500 mg total) by mouth 2 (two) times daily. 01/21/24  Yes Theophilus Pagan, MD  spironolactone  (ALDACTONE ) 50 MG tablet Take 50 mg by mouth daily. 01/01/24  Yes [provider]  acetaminophen  (TYLENOL ) 500 MG tablet Take 1-2 tablets (500-1,000 mg total) by mouth as needed for headache or moderate pain (pain score 4-6). 11/03/23   Ajewole, Christana, MD  amLODipine  (NORVASC ) 5 MG tablet Take 2 tablets (10 mg total) by mouth daily. Patient taking differently: Take 2 tablets (10 mg total) by mouth daily. 09/15/23   Kingsley, Victoria K, DO  atorvastatin  (LIPITOR) 10 MG tablet Take 1 tablet by mouth once daily 01/01/24   Joshua Debby CROME, MD  escitalopram  (LEXAPRO ) 20 MG tablet Take 1 tablet by mouth once daily 12/16/23   Joshua Debby CROME, MD  estradiol  (ESTRACE ) 0.1 MG/GM vaginal cream Apply 1 gram per vagina every night for  2 weeks, then apply three times a week 12/30/23   Ajewole, Christana, MD  gabapentin  (NEURONTIN ) 100 MG capsule Take 1-3 capsules (100-300 mg total) by mouth at bedtime. 12/30/23   Ajewole, Christana, MD  Ibuprofen -Acetaminophen  (ADVIL  DUAL ACTION) 125-250 MG TABS Take 1 tablet by mouth every 8 (eight) hours as needed (pain).    [provider]  lidocaine  (LIDODERM ) 5 % Place 1 patch onto the skin daily. Remove & Discard patch within 12 hours or as directed by MD Patient not taking: Reported on 12/30/2023 09/03/23   Geiple, Joshua, PA-C  Menthol, Topical Analgesic, (ICY HOT EX) Apply 1  Application topically daily as needed (pain). Patient not taking: Reported on 12/01/2023    [provider]  nebivolol  (BYSTOLIC ) 10 MG tablet Take 1 tablet by mouth once daily 12/24/23   Joshua Debby CROME, MD  oxyCODONE  (OXY IR/ROXICODONE ) 5 MG immediate release tablet Take 1 tablet (5 mg total) by mouth every 4 (four) hours as needed for severe pain (pain score 7-10) or breakthrough pain. Patient not taking: Reported on 12/30/2023 11/03/23   Ajewole, Christana, MD  spironolactone  (ALDACTONE ) 100 MG tablet Take 1 tablet (100 mg total) by mouth daily. Patient taking differently: Take 100 mg by mouth daily. 09/08/23   Joshua Debby CROME, MD  Tetrahydrozoline HCl (VISION CLEAR OP) Place into both eyes as needed (for contacts).    [provider]  tiZANidine  (ZANAFLEX ) 2 MG tablet Take 1-2 tablets (2-4 mg total) by mouth every 8 (eight) hours as needed for muscle spasms. Patient not taking: Reported on 12/30/2023 06/19/23   Joshua Debby CROME, MD                                                                                                                                    Past Surgical History Past Surgical History:  Procedure Laterality Date   ABDOMINAL HYSTERECTOMY     BUNIONECTOMY Bilateral    CESAREAN SECTION  1989   COLONOSCOPY WITH PROPOFOL   11/05/2020   dr glo   CYSTOSCOPY N/A 11/03/2023   Procedure: CYSTOSCOPY;  Surgeon: Jeralyn Crutch, MD;  Location: [facility] OR;  Service: Gynecology;  Laterality: N/A;   CYSTOSCOPY W/ RETROGRADES Left 06/28/2018   Procedure: CYSTOSCOPY WITH RETROGRADE PYELOGRAM LEFT STENT;  Surgeon: Devere Lonni Righter, MD;  Location: [facility];  Service: Urology;  Laterality: Left;   CYSTOSCOPY/URETEROSCOPY/HOLMIUM LASER/STENT PLACEMENT Left 07/16/2018   Procedure: CYSTOSCOPY/URETEROSCOPY/HOLMIUM LASER/STENT EXCHANGE;  Surgeon: Devere Lonni Righter, MD;  Location: [facility];  Service: Urology;  Laterality: Left;   DILATATION &  CURETTAGE/HYSTEROSCOPY WITH MYOSURE N/A 12/13/2019   Procedure: DILATATION & CURETTAGE/HYSTEROSCOPY WITH MYOSURE;  Surgeon: Jannis Kate Norris, MD;  Location: [facility] [place];  Service: Gynecology;  Laterality: N/A;   DILITATION & CURRETTAGE/HYSTROSCOPY WITH NOVASURE ABLATION N/A 06/17/2013   Procedure: DILATATION & CURETTAGE/HYSTEROSCOPY WITH NOVASURE ABLATION;  Surgeon: Gloris DELENA Hugger, MD;  Location: [facility];  Service:  Gynecology;  Laterality: N/A;   ESOPHAGOGASTRODUODENOSCOPY (EGD) WITH PROPOFOL   10/11/2020   dr glo   FINGER SURGERY  04/2021   thumb   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   Procedure: LAPAROSCOPIC ROUX-EN-Y GASTRIC BYPASS WITH UPPER ENDOSCOPY;  Surgeon: Tanda Locus, MD;  Location: [facility];  Service: General;  Laterality: N/A;   HEMORRHOIDECTOMY WITH HEMORRHOID BANDING  10/19/2012   SCA THD Hem ligation/pexy   LAPAROSCOPIC TUBAL LIGATION Bilateral 2001   OPERATIVE ULTRASOUND N/A 12/13/2019   Procedure: OPERATIVE ULTRASOUND;  Surgeon: Jannis Kate Norris, MD;  Location: [facility] [place];  Service: Gynecology;  Laterality: N/A;   ROBOTIC ASSISTED LAPAROSCOPIC HYSTERECTOMY AND SALPINGECTOMY Bilateral 11/03/2023   Procedure: XI ROBOTIC ASSISTED LAPAROSCOPIC HYSTERECTOMY AND SALPINGO-OOPHERECTOMY;  Surgeon: Jeralyn Crutch, MD;  Location: [facility];  Service: Gynecology;  Laterality: Bilateral;   WISDOM TOOTH EXTRACTION     Family History Family History  Problem Relation Age of Onset   Hypertension Mother    Hypothyroidism Mother    Rectal cancer Mother 72   Colon cancer Mother 60   Colon polyps Mother    Hypotension Sister    Hypothyroidism Sister    Diabetes Sister    Other Sister        pacemaker   Seizures Brother    Hypothyroidism Sister    Seizures Sister    Anemia Sister    Deep vein thrombosis Sister    Seizures Paternal Grandfather    Stroke Maternal Uncle        > 55   Diabetes Maternal Grandmother    Kidney failure Maternal  Grandmother    Heart attack Maternal Grandfather    Other Paternal Grandmother        tumors throughout body   Hypertension Maternal Uncle    Esophageal cancer Neg Hx    Stomach cancer Neg Hx    Heart disease Neg Hx     Social History Social History   Tobacco Use   Smoking status: Former    Types: Cigarettes   Smokeless tobacco: Never   Tobacco comments:    10-27-2023  pt stated quit in 2000/  started age 68 (36)  for 16 yrs  Vaping Use   Vaping status: Never Used  Substance Use Topics   Alcohol use: Yes    Comment: per pt could 1-2 bottles per week   Drug use: Not Currently    Types: Marijuana    Comment: last used 1990s   Allergies Naproxen   Review of Systems A thorough review of systems was obtained and all systems are negative except as noted in the HPI and PMH.   Physical Exam Vital Signs  I have reviewed the triage vital signs BP (!) 146/94 (BP Location: Right Arm)   Pulse 66   Temp 98.5 F (36.9 C) (Oral)   Resp 16   LMP 05/15/2021 (Approximate)   SpO2 99%  Physical Exam  General: Well-appearing, alert, NAD Eyes: PERRLA, anicteric sclera ENTM: Moist mucus membranes. Neck: Supple, non-tender Cardiovascular: RRR without murmur Respiratory: CTAB. Normal WOB on RA Gastrointestinal: Soft, non-tender, non-distended Pelvic exam: VAGINA: normal appearing vagina with normal color and discharge, no lesions, vaginal discharge - white, copious, and creamy.  Visualized vaginal cuff, 1 area with minimal blood and appeared hemostatic.  After swabbing around the area some small amount of bleeding from the site noted.  No dehiscence or purulent drainage from the area. MSK: No peripheral edema Derm: Warm, dry, no rashes noted Neuro: Motor and sensation intact globally  Psych: Cooperative, pleasant   ED Results and Treatments Labs (all labs ordered are listed, but only abnormal results are displayed) Labs Reviewed  WET PREP, GENITAL - Abnormal; Notable for the  following components:      Result Value   Trich, Wet Prep PRESENT (*)    Clue Cells Wet Prep HPF POC PRESENT (*)    WBC, Wet Prep HPF POC >=10 (*)    All other components within normal limits  URINALYSIS, ROUTINE W REFLEX MICROSCOPIC  GC/CHLAMYDIA PROBE AMP ([place]) NOT AT [facility]                                                                                                                          Radiology No results found.  Pertinent labs & imaging results that were available during my care of the patient were reviewed by me and considered in my medical decision making (see MDM for details).  Medications Ordered in ED Medications - No data to display                                                                 Medical Decision Making / ED Course    Medical Decision Making:    Mary Sanford is a 58 y.o. female with PMHx prediabetes, HTN, HLD, s/p TAH presenting with vaginal discharge x couple days. The complaint involves an extensive differential diagnosis and also carries with it a high risk of complications and morbidity.  Serious etiology was considered. Ddx includes but is not limited to: BV, yeast, STI, vaginal cuff dehiscence  Complete initial physical exam performed, notably the patient was in no distress.    Reviewed and confirmed nursing documentation for past medical history, family history, social history.  Vital signs reviewed.     Brief summary:  58 year old female with history as above presenting with vaginal discharge in the setting of TAH in 11/2023.  Pelvic exam performed, copious vaginal discharge noted wet prep and STI testing sent.  Small area of bleeding that became hemostatic during exam along with the vaginal cuff without dehiscence or purulence.  Recommend follow-up with OB/GYN for pelvic exam to assess vaginal cuff further.   Clinical Course as of 01/21/24 1022  Thu Jan 21, 2024  1022 Wet prep positive for BV and trichomonas.  Sent in treatment  with metronidazole  500 mg twice daily x 7 days to preferred pharmacy. [KH]    Clinical Course User Index [KH] Theophilus Pagan, MD     Lab Tests: -I ordered, reviewed, and interpreted labs.   The pertinent results include:   Labs Reviewed  WET PREP, GENITAL - Abnormal; Notable for the following components:      Result Value   Trich, Wet Prep PRESENT (*)  Clue Cells Wet Prep HPF POC PRESENT (*)    WBC, Wet Prep HPF POC >=10 (*)    All other components within normal limits  URINALYSIS, ROUTINE W REFLEX MICROSCOPIC  GC/CHLAMYDIA PROBE AMP ([place]) NOT AT [facility]    Medicines ordered and prescription drug management: Meds ordered this encounter  Medications   metroNIDAZOLE  (FLAGYL ) 500 MG tablet    Sig: Take 1 tablet (500 mg total) by mouth 2 (two) times daily.    Dispense:  14 tablet    Refill:  0    -I have reviewed the patients home medicines and have made adjustments as needed   Reevaluation: After the interventions noted above, I reevaluated the patient and found that they have stayed the same  Co morbidities that complicate the patient evaluation  Past Medical History:  Diagnosis Date   Arthritis    hands, left knee   Chronic low back pain    Depression    Dyslipidemia    Family history of adverse reaction to anesthesia    one sister ponv and slow to awaken   GAD (generalized anxiety disorder)    History of kidney stones    Hyperaldosteronism (HCC)    followed by pcp   (abd MRI in epic 10-13-2023 normal)   Iron deficiency anemia    Malignant hypertension    ED visit in epic 09-22-2023 HTN urgency, bp 165/101;   previously sent to GYN office 09-15-2023 for bp 193/ 115   Panic disorder    PMB (postmenopausal bleeding)    Pre-diabetes    followed by pcp   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 tanda   Seasonal allergies    SUI (stress urinary incontinence, female)    Uterine fibroid    Wears contact lenses    Wears glasses        Dispostion: Disposition decision including need for hospitalization was considered, and patient discharged from emergency department.    Final Clinical Impression(s) / ED Diagnoses Final diagnoses:  Vaginal discharge  Trichomonas infection  BV (bacterial vaginosis)        Theophilus Pagan, MD 01/21/24 1022    Elnor Savant A, DO 01/23/24 1507

## 2024-01-21 NOTE — Discharge Instructions (Addendum)
 It was a pleasure caring for you today in the emergency department.  You were seen for vaginal discharge and we obtained swabs for vaginal infections and STIs.  You tested positive for BV and trichomonas.  I will send in treatment with metronidazole  that you will take twice per day for the next 7 days.  I do recommend abstaining from any intercourse during this time.  Also keep in mind if you drink any alcohol with the medication it can make you very nauseous so I recommend avoiding this as well.  Please recommend your partner also get tested and treated.    We will follow-up with you regarding the results of the UTI and you will see them available on MyChart as well.  On your pelvic exam there was a small area of bleeding around the vaginal cuff but it did not look infected or open but I do recommend you go back and see your OB/GYN for continued care.  Please return to the emergency department for any worsening or worrisome symptoms.

## 2024-01-22 LAB — GC/CHLAMYDIA PROBE AMP (~~LOC~~) NOT AT ARMC
Chlamydia: NEGATIVE
Comment: NEGATIVE
Comment: NORMAL
Neisseria Gonorrhea: NEGATIVE

## 2024-02-08 ENCOUNTER — Emergency Department (HOSPITAL_BASED_OUTPATIENT_CLINIC_OR_DEPARTMENT_OTHER)

## 2024-02-08 ENCOUNTER — Telehealth: Payer: Self-pay | Admitting: Obstetrics and Gynecology

## 2024-02-08 ENCOUNTER — Other Ambulatory Visit: Payer: Self-pay

## 2024-02-08 ENCOUNTER — Encounter (HOSPITAL_BASED_OUTPATIENT_CLINIC_OR_DEPARTMENT_OTHER): Payer: Self-pay | Admitting: Emergency Medicine

## 2024-02-08 ENCOUNTER — Emergency Department (HOSPITAL_BASED_OUTPATIENT_CLINIC_OR_DEPARTMENT_OTHER)
Admission: EM | Admit: 2024-02-08 | Discharge: 2024-02-08 | Disposition: A | Discharge status: Home / Self Care | Attending: Emergency Medicine | Admitting: Emergency Medicine

## 2024-02-08 DIAGNOSIS — R1031 Right lower quadrant pain: Secondary | ICD-10-CM | POA: Diagnosis not present

## 2024-02-08 DIAGNOSIS — I10 Essential (primary) hypertension: Secondary | ICD-10-CM | POA: Diagnosis not present

## 2024-02-08 DIAGNOSIS — M545 Low back pain, unspecified: Secondary | ICD-10-CM

## 2024-02-08 DIAGNOSIS — Z79899 Other long term (current) drug therapy: Secondary | ICD-10-CM | POA: Insufficient documentation

## 2024-02-08 DIAGNOSIS — N3289 Other specified disorders of bladder: Secondary | ICD-10-CM | POA: Diagnosis not present

## 2024-02-08 DIAGNOSIS — Z90710 Acquired absence of both cervix and uterus: Secondary | ICD-10-CM | POA: Diagnosis not present

## 2024-02-08 DIAGNOSIS — K429 Umbilical hernia without obstruction or gangrene: Secondary | ICD-10-CM | POA: Diagnosis not present

## 2024-02-08 LAB — CBC WITH DIFFERENTIAL/PLATELET
Abs Immature Granulocytes: 0.01 K/uL (ref 0.00–0.07)
Basophils Absolute: 0.1 K/uL (ref 0.0–0.1)
Basophils Relative: 1 %
Eosinophils Absolute: 0.2 K/uL (ref 0.0–0.5)
Eosinophils Relative: 4 %
HCT: 41.2 % (ref 36.0–46.0)
Hemoglobin: 13.3 g/dL (ref 12.0–15.0)
Immature Granulocytes: 0 %
Lymphocytes Relative: 40 %
Lymphs Abs: 1.9 K/uL (ref 0.7–4.0)
MCH: 24.1 pg — ABNORMAL LOW (ref 26.0–34.0)
MCHC: 32.3 g/dL (ref 30.0–36.0)
MCV: 74.5 fL — ABNORMAL LOW (ref 80.0–100.0)
Monocytes Absolute: 0.6 K/uL (ref 0.1–1.0)
Monocytes Relative: 12 %
Neutro Abs: 2 K/uL (ref 1.7–7.7)
Neutrophils Relative %: 43 %
Platelets: 249 K/uL (ref 150–400)
RBC: 5.53 MIL/uL — ABNORMAL HIGH (ref 3.87–5.11)
RDW: 14.1 % (ref 11.5–15.5)
WBC: 4.7 K/uL (ref 4.0–10.5)
nRBC: 0 % (ref 0.0–0.2)

## 2024-02-08 LAB — URINALYSIS, ROUTINE W REFLEX MICROSCOPIC
Bilirubin Urine: NEGATIVE
Glucose, UA: NEGATIVE mg/dL
Ketones, ur: NEGATIVE mg/dL
Leukocytes,Ua: NEGATIVE
Nitrite: NEGATIVE
Protein, ur: NEGATIVE mg/dL
Specific Gravity, Urine: 1.016 (ref 1.005–1.030)
pH: 6 (ref 5.0–8.0)

## 2024-02-08 LAB — COMPREHENSIVE METABOLIC PANEL WITH GFR
ALT: 45 U/L — ABNORMAL HIGH (ref 0–44)
AST: 41 U/L (ref 15–41)
Albumin: 4.1 g/dL (ref 3.5–5.0)
Alkaline Phosphatase: 72 U/L (ref 38–126)
Anion gap: 10 (ref 5–15)
BUN: 15 mg/dL (ref 6–20)
CO2: 25 mmol/L (ref 22–32)
Calcium: 9.7 mg/dL (ref 8.9–10.3)
Chloride: 104 mmol/L (ref 98–111)
Creatinine, Ser: 0.64 mg/dL (ref 0.44–1.00)
GFR, Estimated: >60 mL/min (ref >60)
Glucose, Bld: 101 mg/dL — ABNORMAL HIGH (ref 70–99)
Potassium: 4.6 mmol/L (ref 3.5–5.1)
Sodium: 139 mmol/L (ref 135–145)
Total Bilirubin: 0.3 mg/dL (ref 0.0–1.2)
Total Protein: 6.8 g/dL (ref 6.5–8.1)

## 2024-02-08 LAB — LIPASE, BLOOD: Lipase: 49 U/L (ref 11–51)

## 2024-02-08 LAB — WET PREP, GENITAL
Clue Cells Wet Prep HPF POC: NONE SEEN
Sperm: NONE SEEN
Trich, Wet Prep: NONE SEEN
WBC, Wet Prep HPF POC: >=10 — AB (ref <10)
Yeast Wet Prep HPF POC: NONE SEEN

## 2024-02-08 MED ORDER — TIZANIDINE HCL 2 MG PO TABS: 2.0000 mg | ORAL_TABLET | Freq: Three times a day (TID) | ORAL | 2 refills | Status: DC | PRN

## 2024-02-08 MED ORDER — KETOROLAC TROMETHAMINE 30 MG/ML IJ SOLN
30.0000 mg | Freq: Once | INTRAMUSCULAR | Status: AC
  Administered 2024-02-08: 30 mg via INTRAVENOUS
  Filled 2024-02-08: qty 1

## 2024-02-08 MED ORDER — IOHEXOL 300 MG/ML  SOLN
100.0000 mL | Freq: Once | INTRAMUSCULAR | Status: AC | PRN
  Administered 2024-02-08: 100 mL via INTRAVENOUS

## 2024-02-08 NOTE — ED Triage Notes (Signed)
 States had hysterectomy on 4/1. Reports spotting and lower back and abd cramping x 2 days. Recently seen here for similar complaints on 6/19 but states never followed up with OB.

## 2024-02-08 NOTE — Discharge Instructions (Signed)
 Recommend pelvic rest (nothing per vagina) until you follow up with your GYN.

## 2024-02-08 NOTE — ED Provider Notes (Signed)
 [place] EMERGENCY DEPARTMENT AT [facility] Provider Note   CSN: [id number] Arrival date & time: 02/08/24  9349     Patient presents with: No chief complaint on file.   Mary Sanford is a 58 y.o. female.  {Add pertinent medical, surgical, social history, OB history to HPI:32947} HPI     58 year old female with a history of prediabetes, hypertension, hyperlipidemia, hyperaldosteronism, status post laparoscopic hysterectomy and salpingo-oophorectomy bilateral in April 2025, who presents with concern for right lower abdominal pain, vaginal bleeding.   Was seen in June and positive for BV and trichomonas and given prescription for metronidazole .  Has hx of back pain.  New cramping lower abdominal pain yesterday, now is 8/10, last night was 10/10, right lower abdomen. Cramping into middle lower back.   Had bleeding when urinated, think it was vaginal.  Has some vaginal pain. Sunday went to urinate saw the blood.    No nausea, vomiting, fever, diarrhea, nor constipation.  Appetite is low.   No numbness/weakness, loss control of bowel or bladder     Past Medical History:  Diagnosis Date   Arthritis    hands, left knee   Chronic low back pain    Depression    Dyslipidemia    Family history of adverse reaction to anesthesia    one sister ponv and slow to awaken   GAD (generalized anxiety disorder)    History of kidney stones    Hyperaldosteronism (HCC)    followed by pcp   (abd MRI in epic 10-13-2023 normal)   Iron deficiency anemia    Malignant hypertension    ED visit in epic 09-22-2023 HTN urgency, bp 165/101;   previously sent to GYN office 09-15-2023 for bp 193/ 115   Panic disorder    PMB (postmenopausal bleeding)    Pre-diabetes    followed by pcp   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 tanda   Seasonal allergies    SUI (stress urinary incontinence, female)    Uterine fibroid    Wears contact lenses    Wears glasses      Past Surgical History:  Procedure Laterality Date   ABDOMINAL HYSTERECTOMY     BUNIONECTOMY Bilateral    CESAREAN SECTION  1989   COLONOSCOPY WITH PROPOFOL   11/05/2020   dr glo   CYSTOSCOPY N/A 11/03/2023   Procedure: CYSTOSCOPY;  Surgeon: Jeralyn Crutch, MD;  Location: [facility];  Service: Gynecology;  Laterality: N/A;   CYSTOSCOPY W/ RETROGRADES Left 06/28/2018   Procedure: CYSTOSCOPY WITH RETROGRADE PYELOGRAM LEFT STENT;  Surgeon: Devere Lonni Righter, MD;  Location: [facility];  Service: Urology;  Laterality: Left;   CYSTOSCOPY/URETEROSCOPY/HOLMIUM LASER/STENT PLACEMENT Left 07/16/2018   Procedure: CYSTOSCOPY/URETEROSCOPY/HOLMIUM LASER/STENT EXCHANGE;  Surgeon: Devere Lonni Righter, MD;  Location: [facility];  Service: Urology;  Laterality: Left;   DILATATION & CURETTAGE/HYSTEROSCOPY WITH MYOSURE N/A 12/13/2019   Procedure: DILATATION & CURETTAGE/HYSTEROSCOPY WITH MYOSURE;  Surgeon: Jannis Kate Norris, MD;  Location: [facility];  Service: Gynecology;  Laterality: N/A;   DILITATION & CURRETTAGE/HYSTROSCOPY WITH NOVASURE ABLATION N/A 06/17/2013   Procedure: DILATATION & CURETTAGE/HYSTEROSCOPY WITH NOVASURE ABLATION;  Surgeon: Gloris DELENA Hugger, MD;  Location: [facility];  Service: Gynecology;  Laterality: N/A;   ESOPHAGOGASTRODUODENOSCOPY (EGD) WITH PROPOFOL   10/11/2020   dr glo   FINGER SURGERY  04/2021   thumb   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   Procedure: LAPAROSCOPIC ROUX-EN-Y GASTRIC BYPASS WITH UPPER ENDOSCOPY;  Surgeon: tanda Locus, MD;  Location: [facility];  Service: General;  Laterality: N/A;   HEMORRHOIDECTOMY WITH HEMORRHOID BANDING  10/19/2012   SCA THD Hem ligation/pexy   LAPAROSCOPIC TUBAL LIGATION Bilateral 2001   OPERATIVE ULTRASOUND N/A 12/13/2019   Procedure: OPERATIVE ULTRASOUND;  Surgeon: Jannis Kate Norris, MD;  Location: [facility];  Service: Gynecology;  Laterality: N/A;   ROBOTIC ASSISTED  LAPAROSCOPIC HYSTERECTOMY AND SALPINGECTOMY Bilateral 11/03/2023   Procedure: XI ROBOTIC ASSISTED LAPAROSCOPIC HYSTERECTOMY AND SALPINGO-OOPHERECTOMY;  Surgeon: Jeralyn Crutch, MD;  Location: [facility];  Service: Gynecology;  Laterality: Bilateral;   WISDOM TOOTH EXTRACTION      Prior to Admission medications   Medication Sig Start Date End Date Taking? Authorizing Provider  acetaminophen  (TYLENOL ) 500 MG tablet Take 1-2 tablets (500-1,000 mg total) by mouth as needed for headache or moderate pain (pain score 4-6). 11/03/23   Ajewole, Christana, MD  amLODipine  (NORVASC ) 5 MG tablet Take 1 tablet (5 mg total) by mouth daily. Take 1 tablet by mouth once daily. Patient needs to follow up with PCP prior to future refills 01/22/24   Joshua Debby CROME, MD  atorvastatin  (LIPITOR) 10 MG tablet Take 1 tablet by mouth once daily 01/01/24   Joshua Debby CROME, MD  escitalopram  (LEXAPRO ) 20 MG tablet Take 1 tablet by mouth once daily 12/16/23   Joshua Debby CROME, MD  estradiol  (ESTRACE ) 0.1 MG/GM vaginal cream Apply 1 gram per vagina every night for 2 weeks, then apply three times a week 12/30/23   Ajewole, Christana, MD  gabapentin  (NEURONTIN ) 100 MG capsule Take 1-3 capsules (100-300 mg total) by mouth at bedtime. 12/30/23   Ajewole, Christana, MD  Ibuprofen -Acetaminophen  (ADVIL  DUAL ACTION) 125-250 MG TABS Take 1 tablet by mouth every 8 (eight) hours as needed (pain).    [provider]  lidocaine  (LIDODERM ) 5 % Place 1 patch onto the skin daily. Remove & Discard patch within 12 hours or as directed by MD Patient not taking: Reported on 12/30/2023 09/03/23   Geiple, Joshua, PA-C  Menthol, Topical Analgesic, (ICY HOT EX) Apply 1 Application topically daily as needed (pain). Patient not taking: Reported on 12/01/2023    [provider]  metroNIDAZOLE  (FLAGYL ) 500 MG tablet Take 1 tablet (500 mg total) by mouth 2 (two) times daily. 01/21/24   Theophilus Pagan, MD  nebivolol  (BYSTOLIC ) 10 MG tablet Take 1  tablet by mouth once daily 12/24/23   Joshua Debby CROME, MD  oxyCODONE  (OXY IR/ROXICODONE ) 5 MG immediate release tablet Take 1 tablet (5 mg total) by mouth every 4 (four) hours as needed for severe pain (pain score 7-10) or breakthrough pain. Patient not taking: Reported on 12/30/2023 11/03/23   Ajewole, Christana, MD  spironolactone  (ALDACTONE ) 100 MG tablet Take 1 tablet (100 mg total) by mouth daily. Patient taking differently: Take 100 mg by mouth daily. 09/08/23   Joshua Debby CROME, MD  spironolactone  (ALDACTONE ) 50 MG tablet Take 50 mg by mouth daily. 01/01/24   [provider]  Tetrahydrozoline HCl (VISION CLEAR OP) Place into both eyes as needed (for contacts).    [provider]  tiZANidine  (ZANAFLEX ) 2 MG tablet Take 1-2 tablets (2-4 mg total) by mouth every 8 (eight) hours as needed for muscle spasms. Patient not taking: Reported on 12/30/2023 06/19/23   Joshua Debby CROME, MD    Allergies: Naproxen     Review of Systems  Updated Vital Signs LMP 05/15/2021 (Approximate)   Physical Exam Vitals and nursing note reviewed.  Constitutional:      General: She  is not in acute distress.    Appearance: She is well-developed. She is not diaphoretic.  HENT:     Head: Normocephalic and atraumatic.  Eyes:     Conjunctiva/sclera: Conjunctivae normal.  Cardiovascular:     Rate and Rhythm: Normal rate and regular rhythm.     Heart sounds: Normal heart sounds. No murmur heard.    No friction rub. No gallop.  Pulmonary:     Effort: Pulmonary effort is normal. No respiratory distress.     Breath sounds: Normal breath sounds. No wheezing or rales.  Abdominal:     General: There is no distension.     Palpations: Abdomen is soft.     Tenderness: There is abdominal tenderness (RLQ). There is no guarding.  Genitourinary:    Comments: Slight dehiscence/shallow wound at incision site/vaginal cuff Musculoskeletal:        General: No tenderness.     Cervical back: Normal range of motion.   Skin:    General: Skin is warm and dry.     Findings: No erythema or rash.  Neurological:     Mental Status: She is alert and oriented to person, place, and time.     (all labs ordered are listed, but only abnormal results are displayed) Labs Reviewed - No data to display  EKG: None  Radiology: No results found.  {Document cardiac monitor, telemetry assessment procedure when appropriate:32947} Procedures   Medications Ordered in the ED - No data to display    {Click here for ABCD2, HEART and other calculators REFRESH Note before signing:1}                               58 year old female with a history of prediabetes, hypertension, hyperlipidemia, hyperaldosteronism, status post laparoscopic hysterectomy and salpingo-oophorectomy bilateral in April 2025, who presents with concern for right lower abdominal pain, vaginal bleeding.   DDx includes UTI, nephrolithiasis, appendicitis, abscess, vaginal infection.   Labs completed and personally evaluated and interpreted by me show no sign of urinary tract infection, no leukocytosis or anemia, CMP mildly hemolyzed with ALT of 45, lipase within normal limits and have low suspicion for pancreatitis. Wet prep negative.   CT abdomen pelvis shows no acute inflammatory process, no obstructing ureterolithiasis.   Recommend pelvic rest, follow up with her GYN.  Vaginal cuff on exam appears to have mild dehiscence or area without complete healing/shallow ulcer. No sign of cellulitis, no purulence, no active bleeding. Sent message to GYN and recommend to her close follow up.   {Document critical care time when appropriate  Document review of labs and clinical decision tools ie CHADS2VASC2, etc  Document your independent review of radiology images and any outside records  Document your discussion with family members, caretakers and with consultants  Document social determinants of health affecting pt's care  Document your decision making why  or why not admission, treatments were needed:32947:::1}   Final diagnoses:  None    ED Discharge Orders     None

## 2024-02-09 LAB — GC/CHLAMYDIA PROBE AMP (~~LOC~~) NOT AT ARMC
Chlamydia: NEGATIVE
Comment: NEGATIVE
Comment: NORMAL
Neisseria Gonorrhea: NEGATIVE

## 2024-02-11 ENCOUNTER — Encounter: Payer: Self-pay | Admitting: Family Medicine

## 2024-02-11 ENCOUNTER — Other Ambulatory Visit (HOSPITAL_COMMUNITY)
Admission: RE | Admit: 2024-02-11 | Discharge: 2024-02-11 | Disposition: A | Discharge status: Home / Self Care | Source: Ambulatory Visit | Attending: Obstetrics and Gynecology | Admitting: Obstetrics and Gynecology

## 2024-02-11 ENCOUNTER — Ambulatory Visit: Admitting: Obstetrics and Gynecology

## 2024-02-11 ENCOUNTER — Other Ambulatory Visit: Payer: Self-pay

## 2024-02-11 VITALS — BP 126/84 | HR 74 | Wt 193.0 lb

## 2024-02-11 DIAGNOSIS — N941 Unspecified dyspareunia: Secondary | ICD-10-CM | POA: Diagnosis not present

## 2024-02-11 DIAGNOSIS — N760 Acute vaginitis: Secondary | ICD-10-CM

## 2024-02-11 DIAGNOSIS — N930 Postcoital and contact bleeding: Secondary | ICD-10-CM

## 2024-02-11 DIAGNOSIS — Z113 Encounter for screening for infections with a predominantly sexual mode of transmission: Secondary | ICD-10-CM | POA: Insufficient documentation

## 2024-02-11 DIAGNOSIS — N958 Other specified menopausal and perimenopausal disorders: Secondary | ICD-10-CM

## 2024-02-11 DIAGNOSIS — Z1331 Encounter for screening for depression: Secondary | ICD-10-CM

## 2024-02-11 MED ORDER — ESTRING 2 MG VA RING: 1.0000 | VAGINAL_RING | VAGINAL | 4 refills | Status: DC

## 2024-02-11 NOTE — Progress Notes (Signed)
 GYNECOLOGY VISIT  Patient name: Mary Sanford MRN [id number]  Date of birth: May 27, 1966 Chief Complaint:   postcoital bleeding  History:  Mary Sanford is a 58 y.o. [id number] being seen today for postop bleeding.  Had sex - pain and spotting next day x1 day; having pain in back which started is prior to surgery. Having crampy back pain.   Notes having contracted trich since postop as partner had not been treated but both her and partner have since been treated. She is ok with STI testing today. She did not pick up vaginal estrogen - concerned about side effects.    Past Medical History:  Diagnosis Date   Arthritis    hands, left knee   Chronic low back pain    Depression    Dyslipidemia    Family history of adverse reaction to anesthesia    one sister ponv and slow to awaken   GAD (generalized anxiety disorder)    History of kidney stones    Hyperaldosteronism (HCC)    followed by pcp   (abd MRI in epic 10-13-2023 normal)   Iron deficiency anemia    Malignant hypertension    ED visit in epic 09-22-2023 HTN urgency, bp 165/101;   previously sent to GYN office 09-15-2023 for bp 193/ 115   Panic disorder    PMB (postmenopausal bleeding)    Pre-diabetes    followed by pcp   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 tanda   Seasonal allergies    SUI (stress urinary incontinence, female)    Uterine fibroid    Wears contact lenses    Wears glasses     Past Surgical History:  Procedure Laterality Date   ABDOMINAL HYSTERECTOMY     BUNIONECTOMY Bilateral    CESAREAN SECTION  1989   COLONOSCOPY WITH PROPOFOL   11/05/2020   dr glo   CYSTOSCOPY N/A 11/03/2023   Procedure: CYSTOSCOPY;  Surgeon: Jeralyn Crutch, MD;  Location: [facility];  Service: Gynecology;  Laterality: N/A;   CYSTOSCOPY W/ RETROGRADES Left 06/28/2018   Procedure: CYSTOSCOPY WITH RETROGRADE PYELOGRAM LEFT STENT;  Surgeon: Devere Lonni Righter, MD;  Location: [facility];  Service:  Urology;  Laterality: Left;   CYSTOSCOPY/URETEROSCOPY/HOLMIUM LASER/STENT PLACEMENT Left 07/16/2018   Procedure: CYSTOSCOPY/URETEROSCOPY/HOLMIUM LASER/STENT EXCHANGE;  Surgeon: Devere Lonni Righter, MD;  Location[facility];  Service: Urology;  Laterality: Left;   DILATATION & CURETTAGE/HYSTEROSCOPY WITH MYOSURE N/A 12/13/2019   Procedure: DILATATION & CURETTAGE/HYSTEROSCOPY WITH MYOSURE;  Surgeon: Jannis Kate Norris, MD;  Location: [facility];  Service: Gynecology;  Laterality: N/A;   DILITATION & CURRETTAGE/HYSTROSCOPY WITH NOVASURE ABLATION N/A 06/17/2013   Procedure: DILATATION & CURETTAGE/HYSTEROSCOPY WITH NOVASURE ABLATION;  Surgeon: Gloris DELENA Hugger, MD;  Location: [facility];  Service: Gynecology;  Laterality: N/A;   ESOPHAGOGASTRODUODENOSCOPY (EGD) WITH PROPOFOL   10/11/2020   dr glo   FINGER SURGERY  04/2021   thumb   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   Procedure: LAPAROSCOPIC ROUX-EN-Y GASTRIC BYPASS WITH UPPER ENDOSCOPY;  Surgeon: tanda Locus, MD;  Location: [facility];  Service: General;  Laterality: N/A;   HEMORRHOIDECTOMY WITH HEMORRHOID BANDING  10/19/2012   SCA THD Hem ligation/pexy   LAPAROSCOPIC TUBAL LIGATION Bilateral 2001   OPERATIVE ULTRASOUND N/A 12/13/2019   Procedure: OPERATIVE ULTRASOUND;  Surgeon: Jannis Kate Norris, MD;  Location: [facility];  Service: Gynecology;  Laterality: N/A;   ROBOTIC ASSISTED LAPAROSCOPIC HYSTERECTOMY AND SALPINGECTOMY Bilateral 11/03/2023   Procedure: XI  ROBOTIC ASSISTED LAPAROSCOPIC HYSTERECTOMY AND SALPINGO-OOPHERECTOMY;  Surgeon: Jeralyn Crutch, MD;  Location: [facility];  Service: Gynecology;  Laterality: Bilateral;   WISDOM TOOTH EXTRACTION      The following portions of the patient's history were reviewed and updated as appropriate: allergies, current medications, past family history, past medical history, past social history, past surgical history and problem list.    Review  of Systems:  Pertinent items are noted in HPI. Comprehensive review of systems was otherwise negative.   Objective:  Physical Exam BP 126/84   Pulse 74   Wt 193 lb (87.5 kg)   LMP 05/15/2021 (Approximate)   BMI 28.50 kg/m    Physical Exam Vitals and nursing note reviewed. Exam conducted with a chaperone present.  Constitutional:      Appearance: Normal appearance.  HENT:     Head: Normocephalic and atraumatic.  Pulmonary:     Effort: Pulmonary effort is normal.     Breath sounds: Normal breath sounds.  Genitourinary:    General: Normal vulva.     Exam position: Lithotomy position.     Vagina: Normal.     Comments: Intact vaginal cuff with 1mm erythematous region, nontender Skin:    General: Skin is warm and dry.  Neurological:     General: No focal deficit present.     Mental Status: She is alert.  Psychiatric:        Mood and Affect: Mood normal.        Behavior: Behavior normal.        Thought Content: Thought content normal.        Judgment: Judgment normal.      Labs and Imaging CT ABDOMEN PELVIS W CONTRAST Result Date: 02/08/2024 CLINICAL DATA:  RLQ abdominal pain. EXAM: CT ABDOMEN AND PELVIS WITH CONTRAST TECHNIQUE: Multidetector CT imaging of the abdomen and pelvis was performed using the standard protocol following bolus administration of intravenous contrast. RADIATION DOSE REDUCTION: This exam was performed according to the departmental dose-optimization program which includes automated exposure control, adjustment of the mA and/or kV according to patient size and/or use of iterative reconstruction technique. CONTRAST:  OMNIPAQUE  IOHEXOL  300 MG/ML  SOLN COMPARISON:  CT scan abdomen and pelvis from 06/27/2022. FINDINGS: Lower chest: The lung bases are clear. No pleural effusion. The heart is normal in size. No pericardial effusion. Hepatobiliary: The liver is normal in size. Non-cirrhotic configuration. No suspicious mass. No intrahepatic or extrahepatic bile  duct dilation. No calcified gallstones. Normal gallbladder wall thickness. No pericholecystic inflammatory changes. Pancreas: Unremarkable. No pancreatic ductal dilatation or surrounding inflammatory changes. Spleen: Within normal limits. No focal lesion. Adrenals/Urinary Tract: Adrenal glands are unremarkable. No suspicious renal mass. No hydronephrosis. There is a 2 mm nonobstructing calculus in the left kidney upper pole. No other nephroureterolithiasis on either side. Urinary bladder is under distended, precluding optimal assessment. However, no large mass or stones identified. No perivesical fat stranding. 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 No disproportionate dilation of the small or large bowel loops. No evidence of abnormal bowel wall thickening or inflammatory changes. The appendix is unremarkable. Vascular/Lymphatic: No ascites or pneumoperitoneum. No abdominal or pelvic lymphadenopathy, by size criteria. No aneurysmal dilation of the major abdominal arteries. Reproductive: The uterus is surgically absent. No large adnexal mass. Other: There is a tiny fat containing umbilical hernia. The soft tissues and abdominal wall are otherwise unremarkable. Musculoskeletal: No suspicious osseous lesions. There are mild multilevel degenerative changes in the visualized spine. IMPRESSION: 1. No acute inflammatory  process identified within the abdomen or pelvis. Unremarkable appendix. 2. Otherwise essentially unremarkable exam, as described above. Electronically Signed   By: Ree Molt M.D.   On: 02/08/2024 08:34       Assessment & Plan:   1. Postcoital bleeding (Primary) Siver nitrate applied to vaginal cuff. Recommend vaginal estrogen to help with cuff healing and re-evaluate in 4-6 weeks.   2. Acute vaginitis Vaginitis swab collected including STI testing  - Cervicovaginal ancillary only  3. Screening examination for STI - Cervicovaginal ancillary only -  RPR+HBsAg+HCVAb+...  4. Dyspareunia in female 5. Genitourinary syndrome of menopause Recommend vaginal estrogen to help with GSM symptoms and cuff healing. Will switch to ring for vaginal estrogen administration. Had discussion regarding sexual wellness, use of lubrication, importance of foreplay prior to penetration and impact of mental state/interpersonal relationship and sexual relationship.  - estradiol  (ESTRING ) 7.5 MCG/24HR vaginal ring; Place 1 each vaginally every 3 (three) months. follow package directions  Dispense: 1 each; Refill: 4    Routine preventative health maintenance measures emphasized.  Carter Quarry, MD [facility], [facility]

## 2024-02-12 ENCOUNTER — Ambulatory Visit: Payer: Self-pay | Admitting: Obstetrics and Gynecology

## 2024-02-12 DIAGNOSIS — B9689 Other specified bacterial agents as the cause of diseases classified elsewhere: Secondary | ICD-10-CM

## 2024-02-12 LAB — CERVICOVAGINAL ANCILLARY ONLY
Bacterial Vaginitis (gardnerella): POSITIVE — AB
Candida Glabrata: NEGATIVE
Candida Vaginitis: NEGATIVE
Chlamydia: NEGATIVE
Comment: NEGATIVE
Comment: NEGATIVE
Comment: NEGATIVE
Comment: NEGATIVE
Comment: NEGATIVE
Comment: NORMAL
Neisseria Gonorrhea: NEGATIVE
Trichomonas: NEGATIVE

## 2024-02-12 LAB — RPR+HBSAG+HCVAB+...
HIV Screen 4th Generation wRfx: NONREACTIVE
Hep C Virus Ab: NONREACTIVE
Hepatitis B Surface Ag: NEGATIVE
RPR Ser Ql: NONREACTIVE

## 2024-02-12 MED ORDER — METRONIDAZOLE 0.75 % VA GEL: 1.0000 | Freq: Every day | VAGINAL | 1 refills | Status: DC

## 2024-02-13 ENCOUNTER — Other Ambulatory Visit: Payer: Self-pay

## 2024-02-13 ENCOUNTER — Emergency Department (HOSPITAL_COMMUNITY)
Admission: EM | Admit: 2024-02-13 | Discharge: 2024-02-13 | Disposition: A | Discharge status: Home / Self Care | Attending: Emergency Medicine | Admitting: Emergency Medicine

## 2024-02-13 ENCOUNTER — Encounter (HOSPITAL_COMMUNITY): Payer: Self-pay | Admitting: *Deleted

## 2024-02-13 DIAGNOSIS — S61011A Laceration without foreign body of right thumb without damage to nail, initial encounter: Secondary | ICD-10-CM | POA: Insufficient documentation

## 2024-02-13 DIAGNOSIS — I10 Essential (primary) hypertension: Secondary | ICD-10-CM | POA: Insufficient documentation

## 2024-02-13 DIAGNOSIS — Z79899 Other long term (current) drug therapy: Secondary | ICD-10-CM | POA: Insufficient documentation

## 2024-02-13 DIAGNOSIS — W274XXA Contact with kitchen utensil, initial encounter: Secondary | ICD-10-CM | POA: Insufficient documentation

## 2024-02-13 NOTE — ED Triage Notes (Signed)
 States she cut her right thumb  last pm  and couldn't get get it to stop bleeding , no bleeding at present.

## 2024-02-13 NOTE — Discharge Instructions (Addendum)
 Return if any problems.

## 2024-02-13 NOTE — ED Notes (Signed)
 Wound cleaned with saline, bacitracin and band-aid placed.

## 2024-02-14 NOTE — ED Provider Notes (Signed)
 [place] EMERGENCY DEPARTMENT AT [facility] Provider Note   CSN: [id number] Arrival date & time: 02/13/24  9153     Patient presents with: Finger Injury   Mary Sanford is a 58 y.o. female.   Patient complains of a laceration to her finger.  Patient was cutting cheese with a mandolin.  Patient reports she cut off the tip of her right thumb.  Patient reports the injury happened last p.m.  Patient states that she continued to have bleeding.  Patient denies any other area of injuries.  Patient's tetanus shot is up-to-date.  Patient denies any other area of injuries patient has past medical history of hypertension and prediabetes.  The history is provided by the patient. No language interpreter was used.       Prior to Admission medications   Medication Sig Start Date End Date Taking? Authorizing Provider  acetaminophen  (TYLENOL ) 500 MG tablet Take 1-2 tablets (500-1,000 mg total) by mouth as needed for headache or moderate pain (pain score 4-6). 11/03/23   Ajewole, Christana, MD  amLODipine  (NORVASC ) 5 MG tablet Take 1 tablet (5 mg total) by mouth daily. Take 1 tablet by mouth once daily. Patient needs to follow up with PCP prior to future refills 01/22/24   Joshua Debby CROME, MD  atorvastatin  (LIPITOR) 10 MG tablet Take 1 tablet by mouth once daily 01/01/24   Joshua Debby CROME, MD  escitalopram  (LEXAPRO ) 20 MG tablet Take 1 tablet by mouth once daily 12/16/23   Joshua Debby CROME, MD  estradiol  (ESTRING ) 7.5 MCG/24HR vaginal ring Place 1 each vaginally every 3 (three) months. follow package directions 02/11/24   Ajewole, Christana, MD  gabapentin  (NEURONTIN ) 100 MG capsule Take 1-3 capsules (100-300 mg total) by mouth at bedtime. 12/30/23   Ajewole, Christana, MD  Ibuprofen -Acetaminophen  (ADVIL  DUAL ACTION) 125-250 MG TABS Take 1 tablet by mouth every 8 (eight) hours as needed (pain). Patient not taking: Reported on 02/11/2024    [provider]  lidocaine  (LIDODERM ) 5 % Place 1  patch onto the skin daily. Remove & Discard patch within 12 hours or as directed by MD Patient not taking: Reported on 02/11/2024 09/03/23   Geiple, Joshua, PA-C  Menthol, Topical Analgesic, (ICY HOT EX) Apply 1 Application topically daily as needed (pain). Patient not taking: Reported on 02/11/2024    [provider]  metroNIDAZOLE  (METROGEL ) 0.75 % vaginal gel Place 1 Applicatorful vaginally at bedtime. Apply one applicatorful to vagina at bedtime for 5 days 02/12/24   Ajewole, Christana, MD  nebivolol  (BYSTOLIC ) 10 MG tablet Take 1 tablet by mouth once daily 12/24/23   Joshua Debby CROME, MD  oxyCODONE  (OXY IR/ROXICODONE ) 5 MG immediate release tablet Take 1 tablet (5 mg total) by mouth every 4 (four) hours as needed for severe pain (pain score 7-10) or breakthrough pain. Patient not taking: Reported on 02/11/2024 11/03/23   Ajewole, Christana, MD  spironolactone  (ALDACTONE ) 100 MG tablet Take 1 tablet (100 mg total) by mouth daily. 09/08/23   Joshua Debby CROME, MD  spironolactone  (ALDACTONE ) 50 MG tablet Take 50 mg by mouth daily. Patient not taking: Reported on 02/11/2024 01/01/24   [provider]  Tetrahydrozoline HCl (VISION CLEAR OP) Place into both eyes as needed (for contacts). Patient not taking: Reported on 02/11/2024    [provider]  tiZANidine  (ZANAFLEX ) 2 MG tablet Take 1-2 tablets (2-4 mg total) by mouth every 8 (eight) hours as needed for muscle spasms. Patient not taking: Reported on 02/11/2024 02/08/24  Dreama Longs, MD    Allergies: Naproxen     Review of Systems  All other systems reviewed and are negative.   Updated Vital Signs BP (!) 144/102 (BP Location: Left Arm)   Pulse 65   Temp 98.1 F (36.7 C) (Oral)   Resp 20   Ht 5' 9 (1.753 m)   Wt 87.5 kg   LMP 05/15/2021 (Approximate)   SpO2 98%   BMI 28.50 kg/m   Physical Exam Vitals and nursing note reviewed.  Constitutional:      Appearance: She is well-developed.  HENT:     Head:  Normocephalic.  Pulmonary:     Effort: Pulmonary effort is normal.  Abdominal:     General: There is no distension.  Musculoskeletal:     Cervical back: Normal range of motion.     Comments: 4mm avulsion laceration distal tip of right finger,  no bleeding, nv and ns intact   Neurological:     Mental Status: She is alert and oriented to person, place, and time.     (all labs ordered are listed, but only abnormal results are displayed) Labs Reviewed - No data to display  EKG: None  Radiology: No results found.   Procedures   Medications Ordered in the ED - No data to display                                  Medical Decision Making Patient reports she cut her right thumb on a mandolin while cutting cheese  Risk Risk Details: Patient complains of a laceration to her right thumb, bleeding is controlled wound is bandaged.  Patient is counseled on wound care.        Final diagnoses:  Laceration of right thumb without foreign body without damage to nail, initial encounter    ED Discharge Orders     None       An After Visit Summary was printed and given to the patient.    Flint Sonny POUR, DEVONNA 02/14/24 1028    Doretha Folks, MD 02/15/24 1549

## 2024-02-16 ENCOUNTER — Encounter: Payer: Self-pay | Admitting: Internal Medicine

## 2024-02-16 ENCOUNTER — Ambulatory Visit: Admitting: Internal Medicine

## 2024-02-16 VITALS — BP 136/86 | HR 67 | Temp 98.1°F | Resp 16 | Ht 69.0 in | Wt 193.0 lb

## 2024-02-16 DIAGNOSIS — I10 Essential (primary) hypertension: Secondary | ICD-10-CM

## 2024-02-16 DIAGNOSIS — M5441 Lumbago with sciatica, right side: Secondary | ICD-10-CM | POA: Insufficient documentation

## 2024-02-16 DIAGNOSIS — Z23 Encounter for immunization: Secondary | ICD-10-CM | POA: Diagnosis not present

## 2024-02-16 DIAGNOSIS — M5416 Radiculopathy, lumbar region: Secondary | ICD-10-CM | POA: Insufficient documentation

## 2024-02-16 DIAGNOSIS — Z0000 Encounter for general adult medical examination without abnormal findings: Secondary | ICD-10-CM

## 2024-02-16 DIAGNOSIS — R7303 Prediabetes: Secondary | ICD-10-CM

## 2024-02-16 DIAGNOSIS — Z0001 Encounter for general adult medical examination with abnormal findings: Secondary | ICD-10-CM

## 2024-02-16 LAB — HEMOGLOBIN A1C: Hgb A1c MFr Bld: 6.1 % (ref 4.6–6.5)

## 2024-02-16 LAB — TSH: TSH: 1.07 u[IU]/mL (ref 0.35–5.50)

## 2024-02-16 MED ORDER — OXYCODONE HCL 5 MG PO TABS: 5.0000 mg | ORAL_TABLET | Freq: Four times a day (QID) | ORAL | 0 refills | Status: DC | PRN

## 2024-02-16 NOTE — Patient Instructions (Signed)

## 2024-02-16 NOTE — Progress Notes (Unsigned)
 Subjective:  Patient ID: Mary Sanford, female    DOB: 27-Dec-1965  Age: 58 y.o. MRN: [id number]  CC: Annual Exam, Hypertension, and Back Pain   HPI Mary Sanford presents for a CPX and f/up ---   Discussed the use of AI scribe software for clinical note transcription with the patient, who gave verbal consent to proceed.  History of Present Illness   Mary Sanford is a 58 year old female who presents with low back pain radiating to the right leg.  She has been experiencing persistent low back pain for an extended period, which began radiating to her right leg as of yesterday. No recent injury to her back. No numbness, weakness, or tingling in her legs or feet. The pain is constant and does not fluctuate.  Her current pain management includes oxycodone , acetaminophen , and tizanidine , although she feels tizanidine  is no longer effective. Gabapentin  is also part of her regimen, primarily for hot flashes.  She mentions stress related to her home life, including responsibilities with her husband, children, grandchildren, and dogs. She works part-time at [employer], which involves being on her feet all day and bending over frequently, contributing to her discomfort.  She has a history of bypass surgery, which limits her ability to take certain medications like ibuprofen  due to the risk of ulcers.       Outpatient Medications Prior to Visit  Medication Sig Dispense Refill   acetaminophen  (TYLENOL ) 500 MG tablet Take 1-2 tablets (500-1,000 mg total) by mouth as needed for headache or moderate pain (pain score 4-6). 120 tablet 1   amLODipine  (NORVASC ) 5 MG tablet Take 1 tablet (5 mg total) by mouth daily. Take 1 tablet by mouth once daily. Patient needs to follow up with PCP prior to future refills 60 tablet 0   atorvastatin  (LIPITOR) 10 MG tablet Take 1 tablet by mouth once daily 90 tablet 1   escitalopram  (LEXAPRO ) 20 MG tablet Take 1 tablet by mouth once daily 90 tablet 0   gabapentin   (NEURONTIN ) 100 MG capsule Take 1-3 capsules (100-300 mg total) by mouth at bedtime. 60 capsule 11   metroNIDAZOLE  (METROGEL ) 0.75 % vaginal gel Place 1 Applicatorful vaginally at bedtime. Apply one applicatorful to vagina at bedtime for 5 days 70 g 1   nebivolol  (BYSTOLIC ) 10 MG tablet Take 1 tablet by mouth once daily 90 tablet 0   spironolactone  (ALDACTONE ) 50 MG tablet Take 50 mg by mouth daily. (Patient taking differently: Take 50 mg by mouth daily. Patient is taking 100 mg now)     tiZANidine  (ZANAFLEX ) 2 MG tablet Take 1-2 tablets (2-4 mg total) by mouth every 8 (eight) hours as needed for muscle spasms. 60 tablet 2   oxyCODONE  (OXY IR/ROXICODONE ) 5 MG immediate release tablet Take 1 tablet (5 mg total) by mouth every 4 (four) hours as needed for severe pain (pain score 7-10) or breakthrough pain. 20 tablet 0   spironolactone  (ALDACTONE ) 100 MG tablet Take 1 tablet (100 mg total) by mouth daily. 90 tablet 0   estradiol  (ESTRING ) 7.5 MCG/24HR vaginal ring Place 1 each vaginally every 3 (three) months. follow package directions (Patient not taking: Reported on 02/16/2024) 1 each 4   Ibuprofen -Acetaminophen  (ADVIL  DUAL ACTION) 125-250 MG TABS Take 1 tablet by mouth every 8 (eight) hours as needed (pain). (Patient not taking: Reported on 02/16/2024)     lidocaine  (LIDODERM ) 5 % Place 1 patch onto the skin daily. Remove & Discard patch within 12 hours or  as directed by MD (Patient not taking: Reported on 02/16/2024) 14 patch 0   Menthol, Topical Analgesic, (ICY HOT EX) Apply 1 Application topically daily as needed (pain). (Patient not taking: Reported on 02/16/2024)     Tetrahydrozoline HCl (VISION CLEAR OP) Place into both eyes as needed (for contacts). (Patient not taking: Reported on 02/16/2024)     No facility-administered medications prior to visit.    ROS Review of Systems  Objective:  BP 136/86 (BP Location: Left Arm, Patient Position: Sitting)   Pulse 67   Temp 98.1 F (36.7 C) (Oral)    Resp 16   Ht 5' 9 (1.753 m)   Wt 193 lb (87.5 kg)   LMP 05/15/2021 (Approximate)   SpO2 96%   BMI 28.50 kg/m   BP Readings from Last 3 Encounters:  02/16/24 136/86  02/13/24 (!) 144/102  02/11/24 126/84    Wt Readings from Last 3 Encounters:  02/16/24 193 lb (87.5 kg)  02/13/24 193 lb (87.5 kg)  02/11/24 193 lb (87.5 kg)    Physical Exam Vitals reviewed.  Constitutional:      Appearance: Normal appearance.  HENT:     Nose: Nose normal.     Mouth/Throat:     Mouth: Mucous membranes are moist.  Eyes:     General: No scleral icterus.    Conjunctiva/sclera: Conjunctivae normal.  Cardiovascular:     Rate and Rhythm: Normal rate and regular rhythm.     Heart sounds: No murmur heard.    No friction rub. No gallop.  Pulmonary:     Effort: Pulmonary effort is normal.     Breath sounds: No stridor. No wheezing, rhonchi or rales.  Abdominal:     General: Abdomen is flat. There is no distension.     Palpations: There is no mass.     Tenderness: There is no abdominal tenderness. There is no guarding.     Hernia: No hernia is present.  Musculoskeletal:     Cervical back: Normal and neck supple.     Thoracic back: Normal.     Lumbar back: No swelling, edema, deformity, signs of trauma, lacerations, spasms, tenderness or bony tenderness. Normal range of motion. Positive left straight leg raise test. Negative right straight leg raise test. No scoliosis.     Right lower leg: No edema.     Left lower leg: No edema.  Lymphadenopathy:     Cervical: No cervical adenopathy.  Skin:    General: Skin is warm and dry.     Findings: No rash.  Neurological:     General: No focal deficit present.     Mental Status: She is alert. Mental status is at baseline.     Cranial Nerves: Cranial nerves 2-12 are intact.     Sensory: Sensation is intact.     Motor: Motor function is intact.     Coordination: Coordination is intact.     Gait: Gait is intact.     Deep Tendon Reflexes:     Reflex  Scores:      Tricep reflexes are 1+ on the right side and 1+ on the left side.      Bicep reflexes are 1+ on the right side and 1+ on the left side.      Brachioradialis reflexes are 1+ on the right side and 1+ on the left side.      Patellar reflexes are 1+ on the right side and 1+ on the left side.  Achilles reflexes are 1+ on the right side and 1+ on the left side.    Lab Results  Component Value Date   WBC 4.7 02/08/2024   HGB 13.3 02/08/2024   HCT 41.2 02/08/2024   PLT 249 02/08/2024   GLUCOSE 101 (H) 02/08/2024   CHOL 121 09/10/2023   TRIG 263.0 (H) 09/10/2023   HDL 57.70 09/10/2023   LDLCALC 11 09/10/2023   ALT 45 (H) 02/08/2024   AST 41 02/08/2024   NA 139 02/08/2024   K 4.6 02/08/2024   CL 104 02/08/2024   CREATININE 0.64 02/08/2024   BUN 15 02/08/2024   CO2 25 02/08/2024   TSH 1.12 06/11/2023   INR 1.1 06/27/2022   HGBA1C 6.2 06/11/2023   CT ABDOMEN PELVIS W CONTRAST Result Date: 02/08/2024 CLINICAL DATA:  RLQ abdominal pain. EXAM: CT ABDOMEN AND PELVIS WITH CONTRAST TECHNIQUE: Multidetector CT imaging of the abdomen and pelvis was performed using the standard protocol following bolus administration of intravenous contrast. RADIATION DOSE REDUCTION: This exam was performed according to the departmental dose-optimization program which includes automated exposure control, adjustment of the mA and/or kV according to patient size and/or use of iterative reconstruction technique. CONTRAST:  OMNIPAQUE  IOHEXOL  300 MG/ML  SOLN COMPARISON:  CT scan abdomen and pelvis from 06/27/2022. FINDINGS: Lower chest: The lung bases are clear. No pleural effusion. The heart is normal in size. No pericardial effusion. Hepatobiliary: The liver is normal in size. Non-cirrhotic configuration. No suspicious mass. No intrahepatic or extrahepatic bile duct dilation. No calcified gallstones. Normal gallbladder wall thickness. No pericholecystic inflammatory changes. Pancreas: Unremarkable. No  pancreatic ductal dilatation or surrounding inflammatory changes. Spleen: Within normal limits. No focal lesion. Adrenals/Urinary Tract: Adrenal glands are unremarkable. No suspicious renal mass. No hydronephrosis. There is a 2 mm nonobstructing calculus in the left kidney upper pole. No other nephroureterolithiasis on either side. Urinary bladder is under distended, precluding optimal assessment. However, no large mass or stones identified. No perivesical fat stranding. 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 No disproportionate dilation of the small or large bowel loops. No evidence of abnormal bowel wall thickening or inflammatory changes. The appendix is unremarkable. Vascular/Lymphatic: No ascites or pneumoperitoneum. No abdominal or pelvic lymphadenopathy, by size criteria. No aneurysmal dilation of the major abdominal arteries. Reproductive: The uterus is surgically absent. No large adnexal mass. Other: There is a tiny fat containing umbilical hernia. The soft tissues and abdominal wall are otherwise unremarkable. Musculoskeletal: No suspicious osseous lesions. There are mild multilevel degenerative changes in the visualized spine. IMPRESSION: 1. No acute inflammatory process identified within the abdomen or pelvis. Unremarkable appendix. 2. Otherwise essentially unremarkable exam, as described above. Electronically Signed   By: Ree Molt M.D.   On: 02/08/2024 08:34     Assessment & Plan:  Pre-diabetes -     Hemoglobin A1c; Future  Malignant hypertension -     TSH; Future  Right lumbar radiculitis -     MR LUMBAR SPINE WO CONTRAST; Future -     oxyCODONE  HCl; Take 1 tablet (5 mg total) by mouth every 6 (six) hours as needed for severe pain (pain score 7-10) or breakthrough pain.  Dispense: 25 tablet; Refill: 0  Acute right-sided low back pain with right-sided sciatica -     MR LUMBAR SPINE WO CONTRAST; Future -     oxyCODONE  HCl; Take 1 tablet (5 mg total) by  mouth every 6 (six) hours as needed for severe pain (pain score 7-10) or breakthrough  pain.  Dispense: 25 tablet; Refill: 0  Encounter for general adult medical examination with abnormal findings  Need for DTP, Hib conjugate and hepatitis B vaccine -     Heplisav-B  (HepB-CPG) Vaccine     Follow-up: Return in about 3 months (around 05/18/2024).  Debby Molt, MD

## 2024-02-17 NOTE — Telephone Encounter (Signed)
 Opened in error

## 2024-02-18 ENCOUNTER — Ambulatory Visit: Payer: Self-pay | Admitting: Internal Medicine

## 2024-02-27 ENCOUNTER — Ambulatory Visit
Admission: RE | Admit: 2024-02-27 | Discharge: 2024-02-27 | Disposition: A | Discharge status: Home / Self Care | Source: Ambulatory Visit | Attending: Internal Medicine | Admitting: Internal Medicine

## 2024-02-27 DIAGNOSIS — M5441 Lumbago with sciatica, right side: Secondary | ICD-10-CM

## 2024-02-27 DIAGNOSIS — M5416 Radiculopathy, lumbar region: Secondary | ICD-10-CM

## 2024-02-27 DIAGNOSIS — M4726 Other spondylosis with radiculopathy, lumbar region: Secondary | ICD-10-CM | POA: Diagnosis not present

## 2024-02-27 DIAGNOSIS — M5126 Other intervertebral disc displacement, lumbar region: Secondary | ICD-10-CM | POA: Diagnosis not present

## 2024-03-01 ENCOUNTER — Other Ambulatory Visit: Payer: Self-pay | Admitting: Internal Medicine

## 2024-03-01 DIAGNOSIS — M5416 Radiculopathy, lumbar region: Secondary | ICD-10-CM

## 2024-03-01 DIAGNOSIS — M5441 Lumbago with sciatica, right side: Secondary | ICD-10-CM

## 2024-03-01 NOTE — Telephone Encounter (Unsigned)
 Copied from CRM [id number]. Topic: Clinical - Medication Refill >> Mar 01, 2024  9:17 AM [place] M wrote: Medication: oxyCODONE  (OXY IR/ROXICODONE ) 5 MG immediate release tablet  Has the patient contacted their pharmacy? Yes (Agent: If no, request that the patient contact the pharmacy for the refill. If patient does not wish to contact the pharmacy document the reason why and proceed with request.) (Agent: If yes, when and what did the pharmacy advise?)  This is the patient's preferred pharmacy:  [id number] - [place] ([place]), [place] - [address] [place] ([place]) [place] [address] Phone: [phone] Fax: [phone]  Is this the correct pharmacy for this prescription? Yes If no, delete pharmacy and type the correct one.   Has the prescription been filled recently? Yes  Is the patient out of the medication? Yes  Has the patient been seen for an appointment in the last year OR does the patient have an upcoming appointment? Yes  Can we respond through MyChart? Yes  Agent: Please be advised that Rx refills may take up to 3 business days. We ask that you follow-up with your pharmacy.

## 2024-03-03 ENCOUNTER — Ambulatory Visit: Payer: Self-pay

## 2024-03-03 ENCOUNTER — Ambulatory Visit: Admitting: Family Medicine

## 2024-03-03 ENCOUNTER — Encounter: Payer: Self-pay | Admitting: Family Medicine

## 2024-03-03 ENCOUNTER — Other Ambulatory Visit: Payer: Self-pay | Admitting: Internal Medicine

## 2024-03-03 VITALS — BP 120/78 | HR 79 | Temp 97.7°F | Ht 69.0 in | Wt 193.0 lb

## 2024-03-03 DIAGNOSIS — M5441 Lumbago with sciatica, right side: Secondary | ICD-10-CM

## 2024-03-03 DIAGNOSIS — M5442 Lumbago with sciatica, left side: Secondary | ICD-10-CM

## 2024-03-03 DIAGNOSIS — M48061 Spinal stenosis, lumbar region without neurogenic claudication: Secondary | ICD-10-CM | POA: Insufficient documentation

## 2024-03-03 DIAGNOSIS — M5416 Radiculopathy, lumbar region: Secondary | ICD-10-CM

## 2024-03-03 MED ORDER — METHYLPREDNISOLONE ACETATE 40 MG/ML IJ SUSP
40.0000 mg | Freq: Once | INTRAMUSCULAR | Status: AC
  Administered 2024-03-03: 40 mg via INTRAMUSCULAR

## 2024-03-03 MED ORDER — OXYCODONE HCL 5 MG PO TABS
5.0000 mg | ORAL_TABLET | Freq: Four times a day (QID) | ORAL | 0 refills | Status: DC | PRN
Start: 2024-03-03 — End: 2024-03-31

## 2024-03-03 MED ORDER — KETOROLAC TROMETHAMINE 60 MG/2ML IM SOLN
60.0000 mg | Freq: Once | INTRAMUSCULAR | Status: AC
  Administered 2024-03-03: 60 mg via INTRAMUSCULAR

## 2024-03-03 NOTE — Telephone Encounter (Signed)
 Please advise. Looked at her MRI I don't see any results.

## 2024-03-03 NOTE — Telephone Encounter (Signed)
 FYI Only or Action Required?: Action required by provider: referral request.  Pt would like pain meds ordered, declined appt.  Patient was last seen in primary care on 02/16/2024 by Joshua Debby CROME, MD.  Called Nurse Triage reporting Back Pain.  Symptoms began several years ago.  Interventions attempted: OTC medications: tylenol .  Symptoms are: gradually worsening.  Triage Disposition: See HCP Within 4 Hours (Or PCP Triage)  Patient/caregiver understands and will follow disposition?: No  Copied from CRM #[id number]. Topic: Clinical - Red Word Triage >> Mar 03, 2024 11:04 AM Mary Sanford wrote: Red Word that prompted transfer to Nurse Triage: trying to reach PCP  MRI on her back and have not heard about the results is in severe pain haven;t been given any medication pain level is a 12 out of 10 Reason for Disposition  [1] SEVERE back pain (e.g., excruciating, unable to do any normal activities) AND [2] not improved 2 hours after pain medicine  Answer Assessment - Initial Assessment Questions 1. ONSET: When did the pain begin? (e.g., minutes, hours, days)     Ongoing-years 2. LOCATION: Where does it hurt? (upper, mid or lower back)     Lower back 3. SEVERITY: How bad is the pain?  (e.g., Scale 1-10; mild, moderate, or severe)     10/10 4. PATTERN: Is the pain constant? (e.g., yes, no; constant, intermittent)      constant 5. RADIATION: Does the pain shoot into your legs or somewhere else?     Down both legs 6. CAUSE:  What do you think is causing the back pain?      Denies, being seen by numerous providers,  7. BACK OVERUSE:  Any recent lifting of heavy objects, strenuous work or exercise?     Lifting heavy at work all the time 8. MEDICINES: What have you taken so far for the pain? (e.g., nothing, acetaminophen , NSAIDS)     Denies anything helping 9. NEUROLOGIC SYMPTOMS: Do you have any weakness, numbness, or problems with bowel/bladder control?     denies 10.  OTHER SYMPTOMS: Do you have any other symptoms? (e.g., fever, abdomen pain, burning with urination, blood in urine)       Denies  Pt requesting pain meds, declined appt, states I have already seen Dr Joshua, he has ordered the MRI, I still don't have results  Protocols used: Back Pain-A-AH

## 2024-03-03 NOTE — Progress Notes (Signed)
   Acute Office Visit  Subjective:     Patient ID: Mary Sanford, female    DOB: Jul 23, 1966, 58 y.o.   MRN: [id number]  Chief Complaint  Patient presents with   Back Pain    HPI 58 year old female presents for reevaluation of low back pain that radiates down both legs.  Has been seen for this recently and prescribed Percocet.  States she requested a refill and has not heard from anyone, is here for reevaluation. States things have not really changed and not gotten any better. She has had an MRI, is waiting for imaging to be read. Inquiring about work note.  She works at [employer] and is on her feet all day. Denies numbness, tingling, loss of strength in extremities. Denies changes in bowel or bladder habits. Denies other concerns today.  ROS Per HPI      Objective:    BP 120/78 (BP Location: Left Arm, Patient Position: Sitting)   Pulse 79   Temp 97.7 F (36.5 C) (Temporal)   Ht 5' 9 (1.753 m)   Wt 193 lb (87.5 kg)   LMP 05/15/2021 (Approximate)   SpO2 98%   BMI 28.50 kg/m    Physical Exam Vitals and nursing note reviewed.  Constitutional:      General: She is not in acute distress.    Comments: Appears uncomfortable  HENT:     Head: Normocephalic and atraumatic.     Right Ear: External ear normal.     Left Ear: External ear normal.     Nose: Nose normal.     Mouth/Throat:     Mouth: Mucous membranes are moist.     Pharynx: Oropharynx is clear.  Eyes:     Extraocular Movements: Extraocular movements intact.     Pupils: Pupils are equal, round, and reactive to light.  Cardiovascular:     Rate and Rhythm: Normal rate and regular rhythm.  Pulmonary:     Effort: Pulmonary effort is normal.  Musculoskeletal:     Cervical back: Normal range of motion.     Right lower leg: No edema.     Left lower leg: No edema.     Comments: LROM to back with sitting, standing, changing positions. Cannot tolerate exam through range of motion.  Lymphadenopathy:      Cervical: No cervical adenopathy.  Neurological:     General: No focal deficit present.     Mental Status: She is alert and oriented to person, place, and time.  Psychiatric:        Mood and Affect: Mood normal.        Thought Content: Thought content normal.     No results found for any visits on 03/03/24.      Assessment & Plan:  Acute bilateral low back pain with bilateral sciatica -     Ketorolac  Tromethamine  -     methylPREDNISolone  Acetate -     methylPREDNISolone  Acetate      No orders of the defined types were placed in this encounter.    Meds ordered this encounter  Medications   ketorolac  (TORADOL ) injection 60 mg   methylPREDNISolone  acetate (DEPO-MEDROL ) injection 40 mg   methylPREDNISolone  acetate (DEPO-MEDROL ) injection 40 mg    Return if symptoms worsen or fail to improve.  Corean LITTIE Ku, FNP

## 2024-03-03 NOTE — Patient Instructions (Addendum)
 Oxycodone  refilled   May take tizanidine  as needed  You have received an injection of Toradol  in office today for pain.  Do not take any NSAIDs including aspirin , Aleve , ibuprofen , Celebrex , meloxicam  within the next 6 hours after your injection.  You have received a steroid injection in the office today.  Follow-up with me for new or worsening symptoms.

## 2024-03-14 ENCOUNTER — Ambulatory Visit: Admitting: Obstetrics and Gynecology

## 2024-03-21 NOTE — H&P (View-Only) (Signed)
 Referring Physician:  Joshua Debby CROME, MD [address] [place],  [place] [address]  Primary Physician:  Joshua Debby CROME, MD  History of Present Illness: 03/28/2024 Ms. Mary Sanford is here today with a chief complaint of chronic back pain that has become progressively worse over the past year.  Radiates to bilateral lower extremities, left worse than right.  She states that the pain is constant but becomes worse when she is on her feet and working.  She often finds her self having to lean forward to help her pain.  She did complete physical therapy as she does not feel as though it helped at all.  She denies any frank weakness, no saddle anesthesia or incontinence.  Her pain has not been relieved with over-the-counter medication.     Weakness: none Bowel/Bladder Dysfunction: none  Conservative measures:  Physical therapy:  has participated in PT at [facility] including regular antiinflammatories:  Ketorolac , Methylprednisolone , Gabapentin , Tylenol  Injections: no epidural steroid injections  Past Surgery: none  HANAN MCWILLIAMS has no symptoms of cervical myelopathy.  The symptoms are causing a significant impact on the patient's life.   Review of Systems:  A 10 point review of systems is negative, except for the pertinent positives and negatives detailed in the HPI.  Past Medical History: Past Medical History:  Diagnosis Date   Allergy    Arthritis    hands, left knee   Chronic low back pain    Depression    Dyslipidemia    Family history of adverse reaction to anesthesia    one sister ponv and slow to awaken   GAD (generalized anxiety disorder)    History of kidney stones    Hyperaldosteronism (HCC)    followed by pcp   (abd MRI in epic 10-13-2023 normal)   Iron deficiency anemia    Malignant hypertension    ED visit in epic 09-22-2023 HTN urgency, bp 165/101;   previously sent to GYN office 09-15-2023 for bp 193/ 115   Panic disorder     PMB (postmenopausal bleeding)    Pre-diabetes    followed by pcp   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 tanda   Seasonal allergies    SUI (stress urinary incontinence, female)    Uterine fibroid    Wears contact lenses    Wears glasses     Past Surgical History: Past Surgical History:  Procedure Laterality Date   ABDOMINAL HYSTERECTOMY     BUNIONECTOMY Bilateral    CESAREAN SECTION  1989   COLONOSCOPY WITH PROPOFOL   11/05/2020   dr glo   CYSTOSCOPY N/A 11/03/2023   Procedure: CYSTOSCOPY;  Surgeon: Jeralyn Crutch, MD;  Location: [facility];  Service: Gynecology;  Laterality: N/A;   CYSTOSCOPY W/ RETROGRADES Left 06/28/2018   Procedure: CYSTOSCOPY WITH RETROGRADE PYELOGRAM LEFT STENT;  Surgeon: Devere Lonni Righter, MD;  Location: [facility];  Service: Urology;  Laterality: Left;   CYSTOSCOPY/URETEROSCOPY/HOLMIUM LASER/STENT PLACEMENT Left 07/16/2018   Procedure: CYSTOSCOPY/URETEROSCOPY/HOLMIUM LASER/STENT EXCHANGE;  Surgeon: Devere Lonni Righter, MD;  Location: [facility];  Service: Urology;  Laterality: Left;   DILATATION & CURETTAGE/HYSTEROSCOPY WITH MYOSURE N/A 12/13/2019   Procedure: DILATATION & CURETTAGE/HYSTEROSCOPY WITH MYOSURE;  Surgeon: Jannis Kate Norris, MD;  Location: [facility];  Service: Gynecology;  Laterality: N/A;   DILITATION & CURRETTAGE/HYSTROSCOPY WITH NOVASURE ABLATION N/A 06/17/2013   Procedure: DILATATION & CURETTAGE/HYSTEROSCOPY WITH NOVASURE ABLATION;  Surgeon: Gloris DELENA Hugger, MD;  Location:  [facility];  Service: Gynecology;  Laterality: N/A;   ESOPHAGOGASTRODUODENOSCOPY (EGD) WITH PROPOFOL   10/11/2020   dr glo   FINGER SURGERY  04/2021   thumb   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   Procedure: LAPAROSCOPIC ROUX-EN-Y GASTRIC BYPASS WITH UPPER ENDOSCOPY;  Surgeon: Tanda Locus, MD;  Location: [facility];  Service: General;  Laterality: N/A;   HEMORRHOIDECTOMY WITH HEMORRHOID BANDING   10/19/2012   SCA THD Hem ligation/pexy   LAPAROSCOPIC TUBAL LIGATION Bilateral 2001   OPERATIVE ULTRASOUND N/A 12/13/2019   Procedure: OPERATIVE ULTRASOUND;  Surgeon: Jannis Kate Norris, MD;  Location: [facility];  Service: Gynecology;  Laterality: N/A;   ROBOTIC ASSISTED LAPAROSCOPIC HYSTERECTOMY AND SALPINGECTOMY Bilateral 11/03/2023   Procedure: XI ROBOTIC ASSISTED LAPAROSCOPIC HYSTERECTOMY AND SALPINGO-OOPHERECTOMY;  Surgeon: Jeralyn Crutch, MD;  Location: [facility];  Service: Gynecology;  Laterality: Bilateral;   SMALL INTESTINE SURGERY  06/22/2018   gastric bypass   TUBAL LIGATION     WISDOM TOOTH EXTRACTION      Allergies: Allergies as of 03/28/2024 - Review Complete 03/28/2024  Allergen Reaction Noted   Naproxen  Other (See Comments) 12/01/2012    Medications: Outpatient Encounter Medications as of 03/28/2024  Medication Sig   acetaminophen  (TYLENOL ) 500 MG tablet Take 1-2 tablets (500-1,000 mg total) by mouth as needed for headache or moderate pain (pain score 4-6).   amLODipine  (NORVASC ) 5 MG tablet Take 1 tablet (5 mg total) by mouth daily. Take 1 tablet by mouth once daily. Patient needs to follow up with PCP prior to future refills   atorvastatin  (LIPITOR) 10 MG tablet Take 1 tablet by mouth once daily   escitalopram  (LEXAPRO ) 20 MG tablet Take 1 tablet by mouth once daily   gabapentin  (NEURONTIN ) 100 MG capsule Take 1-3 capsules (100-300 mg total) by mouth at bedtime.   nebivolol  (BYSTOLIC ) 10 MG tablet Take 1 tablet by mouth once daily   oxyCODONE  (OXY IR/ROXICODONE ) 5 MG immediate release tablet Take 1 tablet (5 mg total) by mouth every 6 (six) hours as needed for severe pain (pain score 7-10) or breakthrough pain.   spironolactone  (ALDACTONE ) 50 MG tablet Take 50 mg by mouth daily. (Patient taking differently: Take 50 mg by mouth daily. Patient is taking 100 mg now)   tiZANidine  (ZANAFLEX ) 2 MG tablet Take 1-2 tablets (2-4 mg total) by mouth every 8  (eight) hours as needed for muscle spasms.   [DISCONTINUED] estradiol  (ESTRING ) 7.5 MCG/24HR vaginal ring Place 1 each vaginally every 3 (three) months. follow package directions   [DISCONTINUED] metroNIDAZOLE  (METROGEL ) 0.75 % vaginal gel Place 1 Applicatorful vaginally at bedtime. Apply one applicatorful to vagina at bedtime for 5 days   No facility-administered encounter medications on file as of 03/28/2024.    Social History: Social History   Tobacco Use   Smoking status: Former    Types: Cigarettes   Smokeless tobacco: Never   Tobacco comments:    10-27-2023  pt stated quit in 2000/  started age 87 (43)  for 16 yrs  Vaping Use   Vaping status: Never Used  Substance Use Topics   Alcohol use: Yes    Comment: per pt could 1-2 bottles per week   Drug use: Not Currently    Types: Marijuana    Comment: last used 1990s    Family Medical History: Family History  Problem Relation Age of Onset   Hypertension Mother    Hypothyroidism Mother    Rectal cancer Mother 29   Colon cancer Mother 31  Colon polyps Mother    Asthma Mother    Cancer Mother    Hypotension Sister    Hypothyroidism Sister    Diabetes Sister    Other Sister        pacemaker   Seizures Brother    Hypothyroidism Sister    Seizures Sister    Anemia Sister    Deep vein thrombosis Sister    Seizures Paternal Grandfather    Stroke Maternal Uncle        > 55   Diabetes Maternal Grandmother    Kidney failure Maternal Grandmother    Heart attack Maternal Grandfather    Other Paternal Grandmother        tumors throughout body   Hypertension Maternal Uncle    Diabetes Daughter    Esophageal cancer Neg Hx    Stomach cancer Neg Hx    Heart disease Neg Hx     Physical Examination: @VITALWITHPAIN @  General: Patient is well developed, well nourished, calm, collected, and in no apparent distress. Attention to examination is appropriate.  Psychiatric: Patient is non-anxious.  Head:  Pupils equal,  round, and reactive to light.  ENT:  Oral mucosa appears well hydrated.  Neck:   Supple.  Full range of motion.  Respiratory: Patient is breathing without any difficulty.  Extremities: No edema.  Vascular: Palpable dorsal pedal pulses.  Skin:   On exposed skin, there are no abnormal skin lesions.  NEUROLOGICAL:     Awake, alert, oriented to person, place, and time.  Speech is clear and fluent. Fund of knowledge is appropriate.   Cranial Nerves: Pupils equal round and reactive to light.  Facial tone is symmetric.   ROM of spine: Tenderness to palpation of lumbar paraspinals  +SLR on the LLE  Strength:  Side Iliopsoas Quads Hamstring PF DF EHL  R 5 5 5 5 5 5   L 5 5 5 5 5 5    Reflexes are 2+ at the patella and achilles.    Clonus is not present.  Toes are down-going.   Bilateral upper and lower extremity sensation is intact to light touch.    Gait is normal.   No difficulty with tandem gait.   No evidence of dysmetria noted.  Medical Decision Making  Imaging: EXAM: MRI LUMBAR SPINE WITHOUT CONTRAST   TECHNIQUE: Multiplanar, multisequence MR imaging of the lumbar spine was performed. No intravenous contrast was administered.   COMPARISON:  CT abdomen/pelvis 02/08/2024. Lumbar spine MRI 10/13/2022.   FINDINGS: Segmentation: 5 lumbar vertebrae. The caudal most well-formed intervertebral disc space is designated L5-S1.   Alignment:  2 mm grade 1 retrolisthesis at L4-L5 and L5-S1.   Vertebrae: No lumbar vertebral compression fracture. Degenerative endplate edema at U87-O8 (trace), L2-L3 (trace) and at L3-L4 (mild).   Conus medullaris and cauda equina: Conus extends to the L2 level. No signal abnormality identified within the visualized distal spinal cord.   Paraspinal and other soft tissues: No acute finding within included portions of the abdomen/retroperitoneum. No paraspinal mass or collection.   Disc levels:   Unless otherwise stated, the level by level  findings below have not significantly changed from the prior MRI of 10/13/2022.   Moderate disc degeneration at T11-T12. No more than mild disc degeneration within the lumbar spine.   T11-T12: This level is imaged in the sagittal plane only. A disc bulge mildly effaces ventral thecal sac but does not result in significant spinal canal stenosis. No significant foraminal stenosis.   T12-L1: No significant disc  herniation or stenosis.   L1-L2: No significant disc herniation or stenosis.   L2-L3: Small disc bulge. No significant spinal canal or foraminal stenosis.   L3-L4: Disc bulge. Mild effacement of the ventral thecal sac without significant subarticular or central canal stenosis. No significant foraminal stenosis.   L4-L5: Grade 1 retrolisthesis. Disc bulge. Superimposed broad-based central disc protrusion eccentric to the right (at site of posterior annular fissure), slightly progressed. Mild facet hypertrophy. Mild ligamentum flavum hypertrophy on the left. The disc protrusion results in mild relative right subarticular stenosis, progressed. The disc protrusion abuts (and could irritate) the descending right L5 nerve root at this site (series 113, image 31). Mild relative central canal stenosis, also progressed. Mild relative inferior neural foraminal narrowing bilaterally.   L5-S1: Grade 1 retrolisthesis, new from the prior MRI. Small disc bulge. Facet spurring bilaterally. No significant spinal canal stenosis. Mild-to-moderate bilateral neural foraminal narrowing.   IMPRESSION: 1. Comparison is made to the prior lumbar spine MRI of 10/13/2022. 2. Spondylosis at the lumbar and visualized lower thoracic levels as outlined within the body of the report. 3. At L4-L5, a broad-based central disc protrusion eccentric to the right (at site of posterior annular fissure) has slightly progressed. This results in mild relative right subarticular stenosis which has also progressed.  The disc protrusion abuts (and could irritate) the descending right L5 nerve root at this site. Mild relative central canal stenosis, and mild relative bilateral neural foraminal narrowing, also present at this level. 4. No significant spinal canal stenosis at the remaining levels. 5. At multifactorial mild-to-moderate bilateral neural foraminal narrowing at L5-S1.  I have personally reviewed the images and agree with the above interpretation.  Assessment and Plan: Mary Sanford is a pleasant 58 y.o. female is here today with a chief complaint of chronic back pain that has become progressively worse over the past year.  Radiates to bilateral lower extremities, left worse than right.  She states that the pain is constant but becomes worse when she is on her feet and working.  She often finds her self having to lean forward to help her pain.  She did complete physical therapy as she does not feel as though it helped at all.  She denies any frank weakness, no saddle anesthesia or incontinence.  Her pain has not been relieved with over-the-counter medication.  On examination, + SLR on the LLE. She is full strength.  MRI of lumbar spine shows a broad based disc bulge at L4-5 and abuts the L5 nerve root.  Pleasure to see patient in clinic today.  She does have significant MRI findings that would explain her back and leg pain.  Would like her to undergo extension and flexion x-rays of her lumbar spine to evaluate for listhesis before making final surgical recommendations.   Thank you for involving me in the care of this patient.   I spent a total of 45 minutes in both face-to-face and non-face-to-face activities for this visit on the date of this encounter including preparing to see the patient, obtaining and reviewing separately obtained history, performing medically appropriate examination, counseling the patient and their family, ordering additional medications and tests, documenting clinical  information, independently interpreting results, coordination of care.   Lyle Decamp, PA-C Dept. of Neurosurgery

## 2024-03-21 NOTE — Progress Notes (Signed)
 Referring Physician:  Joshua Debby CROME, MD [address] [place],  [place] [address]  Primary Physician:  Joshua Debby CROME, MD  History of Present Illness: 03/28/2024 Ms. Mary Sanford is here today with a chief complaint of chronic back pain that has become progressively worse over the past year.  Radiates to bilateral lower extremities, left worse than right.  She states that the pain is constant but becomes worse when she is on her feet and working.  She often finds her self having to lean forward to help her pain.  She did complete physical therapy as she does not feel as though it helped at all.  She denies any frank weakness, no saddle anesthesia or incontinence.  Her pain has not been relieved with over-the-counter medication.     Weakness: none Bowel/Bladder Dysfunction: none  Conservative measures:  Physical therapy:  has participated in PT at [facility] including regular antiinflammatories:  Ketorolac , Methylprednisolone , Gabapentin , Tylenol  Injections: no epidural steroid injections  Past Surgery: none  Mary Sanford has no symptoms of cervical myelopathy.  The symptoms are causing a significant impact on the patient's life.   Review of Systems:  A 10 point review of systems is negative, except for the pertinent positives and negatives detailed in the HPI.  Past Medical History: Past Medical History:  Diagnosis Date   Allergy    Arthritis    hands, left knee   Chronic low back pain    Depression    Dyslipidemia    Family history of adverse reaction to anesthesia    one sister ponv and slow to awaken   GAD (generalized anxiety disorder)    History of kidney stones    Hyperaldosteronism (HCC)    followed by pcp   (abd MRI in epic 10-13-2023 normal)   Iron deficiency anemia    Malignant hypertension    ED visit in epic 09-22-2023 HTN urgency, bp 165/101;   previously sent to GYN office 09-15-2023 for bp 193/ 115   Panic disorder     PMB (postmenopausal bleeding)    Pre-diabetes    followed by pcp   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 tanda   Seasonal allergies    SUI (stress urinary incontinence, female)    Uterine fibroid    Wears contact lenses    Wears glasses     Past Surgical History: Past Surgical History:  Procedure Laterality Date   ABDOMINAL HYSTERECTOMY     BUNIONECTOMY Bilateral    CESAREAN SECTION  1989   COLONOSCOPY WITH PROPOFOL   11/05/2020   dr glo   CYSTOSCOPY N/A 11/03/2023   Procedure: CYSTOSCOPY;  Surgeon: Jeralyn Crutch, MD;  Location: [facility];  Service: Gynecology;  Laterality: N/A;   CYSTOSCOPY W/ RETROGRADES Left 06/28/2018   Procedure: CYSTOSCOPY WITH RETROGRADE PYELOGRAM LEFT STENT;  Surgeon: Devere Lonni Righter, MD;  Location: [facility];  Service: Urology;  Laterality: Left;   CYSTOSCOPY/URETEROSCOPY/HOLMIUM LASER/STENT PLACEMENT Left 07/16/2018   Procedure: CYSTOSCOPY/URETEROSCOPY/HOLMIUM LASER/STENT EXCHANGE;  Surgeon: Devere Lonni Righter, MD;  Location: [facility];  Service: Urology;  Laterality: Left;   DILATATION & CURETTAGE/HYSTEROSCOPY WITH MYOSURE N/A 12/13/2019   Procedure: DILATATION & CURETTAGE/HYSTEROSCOPY WITH MYOSURE;  Surgeon: Jannis Kate Norris, MD;  Location: [facility];  Service: Gynecology;  Laterality: N/A;   DILITATION & CURRETTAGE/HYSTROSCOPY WITH NOVASURE ABLATION N/A 06/17/2013   Procedure: DILATATION & CURETTAGE/HYSTEROSCOPY WITH NOVASURE ABLATION;  Surgeon: Gloris DELENA Hugger, MD;  Location:  [facility];  Service: Gynecology;  Laterality: N/A;   ESOPHAGOGASTRODUODENOSCOPY (EGD) WITH PROPOFOL   10/11/2020   dr glo   FINGER SURGERY  04/2021   thumb   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   Procedure: LAPAROSCOPIC ROUX-EN-Y GASTRIC BYPASS WITH UPPER ENDOSCOPY;  Surgeon: Tanda Locus, MD;  Location: [facility];  Service: General;  Laterality: N/A;   HEMORRHOIDECTOMY WITH HEMORRHOID BANDING   10/19/2012   SCA THD Hem ligation/pexy   LAPAROSCOPIC TUBAL LIGATION Bilateral 2001   OPERATIVE ULTRASOUND N/A 12/13/2019   Procedure: OPERATIVE ULTRASOUND;  Surgeon: Jannis Kate Norris, MD;  Location: [facility];  Service: Gynecology;  Laterality: N/A;   ROBOTIC ASSISTED LAPAROSCOPIC HYSTERECTOMY AND SALPINGECTOMY Bilateral 11/03/2023   Procedure: XI ROBOTIC ASSISTED LAPAROSCOPIC HYSTERECTOMY AND SALPINGO-OOPHERECTOMY;  Surgeon: Jeralyn Crutch, MD;  Location: [facility];  Service: Gynecology;  Laterality: Bilateral;   SMALL INTESTINE SURGERY  06/22/2018   gastric bypass   TUBAL LIGATION     WISDOM TOOTH EXTRACTION      Allergies: Allergies as of 03/28/2024 - Review Complete 03/28/2024  Allergen Reaction Noted   Naproxen  Other (See Comments) 12/01/2012    Medications: Outpatient Encounter Medications as of 03/28/2024  Medication Sig   acetaminophen  (TYLENOL ) 500 MG tablet Take 1-2 tablets (500-1,000 mg total) by mouth as needed for headache or moderate pain (pain score 4-6).   amLODipine  (NORVASC ) 5 MG tablet Take 1 tablet (5 mg total) by mouth daily. Take 1 tablet by mouth once daily. Patient needs to follow up with PCP prior to future refills   atorvastatin  (LIPITOR) 10 MG tablet Take 1 tablet by mouth once daily   escitalopram  (LEXAPRO ) 20 MG tablet Take 1 tablet by mouth once daily   gabapentin  (NEURONTIN ) 100 MG capsule Take 1-3 capsules (100-300 mg total) by mouth at bedtime.   nebivolol  (BYSTOLIC ) 10 MG tablet Take 1 tablet by mouth once daily   oxyCODONE  (OXY IR/ROXICODONE ) 5 MG immediate release tablet Take 1 tablet (5 mg total) by mouth every 6 (six) hours as needed for severe pain (pain score 7-10) or breakthrough pain.   spironolactone  (ALDACTONE ) 50 MG tablet Take 50 mg by mouth daily. (Patient taking differently: Take 50 mg by mouth daily. Patient is taking 100 mg now)   tiZANidine  (ZANAFLEX ) 2 MG tablet Take 1-2 tablets (2-4 mg total) by mouth every 8  (eight) hours as needed for muscle spasms.   [DISCONTINUED] estradiol  (ESTRING ) 7.5 MCG/24HR vaginal ring Place 1 each vaginally every 3 (three) months. follow package directions   [DISCONTINUED] metroNIDAZOLE  (METROGEL ) 0.75 % vaginal gel Place 1 Applicatorful vaginally at bedtime. Apply one applicatorful to vagina at bedtime for 5 days   No facility-administered encounter medications on file as of 03/28/2024.    Social History: Social History   Tobacco Use   Smoking status: Former    Types: Cigarettes   Smokeless tobacco: Never   Tobacco comments:    10-27-2023  pt stated quit in 2000/  started age 87 (43)  for 16 yrs  Vaping Use   Vaping status: Never Used  Substance Use Topics   Alcohol use: Yes    Comment: per pt could 1-2 bottles per week   Drug use: Not Currently    Types: Marijuana    Comment: last used 1990s    Family Medical History: Family History  Problem Relation Age of Onset   Hypertension Mother    Hypothyroidism Mother    Rectal cancer Mother 29   Colon cancer Mother 31  Colon polyps Mother    Asthma Mother    Cancer Mother    Hypotension Sister    Hypothyroidism Sister    Diabetes Sister    Other Sister        pacemaker   Seizures Brother    Hypothyroidism Sister    Seizures Sister    Anemia Sister    Deep vein thrombosis Sister    Seizures Paternal Grandfather    Stroke Maternal Uncle        > 55   Diabetes Maternal Grandmother    Kidney failure Maternal Grandmother    Heart attack Maternal Grandfather    Other Paternal Grandmother        tumors throughout body   Hypertension Maternal Uncle    Diabetes Daughter    Esophageal cancer Neg Hx    Stomach cancer Neg Hx    Heart disease Neg Hx     Physical Examination: @VITALWITHPAIN @  General: Patient is well developed, well nourished, calm, collected, and in no apparent distress. Attention to examination is appropriate.  Psychiatric: Patient is non-anxious.  Head:  Pupils equal,  round, and reactive to light.  ENT:  Oral mucosa appears well hydrated.  Neck:   Supple.  Full range of motion.  Respiratory: Patient is breathing without any difficulty.  Extremities: No edema.  Vascular: Palpable dorsal pedal pulses.  Skin:   On exposed skin, there are no abnormal skin lesions.  NEUROLOGICAL:     Awake, alert, oriented to person, place, and time.  Speech is clear and fluent. Fund of knowledge is appropriate.   Cranial Nerves: Pupils equal round and reactive to light.  Facial tone is symmetric.   ROM of spine: Tenderness to palpation of lumbar paraspinals  +SLR on the LLE  Strength:  Side Iliopsoas Quads Hamstring PF DF EHL  R 5 5 5 5 5 5   L 5 5 5 5 5 5    Reflexes are 2+ at the patella and achilles.    Clonus is not present.  Toes are down-going.   Bilateral upper and lower extremity sensation is intact to light touch.    Gait is normal.   No difficulty with tandem gait.   No evidence of dysmetria noted.  Medical Decision Making  Imaging: EXAM: MRI LUMBAR SPINE WITHOUT CONTRAST   TECHNIQUE: Multiplanar, multisequence MR imaging of the lumbar spine was performed. No intravenous contrast was administered.   COMPARISON:  CT abdomen/pelvis 02/08/2024. Lumbar spine MRI 10/13/2022.   FINDINGS: Segmentation: 5 lumbar vertebrae. The caudal most well-formed intervertebral disc space is designated L5-S1.   Alignment:  2 mm grade 1 retrolisthesis at L4-L5 and L5-S1.   Vertebrae: No lumbar vertebral compression fracture. Degenerative endplate edema at U87-O8 (trace), L2-L3 (trace) and at L3-L4 (mild).   Conus medullaris and cauda equina: Conus extends to the L2 level. No signal abnormality identified within the visualized distal spinal cord.   Paraspinal and other soft tissues: No acute finding within included portions of the abdomen/retroperitoneum. No paraspinal mass or collection.   Disc levels:   Unless otherwise stated, the level by level  findings below have not significantly changed from the prior MRI of 10/13/2022.   Moderate disc degeneration at T11-T12. No more than mild disc degeneration within the lumbar spine.   T11-T12: This level is imaged in the sagittal plane only. A disc bulge mildly effaces ventral thecal sac but does not result in significant spinal canal stenosis. No significant foraminal stenosis.   T12-L1: No significant disc  herniation or stenosis.   L1-L2: No significant disc herniation or stenosis.   L2-L3: Small disc bulge. No significant spinal canal or foraminal stenosis.   L3-L4: Disc bulge. Mild effacement of the ventral thecal sac without significant subarticular or central canal stenosis. No significant foraminal stenosis.   L4-L5: Grade 1 retrolisthesis. Disc bulge. Superimposed broad-based central disc protrusion eccentric to the right (at site of posterior annular fissure), slightly progressed. Mild facet hypertrophy. Mild ligamentum flavum hypertrophy on the left. The disc protrusion results in mild relative right subarticular stenosis, progressed. The disc protrusion abuts (and could irritate) the descending right L5 nerve root at this site (series 113, image 31). Mild relative central canal stenosis, also progressed. Mild relative inferior neural foraminal narrowing bilaterally.   L5-S1: Grade 1 retrolisthesis, new from the prior MRI. Small disc bulge. Facet spurring bilaterally. No significant spinal canal stenosis. Mild-to-moderate bilateral neural foraminal narrowing.   IMPRESSION: 1. Comparison is made to the prior lumbar spine MRI of 10/13/2022. 2. Spondylosis at the lumbar and visualized lower thoracic levels as outlined within the body of the report. 3. At L4-L5, a broad-based central disc protrusion eccentric to the right (at site of posterior annular fissure) has slightly progressed. This results in mild relative right subarticular stenosis which has also progressed.  The disc protrusion abuts (and could irritate) the descending right L5 nerve root at this site. Mild relative central canal stenosis, and mild relative bilateral neural foraminal narrowing, also present at this level. 4. No significant spinal canal stenosis at the remaining levels. 5. At multifactorial mild-to-moderate bilateral neural foraminal narrowing at L5-S1.  I have personally reviewed the images and agree with the above interpretation.  Assessment and Plan: Ms. Dena is a pleasant 58 y.o. female is here today with a chief complaint of chronic back pain that has become progressively worse over the past year.  Radiates to bilateral lower extremities, left worse than right.  She states that the pain is constant but becomes worse when she is on her feet and working.  She often finds her self having to lean forward to help her pain.  She did complete physical therapy as she does not feel as though it helped at all.  She denies any frank weakness, no saddle anesthesia or incontinence.  Her pain has not been relieved with over-the-counter medication.  On examination, + SLR on the LLE. She is full strength.  MRI of lumbar spine shows a broad based disc bulge at L4-5 and abuts the L5 nerve root.  Pleasure to see patient in clinic today.  She does have significant MRI findings that would explain her back and leg pain.  Would like her to undergo extension and flexion x-rays of her lumbar spine to evaluate for listhesis before making final surgical recommendations.   Thank you for involving me in the care of this patient.   I spent a total of 45 minutes in both face-to-face and non-face-to-face activities for this visit on the date of this encounter including preparing to see the patient, obtaining and reviewing separately obtained history, performing medically appropriate examination, counseling the patient and their family, ordering additional medications and tests, documenting clinical  information, independently interpreting results, coordination of care.   Lyle Decamp, PA-C Dept. of Neurosurgery

## 2024-03-22 ENCOUNTER — Other Ambulatory Visit: Payer: Self-pay | Admitting: Internal Medicine

## 2024-03-22 DIAGNOSIS — Z1231 Encounter for screening mammogram for malignant neoplasm of breast: Secondary | ICD-10-CM

## 2024-03-25 ENCOUNTER — Other Ambulatory Visit: Payer: Self-pay | Admitting: Internal Medicine

## 2024-03-25 DIAGNOSIS — I10 Essential (primary) hypertension: Secondary | ICD-10-CM

## 2024-03-26 ENCOUNTER — Other Ambulatory Visit: Payer: Self-pay | Admitting: Internal Medicine

## 2024-03-26 DIAGNOSIS — F418 Other specified anxiety disorders: Secondary | ICD-10-CM

## 2024-03-28 ENCOUNTER — Encounter: Payer: Self-pay | Admitting: Physician Assistant

## 2024-03-28 ENCOUNTER — Ambulatory Visit: Admitting: Physician Assistant

## 2024-03-28 ENCOUNTER — Ambulatory Visit
Admission: RE | Admit: 2024-03-28 | Discharge: 2024-03-28 | Disposition: A | Discharge status: Home / Self Care | Source: Ambulatory Visit | Attending: Physician Assistant | Admitting: Physician Assistant

## 2024-03-28 ENCOUNTER — Ambulatory Visit: Admitting: Podiatry

## 2024-03-28 ENCOUNTER — Ambulatory Visit
Admission: RE | Admit: 2024-03-28 | Discharge: 2024-03-28 | Disposition: A | Discharge status: Home / Self Care | Attending: Physician Assistant | Admitting: Physician Assistant

## 2024-03-28 VITALS — BP 138/96 | Ht 69.0 in | Wt 190.0 lb

## 2024-03-28 DIAGNOSIS — G8929 Other chronic pain: Secondary | ICD-10-CM | POA: Diagnosis not present

## 2024-03-28 DIAGNOSIS — M545 Low back pain, unspecified: Secondary | ICD-10-CM

## 2024-03-28 DIAGNOSIS — M51362 Other intervertebral disc degeneration, lumbar region with discogenic back pain and lower extremity pain: Secondary | ICD-10-CM | POA: Diagnosis not present

## 2024-03-28 DIAGNOSIS — M48061 Spinal stenosis, lumbar region without neurogenic claudication: Secondary | ICD-10-CM

## 2024-03-28 DIAGNOSIS — M47816 Spondylosis without myelopathy or radiculopathy, lumbar region: Secondary | ICD-10-CM | POA: Diagnosis not present

## 2024-03-28 MED ORDER — TIZANIDINE HCL 2 MG PO TABS: 2.0000 mg | ORAL_TABLET | Freq: Three times a day (TID) | ORAL | 2 refills | Status: DC | PRN

## 2024-03-28 MED ORDER — MELOXICAM 7.5 MG PO TABS: 7.5000 mg | ORAL_TABLET | Freq: Every day | ORAL | 2 refills | Status: DC

## 2024-03-30 ENCOUNTER — Ambulatory Visit
Admission: RE | Admit: 2024-03-30 | Discharge: 2024-03-30 | Disposition: A | Discharge status: Home / Self Care | Source: Ambulatory Visit

## 2024-03-30 ENCOUNTER — Ambulatory Visit: Payer: Self-pay | Admitting: *Deleted

## 2024-03-30 DIAGNOSIS — Z1231 Encounter for screening mammogram for malignant neoplasm of breast: Secondary | ICD-10-CM | POA: Diagnosis not present

## 2024-03-30 NOTE — Progress Notes (Unsigned)
    Subjective:    Patient ID: Mary Sanford, female    DOB: Dec 16, 1965, 58 y.o.   MRN: [id number]      HPI Mary Sanford is here for No chief complaint on file.   She has chronic back pain that has worsened over the past year.  She recently saw neurosurgery.  Pain radiates to bilateral lower extremities-left side worse than right.  Pain is fairly constant but worse when she is on her feet and working.  She has done physical therapy in the past.she had an MRI of her lumbar spine.  She has a broad-based disc bulge at L4-5 that abuts the L5 nerve root and some spinal stenosis.  She has significant pain.  She is looking for some relief of her pain.  Last month she had Toradol  60 mg IM and Depo-Medrol  80 mg IM which helped.  She has an appointment with Dr. Claudene with neurosurgery tomorrow to review her imaging and discuss options.    Medications and allergies reviewed with patient and updated if appropriate.  Current Outpatient Medications on File Prior to Visit  Medication Sig Dispense Refill   acetaminophen  (TYLENOL ) 500 MG tablet Take 1-2 tablets (500-1,000 mg total) by mouth as needed for headache or moderate pain (pain score 4-6). 120 tablet 1   amLODipine  (NORVASC ) 5 MG tablet Take 1 tablet (5 mg total) by mouth daily. Take 1 tablet by mouth once daily. Patient needs to follow up with PCP prior to future refills 60 tablet 0   atorvastatin  (LIPITOR) 10 MG tablet Take 1 tablet by mouth once daily 90 tablet 1   escitalopram  (LEXAPRO ) 20 MG tablet Take 1 tablet by mouth once daily 90 tablet 0   gabapentin  (NEURONTIN ) 100 MG capsule Take 1-3 capsules (100-300 mg total) by mouth at bedtime. 60 capsule 11   meloxicam  (MOBIC ) 7.5 MG tablet Take 1 tablet (7.5 mg total) by mouth daily. 30 tablet 2   nebivolol  (BYSTOLIC ) 10 MG tablet Take 1 tablet by mouth once daily 90 tablet 0   oxyCODONE  (OXY IR/ROXICODONE ) 5 MG immediate release tablet Take 1 tablet (5 mg total) by mouth every 6 (six) hours as  needed for severe pain (pain score 7-10) or breakthrough pain. 25 tablet 0   spironolactone  (ALDACTONE ) 50 MG tablet Take 50 mg by mouth daily. (Patient taking differently: Take 50 mg by mouth daily. Patient is taking 100 mg now)     tiZANidine  (ZANAFLEX ) 2 MG tablet Take 1-2 tablets (2-4 mg total) by mouth every 8 (eight) hours as needed for muscle spasms. 60 tablet 2   No current facility-administered medications on file prior to visit.    Review of Systems     Objective:  There were no vitals filed for this visit. BP Readings from Last 3 Encounters:  03/28/24 (!) 138/96  03/03/24 120/78  02/16/24 136/86   Wt Readings from Last 3 Encounters:  03/28/24 190 lb (86.2 kg)  03/03/24 193 lb (87.5 kg)  02/16/24 193 lb (87.5 kg)   There is no height or weight on file to calculate BMI.    Physical Exam         Assessment & Plan:    Encounter Diagnosis  Name Primary?   Bilateral lumbar radiculopathy Yes   Subacute

## 2024-03-30 NOTE — Telephone Encounter (Signed)
 Copied from CRM #[id number]. Topic: Clinical - Red Word Triage >> Mar 30, 2024  1:41 PM Robinson H wrote: [facility] that prompted transfer to Nurse Triage: Back pain, pain level 11 or 12 bottom of the back center, was diagnosed with spinal stenosis. Reason for Disposition  [1] MODERATE back pain (e.g., interferes with normal activities) AND [2] present > 3 days  Answer Assessment - Initial Assessment Questions 1. ONSET: When did the pain begin? (e.g., minutes, hours, days)     I'm having back pain from spinal stenosis.   I was seeing if I can get a shot for this pain.   I had an x ray yesterday.    I'm in so much pain.  I don't have any more pain pills.    Oxycodone  is out.    I want a shot in my back like last time.   I had 2 shots in my back.    2. LOCATION: Where does it hurt? (upper, mid or lower back)     My lower back.    3. SEVERITY: How bad is the pain?  (e.g., Scale 1-10; mild, moderate, or severe)     Severe  4. PATTERN: Is the pain constant? (e.g., yes, no; constant, intermittent)      Constant 5. RADIATION: Does the pain shoot into your legs or somewhere else?     It's down my both of my legs but mostly in my left leg. 6. CAUSE:  What do you think is causing the back pain?      Spinal Stenosis.     7. BACK OVERUSE:  Any recent lifting of heavy objects, strenuous work or exercise?     No 8. MEDICINES: What have you taken so far for the pain? (e.g., nothing, acetaminophen , NSAIDS)     Oxycodone .  I'm out.  9. NEUROLOGIC SYMPTOMS: Do you have any weakness, numbness, or problems with bowel/bladder control?     Not asked since this is a chronic problem 10. OTHER SYMPTOMS: Do you have any other symptoms? (e.g., fever, abdomen pain, burning with urination, blood in urine)       No 11. PREGNANCY: Is there any chance you are pregnant? When was your last menstrual period?       N/A due to age  Protocols used: Back Pain-A-AH FYI Only or Action Required?: FYI only  for provider.  Patient was last seen in primary care on 03/03/2024 by Alvia Corean CROME, FNP.  Called Nurse Triage reporting Back Pain.  Chronic issue.   Spinal stenosis but pain is worse lately.   Out of oxycodone .    Symptoms began several months ago.  Interventions attempted: Prescription medications: oxycodone  but she is out of it  Will call pharmacy for a refill.  Symptoms are: rapidly worsening.Having a lot of lower back pain  Triage Disposition: See PCP When Office is Open (Within 3 Days)  Patient/caregiver understands and will follow disposition?: Yes

## 2024-03-31 ENCOUNTER — Ambulatory Visit: Admitting: Internal Medicine

## 2024-03-31 ENCOUNTER — Encounter: Payer: Self-pay | Admitting: Internal Medicine

## 2024-03-31 VITALS — BP 130/88 | HR 79 | Temp 98.0°F | Ht 69.0 in | Wt 189.0 lb

## 2024-03-31 DIAGNOSIS — M5416 Radiculopathy, lumbar region: Secondary | ICD-10-CM

## 2024-03-31 MED ORDER — KETOROLAC TROMETHAMINE 60 MG/2ML IM SOLN
60.0000 mg | Freq: Once | INTRAMUSCULAR | Status: AC
  Administered 2024-03-31: 60 mg via INTRAMUSCULAR

## 2024-03-31 MED ORDER — OXYCODONE HCL 5 MG PO TABS
5.0000 mg | ORAL_TABLET | Freq: Four times a day (QID) | ORAL | 0 refills | Status: DC | PRN
Start: 2024-03-31 — End: 2024-04-24

## 2024-03-31 MED ORDER — METHYLPREDNISOLONE ACETATE 80 MG/ML IJ SUSP
80.0000 mg | Freq: Once | INTRAMUSCULAR | Status: AC
  Administered 2024-03-31: 80 mg via INTRAMUSCULAR

## 2024-03-31 NOTE — Patient Instructions (Addendum)
     You received injections of steroids and pain medication      Oxycodone  sent to your pharmacy.

## 2024-04-01 ENCOUNTER — Ambulatory Visit (INDEPENDENT_AMBULATORY_CARE_PROVIDER_SITE_OTHER): Admitting: Neurosurgery

## 2024-04-01 DIAGNOSIS — M5416 Radiculopathy, lumbar region: Secondary | ICD-10-CM | POA: Diagnosis not present

## 2024-04-01 DIAGNOSIS — G8929 Other chronic pain: Secondary | ICD-10-CM

## 2024-04-01 DIAGNOSIS — M48062 Spinal stenosis, lumbar region with neurogenic claudication: Secondary | ICD-10-CM

## 2024-04-01 DIAGNOSIS — M545 Low back pain, unspecified: Secondary | ICD-10-CM

## 2024-04-01 DIAGNOSIS — M48061 Spinal stenosis, lumbar region without neurogenic claudication: Secondary | ICD-10-CM

## 2024-04-03 DIAGNOSIS — M545 Low back pain, unspecified: Secondary | ICD-10-CM | POA: Insufficient documentation

## 2024-04-03 NOTE — Progress Notes (Signed)
 I had a follow-up phone call with Ms. Wilner today.  She was at home and I was in the office.  She gave consent to go forward with a phone visit.  We discussed her symptoms including her bilateral lower extremity radiculopathy and neurogenic claudication.  She has tried physical therapy.  She has done conservative measures for greater than 6 weeks and continues to have significant lower extremity numbness tingling and weakness as well as tightness with walking, standing upright, and doing her activities of daily living.  This continues to interfere with her ability to perform her work tasks as well.  We discussed her symptoms and her imaging findings, this consists of multilevel lumbar stenosis, lateral recess stenosis bilaterally at L4-5 and neuroforaminal stenosis at L5-S1.  A L4-5 stenosis is rated as mild but it does continue to worsen on interval scans.  She also has a slight retrolisthesis.  I did discuss with her that her alignment issues may lead to necessitating a spinal fusion, however in the setting of absence of any frank instability and a grade 1 or less spondylolisthesis I would prefer to try a decompression prior to any consideration of fusion.  She agrees with this plan.  Will plan to follow-up with a surgical phone call.  Spent a total of 10 minutes on this call.

## 2024-04-05 NOTE — Telephone Encounter (Signed)
 Patient had a appt with Dr. Claudene on 04/01/24.

## 2024-04-06 ENCOUNTER — Ambulatory Visit (INDEPENDENT_AMBULATORY_CARE_PROVIDER_SITE_OTHER): Admitting: Endocrinology

## 2024-04-06 ENCOUNTER — Other Ambulatory Visit: Payer: Self-pay | Admitting: Internal Medicine

## 2024-04-06 VITALS — BP 124/70 | HR 56 | Resp 97 | Ht 69.0 in | Wt 194.0 lb

## 2024-04-06 DIAGNOSIS — E269 Hyperaldosteronism, unspecified: Secondary | ICD-10-CM

## 2024-04-06 DIAGNOSIS — I10 Essential (primary) hypertension: Secondary | ICD-10-CM | POA: Diagnosis not present

## 2024-04-06 NOTE — Progress Notes (Signed)
 Outpatient Endocrinology Note [place] Scotlynn Noyes, MD   Patient's Name: Mary Sanford    DOB: 05/12/1966    MRN: [id number]  REASON OF VISIT: New consult for hypertension with a concern of primary aldosteronism.  REFERRING PROVIDER: Joshua Debby CROME, MD  PCP: Joshua Debby CROME, MD  HISTORY OF PRESENT ILLNESS:   Mary Sanford is a 58 y.o. old female with past medical history listed below, is here for new consult hypertension with a concern of primary aldosteronism.   Pertinent History: Patient is referred to endocrinology for further evaluation and management of hypertension with a concern of primary aldosteronism.  Patient was evaluated by primary care provider around October/November 2024 with significantly elevated blood pressure requiring ER visit in the range of 160s/100s.  At that time patient was taking triamcinolone -HCTZ for blood pressure control.  Patient had secondary workup for hypertension, showed elevated aldosterone renin ratio of 45 with aldosterone level 28 and renin of 0.62.   Latest Reference Range & Units 06/11/23 15:06  ALDOSTERONE  ng/dL 28  ALDO / PRA Ratio 0.9 - 28.9 Ratio 45.2 (H)  Renin Activity 0.25 - 5.82 ng/mL/h 0.62  (H): Data is abnormally high  Current antihypertensive medications amlodipine  5 mg daily, Nebivolol  10 mg daily, spironolactone  50 mg daily.  She has controlled blood pressure.  Imaging studies in July 2025 with CT abdomen with contrast and MRI abdomen with and without contrast in February 2024 : Normal adrenal glands with no lesion.  Renal US  Doppler with no renal artery stenosis.  REVIEW OF SYSTEMS:  As per history of present illness.   PAST MEDICAL HISTORY: Past Medical History:  Diagnosis Date   Allergy    Arthritis    hands, left knee   Chronic low back pain    Depression    Dyslipidemia    Family history of adverse reaction to anesthesia    one sister ponv and slow to awaken   GAD (generalized anxiety disorder)    History of kidney  stones    Hyperaldosteronism (HCC)    followed by pcp   (abd MRI in epic 10-13-2023 normal)   Iron deficiency anemia    Malignant hypertension    ED visit in epic 09-22-2023 HTN urgency, bp 165/101;   previously sent to GYN office 09-15-2023 for bp 193/ 115   Panic disorder    PMB (postmenopausal bleeding)    Pre-diabetes    followed by pcp   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 wilson   Seasonal allergies    SUI (stress urinary incontinence, female)    Uterine fibroid    Wears contact lenses    Wears glasses     PAST SURGICAL HISTORY: Past Surgical History:  Procedure Laterality Date   ABDOMINAL HYSTERECTOMY     BUNIONECTOMY Bilateral    CESAREAN SECTION  1989   COLONOSCOPY WITH PROPOFOL   11/05/2020   dr glo   CYSTOSCOPY N/A 11/03/2023   Procedure: CYSTOSCOPY;  Surgeon: Jeralyn Crutch, MD;  Location: [facility];  Service: Gynecology;  Laterality: N/A;   CYSTOSCOPY W/ RETROGRADES Left 06/28/2018   Procedure: CYSTOSCOPY WITH RETROGRADE PYELOGRAM LEFT STENT;  Surgeon: Devere Lonni Righter, MD;  Location: [facility];  Service: Urology;  Laterality: Left;   CYSTOSCOPY/URETEROSCOPY/HOLMIUM LASER/STENT PLACEMENT Left 07/16/2018   Procedure: CYSTOSCOPY/URETEROSCOPY/HOLMIUM LASER/STENT EXCHANGE;  Surgeon: Devere Lonni Righter, MD;  Location: [facility];  Service: Urology;  Laterality: Left;   DILATATION & CURETTAGE/HYSTEROSCOPY WITH MYOSURE N/A 12/13/2019  Procedure: DILATATION & CURETTAGE/HYSTEROSCOPY WITH MYOSURE;  Surgeon: Jannis Kate Norris, MD;  Location: [facility];  Service: Gynecology;  Laterality: N/A;   DILITATION & CURRETTAGE/HYSTROSCOPY WITH NOVASURE ABLATION N/A 06/17/2013   Procedure: DILATATION & CURETTAGE/HYSTEROSCOPY WITH NOVASURE ABLATION;  Surgeon: Gloris DELENA Hugger, MD;  Location: [facility];  Service: Gynecology;  Laterality: N/A;   ESOPHAGOGASTRODUODENOSCOPY (EGD) WITH PROPOFOL   10/11/2020   dr  glo   FINGER SURGERY  04/2021   thumb   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   Procedure: LAPAROSCOPIC ROUX-EN-Y GASTRIC BYPASS WITH UPPER ENDOSCOPY;  Surgeon: Tanda Locus, MD;  Location: [facility];  Service: General;  Laterality: N/A;   HEMORRHOIDECTOMY WITH HEMORRHOID BANDING  10/19/2012   SCA THD Hem ligation/pexy   LAPAROSCOPIC TUBAL LIGATION Bilateral 2001   OPERATIVE ULTRASOUND N/A 12/13/2019   Procedure: OPERATIVE ULTRASOUND;  Surgeon: Jannis Kate Norris, MD;  Location: [facility] ;  Service: Gynecology;  Laterality: N/A;   ROBOTIC ASSISTED LAPAROSCOPIC HYSTERECTOMY AND SALPINGECTOMY Bilateral 11/03/2023   Procedure: XI ROBOTIC ASSISTED LAPAROSCOPIC HYSTERECTOMY AND SALPINGO-OOPHERECTOMY;  Surgeon: Jeralyn Crutch, MD;  Location: [facility];  Service: Gynecology;  Laterality: Bilateral;   SMALL INTESTINE SURGERY  06/22/2018   gastric bypass   TUBAL LIGATION     WISDOM TOOTH EXTRACTION      ALLERGIES: Allergies  Allergen Reactions   Naproxen  Other (See Comments)    Panic attacks No Problem with Motrin     FAMILY HISTORY:  Family History  Problem Relation Age of Onset   Hypertension Mother    Hypothyroidism Mother    Rectal cancer Mother 61   Colon cancer Mother 64   Colon polyps Mother    Asthma Mother    Cancer Mother    Hypotension Sister    Hypothyroidism Sister    Diabetes Sister    Other Sister        pacemaker   Hypothyroidism Sister    Seizures Sister    Anemia Sister    Deep vein thrombosis Sister    Diabetes Daughter    Stroke Maternal Uncle        > 55   Hypertension Maternal Uncle    Diabetes Maternal Grandmother    Kidney failure Maternal Grandmother    Heart attack Maternal Grandfather    Other Paternal Grandmother        tumors throughout body   Seizures Paternal Grandfather    Seizures Brother    Esophageal cancer Neg Hx    Stomach cancer Neg Hx    Heart disease Neg Hx    Breast cancer Neg Hx     SOCIAL  HISTORY: Social History   Socioeconomic History   Marital status: Married    Spouse name: Not on file   Number of children: Not on file   Years of education: Not on file   Highest education level: Some college, no degree  Occupational History   Occupation: minister  Tobacco Use   Smoking status: Former    Types: Cigarettes   Smokeless tobacco: Never   Tobacco comments:    10-27-2023  pt stated quit in 2000/  started age 89 (45)  for 16 yrs  Vaping Use   Vaping status: Never Used  Substance and Sexual Activity   Alcohol use: Yes    Comment: per pt could 1-2 bottles per week   Drug use: Not Currently    Types: Marijuana    Comment: last used 1990s   Sexual activity: Yes    Partners: Male  Birth control/protection: Surgical, Post-menopausal    Comment: BTL, Ablation  Other Topics Concern   Not on file  Social History Narrative   Not on file   Social Drivers of Health   Financial Resource Strain: Low Risk  (11/03/2022)   Overall Financial Resource Strain (CARDIA)    Difficulty of Paying Living Expenses: Not very hard  Food Insecurity: No Food Insecurity (02/11/2024)   Hunger Vital Sign    Worried About Running Out of Food in the Last Year: Never true    Ran Out of Food in the Last Year: Never true  Transportation Needs: No Transportation Needs (11/03/2023)   PRAPARE - Administrator, Civil Service (Medical): No    Lack of Transportation (Non-Medical): No  Physical Activity: Insufficiently Active (11/03/2022)   Exercise Vital Sign    Days of Exercise per Week: 3 days    Minutes of Exercise per Session: 30 min  Stress: Stress Concern Present (11/03/2022)   [employer] of Occupational Health - Occupational Stress Questionnaire    Feeling of Stress : Very much  Social Connections: Socially Integrated (11/03/2022)   Social Connection and Isolation Panel    Frequency of Communication with Friends and Family: More than three times a week    Frequency of Social  Gatherings with Friends and Family: Once a week    Attends Religious Services: More than 4 times per year    Active Member of [employer] or Organizations: Yes    Attends Engineer, structural: More than 4 times per year    Marital Status: Married    MEDICATIONS:  Current Outpatient Medications  Medication Sig Dispense Refill   acetaminophen  (TYLENOL ) 500 MG tablet Take 1-2 tablets (500-1,000 mg total) by mouth as needed for headache or moderate pain (pain score 4-6). 120 tablet 1   amLODipine  (NORVASC ) 5 MG tablet Take 1 tablet (5 mg total) by mouth daily. Take 1 tablet by mouth once daily. Patient needs to follow up with PCP prior to future refills 60 tablet 0   atorvastatin  (LIPITOR) 10 MG tablet Take 1 tablet by mouth once daily 90 tablet 1   escitalopram  (LEXAPRO ) 20 MG tablet Take 1 tablet by mouth once daily 90 tablet 0   gabapentin  (NEURONTIN ) 100 MG capsule Take 1-3 capsules (100-300 mg total) by mouth at bedtime. 60 capsule 11   meloxicam  (MOBIC ) 7.5 MG tablet Take 1 tablet (7.5 mg total) by mouth daily. 30 tablet 2   nebivolol  (BYSTOLIC ) 10 MG tablet Take 1 tablet by mouth once daily 90 tablet 0   oxyCODONE  (OXY IR/ROXICODONE ) 5 MG immediate release tablet Take 1 tablet (5 mg total) by mouth every 6 (six) hours as needed for severe pain (pain score 7-10) or breakthrough pain. 25 tablet 0   spironolactone  (ALDACTONE ) 50 MG tablet Take 50 mg by mouth daily. (Patient taking differently: Take 50 mg by mouth daily. Patient is taking 100 mg now)     tiZANidine  (ZANAFLEX ) 2 MG tablet Take 1-2 tablets (2-4 mg total) by mouth every 8 (eight) hours as needed for muscle spasms. 60 tablet 2   No current facility-administered medications for this visit.    PHYSICAL EXAM: Vitals:   04/06/24 1407  BP: 124/70  Pulse: (!) 56  Resp: (!) 97  Weight: 194 lb (88 kg)  Height: 5' 9 (1.753 m)   Body mass index is 28.65 kg/m.  Wt Readings from Last 3 Encounters:  04/06/24 194 lb (88 kg)   03/31/24 189  lb (85.7 kg)  03/28/24 190 lb (86.2 kg)    General: Well developed, well nourished female in no apparent distress.  HEENT: AT/Benedict, no external lesions. Hearing intact to the spoken word Eyes: EOMI. Conjunctiva clear and no icterus. Neck: Trachea midline, neck supple without appreciable thyromegaly or lymphadenopathy and no palpable thyroid  nodules Lungs: Clear to auscultation, no wheeze. Respirations not labored Heart: S1S2, Regular in rate and rhythm. No loud murmurs Abdomen: Soft, non tender, non distended, no masses, no striae Neurologic: Alert, oriented, normal speech, deep tendon biceps reflexes normal,  no gross focal neurological deficit Extremities: No pedal pitting edema, no tremors of outstretched hands Skin: Warm, color good.  Psychiatric: Does not appear depressed or anxious  PERTINENT HISTORIC LABORATORY AND IMAGING STUDIES:  All pertinent laboratory results were reviewed. Please see HPI for further details.  Lab Results  Component Value Date   CO2 25 02/08/2024   CL 104 02/08/2024   NA 139 02/08/2024   GLUCOSE 101 (H) 02/08/2024   BUN 15 02/08/2024   No components found for: CORTRAND, CORTISOL TOTAL AM, ALDOSTERONE, RENIN ACTIVITY, DEHYDROEPIANDROSTERONE SULFATE, CATECHOLAMINES FRACTIONATED     ASSESSMENT / PLAN  1. Hypertension, unspecified type    Patient had workup for hypertension, aldosterone renin ratio was elevated was with aldosterone of 28 and renin 0.62, in November 2024.  She was at that time taking triamterene  - hydrochlorothiazide . She has concern of primary aldosteronism however cannot be confirmed, lab results may have been affected by antihypertensive medications at time.  She needs repeat laboratory evaluation.  Imaging studies with MRI and CT scan did not show adrenal lesion, bilateral adrenal glands are normal.  Currently she has controlled blood pressure on 3 antihypertensive medications spironolactone /amlodipine /Nebivolol .  Plan for additional laboratory evaluation requires stopping current antihypertensive medications as these will alter test results.  In this context she also has no adrenal lesion on imagings.  If she has hyperaldosteronism also she is on medical therapy with spironolactone .  And she has controlled blood pressure.  Discussed that even on current medications laboratory test can be done however results need to be interpreted in the context.  Discussed that additional laboratory workup can be considered versus no additional lab and continuation of current medications.  Patient does not want to do additional workup/laboratory tests.  This is reasonable in this context.  Plan: - Continue medical therapy for hypertension including spironolactone . - No additional laboratory workup today. - She will continue to follow-up with primary care provider for blood pressure management.   Diagnoses and all orders for this visit:  Hypertension, unspecified type    DISPOSITION Follow up in clinic in PRN suggested.  All questions answered and patient verbalized understanding of the plan.  [place] Saabir Blyth, MD [facility] Endocrinology [facility] [address] [place], [address] [place], [place] [address] Phone # [phone]  At least part of this note was generated using voice recognition software. Inadvertent word errors may have occurred, which were not recognized during the proofreading process.

## 2024-04-06 NOTE — Telephone Encounter (Signed)
 Patient called again wanting an update. I informed her that we are waiting for the provider to give you the information for booking so we can start the auth process. She wants to know how long insurance authorizations typically take and dates we had in mind so she can start planning things accordingly. Please send her a mychart message

## 2024-04-08 ENCOUNTER — Other Ambulatory Visit: Payer: Self-pay

## 2024-04-08 ENCOUNTER — Ambulatory Visit: Payer: Self-pay | Admitting: Neurosurgery

## 2024-04-08 ENCOUNTER — Telehealth: Payer: Self-pay

## 2024-04-08 DIAGNOSIS — M4317 Spondylolisthesis, lumbosacral region: Secondary | ICD-10-CM

## 2024-04-08 DIAGNOSIS — Z01818 Encounter for other preprocedural examination: Secondary | ICD-10-CM

## 2024-04-08 DIAGNOSIS — M48061 Spinal stenosis, lumbar region without neurogenic claudication: Secondary | ICD-10-CM

## 2024-04-08 DIAGNOSIS — M48062 Spinal stenosis, lumbar region with neurogenic claudication: Secondary | ICD-10-CM

## 2024-04-08 NOTE — Telephone Encounter (Signed)
Message answered in another encounter.

## 2024-04-08 NOTE — Telephone Encounter (Signed)
 Planned surgery: L4-S1 Lumbar Laminectomy and Decompression   Surgery date: 04/19/24 at [facility]: [address], [place], [place] [address]) - you will find out your arrival time the business day before your surgery.   Pre-op appointment at [facility] Pre-admit Testing: you will receive a call with a date/time for this appointment. If you are scheduled for an in person appointment, Pre-admit Testing is located on the first floor of the [facility] building, [address], [address]. During this appointment, they will advise you which medications you can take the morning of surgery, and which medications you will need to hold for surgery. Labs (such as blood work, EKG) may be done at your pre-op appointment. You are not required to fast for these labs. Should you need to change your pre-op appointment, please call Pre-admit testing at [phone].        Common restrictions after spine surgery: No bending, lifting, or twisting ("BLT"). Avoid lifting objects heavier than 10 pounds for the first 6 weeks after surgery. Where possible, avoid household activities that involve lifting, bending, reaching, pushing, or pulling such as laundry, vacuuming, grocery shopping, and childcare. Try to arrange for help from friends and family for these activities while you heal. Do not drive while taking prescription pain medication. Weeks 6 through 12 after surgery: avoid lifting more than 25 pounds.     How to contact us :  If you have any questions/concerns before or after surgery, you can reach us  at [phone], or you can send a mychart message. We can be reached by phone or mychart 8am-4pm, Monday-Friday.  *Please note: Calls after 4pm are forwarded to a third party answering service. Mychart messages are not routinely monitored during evenings, weekends, and holidays. Please call our office to contact the answering service for urgent concerns during  non-business hours.    If you have FMLA/disability paperwork, please drop it off or fax it to [phone]   Appointments/FMLA & disability paperwork: [employer] Registered Nurse/Surgery scheduler: Jonathon Castelo, RN Certified Medical Assistants: Don, CMA, Elenor, CMA, & Damien, CMA Physician Assistants: Lyle Decamp, PA-C, Edsel Goods, PA-C & Glade Boys, PA-C Surgeons: Penne Sharps, MD & Reeves Daisy, MD   [facility] PREADMIT TESTING VISIT and SURGERY INFORMATION SHEET   Now that surgery has been scheduled you can anticipate several phone calls from [facility] services. A pharmacy technician will call you to verify your current list of medications taken at home.               The Pre-Service Center will call to verify your insurance information and to give you billing estimates and information.             The Preadmit Testing Office will be calling to schedule a visit to obtain information for the anesthesia team and provide instructions on preparation for surgery.  What can you expect for the Preadmit Testing Visit: Appointments may be scheduled in-person or by telephone.  If a telephone visit is scheduled, you may be asked to come into the office to have lab tests or other studies performed.   This visit will not be completed any greater than 14 days prior to your surgery.  If your surgery has been scheduled for a future date, please do not be alarmed if we have not contacted you to schedule an appointment more than a month prior to the surgery date.    Please be prepared to provide the  following information during this appointment:            -Personal medical history                                               -Medication and allergy list            -Any history of problems with anesthesia              -Recent lab work or diagnostic studies            -Please notify us  of any needs we should be aware of to provide the best care possible            -You will be provided with instructions on how to prepare for your surgery.    On The Day of Surgery:  You must have a driver to take you home after surgery, you will be asked not to drive for 24 hours following surgery.  Taxi, Gisele and non-medical transport will not be acceptable means of transportation unless you have a responsible individual who will be traveling with you.  Visitors in the surgical area:   2 people will be able to visit you in your room once your preparation for surgery has been completed. During surgery, your visitors will be asked to wait in the Surgery Waiting Area.  It is not a requirement for them to stay, if they prefer to leave and come back.  Your visitor(s) will be given an update once the surgery has been completed.  No visitors are allowed in the initial recovery room to respect patient privacy and safety.  Once you are more awake and transfer to the secondary recovery area, or are transferred to an inpatient room, visitors will again be able to see you.  To respect and protect your privacy: We will ask on the day of surgery who your driver will be and what the contact number for that individual will be. We will ask if it is okay to share information with this individual, or if there is an alternative individual that we, or the surgeon, should contact to provide updates and information. If family or friends come to the surgical information desk requesting information about you, who you have not listed with us , no information will be given.   It may be helpful to designate someone as the main contact who will be responsible for updating your other friends and family.    PREADMIT TESTING OFFICE: [phone] SAME DAY SURGERY: [phone] We look forward to caring for you before and throughout the process of your surgery.

## 2024-04-09 ENCOUNTER — Encounter: Payer: Self-pay | Admitting: Endocrinology

## 2024-04-13 ENCOUNTER — Encounter
Admission: RE | Admit: 2024-04-13 | Discharge: 2024-04-13 | Disposition: A | Discharge status: Home / Self Care | Source: Ambulatory Visit | Attending: Neurosurgery | Admitting: Neurosurgery

## 2024-04-13 ENCOUNTER — Other Ambulatory Visit: Payer: Self-pay

## 2024-04-13 VITALS — BP 150/93 | HR 53 | Temp 98.2°F | Resp 18 | Ht 69.0 in | Wt 194.9 lb

## 2024-04-13 DIAGNOSIS — Z01818 Encounter for other preprocedural examination: Secondary | ICD-10-CM | POA: Diagnosis not present

## 2024-04-13 DIAGNOSIS — E269 Hyperaldosteronism, unspecified: Secondary | ICD-10-CM | POA: Diagnosis not present

## 2024-04-13 DIAGNOSIS — R7303 Prediabetes: Secondary | ICD-10-CM | POA: Diagnosis not present

## 2024-04-13 DIAGNOSIS — I10 Essential (primary) hypertension: Secondary | ICD-10-CM | POA: Diagnosis not present

## 2024-04-13 DIAGNOSIS — Z01812 Encounter for preprocedural laboratory examination: Secondary | ICD-10-CM

## 2024-04-13 DIAGNOSIS — I493 Ventricular premature depolarization: Secondary | ICD-10-CM | POA: Diagnosis not present

## 2024-04-13 LAB — CBC
HCT: 37 % (ref 36.0–46.0)
Hemoglobin: 11.8 g/dL — ABNORMAL LOW (ref 12.0–15.0)
MCH: 23.7 pg — ABNORMAL LOW (ref 26.0–34.0)
MCHC: 31.9 g/dL (ref 30.0–36.0)
MCV: 74.4 fL — ABNORMAL LOW (ref 80.0–100.0)
Platelets: 232 K/uL (ref 150–400)
RBC: 4.97 MIL/uL (ref 3.87–5.11)
RDW: 14.3 % (ref 11.5–15.5)
WBC: 5 K/uL (ref 4.0–10.5)
nRBC: 0 % (ref 0.0–0.2)

## 2024-04-13 LAB — BASIC METABOLIC PANEL WITH GFR
Anion gap: 13 (ref 5–15)
BUN: 17 mg/dL (ref 6–20)
CO2: 25 mmol/L (ref 22–32)
Calcium: 9 mg/dL (ref 8.9–10.3)
Chloride: 104 mmol/L (ref 98–111)
Creatinine, Ser: 0.61 mg/dL (ref 0.44–1.00)
GFR, Estimated: >60 mL/min (ref >60)
Glucose, Bld: 92 mg/dL (ref 70–99)
Potassium: 4 mmol/L (ref 3.5–5.1)
Sodium: 142 mmol/L (ref 135–145)

## 2024-04-13 LAB — TYPE AND SCREEN
ABO/RH(D): B POS
Antibody Screen: NEGATIVE

## 2024-04-13 LAB — URINALYSIS, ROUTINE W REFLEX MICROSCOPIC
Bilirubin Urine: NEGATIVE
Glucose, UA: NEGATIVE mg/dL
Hgb urine dipstick: NEGATIVE
Ketones, ur: NEGATIVE mg/dL
Leukocytes,Ua: NEGATIVE
Nitrite: NEGATIVE
Protein, ur: NEGATIVE mg/dL
Specific Gravity, Urine: 1.023 (ref 1.005–1.030)
pH: 6 (ref 5.0–8.0)

## 2024-04-13 LAB — SURGICAL PCR SCREEN
MRSA, PCR: NEGATIVE
Staphylococcus aureus: NEGATIVE

## 2024-04-13 NOTE — Patient Instructions (Addendum)
 Your procedure is scheduled on: Tuesday 04/19/24 Report to the Registration Desk on the 1st floor of the [facility]. To find out your arrival time, please call [phone] between 1PM - 3PM on: Monday 04/18/24 If your arrival time is 6:00 am, do not arrive before that time as the [facility] entrance doors do not open until 6:00 am.  REMEMBER: Instructions that are not followed completely may result in serious medical risk, up to and including death; or upon the discretion of your surgeon and anesthesiologist your surgery may need to be rescheduled.  Do not eat food after midnight the night before surgery.  No gum chewing or hard candies.  You may however, drink CLEAR liquids up to 2 hours before you are scheduled to arrive for your surgery. Do not drink anything within 2 hours of your scheduled arrival time.  Clear liquids include: - water   - apple juice without pulp - gatorade (not RED colors) - black coffee or tea (Do NOT add milk or creamers to the coffee or tea) Do NOT drink anything that is not on this list.  One week prior to surgery: Stop Anti-inflammatories (NSAIDS) such as Advil , Aleve , Ibuprofen , Motrin , Naproxen , meloxicam  (MOBIC ), Naprosyn  and Aspirin  based products such as Excedrin, Goody's Powder, BC Powder. Stop ANY OVER THE COUNTER supplements until after surgery. (BEET ROOT PO)   You may however, continue to take Tylenol  if needed for pain up until the day of surgery.  Continue taking all of your other prescription medications up until the day of surgery.  ON THE DAY OF SURGERY ONLY TAKE THESE MEDICATIONS WITH SIPS OF WATER :  amLODipine  (NORVASC ) 5 MG  escitalopram  (LEXAPRO ) 20 MG  nebivolol  (BYSTOLIC ) 10 MG    No Alcohol for 24 hours before or after surgery.  No Smoking including e-cigarettes for 24 hours before surgery.  No chewable tobacco products for at least 6 hours before surgery.  No nicotine patches on the day of surgery.  Do not use any  recreational drugs for at least a week (preferably 2 weeks) before your surgery.  Please be advised that the combination of cocaine and anesthesia may have negative outcomes, up to and including death. If you test positive for cocaine, your surgery will be cancelled.  On the morning of surgery brush your teeth with toothpaste and water , you may rinse your mouth with mouthwash if you wish. Do not swallow any toothpaste or mouthwash.  Use CHG Soap or wipes as directed on instruction sheet.  Do not wear jewelry, make-up, hairpins, clips or nail polish.  For welded (permanent) jewelry: bracelets, anklets, waist bands, etc.  Please have this removed prior to surgery.  If it is not removed, there is a chance that hospital personnel will need to cut it off on the day of surgery.  Do not wear lotions, powders, or perfumes.   Do not shave body hair from the neck down 48 hours before surgery.  Contact lenses, hearing aids and dentures may not be worn into surgery.  Do not bring valuables to the hospital. [facility] is not responsible for any missing/lost belongings or valuables.   Notify your doctor if there is any change in your medical condition (cold, fever, infection).  Wear comfortable clothing (specific to your surgery type) to the hospital.  After surgery, you can help prevent lung complications by doing breathing exercises.  Take deep breaths and cough every 1-2 hours. Your doctor may order a device called an Incentive Spirometer to help  you take deep breaths. When coughing or sneezing, hold a pillow firmly against your incision with both hands. This is called "splinting." Doing this helps protect your incision. It also decreases belly discomfort.  If you are being admitted to the hospital overnight, leave your suitcase in the car. After surgery it may be brought to your room.  In case of increased patient census, it may be necessary for you, the patient, to continue your  postoperative care in the Same Day Surgery department.  If you are being discharged the day of surgery, you will not be allowed to drive home. You will need a responsible individual to drive you home and stay with you for 24 hours after surgery.   If you are taking public transportation, you will need to have a responsible individual with you.  Please call the Pre-admissions Testing Dept. at [phone] if you have any questions about these instructions.  Surgery Visitation Policy:  Patients having surgery or a procedure may have two visitors.  Children under the age of 48 must have an adult with them who is not the patient.  Inpatient Visitation:    Visiting hours are 7 a.m. to 8 p.m. Up to four visitors are allowed at one time in a patient room. The visitors may rotate out with other people during the day.  One visitor age 37 or older may stay with the patient overnight and must be in the room by 8 p.m.   Merchandiser, retail to address health-related social needs:  [email or web address]    Pre-operative 5 CHG Bath Instructions   You can play a key role in reducing the risk of infection after surgery. Your skin needs to be as free of germs as possible. You can reduce the number of germs on your skin by washing with CHG (chlorhexidine  gluconate) soap before surgery. CHG is an antiseptic soap that kills germs and continues to kill germs even after washing.   DO NOT use if you have an allergy to chlorhexidine /CHG or antibacterial soaps. If your skin becomes reddened or irritated, stop using the CHG and notify one of our RNs at [phone].   Please shower with the CHG soap starting 4 days before surgery using the following schedule:     Please keep in mind the following:  DO NOT shave, including legs and underarms, starting the day of your first shower.   You may shave your face at any point before/day of surgery.  Place clean sheets on your bed the day you  start using CHG soap. Use a clean washcloth (not used since being washed) for each shower. DO NOT sleep with pets once you start using the CHG.   CHG Shower Instructions:  If you choose to wash your hair and private area, wash first with your normal shampoo/soap.  After you use shampoo/soap, rinse your hair and body thoroughly to remove shampoo/soap residue.  Turn the water  OFF and apply about 3 tablespoons (45 ml) of CHG soap to a CLEAN washcloth.  Apply CHG soap ONLY FROM YOUR NECK DOWN TO YOUR TOES (washing for 3-5 minutes)  DO NOT use CHG soap on face, private areas, open wounds, or sores.  Pay special attention to the area where your surgery is being performed.  If you are having back surgery, having someone wash your back for you may be helpful. Wait 2 minutes after CHG soap is applied, then you may rinse off the CHG soap.  Pat dry with a clean towel  Put on clean clothes/pajamas   If you choose to wear lotion, please use ONLY the CHG-compatible lotions on the back of this paper.     Additional instructions for the day of surgery: DO NOT APPLY any lotions, deodorants, cologne, or perfumes.   Put on clean/comfortable clothes.  Brush your teeth.  Ask your nurse before applying any prescription medications to the skin.      CHG Compatible Lotions   Aveeno Moisturizing lotion  Cetaphil Moisturizing Cream  Cetaphil Moisturizing Lotion  Clairol Herbal Essence Moisturizing Lotion, Dry Skin  Clairol Herbal Essence Moisturizing Lotion, Extra Dry Skin  Clairol Herbal Essence Moisturizing Lotion, Normal Skin  Curel Age Defying Therapeutic Moisturizing Lotion with Alpha Hydroxy  Curel Extreme Care Body Lotion  Curel Soothing Hands Moisturizing Hand Lotion  Curel Therapeutic Moisturizing Cream, Fragrance-Free  Curel Therapeutic Moisturizing Lotion, Fragrance-Free  Curel Therapeutic Moisturizing Lotion, Original Formula  Eucerin Daily Replenishing Lotion  Eucerin Dry Skin Therapy  Plus Alpha Hydroxy Crme  Eucerin Dry Skin Therapy Plus Alpha Hydroxy Lotion  Eucerin Original Crme  Eucerin Original Lotion  Eucerin Plus Crme Eucerin Plus Lotion  Eucerin TriLipid Replenishing Lotion  Keri Anti-Bacterial Hand Lotion  Keri Deep Conditioning Original Lotion Dry Skin Formula Softly Scented  Keri Deep Conditioning Original Lotion, Fragrance Free Sensitive Skin Formula  Keri Lotion Fast Absorbing Fragrance Free Sensitive Skin Formula  Keri Lotion Fast Absorbing Softly Scented Dry Skin Formula  Keri Original Lotion  Keri Skin Renewal Lotion Keri Silky Smooth Lotion  Keri Silky Smooth Sensitive Skin Lotion  Nivea Body Creamy Conditioning Oil  Nivea Body Extra Enriched Lotion  Nivea Body Original Lotion  Nivea Body Sheer Moisturizing Lotion Nivea Crme  Nivea Skin Firming Lotion  NutraDerm 30 Skin Lotion  NutraDerm Skin Lotion  NutraDerm Therapeutic Skin Cream  NutraDerm Therapeutic Skin Lotion  ProShield Protective Hand Cream  Provon moisturizing lotion  How to Use an Incentive Spirometer  An incentive spirometer is a tool that measures how well you are filling your lungs with each breath. Learning to take long, deep breaths using this tool can help you keep your lungs clear and active. This may help to reverse or lessen your chance of developing breathing (pulmonary) problems, especially infection. You may be asked to use a spirometer: After a surgery. If you have a lung problem or a history of smoking. After a long period of time when you have been unable to move or be active. If the spirometer includes an indicator to show the highest number that you have reached, your health care provider or respiratory therapist will help you set a goal. Keep a log of your progress as told by your health care provider. What are the risks? Breathing too quickly may cause dizziness or cause you to pass out. Take your time so you do not get dizzy or light-headed. If you are in pain,  you may need to take pain medicine before doing incentive spirometry. It is harder to take a deep breath if you are having pain. How to use your incentive spirometer  Sit up on the edge of your bed or on a chair. Hold the incentive spirometer so that it is in an upright position. Before you use the spirometer, breathe out normally. Place the mouthpiece in your mouth. Make sure your lips are closed tightly around it. Breathe in slowly and as deeply as you can through your mouth, causing the piston or the ball to rise toward the top of the chamber. Hold  your breath for 3-5 seconds, or for as long as possible. If the spirometer includes a coach indicator, use this to guide you in breathing. Slow down your breathing if the indicator goes above the marked areas. Remove the mouthpiece from your mouth and breathe out normally. The piston or ball will return to the bottom of the chamber. Rest for a few seconds, then repeat the steps 10 or more times. Take your time and take a few normal breaths between deep breaths so that you do not get dizzy or light-headed. Do this every 1-2 hours when you are awake. If the spirometer includes a goal marker to show the highest number you have reached (best effort), use this as a goal to work toward during each repetition. After each set of 10 deep breaths, cough a few times. This will help to make sure that your lungs are clear. If you have an incision on your chest or abdomen from surgery, place a pillow or a rolled-up towel firmly against the incision when you cough. This can help to reduce pain while taking deep breaths and coughing. General tips When you are able to get out of bed: Walk around often. Continue to take deep breaths and cough in order to clear your lungs. Keep using the incentive spirometer until your health care provider says it is okay to stop using it. If you have been in the hospital, you may be told to keep using the spirometer at  home. Contact a health care provider if: You are having difficulty using the spirometer. You have trouble using the spirometer as often as instructed. Your pain medicine is not giving enough relief for you to use the spirometer as told. You have a fever. Get help right away if: You develop shortness of breath. You develop a cough with bloody mucus from the lungs. You have fluid or blood coming from an incision site after you cough. Summary An incentive spirometer is a tool that can help you learn to take long, deep breaths to keep your lungs clear and active. You may be asked to use a spirometer after a surgery, if you have a lung problem or a history of smoking, or if you have been inactive for a long period of time. Use your incentive spirometer as instructed every 1-2 hours while you are awake. If you have an incision on your chest or abdomen, place a pillow or a rolled-up towel firmly against your incision when you cough. This will help to reduce pain. Get help right away if you have shortness of breath, you cough up bloody mucus, or blood comes from your incision when you cough. This information is not intended to replace advice given to you by your health care provider. Make sure you discuss any questions you have with your health care provider.

## 2024-04-18 ENCOUNTER — Telehealth: Payer: Self-pay

## 2024-04-18 NOTE — Telephone Encounter (Signed)
 Attempted to call patient again. Call went to voicemail.

## 2024-04-18 NOTE — Telephone Encounter (Signed)
 Received notification at 2:34pm on 04/18/24 that this patient has a secondary policy that was added to her account on 04/11/24 that requires prior authorization for her surgery. I contacted [employer] [place]  to request an expedited authorization (Ph# [phone]) and was told authorization requests are only accepted via fax form. I have filled out the form and faxed it to the # on the bottom of the form.

## 2024-04-18 NOTE — Telephone Encounter (Signed)
 Phone goes straight to voicemail. Voicemail message does not have any identifying information on it. Left message to return call tomorrow morning or check mychart.

## 2024-04-18 NOTE — Telephone Encounter (Signed)
 Options are that she can present to the hospital tomorrow but if we do not have approval in time for her surgery, she will need to reschedule. Alternatively, we can just plan to reschedule. Dr Claudene has offered to add her on for Friday 9/19 (subject to approval). I have spoken to the OR about this.  Attempted to reach patient x 3 to discuss options for surgery tomorrow. Phone goes straight to voicemail. Left message to contact us  via mychart or call us  in the morning.

## 2024-04-18 NOTE — Telephone Encounter (Signed)
 I spoke with the patient. She will plan to come to the hospital in the morning, but will reschedule if we do not have approval at the time of her surgery.

## 2024-04-19 DIAGNOSIS — M4317 Spondylolisthesis, lumbosacral region: Secondary | ICD-10-CM

## 2024-04-19 DIAGNOSIS — M48062 Spinal stenosis, lumbar region with neurogenic claudication: Secondary | ICD-10-CM | POA: Insufficient documentation

## 2024-04-19 DIAGNOSIS — M48061 Spinal stenosis, lumbar region without neurogenic claudication: Secondary | ICD-10-CM | POA: Insufficient documentation

## 2024-04-19 NOTE — Telephone Encounter (Signed)
 I have a fax confirmation with a time stamp of 04/18/2024 at 4:57pm EST stating the transmission was successful, 52 pages. I just called [facility] and was informed by Camie that sometimes if it is over 50 pages, their fax doesn't accept the document even though it says it went through. I have refaxed the clinicals in 2 separate documents (1 with the fax form and 1 with the clinicals). Camie confirmed they received the fax form while I was on the phone with her and advised me to call back in 30 minutes to see if they have received the clinicals.

## 2024-04-19 NOTE — Telephone Encounter (Signed)
 Spoke with Mary Sanford at [facility] around 10:30am on 04/19/24. She confirmed the clinicals were received and a case has been built, but has not been assigned to a nurse yet. Ref# [id number]. They do not call or send faxes with determinations. They only mail out letters and expect us  to call to request updates. I updated the patient. She has agreed to postpone her surgery to Friday 9/19.

## 2024-04-22 ENCOUNTER — Other Ambulatory Visit: Payer: Self-pay

## 2024-04-22 ENCOUNTER — Ambulatory Visit

## 2024-04-22 ENCOUNTER — Encounter
Admission: RE | Disposition: A | Discharge status: Home / Self Care | Payer: Self-pay | Source: Home / Self Care | Attending: Neurosurgery

## 2024-04-22 ENCOUNTER — Ambulatory Visit: Payer: Self-pay

## 2024-04-22 ENCOUNTER — Inpatient Hospital Stay
Admission: RE | Admit: 2024-04-22 | Discharge: 2024-04-24 | DRG: 517 | Disposition: A | Discharge status: Home / Self Care | Attending: Neurosurgery | Admitting: Neurosurgery

## 2024-04-22 ENCOUNTER — Encounter: Payer: Self-pay | Admitting: Neurosurgery

## 2024-04-22 DIAGNOSIS — G8929 Other chronic pain: Secondary | ICD-10-CM | POA: Diagnosis present

## 2024-04-22 DIAGNOSIS — Z825 Family history of asthma and other chronic lower respiratory diseases: Secondary | ICD-10-CM

## 2024-04-22 DIAGNOSIS — M48062 Spinal stenosis, lumbar region with neurogenic claudication: Secondary | ICD-10-CM | POA: Diagnosis present

## 2024-04-22 DIAGNOSIS — M1712 Unilateral primary osteoarthritis, left knee: Secondary | ICD-10-CM | POA: Diagnosis present

## 2024-04-22 DIAGNOSIS — M48061 Spinal stenosis, lumbar region without neurogenic claudication: Secondary | ICD-10-CM | POA: Diagnosis present

## 2024-04-22 DIAGNOSIS — Z820 Family history of epilepsy and other diseases of the nervous system: Secondary | ICD-10-CM

## 2024-04-22 DIAGNOSIS — M5416 Radiculopathy, lumbar region: Secondary | ICD-10-CM

## 2024-04-22 DIAGNOSIS — I10 Essential (primary) hypertension: Secondary | ICD-10-CM | POA: Diagnosis present

## 2024-04-22 DIAGNOSIS — M19042 Primary osteoarthritis, left hand: Secondary | ICD-10-CM | POA: Diagnosis present

## 2024-04-22 DIAGNOSIS — Z886 Allergy status to analgesic agent status: Secondary | ICD-10-CM

## 2024-04-22 DIAGNOSIS — Z01818 Encounter for other preprocedural examination: Secondary | ICD-10-CM

## 2024-04-22 DIAGNOSIS — E785 Hyperlipidemia, unspecified: Secondary | ICD-10-CM | POA: Diagnosis present

## 2024-04-22 DIAGNOSIS — Z79899 Other long term (current) drug therapy: Secondary | ICD-10-CM | POA: Diagnosis not present

## 2024-04-22 DIAGNOSIS — M4317 Spondylolisthesis, lumbosacral region: Secondary | ICD-10-CM | POA: Diagnosis present

## 2024-04-22 DIAGNOSIS — Z83719 Family history of colon polyps, unspecified: Secondary | ICD-10-CM | POA: Diagnosis not present

## 2024-04-22 DIAGNOSIS — Z9884 Bariatric surgery status: Secondary | ICD-10-CM

## 2024-04-22 DIAGNOSIS — M4807 Spinal stenosis, lumbosacral region: Secondary | ICD-10-CM | POA: Diagnosis present

## 2024-04-22 DIAGNOSIS — Z87891 Personal history of nicotine dependence: Secondary | ICD-10-CM

## 2024-04-22 DIAGNOSIS — Z8249 Family history of ischemic heart disease and other diseases of the circulatory system: Secondary | ICD-10-CM | POA: Diagnosis not present

## 2024-04-22 DIAGNOSIS — Z809 Family history of malignant neoplasm, unspecified: Secondary | ICD-10-CM | POA: Diagnosis not present

## 2024-04-22 DIAGNOSIS — M19041 Primary osteoarthritis, right hand: Secondary | ICD-10-CM | POA: Diagnosis present

## 2024-04-22 DIAGNOSIS — M4726 Other spondylosis with radiculopathy, lumbar region: Secondary | ICD-10-CM | POA: Diagnosis present

## 2024-04-22 DIAGNOSIS — Z800 Family history of malignant neoplasm of digestive organs: Secondary | ICD-10-CM

## 2024-04-22 DIAGNOSIS — Z833 Family history of diabetes mellitus: Secondary | ICD-10-CM

## 2024-04-22 DIAGNOSIS — R7303 Prediabetes: Secondary | ICD-10-CM | POA: Diagnosis present

## 2024-04-22 HISTORY — PX: LUMBAR LAMINECTOMY/DECOMPRESSION MICRODISCECTOMY: SHX5026

## 2024-04-22 SURGERY — LUMBAR LAMINECTOMY/DECOMPRESSION MICRODISCECTOMY 2 LEVELS
Anesthesia: General | Site: Back

## 2024-04-22 MED ORDER — MENTHOL 3 MG MT LOZG: 1.0000 | LOZENGE | OROMUCOSAL | Status: DC | PRN

## 2024-04-22 MED ORDER — ONDANSETRON HCL 4 MG PO TABS: 4.0000 mg | ORAL_TABLET | Freq: Four times a day (QID) | ORAL | Status: DC | PRN

## 2024-04-22 MED ORDER — MIDAZOLAM HCL 2 MG/2ML IJ SOLN
INTRAMUSCULAR | Status: DC | PRN
  Administered 2024-04-22: 2 mg via INTRAVENOUS

## 2024-04-22 MED ORDER — SURGIFLO WITH THROMBIN (HEMOSTATIC MATRIX KIT) OPTIME
TOPICAL | Status: DC | PRN
  Administered 2024-04-22: 1 via TOPICAL

## 2024-04-22 MED ORDER — FENTANYL CITRATE (PF) 100 MCG/2ML IJ SOLN
25.0000 ug | INTRAMUSCULAR | Status: DC | PRN
  Administered 2024-04-22 (×2): 50 ug via INTRAVENOUS

## 2024-04-22 MED ORDER — ROCURONIUM BROMIDE 10 MG/ML (PF) SYRINGE
PREFILLED_SYRINGE | INTRAVENOUS | Status: AC
  Filled 2024-04-22: qty 10

## 2024-04-22 MED ORDER — SUCCINYLCHOLINE CHLORIDE 200 MG/10ML IV SOSY
PREFILLED_SYRINGE | INTRAVENOUS | Status: DC | PRN
  Administered 2024-04-22: 100 mg via INTRAVENOUS

## 2024-04-22 MED ORDER — 0.9 % SODIUM CHLORIDE (POUR BTL) OPTIME
TOPICAL | Status: DC | PRN
  Administered 2024-04-22: 500 mL

## 2024-04-22 MED ORDER — PHENOL 1.4 % MT LIQD: 1.0000 | OROMUCOSAL | Status: DC | PRN

## 2024-04-22 MED ORDER — ONDANSETRON HCL 4 MG/2ML IJ SOLN: 4.0000 mg | Freq: Four times a day (QID) | INTRAMUSCULAR | Status: DC | PRN

## 2024-04-22 MED ORDER — OXYCODONE HCL 5 MG PO TABS
ORAL_TABLET | ORAL | Status: AC
  Filled 2024-04-22: qty 2

## 2024-04-22 MED ORDER — MAGNESIUM CITRATE PO SOLN: 1.0000 | Freq: Once | ORAL | Status: DC | PRN

## 2024-04-22 MED ORDER — ACETAMINOPHEN 650 MG RE SUPP: 650.0000 mg | RECTAL | Status: DC | PRN

## 2024-04-22 MED ORDER — ACETAMINOPHEN 325 MG PO TABS: 650.0000 mg | ORAL_TABLET | ORAL | Status: DC | PRN

## 2024-04-22 MED ORDER — SODIUM CHLORIDE 0.9% FLUSH: 3.0000 mL | INTRAVENOUS | Status: DC | PRN

## 2024-04-22 MED ORDER — PROPOFOL 1000 MG/100ML IV EMUL
INTRAVENOUS | Status: AC
Start: 2024-04-22 — End: 2024-04-22
  Filled 2024-04-22: qty 100

## 2024-04-22 MED ORDER — LABETALOL HCL 5 MG/ML IV SOLN
10.0000 mg | Freq: Once | INTRAVENOUS | Status: AC
  Administered 2024-04-22: 10 mg via INTRAVENOUS
  Filled 2024-04-22: qty 4

## 2024-04-22 MED ORDER — FENTANYL CITRATE (PF) 100 MCG/2ML IJ SOLN
INTRAMUSCULAR | Status: AC
  Filled 2024-04-22: qty 2

## 2024-04-22 MED ORDER — GLYCOPYRROLATE 0.2 MG/ML IJ SOLN
INTRAMUSCULAR | Status: DC | PRN
  Administered 2024-04-22: .2 mg via INTRAVENOUS

## 2024-04-22 MED ORDER — ONDANSETRON HCL 4 MG/2ML IJ SOLN
INTRAMUSCULAR | Status: DC | PRN
  Administered 2024-04-22: 4 mg via INTRAVENOUS

## 2024-04-22 MED ORDER — DROPERIDOL 2.5 MG/ML IJ SOLN: 0.6250 mg | Freq: Once | INTRAMUSCULAR | Status: DC | PRN

## 2024-04-22 MED ORDER — LIDOCAINE HCL (CARDIAC) PF 100 MG/5ML IV SOSY
PREFILLED_SYRINGE | INTRAVENOUS | Status: DC | PRN
  Administered 2024-04-22 (×2): 100 mg via INTRAVENOUS

## 2024-04-22 MED ORDER — TIZANIDINE HCL 2 MG PO TABS
2.0000 mg | ORAL_TABLET | Freq: Three times a day (TID) | ORAL | Status: DC | PRN
  Administered 2024-04-22: 2 mg via ORAL
  Filled 2024-04-22 (×2): qty 2
  Filled 2024-04-22: qty 1

## 2024-04-22 MED ORDER — MIDAZOLAM HCL 2 MG/2ML IJ SOLN
INTRAMUSCULAR | Status: AC
  Filled 2024-04-22: qty 2

## 2024-04-22 MED ORDER — LACTATED RINGERS IV SOLN: INTRAVENOUS | Status: DC

## 2024-04-22 MED ORDER — ACETAMINOPHEN 500 MG PO TABS
1000.0000 mg | ORAL_TABLET | Freq: Four times a day (QID) | ORAL | Status: AC
  Administered 2024-04-22 – 2024-04-23 (×4): 1000 mg via ORAL
  Filled 2024-04-22 (×4): qty 2

## 2024-04-22 MED ORDER — ENOXAPARIN SODIUM 40 MG/0.4ML IJ SOSY
40.0000 mg | PREFILLED_SYRINGE | INTRAMUSCULAR | Status: DC
Start: 2024-04-23 — End: 2024-04-24
  Administered 2024-04-23 – 2024-04-24 (×2): 40 mg via SUBCUTANEOUS
  Filled 2024-04-22 (×2): qty 0.4

## 2024-04-22 MED ORDER — PHENYLEPHRINE HCL-NACL 20-0.9 MG/250ML-% IV SOLN
INTRAVENOUS | Status: DC | PRN
  Administered 2024-04-22: 160 ug via INTRAVENOUS
  Administered 2024-04-22: 13.333 ug/min via INTRAVENOUS

## 2024-04-22 MED ORDER — ONDANSETRON HCL 4 MG/2ML IJ SOLN
INTRAMUSCULAR | Status: AC
  Filled 2024-04-22: qty 2

## 2024-04-22 MED ORDER — OXYCODONE HCL 5 MG PO TABS: 5.0000 mg | ORAL_TABLET | ORAL | Status: DC | PRN

## 2024-04-22 MED ORDER — FENTANYL CITRATE (PF) 100 MCG/2ML IJ SOLN
INTRAMUSCULAR | Status: DC | PRN
  Administered 2024-04-22: 100 ug via INTRAVENOUS

## 2024-04-22 MED ORDER — SENNA 8.6 MG PO TABS
1.0000 | ORAL_TABLET | Freq: Two times a day (BID) | ORAL | Status: DC
Start: 2024-04-22 — End: 2024-04-24
  Administered 2024-04-22 – 2024-04-24 (×4): 8.6 mg via ORAL
  Filled 2024-04-22 (×4): qty 1

## 2024-04-22 MED ORDER — METHYLPREDNISOLONE ACETATE 40 MG/ML IJ SUSP
INTRAMUSCULAR | Status: DC | PRN
  Administered 2024-04-22: 40 mg

## 2024-04-22 MED ORDER — HYDROMORPHONE HCL 1 MG/ML IJ SOLN
INTRAMUSCULAR | Status: DC | PRN
  Administered 2024-04-22: .2 mg via INTRAVENOUS
  Administered 2024-04-22: .1 mg via INTRAVENOUS
  Administered 2024-04-22: .2 mg via INTRAVENOUS
  Administered 2024-04-22: .3 mg via INTRAVENOUS
  Administered 2024-04-22: .2 mg via INTRAVENOUS

## 2024-04-22 MED ORDER — ESCITALOPRAM OXALATE 10 MG PO TABS
20.0000 mg | ORAL_TABLET | Freq: Every day | ORAL | Status: DC
  Administered 2024-04-23 – 2024-04-24 (×2): 20 mg via ORAL
  Filled 2024-04-22 (×2): qty 2

## 2024-04-22 MED ORDER — ATORVASTATIN CALCIUM 10 MG PO TABS
10.0000 mg | ORAL_TABLET | Freq: Every day | ORAL | Status: DC
  Administered 2024-04-22 – 2024-04-23 (×2): 10 mg via ORAL
  Filled 2024-04-22 (×2): qty 1

## 2024-04-22 MED ORDER — OXYCODONE HCL 5 MG PO TABS
10.0000 mg | ORAL_TABLET | ORAL | Status: DC | PRN
  Administered 2024-04-22 – 2024-04-24 (×8): 10 mg via ORAL
  Filled 2024-04-22 (×7): qty 2

## 2024-04-22 MED ORDER — PHENYLEPHRINE 80 MCG/ML (10ML) SYRINGE FOR IV PUSH (FOR BLOOD PRESSURE SUPPORT)
PREFILLED_SYRINGE | INTRAVENOUS | Status: DC | PRN
  Administered 2024-04-22: 80 ug via INTRAVENOUS

## 2024-04-22 MED ORDER — NEBIVOLOL HCL 10 MG PO TABS
10.0000 mg | ORAL_TABLET | Freq: Every day | ORAL | Status: DC
  Administered 2024-04-23 – 2024-04-24 (×2): 10 mg via ORAL
  Filled 2024-04-22 (×2): qty 1

## 2024-04-22 MED ORDER — CEFAZOLIN SODIUM-DEXTROSE 2-4 GM/100ML-% IV SOLN
2.0000 g | Freq: Once | INTRAVENOUS | Status: AC
  Administered 2024-04-22: 2 g via INTRAVENOUS

## 2024-04-22 MED ORDER — SODIUM CHLORIDE (PF) 0.9 % IJ SOLN
INTRAMUSCULAR | Status: DC | PRN
  Administered 2024-04-22: 30 mL

## 2024-04-22 MED ORDER — DOCUSATE SODIUM 100 MG PO CAPS
100.0000 mg | ORAL_CAPSULE | Freq: Two times a day (BID) | ORAL | Status: DC
  Administered 2024-04-22 – 2024-04-24 (×4): 100 mg via ORAL
  Filled 2024-04-22 (×4): qty 1

## 2024-04-22 MED ORDER — BUPIVACAINE-EPINEPHRINE (PF) 0.5% -1:200000 IJ SOLN
INTRAMUSCULAR | Status: DC | PRN
  Administered 2024-04-22: 10 mL

## 2024-04-22 MED ORDER — PROPOFOL 10 MG/ML IV BOLUS
INTRAVENOUS | Status: DC | PRN
  Administered 2024-04-22: 200 mg via INTRAVENOUS
  Administered 2024-04-22 (×2): 50 mg via INTRAVENOUS
  Administered 2024-04-22: 50 ug/kg/min via INTRAVENOUS

## 2024-04-22 MED ORDER — AMLODIPINE BESYLATE 5 MG PO TABS
5.0000 mg | ORAL_TABLET | Freq: Every day | ORAL | Status: DC
  Administered 2024-04-23 – 2024-04-24 (×2): 5 mg via ORAL
  Filled 2024-04-22 (×2): qty 1

## 2024-04-22 MED ORDER — SODIUM CHLORIDE 0.9% FLUSH
3.0000 mL | Freq: Two times a day (BID) | INTRAVENOUS | Status: DC
  Administered 2024-04-22 – 2024-04-24 (×4): 3 mL via INTRAVENOUS

## 2024-04-22 MED ORDER — HYDROMORPHONE HCL 1 MG/ML IJ SOLN
1.0000 mg | INTRAMUSCULAR | Status: AC | PRN
  Administered 2024-04-22 – 2024-04-23 (×3): 1 mg via INTRAVENOUS
  Filled 2024-04-22 (×4): qty 1

## 2024-04-22 MED ORDER — HYDROMORPHONE HCL 1 MG/ML IJ SOLN
INTRAMUSCULAR | Status: AC
  Filled 2024-04-22: qty 1

## 2024-04-22 MED ORDER — METHYLPREDNISOLONE ACETATE 40 MG/ML IJ SUSP
INTRAMUSCULAR | Status: AC
  Filled 2024-04-22: qty 1

## 2024-04-22 MED ORDER — PHENYLEPHRINE 80 MCG/ML (10ML) SYRINGE FOR IV PUSH (FOR BLOOD PRESSURE SUPPORT)
PREFILLED_SYRINGE | INTRAVENOUS | Status: AC
  Filled 2024-04-22: qty 10

## 2024-04-22 MED ORDER — ACETAMINOPHEN 10 MG/ML IV SOLN
INTRAVENOUS | Status: AC
  Filled 2024-04-22: qty 100

## 2024-04-22 MED ORDER — ACETAMINOPHEN 10 MG/ML IV SOLN
INTRAVENOUS | Status: DC | PRN
  Administered 2024-04-22: 1000 mg via INTRAVENOUS

## 2024-04-22 MED ORDER — CHLORHEXIDINE GLUCONATE 0.12 % MT SOLN
15.0000 mL | Freq: Once | OROMUCOSAL | Status: AC
  Administered 2024-04-22: 15 mL via OROMUCOSAL

## 2024-04-22 MED ORDER — SPIRONOLACTONE 25 MG PO TABS
50.0000 mg | ORAL_TABLET | Freq: Every day | ORAL | Status: DC
  Administered 2024-04-22 – 2024-04-24 (×3): 50 mg via ORAL
  Filled 2024-04-22 (×3): qty 2

## 2024-04-22 MED ORDER — CHLORHEXIDINE GLUCONATE 0.12 % MT SOLN
OROMUCOSAL | Status: AC
Start: 2024-04-22 — End: 2024-04-22
  Filled 2024-04-22: qty 15

## 2024-04-22 MED ORDER — DEXAMETHASONE SODIUM PHOSPHATE 10 MG/ML IJ SOLN
INTRAMUSCULAR | Status: DC | PRN
  Administered 2024-04-22: 5 mg via INTRAVENOUS

## 2024-04-22 MED ORDER — SODIUM CHLORIDE 0.9 % IV SOLN
250.0000 mL | INTRAVENOUS | Status: AC
  Administered 2024-04-22: 250 mL via INTRAVENOUS

## 2024-04-22 MED ORDER — CEFAZOLIN SODIUM-DEXTROSE 2-4 GM/100ML-% IV SOLN
INTRAVENOUS | Status: AC
  Filled 2024-04-22: qty 100

## 2024-04-22 MED ORDER — POLYETHYLENE GLYCOL 3350 17 G PO PACK: 17.0000 g | PACK | Freq: Every day | ORAL | Status: DC | PRN

## 2024-04-22 MED ORDER — SUCCINYLCHOLINE CHLORIDE 200 MG/10ML IV SOSY
PREFILLED_SYRINGE | INTRAVENOUS | Status: AC
  Filled 2024-04-22: qty 10

## 2024-04-22 MED ORDER — BUPIVACAINE-EPINEPHRINE (PF) 0.5% -1:200000 IJ SOLN
INTRAMUSCULAR | Status: AC
Start: 2024-04-22 — End: 2024-04-22
  Filled 2024-04-22: qty 10

## 2024-04-22 MED ORDER — LIDOCAINE HCL (PF) 2 % IJ SOLN
INTRAMUSCULAR | Status: AC
  Filled 2024-04-22: qty 5

## 2024-04-22 MED ORDER — GABAPENTIN 100 MG PO CAPS
100.0000 mg | ORAL_CAPSULE | Freq: Every day | ORAL | Status: DC
  Administered 2024-04-23: 300 mg via ORAL
  Filled 2024-04-22: qty 1
  Filled 2024-04-22: qty 3

## 2024-04-22 MED ORDER — ORAL CARE MOUTH RINSE: 15.0000 mL | Freq: Once | OROMUCOSAL | Status: AC

## 2024-04-22 MED ORDER — BISACODYL 5 MG PO TBEC: 5.0000 mg | DELAYED_RELEASE_TABLET | Freq: Every day | ORAL | Status: DC | PRN

## 2024-04-22 MED ORDER — SUGAMMADEX SODIUM 200 MG/2ML IV SOLN
INTRAVENOUS | Status: DC | PRN
  Administered 2024-04-22: 200 mg via INTRAVENOUS

## 2024-04-22 MED ORDER — ROCURONIUM BROMIDE 100 MG/10ML IV SOLN
INTRAVENOUS | Status: DC | PRN
  Administered 2024-04-22: 10 mg via INTRAVENOUS
  Administered 2024-04-22: 40 mg via INTRAVENOUS

## 2024-04-22 SURGICAL SUPPLY — 31 items
BASIN KIT SINGLE STR (MISCELLANEOUS) ×1 IMPLANT
BRUSH SCRUB EZ 4% CHG (MISCELLANEOUS) ×1 IMPLANT
BUR NEURO DRILL SOFT 3.0X3.8M (BURR) ×1 IMPLANT
DERMABOND ADVANCED .7 DNX12 (GAUZE/BANDAGES/DRESSINGS) ×1 IMPLANT
DRAPE C-ARM XRAY 36X54 (DRAPES) ×2 IMPLANT
DRAPE LAPAROTOMY 100X77 ABD (DRAPES) ×1 IMPLANT
DRAPE MICROSCOPE SPINE 48X150 (DRAPES) ×1 IMPLANT
DRSG OPSITE POSTOP 3X4 (GAUZE/BANDAGES/DRESSINGS) ×1 IMPLANT
DRSG OPSITE POSTOP 4X6 (GAUZE/BANDAGES/DRESSINGS) IMPLANT
DRSG TEGADERM 4X4.75 (GAUZE/BANDAGES/DRESSINGS) IMPLANT
ELECTRODE EZSTD 165MM 6.5IN (MISCELLANEOUS) ×1 IMPLANT
ELECTRODE REM PT RTRN 9FT ADLT (ELECTROSURGICAL) ×1 IMPLANT
GLOVE BIOGEL PI IND STRL 7.0 (GLOVE) ×1 IMPLANT
GLOVE BIOGEL PI IND STRL 8 (GLOVE) ×2 IMPLANT
GLOVE SURG SYN 7.0 PF PI (GLOVE) ×1 IMPLANT
GLOVE SURG SYN 7.5 PF PI (GLOVE) ×1 IMPLANT
GOWN SRG XL LVL 3 NONREINFORCE (GOWNS) ×1 IMPLANT
GOWN STRL REUS W/ TWL LRG LVL3 (GOWN DISPOSABLE) ×1 IMPLANT
KIT WILSON FRAME (KITS) ×1 IMPLANT
KNIFE BAYONET SHORT DISCETOMY (MISCELLANEOUS) IMPLANT
NDL SAFETY ECLIPSE 18X1.5 (NEEDLE) ×1 IMPLANT
NS IRRIG 500ML POUR BTL (IV SOLUTION) ×1 IMPLANT
PACK LAMINECTOMY ARMC (PACKS) ×1 IMPLANT
PAD ARMBOARD POSITIONER FOAM (MISCELLANEOUS) ×2 IMPLANT
SURGIFLO W/THROMBIN 8M KIT (HEMOSTASIS) ×1 IMPLANT
SUT STRATA 3-0 15 PS-2 (SUTURE) ×1 IMPLANT
SUT VIC AB 0 CT1 27XCR 8 STRN (SUTURE) ×1 IMPLANT
SUT VIC AB 2-0 CT1 18 (SUTURE) ×1 IMPLANT
SYR 30ML LL (SYRINGE) ×2 IMPLANT
SYR 3ML LL SCALE MARK (SYRINGE) ×1 IMPLANT
TRAP FLUID SMOKE EVACUATOR (MISCELLANEOUS) ×1 IMPLANT

## 2024-04-22 NOTE — Anesthesia Preprocedure Evaluation (Signed)
 Anesthesia Evaluation  Patient identified by MRN, date of birth, ID band Patient awake    Reviewed: Allergy & Precautions, H&P , NPO status , Patient's Chart, lab work & pertinent test results, reviewed documented beta blocker date and time   History of Anesthesia Complications Negative for: history of anesthetic complications  Airway Mallampati: I  TM Distance: >3 FB Neck ROM: full    Dental  (+) Dental Advidsory Given, Missing, Teeth Intact   Pulmonary neg pulmonary ROS, former smoker   Pulmonary exam normal breath sounds clear to auscultation       Cardiovascular Exercise Tolerance: Good hypertension, (-) angina (-) Past MI and (-) Cardiac Stents Normal cardiovascular exam(-) dysrhythmias (-) Valvular Problems/Murmurs Rhythm:regular Rate:Normal     Neuro/Psych  PSYCHIATRIC DISORDERS Anxiety Depression    negative neurological ROS     GI/Hepatic negative GI ROS, Neg liver ROS,,,  Endo/Other  diabetes (borderline)    Renal/GU Renal disease (kidney stones)  negative genitourinary   Musculoskeletal   Abdominal   Peds  Hematology negative hematology ROS (+)   Anesthesia Other Findings Past Medical History: No date: Allergy No date: Arthritis     Comment:  hands, left knee No date: Chronic low back pain No date: Depression No date: Dyslipidemia No date: Family history of adverse reaction to anesthesia     Comment:  one sister ponv and slow to awaken No date: GAD (generalized anxiety disorder) No date: History of kidney stones No date: Hyperaldosteronism (HCC)     Comment:  followed by pcp   (abd MRI in epic 10-13-2023 normal) No date: Iron deficiency anemia No date: Malignant hypertension     Comment:  ED visit in epic 09-22-2023 HTN urgency, bp 165/101;                 previously sent to GYN office 09-15-2023 for bp 193/ 115 No date: Panic disorder No date: PMB (postmenopausal bleeding) No date:  Pre-diabetes     Comment:  followed by pcp 06/22/2018: S/P gastric bypass     Comment:  bariatric surgeon ---- dr [email or web address] No date: Seasonal allergies No date: SUI (stress urinary incontinence, female) No date: Uterine fibroid No date: Wears contact lenses No date: Wears glasses   Reproductive/Obstetrics negative OB ROS                              Anesthesia Physical Anesthesia Plan  ASA: 2  Anesthesia Plan: General   Post-op Pain Management:    Induction: Intravenous  PONV Risk Score and Plan: 3 and Ondansetron , Dexamethasone , Treatment may vary due to age or medical condition and Midazolam   Airway Management Planned: Oral ETT  Additional Equipment:   Intra-op Plan:   Post-operative Plan: Extubation in OR  Informed Consent: I have reviewed the patients History and Physical, chart, labs and discussed the procedure including the risks, benefits and alternatives for the proposed anesthesia with the patient or authorized representative who has indicated his/her understanding and acceptance.     Dental Advisory Given  Plan Discussed with: Anesthesiologist, CRNA and Surgeon  Anesthesia Plan Comments:         Anesthesia Quick Evaluation

## 2024-04-22 NOTE — Transfer of Care (Signed)
 Immediate Anesthesia Transfer of Care Note  Patient: Mary Sanford  Procedure(s) Performed: L4-S1 Lumbar Laminectomy and Decompression, Midline Approach (Back)  Patient Location: PACU  Anesthesia Type:General  Level of Consciousness: drowsy and patient cooperative  Airway & Oxygen Therapy: Patient Spontanous Breathing and Patient connected to face mask oxygen  Post-op Assessment: Report given to RN and Post -op Vital signs reviewed and stable  Post vital signs: stable, face mask with O2  Last Vitals:  Vitals Value Taken Time  BP 153/89 04/22/24 16:00  Temp    Pulse 80 04/22/24 16:01  Resp 12 04/22/24 16:01  SpO2 100 % 04/22/24 16:01  Vitals shown include unfiled device data.  Last Pain:  Vitals:   04/22/24 1331  TempSrc: Temporal  PainSc: 0-No pain         Complications: No notable events documented.

## 2024-04-22 NOTE — Anesthesia Procedure Notes (Signed)
 Procedure Name: Intubation Date/Time: 04/22/2024 2:03 PM  Performed by: Veronica Alm BROCKS, CRNAPre-anesthesia Checklist: Patient identified, Patient being monitored, Timeout performed, Emergency Drugs available and Suction available Patient Re-evaluated:Patient Re-evaluated prior to induction Oxygen Delivery Method: Circle system utilized Preoxygenation: Pre-oxygenation with 100% oxygen Induction Type: IV induction Ventilation: Mask ventilation without difficulty Laryngoscope Size: 4 Grade View: Grade I Tube type: Oral Tube size: 7.5 mm Number of attempts: 1 Airway Equipment and Method: Stylet Placement Confirmation: ETT inserted through vocal cords under direct vision, positive ETCO2 and breath sounds checked- equal and bilateral Secured at: 21 cm Tube secured with: Tape Dental Injury: Teeth and Oropharynx as per pre-operative assessment

## 2024-04-22 NOTE — Interval H&P Note (Signed)
 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  The various methods of treatment have been discussed with the patient and family. After consideration of risks, benefits and other options for treatment, the patient has consented to  Procedure(s): L4-S1 Lumbar Laminectomy and Decompression, Midline Approach (N/A) as a surgical intervention.  The patient's history has been reviewed, patient examined, no change in status, stable for surgery.  I have reviewed the patient's chart and labs.  Questions were answered to the patient's satisfaction.    She continues to have signs of lumbar radiculopathy and neurogenic claudication, this correlates well with her L4-5 and L5-S1 stenosis secondary to facet arthropathy and diffuse disc bulging.  We discussed risk and benefits of surgery she like to go forward with surgery.  Will plan for decompression of her lumbar spine with a midline approach from L4-S1.  Will plan for overnight observation.  Heart and lungs are clear.   Mary Sanford

## 2024-04-22 NOTE — Op Note (Signed)
 Indications: 58 year old woman with a history of progressive neurogenic claudication and lumbar radiculopathy who was found to have L4-5 and L5-S1 spinal stenosis, this was refractory to conservative management.  Was taken to the OR for L4-S1 decompression  Findings: Severe overgrowth of her facets causing significant lateral recess stenosis excess connective tissue.  Well decompressed at the end of the procedure  Preoperative Diagnosis:  Lumbar Spondylosis with Neurogenic Claudication and Radiculopathy Postoperative Diagnosis:  Lumbar Spondylosis with Neurogenic Claudication and Radiculopathy   EBL: 20 ml IVF: see anesthesia record Drains: none Disposition: Extubated and Stable to PACU Complications: none  No foley catheter was placed.  Procedure:  L 4/S1 laminectomy, medial facetectomy and lateral recess decompression   Preoperative Note:   Risks of surgery discussed include: infection, bleeding, stroke, coma, death, paralysis, CSF leak, nerve/spinal cord injury, numbness, tingling, weakness, complex regional pain syndrome, recurrent stenosis and/or disc herniation, vascular injury, development of instability, neck/back pain, need for further surgery, persistent symptoms, development of deformity, and the risks of anesthesia. They understood these risks and have agreed to proceed.  Operative Note:    The patient was then brought from the preoperative center with intravenous access established.  The patient underwent general anesthesia and endotracheal tube intubation, then was rotated on the [facility] table where all pressure points were appropriately padded.  An incision was marked with flouroscopy. The skin was then thoroughly cleansed.  Perioperative antibiotic prophylaxis was administered.  Sterile prep and drapes were then applied and a timeout was then observed.    A midline incision was planned over top of the index levels.  The paraspinus muscles were subperiosteally dissected  until the facet was visualized. Flouroscopy was used to confirm the level. A self-retaining retractor was placed.  The microscope was then sterilely brought into the field. Using a high speed drill, the L L4/5 laminectomy and medial facet were drilled until ligamentum flavum was visualized. After the laminoforaminotomy was performed, a curette was used to dissect the ligamentum flavum until the epidural fat and dura were visualized. The ligamentum was then carefully removed with 2mm Kerrison punch and rongeurs.  We then performed the same procedure at the L5-S1 interval.  We removed the remaining L5 lamina piece by piece with a Kerrison rongeur  After the bone was removed and the ligamentum was removed at both interspaces, the tissue in the lateral recesses was resected with Kerrison rongeur.  A majority of this tissue came out with ligamentum flavum initially but the residual tissue was resected.  The central canal and lateral recesses were well decompressed at the end of the procedure.  The L4-5 disc space was examined for any significant amount of disc herniation, there was a small disc bulge but no remaining disc herniation.  We then verified with fluoroscopy that we had decompressed across both levels.  We then obtained meticulous hemostasis.  We irrigated copiously.  Placed Depo-Medrol  along the nerve roots.  We then infiltrated the muscle with long-acting anesthetic  The fascial layer was reapproximated with the use of a 0- Vicryl suture.  Subcutaneous tissue layer was reapproximated using 2-0 Vicryl suture.  The skin was then cleansed and Dermabond was used to close the skin opening.  Patient was then rotated back to the preoperative bed awakened from anesthesia and taken to recovery all counts are correct in this case.  I performed the entire procedure without an assistant surgeon   Penne MICAEL Sharps, MD

## 2024-04-22 NOTE — Plan of Care (Signed)
  Problem: Education: Goal: Knowledge of General Education information will improve Description: Including pain rating scale, medication(s)/side effects and non-pharmacologic comfort measures Outcome: Progressing   Problem: Health Behavior/Discharge Planning: Goal: Ability to manage health-related needs will improve Outcome: Progressing   Problem: Clinical Measurements: Goal: Ability to maintain clinical measurements within normal limits will improve Outcome: Progressing   Problem: Activity: Goal: Risk for activity intolerance will decrease Outcome: Progressing   Problem: Coping: Goal: Level of anxiety will decrease Outcome: Progressing   Problem: Elimination: Goal: Will not experience complications related to bowel motility Outcome: Progressing   Problem: Pain Managment: Goal: General experience of comfort will improve and/or be controlled Outcome: Progressing   Problem: Safety: Goal: Ability to remain free from injury will improve Outcome: Progressing

## 2024-04-22 NOTE — Discharge Instructions (Signed)

## 2024-04-22 NOTE — Brief Op Note (Signed)
 04/22/2024  4:34 PM  PATIENT:  Mary Sanford  58 y.o. female  PRE-OPERATIVE DIAGNOSIS:  Lumbar Spondylosis with Neurogenic Claudication and Radiculopathy  POST-OPERATIVE DIAGNOSIS:  Lumbar Spondylosis with Neurogenic Claudication and Radiculopathy  PROCEDURE:  Procedure(s): L4-S1 Lumbar Laminectomy and Decompression, Midline Approach (N/A)  SURGEON:  Surgeons and Role:    DEWAINE Claudene Penne LELON, MD - Primary  PHYSICIAN ASSISTANT:   ASSISTANTS: none   ANESTHESIA:   general  EBL:  20cc  BLOOD ADMINISTERED:none  DRAINS: none   LOCAL MEDICATIONS USED:  MARCAINE      SPECIMEN:  No Specimen  DISPOSITION OF SPECIMEN:  N/A  COUNTS:  YES  TOURNIQUET:  * No tourniquets in log *  DICTATION: .Dragon Dictation  PLAN OF CARE: Admit for overnight observation  PATIENT DISPOSITION:  PACU - hemodynamically stable.   Delay start of Pharmacological VTE agent (>24hrs) due to surgical blood loss or risk of bleeding: no

## 2024-04-23 DIAGNOSIS — Z809 Family history of malignant neoplasm, unspecified: Secondary | ICD-10-CM | POA: Diagnosis not present

## 2024-04-23 DIAGNOSIS — G8929 Other chronic pain: Secondary | ICD-10-CM | POA: Diagnosis present

## 2024-04-23 DIAGNOSIS — Z83719 Family history of colon polyps, unspecified: Secondary | ICD-10-CM | POA: Diagnosis not present

## 2024-04-23 DIAGNOSIS — M4726 Other spondylosis with radiculopathy, lumbar region: Secondary | ICD-10-CM | POA: Diagnosis present

## 2024-04-23 DIAGNOSIS — M48062 Spinal stenosis, lumbar region with neurogenic claudication: Secondary | ICD-10-CM | POA: Diagnosis present

## 2024-04-23 DIAGNOSIS — R7303 Prediabetes: Secondary | ICD-10-CM | POA: Diagnosis present

## 2024-04-23 DIAGNOSIS — Z800 Family history of malignant neoplasm of digestive organs: Secondary | ICD-10-CM | POA: Diagnosis not present

## 2024-04-23 DIAGNOSIS — Z886 Allergy status to analgesic agent status: Secondary | ICD-10-CM | POA: Diagnosis not present

## 2024-04-23 DIAGNOSIS — Z825 Family history of asthma and other chronic lower respiratory diseases: Secondary | ICD-10-CM | POA: Diagnosis not present

## 2024-04-23 DIAGNOSIS — M48061 Spinal stenosis, lumbar region without neurogenic claudication: Secondary | ICD-10-CM | POA: Diagnosis present

## 2024-04-23 DIAGNOSIS — Z87891 Personal history of nicotine dependence: Secondary | ICD-10-CM | POA: Diagnosis not present

## 2024-04-23 DIAGNOSIS — M19042 Primary osteoarthritis, left hand: Secondary | ICD-10-CM | POA: Diagnosis present

## 2024-04-23 DIAGNOSIS — Z79899 Other long term (current) drug therapy: Secondary | ICD-10-CM | POA: Diagnosis not present

## 2024-04-23 DIAGNOSIS — Z820 Family history of epilepsy and other diseases of the nervous system: Secondary | ICD-10-CM | POA: Diagnosis not present

## 2024-04-23 DIAGNOSIS — I10 Essential (primary) hypertension: Secondary | ICD-10-CM | POA: Diagnosis present

## 2024-04-23 DIAGNOSIS — M4317 Spondylolisthesis, lumbosacral region: Secondary | ICD-10-CM | POA: Diagnosis present

## 2024-04-23 DIAGNOSIS — Z8249 Family history of ischemic heart disease and other diseases of the circulatory system: Secondary | ICD-10-CM | POA: Diagnosis not present

## 2024-04-23 DIAGNOSIS — M4807 Spinal stenosis, lumbosacral region: Secondary | ICD-10-CM | POA: Diagnosis present

## 2024-04-23 DIAGNOSIS — Z833 Family history of diabetes mellitus: Secondary | ICD-10-CM | POA: Diagnosis not present

## 2024-04-23 DIAGNOSIS — Z9884 Bariatric surgery status: Secondary | ICD-10-CM | POA: Diagnosis not present

## 2024-04-23 DIAGNOSIS — M19041 Primary osteoarthritis, right hand: Secondary | ICD-10-CM | POA: Diagnosis present

## 2024-04-23 DIAGNOSIS — E785 Hyperlipidemia, unspecified: Secondary | ICD-10-CM | POA: Diagnosis present

## 2024-04-23 DIAGNOSIS — M1712 Unilateral primary osteoarthritis, left knee: Secondary | ICD-10-CM | POA: Diagnosis present

## 2024-04-23 MED ORDER — POLYETHYLENE GLYCOL 3350 17 G PO PACK
17.0000 g | PACK | Freq: Every day | ORAL | Status: DC
  Administered 2024-04-23 – 2024-04-24 (×2): 17 g via ORAL
  Filled 2024-04-23 (×2): qty 1

## 2024-04-23 MED ORDER — CELECOXIB 100 MG PO CAPS
100.0000 mg | ORAL_CAPSULE | Freq: Two times a day (BID) | ORAL | Status: DC
  Administered 2024-04-23 – 2024-04-24 (×3): 100 mg via ORAL
  Filled 2024-04-23 (×3): qty 1

## 2024-04-23 MED ORDER — TIZANIDINE HCL 4 MG PO TABS
4.0000 mg | ORAL_TABLET | Freq: Three times a day (TID) | ORAL | Status: DC
Start: 2024-04-23 — End: 2024-04-24
  Administered 2024-04-23 – 2024-04-24 (×4): 4 mg via ORAL
  Filled 2024-04-23 (×4): qty 1

## 2024-04-23 NOTE — Evaluation (Signed)
 Occupational Therapy Evaluation Patient Details Name: Mary Sanford MRN: [id number] DOB: 16-Feb-1966 Today's Date: 04/23/2024   History of Present Illness   Pt. is a 58 y.o. female who was admitted to [facility] for L4-L1 Lumbar Laminectomy, and Decompression  2/2 Lumbar spnodylosis, neurogenic claudication,  and radiculopathy, and L4-5, L5-S1 stenosis.     Clinical Impressions Pt. presents with 10/10 pain, and limited reach to the LEs which hinders her ability to complete basic ADL and IADL functioning. Pt. Resides at home with her husband, daughter, and granddaughter. Pt. was independent with ADLs, and IADL functioning: including meal preparation, and medication management. Pt. 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  Pt. Resides in a 2 level home, however plans to bring a twin mattress downstairs, and stay mostly on the 1st floor while she recovers, as there is a 1/2 bath on the main floor. Pain is limited with reaching to her LEs 2/2 pain requiring modA to for LE ADL tasks. Pt. is supervision for self grooming hand hygiene while standing at the sinkside following toileting. Pt. education was provided about A/E use for LE ADLs. Pt. Will benefit from OT services for ADL training, A/E training, UE there. Ex. and Pt./caregiver education about home modification, and DME.     If plan is discharge home, recommend the following:   A little help with walking and/or transfers;A little help with bathing/dressing/bathroom;Assistance with cooking/housework     Functional Status Assessment   Patient has had a recent decline in their functional status and demonstrates the ability to make significant improvements in function in a reasonable and predictable amount of time.     Equipment Recommendations   Tub/shower seat     Recommendations for Other Services         Precautions/Restrictions   Precautions Precautions: Back Required Braces or Orthoses:  (Per orders-No brace  needed.) Restrictions Weight Bearing Restrictions Per Provider Order: No     Mobility Bed Mobility Overal bed mobility: Needs Assistance Bed Mobility: Supine to Sit     Supine to sit: Independent          Transfers Overall transfer level: Needs assistance   Transfers: Sit to/from Stand Sit to Stand: Modified independent (Device/Increase time)                  Balance                                           ADL either performed or assessed with clinical judgement   ADL Overall ADL's : Needs assistance/impaired     Grooming: Supervision/safety           Upper Body Dressing : Independent   Lower Body Dressing: Moderate assistance Lower Body Dressing Details (indicate cue type and reason): 2/2 pain             Functional mobility during ADLs: Supervision/safety       Vision Baseline Vision/History: 1 Wears glasses Patient Visual Report: No change from baseline       Perception         Praxis         Pertinent Vitals/Pain Pain Assessment Pain Assessment: 0-10 Pain Score: 10-Worst pain ever Pain Location: back Pain Descriptors / Indicators:  (Hurting pain) Pain Intervention(s): Premedicated before session     Extremity/Trunk Assessment  Communication Communication Communication: No apparent difficulties   Cognition Arousal: Alert Behavior During Therapy: WFL for tasks assessed/performed Cognition: No apparent impairments                                       Cueing  General Comments          Exercises     Shoulder Instructions      Home Living Family/patient expects to be discharged to:: Private residence Living Arrangements: Spouse/significant other;Children;Other relatives Available Help at Discharge: Family;Available 24 hours/day Type of Home: House Home Access: Level entry (Through the garage)     Home Layout: Two level;Bed/bath upstairs;1/2 bath on main  level     Bathroom Shower/Tub: Walk-in shower         Home Equipment: Agricultural consultant (2 wheels);Cane - single point;BSC/3in1;Crutches          Prior Functioning/Environment Prior Level of Function : Independent/Modified Independent;Working/employed;Driving (Works at [employer] in [employer]             Mobility Comments: Indpendent, has access to a walker, and crutches., ADLs Comments: Independent with ADLs, and IADLs. Pt. was able to drive.    OT Problem List: Decreased knowledge of use of DME or AE;Decreased range of motion   OT Treatment/Interventions:        OT Goals(Current goals can be found in the care plan section)   Acute Rehab OT Goals Patient Stated Goal: To return home OT Goal Formulation: With patient Time For Goal Achievement: 05/13/24 Potential to Achieve Goals: Good   OT Frequency:  Min 2X/week    Co-evaluation              AM-PAC OT 6 Clicks Daily Activity     Outcome Measure Help from another person eating meals?: None Help from another person taking care of personal grooming?: None Help from another person toileting, which includes using toliet, bedpan, or urinal?: A Little Help from another person bathing (including washing, rinsing, drying)?: A Little Help from another person to put on and taking off regular upper body clothing?: None Help from another person to put on and taking off regular lower body clothing?: A Lot 6 Click Score: 20   End of Session Equipment Utilized During Treatment: Gait belt  Activity Tolerance: Patient tolerated treatment well Patient left: in chair;with chair alarm set  OT Visit Diagnosis: Muscle weakness (generalized) (M62.81)                Time: 9059-8981 OT Time Calculation (min): 38 min Charges:  OT General Charges $OT Visit: 1 Visit OT Evaluation $OT Eval Moderate Complexity: 1 Mod OT Treatments $Self Care/Home Management : 8-22 mins Richardson Otter, MS, OTR/L Richardson Otter 04/23/2024, 12:24 PM

## 2024-04-23 NOTE — Plan of Care (Signed)

## 2024-04-23 NOTE — Progress Notes (Signed)
 Attending Progress Note  History: MUSKAN BOLLA is here L4-S1 Lumbar Laminectomy and Decompression, Midline Approach, they are currently 1 Day Post-Op.   POD1: Pain significant, continuing to need IV meds, did well with therapy   Physical Exam: Vitals:   04/23/24 0301 04/23/24 0747  BP: 132/76 (!) 148/87  Pulse: (!) 54 (!) 55  Resp: 18 16  Temp: 97.7 F (36.5 C) 98.2 F (36.8 C)  SpO2: 99% 100%    AA Ox3 CNI  Strength: Pain limited, no clear deficit.   Data:  No results for input(s): NA, K, CL, CO2, BUN, CREATININE, LABGLOM, GLUCOSE, CALCIUM  in the last 168 hours. No results for input(s): AST, ALT, ALKPHOS in the last 168 hours.  Invalid input(s): TBILI   No results for input(s): WBC, HGB, HCT, PLT in the last 168 hours. No results for input(s): APTT, INR in the last 168 hours.      DG Lumbar Spine 2-3 Views Result Date: 04/22/2024 EXAM: 2 or 3 VIEW(S) XRAY OF THE LUMBAR SPINE 04/22/2024 03:37:21 PM COMPARISON: None available. CLINICAL HISTORY: [id number] Surgery, elective [id number]. L4-S1 laminectomy. FINDINGS: 3 fluoroscopic spot views of the lumbar spine submitted from the operating room. Surgical localization at the L4-L5 and upper sacral level. Fluoroscopy time 3 seconds. Dose 2.6116 mg. IMPRESSION: 1. Procedure fluoroscopy during lumbar surgery. Electronically signed by: Andrea Gasman MD 04/22/2024 04:28 PM EDT RP Workstation: [id number]   DG C-Arm 1-60 Min-No Report Result Date: 04/22/2024 Fluoroscopy was utilized by the requesting physician.  No radiographic interpretation.   DG C-Arm 1-60 Min-No Report Result Date: 04/22/2024 Fluoroscopy was utilized by the requesting physician.  No radiographic interpretation.    Other tests/results: none  Assessment/Plan:  BRIELLA HOBDAY is a 58 y.o. female who is here for L4-S1 Lumbar Laminectomy and Decompression, Midline Approach complete on 04/22/2024. She is currently 1 Day Post-Op.     - Drains: none - mobilize - pain control, will dc iv meds at [place] - DVT prophylaxis - PTOT - Likely dc 9/21 AM  Penne MICAEL Sharps, MD/MSCR Department of Neurosurgery

## 2024-04-23 NOTE — Anesthesia Postprocedure Evaluation (Signed)
 Anesthesia Post Note  Patient: Mary Sanford  Procedure(s) Performed: L4-S1 Lumbar Laminectomy and Decompression, Midline Approach (Back)  Patient location during evaluation: PACU Anesthesia Type: General Level of consciousness: awake and alert Pain management: pain level controlled Vital Signs Assessment: post-procedure vital signs reviewed and stable Respiratory status: spontaneous breathing, nonlabored ventilation, respiratory function stable and patient connected to nasal cannula oxygen Cardiovascular status: blood pressure returned to baseline and stable Postop Assessment: no apparent nausea or vomiting Anesthetic complications: no   There were no known notable events for this encounter.   Last Vitals:  Vitals:   04/23/24 0747 04/23/24 1629  BP: (!) 148/87 99/66  Pulse: (!) 55 (!) 59  Resp: 16 17  Temp: 36.8 C 36.6 C  SpO2: 100% 99%    Last Pain:  Vitals:   04/23/24 1750  TempSrc:   PainSc: 10-Worst pain ever                 Prentice Murphy

## 2024-04-23 NOTE — Evaluation (Signed)
 Physical Therapy Evaluation Patient Details Name: Mary Sanford MRN: [id number] DOB: 12-May-1966 Today's Date: 04/23/2024  History of Present Illness  58 y/o s/p L4/S1 laminectomy and decompression on 04/22/24. PMH: prediabetes, iron deficiency anemia, HTN, chronic back pain, depression, s/p gastric bypass  Clinical Impression  Patient admitted following the above procedure. PTA, patient lives with husband and children/grandchildren and was independent with no AD. Educated patient on back precautions with good follow through during session. Able to complete bed mobility modI with use of bed rails and HOB elevated. Stood from EOB and low toilet surface with supervision. Ambulated within room with RW and supervision although not utilizing RW at times. Patient declining hallway ambulation 2/2 reported 10/10 pain in back. Patient will benefit from skilled PT services during acute stay to address listed deficits. Patient will benefit from ongoing therapy at discharge to maximize functional independence and safety.         If plan is discharge home, recommend the following: A little help with bathing/dressing/bathroom;Assistance with cooking/housework;Assist for transportation;Help with stairs or ramp for entrance   Can travel by private vehicle        Equipment Recommendations None recommended by PT  Recommendations for Other Services       Functional Status Assessment Patient has had a recent decline in their functional status and demonstrates the ability to make significant improvements in function in a reasonable and predictable amount of time.     Precautions / Restrictions Precautions Precautions: Back Recall of Precautions/Restrictions: Intact Restrictions Weight Bearing Restrictions Per Provider Order: No      Mobility  Bed Mobility Overal bed mobility: Modified Independent             General bed mobility comments: with use of bed rails and HOB elevated     Transfers Overall transfer level: Needs assistance Equipment used: Rolling Ermal Brzozowski (2 wheels) Transfers: Sit to/from Stand Sit to Stand: Supervision           General transfer comment: increased time, cues for hand placement    Ambulation/Gait Ambulation/Gait assistance: Supervision Gait Distance (Feet): 30 Feet Assistive device: Rolling Shanley Furlough (2 wheels) Gait Pattern/deviations: Step-through pattern, Decreased stride length Gait velocity: decreased     General Gait Details: not utilizing RW at times. Could walk without AD  Stairs            Wheelchair Mobility     Tilt Bed    Modified Rankin (Stroke Patients Only)       Balance Overall balance assessment: Mild deficits observed, not formally tested                                           Pertinent Vitals/Pain Pain Assessment Pain Assessment: 0-10 Pain Score: 10-Worst pain ever Pain Location: back Pain Descriptors / Indicators: Discomfort Pain Intervention(s): Monitored during session, Limited activity within patient's tolerance, Repositioned    Home Living Family/patient expects to be discharged to:: Private residence Living Arrangements: Spouse/significant other;Children;Other relatives Available Help at Discharge: Family;Available 24 hours/day Type of Home: House Home Access: Level entry       Home Layout: Two level;Bed/bath upstairs;1/2 bath on main level (bringing twin bed downstairs) Home Equipment: Rolling Meda Dudzinski (2 wheels);Cane - single point;BSC/3in1;Crutches      Prior Function Prior Level of Function : Independent/Modified Independent;Working/employed;Driving             Mobility Comments: Indpendent,  has access to a Cerena Baine, and crutches., ADLs Comments: Independent with ADLs, and IADLs. Pt. was able to drive.     Extremity/Trunk Assessment   Upper Extremity Assessment Upper Extremity Assessment: Defer to OT evaluation    Lower Extremity  Assessment Lower Extremity Assessment: Generalized weakness    Cervical / Trunk Assessment Cervical / Trunk Assessment: Back Surgery  Communication   Communication Communication: No apparent difficulties    Cognition Arousal: Alert Behavior During Therapy: WFL for tasks assessed/performed   PT - Cognitive impairments: No apparent impairments                         Following commands: Intact       Cueing       General Comments      Exercises     Assessment/Plan    PT Assessment Patient needs continued PT services  PT Problem List Decreased strength;Decreased balance;Decreased activity tolerance;Decreased knowledge of precautions;Decreased knowledge of use of DME       PT Treatment Interventions DME instruction;Gait training;Functional mobility training;Therapeutic activities;Therapeutic exercise;Balance training;Stair training;Neuromuscular re-education;Patient/family education    PT Goals (Current goals can be found in the Care Plan section)  Acute Rehab PT Goals Patient Stated Goal: to go home but not today PT Goal Formulation: With patient Time For Goal Achievement: 05/07/24 Potential to Achieve Goals: Good    Frequency 7X/week     Co-evaluation               AM-PAC PT 6 Clicks Mobility  Outcome Measure Help needed turning from your back to your side while in a flat bed without using bedrails?: None Help needed moving from lying on your back to sitting on the side of a flat bed without using bedrails?: None Help needed moving to and from a bed to a chair (including a wheelchair)?: A Little Help needed standing up from a chair using your arms (e.g., wheelchair or bedside chair)?: A Little Help needed to walk in hospital room?: A Little Help needed climbing 3-5 steps with a railing? : A Little 6 Click Score: 20    End of Session   Activity Tolerance: Patient tolerated treatment well Patient left: in chair;with call bell/phone within  reach (with OT present) Nurse Communication: Mobility status PT Visit Diagnosis: Unsteadiness on feet (R26.81);Muscle weakness (generalized) (M62.81);Difficulty in walking, not elsewhere classified (R26.2)    Time: 9048-8993 PT Time Calculation (min) (ACUTE ONLY): 15 min   Charges:   PT Evaluation $PT Eval Low Complexity: 1 Low   PT General Charges $$ ACUTE PT VISIT: 1 Visit         Maryanne Finder, PT, DPT Physical Therapist - [facility]  [facility]   [place] Cilento A Karron Goens 04/23/2024, 1:03 PM

## 2024-04-24 ENCOUNTER — Other Ambulatory Visit: Payer: Self-pay

## 2024-04-24 ENCOUNTER — Encounter: Payer: Self-pay | Admitting: Neurosurgery

## 2024-04-24 MED ORDER — DOCUSATE SODIUM 100 MG PO CAPS: 100.0000 mg | ORAL_CAPSULE | Freq: Two times a day (BID) | ORAL | 0 refills | Status: DC

## 2024-04-24 MED ORDER — SENNA 8.6 MG PO TABS: 1.0000 | ORAL_TABLET | Freq: Two times a day (BID) | ORAL | 0 refills | Status: DC

## 2024-04-24 MED ORDER — BISACODYL 10 MG RE SUPP
10.0000 mg | Freq: Once | RECTAL | Status: AC
  Administered 2024-04-24: 10 mg via RECTAL
  Filled 2024-04-24: qty 1

## 2024-04-24 MED ORDER — SENNA 8.6 MG PO TABS
1.0000 | ORAL_TABLET | Freq: Two times a day (BID) | ORAL | 0 refills | Status: AC
  Filled 2024-04-24: qty 28, 14d supply, fill #0

## 2024-04-24 MED ORDER — DOCUSATE SODIUM 100 MG PO CAPS
100.0000 mg | ORAL_CAPSULE | Freq: Two times a day (BID) | ORAL | 0 refills | Status: AC
  Filled 2024-04-24: qty 28, 14d supply, fill #0

## 2024-04-24 MED ORDER — OXYCODONE HCL 5 MG PO TABS
10.0000 mg | ORAL_TABLET | Freq: Four times a day (QID) | ORAL | 0 refills | Status: AC | PRN
  Filled 2024-04-24: qty 56, 7d supply, fill #0

## 2024-04-24 MED ORDER — OXYCODONE HCL 5 MG PO TABS: 10.0000 mg | ORAL_TABLET | Freq: Four times a day (QID) | ORAL | 0 refills | Status: DC | PRN

## 2024-04-24 NOTE — Plan of Care (Signed)

## 2024-04-24 NOTE — Plan of Care (Signed)

## 2024-04-24 NOTE — Discharge Summary (Signed)
 [facility] Discharge Summary   Patient: Mary Sanford FMW:[id number] DOB: [birth year] PCP: Joshua Debby CROME, MD  Date of admission: 04/22/2024  Date of Surgery: 04/22/2024  Surgery: Procedure(s) (LRB): L4-S1 Lumbar Laminectomy and Decompression, Midline Approach (N/A)  Date of discharge:  2 Days Post-Op  Discharge Condition: Fair  Recommendations at discharge: {Tip this will not be part of the note when signed- Example include specific recommendations for outpatient follow-up, pending tests to follow-up on. (Optional):26781} Follow up as scheduled  Discharge Diagnoses: Principal Problem:   Lumbar stenosis Active Problems:   Spinal stenosis at L4-L5 level   Spondylolisthesis at L5-S1 level   Spinal stenosis, lumbar region, with neurogenic claudication  Resolved Problems:   * No resolved hospital problems. [facility] Course: The patient underwent L4-S1 Lumbar Laminectomy and Decompression, Midline Approach  Lumbar stenosis,   Spinal stenosis at L4-L5 level, Spondylolisthesis at L5-S1 level with neurogenic claudication.  They were admitted to the general care floor postoperatively.  Had difficulty with pain control on postoperative day 1 and was admitted for further pain control.  On postoperative day 2 she states that she had not had a bowel movement since Thursday so was given suppository.  She was previously on scheduled stool softeners and laxatives.  She worked well with physical therapy and was cleared for home with home health.  She was discharged after a bowel movement.    Consultants: Physical Occupational Therapy Additional Procedures: None  Exam: Vitals:   04/24/24 0751  BP: (!) 156/96  Pulse: (!) 56  Resp: 17  Temp: 98.3 F (36.8 C)  SpO2: 100%   The physical exam is generally normal. Patient appears well, alert and oriented x 3, pleasant, cooperative. Vitals are as noted. Abdomen is soft, no tenderness. Extremities are normal.   Neurologic exam is  at her baseline.  No major deficits.  Imaging Studies: DG Lumbar Spine 2-3 Views Result Date: 04/22/2024 EXAM: 2 or 3 VIEW(S) XRAY OF THE LUMBAR SPINE 04/22/2024 03:37:21 PM COMPARISON: None available. CLINICAL HISTORY: [id number] Surgery, elective [id number]. L4-S1 laminectomy. FINDINGS: 3 fluoroscopic spot views of the lumbar spine submitted from the operating room. Surgical localization at the L4-L5 and upper sacral level. Fluoroscopy time 3 seconds. Dose 2.6116 mg. IMPRESSION: 1. Procedure fluoroscopy during lumbar surgery. Electronically signed by: Andrea Gasman MD 04/22/2024 04:28 PM EDT RP Workstation: [id number]   DG C-Arm 1-60 Min-No Report Result Date: 04/22/2024 Fluoroscopy was utilized by the requesting physician.  No radiographic interpretation.   DG C-Arm 1-60 Min-No Report Result Date: 04/22/2024 Fluoroscopy was utilized by the requesting physician.  No radiographic interpretation.   MM 3D SCREENING MAMMOGRAM BILATERAL BREAST Result Date: 04/01/2024 CLINICAL DATA:  Screening. EXAM: DIGITAL SCREENING BILATERAL MAMMOGRAM WITH TOMOSYNTHESIS AND CAD TECHNIQUE: Bilateral screening digital craniocaudal and mediolateral oblique mammograms were obtained. Bilateral screening digital breast tomosynthesis was performed. The images were evaluated with computer-aided detection. COMPARISON:  Previous exam(s). ACR Breast Density Category b: There are scattered areas of fibroglandular density. FINDINGS: There are no findings suspicious for malignancy. IMPRESSION: No mammographic evidence of malignancy. A result letter of this screening mammogram will be mailed directly to the patient. RECOMMENDATION: Screening mammogram in one year. (Code:SM-B-01Y) BI-RADS CATEGORY  1: Negative. Electronically Signed   By: Dina  Arceo M.D.   On: 04/01/2024 14:22   DG Lumbar Spine Complete Result Date: 03/30/2024 CLINICAL DATA:  Spinal stenosis, chronic pain radiating to the lower legs EXAM: LUMBAR SPINE - COMPLETE 4+ VIEW  COMPARISON:  None Available. FINDINGS:  No fracture or dislocation of the lumbar spine. Normal lumbar lordosis. No dynamic listhesis on flexion and extension views. Minimal multilevel endplate change with preserved disc space heights. Mild facet degenerative change of the lower lumbar levels. Nonobstructive pattern of overlying bowel gas. IMPRESSION: 1. No fracture or dislocation of the lumbar spine. Normal lumbar lordosis. No dynamic listhesis on flexion and extension views. 2. Minimal multilevel endplate change with preserved disc space heights. Mild facet degenerative change of the lower lumbar levels. Electronically Signed   By: Marolyn JONETTA Jaksch M.D.   On: 03/30/2024 13:36    Microbiology: Results for orders placed or performed during the hospital encounter of 04/13/24  Surgical pcr screen     Status: None   Collection Time: 04/13/24  9:44 AM   Specimen: Nasal Mucosa; Nasal Swab  Result Value Ref Range Status   MRSA, PCR NEGATIVE NEGATIVE Final   Staphylococcus aureus NEGATIVE NEGATIVE Final    Comment: (NOTE) The Xpert SA Assay (FDA approved for NASAL specimens in patients 6 years of age and older), is one component of a comprehensive surveillance program. It is not intended to diagnose infection nor to guide or monitor treatment. Performed at [facility], [address] [place], [place] [address]     Labs:  CBC: No results for input(s): WBC, NEUTROABS, HGB, HCT, MCV, PLT in the last 168 hours. Basic Metabolic Panel: No results for input(s): NA, K, CL, CO2, GLUCOSE, BUN, CREATININE, CALCIUM , MG, PHOS in the last 168 hours. CBG: No results for input(s): GLUCAP in the last 168 hours.  Time spent: 35 minutes  Author: Penne LELON Sharps, MD  [facility]

## 2024-04-24 NOTE — TOC CM/SW Note (Addendum)
..  Transition of Care [facility]) - Inpatient Brief Assessment   Patient Details  Name: Mary Sanford MRN: [id number] Date of Birth: 07-Feb-1966  Transition of Care [facility] CM/SW Contact:    [name], LCSW Phone Number: 04/24/2024, 11:31 AM   Clinical Narrative:  TOC met with pt at bedside.  Pt stated that she lives with her husband, children and grandchildren.  No safety or DV concerns.  PCP: Dr. Debby and Pharmacy: [facility].  Pt stated that she has not used HH in the past.  SW asked if pt has a preference in [facility] provider.  Pt stated no. TOC asked if pt would like for TOC to pick [facility] provider that accepts pt insurance.  Pt agreed.  DME: walker, beside commode and lift chair.  Pt stated that her husband will pick her up once ready for discharge.  No financial issues or issues affording medications.  ADLs/IADLs: Pt stated that it is difficult for her to lift herself up since surgery.  CPAP/OXYGEN/DIALYSIS: NO.  Veteran: no.  Pt did expressed concerns about not having bowel movement since Last Thursday and not feeling comfortable going home without bowel movement. TOC notified Dr. Claudene of pt concern.  TOC reached out to [facility] for [facility] referral.   5:06pm.  TOC message MD regarding DME. No order was placed for Tub/shower seat.  TOC waiting on response from MD.  [facility] message [employer] regarding DME and to have it delivered once order is obtained.   Transition of Care Asessment:

## 2024-04-24 NOTE — Progress Notes (Signed)
 Reviewed discharge instructions with patient and husband. Patient and husband acknowledged understanding. Patient received meds to bedside. Patient discharged with personal belongings. Patient wheeled out by staff. Patient transported home via family vehicle. No distress noted in patient.

## 2024-04-24 NOTE — Progress Notes (Signed)
 Attending Progress Note  History: Mary Sanford is here L4-S1 Lumbar Laminectomy and Decompression, Midline Approach, they are currently 2 Days Post-Op.   POD2: Stopped IV meds. Worked with therapy. Awaiting BM. Suppository Ordered.  POD1: Pain significant, continuing to need IV meds, did well with therapy   Physical Exam: Vitals:   04/24/24 0434 04/24/24 0751  BP: (!) 142/92 (!) 156/96  Pulse: (!) 54 (!) 56  Resp: 16 17  Temp: (!) 97.4 F (36.3 C) 98.3 F (36.8 C)  SpO2: 99% 100%    AA Ox3 CNI  Strength: Pain limited, no clear deficit.   Data:  No results for input(s): NA, K, CL, CO2, BUN, CREATININE, LABGLOM, GLUCOSE, CALCIUM  in the last 168 hours. No results for input(s): AST, ALT, ALKPHOS in the last 168 hours.  Invalid input(s): TBILI   No results for input(s): WBC, HGB, HCT, PLT in the last 168 hours. No results for input(s): APTT, INR in the last 168 hours.      DG Lumbar Spine 2-3 Views Result Date: 04/22/2024 EXAM: 2 or 3 VIEW(S) XRAY OF THE LUMBAR SPINE 04/22/2024 03:37:21 PM COMPARISON: None available. CLINICAL HISTORY: [id number] Surgery, elective [id number]. L4-S1 laminectomy. FINDINGS: 3 fluoroscopic spot views of the lumbar spine submitted from the operating room. Surgical localization at the L4-L5 and upper sacral level. Fluoroscopy time 3 seconds. Dose 2.6116 mg. IMPRESSION: 1. Procedure fluoroscopy during lumbar surgery. Electronically signed by: Andrea Gasman MD 04/22/2024 04:28 PM EDT RP Workstation: [id number]   DG C-Arm 1-60 Min-No Report Result Date: 04/22/2024 Fluoroscopy was utilized by the requesting physician.  No radiographic interpretation.   DG C-Arm 1-60 Min-No Report Result Date: 04/22/2024 Fluoroscopy was utilized by the requesting physician.  No radiographic interpretation.    Other tests/results: none  Assessment/Plan:  Mary Sanford is a 58 y.o. female who is here for L4-S1 Lumbar Laminectomy and  Decompression, Midline Approach complete on 04/22/2024. She is currently 2 Days Post-Op.    - Drains: none - mobilize - pain control - DVT prophylaxis - PTOT - Awaiting for BM prior to DC  Penne MICAEL Sharps, MD/MSCR Department of Neurosurgery

## 2024-04-24 NOTE — Progress Notes (Signed)
 Physical Therapy Treatment Patient Details Name: Mary Sanford MRN: [id number] DOB: Dec 17, 1965 Today's Date: 04/24/2024   History of Present Illness 58 y/o s/p L4/S1 laminectomy and decompression on 04/22/24. PMH: prediabetes, iron deficiency anemia, HTN, chronic back pain, depression, s/p gastric bypass    PT Comments  Pt ready for session.  To EOB with supervision.  Stands with cga x 1 and is able to progress gait 70' today with RW and cga/supervision.  Back pain remains barrier and reports shaky legs which causes her to turn back to room.  She does opt to remain up in room.  Overall progressing with mobility.  Stated she has no steps into home through garage and plans to sleep downstairs initially but is hesitant to return home at this time I don't think I am ready stating she hopes to stay another night.   If plan is discharge home, recommend the following: A little help with bathing/dressing/bathroom;Assistance with cooking/housework;Assist for transportation;Help with stairs or ramp for entrance   Can travel by private vehicle        Equipment Recommendations  None recommended by PT    Recommendations for Other Services       Precautions / Restrictions Precautions Precautions: Back Recall of Precautions/Restrictions: Intact Restrictions Weight Bearing Restrictions Per Provider Order: No     Mobility  Bed Mobility Overal bed mobility: Modified Independent Bed Mobility: Supine to Sit             Patient Response: Cooperative  Transfers Overall transfer level: Needs assistance Equipment used: Rolling walker (2 wheels) Transfers: Sit to/from Stand Sit to Stand: Contact guard assist           General transfer comment: increased time, cues for hand placement    Ambulation/Gait Ambulation/Gait assistance: Supervision, Contact guard assist Gait Distance (Feet): 50 Feet Assistive device: Rolling walker (2 wheels) Gait Pattern/deviations: Step-through  pattern, Decreased stride length Gait velocity: decreased     General Gait Details: relied on RW today   Stairs             Wheelchair Mobility     Tilt Bed Tilt Bed Patient Response: Cooperative  Modified Rankin (Stroke Patients Only)       Balance Overall balance assessment: Mild deficits observed, not formally tested                                          Communication Communication Communication: No apparent difficulties  Cognition Arousal: Alert Behavior During Therapy: WFL for tasks assessed/performed   PT - Cognitive impairments: No apparent impairments                         Following commands: Intact      Cueing Cueing Techniques: Verbal cues  Exercises      General Comments        Pertinent Vitals/Pain Pain Assessment Pain Assessment: Faces Faces Pain Scale: Hurts even more Pain Location: back Pain Descriptors / Indicators: Discomfort Pain Intervention(s): Limited activity within patient's tolerance, Monitored during session, Repositioned    Home Living                          Prior Function            PT Goals (current goals can now be found in the care plan section) Progress  towards PT goals: Progressing toward goals    Frequency    7X/week      PT Plan      Co-evaluation              AM-PAC PT 6 Clicks Mobility   Outcome Measure  Help needed turning from your back to your side while in a flat bed without using bedrails?: None Help needed moving from lying on your back to sitting on the side of a flat bed without using bedrails?: None Help needed moving to and from a bed to a chair (including a wheelchair)?: A Little Help needed standing up from a chair using your arms (e.g., wheelchair or bedside chair)?: A Little Help needed to walk in hospital room?: A Little Help needed climbing 3-5 steps with a railing? : A Little 6 Click Score: 20    End of Session    Activity Tolerance: Patient tolerated treatment well Patient left: in chair;with call bell/phone within reach (with OT present) Nurse Communication: Mobility status PT Visit Diagnosis: Unsteadiness on feet (R26.81);Muscle weakness (generalized) (M62.81);Difficulty in walking, not elsewhere classified (R26.2)     Time: 9067-9054 PT Time Calculation (min) (ACUTE ONLY): 13 min  Charges:    $Gait Training: 8-22 mins PT General Charges $$ ACUTE PT VISIT: 1 Visit                   Lauraine Gills, PTA 04/24/24, 9:53 AM

## 2024-05-04 ENCOUNTER — Encounter: Payer: Self-pay | Admitting: Physician Assistant

## 2024-05-04 ENCOUNTER — Ambulatory Visit: Admitting: Physician Assistant

## 2024-05-04 VITALS — BP 90/62 | Temp 98.4°F | Ht 69.0 in | Wt 196.0 lb

## 2024-05-04 DIAGNOSIS — M48062 Spinal stenosis, lumbar region with neurogenic claudication: Secondary | ICD-10-CM

## 2024-05-04 DIAGNOSIS — Z09 Encounter for follow-up examination after completed treatment for conditions other than malignant neoplasm: Secondary | ICD-10-CM

## 2024-05-04 MED ORDER — GABAPENTIN 300 MG PO CAPS: 300.0000 mg | ORAL_CAPSULE | Freq: Three times a day (TID) | ORAL | 2 refills | Status: DC

## 2024-05-04 MED ORDER — OXYCODONE HCL 5 MG PO TABS: 5.0000 mg | ORAL_TABLET | Freq: Four times a day (QID) | ORAL | 0 refills | Status: DC | PRN

## 2024-05-04 MED ORDER — METHYLPREDNISOLONE 4 MG PO TBPK: ORAL_TABLET | ORAL | 0 refills | Status: DC

## 2024-05-04 NOTE — Progress Notes (Signed)
   REFERRING PHYSICIAN:  Joshua Debby CROME, Md [address] [place],  [place] [address]  DOS: 04/22/24, L4- S1 lumbar lami and decompression  HISTORY OF PRESENT ILLNESS: Mary Sanford is approximately 2 weeks status post L4 S1 lumbar Lami and decompression. she unfortunately has had some worse pain since surgery she states that she is taking meds around-the-clock.  Pain primarily shoots down to left lower extremity.  She is having quite a bit of muscle spasms in both legs, left worse than right.  She is currently using a walker at home.    PHYSICAL EXAMINATION:  General: Patient is well developed, well nourished, calm, collected, and in no apparent distress.   NEUROLOGICAL:  General: In no acute distress.   Awake, alert, oriented to person, place, and time.  Pupils equal round and reactive to light.  Facial tone is symmetric.     Strength:  Somewhat limited strength exam secondary to patient's pain, however patient has at least a 4+/5 throughout bilateral lower extremities             Incision c/d/i   ROS (Neurologic):  Negative except as noted above  IMAGING: No new imaging since surgery  ASSESSMENT/PLAN:  Mary Sanford is doing okay approximately 2 weeks after L4 S1 lumbar Lami and decompression.  Unfortunately she is in quite a bit of pain.  Plan for Medrol  dose pack, increase gabapentin .  Offered to patient to change muscle relaxer but she would like to hold at this time.  Oxycodone  was refilled.I have advised the patient to lift up to 10 pounds until 6 weeks after surgery, then increase up to 25 pounds until 12 weeks after surgery.  After 12 weeks post-op, the patient advised to increase activity as tolerated.  Red flag symptoms were reviewed with the patient.  Encouraged to reach out to us  if her pain gets worse or if she has any other questions or concerns.  Advised to contact the office if any questions or concerns arise.  Lyle Decamp PA-C Department of  neurosurgery

## 2024-05-11 ENCOUNTER — Telehealth: Payer: Self-pay | Admitting: Physician Assistant

## 2024-05-11 ENCOUNTER — Other Ambulatory Visit: Payer: Self-pay | Admitting: Physician Assistant

## 2024-05-11 MED ORDER — OXYCODONE HCL 5 MG PO TABS: 5.0000 mg | ORAL_TABLET | Freq: Four times a day (QID) | ORAL | 0 refills | Status: DC | PRN

## 2024-05-11 NOTE — Telephone Encounter (Signed)
 Date: 05/12/24 Provider: [facility] Requesting: oxyCODONE  (ROXICODONE ) 5 MG immediate release tablet  Ph[facility]acy: [facility] [place], [place]  Patient contact: [phone]

## 2024-05-18 ENCOUNTER — Ambulatory Visit: Admitting: Internal Medicine

## 2024-05-18 ENCOUNTER — Other Ambulatory Visit: Payer: Self-pay | Admitting: Physician Assistant

## 2024-05-18 ENCOUNTER — Telehealth: Payer: Self-pay

## 2024-05-18 ENCOUNTER — Telehealth: Payer: Self-pay | Admitting: Physician Assistant

## 2024-05-18 DIAGNOSIS — M48062 Spinal stenosis, lumbar region with neurogenic claudication: Secondary | ICD-10-CM

## 2024-05-18 MED ORDER — OXYCODONE HCL 5 MG PO TABS: 5.0000 mg | ORAL_TABLET | Freq: Four times a day (QID) | ORAL | 0 refills | Status: DC | PRN

## 2024-05-18 NOTE — Telephone Encounter (Signed)
 Pt is calling to inform us  of tender round nodules under her incision site. using a lot of medication / not warm or infected. but seems swollen. Daughter sent photos to add to her chart, sent to Kendelyn to check.

## 2024-05-18 NOTE — Telephone Encounter (Addendum)
 Mary Sanford

## 2024-05-18 NOTE — Telephone Encounter (Signed)
 Message sent to [facility], no authorization is required.

## 2024-05-18 NOTE — Telephone Encounter (Signed)
 I spoke with the patient about Mary Sanford's recommendations. She would like the MRI to be done at [facility]. I provided her with the phone # to [facility]. She is scheduled to see Dr Claudene on 06/01/24.

## 2024-05-18 NOTE — Telephone Encounter (Signed)
 I spoke with the patient and her daughter. She reports pain across her back like before. The bumps are tender. When she lays down, she immediately gets a headache and it is painful to lay down because her lower back hurts. The pain is radiating down both legs, left worse than right. She also feels her balance is off. She is always uncomfortable and has to take pain pills every 4 hours. She is in pain if she misses a dose or goes past 4 hours.  She states Brooke told her to take 10mg  of oxycodone , but only sent in 30 tablets of 5mg  pills. She is out of the oxycodone .   She is still taking: Tylenol  and gabapentin  every 4 hours  She alternates oxycodone  or tizanidine  every 4 hours Meloxicam  7.5 daily  She finished the medrol  dosepack, but it did not help.  She is not having any new symptoms since her appointment on 05/04/24 except for the positional headaches. The headache is only when she lays down and goes away within 5 minutes after sitting/standing up.  She is requesting an MRI. She states if she cannot get more comfortable, she will go to the ER.   We discussed that her amlodipine  should come from her PCP.   [employer]

## 2024-05-18 NOTE — Telephone Encounter (Signed)
 Date10/15/25 Provider FRISBIE Med Requesting Amlodipine  5mg  Pharmacy  [id number] [place] - [address]   Patient contact [phone]

## 2024-05-23 ENCOUNTER — Ambulatory Visit: Admitting: Internal Medicine

## 2024-05-23 ENCOUNTER — Ambulatory Visit (INDEPENDENT_AMBULATORY_CARE_PROVIDER_SITE_OTHER)

## 2024-05-23 VITALS — BP 140/96 | HR 72 | Temp 98.4°F | Resp 16 | Ht 69.0 in | Wt 197.8 lb

## 2024-05-23 DIAGNOSIS — Z23 Encounter for immunization: Secondary | ICD-10-CM | POA: Diagnosis not present

## 2024-05-23 DIAGNOSIS — M48061 Spinal stenosis, lumbar region without neurogenic claudication: Secondary | ICD-10-CM | POA: Diagnosis not present

## 2024-05-23 DIAGNOSIS — G8929 Other chronic pain: Secondary | ICD-10-CM

## 2024-05-23 DIAGNOSIS — M5442 Lumbago with sciatica, left side: Secondary | ICD-10-CM | POA: Diagnosis not present

## 2024-05-23 DIAGNOSIS — M4317 Spondylolisthesis, lumbosacral region: Secondary | ICD-10-CM

## 2024-05-23 DIAGNOSIS — M5441 Lumbago with sciatica, right side: Secondary | ICD-10-CM

## 2024-05-23 DIAGNOSIS — I10 Essential (primary) hypertension: Secondary | ICD-10-CM | POA: Diagnosis not present

## 2024-05-23 DIAGNOSIS — D510 Vitamin B12 deficiency anemia due to intrinsic factor deficiency: Secondary | ICD-10-CM | POA: Diagnosis not present

## 2024-05-23 DIAGNOSIS — Z79891 Long term (current) use of opiate analgesic: Secondary | ICD-10-CM | POA: Insufficient documentation

## 2024-05-23 DIAGNOSIS — M545 Low back pain, unspecified: Secondary | ICD-10-CM

## 2024-05-23 LAB — CBC WITH DIFFERENTIAL/PLATELET
Basophils Absolute: 0.1 K/uL (ref 0.0–0.1)
Basophils Relative: 1.1 % (ref 0.0–3.0)
Eosinophils Absolute: 0.8 K/uL — ABNORMAL HIGH (ref 0.0–0.7)
Eosinophils Relative: 13.5 % — ABNORMAL HIGH (ref 0.0–5.0)
HCT: 39.4 % (ref 36.0–46.0)
Hemoglobin: 12.4 g/dL (ref 12.0–15.0)
Lymphocytes Relative: 42.5 % (ref 12.0–46.0)
Lymphs Abs: 2.5 K/uL (ref 0.7–4.0)
MCHC: 31.4 g/dL (ref 30.0–36.0)
MCV: 74.3 fl — ABNORMAL LOW (ref 78.0–100.0)
Monocytes Absolute: 0.8 K/uL (ref 0.1–1.0)
Monocytes Relative: 13.3 % — ABNORMAL HIGH (ref 3.0–12.0)
Neutro Abs: 1.7 K/uL (ref 1.4–7.7)
Neutrophils Relative %: 29.6 % — ABNORMAL LOW (ref 43.0–77.0)
Platelets: 194 K/uL (ref 150.0–400.0)
RBC: 5.3 Mil/uL — ABNORMAL HIGH (ref 3.87–5.11)
RDW: 15 % (ref 11.5–15.5)
WBC: 5.8 K/uL (ref 4.0–10.5)

## 2024-05-23 LAB — C-REACTIVE PROTEIN: CRP: <0.5 mg/dL (ref 0.5–20.0)

## 2024-05-23 LAB — SEDIMENTATION RATE: Sed Rate: 13 mm/h (ref 0–30)

## 2024-05-23 MED ORDER — NALOXONE HCL 4 MG/0.1ML NA LIQD: 1.0000 | Freq: Once | NASAL | 3 refills | Status: AC

## 2024-05-23 MED ORDER — AMLODIPINE BESYLATE 10 MG PO TABS: 10.0000 mg | ORAL_TABLET | Freq: Every day | ORAL | 0 refills | Status: DC

## 2024-05-23 MED ORDER — OXYCODONE-ACETAMINOPHEN 10-325 MG PO TABS: 1.0000 | ORAL_TABLET | Freq: Three times a day (TID) | ORAL | 0 refills | Status: DC | PRN

## 2024-05-23 NOTE — Progress Notes (Unsigned)
 Subjective:  Patient ID: Mary Sanford, female    DOB: 10/24/1965  Age: 58 y.o. MRN: [id number]  CC: Medical Management of Chronic Issues (3 month follow up )   HPI Mary Sanford presents for f/up   --   Discussed the use of AI scribe software for clinical note transcription with the patient, who gave verbal consent to proceed.  History of Present Illness Mary Sanford is a 58 year old female who presents with severe post-surgical low back pain radiating to both legs.  She underwent surgery approximately four weeks ago and has since been experiencing severe low back pain that radiates to both legs, with the left leg being more affected. The pain is constant and has not improved since the surgery. It is so severe that it affects her ability to find a comfortable position, whether lying down or standing, and sometimes causes her to fall back down when standing.  The pain is accompanied by swelling and 'knots' in her back, which she believes may be fluid. The pain is located where the swelling is, and it shoots down her buttocks. No numbness, weakness, or tingling in her legs. No fever, chills, or bowel movement issues, but she reports experiencing dizziness and lightheadedness.  For pain management, she is taking codeine, tizanidine , Tylenol , and oxycodone . Oxycodone  helps her sleep and perform activities without needing her walker. She is concerned about running out of oxycodone  soon.  She uses a walker for mobility.      Outpatient Medications Prior to Visit  Medication Sig Dispense Refill   atorvastatin  (LIPITOR) 10 MG tablet Take 1 tablet by mouth once daily 90 tablet 1   escitalopram  (LEXAPRO ) 20 MG tablet Take 1 tablet by mouth once daily 90 tablet 0   gabapentin  (NEURONTIN ) 300 MG capsule Take 1 capsule (300 mg total) by mouth 3 (three) times daily. 90 capsule 2   meloxicam  (MOBIC ) 7.5 MG tablet Take 1 tablet (7.5 mg total) by mouth daily. 30 tablet 2   Misc Natural  Products (BEET ROOT PO) Take 2 tablets by mouth daily.     nebivolol  (BYSTOLIC ) 10 MG tablet Take 1 tablet by mouth once daily 90 tablet 0   spironolactone  (ALDACTONE ) 50 MG tablet Take 1 tablet by mouth once daily 90 tablet 0   tiZANidine  (ZANAFLEX ) 2 MG tablet Take 1-2 tablets (2-4 mg total) by mouth every 8 (eight) hours as needed for muscle spasms. 60 tablet 2   acetaminophen  (TYLENOL ) 500 MG tablet Take 1-2 tablets (500-1,000 mg total) by mouth as needed for headache or moderate pain (pain score 4-6). 120 tablet 1   amLODipine  (NORVASC ) 5 MG tablet Take 1 tablet (5 mg total) by mouth daily. Take 1 tablet by mouth once daily. Patient needs to follow up with PCP prior to future refills 60 tablet 0   methylPREDNISolone  (MEDROL  DOSEPAK) 4 MG TBPK tablet Take by mouth daily, taper daily dose per package instructions. 21 tablet 0   oxyCODONE  (ROXICODONE ) 5 MG immediate release tablet Take 1 tablet (5 mg total) by mouth every 6 (six) hours as needed. 30 tablet 0   No facility-administered medications prior to visit.    ROS Review of Systems  Constitutional:  Negative for appetite change, chills, diaphoresis, fatigue and fever.  HENT: Negative.    Eyes: Negative.   Respiratory: Negative.  Negative for cough, chest tightness, shortness of breath and wheezing.   Cardiovascular:  Negative for chest pain, palpitations and leg swelling.  Gastrointestinal: Negative.  Negative for abdominal pain, constipation, diarrhea, nausea and vomiting.  Genitourinary: Negative.  Negative for difficulty urinating.  Musculoskeletal:  Positive for back pain and gait problem. Negative for joint swelling.  Skin: Negative.   Neurological:  Negative for dizziness, weakness, light-headedness and headaches.  Hematological:  Negative for adenopathy. Does not bruise/bleed easily.  Psychiatric/Behavioral: Negative.      Objective:  BP (!) 140/96 (BP Location: Left Arm, Patient Position: Sitting, Cuff Size: Normal)    Pulse 72   Temp 98.4 F (36.9 C) (Oral)   Resp 16   Ht 5' 9 (1.753 m)   Wt 197 lb 12.8 oz (89.7 kg)   LMP 05/15/2021 (Approximate)   SpO2 97%   BMI 29.21 kg/m   BP Readings from Last 3 Encounters:  05/23/24 (!) 140/96  05/04/24 90/62  04/24/24 (!) 156/96    Wt Readings from Last 3 Encounters:  05/23/24 197 lb 12.8 oz (89.7 kg)  05/04/24 196 lb (88.9 kg)  04/22/24 194 lb 0.1 oz (88 kg)    Physical Exam Vitals reviewed.  Constitutional:      Appearance: Normal appearance.  HENT:     Mouth/Throat:     Mouth: Mucous membranes are moist.  Eyes:     General: No scleral icterus. Cardiovascular:     Rate and Rhythm: Normal rate and regular rhythm.     Heart sounds: No murmur heard.    No friction rub. No gallop.  Pulmonary:     Effort: Pulmonary effort is normal.     Breath sounds: No stridor. No wheezing, rhonchi or rales.  Abdominal:     General: Abdomen is flat.     Palpations: There is no mass.     Tenderness: There is no abdominal tenderness. There is no guarding.     Hernia: No hernia is present.  Musculoskeletal:     Cervical back: Neck supple.     Lumbar back: Swelling, deformity, signs of trauma and tenderness present. No edema or bony tenderness. Decreased range of motion. Negative right straight leg raise test and negative left straight leg raise test.       Back:     Right lower leg: No edema.     Left lower leg: No edema.  Lymphadenopathy:     Cervical: No cervical adenopathy.  Skin:    General: Skin is warm and dry.  Neurological:     General: No focal deficit present.     Mental Status: She is alert.     Deep Tendon Reflexes: Reflexes normal.  Psychiatric:        Mood and Affect: Mood normal.        Behavior: Behavior normal.     Lab Results  Component Value Date   WBC 5.8 05/23/2024   HGB 12.4 05/23/2024   HCT 39.4 05/23/2024   PLT 194.0 05/23/2024   GLUCOSE 92 04/13/2024   CHOL 121 09/10/2023   TRIG 263.0 (H) 09/10/2023   HDL 57.70  09/10/2023   LDLCALC 11 09/10/2023   ALT 45 (H) 02/08/2024   AST 41 02/08/2024   NA 142 04/13/2024   K 4.0 04/13/2024   CL 104 04/13/2024   CREATININE 0.61 04/13/2024   BUN 17 04/13/2024   CO2 25 04/13/2024   TSH 1.07 02/16/2024   INR 1.1 06/27/2022   HGBA1C 6.1 02/16/2024    DG Lumbar Spine 2-3 Views Result Date: 04/22/2024 EXAM: 2 or 3 VIEW(S) XRAY OF THE LUMBAR SPINE 04/22/2024 03:37:21 PM COMPARISON: None available. CLINICAL  HISTORY: [id number] Surgery, elective [id number]. L4-S1 laminectomy. FINDINGS: 3 fluoroscopic spot views of the lumbar spine submitted from the operating room. Surgical localization at the L4-L5 and upper sacral level. Fluoroscopy time 3 seconds. Dose 2.6116 mg. IMPRESSION: 1. Procedure fluoroscopy during lumbar surgery. Electronically signed by: Andrea Gasman MD 04/22/2024 04:28 PM EDT RP Workstation: [id number]   DG C-Arm 1-60 Min-No Report Result Date: 04/22/2024 Fluoroscopy was utilized by the requesting physician.  No radiographic interpretation.   DG C-Arm 1-60 Min-No Report Result Date: 04/22/2024 Fluoroscopy was utilized by the requesting physician.  No radiographic interpretation.    DG Lumbar Spine Complete Result Date: 05/23/2024 CLINICAL DATA:  Postop low back pain. EXAM: LUMBAR SPINE - COMPLETE 4+ VIEW COMPARISON:  Intraoperative radiographs 04/22/2024. Lumbar spine radiographs 03/28/2024 and lumbar MRI 02/27/2024. FINDINGS: There are 5 lumbar type vertebral bodies. The alignment is normal. The disc spaces are preserved. Interval postsurgical changes from L4-S1 posterior decompression. No evidence of acute fracture, pars defect or discitis. The sacroiliac joints appear unremarkable. IMPRESSION: Interval postsurgical changes from L4-S1 posterior decompression. No acute osseous findings or malalignment. Electronically Signed   By: Elsie Perone M.D.   On: 05/23/2024 11:16     Assessment & Plan:  Need for immunization against influenza -     Flu vaccine  trivalent PF, 6mos and older(Flulaval,Afluria,Fluarix,Fluzone)  Acute bilateral low back pain with bilateral sciatica -     CBC with Differential/Platelet; Future -     Sedimentation rate; Future -     C-reactive protein; Future -     DG Lumbar Spine Complete; Future -     oxyCODONE -Acetaminophen ; Take 1 tablet by mouth every 8 (eight) hours as needed for pain.  Dispense: 90 tablet; Refill: 0  Vitamin B12 deficiency anemia due to intrinsic factor deficiency -     CBC with Differential/Platelet; Future  Spinal stenosis at L4-L5 level -     oxyCODONE -Acetaminophen ; Take 1 tablet by mouth every 8 (eight) hours as needed for pain.  Dispense: 90 tablet; Refill: 0  Chronic bilateral low back pain without sciatica -     oxyCODONE -Acetaminophen ; Take 1 tablet by mouth every 8 (eight) hours as needed for pain.  Dispense: 90 tablet; Refill: 0  Spondylolisthesis at L5-S1 level -     oxyCODONE -Acetaminophen ; Take 1 tablet by mouth every 8 (eight) hours as needed for pain.  Dispense: 90 tablet; Refill: 0  Malignant hypertension- She has not achieved her BP goal. Will increase the amlodipine  dose. -     amLODIPine  Besylate; Take 1 tablet (10 mg total) by mouth daily.  Dispense: 90 tablet; Refill: 0 -     AMB Referral [facility] Care Management  Long-term current use of opiate analgesic -     Naloxone HCl; Place 1 spray into the nose once for 1 dose.  Dispense: 2 each; Refill: 3     Follow-up: Return in about 3 months (around 08/23/2024).  Debby Molt, MD

## 2024-05-23 NOTE — Patient Instructions (Signed)
 Hypertension, Adult High blood pressure (hypertension) is when the force of blood pumping through the arteries is too strong. The arteries are the blood vessels that carry blood from the heart throughout the body. Hypertension forces the heart to work harder to pump blood and may cause arteries to become narrow or stiff. Untreated or uncontrolled hypertension can lead to a heart attack, heart failure, a stroke, kidney disease, and other problems. A blood pressure reading consists of a higher number over a lower number. Ideally, your blood pressure should be below 120/80. The first ("top") number is called the systolic pressure. It is a measure of the pressure in your arteries as your heart beats. The second ("bottom") number is called the diastolic pressure. It is a measure of the pressure in your arteries as the heart relaxes. What are the causes? The exact cause of this condition is not known. There are some conditions that result in high blood pressure. What increases the risk? Certain factors may make you more likely to develop high blood pressure. Some of these risk factors are under your control, including: Smoking. Not getting enough exercise or physical activity. Being overweight. Having too much fat, sugar, calories, or salt (sodium) in your diet. Drinking too much alcohol. Other risk factors include: Having a personal history of heart disease, diabetes, high cholesterol, or kidney disease. Stress. Having a family history of high blood pressure and high cholesterol. Having obstructive sleep apnea. Age. The risk increases with age. What are the signs or symptoms? High blood pressure may not cause symptoms. Very high blood pressure (hypertensive crisis) may cause: Headache. Fast or irregular heartbeats (palpitations). Shortness of breath. Nosebleed. Nausea and vomiting. Vision changes. Severe chest pain, dizziness, and seizures. How is this diagnosed? This condition is diagnosed by  measuring your blood pressure while you are seated, with your arm resting on a flat surface, your legs uncrossed, and your feet flat on the floor. The cuff of the blood pressure monitor will be placed directly against the skin of your upper arm at the level of your heart. Blood pressure should be measured at least twice using the same arm. Certain conditions can cause a difference in blood pressure between your right and left arms. If you have a high blood pressure reading during one visit or you have normal blood pressure with other risk factors, you may be asked to: Return on a different day to have your blood pressure checked again. Monitor your blood pressure at home for 1 week or longer. If you are diagnosed with hypertension, you may have other blood or imaging tests to help your health care provider understand your overall risk for other conditions. How is this treated? This condition is treated by making healthy lifestyle changes, such as eating healthy foods, exercising more, and reducing your alcohol intake. You may be referred for counseling on a healthy diet and physical activity. Your health care provider may prescribe medicine if lifestyle changes are not enough to get your blood pressure under control and if: Your systolic blood pressure is above 130. Your diastolic blood pressure is above 80. Your personal target blood pressure may vary depending on your medical conditions, your age, and other factors. Follow these instructions at home: Eating and drinking  Eat a diet that is high in fiber and potassium, and low in sodium, added sugar, and fat. An example of this eating plan is called the DASH diet. DASH stands for Dietary Approaches to Stop Hypertension. To eat this way: Eat  plenty of fresh fruits and vegetables. Try to fill one half of your plate at each meal with fruits and vegetables. Eat whole grains, such as whole-wheat pasta, brown rice, or whole-grain bread. Fill about one  fourth of your plate with whole grains. Eat or drink low-fat dairy products, such as skim milk or low-fat yogurt. Avoid fatty cuts of meat, processed or cured meats, and poultry with skin. Fill about one fourth of your plate with lean proteins, such as fish, chicken without skin, beans, eggs, or tofu. Avoid pre-made and processed foods. These tend to be higher in sodium, added sugar, and fat. Reduce your daily sodium intake. Many people with hypertension should eat less than 1,500 mg of sodium a day. Do not drink alcohol if: Your health care provider tells you not to drink. You are pregnant, may be pregnant, or are planning to become pregnant. If you drink alcohol: Limit how much you have to: 0-1 drink a day for women. 0-2 drinks a day for men. Know how much alcohol is in your drink. In the [place] one drink equals one 12 oz bottle of beer (355 mL), one 5 oz glass of wine (148 mL), or one 1 oz glass of hard liquor (44 mL). Lifestyle  Work with your health care provider to maintain a healthy body weight or to lose weight. Ask what an ideal weight is for you. Get at least 30 minutes of exercise that causes your heart to beat faster (aerobic exercise) most days of the week. Activities may include walking, swimming, or biking. Include exercise to strengthen your muscles (resistance exercise), such as Pilates or lifting weights, as part of your weekly exercise routine. Try to do these types of exercises for 30 minutes at least 3 days a week. Do not use any products that contain nicotine or tobacco. These products include cigarettes, chewing tobacco, and vaping devices, such as e-cigarettes. If you need help quitting, ask your health care provider. Monitor your blood pressure at home as told by your health care provider. Keep all follow-up visits. This is important. Medicines Take over-the-counter and prescription medicines only as told by your health care provider. Follow directions carefully. Blood  pressure medicines must be taken as prescribed. Do not skip doses of blood pressure medicine. Doing this puts you at risk for problems and can make the medicine less effective. Ask your health care provider about side effects or reactions to medicines that you should watch for. Contact a health care provider if you: Think you are having a reaction to a medicine you are taking. Have headaches that keep coming back (recurring). Feel dizzy. Have swelling in your ankles. Have trouble with your vision. Get help right away if you: Develop a severe headache or confusion. Have unusual weakness or numbness. Feel faint. Have severe pain in your chest or abdomen. Vomit repeatedly. Have trouble breathing. These symptoms may be an emergency. Get help right away. Call 911. Do not wait to see if the symptoms will go away. Do not drive yourself to the hospital. Summary Hypertension is when the force of blood pumping through your arteries is too strong. If this condition is not controlled, it may put you at risk for serious complications. Your personal target blood pressure may vary depending on your medical conditions, your age, and other factors. For most people, a normal blood pressure is less than 120/80. Hypertension is treated with lifestyle changes, medicines, or a combination of both. Lifestyle changes include losing weight, eating a healthy,  low-sodium diet, exercising more, and limiting alcohol. This information is not intended to replace advice given to you by your health care provider. Make sure you discuss any questions you have with your health care provider. Document Revised: 05/28/2021 Document Reviewed: 05/28/2021 Elsevier Patient Education  2024 [employer].

## 2024-05-24 ENCOUNTER — Ambulatory Visit: Payer: Self-pay | Admitting: Internal Medicine

## 2024-05-25 ENCOUNTER — Telehealth: Payer: Self-pay | Admitting: *Deleted

## 2024-05-25 NOTE — Progress Notes (Unsigned)
 Care Guide Pharmacy Note  05/25/2024 Name: [name] MRN: [id number] DOB: 1966/05/01  Referred By: Joshua Debby CROME, MD Reason for referral: Call Attempt #1 and Complex Care Management (Outreach to schedule referral with pharmacist )   Mary Sanford is a 58 y.o. year old female who is a primary care patient of Joshua Debby CROME, MD.  Randine JONETTA Manns was referred to the pharmacist for assistance related to: HTN  An unsuccessful telephone outreach was attempted today to contact the patient who was referred to the pharmacy team for assistance with medication management. Additional attempts will be made to contact the patient.  Thedford Franks, CMA [place]  [facility], [facility] Guide Direct Dial: [phone]  Fax: [phone] Website: [email or web address]

## 2024-05-26 ENCOUNTER — Ambulatory Visit
Admission: RE | Admit: 2024-05-26 | Discharge: 2024-05-26 | Disposition: A | Discharge status: Home / Self Care | Source: Ambulatory Visit | Attending: Physician Assistant | Admitting: Physician Assistant

## 2024-05-26 DIAGNOSIS — M48062 Spinal stenosis, lumbar region with neurogenic claudication: Secondary | ICD-10-CM

## 2024-05-26 NOTE — Progress Notes (Unsigned)
 Complex Care Management Note Care Guide Note  05/26/2024 Name: Mary Sanford MRN: [id number] DOB: 1966-02-03   Complex Care Management Outreach Attempts: A second unsuccessful outreach was attempted today to offer the patient with information about available complex care management services.  Follow Up Plan:  No further outreach attempts will be made at this time. We have been unable to contact the patient to offer or enroll patient in complex care management services.  Encounter Outcome:  No Answer  Thedford Franks, CMA [place]  [facility], [facility] Guide Direct Dial: [phone]  Fax: [phone] Website: [email or web address]

## 2024-05-27 ENCOUNTER — Telehealth: Payer: Self-pay | Admitting: Family Medicine

## 2024-05-27 NOTE — Telephone Encounter (Signed)
 I have sent a message to scheduling to get her rescheduled with sedation.

## 2024-05-27 NOTE — Telephone Encounter (Signed)
 Taryn with imaging scheduling states they cannot get her in for MRI with Sedation until April. She left a message for the patient informing her of this situation. Not sure if there is another location we could try.

## 2024-05-27 NOTE — Progress Notes (Signed)
 Care Guide Pharmacy Note  05/27/2024 Name: Mary Sanford MRN: [id number] DOB: [birth year]  Referred By: Joshua Debby CROME, MD Reason for referral: Call Attempt #1 and Complex Care Management (Outreach to schedule referral with pharmacist )   Mary Sanford is a 58 y.o. year old female who is a primary care patient of Joshua Debby CROME, MD.  Mary Sanford was referred to the pharmacist for assistance related to: HTN  Successful contact was made with the patient to discuss pharmacy services including being ready for the pharmacist to call at least 5 minutes before the scheduled appointment time and to have medication bottles and any blood pressure readings ready for review. The patient agreed to meet with the pharmacist via telephone visit on 06/06/2024  Thedford Franks, CMA [place]  [facility], [facility] Guide Direct Dial: [phone]  Fax: [phone] Website: [email or web address]

## 2024-05-29 ENCOUNTER — Other Ambulatory Visit: Payer: Self-pay

## 2024-05-29 ENCOUNTER — Encounter (HOSPITAL_COMMUNITY): Payer: Self-pay | Admitting: Emergency Medicine

## 2024-05-29 ENCOUNTER — Observation Stay (HOSPITAL_COMMUNITY)
Admission: EM | Admit: 2024-05-29 | Discharge: 2024-05-30 | Disposition: A | Discharge status: Home / Self Care | Attending: Family Medicine | Admitting: Family Medicine

## 2024-05-29 DIAGNOSIS — G9609 Other spinal cerebrospinal fluid leak: Secondary | ICD-10-CM | POA: Diagnosis not present

## 2024-05-29 DIAGNOSIS — S0990XA Unspecified injury of head, initial encounter: Secondary | ICD-10-CM | POA: Insufficient documentation

## 2024-05-29 DIAGNOSIS — M5416 Radiculopathy, lumbar region: Secondary | ICD-10-CM | POA: Diagnosis not present

## 2024-05-29 DIAGNOSIS — I10 Essential (primary) hypertension: Secondary | ICD-10-CM | POA: Diagnosis present

## 2024-05-29 DIAGNOSIS — Z87891 Personal history of nicotine dependence: Secondary | ICD-10-CM | POA: Insufficient documentation

## 2024-05-29 DIAGNOSIS — R110 Nausea: Secondary | ICD-10-CM

## 2024-05-29 DIAGNOSIS — F1090 Alcohol use, unspecified, uncomplicated: Secondary | ICD-10-CM | POA: Insufficient documentation

## 2024-05-29 DIAGNOSIS — E785 Hyperlipidemia, unspecified: Secondary | ICD-10-CM | POA: Diagnosis present

## 2024-05-29 DIAGNOSIS — Z9889 Other specified postprocedural states: Secondary | ICD-10-CM

## 2024-05-29 DIAGNOSIS — R519 Headache, unspecified: Secondary | ICD-10-CM | POA: Diagnosis not present

## 2024-05-29 DIAGNOSIS — R51 Headache with orthostatic component, not elsewhere classified: Secondary | ICD-10-CM

## 2024-05-29 DIAGNOSIS — X58XXXA Exposure to other specified factors, initial encounter: Secondary | ICD-10-CM | POA: Diagnosis not present

## 2024-05-29 DIAGNOSIS — G9600 Cerebrospinal fluid leak, unspecified: Secondary | ICD-10-CM | POA: Diagnosis not present

## 2024-05-29 DIAGNOSIS — Z79899 Other long term (current) drug therapy: Secondary | ICD-10-CM | POA: Insufficient documentation

## 2024-05-29 DIAGNOSIS — Z8659 Personal history of other mental and behavioral disorders: Secondary | ICD-10-CM | POA: Diagnosis not present

## 2024-05-29 DIAGNOSIS — G9782 Other postprocedural complications and disorders of nervous system: Secondary | ICD-10-CM | POA: Diagnosis present

## 2024-05-29 DIAGNOSIS — M545 Low back pain, unspecified: Secondary | ICD-10-CM | POA: Diagnosis not present

## 2024-05-29 DIAGNOSIS — G8929 Other chronic pain: Secondary | ICD-10-CM | POA: Diagnosis not present

## 2024-05-29 DIAGNOSIS — M5442 Lumbago with sciatica, left side: Principal | ICD-10-CM

## 2024-05-29 LAB — CBC
HCT: 42.8 % (ref 36.0–46.0)
Hemoglobin: 13.3 g/dL (ref 12.0–15.0)
MCH: 23.3 pg — ABNORMAL LOW (ref 26.0–34.0)
MCHC: 31.1 g/dL (ref 30.0–36.0)
MCV: 74.8 fL — ABNORMAL LOW (ref 80.0–100.0)
Platelets: 259 K/uL (ref 150–400)
RBC: 5.72 MIL/uL — ABNORMAL HIGH (ref 3.87–5.11)
RDW: 15.1 % (ref 11.5–15.5)
WBC: 5.6 K/uL (ref 4.0–10.5)
nRBC: 0 % (ref 0.0–0.2)

## 2024-05-29 LAB — BASIC METABOLIC PANEL WITH GFR
Anion gap: 12 (ref 5–15)
BUN: 10 mg/dL (ref 6–20)
CO2: 24 mmol/L (ref 22–32)
Calcium: 10.4 mg/dL — ABNORMAL HIGH (ref 8.9–10.3)
Chloride: 103 mmol/L (ref 98–111)
Creatinine, Ser: 0.7 mg/dL (ref 0.44–1.00)
GFR, Estimated: >60 mL/min (ref >60)
Glucose, Bld: 144 mg/dL — ABNORMAL HIGH (ref 70–99)
Potassium: 3.9 mmol/L (ref 3.5–5.1)
Sodium: 139 mmol/L (ref 135–145)

## 2024-05-29 MED ORDER — SENNOSIDES-DOCUSATE SODIUM 8.6-50 MG PO TABS: 1.0000 | ORAL_TABLET | Freq: Every evening | ORAL | Status: DC | PRN

## 2024-05-29 MED ORDER — ONDANSETRON HCL 4 MG PO TABS: 4.0000 mg | ORAL_TABLET | Freq: Four times a day (QID) | ORAL | Status: DC | PRN

## 2024-05-29 MED ORDER — OXYCODONE HCL 5 MG PO TABS
5.0000 mg | ORAL_TABLET | Freq: Three times a day (TID) | ORAL | Status: DC | PRN
  Administered 2024-05-30: 5 mg via ORAL
  Filled 2024-05-29: qty 1

## 2024-05-29 MED ORDER — DIPHENHYDRAMINE HCL 25 MG PO CAPS
25.0000 mg | ORAL_CAPSULE | Freq: Once | ORAL | Status: AC
  Administered 2024-05-29: 25 mg via ORAL
  Filled 2024-05-29: qty 1

## 2024-05-29 MED ORDER — SPIRONOLACTONE 25 MG PO TABS: 50.0000 mg | ORAL_TABLET | Freq: Every day | ORAL | Status: DC

## 2024-05-29 MED ORDER — OXYCODONE-ACETAMINOPHEN 10-325 MG PO TABS: 1.0000 | ORAL_TABLET | Freq: Three times a day (TID) | ORAL | Status: DC | PRN

## 2024-05-29 MED ORDER — TIZANIDINE HCL 4 MG PO TABS: 2.0000 mg | ORAL_TABLET | Freq: Three times a day (TID) | ORAL | Status: DC | PRN

## 2024-05-29 MED ORDER — ONDANSETRON HCL 4 MG/2ML IJ SOLN: 4.0000 mg | Freq: Four times a day (QID) | INTRAMUSCULAR | Status: DC | PRN

## 2024-05-29 MED ORDER — ACETAMINOPHEN 500 MG PO TABS: 1000.0000 mg | ORAL_TABLET | Freq: Four times a day (QID) | ORAL | Status: DC | PRN

## 2024-05-29 MED ORDER — ESCITALOPRAM OXALATE 10 MG PO TABS: 20.0000 mg | ORAL_TABLET | Freq: Every day | ORAL | Status: DC

## 2024-05-29 MED ORDER — BISACODYL 5 MG PO TBEC: 5.0000 mg | DELAYED_RELEASE_TABLET | Freq: Every day | ORAL | Status: DC | PRN

## 2024-05-29 MED ORDER — ACETAMINOPHEN 650 MG RE SUPP: 650.0000 mg | Freq: Four times a day (QID) | RECTAL | Status: DC | PRN

## 2024-05-29 MED ORDER — OXYCODONE-ACETAMINOPHEN 5-325 MG PO TABS: 1.0000 | ORAL_TABLET | Freq: Three times a day (TID) | ORAL | Status: DC | PRN

## 2024-05-29 MED ORDER — FENTANYL CITRATE (PF) 50 MCG/ML IJ SOSY
50.0000 ug | PREFILLED_SYRINGE | Freq: Once | INTRAMUSCULAR | Status: AC
  Administered 2024-05-29: 50 ug via INTRAVENOUS
  Filled 2024-05-29: qty 1

## 2024-05-29 MED ORDER — KETOROLAC TROMETHAMINE 15 MG/ML IJ SOLN
15.0000 mg | Freq: Once | INTRAMUSCULAR | Status: AC
  Administered 2024-05-29: 15 mg via INTRAVENOUS
  Filled 2024-05-29: qty 1

## 2024-05-29 MED ORDER — GABAPENTIN 300 MG PO CAPS: 300.0000 mg | ORAL_CAPSULE | Freq: Three times a day (TID) | ORAL | Status: DC

## 2024-05-29 MED ORDER — HYDROMORPHONE HCL 1 MG/ML IJ SOLN: 0.5000 mg | INTRAMUSCULAR | Status: DC | PRN

## 2024-05-29 MED ORDER — MAGNESIUM SULFATE 2 GM/50ML IV SOLN
2.0000 g | Freq: Once | INTRAVENOUS | Status: AC
  Administered 2024-05-29: 2 g via INTRAVENOUS
  Filled 2024-05-29: qty 50

## 2024-05-29 MED ORDER — PROCHLORPERAZINE MALEATE 5 MG PO TABS
10.0000 mg | ORAL_TABLET | Freq: Once | ORAL | Status: AC
  Administered 2024-05-29: 10 mg via ORAL
  Filled 2024-05-29: qty 2

## 2024-05-29 MED ORDER — ATORVASTATIN CALCIUM 10 MG PO TABS: 10.0000 mg | ORAL_TABLET | Freq: Every day | ORAL | Status: DC

## 2024-05-29 MED ORDER — NEBIVOLOL HCL 10 MG PO TABS
10.0000 mg | ORAL_TABLET | Freq: Every day | ORAL | Status: DC
  Filled 2024-05-29: qty 1

## 2024-05-29 MED ORDER — SODIUM CHLORIDE 0.9 % IV BOLUS
1000.0000 mL | Freq: Once | INTRAVENOUS | Status: AC
  Administered 2024-05-29: 1000 mL via INTRAVENOUS

## 2024-05-29 MED ORDER — AMLODIPINE BESYLATE 5 MG PO TABS: 10.0000 mg | ORAL_TABLET | Freq: Every day | ORAL | Status: DC

## 2024-05-29 NOTE — Progress Notes (Signed)
 I was contacted by Dr. Sharlet today. Mary Sanford is seen in the emergency department for a concern for possible delayed CSF leak. It does appear that she previously had some concerns about painful nodules on her incisions however she was more painful and a recumbent position than she was went upright at that point.  She continues to require significant amounts of narcotic pain medication. She came into the emergency department after not being able to tolerate an MRI.  I discussed with Dr. Sharlet that if she does get admitted for a MRI with and without contrast of her lumbar spine, and this shows a potential CSF leak or infection that they could initiate a transfer over to [facility] so I could care for her.  Otherwise, we can set up outpatient follow up for her this week to discuss any plans going forward.  Please feel to reach out to me directly through chat or phone

## 2024-05-29 NOTE — ED Notes (Signed)
 Pt has a some swelling to the lower back

## 2024-05-29 NOTE — ED Provider Triage Note (Signed)
 Emergency Medicine Provider Triage Evaluation Note  Mary Sanford , a 58 y.o. female  was evaluated in triage.  Pt complains of headache and back pain.  Patient underwent a laminectomy on 9/19.  Patient complains of throbbing headache that has been ongoing for 2 weeks, as well as back pain that has been off/on since the time of her surgery.  She describes back pain that originates around her surgical site and radiates down her left buttocks/down her leg and at times radiates across her abdomen and toward her vulva, this is occasionally associated with left lower extremity numbness but none currently.  No fever, no bowel/bladder incontinence.  She also endorses a large squishy mass around her surgical site, she was supposed to have an MRI for this this week but she cannot lay flat, she was told that she needs to undergo an MRI with sedation which cannot be done until April.  Review of Systems  Positive: As above Negative: As above  Physical Exam  BP (!) 138/99 (BP Location: Right Arm)   Pulse 75   Temp 98.1 F (36.7 C) (Oral)   Resp 16   LMP 05/15/2021 (Approximate)   SpO2 98%  Gen:   Awake, no distress   Resp:  Normal effort  MSK:   Moves extremities without difficulty  Other:  5/5 strength against resistance of bilateral lower extremities, no appreciable sensory deficit, facial expressions are symmetric and intact without evidence of facial droop, normal cerebellar testing without ataxia including finger-to-nose.  To the right of her surgical incision of her L-spine there is a soft mass that is tender to palpation, no obvious erythema/warmth.  Medical Decision Making  Medically screening exam initiated at 1:02 PM.  Appropriate orders placed.  Mary Sanford was informed that the remainder of the evaluation will be completed by another provider, this initial triage assessment does not replace that evaluation, and the importance of remaining in the ED until their evaluation is  complete.  Will obtain basic labs and migraine cocktail.   Mary Sanford, [place] 05/29/24 1306

## 2024-05-29 NOTE — ED Triage Notes (Signed)
 Pt arrives via POV. PT c/o headache for about 2 weeks and nausea over the past 3 days. She reports ongoing back pain as well. States she had a laminectomy last month. Pt is AxOx4.

## 2024-05-29 NOTE — H&P (Signed)
 History and Physical    DARLETTA NOBLETT FMW:[id number] DOB: February 12, 1966 DOA: 05/29/2024  PCP: Joshua Debby CROME, MD  Patient coming from: Home  I have personally briefly reviewed patient's old medical records in [facility]  Chief Complaint: Headache, nausea, worsening lower back pain  HPI: SELIA WAREING is a 58 y.o. female with medical history significant for HTN, HLD, chronic lumbar back pain/radiculopathy due to spinal stenosis s/p L4-S1 laminectomy and decompression on 04/22/2024 by neurosurgery Dr. Penne Sharps who presented to the ED for evaluation of headache, nausea, worsening back pain, and subcutaneous swelling around surgical site.  Patient was seen in neurosurgery office as follow-up on 05/04/2024.  She was noted to have ongoing lower back pain.  Her oxycodone  was refilled and she was given Medrol  dose pack.  Patient noted seeing round areas of swelling under her skin at her incision site.  She called her neurosurgery office on 05/18/2024, pictures uploaded in chart.  She was set up for an outpatient MRI however could not tolerate laying flat on her back due to significant pain.  MRI with sedation was recommended however cannot be scheduled until April 2026.  Patient reports developing new headache 1 week ago.  She has noticed that headache worsens when she presses the swollen areas at her incision site.  3 days ago she developed nausea without vomiting.  She reports good strength in her extremities and has been ambulating well although has some balance issues.  No fevers, chills, diaphoresis, chest pain, dyspnea.  ED Course  Labs/Imaging on admission: I have personally reviewed following labs and imaging studies.  Initial vitals showed BP 130/99, pulse 75, RR 16, temp 98.1 F, SpO2 98% on room air.  Labs showed WBC 5.6, hemoglobin 13.3, platelets 259, sodium 139, potassium 3.9, bicarb 24, BUN 10, creatinine 0.70, serum glucose 144.  Patient was given 1 L normal saline,  IV Toradol  15 mg, IV magnesium  2 g, IV Compazine, oral Benadryl , IV fentanyl  50 mcg.  EDP spoke with patient's neurosurgeon, Dr. Penne Sharps, with [facility] neurosurgery of [place].  Dr. Sharps recommended MRI with and without contrast of lumbar spine under sedation.  If CSF leak or infection noted can initiate transfer to [facility] for further care under Dr. Sharps.  MRI with sedation apparently cannot be done at [facility] or scheduled by radiology through the [facility] ED.  Per radiology patient will need to be admitted to have this scheduled and performed in an expedited manner.  Therefore the hospitalist service was consulted for admission.  Review of Systems: All systems reviewed and are negative except as documented in history of present illness above.   Past Medical History:  Diagnosis Date   Allergy    Arthritis    hands, left knee   Chronic low back pain    Depression    Dyslipidemia    Family history of adverse reaction to anesthesia    one sister ponv and slow to awaken   GAD (generalized anxiety disorder)    History of kidney stones    Hyperaldosteronism    followed by pcp   (abd MRI in epic 10-13-2023 normal)   Iron deficiency anemia    Malignant hypertension    ED visit in epic 09-22-2023 HTN urgency, bp 165/101;   previously sent to GYN office 09-15-2023 for bp 193/ 115   Panic disorder    PMB (postmenopausal bleeding)    Pre-diabetes    followed by pcp   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 tanda   Seasonal allergies    SUI (stress urinary incontinence, female)    Uterine fibroid    Wears contact lenses    Wears glasses     Past Surgical History:  Procedure Laterality Date   ABDOMINAL HYSTERECTOMY     BUNIONECTOMY Bilateral    CESAREAN SECTION  1989   COLONOSCOPY WITH PROPOFOL   11/05/2020   dr glo   CYSTOSCOPY N/A 11/03/2023   Procedure: CYSTOSCOPY;  Surgeon: Jeralyn Crutch, MD;  Location: [facility] OR;  Service: Gynecology;  Laterality: N/A;    CYSTOSCOPY W/ RETROGRADES Left 06/28/2018   Procedure: CYSTOSCOPY WITH RETROGRADE PYELOGRAM LEFT STENT;  Surgeon: Devere Lonni Righter, MD;  Location: [facility];  Service: Urology;  Laterality: Left;   cystoscopy with stents and removal of kidney stones     x 3 times   CYSTOSCOPY/URETEROSCOPY/HOLMIUM LASER/STENT PLACEMENT Left 07/16/2018   Procedure: CYSTOSCOPY/URETEROSCOPY/HOLMIUM LASER/STENT EXCHANGE;  Surgeon: Devere Lonni Righter, MD;  Location: [facility];  Service: Urology;  Laterality: Left;   DILATATION & CURETTAGE/HYSTEROSCOPY WITH MYOSURE N/A 12/13/2019   Procedure: DILATATION & CURETTAGE/HYSTEROSCOPY WITH MYOSURE;  Surgeon: Jannis Kate Norris, MD;  Location: [facility];  Service: Gynecology;  Laterality: N/A;   DILITATION & CURRETTAGE/HYSTROSCOPY WITH NOVASURE ABLATION N/A 06/17/2013   Procedure: DILATATION & CURETTAGE/HYSTEROSCOPY WITH NOVASURE ABLATION;  Surgeon: Gloris DELENA Hugger, MD;  Location: [facility];  Service: Gynecology;  Laterality: N/A;   ESOPHAGOGASTRODUODENOSCOPY (EGD) WITH PROPOFOL   10/11/2020   dr glo   FINGER SURGERY  04/2021   thumb   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   Procedure: LAPAROSCOPIC ROUX-EN-Y GASTRIC BYPASS WITH UPPER ENDOSCOPY;  Surgeon: Tanda Locus, MD;  Location: [facility];  Service: General;  Laterality: N/A;   HEMORRHOIDECTOMY WITH HEMORRHOID BANDING  10/19/2012   SCA THD Hem ligation/pexy   LAPAROSCOPIC TUBAL LIGATION Bilateral 2001   LUMBAR LAMINECTOMY/DECOMPRESSION MICRODISCECTOMY N/A 04/22/2024   Procedure: L4-S1 Lumbar Laminectomy and Decompression, Midline Approach;  Surgeon: Claudene Penne ORN, MD;  Location: [facility];  Service: Neurosurgery;  Laterality: N/A;   OPERATIVE ULTRASOUND N/A 12/13/2019   Procedure: OPERATIVE ULTRASOUND;  Surgeon: Jannis Kate Norris, MD;  Location: [facility];  Service: Gynecology;  Laterality: N/A;   ROBOTIC ASSISTED LAPAROSCOPIC HYSTERECTOMY AND  SALPINGECTOMY Bilateral 11/03/2023   Procedure: XI ROBOTIC ASSISTED LAPAROSCOPIC HYSTERECTOMY AND SALPINGO-OOPHERECTOMY;  Surgeon: Jeralyn Crutch, MD;  Location: [facility];  Service: Gynecology;  Laterality: Bilateral;   SMALL INTESTINE SURGERY  06/22/2018   gastric bypass   TUBAL LIGATION     WISDOM TOOTH EXTRACTION      Social History: Social History   Tobacco Use   Smoking status: Former    Types: Cigarettes   Smokeless tobacco: Never   Tobacco comments:    10-27-2023  pt stated quit in 2000/  started age 47 (23)  for 16 yrs  Vaping Use   Vaping status: Never Used  Substance Use Topics   Alcohol use: Yes    Alcohol/week: 1.0 - 2.0 standard drink of alcohol    Types: 1 - 2 Glasses of wine per week    Comment: per pt could 1-2 bottles per week   Drug use: Not Currently    Types: Marijuana    Comment: last used 1990s   Allergies  Allergen Reactions   Naproxen  Other (See Comments)    Panic attacks No Problem with Ibuprofen /Motrin     Family History  Problem Relation Age of Onset   Hypertension Mother  Hypothyroidism Mother    Rectal cancer Mother 67   Colon cancer Mother 23   Colon polyps Mother    Asthma Mother    Cancer Mother    Hypotension Sister    Hypothyroidism Sister    Diabetes Sister    Other Sister        pacemaker   Hypothyroidism Sister    Seizures Sister    Anemia Sister    Deep vein thrombosis Sister    Diabetes Daughter    Stroke Maternal Uncle        > 55   Hypertension Maternal Uncle    Diabetes Maternal Grandmother    Kidney failure Maternal Grandmother    Heart attack Maternal Grandfather    Other Paternal Grandmother        tumors throughout body   Seizures Paternal Grandfather    Seizures Brother    Esophageal cancer Neg Hx    Stomach cancer Neg Hx    Heart disease Neg Hx    Breast cancer Neg Hx      Prior to Admission medications   Medication Sig Start Date End Date Taking? Authorizing Provider  amLODipine  (NORVASC ) 10  MG tablet Take 1 tablet (10 mg total) by mouth daily. 05/23/24   Joshua Debby CROME, MD  atorvastatin  (LIPITOR) 10 MG tablet Take 1 tablet by mouth once daily 01/01/24   Joshua Debby CROME, MD  escitalopram  (LEXAPRO ) 20 MG tablet Take 1 tablet by mouth once daily 03/28/24   Joshua Debby CROME, MD  gabapentin  (NEURONTIN ) 300 MG capsule Take 1 capsule (300 mg total) by mouth 3 (three) times daily. 05/04/24   Ulis Bottcher, PA-C  meloxicam  (MOBIC ) 7.5 MG tablet Take 1 tablet (7.5 mg total) by mouth daily. 03/28/24   Ulis Bottcher, PA-C  Misc Natural Products (BEET ROOT PO) Take 2 tablets by mouth daily.    [provider]  nebivolol  (BYSTOLIC ) 10 MG tablet Take 1 tablet by mouth once daily 03/28/24   Joshua Debby CROME, MD  oxyCODONE -acetaminophen  (PERCOCET) 10-325 MG tablet Take 1 tablet by mouth every 8 (eight) hours as needed for pain. 05/23/24 06/22/24  Joshua Debby CROME, MD  spironolactone  (ALDACTONE ) 50 MG tablet Take 1 tablet by mouth once daily 04/12/24   Joshua Debby CROME, MD  tiZANidine  (ZANAFLEX ) 2 MG tablet Take 1-2 tablets (2-4 mg total) by mouth every 8 (eight) hours as needed for muscle spasms. 03/28/24   Ulis Bottcher, PA-C    Physical Exam: Vitals:   05/29/24 1229 05/29/24 1634 05/29/24 2011  BP: (!) 138/99 122/68 121/69  Pulse: 75 70 68  Resp: 16 18 18   Temp: 98.1 F (36.7 C) 98 F (36.7 C) 98 F (36.7 C)  TempSrc: Oral Oral Oral  SpO2: 98% 99% 99%   Constitutional: Resting in bed in the left lateral decubitus position.  NAD, calm, comfortable Eyes: EOMI, lids and conjunctivae normal ENMT: Mucous membranes are moist. Posterior pharynx clear of any exudate or lesions.Normal dentition.  Neck: normal, supple, no masses. Respiratory: clear to auscultation bilaterally, no wheezing, no crackles. Normal respiratory effort. No accessory muscle use.  Cardiovascular: Regular rate and rhythm, no murmurs / rubs / gallops. No extremity edema. 2+ pedal pulses. Abdomen: no tenderness, no masses  palpated. Musculoskeletal: S/p lumbar laminectomy.  2 rounded areas of subcutaneous swelling overlying the surgical site noted.  Palpation over this area elicits pain and headache.  No open wound, erythema, or discharge.  ROM intact all extremities. Skin: no rashes, lesions, ulcers. No  induration Neurologic: Sensation intact. Strength 5/5 in all 4 while lying in bed.  Psychiatric: Normal judgment and insight. Alert and oriented x 3. Normal mood.   EKG: Not performed.  Assessment/Plan Principal Problem:   Postoperative CSF leak Active Problems:   Status post lumbar laminectomy   Essential hypertension, benign   Dyslipidemia, goal LDL below 100   JAYAH BALTHAZAR is a 58 y.o. female with medical history significant for HTN, HLD, chronic lumbar back pain/radiculopathy due to spinal stenosis s/p L4-S1 laminectomy and decompression on 04/22/2024 by neurosurgery Dr. Penne Sharps who is admitted to undergo MRI lumbar spine under sedation due to concern for delayed postoperative CSF leak.  Assessment and Plan: S/p L4-S1 lumbar laminectomy and decompression 04/22/2024 by Dr. Penne Sharps Nausea/headache/worsening low back pain/subcutaneous swelling at surgical site>>concern for delayed CSF leak: Admission requested for MRI under sedation as this cannot be done at [facility] or scheduled through the ED.  If MRI does show CSF leak or concern for infection then will need to transfer to [facility] for further care under her neurosurgeon Dr. Sharps.  Patient has notable subcutaneous swelling at her surgical site.  Strength is 5/5 in all extremities. - MRI lumbar spine with and without contrast under general anesthesia ordered - Please notify Dr. Sharps once MRI completed to determine further plan of care  Chronic low back pain/radiculopathy: Continue home gabapentin , Zanaflex  and Percocet prn.  Hypertension: Continue amlodipine , spironolactone , Nebivolol .  Hyperlipidemia: Continue atorvastatin .   DVT  prophylaxis: SCDs Start: 05/29/24 2057 Code Status: Full code, confirmed with patient on admission Family Communication: Discussed with patient, she has discussed with family Disposition Plan: From home, dispo pending clinical progress Consults called: Neurosurgery (Dr. Penne Sharps) Severity of Illness: The appropriate patient status for this patient is INPATIENT. Inpatient status is judged to be reasonable and necessary in order to provide the required intensity of service to ensure the patient's safety. The patient's presenting symptoms, physical exam findings, and initial radiographic and laboratory data in the context of their chronic comorbidities is felt to place them at high risk for further clinical deterioration. Furthermore, it is not anticipated that the patient will be medically stable for discharge from the hospital within 2 midnights of admission.   * I certify that at the point of admission it is my clinical judgment that the patient will require inpatient hospital care spanning beyond 2 midnights from the point of admission due to high intensity of service, high risk for further deterioration and high frequency of surveillance required.DEWAINE Jorie Blanch MD [facility]  If 7PM-7AM, please contact night-coverage [email or web address]  05/29/2024, 9:06 PM

## 2024-05-29 NOTE — ED Provider Notes (Signed)
 [place] EMERGENCY DEPARTMENT AT [facility] Provider Note   CSN: [id number] Arrival date & time: 05/29/24  1223     Patient presents with: Headache, Nausea, and Back Pain   Mary Sanford is a 58 y.o. female with past medical history of hypertension, dyslipidemia, and spinal stenosis status post laminectomy 4 weeks ago presenting to the emergency department for headache, dizziness, and back pain.  Patient reports she has been having worsening back pain since her laminectomy 1 month ago.  She now has a lump and swelling on her low back surrounding her surgical site.  She reports this area is increasingly tender to pressure and she is unable to lay flat on her back.  For the past 2 weeks, she has had intermittent headache and feels that her pain shoots up from her back whenever she moves.  She also endorses dizziness with bending over.  The headache has been intermittent however worsening over the past 3 days now with associated nausea.  Patient was scheduled for outpatient MRI however she is unable to lay flat on her back due to pain.  She is been rescheduled for sedated MRI in April.  She denies fevers, chills, sudden onset headache, blurry vision, difficulty ambulating, weakness in her lower extremities.  Patient denies bowel or urinary incontinence, pain with chewing and pain of her temples.     Headache Associated symptoms: back pain   Back Pain Associated symptoms: headaches        Prior to Admission medications   Medication Sig Start Date End Date Taking? Authorizing Provider  amLODipine  (NORVASC ) 10 MG tablet Take 1 tablet (10 mg total) by mouth daily. 05/23/24  Yes Joshua Debby CROME, MD  atorvastatin  (LIPITOR) 10 MG tablet Take 1 tablet by mouth once daily 01/01/24  Yes Joshua Debby CROME, MD  escitalopram  (LEXAPRO ) 20 MG tablet Take 1 tablet by mouth once daily 03/28/24  Yes Joshua Debby CROME, MD  gabapentin  (NEURONTIN ) 300 MG capsule Take 1 capsule (300 mg total) by mouth  3 (three) times daily. 05/04/24  Yes Ulis Bottcher, PA-C  meloxicam  (MOBIC ) 7.5 MG tablet Take 1 tablet (7.5 mg total) by mouth daily. 03/28/24  Yes Ulis Bottcher, PA-C  Misc Natural Products (BEET ROOT PO) Take 2 tablets by mouth daily.   Yes [provider]  nebivolol  (BYSTOLIC ) 10 MG tablet Take 1 tablet by mouth once daily 03/28/24  Yes Joshua Debby CROME, MD  oxyCODONE -acetaminophen  (PERCOCET) 10-325 MG tablet Take 1 tablet by mouth every 8 (eight) hours as needed for pain. 05/23/24 06/22/24 Yes Joshua Debby CROME, MD  spironolactone  (ALDACTONE ) 50 MG tablet Take 1 tablet by mouth once daily 04/12/24  Yes Joshua Debby CROME, MD  tiZANidine  (ZANAFLEX ) 2 MG tablet Take 1-2 tablets (2-4 mg total) by mouth every 8 (eight) hours as needed for muscle spasms. 03/28/24  Yes Ulis Bottcher, PA-C    Allergies: Naproxen     Review of Systems  Musculoskeletal:  Positive for back pain.  Neurological:  Positive for headaches.    Updated Vital Signs BP 121/69 (BP Location: Right Arm)   Pulse 68   Temp 98 F (36.7 C) (Oral)   Resp 18   Ht 5' 9 (1.753 m)   Wt 89.7 kg   LMP 05/15/2021 (Approximate)   SpO2 99%   BMI 29.20 kg/m   Physical Exam  (all labs ordered are listed, but only abnormal results are displayed) Labs Reviewed  CBC - Abnormal; Notable for the following components:  Result Value   RBC 5.72 (*)    MCV 74.8 (*)    MCH 23.3 (*)    All other components within normal limits  BASIC METABOLIC PANEL WITH GFR - Abnormal; Notable for the following components:   Glucose, Bld 144 (*)    Calcium  10.4 (*)    All other components within normal limits  HIV ANTIBODY (ROUTINE TESTING W REFLEX)  BASIC METABOLIC PANEL WITH GFR  CBC    EKG: None  Radiology: No results found.   Procedures   Medications Ordered in the ED  acetaminophen  (TYLENOL ) tablet 1,000 mg (has no administration in time range)    Or  acetaminophen  (TYLENOL ) suppository 650 mg (has no administration in  time range)  HYDROmorphone  (DILAUDID ) injection 0.5-1 mg (has no administration in time range)  senna-docusate (Senokot-S) tablet 1 tablet (has no administration in time range)  bisacodyl  (DULCOLAX) EC tablet 5 mg (has no administration in time range)  ondansetron  (ZOFRAN ) tablet 4 mg (has no administration in time range)    Or  ondansetron  (ZOFRAN ) injection 4 mg (has no administration in time range)  amLODipine  (NORVASC ) tablet 10 mg (has no administration in time range)  atorvastatin  (LIPITOR) tablet 10 mg (has no administration in time range)  nebivolol  (BYSTOLIC ) tablet 10 mg (has no administration in time range)  spironolactone  (ALDACTONE ) tablet 50 mg (has no administration in time range)  escitalopram  (LEXAPRO ) tablet 20 mg (has no administration in time range)  gabapentin  (NEURONTIN ) capsule 300 mg (has no administration in time range)  tiZANidine  (ZANAFLEX ) tablet 2-4 mg (has no administration in time range)  oxyCODONE -acetaminophen  (PERCOCET/ROXICET) 5-325 MG per tablet 1 tablet (has no administration in time range)    And  oxyCODONE  (Oxy IR/ROXICODONE ) immediate release tablet 5 mg (has no administration in time range)  prochlorperazine (COMPAZINE) tablet 10 mg (10 mg Oral Given 05/29/24 1306)  diphenhydrAMINE  (BENADRYL ) capsule 25 mg (25 mg Oral Given 05/29/24 1306)  ketorolac  (TORADOL ) 15 MG/ML injection 15 mg (15 mg Intravenous Given 05/29/24 1619)  magnesium  sulfate IVPB 2 g 50 mL (0 g Intravenous Stopped 05/29/24 1814)  sodium chloride  0.9 % bolus 1,000 mL (0 mLs Intravenous Stopped 05/29/24 1814)  fentaNYL  (SUBLIMAZE ) injection 50 mcg (50 mcg Intravenous Given 05/29/24 1835)    Clinical Course as of 05/29/24 2352  Sun May 29, 2024  1809 Sedated L spine MR with and without - tx if  [LB]  [id number] [LB]    Clinical Course User Index [LB] Sharlet Dowdy, MD                                 Medical Decision Making Patient 58 year old female who presents with back  pain and worsening headache since undergoing laminectomy 4 weeks ago.  On evaluation, patient's headache is worsened with positional changes.  She has a large fluid collection surrounding her surgical site, incision is intact without leakage.  No surrounding erythema or warmth, patient is afebrile. Patient has associated nausea but no vomiting.  Patient without focal neurodeficits.    Differential diagnosis includes but not limited to cluster headache, migraine, postop complication, seroma, CSF leak  Patient's presentation is most concerning for postop complication with possible occult CSF leak.  Because of this, neurosurgery was consulted early in the patient's course.  She was given IV fluids and migraine cocktail for his symptoms.  Labs without leukocytosis, anemia or significant electrolyte abnormalities.  Per neurosurgery, patient requires  MRI of lumbar spine with and without contrast for evaluation of postop complications.  Because patient was unable to lay flat during her outpatient MRI due to significant lumbar pain, this will require sedation.  Dr. Claudene with neurosurgery agreed the patient should be admitted here at [facility] for MRI with sedation.  Patient was admitted to the hospitalist team for sedated MRI while inpatient.  Please see admitting providers note for the manage patient's care.  Amount and/or Complexity of Data Reviewed Independent Historian: spouse External Data Reviewed: radiology and notes. Labs: ordered. Decision-making details documented in ED Course.  Risk Prescription drug management. Decision regarding hospitalization.       Final diagnoses:  Acute midline low back pain with left-sided sciatica  Positional headache  Nausea    ED Discharge Orders     None          Sharlet Dowdy, MD 05/29/24 2352    Lenor Hollering, MD 05/30/24 1136

## 2024-05-29 NOTE — Hospital Course (Signed)
 Mary Sanford is a 58 y.o. female with medical history significant for HTN, HLD, chronic lumbar back pain/radiculopathy due to spinal stenosis s/p L4-S1 laminectomy and decompression on 04/22/2024 by neurosurgery Dr. Penne Sharps who is admitted to undergo MRI lumbar spine under sedation due to concern for delayed postoperative CSF leak.

## 2024-05-30 ENCOUNTER — Telehealth: Payer: Self-pay

## 2024-05-30 ENCOUNTER — Observation Stay (HOSPITAL_COMMUNITY): Admitting: Registered Nurse

## 2024-05-30 ENCOUNTER — Ambulatory Visit: Payer: Self-pay | Admitting: Neurosurgery

## 2024-05-30 ENCOUNTER — Telehealth: Admitting: Neurosurgery

## 2024-05-30 ENCOUNTER — Other Ambulatory Visit: Payer: Self-pay

## 2024-05-30 ENCOUNTER — Encounter (HOSPITAL_COMMUNITY)
Admission: EM | Disposition: A | Discharge status: Home / Self Care | Payer: Self-pay | Source: Home / Self Care | Attending: Emergency Medicine

## 2024-05-30 ENCOUNTER — Encounter (HOSPITAL_COMMUNITY): Payer: Self-pay

## 2024-05-30 ENCOUNTER — Inpatient Hospital Stay: Admit: 2024-05-30

## 2024-05-30 ENCOUNTER — Encounter (HOSPITAL_COMMUNITY): Payer: Self-pay | Admitting: Internal Medicine

## 2024-05-30 ENCOUNTER — Observation Stay (HOSPITAL_COMMUNITY)

## 2024-05-30 DIAGNOSIS — G9600 Cerebrospinal fluid leak, unspecified: Secondary | ICD-10-CM

## 2024-05-30 DIAGNOSIS — G9782 Other postprocedural complications and disorders of nervous system: Secondary | ICD-10-CM

## 2024-05-30 DIAGNOSIS — Z9889 Other specified postprocedural states: Secondary | ICD-10-CM

## 2024-05-30 DIAGNOSIS — F418 Other specified anxiety disorders: Secondary | ICD-10-CM | POA: Diagnosis not present

## 2024-05-30 DIAGNOSIS — Z01818 Encounter for other preprocedural examination: Secondary | ICD-10-CM

## 2024-05-30 DIAGNOSIS — I10 Essential (primary) hypertension: Secondary | ICD-10-CM | POA: Diagnosis not present

## 2024-05-30 DIAGNOSIS — G9609 Other spinal cerebrospinal fluid leak: Secondary | ICD-10-CM | POA: Diagnosis not present

## 2024-05-30 HISTORY — PX: RADIOLOGY WITH ANESTHESIA: SHX6223

## 2024-05-30 LAB — CBC
HCT: 41.3 % (ref 36.0–46.0)
Hemoglobin: 13 g/dL (ref 12.0–15.0)
MCH: 23.5 pg — ABNORMAL LOW (ref 26.0–34.0)
MCHC: 31.5 g/dL (ref 30.0–36.0)
MCV: 74.5 fL — ABNORMAL LOW (ref 80.0–100.0)
Platelets: 244 K/uL (ref 150–400)
RBC: 5.54 MIL/uL — ABNORMAL HIGH (ref 3.87–5.11)
RDW: 14.7 % (ref 11.5–15.5)
WBC: 5.8 K/uL (ref 4.0–10.5)
nRBC: 0 % (ref 0.0–0.2)

## 2024-05-30 LAB — HIV ANTIBODY (ROUTINE TESTING W REFLEX): HIV Screen 4th Generation wRfx: NONREACTIVE

## 2024-05-30 LAB — BASIC METABOLIC PANEL WITH GFR
Anion gap: 10 (ref 5–15)
BUN: 10 mg/dL (ref 6–20)
CO2: 23 mmol/L (ref 22–32)
Calcium: 9.5 mg/dL (ref 8.9–10.3)
Chloride: 107 mmol/L (ref 98–111)
Creatinine, Ser: 0.73 mg/dL (ref 0.44–1.00)
GFR, Estimated: >60 mL/min (ref >60)
Glucose, Bld: 128 mg/dL — ABNORMAL HIGH (ref 70–99)
Potassium: 4.2 mmol/L (ref 3.5–5.1)
Sodium: 140 mmol/L (ref 135–145)

## 2024-05-30 SURGERY — MRI WITH ANESTHESIA
Anesthesia: General

## 2024-05-30 MED ORDER — OXYCODONE HCL 5 MG PO TABS
5.0000 mg | ORAL_TABLET | Freq: Once | ORAL | Status: AC | PRN
  Administered 2024-05-30: 5 mg via ORAL

## 2024-05-30 MED ORDER — ACETAMINOPHEN 10 MG/ML IV SOLN
INTRAVENOUS | Status: DC | PRN
Start: 2024-05-30 — End: 2024-05-30
  Administered 2024-05-30: 1000 mg via INTRAVENOUS

## 2024-05-30 MED ORDER — FENTANYL CITRATE (PF) 100 MCG/2ML IJ SOLN
INTRAMUSCULAR | Status: AC
  Filled 2024-05-30: qty 2

## 2024-05-30 MED ORDER — OXYCODONE HCL 5 MG PO TABS
ORAL_TABLET | ORAL | Status: AC
  Filled 2024-05-30: qty 1

## 2024-05-30 MED ORDER — CHLORHEXIDINE GLUCONATE 0.12 % MT SOLN
15.0000 mL | Freq: Once | OROMUCOSAL | Status: AC
  Filled 2024-05-30: qty 15

## 2024-05-30 MED ORDER — LIDOCAINE 2% (20 MG/ML) 5 ML SYRINGE
INTRAMUSCULAR | Status: DC | PRN
Start: 2024-05-30 — End: 2024-05-30
  Administered 2024-05-30: 100 mg via INTRAVENOUS

## 2024-05-30 MED ORDER — ACETAMINOPHEN 10 MG/ML IV SOLN: 1000.0000 mg | Freq: Once | INTRAVENOUS | Status: DC | PRN

## 2024-05-30 MED ORDER — SUCCINYLCHOLINE CHLORIDE 200 MG/10ML IV SOSY
PREFILLED_SYRINGE | INTRAVENOUS | Status: DC | PRN
  Administered 2024-05-30: 100 mg via INTRAVENOUS

## 2024-05-30 MED ORDER — DROPERIDOL 2.5 MG/ML IJ SOLN: 0.6250 mg | Freq: Once | INTRAMUSCULAR | Status: DC | PRN

## 2024-05-30 MED ORDER — OXYCODONE HCL 5 MG/5ML PO SOLN: 5.0000 mg | Freq: Once | ORAL | Status: AC | PRN

## 2024-05-30 MED ORDER — PROPOFOL 10 MG/ML IV BOLUS
INTRAVENOUS | Status: DC | PRN
  Administered 2024-05-30: 150 mg via INTRAVENOUS

## 2024-05-30 MED ORDER — ACETAMINOPHEN 10 MG/ML IV SOLN
INTRAVENOUS | Status: AC
  Filled 2024-05-30: qty 100

## 2024-05-30 MED ORDER — MIDAZOLAM HCL (PF) 2 MG/2ML IJ SOLN
INTRAMUSCULAR | Status: DC | PRN
  Administered 2024-05-30: 2 mg via INTRAVENOUS

## 2024-05-30 MED ORDER — FENTANYL CITRATE (PF) 250 MCG/5ML IJ SOLN
INTRAMUSCULAR | Status: DC | PRN
  Administered 2024-05-30 (×2): 50 ug via INTRAVENOUS

## 2024-05-30 MED ORDER — GADOBUTROL 1 MMOL/ML IV SOLN
9.0000 mL | Freq: Once | INTRAVENOUS | Status: AC | PRN
Start: 2024-05-30 — End: 2024-05-30
  Administered 2024-05-30: 9 mL via INTRAVENOUS

## 2024-05-30 MED ORDER — MIDAZOLAM HCL 2 MG/2ML IJ SOLN
INTRAMUSCULAR | Status: AC
  Filled 2024-05-30: qty 2

## 2024-05-30 MED ORDER — ORAL CARE MOUTH RINSE: 15.0000 mL | Freq: Once | OROMUCOSAL | Status: AC

## 2024-05-30 MED ORDER — FENTANYL CITRATE (PF) 100 MCG/2ML IJ SOLN: 25.0000 ug | INTRAMUSCULAR | Status: DC | PRN

## 2024-05-30 MED ORDER — PHENYLEPHRINE HCL-NACL 20-0.9 MG/250ML-% IV SOLN
INTRAVENOUS | Status: DC | PRN
  Administered 2024-05-30: 50 ug/min via INTRAVENOUS

## 2024-05-30 MED ORDER — ONDANSETRON HCL 4 MG/2ML IJ SOLN
INTRAMUSCULAR | Status: DC | PRN
  Administered 2024-05-30: 4 mg via INTRAVENOUS

## 2024-05-30 MED ORDER — LACTATED RINGERS IV SOLN: INTRAVENOUS | Status: DC

## 2024-05-30 MED ORDER — CHLORHEXIDINE GLUCONATE 0.12 % MT SOLN
OROMUCOSAL | Status: AC
  Administered 2024-05-30: 15 mL via OROMUCOSAL
  Filled 2024-05-30: qty 15

## 2024-05-30 MED ORDER — DEXAMETHASONE SOD PHOSPHATE PF 10 MG/ML IJ SOLN
INTRAMUSCULAR | Status: DC | PRN
  Administered 2024-05-30: 10 mg via INTRAVENOUS

## 2024-05-30 NOTE — Discharge Instructions (Addendum)
 Mary Sanford,  You were in the hospital with headaches after surgery and are found to have some leaking cerebral spinal fluid. Your neurosurgeon plans to have this fixed but will arrange for this as an outpatient. Please continue your pain medications as previously prescribed.

## 2024-05-30 NOTE — Discharge Summary (Signed)
 Physician Discharge Summary   Patient: Mary Sanford MRN: [id number] DOB: Sep 23, 1965  Admit date:     05/29/2024  Discharge date: 05/30/24  Discharge Physician: Elgin Lam, MD   PCP: Joshua Debby CROME, MD   Recommendations at discharge:  Neurosurgery follow-up scheduled for today at 3:30 PM  Discharge Diagnoses: Principal Problem:   Postoperative CSF leak Active Problems:   Status post lumbar laminectomy   Essential hypertension, benign   Dyslipidemia, goal LDL below 100  Resolved Problems:   * No resolved hospital problems. *  Hospital Course: [name] is a 58 y.o. female with medical history significant for HTN, HLD, chronic lumbar back pain/radiculopathy due to spinal stenosis s/p L4-S1 laminectomy and decompression on 04/22/2024 by neurosurgery Dr. Penne Sharps who is admitted to undergo MRI lumbar spine under sedation due to concern for delayed postoperative CSF leak.  Assessment and Plan:  Headache Patient has a history of L4-S1 lumbar laminectomy decompression on September 19.  Patient with worsening headache with associated nausea and low back pain with swelling surgical site.  Concern for delayed CSF leak.  Patient was transferred to [facility] to obtain MRI under anesthesia.  MRI confirms evidence of CSF leak.  Patient's neurosurgeon, Dr. Sharps initially recommended transfer back to [facility] however has decided that patient can follow-up outpatient with plan for surgical management and future date.  Patient agreement with plan with headache manageable with oral analgesics.  Chronic low back pain Radiculopathy Continue home gabapentin , Zanaflex , Percocet.  Primary pretension Continue amlodipine , spironolactone , Nebivolol .  Hyperlipidemia Continue atorvastatin  daily.   Consultants: Neurosurgery Procedures performed: None Disposition: Home Diet recommendation: Cardiac diet   DISCHARGE MEDICATION: Allergies as of 05/30/2024        Reactions   Naproxen  Other (See Comments)   Panic attacks No Problem with Ibuprofen /Motrin         Medication List     TAKE these medications    amLODipine  10 MG tablet Commonly known as: NORVASC  Take 1 tablet (10 mg total) by mouth daily.   atorvastatin  10 MG tablet Commonly known as: LIPITOR Take 1 tablet by mouth once daily   BEET ROOT PO Take 2 tablets by mouth daily.   escitalopram  20 MG tablet Commonly known as: LEXAPRO  Take 1 tablet by mouth once daily   gabapentin  300 MG capsule Commonly known as: Neurontin  Take 1 capsule (300 mg total) by mouth 3 (three) times daily.   meloxicam  7.5 MG tablet Commonly known as: Mobic  Take 1 tablet (7.5 mg total) by mouth daily.   nebivolol  10 MG tablet Commonly known as: BYSTOLIC  Take 1 tablet by mouth once daily   oxyCODONE -acetaminophen  10-325 MG tablet Commonly known as: PERCOCET Take 1 tablet by mouth every 8 (eight) hours as needed for pain.   spironolactone  50 MG tablet Commonly known as: ALDACTONE  Take 1 tablet by mouth once daily   tiZANidine  2 MG tablet Commonly known as: ZANAFLEX  Take 1-2 tablets (2-4 mg total) by mouth every 8 (eight) hours as needed for muscle spasms.        Discharge Exam: BP 137/89 (BP Location: Right Arm)   Pulse 74   Temp 98.9 F (37.2 C)   Resp 15   Ht 5' 9 (1.753 m)   Wt 89.7 kg   LMP 05/15/2021 (Approximate)   SpO2 94%   BMI 29.20 kg/m   General exam: Appears calm and comfortable. Respiratory system: Clear to auscultation. Respiratory effort normal. Cardiovascular system: S1 & S2 heard, RRR.  No murmur. Gastrointestinal system: Abdomen is nondistended, soft and nontender. Normal bowel sounds heard. Central nervous system: Alert and oriented. No focal neurological deficits. Musculoskeletal: No edema. No calf tenderness Skin: No cyanosis. No rashes Psychiatry: Judgement and insight appear normal. Mood & affect appropriate.   Condition at discharge: stable  The  results of significant diagnostics from this hospitalization (including imaging, microbiology, ancillary and laboratory) are listed below for reference.   Imaging Studies: DG Lumbar Spine Complete Result Date: 05/23/2024 CLINICAL DATA:  Postop low back pain. EXAM: LUMBAR SPINE - COMPLETE 4+ VIEW COMPARISON:  Intraoperative radiographs 04/22/2024. Lumbar spine radiographs 03/28/2024 and lumbar MRI 02/27/2024. FINDINGS: There are 5 lumbar type vertebral bodies. The alignment is normal. The disc spaces are preserved. Interval postsurgical changes from L4-S1 posterior decompression. No evidence of acute fracture, pars defect or discitis. The sacroiliac joints appear unremarkable. IMPRESSION: Interval postsurgical changes from L4-S1 posterior decompression. No acute osseous findings or malalignment. Electronically Signed   By: Elsie Perone M.D.   On: 05/23/2024 11:16    Microbiology: Results for orders placed or performed during the hospital encounter of 04/13/24  Surgical pcr screen     Status: None   Collection Time: 04/13/24  9:44 AM   Specimen: Nasal Mucosa; Nasal Swab  Result Value Ref Range Status   MRSA, PCR NEGATIVE NEGATIVE Final   Staphylococcus aureus NEGATIVE NEGATIVE Final    Comment: (NOTE) The Xpert SA Assay (FDA approved for NASAL specimens in patients 18 years of age and older), is one component of a comprehensive surveillance program. It is not intended to diagnose infection nor to guide or monitor treatment. Performed at [facility], [address] [place], [place] [address]    *Note: Due to a large number of results and/or encounters for the requested time period, some results have not been displayed. A complete set of results can be found in Results Review.    Labs: CBC: Recent Labs  Lab 05/23/24 1038 05/29/24 1310 05/30/24 0601  WBC 5.8 5.6 5.8  NEUTROABS 1.7  --   --   HGB 12.4 13.3 13.0  HCT 39.4 42.8 41.3  MCV 74.3* 74.8* 74.5*  PLT 194.0 259  244   Basic Metabolic Panel: Recent Labs  Lab 05/29/24 1310 05/30/24 0601  NA 139 140  K 3.9 4.2  CL 103 107  CO2 24 23  GLUCOSE 144* 128*  BUN 10 10  CREATININE 0.70 0.73  CALCIUM  10.4* 9.5    Discharge time spent: 35 minutes.  Signed: Elgin Lam, MD [facility] 05/30/2024

## 2024-05-30 NOTE — Anesthesia Procedure Notes (Signed)
 Procedure Name: Intubation Date/Time: 05/30/2024 9:20 AM  Performed by: Virgil Ee, CRNAPre-anesthesia Checklist: Patient identified, Patient being monitored, Timeout performed, Emergency Drugs available and Suction available Patient Re-evaluated:Patient Re-evaluated prior to induction Oxygen Delivery Method: Circle system utilized Preoxygenation: Pre-oxygenation with 100% oxygen Induction Type: IV induction Ventilation: Mask ventilation without difficulty Laryngoscope Size: Mac and 4 Grade View: Grade I Tube type: Oral Tube size: 6.5 mm Number of attempts: 1 Airway Equipment and Method: Stylet Placement Confirmation: ETT inserted through vocal cords under direct vision, positive ETCO2 and breath sounds checked- equal and bilateral Secured at: 22 cm Tube secured with: Tape Dental Injury: Teeth and Oropharynx as per pre-operative assessment

## 2024-05-30 NOTE — Telephone Encounter (Signed)
MRI was completed today.

## 2024-05-30 NOTE — ED Notes (Signed)
 Report given to short stay nurse. Pt changed into hospital gown and all metal/jewelry removed. Wedding band placed in urine cup, labeled, and put in pt belonging back.

## 2024-05-30 NOTE — Progress Notes (Signed)
 I had a follow-up phone visit today with Mary Sanford.  She was at home and I was in the office.  We attempted a video visit but converted the phone due to lack of good connection.  She gave consent to go forward with a phone visit.  We discussed her findings which showed a CSF leak postoperatively.  It looks like there is likely a leak site at the more proximal end near the cranial portion of the laminectomy site.   we discussed risk benefits of surgery including the expansion of the laminectomy for CSF leak repair and possible lumbar drain placement.  Given her symptoms of headache, wound swelling, positional symptoms, and large pseudomeningocele formation we will plan to take her to the OR for laminectomy for CSF leak repair, possible lumbar drain placement.  Risk and benefits were discussed.  She would like to go forward with surgery.  Penne MICAEL Sharps, MD  Spent a total of 15 minutes on the phone discussing her care.

## 2024-05-30 NOTE — Anesthesia Preprocedure Evaluation (Addendum)
 Anesthesia Evaluation  Patient identified by MRN, date of birth, ID band Patient awake    Reviewed: Allergy & Precautions, H&P , NPO status , Patient's Chart, lab work & pertinent test results, reviewed documented beta blocker date and time   Airway Mallampati: II  TM Distance: >3 FB Neck ROM: Full    Dental  (+) Teeth Intact, Dental Advisory Given   Pulmonary former smoker   Pulmonary exam normal breath sounds clear to auscultation       Cardiovascular hypertension, Pt. on medications and Pt. on home beta blockers Normal cardiovascular exam Rhythm:Regular Rate:Normal     Neuro/Psych  PSYCHIATRIC DISORDERS Anxiety Depression    negative neurological ROS     GI/Hepatic Neg liver ROS,,,S/P gastric bypass   Endo/Other  negative endocrine ROS  Hyperaldosteronism  Renal/GU negative Renal ROS  negative genitourinary   Musculoskeletal  (+) Arthritis ,    Abdominal   Peds negative pediatric ROS (+)  Hematology negative hematology ROS (+)   Anesthesia Other Findings   Reproductive/Obstetrics negative OB ROS Postmenopausal bleeding                              Anesthesia Physical Anesthesia Plan  ASA: 3  Anesthesia Plan: General   Post-op Pain Management: Minimal or no pain anticipated   Induction: Intravenous  PONV Risk Score and Plan: 3 and Midazolam , Ondansetron  and Treatment may vary due to age or medical condition  Airway Management Planned: Oral ETT  Additional Equipment: None  Intra-op Plan:   Post-operative Plan: Extubation in OR  Informed Consent: I have reviewed the patients History and Physical, chart, labs and discussed the procedure including the risks, benefits and alternatives for the proposed anesthesia with the patient or authorized representative who has indicated his/her understanding and acceptance.     Dental advisory given  Plan Discussed with:  CRNA  Anesthesia Plan Comments: ( Patient was scheduled for outpatient MRI however she is unable to lay flat on her back due to pain.)        Anesthesia Quick Evaluation

## 2024-05-30 NOTE — Transfer of Care (Signed)
 Immediate Anesthesia Transfer of Care Note  Patient: Mary Sanford  Procedure(s) Performed: MRI WITH ANESTHESIA  Patient Location: PACU  Anesthesia Type:General  Level of Consciousness: drowsy and responds to stimulation  Airway & Oxygen Therapy: Patient Spontanous Breathing and Patient connected to nasal cannula oxygen  Post-op Assessment: Report given to RN and Post -op Vital signs reviewed and stable  Post vital signs: Reviewed and stable  Last Vitals:  Vitals Value Taken Time  BP 131/88 05/30/24 10:45  Temp 37.2 C 05/30/24 10:33  Pulse 77 05/30/24 10:48  Resp 16 05/30/24 10:48  SpO2 93 % 05/30/24 10:48  Vitals shown include unfiled device data.  Last Pain:  Vitals:   05/30/24 1042  TempSrc:   PainSc: 9          Complications: No notable events documented.

## 2024-05-30 NOTE — ED Notes (Signed)
Pt taken to short stay

## 2024-05-30 NOTE — Anesthesia Postprocedure Evaluation (Signed)
 Anesthesia Post Note  Patient: Mary Sanford  Procedure(s) Performed: MRI WITH ANESTHESIA     Patient location during evaluation: PACU Anesthesia Type: General Level of consciousness: awake and alert Pain management: pain level controlled Vital Signs Assessment: post-procedure vital signs reviewed and stable Respiratory status: spontaneous breathing, nonlabored ventilation, respiratory function stable and patient connected to nasal cannula oxygen Cardiovascular status: blood pressure returned to baseline and stable Postop Assessment: no apparent nausea or vomiting Anesthetic complications: no   No notable events documented.  Last Vitals:  Vitals:   05/30/24 1100 05/30/24 1115  BP: (!) 140/83 137/89  Pulse: 74 74  Resp: 14 15  Temp:  37.2 C  SpO2: 94% 94%    Last Pain:  Vitals:   05/30/24 1115  TempSrc:   PainSc: Asleep                 Thom JONELLE [name]

## 2024-05-30 NOTE — Telephone Encounter (Signed)
 Planned surgery: Laminectomy for postoperative CSF (cerebrospinal fluid) leak, possible lumbar drain placement   Surgery date: 06/02/24 at [facility]: [address] [place], [place] [address]) - you will find out your arrival time the business day before your surgery.   Pre-op appointment at [facility] Pre-admit Testing: you will receive a call with a date/time for this appointment. If you are scheduled for an in person appointment, Pre-admit Testing is located on the first floor of the [facility] building, [address], [address]. During this appointment, they will advise you which medications you can take the morning of surgery, and which medications you will need to hold for surgery. Labs (such as blood work, EKG) may be done at your pre-op appointment. You are not required to fast for these labs. Should you need to change your pre-op appointment, please call Pre-admit testing at [phone].     Common restrictions after spine surgery: No bending, lifting, or twisting ("BLT"). Avoid lifting objects heavier than 10 pounds for the first 6 weeks after surgery. Where possible, avoid household activities that involve lifting, bending, reaching, pushing, or pulling such as laundry, vacuuming, grocery shopping, and childcare. Try to arrange for help from friends and family for these activities while you heal. Do not drive while taking prescription pain medication. Weeks 6 through 12 after surgery: avoid lifting more than 25 pounds.       How to contact us :  If you have any questions/concerns before or after surgery, you can reach us  at [phone], or you can send a mychart message. We can be reached by phone or mychart 8am-4pm, Monday-Friday.  *Please note: Calls after 4pm are forwarded to a third party answering service. Mychart messages are not routinely monitored during evenings, weekends, and holidays. Please call our office to contact  the answering service for urgent concerns during non-business hours.    If you have FMLA/disability paperwork, please drop it off or fax it to [phone]   Appointments/FMLA & disability paperwork: Reche Hait, & Nichole Registered Nurse/Surgery scheduler: Argusta Mcgann, RN & Katie, RN Certified Medical Assistants: Don, CMA, Elenor, CMA, Damien, CMA, & [place]on, [place] Physician Assistants: Lyle Decamp, PA-C, Edsel Goods, PA-C & Glade Boys, PA-C Surgeons: Penne Sharps, MD & Reeves Daisy, MD   [facility] PREADMIT TESTING VISIT and SURGERY INFORMATION SHEET   Now that surgery has been scheduled you can anticipate several phone calls from [facility] services. A pharmacy technician will call you to verify your current list of medications taken at home.               The Pre-Service Center will call to verify your insurance information and to give you billing estimates and information.             The Preadmit Testing Office will be calling to schedule a visit to obtain information for the anesthesia team and provide instructions on preparation for surgery.  What can you expect for the Preadmit Testing Visit: Appointments may be scheduled in-person or by telephone.  If a telephone visit is scheduled, you may be asked to come into the office to have lab tests or other studies performed.   This visit will not be completed any greater than 14 days prior to your surgery.  If your surgery has been scheduled for a future date, please do not be alarmed if we have not contacted you to schedule an appointment more than a month prior to the  surgery date.    Please be prepared to provide the following information during this appointment:            -Personal medical history                                               -Medication and allergy list            -Any history of problems with anesthesia              -Recent lab work or diagnostic studies            -Please  notify us  of any needs we should be aware of to provide the best care possible           -You will be provided with instructions on how to prepare for your surgery.    On The Day of Surgery:  You must have a driver to take you home after surgery, you will be asked not to drive for 24 hours following surgery.  Taxi, Gisele and non-medical transport will not be acceptable means of transportation unless you have a responsible individual who will be traveling with you.  Visitors in the surgical area:   2 people will be able to visit you in your room once your preparation for surgery has been completed. During surgery, your visitors will be asked to wait in the Surgery Waiting Area.  It is not a requirement for them to stay, if they prefer to leave and come back.  Your visitor(s) will be given an update once the surgery has been completed.  No visitors are allowed in the initial recovery room to respect patient privacy and safety.  Once you are more awake and transfer to the secondary recovery area, or are transferred to an inpatient room, visitors will again be able to see you.  To respect and protect your privacy: We will ask on the day of surgery who your driver will be and what the contact number for that individual will be. We will ask if it is okay to share information with this individual, or if there is an alternative individual that we, or the surgeon, should contact to provide updates and information. If family or friends come to the surgical information desk requesting information about you, who you have not listed with us , no information will be given.   It may be helpful to designate someone as the main contact who will be responsible for updating your other friends and family.    PREADMIT TESTING OFFICE: [phone] SAME DAY SURGERY: [phone] We look forward to caring for you before and throughout the process of your surgery.

## 2024-05-31 ENCOUNTER — Other Ambulatory Visit: Payer: Self-pay

## 2024-05-31 ENCOUNTER — Encounter (HOSPITAL_COMMUNITY): Payer: Self-pay | Admitting: Radiology

## 2024-05-31 ENCOUNTER — Encounter
Admission: RE | Admit: 2024-05-31 | Discharge: 2024-05-31 | Disposition: A | Discharge status: Home / Self Care | Source: Ambulatory Visit | Attending: Neurosurgery | Admitting: Neurosurgery

## 2024-05-31 VITALS — Ht 69.0 in | Wt 196.0 lb

## 2024-05-31 DIAGNOSIS — Z01812 Encounter for preprocedural laboratory examination: Secondary | ICD-10-CM

## 2024-05-31 NOTE — Patient Instructions (Addendum)
 Your procedure is scheduled on: Thursday 06/02/24 Report to the Registration Desk on the 1st floor of the [facility]. To find out your arrival time, please call [phone] between 1PM - 3PM on: Wednesday 06/01/24 If your arrival time is 6:00 am, do not arrive before that time as the [facility] entrance doors do not open until 6:00 am.  REMEMBER: Instructions that are not followed completely may result in serious medical risk, up to and including death; or upon the discretion of your surgeon and anesthesiologist your surgery may need to be rescheduled.  Do not eat food after midnight the night before surgery.  No gum chewing or hard candies.  You may however, drink CLEAR liquids up to 2 hours before you are scheduled to arrive for your surgery. Do not drink anything within 2 hours of your scheduled arrival time.  Clear liquids include: - water   - apple juice without pulp - black coffee or tea (Do NOT add milk or creamers to the coffee or tea) Do NOT drink anything that is not on this list.   One week prior to surgery: Stop Anti-inflammatories (NSAIDS) such as Advil , Aleve , Ibuprofen , meloxicam  (MOBIC ), Motrin , Naproxen , Naprosyn  and Aspirin  based products such as Excedrin, Goody's Powder, BC Powder. Stop ANY OVER THE COUNTER supplements until after surgery.  You may however, continue to take Tylenol  if needed for pain up until the day of surgery.  Continue taking all of your other prescription medications up until the day of surgery.  ON THE DAY OF SURGERY ONLY TAKE THESE MEDICATIONS WITH SIPS OF WATER :  amLODipine  (NORVASC ) 10 MG  gabapentin  (NEURONTIN ) 300 MG  escitalopram  (LEXAPRO ) 20 MG  nebivolol  (BYSTOLIC ) 10 MG    No Alcohol for 24 hours before or after surgery.  No Smoking including e-cigarettes for 24 hours before surgery.  No chewable tobacco products for at least 6 hours before surgery.  No nicotine patches on the day of surgery.  Do not use any  recreational drugs for at least a week (preferably 2 weeks) before your surgery.  Please be advised that the combination of cocaine and anesthesia may have negative outcomes, up to and including death. If you test positive for cocaine, your surgery will be cancelled.  On the morning of surgery brush your teeth with toothpaste and water , you may rinse your mouth with mouthwash if you wish. Do not swallow any toothpaste or mouthwash.  Use CHG Soap or wipes as directed on instruction sheet.  Do not wear jewelry, make-up, hairpins, clips or nail polish.  For welded (permanent) jewelry: bracelets, anklets, waist bands, etc.  Please have this removed prior to surgery.  If it is not removed, there is a chance that hospital personnel will need to cut it off on the day of surgery.  Do not wear lotions, powders, or perfumes.   Do not shave body hair from the neck down 48 hours before surgery.  Contact lenses, hearing aids and dentures may not be worn into surgery.  Do not bring valuables to the hospital. [facility] is not responsible for any missing/lost belongings or valuables.   Notify your doctor if there is any change in your medical condition (cold, fever, infection).  Wear comfortable clothing (specific to your surgery type) to the hospital.  After surgery, you can help prevent lung complications by doing breathing exercises.  Take deep breaths and cough every 1-2 hours. Your doctor may order a device called an Incentive Spirometer to help you take deep  breaths. When coughing or sneezing, hold a pillow firmly against your incision with both hands. This is called "splinting." Doing this helps protect your incision. It also decreases belly discomfort.  If you are being admitted to the hospital overnight, leave your suitcase in the car. After surgery it may be brought to your room.  In case of increased patient census, it may be necessary for you, the patient, to continue your  postoperative care in the Same Day Surgery department.  If you are being discharged the day of surgery, you will not be allowed to drive home. You will need a responsible individual to drive you home and stay with you for 24 hours after surgery.   If you are taking public transportation, you will need to have a responsible individual with you.  Please call the Pre-admissions Testing Dept. at [phone] if you have any questions about these instructions.  Surgery Visitation Policy:  Patients having surgery or a procedure may have two visitors.  Children under the age of 57 must have an adult with them who is not the patient.  Inpatient Visitation:    Visiting hours are 7 a.m. to 8 p.m. Up to four visitors are allowed at one time in a patient room. The visitors may rotate out with other people during the day.  One visitor age 64 or older may stay with the patient overnight and must be in the room by 8 p.m.   Merchandiser, Retail to address health-related social needs:  [email or web address]                                                                                                            Preparing for Surgery with CHLORHEXIDINE  GLUCONATE (CHG) Soap  Chlorhexidine  Gluconate (CHG) Soap  o An antiseptic cleaner that kills germs and bonds with the skin to continue killing germs even after washing  o Used for showering the night before surgery and morning of surgery  Before surgery, you can play an important role by reducing the number of germs on your skin.  CHG (Chlorhexidine  gluconate) soap is an antiseptic cleanser which kills germs and bonds with the skin to continue killing germs even after washing.  Please do not use if you have an allergy to CHG or antibacterial soaps. If your skin becomes reddened/irritated stop using the CHG.  1. Shower the NIGHT BEFORE SURGERY with CHG soap.  2. If you choose to wash your hair, wash your hair first as usual  with your normal shampoo.  3. After shampooing, rinse your hair and body thoroughly to remove the shampoo.  4. Use CHG as you would any other liquid soap. You can apply CHG directly to the skin and wash gently with a clean washcloth.  5. Apply the CHG soap to your body only from the neck down. Do not use on open wounds or open sores. Avoid contact with your eyes, ears, mouth, and genitals (private parts). Wash face and genitals (private parts) with your normal soap.  6. Wash thoroughly, paying special attention  to the area where your surgery will be performed.  7. Thoroughly rinse your body with warm water .  8. Do not shower/wash with your normal soap after using and rinsing off the CHG soap.  9. Do not use lotions, oils, etc., after showering with CHG.  10. Pat yourself dry with a clean towel.  11. Wear clean pajamas to bed the night before surgery.  12. Place clean sheets on your bed the night of your shower and do not sleep with pets.  13. Do not apply any deodorants/lotions/powders.  14. Please wear clean clothes to the hospital.  15. Remember to brush your teeth with your regular toothpaste.

## 2024-06-01 ENCOUNTER — Encounter: Admitting: Neurosurgery

## 2024-06-02 ENCOUNTER — Other Ambulatory Visit: Payer: Self-pay

## 2024-06-02 ENCOUNTER — Ambulatory Visit

## 2024-06-02 ENCOUNTER — Ambulatory Visit
Admission: RE | Admit: 2024-06-02 | Discharge: 2024-06-04 | Disposition: A | Discharge status: Home / Self Care | Attending: Neurosurgery | Admitting: Neurosurgery

## 2024-06-02 ENCOUNTER — Encounter: Payer: Self-pay | Admitting: Neurosurgery

## 2024-06-02 ENCOUNTER — Encounter
Admission: RE | Disposition: A | Discharge status: Home / Self Care | Payer: Self-pay | Source: Home / Self Care | Attending: Neurosurgery

## 2024-06-02 DIAGNOSIS — G9600 Cerebrospinal fluid leak, unspecified: Secondary | ICD-10-CM | POA: Diagnosis present

## 2024-06-02 DIAGNOSIS — Z87891 Personal history of nicotine dependence: Secondary | ICD-10-CM | POA: Diagnosis not present

## 2024-06-02 DIAGNOSIS — G9609 Other spinal cerebrospinal fluid leak: Secondary | ICD-10-CM | POA: Insufficient documentation

## 2024-06-02 DIAGNOSIS — Y838 Other surgical procedures as the cause of abnormal reaction of the patient, or of later complication, without mention of misadventure at the time of the procedure: Secondary | ICD-10-CM | POA: Diagnosis not present

## 2024-06-02 DIAGNOSIS — F411 Generalized anxiety disorder: Secondary | ICD-10-CM | POA: Diagnosis not present

## 2024-06-02 DIAGNOSIS — I10 Essential (primary) hypertension: Secondary | ICD-10-CM | POA: Insufficient documentation

## 2024-06-02 DIAGNOSIS — G9782 Other postprocedural complications and disorders of nervous system: Secondary | ICD-10-CM | POA: Diagnosis present

## 2024-06-02 DIAGNOSIS — M48061 Spinal stenosis, lumbar region without neurogenic claudication: Secondary | ICD-10-CM | POA: Diagnosis not present

## 2024-06-02 DIAGNOSIS — Z01818 Encounter for other preprocedural examination: Secondary | ICD-10-CM

## 2024-06-02 DIAGNOSIS — Z01812 Encounter for preprocedural laboratory examination: Secondary | ICD-10-CM

## 2024-06-02 DIAGNOSIS — G8929 Other chronic pain: Secondary | ICD-10-CM

## 2024-06-02 DIAGNOSIS — D509 Iron deficiency anemia, unspecified: Secondary | ICD-10-CM | POA: Diagnosis not present

## 2024-06-02 DIAGNOSIS — Z9889 Other specified postprocedural states: Secondary | ICD-10-CM

## 2024-06-02 HISTORY — PX: LAMINECTOMY FOR CEREBROSPINAL FLUID LEAK: SHX5886

## 2024-06-02 LAB — TYPE AND SCREEN
ABO/RH(D): B POS
Antibody Screen: NEGATIVE

## 2024-06-02 SURGERY — LAMINECTOMY FOR CEREBROSPINAL FLUID LEAK
Anesthesia: General | Site: Spine Lumbar

## 2024-06-02 MED ORDER — PHENOL 1.4 % MT LIQD: 1.0000 | OROMUCOSAL | Status: DC | PRN

## 2024-06-02 MED ORDER — FENTANYL CITRATE (PF) 100 MCG/2ML IJ SOLN
INTRAMUSCULAR | Status: AC
  Filled 2024-06-02: qty 2

## 2024-06-02 MED ORDER — MAGNESIUM HYDROXIDE 400 MG/5ML PO SUSP: 30.0000 mL | Freq: Every day | ORAL | Status: DC | PRN

## 2024-06-02 MED ORDER — LACTATED RINGERS IV SOLN: INTRAVENOUS | Status: DC

## 2024-06-02 MED ORDER — BUPIVACAINE-EPINEPHRINE (PF) 0.5% -1:200000 IJ SOLN
INTRAMUSCULAR | Status: DC | PRN
  Administered 2024-06-02: 9 mL

## 2024-06-02 MED ORDER — GABAPENTIN 300 MG PO CAPS
300.0000 mg | ORAL_CAPSULE | Freq: Three times a day (TID) | ORAL | Status: DC
  Administered 2024-06-02 – 2024-06-04 (×6): 300 mg via ORAL
  Filled 2024-06-02 (×6): qty 1

## 2024-06-02 MED ORDER — BUTALBITAL-APAP-CAFFEINE 50-325-40 MG PO TABS
1.0000 | ORAL_TABLET | ORAL | Status: DC | PRN
  Administered 2024-06-02: 1 via ORAL
  Filled 2024-06-02 (×2): qty 1

## 2024-06-02 MED ORDER — PROPOFOL 10 MG/ML IV BOLUS
INTRAVENOUS | Status: AC
  Filled 2024-06-02: qty 40

## 2024-06-02 MED ORDER — BUPIVACAINE HCL (PF) 0.5 % IJ SOLN
INTRAMUSCULAR | Status: AC
  Filled 2024-06-02: qty 30

## 2024-06-02 MED ORDER — DEXMEDETOMIDINE HCL IN NACL 80 MCG/20ML IV SOLN
INTRAVENOUS | Status: DC | PRN
Start: 2024-06-02 — End: 2024-06-02
  Administered 2024-06-02: 12 ug via INTRAVENOUS

## 2024-06-02 MED ORDER — ACETAMINOPHEN 650 MG RE SUPP: 650.0000 mg | RECTAL | Status: DC | PRN

## 2024-06-02 MED ORDER — PANTOPRAZOLE SODIUM 40 MG IV SOLR
40.0000 mg | Freq: Every day | INTRAVENOUS | Status: DC
  Administered 2024-06-02: 40 mg via INTRAVENOUS
  Filled 2024-06-02: qty 10

## 2024-06-02 MED ORDER — EPHEDRINE SULFATE-NACL 50-0.9 MG/10ML-% IV SOSY
PREFILLED_SYRINGE | INTRAVENOUS | Status: DC | PRN
  Administered 2024-06-02: 10 mg via INTRAVENOUS
  Administered 2024-06-02: 5 mg via INTRAVENOUS

## 2024-06-02 MED ORDER — BUPIVACAINE LIPOSOME 1.3 % IJ SUSP
INTRAMUSCULAR | Status: AC
  Filled 2024-06-02: qty 10

## 2024-06-02 MED ORDER — ORAL CARE MOUTH RINSE: 15.0000 mL | Freq: Once | OROMUCOSAL | Status: DC

## 2024-06-02 MED ORDER — ONDANSETRON HCL 4 MG/2ML IJ SOLN: 4.0000 mg | Freq: Four times a day (QID) | INTRAMUSCULAR | Status: DC | PRN

## 2024-06-02 MED ORDER — BISACODYL 5 MG PO TBEC
5.0000 mg | DELAYED_RELEASE_TABLET | Freq: Every day | ORAL | Status: DC | PRN
  Administered 2024-06-03: 5 mg via ORAL
  Filled 2024-06-02: qty 1

## 2024-06-02 MED ORDER — HYDROMORPHONE HCL 1 MG/ML IJ SOLN
INTRAMUSCULAR | Status: AC
  Filled 2024-06-02: qty 1

## 2024-06-02 MED ORDER — ENOXAPARIN SODIUM 40 MG/0.4ML IJ SOSY
40.0000 mg | PREFILLED_SYRINGE | INTRAMUSCULAR | Status: DC
  Administered 2024-06-03 – 2024-06-04 (×2): 40 mg via SUBCUTANEOUS
  Filled 2024-06-02 (×2): qty 0.4

## 2024-06-02 MED ORDER — MAGNESIUM CITRATE PO SOLN: 1.0000 | Freq: Once | ORAL | Status: DC | PRN

## 2024-06-02 MED ORDER — ONDANSETRON HCL 4 MG PO TABS: 4.0000 mg | ORAL_TABLET | Freq: Four times a day (QID) | ORAL | Status: DC | PRN

## 2024-06-02 MED ORDER — ACETAMINOPHEN 325 MG PO TABS: 650.0000 mg | ORAL_TABLET | ORAL | Status: DC | PRN

## 2024-06-02 MED ORDER — ROCURONIUM BROMIDE 100 MG/10ML IV SOLN
INTRAVENOUS | Status: DC | PRN
  Administered 2024-06-02: 10 mg via INTRAVENOUS
  Administered 2024-06-02: 50 mg via INTRAVENOUS
  Administered 2024-06-02: 10 mg via INTRAVENOUS

## 2024-06-02 MED ORDER — SODIUM CHLORIDE 0.9% FLUSH
3.0000 mL | Freq: Two times a day (BID) | INTRAVENOUS | Status: DC
  Administered 2024-06-02 – 2024-06-03 (×4): 3 mL via INTRAVENOUS

## 2024-06-02 MED ORDER — ATORVASTATIN CALCIUM 10 MG PO TABS
10.0000 mg | ORAL_TABLET | Freq: Every day | ORAL | Status: DC
  Administered 2024-06-03 – 2024-06-04 (×2): 10 mg via ORAL
  Filled 2024-06-02 (×2): qty 1

## 2024-06-02 MED ORDER — OXYCODONE HCL 5 MG PO TABS
5.0000 mg | ORAL_TABLET | ORAL | Status: DC | PRN
  Filled 2024-06-02: qty 1

## 2024-06-02 MED ORDER — ONDANSETRON HCL 4 MG/2ML IJ SOLN
INTRAMUSCULAR | Status: DC | PRN
Start: 2024-06-02 — End: 2024-06-02
  Administered 2024-06-02: 4 mg via INTRAVENOUS

## 2024-06-02 MED ORDER — MENTHOL 3 MG MT LOZG: 1.0000 | LOZENGE | OROMUCOSAL | Status: DC | PRN

## 2024-06-02 MED ORDER — SODIUM CHLORIDE 0.9% FLUSH: 3.0000 mL | INTRAVENOUS | Status: DC | PRN

## 2024-06-02 MED ORDER — KETAMINE HCL 50 MG/5ML IJ SOSY
PREFILLED_SYRINGE | INTRAMUSCULAR | Status: AC
  Filled 2024-06-02: qty 5

## 2024-06-02 MED ORDER — LIDOCAINE HCL (CARDIAC) PF 100 MG/5ML IV SOSY
PREFILLED_SYRINGE | INTRAVENOUS | Status: DC | PRN
  Administered 2024-06-02: 80 mg via INTRAVENOUS

## 2024-06-02 MED ORDER — FIBRIN SEALANT 2 ML SINGLE DOSE KIT
PACK | CUTANEOUS | Status: DC | PRN
  Administered 2024-06-02: 2 mL via TOPICAL

## 2024-06-02 MED ORDER — KETAMINE HCL 50 MG/5ML IJ SOSY
PREFILLED_SYRINGE | INTRAMUSCULAR | Status: DC | PRN
Start: 2024-06-02 — End: 2024-06-02
  Administered 2024-06-02: 20 mg via INTRAVENOUS

## 2024-06-02 MED ORDER — ONDANSETRON HCL 4 MG/2ML IJ SOLN
INTRAMUSCULAR | Status: AC
  Filled 2024-06-02: qty 2

## 2024-06-02 MED ORDER — DROPERIDOL 2.5 MG/ML IJ SOLN: 0.6250 mg | Freq: Once | INTRAMUSCULAR | Status: DC | PRN

## 2024-06-02 MED ORDER — HYDROMORPHONE HCL 1 MG/ML IJ SOLN
1.0000 mg | INTRAMUSCULAR | Status: DC | PRN
  Administered 2024-06-02 – 2024-06-04 (×8): 1 mg via INTRAVENOUS
  Filled 2024-06-02 (×8): qty 1

## 2024-06-02 MED ORDER — FIBRIN SEALANT 2 ML SINGLE DOSE KIT
PACK | CUTANEOUS | Status: AC
  Filled 2024-06-02: qty 2

## 2024-06-02 MED ORDER — HYDROMORPHONE HCL 1 MG/ML IJ SOLN
INTRAMUSCULAR | Status: DC | PRN
  Administered 2024-06-02 (×2): .5 mg via INTRAVENOUS

## 2024-06-02 MED ORDER — ESCITALOPRAM OXALATE 10 MG PO TABS
20.0000 mg | ORAL_TABLET | Freq: Every day | ORAL | Status: DC
  Administered 2024-06-03 – 2024-06-04 (×2): 20 mg via ORAL
  Filled 2024-06-02 (×2): qty 2

## 2024-06-02 MED ORDER — FENTANYL CITRATE (PF) 100 MCG/2ML IJ SOLN
25.0000 ug | INTRAMUSCULAR | Status: DC | PRN
  Administered 2024-06-02 (×2): 25 ug via INTRAVENOUS
  Administered 2024-06-02: 50 ug via INTRAVENOUS

## 2024-06-02 MED ORDER — DEXAMETHASONE SOD PHOSPHATE PF 10 MG/ML IJ SOLN
INTRAMUSCULAR | Status: DC | PRN
  Administered 2024-06-02: 5 mg via INTRAVENOUS

## 2024-06-02 MED ORDER — MIDAZOLAM HCL (PF) 2 MG/2ML IJ SOLN
INTRAMUSCULAR | Status: DC | PRN
  Administered 2024-06-02: 2 mg via INTRAVENOUS

## 2024-06-02 MED ORDER — SENNA 8.6 MG PO TABS
1.0000 | ORAL_TABLET | Freq: Two times a day (BID) | ORAL | Status: DC
  Administered 2024-06-02 – 2024-06-03 (×2): 8.6 mg via ORAL
  Filled 2024-06-02 (×4): qty 1

## 2024-06-02 MED ORDER — KETOROLAC TROMETHAMINE 30 MG/ML IJ SOLN
INTRAMUSCULAR | Status: AC
  Filled 2024-06-02: qty 1

## 2024-06-02 MED ORDER — OXYCODONE HCL 5 MG PO TABS
10.0000 mg | ORAL_TABLET | ORAL | Status: DC | PRN
  Administered 2024-06-02 – 2024-06-04 (×9): 10 mg via ORAL
  Filled 2024-06-02 (×9): qty 2

## 2024-06-02 MED ORDER — CEFAZOLIN SODIUM-DEXTROSE 2-4 GM/100ML-% IV SOLN
2.0000 g | Freq: Once | INTRAVENOUS | Status: AC
  Administered 2024-06-02: 2 g via INTRAVENOUS

## 2024-06-02 MED ORDER — SODIUM CHLORIDE (PF) 0.9 % IJ SOLN
INTRAMUSCULAR | Status: AC
Start: 2024-06-02 — End: 2024-06-02
  Filled 2024-06-02: qty 10

## 2024-06-02 MED ORDER — MIDAZOLAM HCL 2 MG/2ML IJ SOLN
INTRAMUSCULAR | Status: AC
  Filled 2024-06-02: qty 2

## 2024-06-02 MED ORDER — CHLORHEXIDINE GLUCONATE 0.12 % MT SOLN: 15.0000 mL | Freq: Once | OROMUCOSAL | Status: DC

## 2024-06-02 MED ORDER — KETOROLAC TROMETHAMINE 30 MG/ML IJ SOLN
30.0000 mg | Freq: Once | INTRAMUSCULAR | Status: AC
  Administered 2024-06-02: 30 mg via INTRAVENOUS

## 2024-06-02 MED ORDER — AMLODIPINE BESYLATE 10 MG PO TABS
10.0000 mg | ORAL_TABLET | Freq: Every day | ORAL | Status: DC
  Administered 2024-06-03 – 2024-06-04 (×2): 10 mg via ORAL
  Filled 2024-06-02 (×2): qty 1

## 2024-06-02 MED ORDER — SODIUM CHLORIDE (PF) 0.9 % IJ SOLN
INTRAMUSCULAR | Status: DC | PRN
  Administered 2024-06-02: 30 mL

## 2024-06-02 MED ORDER — TIZANIDINE HCL 4 MG PO TABS
2.0000 mg | ORAL_TABLET | Freq: Three times a day (TID) | ORAL | Status: DC | PRN
  Administered 2024-06-02: 2 mg via ORAL
  Filled 2024-06-02: qty 1

## 2024-06-02 MED ORDER — ACETAMINOPHEN 500 MG PO TABS
1000.0000 mg | ORAL_TABLET | Freq: Four times a day (QID) | ORAL | Status: AC
  Administered 2024-06-02 – 2024-06-03 (×3): 1000 mg via ORAL
  Filled 2024-06-02 (×4): qty 2

## 2024-06-02 MED ORDER — NEBIVOLOL HCL 10 MG PO TABS
10.0000 mg | ORAL_TABLET | Freq: Every day | ORAL | Status: DC
  Administered 2024-06-03 – 2024-06-04 (×2): 10 mg via ORAL
  Filled 2024-06-02 (×2): qty 1

## 2024-06-02 MED ORDER — 0.9 % SODIUM CHLORIDE (POUR BTL) OPTIME
TOPICAL | Status: DC | PRN
  Administered 2024-06-02: 500 mL

## 2024-06-02 MED ORDER — SPIRONOLACTONE 25 MG PO TABS
50.0000 mg | ORAL_TABLET | Freq: Every day | ORAL | Status: DC
  Administered 2024-06-03 – 2024-06-04 (×2): 50 mg via ORAL
  Filled 2024-06-02 (×2): qty 2

## 2024-06-02 MED ORDER — ROCURONIUM BROMIDE 10 MG/ML (PF) SYRINGE
PREFILLED_SYRINGE | INTRAVENOUS | Status: AC
  Filled 2024-06-02: qty 10

## 2024-06-02 MED ORDER — SODIUM CHLORIDE 0.9 % IV SOLN
250.0000 mL | INTRAVENOUS | Status: AC
  Administered 2024-06-02: 250 mL via INTRAVENOUS

## 2024-06-02 MED ORDER — DOCUSATE SODIUM 100 MG PO CAPS
100.0000 mg | ORAL_CAPSULE | Freq: Two times a day (BID) | ORAL | Status: DC
  Administered 2024-06-02 – 2024-06-03 (×2): 100 mg via ORAL
  Filled 2024-06-02 (×4): qty 1

## 2024-06-02 MED ORDER — PROPOFOL 10 MG/ML IV BOLUS
INTRAVENOUS | Status: DC | PRN
  Administered 2024-06-02: 200 mg via INTRAVENOUS

## 2024-06-02 MED ORDER — SUGAMMADEX SODIUM 200 MG/2ML IV SOLN
INTRAVENOUS | Status: DC | PRN
  Administered 2024-06-02: 400 mg via INTRAVENOUS

## 2024-06-02 MED ORDER — FENTANYL CITRATE (PF) 100 MCG/2ML IJ SOLN
INTRAMUSCULAR | Status: DC | PRN
  Administered 2024-06-02: 100 ug via INTRAVENOUS

## 2024-06-02 MED ORDER — LIDOCAINE HCL (PF) 2 % IJ SOLN
INTRAMUSCULAR | Status: AC
  Filled 2024-06-02: qty 5

## 2024-06-02 SURGICAL SUPPLY — 33 items
BASIN KIT SINGLE STR (MISCELLANEOUS) ×1 IMPLANT
BRUSH SCRUB EZ 4% CHG (MISCELLANEOUS) ×1 IMPLANT
BUR NEURO DRILL SOFT 3.0X3.8M (BURR) ×1 IMPLANT
DERMABOND ADVANCED .7 DNX12 (GAUZE/BANDAGES/DRESSINGS) ×1 IMPLANT
DRAPE C-ARM XRAY 36X54 (DRAPES) ×2 IMPLANT
DRAPE LAPAROTOMY 100X77 ABD (DRAPES) ×1 IMPLANT
DRAPE SPINE LEICA/WILD 54X150 (DRAPES) ×1 IMPLANT
DRSG OPSITE POSTOP 3X4 (GAUZE/BANDAGES/DRESSINGS) ×1 IMPLANT
DRSG OPSITE POSTOP 4X10 (GAUZE/BANDAGES/DRESSINGS) IMPLANT
DRSG TEGADERM 4X4.75 (GAUZE/BANDAGES/DRESSINGS) IMPLANT
ELECTRODE EZSTD 165MM 6.5IN (MISCELLANEOUS) ×1 IMPLANT
ELECTRODE REM PT RTRN 9FT ADLT (ELECTROSURGICAL) ×1 IMPLANT
GLOVE BIOGEL PI IND STRL 7.0 (GLOVE) ×1 IMPLANT
GLOVE BIOGEL PI IND STRL 8 (GLOVE) ×2 IMPLANT
GLOVE SURG SYN 7.0 PF PI (GLOVE) ×1 IMPLANT
GLOVE SURG SYN 7.5 PF PI (GLOVE) ×1 IMPLANT
GOWN SRG XL LVL 3 NONREINFORCE (GOWNS) ×1 IMPLANT
GOWN STRL REUS W/ TWL LRG LVL3 (GOWN DISPOSABLE) ×1 IMPLANT
KIT WILSON FRAME (KITS) ×1 IMPLANT
NDL SAFETY ECLIP 18X1.5 (MISCELLANEOUS) ×1 IMPLANT
NS IRRIG 500ML POUR BTL (IV SOLUTION) ×1 IMPLANT
PACK LAMINECTOMY ARMC (PACKS) ×1 IMPLANT
PAD ARMBOARD POSITIONER FOAM (MISCELLANEOUS) ×2 IMPLANT
SURGIFLO W/THROMBIN 8M KIT (HEMOSTASIS) ×1 IMPLANT
SUT GORETEX 6.0 TT9 (SUTURE) IMPLANT
SUT MNCRL AB 4-0 PS2 18 (SUTURE) IMPLANT
SUT STRATA 3-0 15 PS-2 (SUTURE) ×1 IMPLANT
SUT VIC AB 0 CT1 27XCR 8 STRN (SUTURE) ×1 IMPLANT
SUT VIC AB 2-0 CT1 18 (SUTURE) ×1 IMPLANT
SUTURE EHLN 3-0 FS-10 30 BLK (SUTURE) IMPLANT
SYR 30ML LL (SYRINGE) ×2 IMPLANT
SYR 3ML LL SCALE MARK (SYRINGE) ×1 IMPLANT
TRAP FLUID SMOKE EVACUATOR (MISCELLANEOUS) ×1 IMPLANT

## 2024-06-02 NOTE — TOC CM/SW Note (Signed)
 Transition of Care [facility]) CM/SW Note    Transition of Care [facility]) - Inpatient Brief Assessment   Patient Details  Name: Mary Sanford MRN: [id number] Date of Birth: March 04, 1966  [facility]) CM/SW Contact:    Alvaro Louder, LCSW Phone Number: 06/02/2024, 3:46 PM   Clinical Narrative:  TOC Consulted, Per chart review there are no TOC needs at this time. Please consult if needs arise.   TOC to follow for DC  Transition of Care Asessment: Insurance and Status: Insurance coverage has been reviewed Patient has primary care physician: Yes Home environment has been reviewed: Single family home Prior level of function:: Ox4 Prior/Current Home Services: No current home services Social Drivers of Health Review: SDOH reviewed no interventions necessary Readmission risk has been reviewed: Yes Transition of care needs: no transition of care needs at this time

## 2024-06-02 NOTE — Telephone Encounter (Signed)
 Noted

## 2024-06-02 NOTE — H&P (View-Only) (Signed)
 Primary Physician:  Joshua Debby CROME, MD  Chief Complaint: CSF leak  History of Present Illness: 06/02/2024 Mary Sanford is a 58 y.o. female who presents with the chief complaint of CSF leak status post lumbar laminectomy.  She had a previous lumbar laminectomy a little over a month ago and had developed symptoms consistent with possible CSF leak.  She was admitted to the emergency department where she had a sedated MRI showing a CSF leak/pseudomeningocele.  Given the ongoing pressure headaches and symptoms consistent with fluid shift from CSF leak she was planned for a lumbar laminectomy for CSF leak repair. The symptoms are causing a significant impact on the patient's life.   Review of Systems:  A 10 point review of systems is negative, except for the pertinent positives and negatives detailed in the HPI.  Past Medical History: Past Medical History:  Diagnosis Date   Allergy    Arthritis    hands, left knee   Chronic low back pain    Depression    Dyslipidemia    Family history of adverse reaction to anesthesia    one sister ponv and slow to awaken   GAD (generalized anxiety disorder)    History of kidney stones    Hyperaldosteronism    followed by pcp   (abd MRI in epic 10-13-2023 normal)   Iron deficiency anemia    Malignant hypertension    ED visit in epic 09-22-2023 HTN urgency, bp 165/101;   previously sent to GYN office 09-15-2023 for bp 193/ 115   Panic disorder    PMB (postmenopausal bleeding)    Pre-diabetes    followed by pcp   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 tanda   Seasonal allergies    SUI (stress urinary incontinence, female)    Uterine fibroid    Wears contact lenses    Wears glasses     Past Surgical History: Past Surgical History:  Procedure Laterality Date   ABDOMINAL HYSTERECTOMY     BUNIONECTOMY Bilateral    CESAREAN SECTION  1989   COLONOSCOPY WITH PROPOFOL   11/05/2020   dr glo   CYSTOSCOPY N/A  11/03/2023   Procedure: CYSTOSCOPY;  Surgeon: Jeralyn Crutch, MD;  Location: [facility];  Service: Gynecology;  Laterality: N/A;   CYSTOSCOPY W/ RETROGRADES Left 06/28/2018   Procedure: CYSTOSCOPY WITH RETROGRADE PYELOGRAM LEFT STENT;  Surgeon: Devere Lonni Righter, MD;  Location: [facility];  Service: Urology;  Laterality: Left;   cystoscopy with stents and removal of kidney stones     x 3 times   CYSTOSCOPY/URETEROSCOPY/HOLMIUM LASER/STENT PLACEMENT Left 07/16/2018   Procedure: CYSTOSCOPY/URETEROSCOPY/HOLMIUM LASER/STENT EXCHANGE;  Surgeon: Devere Lonni Righter, MD;  Location: [facility];  Service: Urology;  Laterality: Left;   DILATATION & CURETTAGE/HYSTEROSCOPY WITH MYOSURE N/A 12/13/2019   Procedure: DILATATION & CURETTAGE/HYSTEROSCOPY WITH MYOSURE;  Surgeon: Jannis Kate Norris, MD;  Location: [facility];  Service: Gynecology;  Laterality: N/A;   DILITATION & CURRETTAGE/HYSTROSCOPY WITH NOVASURE ABLATION N/A 06/17/2013   Procedure: DILATATION & CURETTAGE/HYSTEROSCOPY WITH NOVASURE ABLATION;  Surgeon: Gloris DELENA Hugger, MD;  Location: [facility];  Service: Gynecology;  Laterality: N/A;   ESOPHAGOGASTRODUODENOSCOPY (EGD) WITH PROPOFOL   10/11/2020   dr glo   FINGER SURGERY  04/2021   thumb   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   Procedure: LAPAROSCOPIC ROUX-EN-Y GASTRIC BYPASS WITH UPPER ENDOSCOPY;  Surgeon: Tanda Locus, MD;  Location: [facility];  Service: General;  Laterality: N/A;   HEMORRHOIDECTOMY WITH HEMORRHOID BANDING  10/19/2012   SCA THD Hem ligation/pexy   LAPAROSCOPIC TUBAL LIGATION Bilateral 2001   LUMBAR LAMINECTOMY/DECOMPRESSION MICRODISCECTOMY N/A 04/22/2024   Procedure: L4-S1 Lumbar Laminectomy and Decompression, Midline Approach;  Surgeon: Claudene Penne ORN, MD;  Location: [facility];  Service: Neurosurgery;  Laterality: N/A;   OPERATIVE ULTRASOUND N/A 12/13/2019   Procedure: OPERATIVE ULTRASOUND;  Surgeon: Jannis Kate Norris, MD;   Location[facility];  Service: Gynecology;  Laterality: N/A;   RADIOLOGY WITH ANESTHESIA N/A 05/30/2024   Procedure: MRI WITH ANESTHESIA;  Surgeon: Radiologist, Medication, MD;  Location: [facility];  Service: Radiology;  Laterality: N/A;   ROBOTIC ASSISTED LAPAROSCOPIC HYSTERECTOMY AND SALPINGECTOMY Bilateral 11/03/2023   Procedure: XI ROBOTIC ASSISTED LAPAROSCOPIC HYSTERECTOMY AND SALPINGO-OOPHERECTOMY;  Surgeon: Jeralyn Crutch, MD;  Location: [facility];  Service: Gynecology;  Laterality: Bilateral;   SMALL INTESTINE SURGERY  06/22/2018   gastric bypass   TUBAL LIGATION     WISDOM TOOTH EXTRACTION      Allergies: Allergies as of 05/30/2024 - Review Complete 05/30/2024  Allergen Reaction Noted   Naproxen  Other (See Comments) 12/01/2012    Medications:  Current Facility-Administered Medications:    bupivacaine -epinephrine  (PF) (MARCAINE  W/ EPI) 0.5% -1:200000 injection, , , PRN, Claudene Penne ORN, MD, 5 mL at 06/02/24 9262   ceFAZolin  (ANCEF ) IVPB 2g/100 mL premix, 2 g, Intravenous, Once, Claudene Penne ORN, MD   chlorhexidine  (PERIDEX ) 0.12 % solution 15 mL, 15 mL, Mouth/Throat, Once **OR** Oral care mouth rinse, 15 mL, Mouth Rinse, Once, Mazzoni, Andrea, MD   lactated ringers  infusion, , Intravenous, Continuous, Mazzoni, Andrea, MD   Social History: Social History   Tobacco Use   Smoking status: Former    Types: Cigarettes   Smokeless tobacco: Never   Tobacco comments:    10-27-2023  pt stated quit in 2000/  started age 11 (43)  for 16 yrs  Vaping Use   Vaping status: Never Used  Substance Use Topics   Alcohol use: Yes    Alcohol/week: 1.0 - 2.0 standard drink of alcohol    Types: 1 - 2 Glasses of wine per week    Comment: per pt could 1-2 bottles per week   Drug use: Not Currently    Types: Marijuana    Comment: last used 1990s    Family Medical History: Family History  Problem Relation Age of Onset   Hypertension Mother    Hypothyroidism Mother     Rectal cancer Mother 52   Colon cancer Mother 40   Colon polyps Mother    Asthma Mother    Cancer Mother    Hypotension Sister    Hypothyroidism Sister    Diabetes Sister    Other Sister        pacemaker   Hypothyroidism Sister    Seizures Sister    Anemia Sister    Deep vein thrombosis Sister    Diabetes Daughter    Stroke Maternal Uncle        > 55   Hypertension Maternal Uncle    Diabetes Maternal Grandmother    Kidney failure Maternal Grandmother    Heart attack Maternal Grandfather    Other Paternal Grandmother        tumors throughout body   Seizures Paternal Grandfather    Seizures Brother    Esophageal cancer Neg Hx    Stomach cancer Neg Hx    Heart disease Neg Hx    Breast cancer Neg Hx     Physical Examination: Vitals:   06/02/24 9275  BP: (!) 139/92  Pulse: 71  Resp: 16  Temp: 97.8 F (36.6 C)  SpO2: 100%     General: Patient is well developed, well nourished, calm, collected, and in no apparent distress.  NEUROLOGICAL:  Boggy fluid collection under her previous incision.  No evidence of active breakdown  Strength:  Side Iliopsoas Quads Hamstring PF DF EHL  R 5 5 5 5 5 5   L 5 5 5 5 5 5     Imaging: Narrative & Impression  EXAM: MR Lumbar Spine with and without intravenous contrast. 05/30/2024 10:39:46 AM   TECHNIQUE: Multiplanar multisequence MRI of the lumbar spine was performed with and without the administration of 9 mL gadobutrol (GADAVIST) 1 MMOL/ML intravenous contrast.   COMPARISON: Lumbar radiographs 05/23/2024. Previous lumbar MRI 02/27/2024.   CLINICAL HISTORY: 58 year old female. Low back pain, prior surgery, new symptoms, subcutaneous swelling, and CSF leak.   FINDINGS:   BONES AND ALIGNMENT: Normal vertebral body heights. Normal bone marrow signal. No abnormal enhancement. Improved lumbar lordosis. No significant scoliosis or spondylolisthesis. Normal lumbar segmentation on comparison radiographs, the same numbering  system used on the previous MRI.   SPINAL CORD: Normal conus medullaris at L1. No signal abnormality in the visible lower thoracic spinal cord or conus. No abnormal intradural enhancement. No suspicious dural thickening or enhancement.   SOFT TISSUES: Subcutaneous edema above and below L4-S1.   Occasional lower thoracic and upper lumbar spine disc bulging without stenosis.     L3-L4: Disc bulging with progressed broad based central disc protrusion since 02/27/2024 (series 6 image 26). New mild spinal stenosis.   L4-L5 AND L5-S1: Posterior decompression with confluent CSF isointense fluid collection in the epidural space and tracking superficially to the skin surface. Mildly lobulated but fairly unilocular appearing collection with mild rim enhancement. The most confluent component of the collection encompasses 73 x 51 x 75 mm (AP x transverse x CC). Estimated volume 146 mL. At L4-L5, the dorsal fluid exerts some mass effect on the thecal sac (series 6 image 33) with mild spinal stenosis when combined with circumferential disc bulge.   Epidural space fluid at L5-S1 without significant spinal stenosis. See L4-L5 for findings related to the combined L4-L5 and L5-S1 surgical site and fluid collection.   Occasional lower thoracic and upper lumbar spine disc bulging without stenosis.   IMPRESSION: 1. Postoperative dorsal fluid collection (estimated volume 147 mL) from the L4 and L5 laminectomy bed tracking toward the skin surface. Favor CSF Leak. Subsequent mild mass effect on the thecal sac contributing to stenosis at L4-L5. 2. Progressed small central disc protrusion at L3-L4 since July contributing to new mild spinal stenosis there.   Electronically signed by: Helayne Hurst MD 05/30/2024 10:58 AM EDT RP Workstation: [id number]        I have personally reviewed the images and agree with the above interpretation.  Labs:    Latest Ref Rng & Units 05/30/2024    6:01 AM  05/29/2024    1:10 PM 05/23/2024   10:38 AM  CBC  WBC 4.0 - 10.5 K/uL 5.8  5.6  5.8   Hemoglobin 12.0 - 15.0 g/dL 86.9  86.6  87.5   Hematocrit 36.0 - 46.0 % 41.3  42.8  39.4   Platelets 150 - 400 K/uL 244  259  194.0       Latest Ref Rng & Units 05/30/2024    6:01 AM 05/29/2024    1:10 PM 04/13/2024    9:44 AM  BMP  Glucose 70 -  99 mg/dL 871  855  92   BUN 6 - 20 mg/dL 10  10  17    Creatinine 0.44 - 1.00 mg/dL 9.26  9.29  9.38   Sodium 135 - 145 mmol/L 140  139  142   Potassium 3.5 - 5.1 mmol/L 4.2  3.9  4.0   Chloride 98 - 111 mmol/L 107  103  104   CO2 22 - 32 mmol/L 23  24  25    Calcium  8.9 - 10.3 mg/dL 9.5  89.5  9.0         Assessment and Plan: Ms. Urias is a pleasant 58 y.o. female with history of a lumbar laminectomy approximately a month ago.  She presented with a delayed CSF leak.  She was admitted to the emergency department/hospital at [facility] given her continued symptoms and need for sedation for an MRI.  She had this done and was found to have a CSF leak.  She was discharged home and planned for surgery today.  Will plan for lumbar laminectomy exploration and closure of her dural defect.  If there are continued CSF leak here we will plan on placing a lumbar drain but will make this called based off of the closure.  Risk and benefits were discussed.  She like to go forward with the procedure.  Penne MICAEL Sharps, MD/MSCR Dept. of Neurosurgery

## 2024-06-02 NOTE — Brief Op Note (Signed)
 06/02/2024  10:37 AM  PATIENT:  Mary Sanford  58 y.o. female  PRE-OPERATIVE DIAGNOSIS:  Postoperative CSF Leak  POST-OPERATIVE DIAGNOSIS:  Postoperative CSF Leak  PROCEDURE:  Procedure(s): Laminectomy for postoperative CSF leak (N/A)  SURGEON:  Surgeons and Role:    DEWAINE Claudene Penne LELON, MD - Primary  PHYSICIAN ASSISTANT: None  ASSISTANTS: none   ANESTHESIA:   general  EBL:  5 mL   BLOOD ADMINISTERED:none  DRAINS: none   LOCAL MEDICATIONS USED:  MARCAINE      SPECIMEN:  No Specimen  DISPOSITION OF SPECIMEN:  N/A  COUNTS:  YES  TOURNIQUET:  * No tourniquets in log *  DICTATION: .Note written in EPIC  PLAN OF CARE:  Admit for overnight observation Flat bedrest, 24 hours, can liberate at lunch tomorrow No routine imaging needed Pain control per protocol Bowel regimen Incentive spirometer  PATIENT DISPOSITION:  PACU - hemodynamically stable.   Delay start of Pharmacological VTE agent (>24hrs) due to surgical blood loss or risk of bleeding: no

## 2024-06-02 NOTE — Plan of Care (Signed)

## 2024-06-02 NOTE — Anesthesia Preprocedure Evaluation (Signed)
 Anesthesia Evaluation  Patient identified by MRN, date of birth, ID band Patient awake    Reviewed: Allergy & Precautions, H&P , NPO status , Patient's Chart, lab work & pertinent test results, reviewed documented beta blocker date and time   History of Anesthesia Complications Negative for: history of anesthetic complications  Airway Mallampati: I  TM Distance: >3 FB Neck ROM: full    Dental  (+) Dental Advidsory Given, Missing, Teeth Intact   Pulmonary neg pulmonary ROS, former smoker   Pulmonary exam normal breath sounds clear to auscultation       Cardiovascular Exercise Tolerance: Good hypertension, (-) angina (-) Past MI and (-) Cardiac Stents Normal cardiovascular exam(-) dysrhythmias (-) Valvular Problems/Murmurs Rhythm:regular Rate:Normal     Neuro/Psych  PSYCHIATRIC DISORDERS Anxiety Depression    negative neurological ROS     GI/Hepatic negative GI ROS, Neg liver ROS,,,  Endo/Other  diabetes (borderline)    Renal/GU Renal disease (kidney stones)  negative genitourinary   Musculoskeletal   Abdominal   Peds  Hematology negative hematology ROS (+)   Anesthesia Other Findings Past Medical History: No date: Allergy No date: Arthritis     Comment:  hands, left knee No date: Chronic low back pain No date: Depression No date: Dyslipidemia No date: Family history of adverse reaction to anesthesia     Comment:  one sister ponv and slow to awaken No date: GAD (generalized anxiety disorder) No date: History of kidney stones No date: Hyperaldosteronism (HCC)     Comment:  followed by pcp   (abd MRI in epic 10-13-2023 normal) No date: Iron deficiency anemia No date: Malignant hypertension     Comment:  ED visit in epic 09-22-2023 HTN urgency, bp 165/101;                 previously sent to GYN office 09-15-2023 for bp 193/ 115 No date: Panic disorder No date: PMB (postmenopausal bleeding) No date:  Pre-diabetes     Comment:  followed by pcp 06/22/2018: S/P gastric bypass     Comment:  bariatric surgeon ---- dr [email or web address] No date: Seasonal allergies No date: SUI (stress urinary incontinence, female) No date: Uterine fibroid No date: Wears contact lenses No date: Wears glasses   Reproductive/Obstetrics negative OB ROS                              Anesthesia Physical Anesthesia Plan  ASA: 2  Anesthesia Plan: General   Post-op Pain Management:    Induction: Intravenous  PONV Risk Score and Plan: 3 and Ondansetron , Dexamethasone , Treatment may vary due to age or medical condition and Midazolam   Airway Management Planned: Oral ETT  Additional Equipment:   Intra-op Plan:   Post-operative Plan: Extubation in OR  Informed Consent: I have reviewed the patients History and Physical, chart, labs and discussed the procedure including the risks, benefits and alternatives for the proposed anesthesia with the patient or authorized representative who has indicated his/her understanding and acceptance.     Dental Advisory Given  Plan Discussed with: Anesthesiologist, CRNA and Surgeon  Anesthesia Plan Comments:         Anesthesia Quick Evaluation

## 2024-06-02 NOTE — Transfer of Care (Signed)
 Immediate Anesthesia Transfer of Care Note  Patient: Mary Sanford  Procedure(s) Performed: Laminectomy for postoperative CSF leak (Spine Lumbar)  Patient Location: PACU  Anesthesia Type:General  Level of Consciousness: sedated  Airway & Oxygen Therapy: Patient Spontanous Breathing and Patient connected to face mask oxygen  Post-op Assessment: Report given to RN  Post vital signs: Reviewed and stable  Last Vitals:  Vitals Value Taken Time  BP 128/74 06/02/24 10:34  Temp    Pulse 73 06/02/24 10:38  Resp 24 06/02/24 10:38  SpO2 100 % 06/02/24 10:38  Vitals shown include unfiled device data.  Last Pain:  Vitals:   06/02/24 0724  PainSc: 9          Complications: No notable events documented.

## 2024-06-02 NOTE — Op Note (Signed)
 Indications: 58 year old woman with a history of a previous lumbar laminectomy who had a delayed presentation for a spinal fluid leak.  She became significantly symptomatic was admitted to the hospital at [facility] for a sedated MRI came back with evidence of CSF leak she was taken to the OR for repair.  Findings: Dural defect noted in the lateral recess on the left at approximately the 4 5 interspace  Preoperative Diagnosis: Postoperative CSF Leak Postoperative Diagnosis: Postoperative CSF Leak   EBL: 5 mL IVF: see anesthesia record Drains: none Disposition: Extubated and Stable to PACU Complications: none  No foley catheter was placed.  Procedure: Lumbar laminectomy for CSF leak repair.  Preoperative Note:   Risks of surgery discussed include: infection, bleeding, stroke, coma, death, paralysis, CSF leak, nerve/spinal cord injury, numbness, tingling, weakness, complex regional pain syndrome, recurrent stenosis and/or disc herniation, vascular injury, development of instability, neck/back pain, need for further surgery, persistent symptoms, development of deformity, and the risks of anesthesia. They understood these risks and have agreed to proceed.  Operative Note:    The patient was then brought from the preoperative center with intravenous access established.  The patient underwent general anesthesia and endotracheal tube intubation, then was rotated on the [facility] table where all pressure points were appropriately padded.  An incision was marked with flouroscopy. The skin was then thoroughly cleansed.  Perioperative antibiotic prophylaxis was administered.  Sterile prep and drapes were then applied and a timeout was then observed.    Local anesthetic was infiltrated.  Midline linear incision was made over the previous incision.  CSF was encountered after the subcutaneous layer.  This was irrigated copiously.  We are able to continue to dissect down to the area of the previous  laminectomy.  After exploring there was a clear area of dural defect.  We explored the rest of the dura and did not see any ongoing leakage other than this 1 level at approximately the 4 5 interspace.  We utilized Kerrison rongeurs to create a better exposure of the dural defect by resecting a portion of the lamina and medial facet.  We then brought in the microscope and under microscopic guidance we continued to dissect around the level of the dural defect.  We were able to dissected the nerve roots off of the edge of the dural defect in order to be able to make a strong closure.  Using a running suture we closed the dural defect in a linear fashion with Gore-Tex suture.  Once we are able to secure this in place we tested its integrity with a prolonged Valsalva maneuver.  There is no ongoing leakage after that point.  We then placed a muscle graft over the dural defect and utilized Vistaseal.  We then scored the entirety of the wound with Bovie cautery to promote wound healing.  We closed the muscle in multiple layers, we then closed the fascia in a single watertight layer.  We then closed the rest of the layers as planned with nylon suture on the most superficial aspect.    Patient was then rotated back to the preoperative bed awakened from anesthesia and taken to recovery all counts are correct in this case.  I performed this procedure without an assistant surgeon   Penne MICAEL Sharps, MD  * No implants in log *

## 2024-06-02 NOTE — H&P (Signed)
 Primary Physician:  Joshua Debby CROME, MD  Chief Complaint: CSF leak  History of Present Illness: 06/02/2024 Mary Sanford is a 58 y.o. female who presents with the chief complaint of CSF leak status post lumbar laminectomy.  She had a previous lumbar laminectomy a little over a month ago and had developed symptoms consistent with possible CSF leak.  She was admitted to the emergency department where she had a sedated MRI showing a CSF leak/pseudomeningocele.  Given the ongoing pressure headaches and symptoms consistent with fluid shift from CSF leak she was planned for a lumbar laminectomy for CSF leak repair. The symptoms are causing a significant impact on the patient's life.   Review of Systems:  A 10 point review of systems is negative, except for the pertinent positives and negatives detailed in the HPI.  Past Medical History: Past Medical History:  Diagnosis Date   Allergy    Arthritis    hands, left knee   Chronic low back pain    Depression    Dyslipidemia    Family history of adverse reaction to anesthesia    one sister ponv and slow to awaken   GAD (generalized anxiety disorder)    History of kidney stones    Hyperaldosteronism    followed by pcp   (abd MRI in epic 10-13-2023 normal)   Iron deficiency anemia    Malignant hypertension    ED visit in epic 09-22-2023 HTN urgency, bp 165/101;   previously sent to GYN office 09-15-2023 for bp 193/ 115   Panic disorder    PMB (postmenopausal bleeding)    Pre-diabetes    followed by pcp   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 tanda   Seasonal allergies    SUI (stress urinary incontinence, female)    Uterine fibroid    Wears contact lenses    Wears glasses     Past Surgical History: Past Surgical History:  Procedure Laterality Date   ABDOMINAL HYSTERECTOMY     BUNIONECTOMY Bilateral    CESAREAN SECTION  1989   COLONOSCOPY WITH PROPOFOL   11/05/2020   dr glo   CYSTOSCOPY N/A  11/03/2023   Procedure: CYSTOSCOPY;  Surgeon: Jeralyn Crutch, MD;  Location: [facility];  Service: Gynecology;  Laterality: N/A;   CYSTOSCOPY W/ RETROGRADES Left 06/28/2018   Procedure: CYSTOSCOPY WITH RETROGRADE PYELOGRAM LEFT STENT;  Surgeon: Devere Lonni Righter, MD;  Location: [facility];  Service: Urology;  Laterality: Left;   cystoscopy with stents and removal of kidney stones     x 3 times   CYSTOSCOPY/URETEROSCOPY/HOLMIUM LASER/STENT PLACEMENT Left 07/16/2018   Procedure: CYSTOSCOPY/URETEROSCOPY/HOLMIUM LASER/STENT EXCHANGE;  Surgeon: Devere Lonni Righter, MD;  Location: [facility];  Service: Urology;  Laterality: Left;   DILATATION & CURETTAGE/HYSTEROSCOPY WITH MYOSURE N/A 12/13/2019   Procedure: DILATATION & CURETTAGE/HYSTEROSCOPY WITH MYOSURE;  Surgeon: Jannis Kate Norris, MD;  Location: [facility];  Service: Gynecology;  Laterality: N/A;   DILITATION & CURRETTAGE/HYSTROSCOPY WITH NOVASURE ABLATION N/A 06/17/2013   Procedure: DILATATION & CURETTAGE/HYSTEROSCOPY WITH NOVASURE ABLATION;  Surgeon: Gloris DELENA Hugger, MD;  Location: [facility];  Service: Gynecology;  Laterality: N/A;   ESOPHAGOGASTRODUODENOSCOPY (EGD) WITH PROPOFOL   10/11/2020   dr glo   FINGER SURGERY  04/2021   thumb   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   Procedure: LAPAROSCOPIC ROUX-EN-Y GASTRIC BYPASS WITH UPPER ENDOSCOPY;  Surgeon: Tanda Locus, MD;  Location: [facility];  Service: General;  Laterality: N/A;   HEMORRHOIDECTOMY WITH HEMORRHOID BANDING  10/19/2012   SCA THD Hem ligation/pexy   LAPAROSCOPIC TUBAL LIGATION Bilateral 2001   LUMBAR LAMINECTOMY/DECOMPRESSION MICRODISCECTOMY N/A 04/22/2024   Procedure: L4-S1 Lumbar Laminectomy and Decompression, Midline Approach;  Surgeon: Claudene Penne ORN, MD;  Location: [facility];  Service: Neurosurgery;  Laterality: N/A;   OPERATIVE ULTRASOUND N/A 12/13/2019   Procedure: OPERATIVE ULTRASOUND;  Surgeon: Jannis Kate Norris, MD;   Location[facility];  Service: Gynecology;  Laterality: N/A;   RADIOLOGY WITH ANESTHESIA N/A 05/30/2024   Procedure: MRI WITH ANESTHESIA;  Surgeon: Radiologist, Medication, MD;  Location: [facility];  Service: Radiology;  Laterality: N/A;   ROBOTIC ASSISTED LAPAROSCOPIC HYSTERECTOMY AND SALPINGECTOMY Bilateral 11/03/2023   Procedure: XI ROBOTIC ASSISTED LAPAROSCOPIC HYSTERECTOMY AND SALPINGO-OOPHERECTOMY;  Surgeon: Jeralyn Crutch, MD;  Location: [facility];  Service: Gynecology;  Laterality: Bilateral;   SMALL INTESTINE SURGERY  06/22/2018   gastric bypass   TUBAL LIGATION     WISDOM TOOTH EXTRACTION      Allergies: Allergies as of 05/30/2024 - Review Complete 05/30/2024  Allergen Reaction Noted   Naproxen  Other (See Comments) 12/01/2012    Medications:  Current Facility-Administered Medications:    bupivacaine -epinephrine  (PF) (MARCAINE  W/ EPI) 0.5% -1:200000 injection, , , PRN, Claudene Penne ORN, MD, 5 mL at 06/02/24 9262   ceFAZolin  (ANCEF ) IVPB 2g/100 mL premix, 2 g, Intravenous, Once, Claudene Penne ORN, MD   chlorhexidine  (PERIDEX ) 0.12 % solution 15 mL, 15 mL, Mouth/Throat, Once **OR** Oral care mouth rinse, 15 mL, Mouth Rinse, Once, Mazzoni, Andrea, MD   lactated ringers  infusion, , Intravenous, Continuous, Mazzoni, Andrea, MD   Social History: Social History   Tobacco Use   Smoking status: Former    Types: Cigarettes   Smokeless tobacco: Never   Tobacco comments:    10-27-2023  pt stated quit in 2000/  started age 11 (43)  for 16 yrs  Vaping Use   Vaping status: Never Used  Substance Use Topics   Alcohol use: Yes    Alcohol/week: 1.0 - 2.0 standard drink of alcohol    Types: 1 - 2 Glasses of wine per week    Comment: per pt could 1-2 bottles per week   Drug use: Not Currently    Types: Marijuana    Comment: last used 1990s    Family Medical History: Family History  Problem Relation Age of Onset   Hypertension Mother    Hypothyroidism Mother     Rectal cancer Mother 52   Colon cancer Mother 40   Colon polyps Mother    Asthma Mother    Cancer Mother    Hypotension Sister    Hypothyroidism Sister    Diabetes Sister    Other Sister        pacemaker   Hypothyroidism Sister    Seizures Sister    Anemia Sister    Deep vein thrombosis Sister    Diabetes Daughter    Stroke Maternal Uncle        > 55   Hypertension Maternal Uncle    Diabetes Maternal Grandmother    Kidney failure Maternal Grandmother    Heart attack Maternal Grandfather    Other Paternal Grandmother        tumors throughout body   Seizures Paternal Grandfather    Seizures Brother    Esophageal cancer Neg Hx    Stomach cancer Neg Hx    Heart disease Neg Hx    Breast cancer Neg Hx     Physical Examination: Vitals:   06/02/24 9275  BP: (!) 139/92  Pulse: 71  Resp: 16  Temp: 97.8 F (36.6 C)  SpO2: 100%     General: Patient is well developed, well nourished, calm, collected, and in no apparent distress.  NEUROLOGICAL:  Boggy fluid collection under her previous incision.  No evidence of active breakdown  Strength:  Side Iliopsoas Quads Hamstring PF DF EHL  R 5 5 5 5 5 5   L 5 5 5 5 5 5     Imaging: Narrative & Impression  EXAM: MR Lumbar Spine with and without intravenous contrast. 05/30/2024 10:39:46 AM   TECHNIQUE: Multiplanar multisequence MRI of the lumbar spine was performed with and without the administration of 9 mL gadobutrol (GADAVIST) 1 MMOL/ML intravenous contrast.   COMPARISON: Lumbar radiographs 05/23/2024. Previous lumbar MRI 02/27/2024.   CLINICAL HISTORY: 58 year old female. Low back pain, prior surgery, new symptoms, subcutaneous swelling, and CSF leak.   FINDINGS:   BONES AND ALIGNMENT: Normal vertebral body heights. Normal bone marrow signal. No abnormal enhancement. Improved lumbar lordosis. No significant scoliosis or spondylolisthesis. Normal lumbar segmentation on comparison radiographs, the same numbering  system used on the previous MRI.   SPINAL CORD: Normal conus medullaris at L1. No signal abnormality in the visible lower thoracic spinal cord or conus. No abnormal intradural enhancement. No suspicious dural thickening or enhancement.   SOFT TISSUES: Subcutaneous edema above and below L4-S1.   Occasional lower thoracic and upper lumbar spine disc bulging without stenosis.     L3-L4: Disc bulging with progressed broad based central disc protrusion since 02/27/2024 (series 6 image 26). New mild spinal stenosis.   L4-L5 AND L5-S1: Posterior decompression with confluent CSF isointense fluid collection in the epidural space and tracking superficially to the skin surface. Mildly lobulated but fairly unilocular appearing collection with mild rim enhancement. The most confluent component of the collection encompasses 73 x 51 x 75 mm (AP x transverse x CC). Estimated volume 146 mL. At L4-L5, the dorsal fluid exerts some mass effect on the thecal sac (series 6 image 33) with mild spinal stenosis when combined with circumferential disc bulge.   Epidural space fluid at L5-S1 without significant spinal stenosis. See L4-L5 for findings related to the combined L4-L5 and L5-S1 surgical site and fluid collection.   Occasional lower thoracic and upper lumbar spine disc bulging without stenosis.   IMPRESSION: 1. Postoperative dorsal fluid collection (estimated volume 147 mL) from the L4 and L5 laminectomy bed tracking toward the skin surface. Favor CSF Leak. Subsequent mild mass effect on the thecal sac contributing to stenosis at L4-L5. 2. Progressed small central disc protrusion at L3-L4 since July contributing to new mild spinal stenosis there.   Electronically signed by: Helayne Hurst MD 05/30/2024 10:58 AM EDT RP Workstation: [id number]        I have personally reviewed the images and agree with the above interpretation.  Labs:    Latest Ref Rng & Units 05/30/2024    6:01 AM  05/29/2024    1:10 PM 05/23/2024   10:38 AM  CBC  WBC 4.0 - 10.5 K/uL 5.8  5.6  5.8   Hemoglobin 12.0 - 15.0 g/dL 86.9  86.6  87.5   Hematocrit 36.0 - 46.0 % 41.3  42.8  39.4   Platelets 150 - 400 K/uL 244  259  194.0       Latest Ref Rng & Units 05/30/2024    6:01 AM 05/29/2024    1:10 PM 04/13/2024    9:44 AM  BMP  Glucose 70 -  99 mg/dL 871  855  92   BUN 6 - 20 mg/dL 10  10  17    Creatinine 0.44 - 1.00 mg/dL 9.26  9.29  9.38   Sodium 135 - 145 mmol/L 140  139  142   Potassium 3.5 - 5.1 mmol/L 4.2  3.9  4.0   Chloride 98 - 111 mmol/L 107  103  104   CO2 22 - 32 mmol/L 23  24  25    Calcium  8.9 - 10.3 mg/dL 9.5  89.5  9.0         Assessment and Plan: Ms. Urias is a pleasant 58 y.o. female with history of a lumbar laminectomy approximately a month ago.  She presented with a delayed CSF leak.  She was admitted to the emergency department/hospital at [facility] given her continued symptoms and need for sedation for an MRI.  She had this done and was found to have a CSF leak.  She was discharged home and planned for surgery today.  Will plan for lumbar laminectomy exploration and closure of her dural defect.  If there are continued CSF leak here we will plan on placing a lumbar drain but will make this called based off of the closure.  Risk and benefits were discussed.  She like to go forward with the procedure.  Penne MICAEL Sharps, MD/MSCR Dept. of Neurosurgery

## 2024-06-02 NOTE — Anesthesia Procedure Notes (Signed)
 Procedure Name: Intubation Date/Time: 06/02/2024 8:37 AM  Performed by: Veronica Alm BROCKS, CRNAPre-anesthesia Checklist: Patient identified, Patient being monitored, Timeout performed, Emergency Drugs available and Suction available Patient Re-evaluated:Patient Re-evaluated prior to induction Oxygen Delivery Method: Circle system utilized Preoxygenation: Pre-oxygenation with 100% oxygen Induction Type: IV induction Ventilation: Mask ventilation without difficulty Laryngoscope Size: McGrath and 4 Grade View: Grade I Tube type: Oral Tube size: 7.5 mm Number of attempts: 1 Airway Equipment and Method: Stylet Placement Confirmation: ETT inserted through vocal cords under direct vision, positive ETCO2 and breath sounds checked- equal and bilateral Secured at: 22 cm Tube secured with: Tape Dental Injury: Teeth and Oropharynx as per pre-operative assessment

## 2024-06-03 ENCOUNTER — Encounter: Payer: Self-pay | Admitting: Neurosurgery

## 2024-06-03 ENCOUNTER — Other Ambulatory Visit: Payer: Self-pay

## 2024-06-03 DIAGNOSIS — I10 Essential (primary) hypertension: Secondary | ICD-10-CM | POA: Diagnosis not present

## 2024-06-03 DIAGNOSIS — Y838 Other surgical procedures as the cause of abnormal reaction of the patient, or of later complication, without mention of misadventure at the time of the procedure: Secondary | ICD-10-CM | POA: Diagnosis not present

## 2024-06-03 DIAGNOSIS — Z87891 Personal history of nicotine dependence: Secondary | ICD-10-CM | POA: Diagnosis not present

## 2024-06-03 DIAGNOSIS — G9609 Other spinal cerebrospinal fluid leak: Secondary | ICD-10-CM | POA: Diagnosis not present

## 2024-06-03 DIAGNOSIS — G9600 Cerebrospinal fluid leak, unspecified: Secondary | ICD-10-CM | POA: Diagnosis not present

## 2024-06-03 MED ORDER — ACETAMINOPHEN 500 MG PO TABS
1000.0000 mg | ORAL_TABLET | Freq: Four times a day (QID) | ORAL | Status: DC
  Administered 2024-06-03 – 2024-06-04 (×3): 1000 mg via ORAL
  Filled 2024-06-03 (×4): qty 2

## 2024-06-03 MED ORDER — TIZANIDINE HCL 2 MG PO TABS
2.0000 mg | ORAL_TABLET | Freq: Three times a day (TID) | ORAL | 2 refills | Status: DC | PRN
  Filled 2024-06-03: qty 90, 15d supply, fill #0

## 2024-06-03 MED ORDER — POLYETHYLENE GLYCOL 3350 17 GM/SCOOP PO POWD
17.0000 g | Freq: Every day | ORAL | 0 refills | Status: DC | PRN
  Filled 2024-06-03: qty 238, 14d supply, fill #0

## 2024-06-03 MED ORDER — ACETAMINOPHEN 500 MG PO TABS: 1000.0000 mg | ORAL_TABLET | Freq: Four times a day (QID) | ORAL | Status: DC

## 2024-06-03 MED ORDER — OXYCODONE HCL 5 MG PO TABS
5.0000 mg | ORAL_TABLET | ORAL | 0 refills | Status: AC | PRN
  Filled 2024-06-03: qty 80, 20d supply, fill #0

## 2024-06-03 MED ORDER — PANTOPRAZOLE SODIUM 40 MG PO TBEC
40.0000 mg | DELAYED_RELEASE_TABLET | Freq: Every day | ORAL | Status: DC
  Administered 2024-06-03: 40 mg via ORAL
  Filled 2024-06-03: qty 1

## 2024-06-03 MED ORDER — SENNA 8.6 MG PO TABS
1.0000 | ORAL_TABLET | Freq: Two times a day (BID) | ORAL | 0 refills | Status: DC | PRN
  Filled 2024-06-03: qty 30, 15d supply, fill #0

## 2024-06-03 NOTE — Evaluation (Signed)
 Physical Therapy Evaluation Patient Details Name: Mary Sanford MRN: [id number] DOB: Oct 10, 1965 Today's Date: 06/03/2024  History of Present Illness  Mary Sanford is here for Laminectomy for postoperative CSF leak and is currently 1 Day Post-Op.  Clinical Impression  Pt is a very pleasant 58 y/o female admitted for postoperative CSF leak, c/o HA and pain in the back that referred down bilateral LE. Pt was alert and moving around her room independently upon SPT arrival and was motivated to do some walking. Pt was able to ambulate for 300 ft without AD and with supervision. She did not experience any increase in pain or prior symptoms. Pt is at her baseline and is cleared by PT for discharge.        If plan is discharge home, recommend the following:     Can travel by private vehicle        Equipment Recommendations None recommended by PT  Recommendations for Other Services       Functional Status Assessment Patient has had a recent decline in their functional status and demonstrates the ability to make significant improvements in function in a reasonable and predictable amount of time.     Precautions / Restrictions Precautions Precautions: None Restrictions Weight Bearing Restrictions Per Provider Order: No      Mobility  Bed Mobility Overal bed mobility: Independent                  Transfers Overall transfer level: Independent Equipment used: None                    Ambulation/Gait Ambulation/Gait assistance: Supervision Gait Distance (Feet): 300 Feet Assistive device: None Gait Pattern/deviations: WFL(Within Functional Limits)       General Gait Details: ambulate 300 ft with supervision without LOB, SOB or exacerbating pain.  Stairs            Wheelchair Mobility     Tilt Bed    Modified Rankin (Stroke Patients Only)       Balance Overall balance assessment: Independent                                            Pertinent Vitals/Pain Pain Assessment Pain Assessment: 0-10 Pain Score: 8  Pain Location: low back Pain Descriptors / Indicators: Aching, Discomfort Pain Intervention(s): Monitored during session    Home Living Family/patient expects to be discharged to:: Private residence Living Arrangements: Spouse/significant other;Children Available Help at Discharge: Family;Available 24 hours/day Type of Home: House Home Access: Level entry       Home Layout: Two level;Bed/bath upstairs;1/2 bath on main level Home Equipment: Rolling Walker (2 wheels);Cane - single point;BSC/3in1;Crutches      Prior Function Prior Level of Function : Independent/Modified Independent;Working/employed;Driving             Mobility Comments: Indpendent, has access to a walker, and crutches., ADLs Comments: 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     Extremity/Trunk Assessment   Upper Extremity Assessment Upper Extremity Assessment: Overall WFL for tasks assessed    Lower Extremity Assessment Lower Extremity Assessment: Overall WFL for tasks assessed    Cervical / Trunk Assessment Cervical / Trunk Assessment: Normal  Communication   Communication Communication: No apparent difficulties    Cognition Arousal: Alert Behavior During Therapy: WFL for tasks assessed/performed   PT - Cognitive impairments: No  apparent impairments                         Following commands: Intact       Cueing Cueing Techniques: Verbal cues     General Comments      Exercises Other Exercises Other Exercises: gait 300 ft with supervision   Assessment/Plan    PT Assessment Patient does not need any further PT services  PT Problem List         PT Treatment Interventions      PT Goals (Current goals can be found in the Care Plan section)  Acute Rehab PT Goals Patient Stated Goal: To return home PT Goal Formulation: With patient Time For Goal Achievement: 06/17/24 Potential to  Achieve Goals: Good    Frequency       Co-evaluation               AM-PAC PT 6 Clicks Mobility  Outcome Measure Help needed turning from your back to your side while in a flat bed without using bedrails?: None Help needed moving from lying on your back to sitting on the side of a flat bed without using bedrails?: None Help needed moving to and from a bed to a chair (including a wheelchair)?: None Help needed standing up from a chair using your arms (e.g., wheelchair or bedside chair)?: None Help needed to walk in hospital room?: None Help needed climbing 3-5 steps with a railing? : None 6 Click Score: 24    End of Session   Activity Tolerance: Patient tolerated treatment well;No increased pain Patient left: in bed;with call bell/phone within reach Nurse Communication: Mobility status PT Visit Diagnosis: Pain    Time: 1310-1320 PT Time Calculation (min) (ACUTE ONLY): 10 min   Charges:   PT Evaluation $PT Eval Low Complexity: 1 Low   PT General Charges $$ ACUTE PT VISIT: 1 Visit         Allena Bulls, SPT   Allena Bulls 06/03/2024, 2:47 PM

## 2024-06-03 NOTE — Progress Notes (Signed)
 PHARMACIST - PHYSICIAN COMMUNICATION CONCERNING: IV to Oral Route Change Policy  RECOMMENDATION: This patient is receiving pantoprazole  intravenously. Based on criteria approved by the Pharmacy and Therapeutics Committee, the intravenous medication(s) is/are being converted to the equivalent dose of an oral formulation.   DESCRIPTION: These criteria include: The patient is eating (either orally or via tube) and/or has been taking other orally administered medications for at least 24 hours. The patient has no evidence of active gastrointestinal bleeding or malabsorptive GI state (gastrectomy; short bowel; patient on TNA or NPO).  If you have questions about this conversion, please contact the Pharmacy Department:  [x]   [phone] )  [facility] []   [phone] )  [facility] []   [phone] )  [facility] []   [phone] )  [facility]   Will M. Lenon, PharmD, BCPS Clinical Pharmacist 06/03/2024 2:13 PM

## 2024-06-03 NOTE — Progress Notes (Signed)
 1036 Pt refusing to stay laying in bed until 12pm as ordered by MD due to her having to have a BM and not wanting to stay in bed. Pt ambulated to bathroom without complication or headache. Per MD bedrest order will be lifted.

## 2024-06-03 NOTE — Plan of Care (Signed)
   Problem: Education: Goal: Knowledge of General Education information will improve Description: Including pain rating scale, medication(s)/side effects and non-pharmacologic comfort measures Outcome: Progressing   Problem: Clinical Measurements: Goal: Will remain free from infection Outcome: Progressing   Problem: Clinical Measurements: Goal: Respiratory complications will improve Outcome: Progressing   Problem: Clinical Measurements: Goal: Cardiovascular complication will be avoided Outcome: Progressing   Problem: Pain Managment: Goal: General experience of comfort will improve and/or be controlled Outcome: Progressing   Problem: Safety: Goal: Ability to remain free from injury will improve Outcome: Progressing

## 2024-06-03 NOTE — Discharge Instructions (Addendum)
 NEUROSURGERY DISCHARGE INSTRUCTIONS  Admission diagnosis: Status post lumbar laminectomy [Z98.890] Postoperative CSF leak [G97.82, G96.00] CSF leak [G96.00]  Operative procedure: Repair of spinal fluid leak  What to do after you leave the hospital:  Recommended diet: regular diet. Increase protein intake to promote wound healing.  Recommended activity: no lifting, driving, or strenuous exercise for 4 weeks. You should walk multiple times per day  Special Instructions  No straining, no heavy lifting > 10lbs x 4 weeks.  Keep incision area clean and dry. May shower tomorrow 06/05/2024. No baths or pools for 3 weeks.  Please remove dressing tomorrow or 06/06/2024, no need to apply a bandage afterwards  You have sutures or staples that will be removed in clinic.   Please take pain medications as directed. Take a stool softener if on pain medications  *Regarding compression stockings-  Please wear day and night until you are walking a couple hundred feet three times a day.   Please Report any of the following: Nausea or Vomiting, Temperature is greater than 101.79F (38.1C) degrees, Dizziness, Abdominal Pain, Difficulty Breathing or Shortness of Breath, Inability to Eat, drink Fluids, or Take medications, Bleeding, swelling, or drainage from surgical incision sites, New numbness or weakness, and Bowel or bladder dysfunction to the neurosurgeon on call. How to contact us :  If you have any questions/concerns before or after surgery, you can reach us  at [phone], or you can send a mychart message. We can be reached by phone or mychart 8am-4pm, Monday-Friday.  *Please note: Calls after 4pm are forwarded to a third party answering service. Mychart messages are not routinely monitored during evenings, weekends, and holidays. Please call our office to contact the answering service for urgent concerns during non-business hours.   Additional Follow up appointments Please follow up with Lyle Decamp  PA-C as scheduled in 2-3 weeks   Please see below for scheduled appointments:  Future Appointments  Date Time Provider Department Center  06/06/2024  1:00 PM [facility] PHARMACIST [facility]  06/15/2024  3:30 PM Decamp Lyle, PA-C [facility]  07/13/2024  9:30 AM Claudene Penne ORN, MD [facility]  08/22/2024  9:00 AM Decamp Lyle, PA-C [facility]  11/10/2024 10:00 AM [facility]MR 2 [facility]

## 2024-06-03 NOTE — Progress Notes (Signed)
 Attending Progress Note  History: Mary Sanford is here for Laminectomy for postoperative CSF leak , they are currently 1 Day Post-Op.   POD1: Overall doing well.  Her headache is much better.  Having expected postoperative back pain.  Physical Exam: Vitals:   06/03/24 0352 06/03/24 0744  BP: 128/80 133/77  Pulse: 68 68  Resp: 18 17  Temp: 97.8 F (36.6 C) 98.2 F (36.8 C)  SpO2: 96% 98%    AA Ox3 CNI  Strength: No new major deficits noted   Data:  Recent Labs  Lab 05/29/24 1310 05/30/24 0601  NA 139 140  K 3.9 4.2  CL 103 107  CO2 24 23  BUN 10 10  CREATININE 0.70 0.73  GLUCOSE 144* 128*  CALCIUM  10.4* 9.5   No results for input(s): AST, ALT, ALKPHOS in the last 168 hours.  Invalid input(s): TBILI   Recent Labs  Lab 05/29/24 1310 05/30/24 0601  WBC 5.6 5.8  HGB 13.3 13.0  HCT 42.8 41.3  PLT 259 244   No results for input(s): APTT, INR in the last 168 hours.      No results found.  Other tests/results: None  Assessment/Plan:  Mary Sanford with recent CSF leak repair overall doing well on POD 1.   - Drains: None - HOB Flat until Noon 10/31, okay to elavate to 30 for meals.  - pain control - DVT prophylaxis - PTOT  Penne MICAEL Sharps, MD/MSCR Department of Neurosurgery

## 2024-06-03 NOTE — Evaluation (Signed)
 Occupational Therapy Evaluation Patient Details Name: Mary Sanford MRN: [id number] DOB: 14-Sep-1965 Today's Date: 06/03/2024   History of Present Illness   Mary Sanford is here for Laminectomy for postoperative CSF leak and is currently 1 Day Post-Op.     Clinical Impressions Upon entering the room, pt supine in bed with RN present giving medications. Pt is pleasant and cooperative and is agreeable to OT evaluation. Pt reports being Ind at baseline and living with family. Pt correctly reports back precautions with 100% accuracy. Pt performs bed mobility without assistance. OT educated pt on use of figure four position to don/doff B LE clothing independently with pt returning demonstrations. Pt demonstrates ability to exit bed and ambulate to bathroom and demonstrates toilet transfers independently without issue. Pt does not need physical assistance this session. She returns to bed. OT does recommend she get LH sponge for bathing in order to maintain precautions. Pt with no further skilled acute OT needs and OT to sign off at this time. Pt agrees.      If plan is discharge home, recommend the following:         Functional Status Assessment   Patient has not had a recent decline in their functional status     Equipment Recommendations   None recommended by OT      Precautions/Restrictions   Precautions Precautions: Fall;Back Precaution/Restrictions Comments: no brace needed     Mobility Bed Mobility Overal bed mobility: Independent                  Transfers Overall transfer level: Independent                            ADL either performed or assessed with clinical judgement   ADL Overall ADL's : Independent                                             Vision Patient Visual Report: No change from baseline              Pertinent Vitals/Pain Pain Assessment Pain Assessment: Faces Faces Pain Scale: Hurts little  more Pain Location: low back Pain Descriptors / Indicators: Aching, Discomfort Pain Intervention(s): Limited activity within patient's tolerance, Premedicated before session, Repositioned     Extremity/Trunk Assessment Upper Extremity Assessment Upper Extremity Assessment: Overall WFL for tasks assessed   Lower Extremity Assessment Lower Extremity Assessment: Overall WFL for tasks assessed       Communication Communication Communication: No apparent difficulties   Cognition Arousal: Alert Behavior During Therapy: WFL for tasks assessed/performed Cognition: No apparent impairments                               Following commands: Intact                  Home Living Family/patient expects to be discharged to:: Private residence Living Arrangements: Spouse/significant other;Children Available Help at Discharge: Family;Available 24 hours/day Type of Home: House Home Access: Level entry     Home Layout: Two level;Bed/bath upstairs;1/2 bath on main level     Bathroom Shower/Tub: Walk-in shower         Home Equipment: Agricultural Consultant (2 wheels);Cane - single point;BSC/3in1;Crutches          Prior  Functioning/Environment Prior Level of Function : Independent/Modified Independent;Working/employed;Driving               ADLs Comments: works part time at [employer] in [employer] goals can be found in the care plan section)   Acute Rehab OT Goals Patient Stated Goal: to go home OT Goal Formulation: With patient Time For Goal Achievement: 06/03/24 Potential to Achieve Goals: Good   AM-PAC OT 6 Clicks Daily Activity     Outcome Measure Help from another person eating meals?: None Help from another person taking care of personal grooming?: None Help from another person toileting, which includes using toliet, bedpan, or urinal?: None Help from another person bathing (including washing, rinsing, drying)?: None Help  from another person to put on and taking off regular upper body clothing?: None Help from another person to put on and taking off regular lower body clothing?: None 6 Click Score: 24   End of Session    Activity Tolerance: Patient tolerated treatment well Patient left: in bed;with call bell/phone within reach                   Time: 8669-8657 OT Time Calculation (min): 12 min Charges:  OT General Charges $OT Visit: 1 Visit OT Evaluation $OT Eval Low Complexity: 1 Low Izetta Claude, MS, OTR/L , CBIS ascom [phone]  06/03/24, 2:29 PM

## 2024-06-04 ENCOUNTER — Other Ambulatory Visit: Payer: Self-pay

## 2024-06-04 DIAGNOSIS — Z87891 Personal history of nicotine dependence: Secondary | ICD-10-CM | POA: Diagnosis not present

## 2024-06-04 DIAGNOSIS — G9600 Cerebrospinal fluid leak, unspecified: Secondary | ICD-10-CM | POA: Diagnosis not present

## 2024-06-04 DIAGNOSIS — Y838 Other surgical procedures as the cause of abnormal reaction of the patient, or of later complication, without mention of misadventure at the time of the procedure: Secondary | ICD-10-CM | POA: Diagnosis not present

## 2024-06-04 DIAGNOSIS — G9609 Other spinal cerebrospinal fluid leak: Secondary | ICD-10-CM | POA: Diagnosis not present

## 2024-06-04 DIAGNOSIS — I10 Essential (primary) hypertension: Secondary | ICD-10-CM | POA: Diagnosis not present

## 2024-06-04 LAB — CBC
HCT: 37.9 % (ref 36.0–46.0)
Hemoglobin: 11.9 g/dL — ABNORMAL LOW (ref 12.0–15.0)
MCH: 23.4 pg — ABNORMAL LOW (ref 26.0–34.0)
MCHC: 31.4 g/dL (ref 30.0–36.0)
MCV: 74.6 fL — ABNORMAL LOW (ref 80.0–100.0)
Platelets: 266 K/uL (ref 150–400)
RBC: 5.08 MIL/uL (ref 3.87–5.11)
RDW: 14.8 % (ref 11.5–15.5)
WBC: 8 K/uL (ref 4.0–10.5)
nRBC: 0 % (ref 0.0–0.2)

## 2024-06-04 NOTE — Discharge Summary (Signed)
 Physician Discharge Summary  Patient ID: Mary Sanford MRN: [id number] DOB/AGE: 1966-07-10 58 y.o.  Admit date: 06/02/2024 Discharge date: 06/04/2024  Admission Diagnoses:   Postoperative CSF leak   Status post lumbar laminectomy  Discharge Diagnoses:  Principal Problem:   CSF leak Active Problems:   Postoperative CSF leak   Status post lumbar laminectomy   Discharged Condition: good  Hospital Course: Mary Sanford was admitted for CSF leak repair.  She tolerated the procedure well and was admitted on bedrest.  She ambulated on POD1 and was felt to be stable for discharge on POD2.  Consults: None  Significant Diagnostic Studies: none  Treatments: surgery: CSF leak repair  Discharge Exam: Blood pressure 127/76, pulse 66, temperature 98.6 F (37 C), resp. rate 16, height 5' 9 (1.753 m), weight 89.4 kg, last menstrual period 05/15/2021, SpO2 98%. General appearance: alert and cooperative MAEW 5/5 Incision c/d/i  Disposition: Discharge disposition: 01-Home or Self Care       Discharge Instructions     Discharge patient   Complete by: As directed    Discharge disposition: 01-Home or Self Care   Discharge patient date: 06/04/2024   Incentive spirometry RT   Complete by: As directed       Allergies as of 06/04/2024       Reactions   Naproxen  Other (See Comments)   Panic attacks No Problem with Ibuprofen /Motrin         Medication List     STOP taking these medications    oxyCODONE -acetaminophen  10-325 MG tablet Commonly known as: PERCOCET       TAKE these medications    acetaminophen  500 MG tablet Commonly known as: TYLENOL  Take 2 tablets (1,000 mg total) by mouth every 6 (six) hours.   amLODipine  10 MG tablet Commonly known as: NORVASC  Take 1 tablet (10 mg total) by mouth daily.   atorvastatin  10 MG tablet Commonly known as: LIPITOR Take 1 tablet by mouth once daily   BEET ROOT PO Take 2 tablets by mouth daily.   escitalopram  20 MG  tablet Commonly known as: LEXAPRO  Take 1 tablet by mouth once daily   gabapentin  300 MG capsule Commonly known as: Neurontin  Take 1 capsule (300 mg total) by mouth 3 (three) times daily.   meloxicam  7.5 MG tablet Commonly known as: Mobic  Take 1 tablet (7.5 mg total) by mouth daily.   nebivolol  10 MG tablet Commonly known as: BYSTOLIC  Take 1 tablet by mouth once daily   oxyCODONE  5 MG immediate release tablet Commonly known as: Oxy IR/ROXICODONE  Take 1-2 tablets (5-10 mg total) by mouth every 3 (three) hours as needed for up to 5 days for moderate pain (pain score 4-6) or severe pain (pain score 7-10).   polyethylene glycol powder 17 GM/SCOOP powder Commonly known as: MiraLax  Take 17 g by mouth daily as needed for moderate constipation. Dissolve 1 capful (17g) in 4-8 ounces of liquid and take by mouth daily.   senna 8.6 MG Tabs tablet Commonly known as: SENOKOT Take 1 tablet (8.6 mg total) by mouth 2 (two) times daily as needed for mild constipation.   spironolactone  50 MG tablet Commonly known as: ALDACTONE  Take 1 tablet by mouth once daily   tiZANidine  2 MG tablet Commonly known as: ZANAFLEX  Take 1-2 tablets (2-4 mg total) by mouth every 8 (eight) hours as needed for muscle spasms.         SignedBETHA REEVES DAISY 06/04/2024, 10:00 AM

## 2024-06-04 NOTE — Progress Notes (Signed)
 Attending Progress Note  History: Mary Sanford is here for Laminectomy for postoperative CSF leak , they are currently 2 Days Post-Op.   POD2: Minor headache is improved. POD1: Overall doing well.  Her headache is much better.  Having expected postoperative back pain.  Physical Exam: Vitals:   06/04/24 0452 06/04/24 0747  BP: (!) 149/85 127/76  Pulse: 61 66  Resp: 18 16  Temp: 98.7 F (37.1 C) 98.6 F (37 C)  SpO2: 97% 98%    AA Ox3 CNI  Strength: No new major deficits noted   Data:  Recent Labs  Lab 05/29/24 1310 05/30/24 0601  NA 139 140  K 3.9 4.2  CL 103 107  CO2 24 23  BUN 10 10  CREATININE 0.70 0.73  GLUCOSE 144* 128*  CALCIUM  10.4* 9.5   No results for input(s): AST, ALT, ALKPHOS in the last 168 hours.  Invalid input(s): TBILI   Recent Labs  Lab 05/29/24 1310 05/30/24 0601 06/04/24 0556  WBC 5.6 5.8 8.0  HGB 13.3 13.0 11.9*  HCT 42.8 41.3 37.9  PLT 259 244 266   No results for input(s): APTT, INR in the last 168 hours.      No results found.  Other tests/results: None  Assessment/Plan:  Randine JONETTA Manns with recent CSF leak repair overall doing well on POD 2.   - Drains: None - pain control - DVT prophylaxis - PTOT  Reeves Daisy MD Department of Neurosurgery

## 2024-06-04 NOTE — Progress Notes (Cosign Needed)
 Patient was given verbal and written D/C instructions. Patient acknowledged understanding and states compliance. IV removed from left forearm, dry dressing applied. Waiting for meds to bed from pharmacy to be D/C.

## 2024-06-04 NOTE — Plan of Care (Signed)
  Problem: Education: Goal: Knowledge of General Education information will improve Description: Including pain rating scale, medication(s)/side effects and non-pharmacologic comfort measures Outcome: Progressing   Problem: Activity: Goal: Risk for activity intolerance will decrease Outcome: Progressing   Problem: Nutrition: Goal: Adequate nutrition will be maintained Outcome: Progressing   Problem: Pain Managment: Goal: General experience of comfort will improve and/or be controlled Outcome: Progressing   Problem: Pain Management: Goal: Pain level will decrease Outcome: Progressing

## 2024-06-06 ENCOUNTER — Other Ambulatory Visit: Admitting: Pharmacist

## 2024-06-06 DIAGNOSIS — I10 Essential (primary) hypertension: Secondary | ICD-10-CM

## 2024-06-06 NOTE — Progress Notes (Signed)
 06/06/2024  Name: Mary Sanford MRN: [id number] DOB: May 19, 1966  Chief Complaint  Patient presents with   Hypertension   Medication Management   Pain/headache pain improved after surgery  Mary Sanford is a 58 y.o. year old female who presented for a telephone visit.   They were referred to the pharmacist by their PCP for assistance in managing hypertension.   Subjective:  Care Team: Primary Care Provider: Joshua Debby CROME, MD ; Next Scheduled Visit: 07/23/2023 Awaiting call to schedule Endorinology consult  Medication Access/Adherence  Current Pharmacy:  [facility] [address] ([place]), [place] - [address] [place] [address] Phone: [phone] Fax: [phone]  [facility] [address] [place] [address] Phone: [phone] Fax: [phone]   Patient reports affordability concerns with their medications: No  Patient reports access/transportation concerns to their pharmacy: No  Patient reports adherence concerns with their medications:  No    Recent hospitalization and surgery for CSF leak after lumbar laminectomy. Discharged 06/04/24. She reports headache pain she was experiencing after surgery resolved immediately. Still experiencing back pain, seeming to be getting better.  Hypertension:  Current medications: amlodipine  10 mg daily, nebivolol  10 mg daily, spironolactone  50 mg daily Medications previously tried: Dyazide (changed to spironolactone  due to new dx of hyperaldosteronism from 11/7 labs)  Patient does not have a validated, automated, upper arm home BP cuff Current blood pressure readings using a wrist monitor: has not checked due to frequent appts  Patient denies hypotensive s/sx including dizziness, lightheadedness.  Patient  denies hypertensive symptoms including headache, denies chest pain, shortness of breath  Objective:  BP Readings from  Last 3 Encounters:  06/04/24 127/76  05/30/24 137/89  05/23/24 (!) 140/96     Lab Results  Component Value Date   HGBA1C 6.1 02/16/2024    Lab Results  Component Value Date   CREATININE 0.73 05/30/2024   BUN 10 05/30/2024   NA 140 05/30/2024   K 4.2 05/30/2024   CL 107 05/30/2024   CO2 23 05/30/2024    Lab Results  Component Value Date   CHOL 121 09/10/2023   HDL 57.70 09/10/2023   LDLCALC 11 09/10/2023   TRIG 263.0 (H) 09/10/2023   CHOLHDL 2 09/10/2023    Medications Reviewed Today     Reviewed by Merceda Lela SAUNDERS, RPH (Pharmacist) on 06/06/24 at 1501  Med List Status: <None>   Medication Order Taking? Sig Documenting Provider Last Dose Status Informant  acetaminophen  (TYLENOL ) 500 MG tablet [id number] Yes Take 2 tablets (1,000 mg total) by mouth every 6 (six) hours. Gregory Edsel Ruth, PA  Active   amLODipine  (NORVASC ) 10 MG tablet [id number] Yes Take 1 tablet (10 mg total) by mouth daily. Joshua Debby CROME, MD  Active Self  atorvastatin  (LIPITOR) 10 MG tablet [id number]  Take 1 tablet by mouth once daily Joshua Debby CROME, MD  Active Self  escitalopram  (LEXAPRO ) 20 MG tablet [id number]  Take 1 tablet by mouth once daily Joshua Debby CROME, MD  Active Self  gabapentin  (NEURONTIN ) 300 MG capsule [id number]  Take 1 capsule (300 mg total) by mouth 3 (three) times daily. Ulis Bottcher, PA-C  Active Self  meloxicam  (MOBIC ) 7.5 MG tablet [id number]  Take 1 tablet (7.5 mg total) by mouth daily. Ulis Bottcher, PA-C  Active Self  Misc Natural Products (BEET ROOT PO) [id number]  Take 2 tablets by mouth daily. [provider]  Active Self  nebivolol  (  BYSTOLIC ) 10 MG tablet [id number] Yes Take 1 tablet by mouth once daily Joshua Debby CROME, MD  Active Self  oxyCODONE  (OXY IR/ROXICODONE ) 5 MG immediate release tablet [id number] Yes Take 1-2 tablets (5-10 mg total) by mouth every 3 (three) hours as needed for up to 5 days for moderate pain (pain score 4-6) or severe pain (pain score  7-10). Gregory Edsel Ruth, PA  Active   polyethylene glycol powder (MIRALAX ) 17 GM/SCOOP powder [id number]  Take 17 g by mouth daily as needed for moderate constipation. Dissolve 1 capful (17g) in 4-8 ounces of liquid and take by mouth daily. Gregory Edsel Ruth, PA  Active   senna (SENOKOT) 8.6 MG TABS tablet [id number]  Take 1 tablet (8.6 mg total) by mouth 2 (two) times daily as needed for mild constipation. Gregory Edsel Ruth, PA  Active   spironolactone  (ALDACTONE ) 50 MG tablet [id number] Yes Take 1 tablet by mouth once daily Joshua Debby CROME, MD  Active Self  tiZANidine  (ZANAFLEX ) 2 MG tablet [id number] Yes Take 1-2 tablets (2-4 mg total) by mouth every 8 (eight) hours as needed for muscle spasms. Gregory Edsel Ruth, PA  Active               Assessment/Plan:   Hypertension: - Currently controlled, BP goal <130/80 - BP was fairly well controlled during last hospitalization since her amlodipine  was increased to 10 mg - Reviewed long term cardiovascular and renal outcomes of uncontrolled blood pressure - Reviewed appropriate blood pressure monitoring technique and reviewed goal blood pressure. Recommended to check home blood pressure and heart rate - Continue current regimen - Upcoming appts over the next 2-3 weeks for surgery follow up where BP will be checked. Advised pt to call if having concerns for elevated BP.    Follow Up Plan: PRN  Darrelyn Drum, PharmD, BCPS, CPP Clinical Pharmacist Practitioner [facility] [phone]

## 2024-06-06 NOTE — Patient Instructions (Signed)
 It was a pleasure speaking with you today!  Continue current regimen.  Feel free to call with any questions or concerns!  Darrelyn Drum, PharmD, BCPS, CPP Clinical Pharmacist Practitioner [facility] [phone]

## 2024-06-09 ENCOUNTER — Other Ambulatory Visit: Payer: Self-pay

## 2024-06-14 ENCOUNTER — Ambulatory Visit: Admitting: Internal Medicine

## 2024-06-15 ENCOUNTER — Ambulatory Visit (INDEPENDENT_AMBULATORY_CARE_PROVIDER_SITE_OTHER): Admitting: Physician Assistant

## 2024-06-15 ENCOUNTER — Other Ambulatory Visit: Payer: Self-pay | Admitting: Physician Assistant

## 2024-06-15 ENCOUNTER — Encounter: Payer: Self-pay | Admitting: Physician Assistant

## 2024-06-15 VITALS — BP 130/84 | Temp 98.5°F | Ht 69.0 in | Wt 196.0 lb

## 2024-06-15 DIAGNOSIS — G9782 Other postprocedural complications and disorders of nervous system: Secondary | ICD-10-CM

## 2024-06-15 DIAGNOSIS — Z09 Encounter for follow-up examination after completed treatment for conditions other than malignant neoplasm: Secondary | ICD-10-CM

## 2024-06-15 DIAGNOSIS — G9600 Cerebrospinal fluid leak, unspecified: Secondary | ICD-10-CM

## 2024-06-15 DIAGNOSIS — Z9889 Other specified postprocedural states: Secondary | ICD-10-CM

## 2024-06-15 MED ORDER — ACETAZOLAMIDE 125 MG PO TABS: 125.0000 mg | ORAL_TABLET | Freq: Two times a day (BID) | ORAL | 0 refills | Status: DC

## 2024-06-15 NOTE — Progress Notes (Signed)
   REFERRING PHYSICIAN:  Joshua Debby CROME, Md [address] [place],  [place] [address]  DOS: 04/22/24, L4-S1 lumbar laminectomy and decompression 06/02/24, CSF leak repair  HISTORY OF PRESENT ILLNESS: Mary Sanford is 5-month status post L4-S1 lumbar laminectomy decompression that was complicated by a delayed CSF leak.  She is now 2 weeks status post laminectomy for CSF leak repair.  However, patient came in today with worsening headaches over the past 3 days as well as increased swelling at her operative site over the past week.  In addition she also endorses increased pain when laying on her back that also increases her headaches.  No new weakness that she has noticed.     PHYSICAL EXAMINATION:  General: Patient is well developed, well nourished, calm, collected, and in no apparent distress.   NEUROLOGICAL:  General: In no acute distress.   Awake, alert, oriented to person, place, and time.  Pupils equal round and reactive to light.  Facial tone is symmetric.     Strength:            Side Iliopsoas Quads Hamstring PF DF EHL  R 5 5 5 5 5 5   L 5 5 5 5 5 5    Sutures have been left in place.  Patient does have swelling at incision site that feels though could be fluid.   ROS (Neurologic):  Negative except as noted above  IMAGING: No recent interval imaging  ASSESSMENT/PLAN:  Mary Sanford is 41-month status post L4-S1 lumbar laminectomy decompression that was complicated by a delayed CSF leak.  She is now 2 weeks status post laminectomy for CSF leak repair.  However, patient came in today with worsening headaches over the past 3 days as well as increased swelling at her operative site over the past week.  In addition she also endorses increased pain when laying on her back that also increases her headaches.  No new weakness that she has noticed.  She does have increased welling at incision site.  Dr. Claudene was able to come in for this appointment to discuss it further with  the patient.  We are concern for repeat CSF leak.  I went ahead and ordered an MRI of the lumbar spine without contrast for further evaluation.  This will need to be done with IV sedation as she is not able to lay flat on her back without a significant amount of pain.  Ideally patient started on Diamox to help decrease the amount of CSF she is making.  However patient is already on a diuretic, I have went ahead and reached out to her primary care provider about starting this and we will await his response.  As stated, we have left her sutures in for now.  She is still under lifting restrictions of nothing greater than 10 pounds.  As soon as her imaging is complete then we will review.  Red flag symptoms were reviewed at length with the patient.  If she starts to experience any of these I have advised her to go to the nearest emergency department.   Advised to contact the office if any questions or concerns arise.  Lyle Decamp PA-C Department of neurosurgery

## 2024-06-15 NOTE — Progress Notes (Unsigned)
 Confirmed with PCP that patient may start Diamox.  Will plan for her to hold the spironolactone .

## 2024-06-17 ENCOUNTER — Ambulatory Visit (HOSPITAL_COMMUNITY)
Admission: RE | Admit: 2024-06-17 | Discharge: 2024-06-17 | Disposition: A | Discharge status: Home / Self Care | Source: Ambulatory Visit | Attending: Physician Assistant | Admitting: Physician Assistant

## 2024-06-17 DIAGNOSIS — Z9889 Other specified postprocedural states: Secondary | ICD-10-CM | POA: Diagnosis present

## 2024-06-17 DIAGNOSIS — G9600 Cerebrospinal fluid leak, unspecified: Secondary | ICD-10-CM | POA: Insufficient documentation

## 2024-06-17 DIAGNOSIS — G9782 Other postprocedural complications and disorders of nervous system: Secondary | ICD-10-CM | POA: Insufficient documentation

## 2024-06-19 ENCOUNTER — Other Ambulatory Visit: Payer: Self-pay

## 2024-06-19 ENCOUNTER — Emergency Department: Admission: EM | Admit: 2024-06-19 | Discharge: 2024-06-19 | Disposition: A | Discharge status: Home / Self Care

## 2024-06-19 DIAGNOSIS — G8918 Other acute postprocedural pain: Secondary | ICD-10-CM | POA: Insufficient documentation

## 2024-06-19 DIAGNOSIS — M5442 Lumbago with sciatica, left side: Secondary | ICD-10-CM | POA: Insufficient documentation

## 2024-06-19 DIAGNOSIS — M545 Low back pain, unspecified: Secondary | ICD-10-CM | POA: Diagnosis not present

## 2024-06-19 LAB — BASIC METABOLIC PANEL WITH GFR
Anion gap: 8 (ref 5–15)
BUN: 18 mg/dL (ref 6–20)
CO2: 25 mmol/L (ref 22–32)
Calcium: 9.6 mg/dL (ref 8.9–10.3)
Chloride: 108 mmol/L (ref 98–111)
Creatinine, Ser: 0.79 mg/dL (ref 0.44–1.00)
GFR, Estimated: >60 mL/min (ref >60)
Glucose, Bld: 91 mg/dL (ref 70–99)
Potassium: 4.2 mmol/L (ref 3.5–5.1)
Sodium: 141 mmol/L (ref 135–145)

## 2024-06-19 LAB — CBC
HCT: 38.7 % (ref 36.0–46.0)
Hemoglobin: 12.1 g/dL (ref 12.0–15.0)
MCH: 23.4 pg — ABNORMAL LOW (ref 26.0–34.0)
MCHC: 31.3 g/dL (ref 30.0–36.0)
MCV: 75 fL — ABNORMAL LOW (ref 80.0–100.0)
Platelets: 220 K/uL (ref 150–400)
RBC: 5.16 MIL/uL — ABNORMAL HIGH (ref 3.87–5.11)
RDW: 15 % (ref 11.5–15.5)
WBC: 6.2 K/uL (ref 4.0–10.5)
nRBC: 0 % (ref 0.0–0.2)

## 2024-06-19 MED ORDER — MORPHINE SULFATE (PF) 4 MG/ML IV SOLN
4.0000 mg | Freq: Once | INTRAVENOUS | Status: AC
  Administered 2024-06-19: 4 mg via INTRAVENOUS
  Filled 2024-06-19: qty 1

## 2024-06-19 NOTE — ED Provider Notes (Addendum)
-----------------------------------------   4:52 PM on 06/19/2024 -----------------------------------------  I took over care of this patient from Dr. Fernand.  Dr. Claudene from neurosurgery has evaluated her.  He we will contact her tomorrow to confirm the follow-up plan.  He advises that she is appropriate for discharge.  On reassessment, she is sitting at the edge of the bed, fully dressed, and feels ready to go home.  She is stable for discharge at this time.  Her pain is well-controlled.  I gave strict return precautions, and she expressed understanding.   Jacolyn Pae, MD 06/19/24 1652    Jacolyn Pae, MD 06/19/24 [phone]

## 2024-06-19 NOTE — ED Notes (Signed)
 ED Provider at bedside.

## 2024-06-19 NOTE — ED Provider Notes (Signed)
 [facility] Provider Note    Event Date/Time   First MD Initiated Contact with Patient 06/19/24 1342     (approximate)   History   Back Pain and Post-op Problem   HPI  Mary Sanford is a 58 y.o. female presents with back pain.  She had a recent laminectomy done in September, this was complicated by a CSF leak which ended up getting surgically corrected by Dr. Claudene about 2 weeks ago.  At that time she had developed some weakness in her lower extremities left greater than right, since that time of discharge she has not had complete relief of symptoms, and continues to have worsening pain.  She has been taking oxycodone  at home without much relief of symptoms and today was much worse.  She had a repeat MRI done yesterday, which apparently showed worsening or recurrence of the CSF leak.  Given her symptomatology decided to present at this time.  She has not had any loss of control of her bowel or bladder, denies any numbness or tingling down her extremities has not had any falls but she states that she feels a little bit wobbly whenever she tries to walk.  No recent trauma no fevers no chills she does have some pain that radiates up to her neck and her head at times no associated nausea vomiting.      Physical Exam   Triage Vital Signs: ED Triage Vitals  Encounter Vitals Group     BP 06/19/24 1307 (!) 125/103     Girls Systolic BP Percentile --      Girls Diastolic BP Percentile --      Boys Systolic BP Percentile --      Boys Diastolic BP Percentile --      Pulse Rate 06/19/24 1307 83     Resp 06/19/24 1307 20     Temp 06/19/24 1307 (!) 97.5 F (36.4 C)     Temp Source 06/19/24 1307 Oral     SpO2 06/19/24 1307 98 %     Weight 06/19/24 1306 196 lb 3.4 oz (89 kg)     Height 06/19/24 1306 5' 9 (1.753 m)     Head Circumference --      Peak Flow --      Pain Score 06/19/24 1305 10     Pain Loc --      Pain Education --      Exclude from Growth Chart --      Most recent vital signs: Vitals:   06/19/24 1307  BP: (!) 125/103  Pulse: 83  Resp: 20  Temp: (!) 97.5 F (36.4 C)  SpO2: 98%     General: Awake, laying on the side, does appear to be uncomfortable CV:  Good peripheral perfusion.  Resp:  Normal effort.  Abd:  No distention.  MSK:  Surgical site along the lumbar spine does appear to have a hematoma develop without any evidence of discharge from the recent surgical site.  Sutures are still present and appears clean. Other:     ED Results / Procedures / Treatments   Labs (all labs ordered are listed, but only abnormal results are displayed) Labs Reviewed  CBC - Abnormal; Notable for the following components:      Result Value   RBC 5.16 (*)    MCV 75.0 (*)    MCH 23.4 (*)    All other components within normal limits  BASIC METABOLIC PANEL WITH GFR  EKG     RADIOLOGY   PROCEDURES:  Critical Care performed: No  Procedures   MEDICATIONS ORDERED IN ED: Medications  morphine  (PF) 4 MG/ML injection 4 mg (has no administration in time range)  morphine  (PF) 4 MG/ML injection 4 mg (4 mg Intravenous Given 06/19/24 1420)     IMPRESSION / MDM / ASSESSMENT AND PLAN / ED COURSE  I reviewed the triage vital signs and the nursing notes.                               Patient's presentation is most consistent with acute, uncomplicated illness.  58 year old female who presents today with concern of back pain, having some trouble with ambulation secondary to what is believed to be a possible from a CSF leak.  I did review her recent discharge summary as well as her MRI that was done yesterday.  Given the finding will attempt pain management and will reach out to neurosurgery to discuss recommendations and management.   Clinical Course as of 06/19/24 1520  Sun Jun 19, 2024  1408 Spoke with Dr. Claudene, given her presentation feels can likely schedule for an outpatient procedure for her.  We will attempt pain  management for the patient to ensure she is able to ambulate with anticipated discharge. [SK]  1519 Patient continues with pain, will give her second dose of morphine  here and reassess. [SK]    Clinical Course User Index [SK] Fernand Rossie HERO, MD     FINAL CLINICAL IMPRESSION(S) / ED DIAGNOSES   Final diagnoses:  Post-operative pain  Acute midline low back pain with left-sided sciatica     Rx / DC Orders   ED Discharge Orders     None        Note:  This document was prepared using Dragon voice recognition software and may include unintentional dictation errors.   Fernand Rossie HERO, MD 06/19/24 1520

## 2024-06-19 NOTE — Discharge Instructions (Signed)
 Follow-up as instructed by Dr. Claudene.  Return to the ER for new, worsening, or persistent severe pain, weakness or numbness, fever, or any other new or worsening symptoms that concern you.

## 2024-06-19 NOTE — ED Triage Notes (Signed)
 Pt to ED for lower back pain ongoing since laminectomy about 1 month ago. Laminectomy was for lumbar stenosis. Then came about 2 weeks ago and diagnosed with CSF leak. States having bilateral leg pain. No leg weakness or urinary incontinence. Pt also has constant HA and neck pain. Pt is ambulatory with steady gait.

## 2024-06-22 ENCOUNTER — Encounter: Payer: Self-pay | Admitting: Neurosurgery

## 2024-06-22 ENCOUNTER — Telehealth: Payer: Self-pay

## 2024-06-22 NOTE — Telephone Encounter (Signed)
 Received a secure chat that she called in requesting to schedule surgery. Austin informed the patient that we will call her back. Dr Claudene said he is going to discuss dates with Dr Clois to do the surgery together.

## 2024-06-23 ENCOUNTER — Other Ambulatory Visit: Payer: Self-pay | Admitting: Internal Medicine

## 2024-06-23 ENCOUNTER — Other Ambulatory Visit: Payer: Self-pay

## 2024-06-23 ENCOUNTER — Ambulatory Visit: Payer: Self-pay | Admitting: Neurosurgery

## 2024-06-23 ENCOUNTER — Encounter: Payer: Self-pay | Admitting: Neurosurgery

## 2024-06-23 ENCOUNTER — Encounter
Admission: RE | Admit: 2024-06-23 | Discharge: 2024-06-23 | Disposition: A | Discharge status: Home / Self Care | Source: Ambulatory Visit | Attending: Neurosurgery | Admitting: Neurosurgery

## 2024-06-23 DIAGNOSIS — G9600 Cerebrospinal fluid leak, unspecified: Secondary | ICD-10-CM

## 2024-06-23 DIAGNOSIS — I10 Essential (primary) hypertension: Secondary | ICD-10-CM

## 2024-06-23 DIAGNOSIS — Z9889 Other specified postprocedural states: Secondary | ICD-10-CM

## 2024-06-23 DIAGNOSIS — Z01812 Encounter for preprocedural laboratory examination: Secondary | ICD-10-CM

## 2024-06-23 DIAGNOSIS — Z01818 Encounter for other preprocedural examination: Secondary | ICD-10-CM

## 2024-06-23 HISTORY — DX: Other specified postprocedural states: Z98.890

## 2024-06-23 HISTORY — DX: Cerebrospinal fluid leak, unspecified: G96.00

## 2024-06-23 HISTORY — DX: Other postprocedural complications and disorders of nervous system: G97.82

## 2024-06-23 NOTE — Telephone Encounter (Signed)
 Sent patient mychart with surgical instructions listed below. Updated Post op appointments based on new surgery date. Notified PAT of surgery.  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     Surgery date: 06/27/24 at [facility]: [address], [place], [place] [address]) - you will find out your arrival time the business day before your surgery.     Pre-op appointment at [facility] Pre-admit Testing: you will receive a call with a date/time for this appointment. If you are scheduled for an in person appointment, Pre-admit Testing is located on the first floor of the [facility] building, [address], [address]. During this appointment, they will advise you which medications you can take the morning of surgery, and which medications you will need to hold for surgery. Labs (such as blood work, EKG) may be done at your pre-op appointment. You are not required to fast for these labs. Should you need to change your pre-op appointment, please call Pre-admit testing at [phone].        Common restrictions after spine surgery: No bending, lifting, or twisting ("BLT"). Avoid lifting objects heavier than 10 pounds for the first 6 weeks after surgery. Where possible, avoid household activities that involve lifting, bending, reaching, pushing, or pulling such as laundry, vacuuming, grocery shopping, and childcare. Try to arrange for help from friends and family for these activities while you heal. Do not drive while taking prescription pain medication. Weeks 6 through 12 after surgery: avoid lifting more than 25 pounds.            How to contact us :  If you have any questions/concerns before or after surgery, you can reach us  at [phone], or you can send a mychart message. We can be reached by phone or mychart 8am-4pm, Monday-Friday.  *Please note:  Calls after 4pm are forwarded to a third party answering service. Mychart messages are not routinely monitored during evenings, weekends, and holidays. Please call our office to contact the answering service for urgent concerns during non-business hours.       If you have FMLA/disability paperwork, please drop it off or fax it to [phone]     Appointments/FMLA & disability paperwork: Reche Hait, & Nichole Registered Nurse/Surgery scheduler: Kendelyn, RN & Katie, RN Certified Medical Assistants: Don, CMA, Elenor, CMA, Damien, CMA, & Auston, [place] Physician Assistants: Lyle Decamp, PA-C, Edsel Goods, PA-C & Glade Boys, PA-C Surgeons: Penne Sharps, MD & Reeves Daisy, MD     [facility] PREADMIT TESTING VISIT and SURGERY INFORMATION SHEET    Now that surgery has been scheduled you can anticipate several phone calls from [facility] services. A pharmacy technician will call you to verify your current list of medications taken at home.               The Pre-Service Center will call to verify your insurance information and to give you billing estimates and information.             The Preadmit Testing Office will be calling to schedule a visit to obtain information for the anesthesia team and provide instructions on preparation for surgery.   What can you expect for the Preadmit Testing Visit: Appointments may be scheduled in-person or by telephone.  If a telephone visit is scheduled, you may be asked to come into the office to have lab tests or other studies performed.   This visit  will not be completed any greater than 14 days prior to your surgery.  If your surgery has been scheduled for a future date, please do not be alarmed if we have not contacted you to schedule an appointment more than a month prior to the surgery date.     Please be prepared to provide the following information during this appointment:            -Personal medical history                                                -Medication and allergy list            -Any history of problems with anesthesia              -Recent lab work or diagnostic studies            -Please notify us  of any needs we should be aware of to provide the best care possible           -You will be provided with instructions on how to prepare for your surgery.       On The Day of Surgery:   You must have a driver to take you home after surgery, you will be asked not to drive for 24 hours following surgery.  Taxi, Gisele and non-medical transport will not be acceptable means of transportation unless you have a responsible individual who will be traveling with you.   Visitors in the surgical area:   2 people will be able to visit you in your room once your preparation for surgery has been completed. During surgery, your visitors will be asked to wait in the Surgery Waiting Area.  It is not a requirement for them to stay, if they prefer to leave and come back.  Your visitor(s) will be given an update once the surgery has been completed.   No visitors are allowed in the initial recovery room to respect patient privacy and safety.   Once you are more awake and transfer to the secondary recovery area, or are transferred to an inpatient room, visitors will again be able to see you.   To respect and protect your privacy: We will ask on the day of surgery who your driver will be and what the contact number for that individual will be. We will ask if it is okay to share information with this individual, or if there is an alternative individual that we, or the surgeon, should contact to provide updates and information. If family or friends come to the surgical information desk requesting information about you, who you have not listed with us , no information will be given.   It may be helpful to designate someone as the main contact who will be responsible for updating your other friends and family.      PREADMIT TESTING OFFICE: [phone] SAME DAY SURGERY: [phone] We look forward to caring for you before and throughout the process of your surgery.

## 2024-06-23 NOTE — Patient Instructions (Signed)
 Your procedure is scheduled on:06-27-24 Monday Report to the Registration Desk on the 1st floor of the [facility].Then proceed to the 2nd floor Surgery Desk To find out your arrival time, please call [phone] between 1PM - 3PM on:06-24-24 Friday If your arrival time is 6:00 am, do not arrive before that time as the Medical Mall entrance doors do not open until 6:00 am.  REMEMBER: Instructions that are not followed completely may result in serious medical risk, up to and including death; or upon the discretion of your surgeon and anesthesiologist your surgery may need to be rescheduled.  Do not eat food after midnight the night before surgery.  No gum chewing or hard candies.  You may however, drink CLEAR liquids up to 2 hours before you are scheduled to arrive for your surgery. Do not drink anything within 2 hours of your scheduled arrival time.  Clear liquids include: - water   - apple juice without pulp - gatorade (not RED colors) - black coffee or tea (Do NOT add milk or creamers to the coffee or tea) Do NOT drink anything that is not on this list.  One week prior to surgery:Stop NOW (06-23-24) Stop ANY OVER THE COUNTER supplements until after surgery (Beet Root)  Continue taking all of your other prescription medications up until the day of surgery.  ON THE DAY OF SURGERY ONLY TAKE THESE MEDICATIONS WITH SIPS OF WATER : -amLODipine  (NORVASC )  -escitalopram  (LEXAPRO )  -gabapentin  (NEURONTIN )  -nebivolol  (BYSTOLIC )   No Alcohol for 24 hours before or after surgery.  No Smoking including e-cigarettes for 24 hours before surgery.  No chewable tobacco products for at least 6 hours before surgery.  No nicotine patches on the day of surgery.  Do not use any recreational drugs for at least a week (preferably 2 weeks) before your surgery.  Please be advised that the combination of cocaine and anesthesia may have negative outcomes, up to and including death. If you test  positive for cocaine, your surgery will be cancelled.  On the morning of surgery brush your teeth with toothpaste and water , you may rinse your mouth with mouthwash if you wish. Do not swallow any toothpaste or mouthwash.  Use CHG Soap as directed on instruction sheet.  Do not wear jewelry, make-up, hairpins, clips or nail polish.  For welded (permanent) jewelry: bracelets, anklets, waist bands, etc.  Please have this removed prior to surgery.  If it is not removed, there is a chance that hospital personnel will need to cut it off on the day of surgery.  Do not wear lotions, powders, or perfumes.   Do not shave body hair from the neck down 48 hours before surgery.  Contact lenses, hearing aids and dentures may not be worn into surgery.  Do not bring valuables to the hospital. [facility] is not responsible for any missing/lost belongings or valuables.   Notify your doctor if there is any change in your medical condition (cold, fever, infection).  Wear comfortable clothing (specific to your surgery type) to the hospital.  After surgery, you can help prevent lung complications by doing breathing exercises.  Take deep breaths and cough every 1-2 hours. Your doctor may order a device called an Incentive Spirometer to help you take deep breaths. When coughing or sneezing, hold a pillow firmly against your incision with both hands. This is called "splinting." Doing this helps protect your incision. It also decreases belly discomfort.  If you are being admitted to the hospital overnight, leave  your suitcase in the car. After surgery it may be brought to your room.  In case of increased patient census, it may be necessary for you, the patient, to continue your postoperative care in the Same Day Surgery department.  If you are being discharged the day of surgery, you will not be allowed to drive home. You will need a responsible individual to drive you home and stay with you for 24 hours after  surgery.   If you are taking public transportation, you will need to have a responsible individual with you.  Please call the Pre-admissions Testing Dept. at [phone] if you have any questions about these instructions.  Surgery Visitation Policy:  Patients having surgery or a procedure may have two visitors.  Children under the age of 78 must have an adult with them who is not the patient.  Inpatient Visitation:    Visiting hours are 7 a.m. to 8 p.m. Up to four visitors are allowed at one time in a patient room. The visitors may rotate out with other people during the day.  One visitor age 10 or older may stay with the patient overnight and must be in the room by 8 p.m.                                                                                                             Preparing for Surgery with CHLORHEXIDINE  GLUCONATE (CHG) Soap  Chlorhexidine  Gluconate (CHG) Soap  o An antiseptic cleaner that kills germs and bonds with the skin to continue killing germs even after washing  o Used for showering the night before surgery and morning of surgery  Before surgery, you can play an important role by reducing the number of germs on your skin.  CHG (Chlorhexidine  gluconate) soap is an antiseptic cleanser which kills germs and bonds with the skin to continue killing germs even after washing.  Please do not use if you have an allergy to CHG or antibacterial soaps. If your skin becomes reddened/irritated stop using the CHG.  1. Shower the NIGHT BEFORE SURGERY with CHG soap.  2. If you choose to wash your hair, wash your hair first as usual with your normal shampoo.  3. After shampooing, rinse your hair and body thoroughly to remove the shampoo.  4. Use CHG as you would any other liquid soap. You can apply CHG directly to the skin and wash gently with a clean washcloth.  5. Apply the CHG soap to your body only from the neck down. Do not use on open wounds or open sores. Avoid  contact with your eyes, ears, mouth, and genitals (private parts). Wash face and genitals (private parts) with your normal soap.  6. Wash thoroughly, paying special attention to the area where your surgery will be performed.  7. Thoroughly rinse your body with warm water .  8. Do not shower/wash with your normal soap after using and rinsing off the CHG soap.  9. Do not use lotions, oils, etc., after showering with CHG.  10. Pat yourself dry  with a clean towel.  11. Wear clean pajamas to bed the night before surgery.  12. Place clean sheets on your bed the night of your shower and do not sleep with pets.  13. Do not apply any deodorants/lotions/powders.  14. Please wear clean clothes to the hospital.  15. Remember to brush your teeth with your regular toothpaste.   Merchandiser, Retail to address health-related social needs:  [email or web address]

## 2024-06-23 NOTE — Progress Notes (Signed)
 Called pt regarding upcoming surgery with Dr Claudene to go over surgery instructions. Updated med list (removed Spironolactone  as new med Diamox was added) Reviewed instructions with pt and she verbalized understanding. Pt aware to be looking for surgery instructions in My Chart

## 2024-06-23 NOTE — Telephone Encounter (Signed)
 I had a follow-up phone call with Ms. Bonsall.  We reviewed her MRI findings from her clinic appointment with Lyle Butters.  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  This is causing swelling in her low back and causing positional symptomatology.  We discussed that in order to treat this we would likely need to go back in again to see the source of the further leak.  We also have some concern that she may have a increased ICP or CSF type issue so I have started her on Diamox  therapy.  Will plan to have her come back to the OR with myself and my partner Dr. Katrina for a lumbar laminectomy, pseudomeningocele/CSF leak repair, and possible placement of a lumbar drain.  She would like to go forward with this procedure.  Will plan on working on scheduling and coordination.  Will reach back out to her once we have a full plan.  Risks and benefits were discussed.  She would like to go forward with the procedure

## 2024-06-24 ENCOUNTER — Other Ambulatory Visit: Payer: Self-pay | Admitting: Internal Medicine

## 2024-06-24 DIAGNOSIS — F418 Other specified anxiety disorders: Secondary | ICD-10-CM

## 2024-06-25 NOTE — Anesthesia Postprocedure Evaluation (Signed)
 Anesthesia Post Note  Patient: Mary Sanford  Procedure(s) Performed: Laminectomy for postoperative CSF leak (Spine Lumbar)  Patient location during evaluation: PACU Anesthesia Type: General Level of consciousness: awake and alert Pain management: pain level controlled Vital Signs Assessment: post-procedure vital signs reviewed and stable Respiratory status: spontaneous breathing, nonlabored ventilation, respiratory function stable and patient connected to nasal cannula oxygen Cardiovascular status: blood pressure returned to baseline and stable Postop Assessment: no apparent nausea or vomiting Anesthetic complications: no   No notable events documented.   Last Vitals:  Vitals:   06/04/24 0452 06/04/24 0747  BP: (!) 149/85 127/76  Pulse: 61 66  Resp: 18 16  Temp: 37.1 C 37 C  SpO2: 97% 98%    Last Pain:  Vitals:   06/04/24 1057  TempSrc:   PainSc: 8                  Prentice Murphy

## 2024-06-26 MED ORDER — CHLORHEXIDINE GLUCONATE 0.12 % MT SOLN
15.0000 mL | Freq: Once | OROMUCOSAL | Status: AC
  Administered 2024-06-27: 15 mL via OROMUCOSAL

## 2024-06-26 MED ORDER — CEFAZOLIN IN SODIUM CHLORIDE 2-0.9 GM/100ML-% IV SOLN
2.0000 g | Freq: Once | INTRAVENOUS | Status: DC
  Filled 2024-06-26: qty 100

## 2024-06-26 MED ORDER — LACTATED RINGERS IV SOLN: INTRAVENOUS | Status: DC

## 2024-06-26 MED ORDER — CEFAZOLIN SODIUM-DEXTROSE 2-4 GM/100ML-% IV SOLN
2.0000 g | INTRAVENOUS | Status: AC
  Administered 2024-06-27: 2 g via INTRAVENOUS

## 2024-06-26 MED ORDER — ORAL CARE MOUTH RINSE: 15.0000 mL | Freq: Once | OROMUCOSAL | Status: AC

## 2024-06-27 ENCOUNTER — Ambulatory Visit: Admitting: Anesthesiology

## 2024-06-27 ENCOUNTER — Telehealth: Payer: Self-pay

## 2024-06-27 ENCOUNTER — Ambulatory Visit

## 2024-06-27 ENCOUNTER — Encounter: Payer: Self-pay | Admitting: Neurosurgery

## 2024-06-27 ENCOUNTER — Other Ambulatory Visit: Payer: Self-pay

## 2024-06-27 ENCOUNTER — Inpatient Hospital Stay
Admission: AD | Admit: 2024-06-27 | Discharge: 2024-07-03 | DRG: 029 | Disposition: A | Discharge status: Home / Self Care | Attending: Neurosurgery | Admitting: Neurosurgery

## 2024-06-27 ENCOUNTER — Encounter
Admission: AD | Disposition: A | Discharge status: Home / Self Care | Payer: Self-pay | Source: Home / Self Care | Attending: Neurosurgery

## 2024-06-27 DIAGNOSIS — Z800 Family history of malignant neoplasm of digestive organs: Secondary | ICD-10-CM | POA: Diagnosis not present

## 2024-06-27 DIAGNOSIS — Z825 Family history of asthma and other chronic lower respiratory diseases: Secondary | ICD-10-CM | POA: Diagnosis not present

## 2024-06-27 DIAGNOSIS — Z83719 Family history of colon polyps, unspecified: Secondary | ICD-10-CM | POA: Diagnosis not present

## 2024-06-27 DIAGNOSIS — Z9884 Bariatric surgery status: Secondary | ICD-10-CM | POA: Diagnosis not present

## 2024-06-27 DIAGNOSIS — Z8249 Family history of ischemic heart disease and other diseases of the circulatory system: Secondary | ICD-10-CM | POA: Diagnosis not present

## 2024-06-27 DIAGNOSIS — F419 Anxiety disorder, unspecified: Secondary | ICD-10-CM | POA: Diagnosis not present

## 2024-06-27 DIAGNOSIS — R7303 Prediabetes: Secondary | ICD-10-CM | POA: Diagnosis not present

## 2024-06-27 DIAGNOSIS — Z90710 Acquired absence of both cervix and uterus: Secondary | ICD-10-CM | POA: Diagnosis not present

## 2024-06-27 DIAGNOSIS — G9609 Other spinal cerebrospinal fluid leak: Secondary | ICD-10-CM | POA: Diagnosis not present

## 2024-06-27 DIAGNOSIS — I10 Essential (primary) hypertension: Secondary | ICD-10-CM | POA: Diagnosis not present

## 2024-06-27 DIAGNOSIS — Z823 Family history of stroke: Secondary | ICD-10-CM | POA: Diagnosis not present

## 2024-06-27 DIAGNOSIS — I9581 Postprocedural hypotension: Secondary | ICD-10-CM | POA: Diagnosis not present

## 2024-06-27 DIAGNOSIS — G9782 Other postprocedural complications and disorders of nervous system: Secondary | ICD-10-CM | POA: Diagnosis present

## 2024-06-27 DIAGNOSIS — G9600 Cerebrospinal fluid leak, unspecified: Principal | ICD-10-CM | POA: Diagnosis present

## 2024-06-27 DIAGNOSIS — Z87891 Personal history of nicotine dependence: Secondary | ICD-10-CM | POA: Diagnosis not present

## 2024-06-27 DIAGNOSIS — G96198 Other disorders of meninges, not elsewhere classified: Secondary | ICD-10-CM | POA: Diagnosis not present

## 2024-06-27 DIAGNOSIS — F411 Generalized anxiety disorder: Secondary | ICD-10-CM | POA: Diagnosis not present

## 2024-06-27 DIAGNOSIS — F32A Depression, unspecified: Secondary | ICD-10-CM | POA: Diagnosis not present

## 2024-06-27 DIAGNOSIS — Y838 Other surgical procedures as the cause of abnormal reaction of the patient, or of later complication, without mention of misadventure at the time of the procedure: Secondary | ICD-10-CM | POA: Diagnosis not present

## 2024-06-27 DIAGNOSIS — Z79899 Other long term (current) drug therapy: Secondary | ICD-10-CM | POA: Diagnosis not present

## 2024-06-27 DIAGNOSIS — Z9889 Other specified postprocedural states: Secondary | ICD-10-CM

## 2024-06-27 DIAGNOSIS — M5441 Lumbago with sciatica, right side: Secondary | ICD-10-CM

## 2024-06-27 DIAGNOSIS — D509 Iron deficiency anemia, unspecified: Secondary | ICD-10-CM | POA: Diagnosis not present

## 2024-06-27 DIAGNOSIS — Z90722 Acquired absence of ovaries, bilateral: Secondary | ICD-10-CM | POA: Diagnosis not present

## 2024-06-27 DIAGNOSIS — Z87442 Personal history of urinary calculi: Secondary | ICD-10-CM | POA: Diagnosis not present

## 2024-06-27 DIAGNOSIS — R079 Chest pain, unspecified: Secondary | ICD-10-CM | POA: Diagnosis not present

## 2024-06-27 DIAGNOSIS — M5416 Radiculopathy, lumbar region: Secondary | ICD-10-CM | POA: Diagnosis not present

## 2024-06-27 DIAGNOSIS — Z9079 Acquired absence of other genital organ(s): Secondary | ICD-10-CM | POA: Diagnosis not present

## 2024-06-27 DIAGNOSIS — Z01812 Encounter for preprocedural laboratory examination: Secondary | ICD-10-CM

## 2024-06-27 DIAGNOSIS — E785 Hyperlipidemia, unspecified: Secondary | ICD-10-CM | POA: Diagnosis not present

## 2024-06-27 DIAGNOSIS — M48061 Spinal stenosis, lumbar region without neurogenic claudication: Secondary | ICD-10-CM

## 2024-06-27 DIAGNOSIS — Z01818 Encounter for other preprocedural examination: Secondary | ICD-10-CM

## 2024-06-27 DIAGNOSIS — Z833 Family history of diabetes mellitus: Secondary | ICD-10-CM | POA: Diagnosis not present

## 2024-06-27 HISTORY — PX: LAMINECTOMY FOR CEREBROSPINAL FLUID LEAK: SHX5886

## 2024-06-27 HISTORY — PX: PLACEMENT OF LUMBAR DRAIN: SHX6028

## 2024-06-27 LAB — CBC
HCT: 38.9 % (ref 36.0–46.0)
Hemoglobin: 12.4 g/dL (ref 12.0–15.0)
MCH: 23.4 pg — ABNORMAL LOW (ref 26.0–34.0)
MCHC: 31.9 g/dL (ref 30.0–36.0)
MCV: 73.5 fL — ABNORMAL LOW (ref 80.0–100.0)
Platelets: 205 K/uL (ref 150–400)
RBC: 5.29 MIL/uL — ABNORMAL HIGH (ref 3.87–5.11)
RDW: 15 % (ref 11.5–15.5)
WBC: 6.3 K/uL (ref 4.0–10.5)
nRBC: 0 % (ref 0.0–0.2)

## 2024-06-27 LAB — TYPE AND SCREEN
ABO/RH(D): B POS
Antibody Screen: NEGATIVE

## 2024-06-27 LAB — CREATININE, SERUM
Creatinine, Ser: 0.76 mg/dL (ref 0.44–1.00)
GFR, Estimated: >60 mL/min (ref >60)

## 2024-06-27 LAB — MRSA NEXT GEN BY PCR, NASAL: MRSA by PCR Next Gen: NOT DETECTED

## 2024-06-27 LAB — GLUCOSE, CAPILLARY: Glucose-Capillary: 136 mg/dL — ABNORMAL HIGH (ref 70–99)

## 2024-06-27 SURGERY — LAMINECTOMY FOR CEREBROSPINAL FLUID LEAK
Anesthesia: General | Site: Spine Lumbar

## 2024-06-27 MED ORDER — SUGAMMADEX SODIUM 200 MG/2ML IV SOLN
INTRAVENOUS | Status: DC | PRN
  Administered 2024-06-27: 200 mg via INTRAVENOUS

## 2024-06-27 MED ORDER — PHENYLEPHRINE 80 MCG/ML (10ML) SYRINGE FOR IV PUSH (FOR BLOOD PRESSURE SUPPORT)
PREFILLED_SYRINGE | INTRAVENOUS | Status: AC
Start: 2024-06-27 — End: 2024-06-27
  Filled 2024-06-27: qty 10

## 2024-06-27 MED ORDER — PHENOL 1.4 % MT LIQD: 1.0000 | OROMUCOSAL | Status: DC | PRN

## 2024-06-27 MED ORDER — AMLODIPINE BESYLATE 10 MG PO TABS
10.0000 mg | ORAL_TABLET | Freq: Every day | ORAL | Status: DC
  Administered 2024-06-28 – 2024-06-29 (×2): 10 mg via ORAL
  Filled 2024-06-27 (×2): qty 1

## 2024-06-27 MED ORDER — HYDROMORPHONE HCL 1 MG/ML IJ SOLN
0.5000 mg | INTRAMUSCULAR | Status: AC | PRN
  Administered 2024-06-27 (×4): 0.5 mg via INTRAVENOUS

## 2024-06-27 MED ORDER — BUPIVACAINE-EPINEPHRINE (PF) 0.5% -1:200000 IJ SOLN
INTRAMUSCULAR | Status: AC
Start: 2024-06-27 — End: 2024-06-27
  Filled 2024-06-27: qty 10

## 2024-06-27 MED ORDER — SODIUM CHLORIDE 0.9% FLUSH: 3.0000 mL | INTRAVENOUS | Status: DC | PRN

## 2024-06-27 MED ORDER — PHENYLEPHRINE HCL-NACL 20-0.9 MG/250ML-% IV SOLN
INTRAVENOUS | Status: AC
Start: 2024-06-27 — End: 2024-06-27
  Filled 2024-06-27: qty 250

## 2024-06-27 MED ORDER — CEFAZOLIN SODIUM-DEXTROSE 2-4 GM/100ML-% IV SOLN
2.0000 g | Freq: Four times a day (QID) | INTRAVENOUS | Status: AC
  Administered 2024-06-27 – 2024-06-28 (×2): 2 g via INTRAVENOUS
  Filled 2024-06-27 (×2): qty 100

## 2024-06-27 MED ORDER — KETAMINE HCL 50 MG/5ML IJ SOSY
PREFILLED_SYRINGE | INTRAMUSCULAR | Status: DC | PRN
  Administered 2024-06-27: 10 mg via INTRAVENOUS
  Administered 2024-06-27: 40 mg via INTRAVENOUS

## 2024-06-27 MED ORDER — ATORVASTATIN CALCIUM 10 MG PO TABS
10.0000 mg | ORAL_TABLET | Freq: Every day | ORAL | Status: DC
  Administered 2024-06-27 – 2024-07-03 (×7): 10 mg via ORAL
  Filled 2024-06-27 (×7): qty 1

## 2024-06-27 MED ORDER — HYDROMORPHONE HCL 1 MG/ML IJ SOLN: 0.5000 mg | INTRAMUSCULAR | Status: DC | PRN

## 2024-06-27 MED ORDER — KETOROLAC TROMETHAMINE 30 MG/ML IJ SOLN
INTRAMUSCULAR | Status: DC | PRN
  Administered 2024-06-27: 30 mg via INTRAVENOUS

## 2024-06-27 MED ORDER — OXYCODONE HCL 5 MG PO TABS: 5.0000 mg | ORAL_TABLET | ORAL | Status: DC | PRN

## 2024-06-27 MED ORDER — ONDANSETRON HCL 4 MG PO TABS: 4.0000 mg | ORAL_TABLET | Freq: Four times a day (QID) | ORAL | Status: DC | PRN

## 2024-06-27 MED ORDER — NEBIVOLOL HCL 10 MG PO TABS
10.0000 mg | ORAL_TABLET | Freq: Every day | ORAL | Status: DC
  Administered 2024-06-28 – 2024-06-29 (×2): 10 mg via ORAL
  Filled 2024-06-27 (×3): qty 1

## 2024-06-27 MED ORDER — ACETAMINOPHEN 500 MG PO TABS
1000.0000 mg | ORAL_TABLET | Freq: Four times a day (QID) | ORAL | Status: DC
  Administered 2024-06-28 – 2024-07-03 (×20): 1000 mg via ORAL
  Filled 2024-06-27 (×22): qty 2

## 2024-06-27 MED ORDER — HYDROMORPHONE HCL 1 MG/ML IJ SOLN
0.5000 mg | INTRAMUSCULAR | Status: DC | PRN
  Administered 2024-06-27 – 2024-06-28 (×2): 0.5 mg via INTRAVENOUS
  Filled 2024-06-27 (×2): qty 1

## 2024-06-27 MED ORDER — PROPOFOL 10 MG/ML IV BOLUS
INTRAVENOUS | Status: DC | PRN
  Administered 2024-06-27: 200 mg via INTRAVENOUS

## 2024-06-27 MED ORDER — HYDROMORPHONE HCL 1 MG/ML IJ SOLN
INTRAMUSCULAR | Status: AC
  Filled 2024-06-27: qty 1

## 2024-06-27 MED ORDER — ONDANSETRON HCL 4 MG/2ML IJ SOLN
INTRAMUSCULAR | Status: AC
  Filled 2024-06-27: qty 2

## 2024-06-27 MED ORDER — MIDAZOLAM HCL 2 MG/2ML IJ SOLN
INTRAMUSCULAR | Status: AC
  Filled 2024-06-27: qty 2

## 2024-06-27 MED ORDER — FENTANYL CITRATE (PF) 100 MCG/2ML IJ SOLN
INTRAMUSCULAR | Status: DC | PRN
  Administered 2024-06-27 (×2): 50 ug via INTRAVENOUS

## 2024-06-27 MED ORDER — FENTANYL CITRATE (PF) 100 MCG/2ML IJ SOLN
INTRAMUSCULAR | Status: AC
  Filled 2024-06-27: qty 2

## 2024-06-27 MED ORDER — ENOXAPARIN SODIUM 40 MG/0.4ML IJ SOSY: 40.0000 mg | PREFILLED_SYRINGE | INTRAMUSCULAR | Status: DC

## 2024-06-27 MED ORDER — EPHEDRINE 5 MG/ML INJ
INTRAVENOUS | Status: AC
  Filled 2024-06-27: qty 5

## 2024-06-27 MED ORDER — ACETAZOLAMIDE 250 MG PO TABS
125.0000 mg | ORAL_TABLET | Freq: Two times a day (BID) | ORAL | Status: DC
  Administered 2024-06-28 – 2024-07-01 (×7): 125 mg via ORAL
  Filled 2024-06-27 (×7): qty 1

## 2024-06-27 MED ORDER — SODIUM CHLORIDE 0.9 % IV SOLN: INTRAVENOUS | Status: AC

## 2024-06-27 MED ORDER — SODIUM CHLORIDE 0.9% FLUSH
3.0000 mL | Freq: Two times a day (BID) | INTRAVENOUS | Status: DC
  Administered 2024-06-27 – 2024-07-03 (×12): 3 mL via INTRAVENOUS

## 2024-06-27 MED ORDER — LIDOCAINE HCL (PF) 2 % IJ SOLN
INTRAMUSCULAR | Status: AC
  Filled 2024-06-27: qty 5

## 2024-06-27 MED ORDER — CEFAZOLIN SODIUM-DEXTROSE 2-4 GM/100ML-% IV SOLN
INTRAVENOUS | Status: AC
  Filled 2024-06-27: qty 100

## 2024-06-27 MED ORDER — ACETAMINOPHEN 10 MG/ML IV SOLN
INTRAVENOUS | Status: AC
  Filled 2024-06-27: qty 100

## 2024-06-27 MED ORDER — ROCURONIUM BROMIDE 10 MG/ML (PF) SYRINGE
PREFILLED_SYRINGE | INTRAVENOUS | Status: AC
  Filled 2024-06-27: qty 10

## 2024-06-27 MED ORDER — OXYCODONE HCL 5 MG PO TABS
5.0000 mg | ORAL_TABLET | Freq: Once | ORAL | Status: AC | PRN
  Administered 2024-06-27: 5 mg via ORAL

## 2024-06-27 MED ORDER — OXYCODONE HCL 5 MG PO TABS
ORAL_TABLET | ORAL | Status: AC
  Filled 2024-06-27: qty 1

## 2024-06-27 MED ORDER — FIBRIN SEALANT 2 ML SINGLE DOSE KIT
PACK | CUTANEOUS | Status: DC | PRN
  Administered 2024-06-27: 2 mL via TOPICAL

## 2024-06-27 MED ORDER — DEXAMETHASONE SOD PHOSPHATE PF 10 MG/ML IJ SOLN
INTRAMUSCULAR | Status: DC | PRN
  Administered 2024-06-27: 5 mg via INTRAVENOUS

## 2024-06-27 MED ORDER — SUCCINYLCHOLINE CHLORIDE 200 MG/10ML IV SOSY
PREFILLED_SYRINGE | INTRAVENOUS | Status: DC | PRN
  Administered 2024-06-27: 80 mg via INTRAVENOUS

## 2024-06-27 MED ORDER — POLYETHYLENE GLYCOL 3350 17 GM/SCOOP PO POWD
17.0000 g | Freq: Every day | ORAL | Status: DC
  Administered 2024-06-29: 17 g via ORAL
  Filled 2024-06-27: qty 119

## 2024-06-27 MED ORDER — MENTHOL 3 MG MT LOZG: 1.0000 | LOZENGE | OROMUCOSAL | Status: DC | PRN

## 2024-06-27 MED ORDER — CHLORHEXIDINE GLUCONATE 0.12 % MT SOLN
OROMUCOSAL | Status: AC
  Filled 2024-06-27: qty 15

## 2024-06-27 MED ORDER — KETAMINE HCL 50 MG/5ML IJ SOSY
PREFILLED_SYRINGE | INTRAMUSCULAR | Status: AC
  Filled 2024-06-27: qty 5

## 2024-06-27 MED ORDER — 0.9 % SODIUM CHLORIDE (POUR BTL) OPTIME
TOPICAL | Status: DC | PRN
  Administered 2024-06-27: 500 mL

## 2024-06-27 MED ORDER — PROPOFOL 500 MG/50ML IV EMUL
INTRAVENOUS | Status: DC | PRN
  Administered 2024-06-27: 30 ug/kg/min via INTRAVENOUS

## 2024-06-27 MED ORDER — GABAPENTIN 300 MG PO CAPS
300.0000 mg | ORAL_CAPSULE | Freq: Three times a day (TID) | ORAL | Status: DC
  Administered 2024-06-27 – 2024-07-03 (×16): 300 mg via ORAL
  Filled 2024-06-27 (×16): qty 1

## 2024-06-27 MED ORDER — ONDANSETRON HCL 4 MG/2ML IJ SOLN
INTRAMUSCULAR | Status: DC | PRN
  Administered 2024-06-27: 4 mg via INTRAVENOUS

## 2024-06-27 MED ORDER — KETOROLAC TROMETHAMINE 30 MG/ML IJ SOLN
INTRAMUSCULAR | Status: AC
  Filled 2024-06-27: qty 1

## 2024-06-27 MED ORDER — EPHEDRINE SULFATE-NACL 50-0.9 MG/10ML-% IV SOSY
PREFILLED_SYRINGE | INTRAVENOUS | Status: DC | PRN
  Administered 2024-06-27: 10 mg via INTRAVENOUS
  Administered 2024-06-27: 5 mg via INTRAVENOUS

## 2024-06-27 MED ORDER — HYDROMORPHONE HCL 1 MG/ML IJ SOLN
0.2500 mg | INTRAMUSCULAR | Status: DC | PRN
  Administered 2024-06-27 (×4): 0.5 mg via INTRAVENOUS

## 2024-06-27 MED ORDER — LIDOCAINE HCL (CARDIAC) PF 100 MG/5ML IV SOSY
PREFILLED_SYRINGE | INTRAVENOUS | Status: DC | PRN
  Administered 2024-06-27: 80 mg via INTRAVENOUS

## 2024-06-27 MED ORDER — GLYCOPYRROLATE 0.2 MG/ML IJ SOLN
INTRAMUSCULAR | Status: DC | PRN
  Administered 2024-06-27: .1 mg via INTRAVENOUS

## 2024-06-27 MED ORDER — GLYCOPYRROLATE 0.2 MG/ML IJ SOLN
INTRAMUSCULAR | Status: AC
  Filled 2024-06-27: qty 1

## 2024-06-27 MED ORDER — FIBRIN SEALANT 2 ML SINGLE DOSE KIT
PACK | CUTANEOUS | Status: AC
Start: 2024-06-27 — End: 2024-06-27
  Filled 2024-06-27: qty 2

## 2024-06-27 MED ORDER — ACETAMINOPHEN 10 MG/ML IV SOLN
INTRAVENOUS | Status: DC | PRN
  Administered 2024-06-27: 1000 mg via INTRAVENOUS

## 2024-06-27 MED ORDER — SENNA 8.6 MG PO TABS
1.0000 | ORAL_TABLET | Freq: Every day | ORAL | Status: DC
  Administered 2024-06-27 – 2024-06-28 (×2): 8.6 mg via ORAL
  Filled 2024-06-27 (×5): qty 1

## 2024-06-27 MED ORDER — OXYCODONE HCL 5 MG/5ML PO SOLN: 5.0000 mg | Freq: Once | ORAL | Status: AC | PRN

## 2024-06-27 MED ORDER — OXYCODONE HCL 5 MG PO TABS
5.0000 mg | ORAL_TABLET | Freq: Once | ORAL | Status: AC
  Administered 2024-06-27: 5 mg via ORAL

## 2024-06-27 MED ORDER — MIDAZOLAM HCL (PF) 2 MG/2ML IJ SOLN
INTRAMUSCULAR | Status: DC | PRN
  Administered 2024-06-27: 2 mg via INTRAVENOUS

## 2024-06-27 MED ORDER — ESCITALOPRAM OXALATE 10 MG PO TABS
20.0000 mg | ORAL_TABLET | Freq: Every day | ORAL | Status: DC
  Administered 2024-06-28 – 2024-07-03 (×6): 20 mg via ORAL
  Filled 2024-06-27: qty 1
  Filled 2024-06-27: qty 2
  Filled 2024-06-27 (×2): qty 1
  Filled 2024-06-27: qty 2
  Filled 2024-06-27: qty 1

## 2024-06-27 MED ORDER — OXYCODONE HCL 5 MG PO TABS
10.0000 mg | ORAL_TABLET | ORAL | Status: DC | PRN
  Administered 2024-06-28 – 2024-07-03 (×21): 10 mg via ORAL
  Filled 2024-06-27 (×21): qty 2

## 2024-06-27 MED ORDER — HYDROMORPHONE HCL 1 MG/ML IJ SOLN
INTRAMUSCULAR | Status: DC | PRN
  Administered 2024-06-27 (×2): .5 mg via INTRAVENOUS

## 2024-06-27 MED ORDER — OXYCODONE HCL 5 MG PO TABS
5.0000 mg | ORAL_TABLET | ORAL | Status: DC | PRN
  Administered 2024-06-27 – 2024-07-02 (×9): 5 mg via ORAL
  Filled 2024-06-27 (×9): qty 1

## 2024-06-27 MED ORDER — PROPOFOL 10 MG/ML IV BOLUS
INTRAVENOUS | Status: AC
Start: 2024-06-27 — End: 2024-06-27
  Filled 2024-06-27: qty 20

## 2024-06-27 MED ORDER — ORAL CARE MOUTH RINSE: 15.0000 mL | OROMUCOSAL | Status: DC | PRN

## 2024-06-27 MED ORDER — DEXMEDETOMIDINE HCL IN NACL 80 MCG/20ML IV SOLN
INTRAVENOUS | Status: DC | PRN
  Administered 2024-06-27: 8 ug via INTRAVENOUS
  Administered 2024-06-27: 4 ug via INTRAVENOUS
  Administered 2024-06-27 (×3): 8 ug via INTRAVENOUS

## 2024-06-27 MED ORDER — CHLORHEXIDINE GLUCONATE CLOTH 2 % EX PADS
6.0000 | MEDICATED_PAD | Freq: Every day | CUTANEOUS | Status: DC
  Administered 2024-06-27 – 2024-07-02 (×6): 6 via TOPICAL

## 2024-06-27 MED ORDER — SEVOFLURANE IN SOLN
RESPIRATORY_TRACT | Status: AC
  Filled 2024-06-27: qty 250

## 2024-06-27 MED ORDER — ONDANSETRON HCL 4 MG/2ML IJ SOLN: 4.0000 mg | Freq: Four times a day (QID) | INTRAMUSCULAR | Status: DC | PRN

## 2024-06-27 MED ORDER — ROCURONIUM BROMIDE 100 MG/10ML IV SOLN
INTRAVENOUS | Status: DC | PRN
  Administered 2024-06-27: 10 mg via INTRAVENOUS
  Administered 2024-06-27: 60 mg via INTRAVENOUS

## 2024-06-27 MED ORDER — SURGIFLO WITH THROMBIN (HEMOSTATIC MATRIX KIT) OPTIME
TOPICAL | Status: DC | PRN
  Administered 2024-06-27: 1 via TOPICAL

## 2024-06-27 MED ORDER — TIZANIDINE HCL 2 MG PO TABS
2.0000 mg | ORAL_TABLET | Freq: Three times a day (TID) | ORAL | Status: DC | PRN
  Administered 2024-06-28 (×2): 4 mg via ORAL
  Administered 2024-06-28: 2 mg via ORAL
  Administered 2024-06-29: 4 mg via ORAL
  Filled 2024-06-27: qty 2
  Filled 2024-06-27: qty 1
  Filled 2024-06-27 (×3): qty 2

## 2024-06-27 MED ORDER — ENOXAPARIN SODIUM 40 MG/0.4ML IJ SOSY
40.0000 mg | PREFILLED_SYRINGE | INTRAMUSCULAR | Status: DC
  Administered 2024-06-28 – 2024-07-03 (×6): 40 mg via SUBCUTANEOUS
  Filled 2024-06-27 (×6): qty 0.4

## 2024-06-27 MED ORDER — SODIUM CHLORIDE 0.9 % IV SOLN: 250.0000 mL | INTRAVENOUS | Status: AC

## 2024-06-27 SURGICAL SUPPLY — 48 items
BASIN KIT SINGLE STR (MISCELLANEOUS) ×2 IMPLANT
BLADE BOVIE TIP EXT 4 (BLADE) IMPLANT
BRUSH SCRUB EZ 4% CHG (MISCELLANEOUS) ×2 IMPLANT
BUR NEURO DRILL SOFT 3.0X3.8M (BURR) ×2 IMPLANT
CATH LUMBAR HERMETIC 14G (CATHETERS) IMPLANT
DERMABOND ADVANCED .7 DNX12 (GAUZE/BANDAGES/DRESSINGS) ×2 IMPLANT
DRAPE C ARM PK CFD 31 SPINE (DRAPES) ×2 IMPLANT
DRAPE C-ARM XRAY 36X54 (DRAPES) ×4 IMPLANT
DRAPE LAPAROTOMY 100X77 ABD (DRAPES) ×2 IMPLANT
DRAPE SPINE LEICA/WILD 54X150 (DRAPES) ×2 IMPLANT
DRSG OPSITE POSTOP 3X4 (GAUZE/BANDAGES/DRESSINGS) ×2 IMPLANT
DRSG OPSITE POSTOP 4X8 (GAUZE/BANDAGES/DRESSINGS) IMPLANT
DRSG TEGADERM 4X4.75 (GAUZE/BANDAGES/DRESSINGS) IMPLANT
DRSG TELFA 3X8 NADH STRL (GAUZE/BANDAGES/DRESSINGS) ×2 IMPLANT
ELECTRODE EZSTD 165MM 6.5IN (MISCELLANEOUS) ×2 IMPLANT
ELECTRODE REM PT RTRN 9FT ADLT (ELECTROSURGICAL) ×2 IMPLANT
EVACUATOR 1/8 PVC DRAIN (DRAIN) ×2 IMPLANT
GLOVE BIOGEL PI IND STRL 7.0 (GLOVE) ×2 IMPLANT
GLOVE BIOGEL PI IND STRL 8 (GLOVE) ×4 IMPLANT
GLOVE SURG SYN 7.0 PF PI (GLOVE) ×2 IMPLANT
GLOVE SURG SYN 7.5 PF PI (GLOVE) ×4 IMPLANT
GOWN SRG XL LVL 3 NONREINFORCE (GOWNS) ×2 IMPLANT
GOWN STRL REUS W/ TWL LRG LVL3 (GOWN DISPOSABLE) ×2 IMPLANT
GOWN STRL REUS W/ TWL XL LVL3 (GOWN DISPOSABLE) ×4 IMPLANT
GRAFT DURAGEN MATRIX 1WX1L (Tissue) IMPLANT
KIT DRAIN CSF ACCUDRAIN (MISCELLANEOUS) IMPLANT
KIT TURNOVER KIT A (KITS) ×2 IMPLANT
KIT WILSON FRAME (KITS) ×2 IMPLANT
MANIFOLD NEPTUNE II (INSTRUMENTS) ×2 IMPLANT
NDL SAFETY ECLIP 18X1.5 (MISCELLANEOUS) ×2 IMPLANT
NS IRRIG 500ML POUR BTL (IV SOLUTION) ×2 IMPLANT
PACK LAMINECTOMY ARMC (PACKS) ×2 IMPLANT
PAD ARMBOARD POSITIONER FOAM (MISCELLANEOUS) ×4 IMPLANT
SOLN 0.9% NACL POUR BTL 1000ML (IV SOLUTION) ×2 IMPLANT
SOLUTION IRRIG SURGIPHOR (IV SOLUTION) ×2 IMPLANT
SPONGE DRAIN TRACH 4X4 STRL 2S (GAUZE/BANDAGES/DRESSINGS) IMPLANT
STAPLER SKIN PROX 35W (STAPLE) ×2 IMPLANT
SURGIFLO W/THROMBIN 8M KIT (HEMOSTASIS) ×2 IMPLANT
SUT ETHILON 3-0 (SUTURE) ×2 IMPLANT
SUT GORETEX 6.0 TT9 (SUTURE) IMPLANT
SUT MNCRL AB 4-0 PS2 18 (SUTURE) IMPLANT
SUT NURALON 4 0 TR CR/8 (SUTURE) IMPLANT
SUT SILK 2-0 30XBRD TIE 12 (SUTURE) IMPLANT
SUT VIC AB 0 CT1 27XCR 8 STRN (SUTURE) ×2 IMPLANT
SUT VIC AB 2-0 CT1 18 (SUTURE) ×2 IMPLANT
SYR 30ML LL (SYRINGE) ×4 IMPLANT
TIP FAN IRRIG PULSAVAC PLUS (DISPOSABLE) IMPLANT
TRAP FLUID SMOKE EVACUATOR (MISCELLANEOUS) ×2 IMPLANT

## 2024-06-27 NOTE — Telephone Encounter (Signed)
 Received phonecall from [place], RN, from answering service regarding pain medication prior to surgery. Patient called answering service to ask what pain medication she could take prior to surgery today. Informed RN that patient can take tylenol , oxycodone , tizanidine , and gabapentin . Advised to hold any NSAIDs. On call RN to inform patient.

## 2024-06-27 NOTE — Interval H&P Note (Signed)
 History and Physical Interval Note:  06/27/2024 3:01 PM  Mary Sanford  has presented today for surgery, with the diagnosis of Recurrent CSF leak.  The various methods of treatment have been discussed with the patient and family. After consideration of risks, benefits and other options for treatment, the patient has consented to  Procedure(s): Reexploration and extension of laminectomy for recurrent pseudomeningocele/CSF leak (N/A) Placement of intraoperative lumbar drain (N/A) as a surgical intervention.  The patient's history has been reviewed, patient examined, no change in status, stable for surgery.  I have reviewed the patient's chart and labs.  Questions were answered to the patient's satisfaction.    Heart sounds normal no MRG. Chest Clear to Auscultation Bilaterally.   Ace Bergfeld

## 2024-06-27 NOTE — Transfer of Care (Signed)
 Immediate Anesthesia Transfer of Care Note  Patient: Mary Sanford  Procedure(s) Performed: Reexploration and extension of laminectomy for recurrent pseudomeningocele/CSF leak (Spine Lumbar) Placement of intraoperative lumbar drain (Back)  Patient Location: PACU  Anesthesia Type:General  Level of Consciousness: drowsy and patient cooperative  Airway & Oxygen Therapy: Patient Spontanous Breathing and Patient connected to nasal cannula oxygen  Post-op Assessment: Report given to RN and Post -op Vital signs reviewed and stable  Post vital signs: Reviewed and stable  Last Vitals:  Vitals Value Taken Time  BP 133/80 06/27/24 18:20  Temp 36.4 C 06/27/24 18:17  Pulse 75 06/27/24 18:22  Resp 15 06/27/24 18:22  SpO2 100 % 06/27/24 18:22  Vitals shown include unfiled device data.  Last Pain:  Vitals:   06/27/24 1820  TempSrc:   PainSc: 10-Worst pain ever         Complications: No notable events documented.

## 2024-06-27 NOTE — Anesthesia Preprocedure Evaluation (Signed)
 Anesthesia Evaluation  Patient identified by MRN, date of birth, ID band Patient awake    Reviewed: Allergy & Precautions, H&P , NPO status , Patient's Chart, lab work & pertinent test results, reviewed documented beta blocker date and time   History of Anesthesia Complications Negative for: history of anesthetic complications  Airway Mallampati: I  TM Distance: >3 FB Neck ROM: full    Dental  (+) Dental Advidsory Given, Missing, Teeth Intact   Pulmonary neg pulmonary ROS, former smoker   Pulmonary exam normal breath sounds clear to auscultation       Cardiovascular Exercise Tolerance: Good hypertension, (-) angina (-) Past MI and (-) Cardiac Stents Normal cardiovascular exam(-) dysrhythmias (-) Valvular Problems/Murmurs Rhythm:regular Rate:Normal     Neuro/Psych  PSYCHIATRIC DISORDERS Anxiety Depression    negative neurological ROS     GI/Hepatic negative GI ROS, Neg liver ROS,,,  Endo/Other  diabetes (borderline)    Renal/GU Renal disease (kidney stones)  negative genitourinary   Musculoskeletal   Abdominal   Peds  Hematology negative hematology ROS (+)   Anesthesia Other Findings Past Medical History: No date: Allergy No date: Arthritis     Comment:  hands, left knee No date: Chronic low back pain No date: Depression No date: Dyslipidemia No date: Family history of adverse reaction to anesthesia     Comment:  one sister ponv and slow to awaken No date: GAD (generalized anxiety disorder) No date: History of kidney stones No date: Hyperaldosteronism (HCC)     Comment:  followed by pcp   (abd MRI in epic 10-13-2023 normal) No date: Iron deficiency anemia No date: Malignant hypertension     Comment:  ED visit in epic 09-22-2023 HTN urgency, bp 165/101;                 previously sent to GYN office 09-15-2023 for bp 193/ 115 No date: Panic disorder No date: PMB (postmenopausal bleeding) No date:  Pre-diabetes     Comment:  followed by pcp 06/22/2018: S/P gastric bypass     Comment:  bariatric surgeon ---- dr forbes. tanda No date: Seasonal allergies No date: SUI (stress urinary incontinence, female) No date: Uterine fibroid No date: Wears contact lenses No date: Wears glasses   Reproductive/Obstetrics negative OB ROS                              Anesthesia Physical Anesthesia Plan  ASA: 2  Anesthesia Plan: General   Post-op Pain Management:    Induction: Intravenous  PONV Risk Score and Plan: 3 and Ondansetron , Dexamethasone , Treatment may vary due to age or medical condition and Midazolam   Airway Management Planned: Oral ETT  Additional Equipment:   Intra-op Plan:   Post-operative Plan: Extubation in OR  Informed Consent: I have reviewed the patients History and Physical, chart, labs and discussed the procedure including the risks, benefits and alternatives for the proposed anesthesia with the patient or authorized representative who has indicated his/her understanding and acceptance.     Dental Advisory Given  Plan Discussed with: Anesthesiologist, CRNA and Surgeon  Anesthesia Plan Comments: (Patient consented for risks of anesthesia including but not limited to:  - adverse reactions to medications - damage to eyes, teeth, lips or other oral mucosa - nerve damage due to positioning  - sore throat or hoarseness - Damage to heart, brain, nerves, lungs, other parts of body or loss of life  Patient voiced understanding and assent.)  Anesthesia Quick Evaluation

## 2024-06-27 NOTE — H&P (Signed)
 Primary Physician:  Joshua Debby CROME, MD  Chief Complaint: CSF leak  History of Present Illness: 06/27/2024 Mary Sanford is a 58 y.o. female who presents with the chief complaint of CSF leak status post lumbar laminectomy.  She had a previous attempt at a repair which appeared to be doing well for approximately 7 to 10 days but then had recurrent symptoms.  Unfortunately was found to have another recurrence of her CSF leak.    Given the ongoing pressure headaches and symptoms consistent with fluid shift from CSF leak she was planned for a lumbar laminectomy for CSF leak repair. The symptoms are causing a significant impact on the patient's life.   Review of Systems:  A 10 point review of systems is negative, except for the pertinent positives and negatives detailed in the HPI.  Past Medical History: Past Medical History:  Diagnosis Date   Allergy    Arthritis    hands, left knee   Chronic low back pain    Depression    Dyslipidemia    Family history of adverse reaction to anesthesia    one sister ponv and slow to awaken   GAD (generalized anxiety disorder)    History of kidney stones    Hyperaldosteronism    followed by pcp   (abd MRI in epic 10-13-2023 normal)   Iron deficiency anemia    Malignant hypertension    ED visit in epic 09-22-2023 HTN urgency, bp 165/101;   previously sent to GYN office 09-15-2023 for bp 193/ 115   Panic disorder    PMB (postmenopausal bleeding)    Postoperative CSF leak    Pre-diabetes    followed by pcp   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 tanda   S/P lumbar laminectomy    Seasonal allergies    SUI (stress urinary incontinence, female)    Uterine fibroid    Wears contact lenses    Wears glasses     Past Surgical History: Past Surgical History:  Procedure Laterality Date   ABDOMINAL HYSTERECTOMY     BUNIONECTOMY Bilateral    CESAREAN SECTION  1989   COLONOSCOPY WITH PROPOFOL   11/05/2020   dr glo    CYSTOSCOPY N/A 11/03/2023   Procedure: CYSTOSCOPY;  Surgeon: Jeralyn Crutch, MD;  Location: [facility];  Service: Gynecology;  Laterality: N/A;   CYSTOSCOPY W/ RETROGRADES Left 06/28/2018   Procedure: CYSTOSCOPY WITH RETROGRADE PYELOGRAM LEFT STENT;  Surgeon: Devere Lonni Righter, MD;  Location: [facility];  Service: Urology;  Laterality: Left;   cystoscopy with stents and removal of kidney stones     x 3 times   CYSTOSCOPY/URETEROSCOPY/HOLMIUM LASER/STENT PLACEMENT Left 07/16/2018   Procedure: CYSTOSCOPY/URETEROSCOPY/HOLMIUM LASER/STENT EXCHANGE;  Surgeon: Devere Lonni Righter, MD;  Location: [facility];  Service: Urology;  Laterality: Left;   DILATATION & CURETTAGE/HYSTEROSCOPY WITH MYOSURE N/A 12/13/2019   Procedure: DILATATION & CURETTAGE/HYSTEROSCOPY WITH MYOSURE;  Surgeon: Jannis Kate Norris, MD;  Location: [facility] [place];  Service: Gynecology;  Laterality: N/A;   DILITATION & CURRETTAGE/HYSTROSCOPY WITH NOVASURE ABLATION N/A 06/17/2013   Procedure: DILATATION & CURETTAGE/HYSTEROSCOPY WITH NOVASURE ABLATION;  Surgeon: Gloris DELENA Hugger, MD;  Location: [facility];  Service: Gynecology;  Laterality: N/A;   ESOPHAGOGASTRODUODENOSCOPY (EGD) WITH PROPOFOL   10/11/2020   dr glo   FINGER SURGERY  04/2021   thumb   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   Procedure: LAPAROSCOPIC ROUX-EN-Y GASTRIC BYPASS WITH UPPER ENDOSCOPY;  Surgeon: Tanda Locus, MD;  Location: [facility];  Service: General;  Laterality: N/A;   HEMORRHOIDECTOMY WITH HEMORRHOID BANDING  10/19/2012   SCA THD Hem ligation/pexy   LAMINECTOMY FOR CEREBROSPINAL FLUID LEAK N/A 06/02/2024   Procedure: Laminectomy for postoperative CSF leak;  Surgeon: Claudene Penne ORN, MD;  Location: [facility];  Service: Neurosurgery;  Laterality: N/A;   LAPAROSCOPIC TUBAL LIGATION Bilateral 2001   LUMBAR LAMINECTOMY/DECOMPRESSION MICRODISCECTOMY N/A 04/22/2024   Procedure: L4-S1 Lumbar Laminectomy and Decompression,  Midline Approach;  Surgeon: Claudene Penne ORN, MD;  Location: [facility];  Service: Neurosurgery;  Laterality: N/A;   OPERATIVE ULTRASOUND N/A 12/13/2019   Procedure: OPERATIVE ULTRASOUND;  Surgeon: Jannis Kate Norris, MD;  Location[facility];  Service: Gynecology;  Laterality: N/A;   RADIOLOGY WITH ANESTHESIA N/A 05/30/2024   Procedure: MRI WITH ANESTHESIA;  Surgeon: Radiologist, Medication, MD;  Location: [facility];  Service: Radiology;  Laterality: N/A;   ROBOTIC ASSISTED LAPAROSCOPIC HYSTERECTOMY AND SALPINGECTOMY Bilateral 11/03/2023   Procedure: XI ROBOTIC ASSISTED LAPAROSCOPIC HYSTERECTOMY AND SALPINGO-OOPHERECTOMY;  Surgeon: Jeralyn Crutch, MD;  Location: [facility];  Service: Gynecology;  Laterality: Bilateral;   SMALL INTESTINE SURGERY  06/22/2018   gastric bypass   TUBAL LIGATION     WISDOM TOOTH EXTRACTION      Allergies: Allergies as of 06/23/2024 - Review Complete 06/23/2024  Allergen Reaction Noted   Naproxen  Other (See Comments) 12/01/2012    Medications:  Current Facility-Administered Medications:    ceFAZolin  (ANCEF ) IVPB 2g/100 mL premix, 2 g, Intravenous, 60 min Pre-Op, Claudene, Penne ORN, MD   lactated ringers  infusion, , Intravenous, Continuous, Vicci Camellia Glatter, MD   Social History: Social History   Tobacco Use   Smoking status: Former    Types: Cigarettes   Smokeless tobacco: Never   Tobacco comments:    10-27-2023  pt stated quit in 2000/  started age 78 (64)  for 16 yrs  Vaping Use   Vaping status: Never Used  Substance Use Topics   Alcohol use: Yes    Alcohol/week: 1.0 - 2.0 standard drink of alcohol    Types: 1 - 2 Glasses of wine per week    Comment: per pt could 1-2 bottles per week   Drug use: Not Currently    Types: Marijuana    Comment: last used 1990s    Family Medical History: Family History  Problem Relation Age of Onset   Hypertension Mother    Hypothyroidism Mother    Rectal cancer Mother 55   Colon cancer  Mother 48   Colon polyps Mother    Asthma Mother    Cancer Mother    Hypotension Sister    Hypothyroidism Sister    Diabetes Sister    Other Sister        pacemaker   Hypothyroidism Sister    Seizures Sister    Anemia Sister    Deep vein thrombosis Sister    Diabetes Daughter    Stroke Maternal Uncle        > 55   Hypertension Maternal Uncle    Diabetes Maternal Grandmother    Kidney failure Maternal Grandmother    Heart attack Maternal Grandfather    Other Paternal Grandmother        tumors throughout body   Seizures Paternal Grandfather    Seizures Brother    Esophageal cancer Neg Hx    Stomach cancer Neg Hx    Heart disease Neg Hx    Breast cancer Neg Hx     Physical Examination: Vitals:   06/27/24 1341  BP: (!) 126/106  Pulse: 82  Resp: 17  Temp: 97.9 F (36.6 C)  SpO2: 100%     General: Patient is well developed, well nourished, calm, collected, and in no apparent distress.  NEUROLOGICAL:  Boggy fluid collection under her previous incision.  No evidence of active breakdown  Strength:  Side Iliopsoas Quads Hamstring PF DF EHL  R 5 5 5 5 5 5   L 5 5 5 5 5 5     Imaging: Narrative & Impression   Narrative & Impression  CLINICAL DATA:  Concern for CSF leak following spinal surgery   EXAM: MRI LUMBAR SPINE WITHOUT CONTRAST   TECHNIQUE: Multiplanar, multisequence MR imaging of the lumbar spine was performed. No intravenous contrast was administered.   COMPARISON:  05/30/2024   FINDINGS: Segmentation:  Standard.   Alignment:  Physiologic.   Vertebrae: No fracture, evidence of discitis, or bone lesion. Posterior decompression at L4-5 and L5-S1.   Conus medullaris and cauda equina: Conus extends to the L1-2 level. Conus and cauda equina appear normal.   Paraspinal and other soft tissues: Large fluid collection extending from the laminectomy bed of the lower lumbar spine and into the posterior subcutaneous soft tissues of the low back now  measuring up to 9.0 cm in craniocaudal dimension (previously 7.5 cm on 05/30/2024).   Disc levels:   Unchanged compared to recent prior MRI.   IMPRESSION: Large fluid collection extending from the laminectomy bed of the lower lumbar spine and into the posterior subcutaneous soft tissues of the low back now measuring up to 9.0 cm in craniocaudal dimension (previously 7.5 cm on 05/30/2024). Findings are again suspicious for a CSF leak.     Electronically Signed   By: Mabel Converse D.O.   On: 06/18/2024 11:14    I have personally reviewed the images and agree with the above interpretation.  Labs:    Latest Ref Rng & Units 06/19/2024    2:16 PM 06/04/2024    5:56 AM 05/30/2024    6:01 AM  CBC  WBC 4.0 - 10.5 K/uL 6.2  8.0  5.8   Hemoglobin 12.0 - 15.0 g/dL 87.8  88.0  86.9   Hematocrit 36.0 - 46.0 % 38.7  37.9  41.3   Platelets 150 - 400 K/uL 220  266  244       Latest Ref Rng & Units 06/19/2024    2:16 PM 05/30/2024    6:01 AM 05/29/2024    1:10 PM  BMP  Glucose 70 - 99 mg/dL 91  871  855   BUN 6 - 20 mg/dL 18  10  10    Creatinine 0.44 - 1.00 mg/dL 9.20  9.26  9.29   Sodium 135 - 145 mmol/L 141  140  139   Potassium 3.5 - 5.1 mmol/L 4.2  4.2  3.9   Chloride 98 - 111 mmol/L 108  107  103   CO2 22 - 32 mmol/L 25  23  24    Calcium  8.9 - 10.3 mg/dL 9.6  9.5  89.5         Assessment and Plan: Ms. Berni is a pleasant 58 y.o. female with history of a lumbar laminectomy approximately a month ago.  She presented with a delayed CSF leak.  Unfortunately after her last repair she presented with another delayed CSF leak.  She was started on Diamox  and then I reviewed her case with Dr. Clois who graciously agreed to help with a another attempt of a primary closure  with possible lumbar drain placement.  She continues to have a fluid collection, continues to have pain and pressure-like symptoms.  Will plan to go to the OR today for CSF leak repair.  We did discuss the risk  benefits of surgery.  She like to go forward with the procedure.   Risk and benefits were discussed.  She like to go forward with the procedure.  Penne MICAEL Sharps, MD/MSCR Dept. of Neurosurgery

## 2024-06-27 NOTE — Op Note (Addendum)
 Indications: Recurrent CSF leak  Findings: No clear drainage from previous site, spinal site reexplored laterally where previous scar tissue was noted without any drainage.  We did find an area of continued drainage and dural discontinuity on the right inferior aspect of the exposure  Preoperative Diagnosis: Recurrent CSF leak Postoperative Diagnosis: Recurrent CSF leak   EBL: 100 mL IVF: see anesthesia record Drains: Lumbar drain Disposition: Extubated and Stable to PACU Complications: none  No foley catheter was placed.  Procedure: Expansion of laminectomy for exploration of CSF leak.  Primary closure, placement of lumbar drain  Preoperative Note:  Risks of surgery discussed include: infection, bleeding, stroke, coma, death, paralysis, CSF leak, nerve/spinal cord injury, numbness, tingling, weakness, complex regional pain syndrome, recurrent stenosis and/or disc herniation, vascular injury, development of instability, neck/back pain, need for further surgery, persistent symptoms, development of deformity, and the risks of anesthesia. They understood these risks and have agreed to proceed.  Operative Note:    The patient was then brought from the preoperative center with intravenous access established.  The patient underwent general anesthesia and endotracheal tube intubation, then was rotated on the [facility] table where all pressure points were appropriately padded.  An incision was marked with flouroscopy. The skin was then thoroughly cleansed.  Perioperative antibiotic prophylaxis was administered.  Sterile prep and drapes were then applied and a timeout was then observed.     Previous incision was opened sharply.  Significant amount of CSF was noted and evacuated.  We then continued a midline exposure and once we were deep were able to palpate the bone and expose cranially and caudally.  We performed meticulous dissection and found the previous site of a CSF leak, we did not find any  active leakage at this site it appeared to be well opposed and without any significant drainage.  Because of this we were prompted to continue to explore the previous site of decompression for any further areas of CSF leak or dural discontinuity.  We did note significant right sided fluid egress after a area of/pseudo dura or connective tissue was elevated.  We continue to follow this caudally until we were able to identify a area of discontinuity of the dura at the inferior aspect of our exposure.  We removed further bone with a high-speed drill and curettes/rongeurs.  We are able to fully expose this area of leakage and closed it with 2 interrupted stitches with muscle grafts.  We did this under microscopic guidance.  Once we tested this with Valsalva and did not see any further leakage we placed a dural replacement over the lateral site on the left and dural sealant over top of this level as well as a blood patch  We continued to explore for any further areas of dural discontinuity but we did not come up on any.  We then performed a multilayer multilayer closure with both interrupted and running sutures.  Once we are able to close the fascia and did not notice any fluid drainage we placed a lumbar drain trans fascially and not in continuity with the previous exposure.  We did this under fluoroscopic guidance.  We obtained clear return of cerebrospinal fluid.  This was tunneled laterally.  It was connected to a drainage.  Trial and showed good drainage.  We continued to close in multiple layers superficial layer being nylon with interrupted mattress sutures.  I performed this entire procedure with [facility] residents assistant surgeon, given the complexity of the surgery I required an  assistant surgeon to help with the complex closure and multilevel fascial closure and drainage placement.  Penne MICAEL Sharps, MD

## 2024-06-27 NOTE — Anesthesia Procedure Notes (Signed)
 Procedure Name: Intubation Date/Time: 06/27/2024 3:10 PM  Performed by: Myra Lawless, CRNAPre-anesthesia Checklist: Patient identified, Patient being monitored, Timeout performed, Emergency Drugs available and Suction available Patient Re-evaluated:Patient Re-evaluated prior to induction Oxygen Delivery Method: Circle system utilized Preoxygenation: Pre-oxygenation with 100% oxygen Induction Type: IV induction Ventilation: Mask ventilation without difficulty Laryngoscope Size: Mac, 4 and McGrath Grade View: Grade I Tube type: Oral Tube size: 7.0 mm Number of attempts: 1 Airway Equipment and Method: Stylet Placement Confirmation: ETT inserted through vocal cords under direct vision, positive ETCO2 and breath sounds checked- equal and bilateral Secured at: 22 cm Tube secured with: Tape Dental Injury: Teeth and Oropharynx as per pre-operative assessment

## 2024-06-28 ENCOUNTER — Other Ambulatory Visit: Payer: Self-pay

## 2024-06-28 ENCOUNTER — Encounter: Payer: Self-pay | Admitting: Neurosurgery

## 2024-06-28 MED ORDER — HYDROMORPHONE HCL 1 MG/ML IJ SOLN
0.5000 mg | INTRAMUSCULAR | Status: AC | PRN
  Administered 2024-06-28 – 2024-06-29 (×5): 0.5 mg via INTRAVENOUS
  Filled 2024-06-28 (×6): qty 1

## 2024-06-28 NOTE — Progress Notes (Addendum)
   Neurosurgery Progress Note  History: Mary Sanford is s/p expansion laminectomy fort exploration and repair of CSF leak and placement of lumbar drain  POD1: Pt experiencing back pain and left leg pain this morning which is not new from pre-op   Physical Exam: Vitals:   06/28/24 0600 06/28/24 0700  BP: 128/77 131/79  Pulse: 60 (!) 56  Resp: 10 10  Temp:    SpO2: 100% 100%    AA Ox3 CNI  Strength:MAEW  Incision covered with clean and dry dressing   Data:  Other tests/results: see results review   Assessment/Plan:  Mary Sanford is a 58 y.o presenting with recurrent CSF leak s/p expansion laminectomy fort exploration and repair of CSF leak and placement of lumbar drain  - pt to remain on bed rest at this time. - continue frequent dressing checks  - continue lumbar drain at 10cc/hr - pain control - DVT prophylaxis  Edsel Goods PA-C Department of Neurosurgery

## 2024-06-28 NOTE — Plan of Care (Signed)
 This patient remains on [facility]-ICU/SDU as of time of writing. The patient is alert and oriented to person, place, and, but is forgetful with regards to the events of her hospitalization. No focal motor or sensory deficits are noted upon my assessment. The patient has a lumbar drain in place, manually draining 10 ml / hr of fluid (pink-tinged but clearing throughout the night). The patient has no supplemental O2 requirement at this time. The patient's spouse remains at bedside overnight.   Problem: Education: Goal: Knowledge of General Education information will improve Description: Including pain rating scale, medication(s)/side effects and non-pharmacologic comfort measures Outcome: Progressing   Problem: Health Behavior/Discharge Planning: Goal: Ability to manage health-related needs will improve Outcome: Progressing   Problem: Clinical Measurements: Goal: Ability to maintain clinical measurements within normal limits will improve Outcome: Progressing Goal: Will remain free from infection Outcome: Progressing Goal: Diagnostic test results will improve Outcome: Progressing Goal: Respiratory complications will improve Outcome: Progressing Goal: Cardiovascular complication will be avoided Outcome: Progressing   Problem: Activity: Goal: Risk for activity intolerance will decrease Outcome: Progressing   Problem: Nutrition: Goal: Adequate nutrition will be maintained Outcome: Progressing   Problem: Coping: Goal: Level of anxiety will decrease Outcome: Progressing   Problem: Elimination: Goal: Will not experience complications related to bowel motility Outcome: Progressing Goal: Will not experience complications related to urinary retention Outcome: Progressing   Problem: Pain Managment: Goal: General experience of comfort will improve and/or be controlled Outcome: Progressing   Problem: Safety: Goal: Ability to remain free from injury will improve Outcome: Progressing    Problem: Skin Integrity: Goal: Risk for impaired skin integrity will decrease Outcome: Progressing   Problem: Education: Goal: Ability to verbalize activity precautions or restrictions will improve Outcome: Progressing Goal: Knowledge of the prescribed therapeutic regimen will improve Outcome: Progressing Goal: Understanding of discharge needs will improve Outcome: Progressing   Problem: Activity: Goal: Ability to avoid complications of mobility impairment will improve Outcome: Progressing Goal: Ability to tolerate increased activity will improve Outcome: Progressing Goal: Will remain free from falls Outcome: Progressing   Problem: Bowel/Gastric: Goal: Gastrointestinal status for postoperative course will improve Outcome: Progressing   Problem: Clinical Measurements: Goal: Ability to maintain clinical measurements within normal limits will improve Outcome: Progressing Goal: Postoperative complications will be avoided or minimized Outcome: Progressing Goal: Diagnostic test results will improve Outcome: Progressing   Problem: Pain Management: Goal: Pain level will decrease Outcome: Progressing   Problem: Skin Integrity: Goal: Will show signs of wound healing Outcome: Progressing   Problem: Health Behavior/Discharge Planning: Goal: Identification of resources available to assist in meeting health care needs will improve Outcome: Progressing   Problem: Bladder/Genitourinary: Goal: Urinary functional status for postoperative course will improve Outcome: Progressing

## 2024-06-28 NOTE — Anesthesia Postprocedure Evaluation (Signed)
 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   Last Vitals:  Vitals:   06/28/24 0600 06/28/24 0700  BP: 128/77 131/79  Pulse: 60 (!) 56  Resp: 10 10  Temp:    SpO2: 100% 100%    Last Pain:  Vitals:   06/28/24 0739  TempSrc:   PainSc: 10-Worst pain ever                 Alfrieda LILLETTE Lan

## 2024-06-28 NOTE — Plan of Care (Signed)
  Problem: Education: Goal: Knowledge of General Education information will improve Description: Including pain rating scale, medication(s)/side effects and non-pharmacologic comfort measures Outcome: Progressing   Problem: Health Behavior/Discharge Planning: Goal: Ability to manage health-related needs will improve Outcome: Progressing   Problem: Clinical Measurements: Goal: Ability to maintain clinical measurements within normal limits will improve Outcome: Progressing Goal: Will remain free from infection Outcome: Progressing Goal: Diagnostic test results will improve Outcome: Progressing Goal: Respiratory complications will improve Outcome: Progressing Goal: Cardiovascular complication will be avoided Outcome: Progressing   Problem: Activity: Goal: Risk for activity intolerance will decrease Outcome: Not Progressing   Problem: Nutrition: Goal: Adequate nutrition will be maintained Outcome: Progressing   Problem: Coping: Goal: Level of anxiety will decrease Outcome: Progressing   Problem: Elimination: Goal: Will not experience complications related to bowel motility Outcome: Progressing Goal: Will not experience complications related to urinary retention Outcome: Not Progressing   Problem: Pain Managment: Goal: General experience of comfort will improve and/or be controlled Outcome: Not Progressing   Problem: Safety: Goal: Ability to remain free from injury will improve Outcome: Not Progressing   Problem: Skin Integrity: Goal: Risk for impaired skin integrity will decrease Outcome: Progressing   Problem: Education: Goal: Ability to verbalize activity precautions or restrictions will improve Outcome: Progressing Goal: Knowledge of the prescribed therapeutic regimen will improve Outcome: Progressing Goal: Understanding of discharge needs will improve Outcome: Progressing   Problem: Activity: Goal: Ability to avoid complications of mobility impairment will  improve Outcome: Not Progressing Goal: Ability to tolerate increased activity will improve Outcome: Not Progressing Goal: Will remain free from falls Outcome: Progressing   Problem: Bowel/Gastric: Goal: Gastrointestinal status for postoperative course will improve Outcome: Progressing   Problem: Clinical Measurements: Goal: Ability to maintain clinical measurements within normal limits will improve Outcome: Progressing Goal: Postoperative complications will be avoided or minimized Outcome: Progressing Goal: Diagnostic test results will improve Outcome: Progressing   Problem: Pain Management: Goal: Pain level will decrease Outcome: Not Progressing   Problem: Skin Integrity: Goal: Will show signs of wound healing Outcome: Progressing   Problem: Health Behavior/Discharge Planning: Goal: Identification of resources available to assist in meeting health care needs will improve Outcome: Progressing   Problem: Bladder/Genitourinary: Goal: Urinary functional status for postoperative course will improve Outcome: Progressing

## 2024-06-28 NOTE — TOC Initial Note (Signed)
 Transition of Care [facility]) - Initial/Assessment Note    Patient Details  Name: Mary Sanford MRN: [id number] Date of Birth: 09/13/1965  Transition of Care [facility]) CM/SW Contact:    Corrie JINNY Ruts, LCSW Phone Number: 06/28/2024, 3:21 PM  Clinical Narrative:                 Chart reviewed. I spoke with the patient a bedside today. I introduced myself, my role, and reason for consult. The patient reports doing well. The patient reports that the she has a PCP. The patient reports that she lives in the home with her husband, daughter, and grandson. The patient reports that she is able to complete task independently. The patient reports the she, her husband, and daughter takes her to medical appointment. The patient reports that her family will assist during D/C  The patient reports that she has never had HH or been admitted into a SNF in the past. The patient reports that she has a walker in the home. I inquired about medication assistance and the patient was accepting. I have sent the patient information to meds to bed. The patient does not qualify due to insurance.  There are no other TOC needs.     Barriers to Discharge: Continued Medical Work up   Patient Goals and CMS Choice            Expected Discharge Plan and Services                                              Prior Living Arrangements/Services   Lives with:: Adult Children, Spouse Patient language and need for interpreter reviewed:: Yes Do you feel safe going back to the place where you live?: Yes      Need for Family Participation in Patient Care: Yes (Comment) Care giver support system in place?: Yes (comment)   Criminal Activity/Legal Involvement Pertinent to Current Situation/Hospitalization: No - Comment as needed  Activities of Daily Living   ADL Screening (condition at time of admission) Independently performs ADLs?: Yes (appropriate for developmental age) Is the patient deaf or have difficulty  hearing?: No Does the patient have difficulty seeing, even when wearing glasses/contacts?: No Does the patient have difficulty concentrating, remembering, or making decisions?: No  Permission Sought/Granted                  Emotional Assessment Appearance:: Appears stated age Attitude/Demeanor/Rapport: Gracious Affect (typically observed): Calm Orientation: : Oriented to Place, Oriented to Self, Oriented to  Time Alcohol / Substance Use: Not Applicable Psych Involvement: No (comment)  Admission diagnosis:  Postoperative CSF leak [G97.82, G96.00] Status post lumbar laminectomy [Z98.890] CSF leak [G96.00] Patient Active Problem List   Diagnosis Date Noted   CSF leak 06/02/2024   Postoperative CSF leak 05/29/2024   Status post lumbar laminectomy 05/29/2024   Acute bilateral low back pain with bilateral sciatica 05/23/2024   Long-term current use of opiate analgesic 05/23/2024   Lumbar stenosis 04/22/2024   Spondylolisthesis at L5-S1 level 04/08/2024   Chronic bilateral low back pain without sciatica 04/03/2024   Spinal stenosis at L4-L5 level 03/03/2024   Hyperaldosteronism 06/18/2023   Dyslipidemia, goal LDL below 100 06/17/2023   Malignant hypertension 06/11/2023   Need for immunization against influenza 06/11/2023   Encounter for general adult medical examination with abnormal findings 11/07/2022   Primary osteoarthritis involving multiple  joints 12/26/2021   Pre-diabetes    Status post bariatric surgery 06/16/2019   Allergic rhinitis 01/12/2013   B12 deficiency anemia 01/09/2012   Essential hypertension, benign 11/24/2011   PCP:  Joshua Debby CROME, MD Pharmacy:   [facility] [address] ([place]), [place] - [address] [place] ([place]) [place] [address] Phone: [phone] Fax: [phone]  [facility] [address] [place] [address] Phone: [phone] Fax:  [phone]     Social Drivers of Health (SDOH) Social History: SDOH Screenings   Food Insecurity: No Food Insecurity (06/27/2024)  Recent Concern: Food Insecurity - Food Insecurity Present (05/23/2024)  Housing: Low Risk  (06/27/2024)  Recent Concern: Housing - High Risk (05/23/2024)  Transportation Needs: No Transportation Needs (06/27/2024)  Utilities: Not At Risk (06/27/2024)  Alcohol Screen: Low Risk  (05/23/2024)  Depression (PHQ2-9): Medium Risk (03/31/2024)  Financial Resource Strain: Medium Risk (05/23/2024)  Physical Activity: Inactive (05/23/2024)  Social Connections: Moderately Integrated (06/02/2024)  Stress: Stress Concern Present (05/23/2024)  Tobacco Use: Medium Risk (06/27/2024)   SDOH Interventions:     Readmission Risk Interventions     No data to display

## 2024-06-29 ENCOUNTER — Inpatient Hospital Stay

## 2024-06-29 DIAGNOSIS — R079 Chest pain, unspecified: Secondary | ICD-10-CM

## 2024-06-29 LAB — TROPONIN T, HIGH SENSITIVITY: Troponin T High Sensitivity: <15 ng/L (ref 0–19)

## 2024-06-29 MED ORDER — HYDROMORPHONE HCL 1 MG/ML IJ SOLN: 0.5000 mg | INTRAMUSCULAR | Status: DC | PRN

## 2024-06-29 MED ORDER — TIZANIDINE HCL 4 MG PO TABS
4.0000 mg | ORAL_TABLET | Freq: Three times a day (TID) | ORAL | Status: DC
  Administered 2024-06-29 – 2024-07-03 (×11): 4 mg via ORAL
  Filled 2024-06-29 (×12): qty 1

## 2024-06-29 MED ORDER — HYDROMORPHONE HCL 1 MG/ML IJ SOLN
0.5000 mg | INTRAMUSCULAR | Status: DC | PRN
  Administered 2024-06-29: 0.5 mg via INTRAVENOUS
  Filled 2024-06-29: qty 1

## 2024-06-29 MED ORDER — SODIUM CHLORIDE 0.9 % IV BOLUS
500.0000 mL | Freq: Once | INTRAVENOUS | Status: AC
  Administered 2024-06-29: 500 mL via INTRAVENOUS

## 2024-06-29 MED ORDER — KETOROLAC TROMETHAMINE 30 MG/ML IJ SOLN
30.0000 mg | Freq: Three times a day (TID) | INTRAMUSCULAR | Status: AC
  Administered 2024-06-29 – 2024-06-30 (×3): 30 mg via INTRAVENOUS
  Filled 2024-06-29 (×3): qty 1

## 2024-06-29 NOTE — Progress Notes (Signed)
 Pt has tolerated CSF drainage x 2 days.   Orders placed to clamp lumbar drain.  Continue dressing assessments If CSF appear drainage from drain site or incision, lay patient flat, drain 10mL of CSF and contact neurosurgery  PT and OT orders placed to start tomorrow (11/27)  Please contact on call neurosurgeon with any questions or concerns.   Lavoy Bernards

## 2024-06-29 NOTE — Plan of Care (Signed)
  Problem: Education: Goal: Knowledge of General Education information will improve Description: Including pain rating scale, medication(s)/side effects and non-pharmacologic comfort measures Outcome: Progressing   Problem: Nutrition: Goal: Adequate nutrition will be maintained Outcome: Progressing   Problem: Coping: Goal: Level of anxiety will decrease Outcome: Progressing   Problem: Elimination: Goal: Will not experience complications related to urinary retention Outcome: Progressing   Problem: Pain Managment: Goal: General experience of comfort will improve and/or be controlled Outcome: Progressing   Problem: Safety: Goal: Ability to remain free from injury will improve Outcome: Progressing   Problem: Activity: Goal: Ability to avoid complications of mobility impairment will improve Outcome: Progressing Goal: Ability to tolerate increased activity will improve Outcome: Progressing Goal: Will remain free from falls Outcome: Progressing   Problem: Pain Management: Goal: Pain level will decrease Outcome: Progressing

## 2024-06-29 NOTE — Progress Notes (Signed)
   Neurosurgery Progress Note  History: Mary Sanford is s/p expansion laminectomy fort exploration and repair of CSF leak and placement of lumbar drain  POD2: Pt reporting back and leg pain. Unchanged this morning  POD1: Pt experiencing back pain and left leg pain this morning which is not new from pre-op   Physical Exam: Vitals:   06/29/24 0800 06/29/24 1204  BP: 106/68 116/63  Pulse: 71   Resp: 13   Temp: 98.5 F (36.9 C) 98.6 F (37 C)  SpO2: 100%     AA Ox3 CNI  Strength:MAEW  Incision covered with clean and dry dressing   Data:  Other tests/results: see results review   Assessment/Plan:  Mary Sanford is a 59 y.o presenting with recurrent CSF leak s/p expansion laminectomy fort exploration and repair of CSF leak and placement of lumbar drain  - pt to remain on bed rest at this time. Will likely clamp this afternoon/ evening  - continue frequent dressing checks  - continue lumbar drain at 10cc/hr - pain control - DVT prophylaxis  Edsel Goods PA-C Department of Neurosurgery

## 2024-06-29 NOTE — Progress Notes (Signed)
 Patient complaining of chest pain that is not well relieved with pain mediation and numbness of right hand/fingers. EKG obtained and Dr. Claudene contacted. Suspected that the pain and numbness may be positional in nature due to ordered bedrest with HOB flat, but chest x-ray and troponin ordered to rule out other potential causes for discomfort.  Arlean FORBES Bowers, RN

## 2024-06-30 LAB — BASIC METABOLIC PANEL WITH GFR
Anion gap: 8 (ref 5–15)
BUN: 18 mg/dL (ref 6–20)
CO2: 21 mmol/L — ABNORMAL LOW (ref 22–32)
Calcium: 8.8 mg/dL — ABNORMAL LOW (ref 8.9–10.3)
Chloride: 111 mmol/L (ref 98–111)
Creatinine, Ser: 0.85 mg/dL (ref 0.44–1.00)
GFR, Estimated: >60 mL/min (ref >60)
Glucose, Bld: 147 mg/dL — ABNORMAL HIGH (ref 70–99)
Potassium: 3.6 mmol/L (ref 3.5–5.1)
Sodium: 140 mmol/L (ref 135–145)

## 2024-06-30 LAB — CBC
HCT: 31.8 % — ABNORMAL LOW (ref 36.0–46.0)
Hemoglobin: 10 g/dL — ABNORMAL LOW (ref 12.0–15.0)
MCH: 23.4 pg — ABNORMAL LOW (ref 26.0–34.0)
MCHC: 31.4 g/dL (ref 30.0–36.0)
MCV: 74.5 fL — ABNORMAL LOW (ref 80.0–100.0)
Platelets: 168 K/uL (ref 150–400)
RBC: 4.27 MIL/uL (ref 3.87–5.11)
RDW: 15.2 % (ref 11.5–15.5)
WBC: 7.2 K/uL (ref 4.0–10.5)
nRBC: 0 % (ref 0.0–0.2)

## 2024-06-30 LAB — GLUCOSE, CAPILLARY
Glucose-Capillary: 146 mg/dL — ABNORMAL HIGH (ref 70–99)
Glucose-Capillary: 212 mg/dL — ABNORMAL HIGH (ref 70–99)

## 2024-06-30 MED ORDER — NOREPINEPHRINE 4 MG/250ML-% IV SOLN: 0.0000 ug/min | INTRAVENOUS | Status: DC

## 2024-06-30 MED ORDER — HYDROCORTISONE SOD SUC (PF) 100 MG IJ SOLR
100.0000 mg | Freq: Three times a day (TID) | INTRAMUSCULAR | Status: DC
  Administered 2024-06-30 – 2024-07-02 (×5): 100 mg via INTRAVENOUS
  Filled 2024-06-30 (×6): qty 2

## 2024-06-30 MED ORDER — LACTATED RINGERS IV BOLUS
1000.0000 mL | Freq: Once | INTRAVENOUS | Status: AC
  Administered 2024-06-30: 1000 mL via INTRAVENOUS

## 2024-06-30 MED ORDER — MIDODRINE HCL 5 MG PO TABS
10.0000 mg | ORAL_TABLET | Freq: Three times a day (TID) | ORAL | Status: DC
  Administered 2024-06-30 – 2024-07-01 (×2): 10 mg via ORAL
  Filled 2024-06-30 (×2): qty 2

## 2024-06-30 MED ORDER — SODIUM CHLORIDE 0.9 % IV BOLUS
1000.0000 mL | Freq: Once | INTRAVENOUS | Status: AC
  Administered 2024-06-30: 1000 mL via INTRAVENOUS

## 2024-06-30 NOTE — Evaluation (Addendum)
 Physical Therapy Evaluation Patient Details Name: Mary Sanford MRN: [id number] DOB: 10/29/1965 Today's Date: 06/30/2024  History of Present Illness  admitted for acute hospitalization s/p expansion of laminectomy secondary to recurrent CSF leak; lumbar drain placed during procedure (removed 11/27)  Clinical Impression  Patient resting in bed (R sidelying) upon arrival to room; sleeping, but easily awakens to voice, light touch.  Oriented to self, location and general situation.  Generally lethargic (requiring constant cuing for alertness, attention to task) and intermittently confused throughout session.  Follows simple commands, but does require increased time, cuing for processing, task initiation and overall sequencing. Endorses significant pain in back, somewhat inconsistent with functional presentation/performance.  Pain generally rated 6/10 per FACES scale throughout session. Bilat UE/LE strength and ROM grossly symmetrical and WFL for basic transfers and mobility (at least 3-/5); no focal weakness, sensory or coordination deficit appreciated.  Does endorse some radicular symptoms L > R LE, but has difficulty fully describing/quantifying. Currently requiring mod assist for bed mobility; min/mod assist for sit/stand, standing balance and basic transfers with RW.  Demonstrates difficulty cognitively processing task due to lethargy, generalized confusion at times; constant cuing for eyes open and alertness/awareness of task.  Hand-over-hand assist to guide RW and facilitate advancement with transfer. Additional gait/mobility deferred as result; will continue to assess/progress in subsequent sessions as appropriate. Of note, vitals stable and WFL throughout session, minimally changed with position.  Patient asymptomatic throughout.  BP end of session 98/75; patient seated in chair, eating lunch.  Surgical incision clean and dry end of session after mobility efforts.  RN informed/aware of progress  during session, status end of session. Would benefit from skilled PT to address above deficits and promote optimal return to PLOF; recommend post-acute PT follow up as indicated by interdisciplinary care team.          If plan is discharge home, recommend the following: A lot of help with walking and/or transfers;A lot of help with bathing/dressing/bathroom   Can travel by private vehicle        Equipment Recommendations Rolling walker (2 wheels);BSC/3in1  Recommendations for Other Services       Functional Status Assessment Patient has had a recent decline in their functional status and demonstrates the ability to make significant improvements in function in a reasonable and predictable amount of time.     Precautions / Restrictions Precautions Precautions: Fall;Back Restrictions Weight Bearing Restrictions Per Provider Order: No      Mobility  Bed Mobility Overal bed mobility: Needs Assistance Bed Mobility: Supine to Sit     Supine to sit: Mod assist          Transfers Overall transfer level: Needs assistance Equipment used: Rolling walker (2 wheels) Transfers: Sit to/from Stand, Bed to chair/wheelchair/BSC Sit to Stand: Min assist, Mod assist Stand pivot transfers: Min assist, Mod assist         General transfer comment: difficulty cognitively processing task due to lethargy, generalized confusion at times; constant cuing for eyes open and alertness/awareness of task.  Hand-over-hand assist to guide RW and facilitate advancement with transfer    Ambulation/Gait               General Gait Details: deferred due to lethargy/fatigue  Stairs            Wheelchair Mobility     Tilt Bed    Modified Rankin (Stroke Patients Only)       Balance Overall balance assessment: Needs assistance Sitting-balance support: No upper  extremity supported, Feet supported Sitting balance-Leahy Scale: Fair Sitting balance - Comments: tends to list backwards  with fatigue or divided attention   Standing balance support: Bilateral upper extremity supported Standing balance-Leahy Scale: Poor                               Pertinent Vitals/Pain Pain Assessment Pain Assessment: Faces Faces Pain Scale: Hurts even more Pain Location: back Pain Descriptors / Indicators: Grimacing Pain Intervention(s): Limited activity within patient's tolerance, Monitored during session, Repositioned    Home Living Family/patient expects to be discharged to:: Private residence Living Arrangements: Spouse/significant other;Children Available Help at Discharge: Family;Available 24 hours/day Type of Home: House Home Access: Level entry       Home Layout: Two level;Bed/bath upstairs;1/2 bath on main level Home Equipment: Rolling Walker (2 wheels);Cane - single point;BSC/3in1;Crutches      Prior Function Prior Level of Function : Independent/Modified Independent;Working/employed;Driving             Mobility Comments: At baseline, indep with ADLs, household and community mobilization; denies fall history.  Intermittent use of RW post-procedures, but had weaned off prior to this surgery. 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  Endorses radicular/shooting pain down LEs (L > R) at times; difficulty fully describing/quantifying.)       Communication        Cognition Arousal: Lethargic Behavior During Therapy: WFL for tasks assessed/performed                           PT - Cognition Comments: Oriented to self, location, situation; frequent cuing/stimulation for full alertness and attention to task Following commands: Impaired Following  commands impaired: Follows one step commands inconsistently     Cueing Cueing Techniques: Verbal cues, Gestural cues, Tactile cues     General Comments      Exercises Other Exercises Other Exercises: Oral care and light grooming, seated in recliner, set up/supervision--but does require cuing for alertness with functional tasks. Other Exercises: Set up with meal tray prior to leaving room; dep to manipulate/open small containers.  Patient using bilat UEs (negotiating utensils) to feed self from meal tray. Other Exercises: BP 98/75 end of session; asymptomatic.  RN informed/aware of performance during session, status end of session   Assessment/Plan    PT Assessment Patient needs continued PT services  PT Problem List Decreased strength;Decreased activity tolerance;Decreased mobility;Decreased coordination;Decreased cognition;Decreased knowledge of use of DME;Decreased safety awareness;Decreased balance;Decreased knowledge of precautions;Pain       PT Treatment Interventions DME instruction;Neuromuscular re-education;Gait training;Stair training;Patient/family education;Functional mobility training;Therapeutic activities;Therapeutic exercise;Balance training;Cognitive remediation    PT Goals (Current goals can be found in the Care Plan section)  Acute Rehab PT Goals PT Goal Formulation: Patient unable to participate in goal setting Time For Goal Achievement: 07/14/24 Potential to Achieve Goals: Good    Frequency Min 3X/week     Co-evaluation               AM-PAC PT 6 Clicks Mobility  Outcome Measure Help needed turning from your back to your side while in a flat bed without using bedrails?: A Little Help needed moving from lying  on your back to sitting on the side of a flat bed without using bedrails?: A Lot Help needed moving to and from a bed to a chair (including a wheelchair)?: A Lot Help needed standing up from a chair using your arms (e.g., wheelchair or bedside  chair)?: A Lot Help needed to walk in hospital room?: A Lot Help needed climbing 3-5 steps with a railing? : A Lot 6 Click Score: 13    End of Session Equipment Utilized During Treatment: Gait belt Activity Tolerance: Patient tolerated treatment well Patient left: in chair;with call bell/phone within reach Nurse Communication: Mobility status PT Visit Diagnosis: Muscle weakness (generalized) (M62.81);Difficulty in walking, not elsewhere classified (R26.2)    Time: 8475-8449 PT Time Calculation (min) (ACUTE ONLY): 26 min   Charges:   PT Evaluation $PT Eval Moderate Complexity: 1 Mod   PT General Charges $$ ACUTE PT VISIT: 1 Visit         Odes Lolli H. Delores, PT, DPT, NCS 06/30/24, 4:43 PM [phone]

## 2024-06-30 NOTE — Plan of Care (Signed)
  Problem: Education: Goal: Knowledge of General Education information will improve Description: Including pain rating scale, medication(s)/side effects and non-pharmacologic comfort measures Outcome: Progressing   Problem: Coping: Goal: Level of anxiety will decrease Outcome: Progressing   Problem: Pain Managment: Goal: General experience of comfort will improve and/or be controlled Outcome: Progressing   Problem: Safety: Goal: Ability to remain free from injury will improve Outcome: Progressing   Problem: Activity: Goal: Will remain free from falls Outcome: Progressing   Problem: Pain Management: Goal: Pain level will decrease Outcome: Progressing

## 2024-06-30 NOTE — Progress Notes (Signed)
 Neurosurgery Progress Note  History: Mary Sanford is s/p expansion laminectomy fort exploration and repair of CSF leak and placement of lumbar drain. She is 3 Days Post-Op.   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  POD2: Pt reporting back and leg pain. Unchanged this morning  POD1: Pt experiencing back pain and left leg pain this morning which is not new from pre-op   Physical Exam: Vitals:   06/30/24 0530 06/30/24 0600  BP: 132/81 137/77  Pulse: 77 78  Resp: 15 16  Temp:    SpO2: 100% 100%    AA Ox3 CNI  Strength:MAEW  Incision covered with clean and dry dressing   Data:    Latest Ref Rng & Units 06/30/2024   12:23 AM 06/27/2024    8:19 PM 06/19/2024    2:16 PM  BMP  Glucose 70 - 99 mg/dL 852   91   BUN 6 - 20 mg/dL 18   18   Creatinine 9.55 - 1.00 mg/dL 9.14  9.23  9.20   Sodium 135 - 145 mmol/L 140   141   Potassium 3.5 - 5.1 mmol/L 3.6   4.2   Chloride 98 - 111 mmol/L 111   108   CO2 22 - 32 mmol/L 21   25   Calcium  8.9 - 10.3 mg/dL 8.8   9.6       Latest Ref Rng & Units 06/30/2024   12:23 AM 06/27/2024    8:19 PM 06/19/2024    2:16 PM  CBC  WBC 4.0 - 10.5 K/uL 7.2  6.3  6.2   Hemoglobin 12.0 - 15.0 g/dL 89.9  87.5  87.8   Hematocrit 36.0 - 46.0 % 31.8  38.9  38.7   Platelets 150 - 400 K/uL 168  205  220     Intake/Output Summary (Last 24 hours) at 06/30/2024 0824 Last data filed at 06/30/2024 0500 Gross per 24 hour  Intake 1222.91 ml  Output 2090 ml  Net -867.09 ml     Assessment/Plan:  Mary Sanford is a 58 y.o presenting with recurrent CSF leak s/p expansion laminectomy fort exploration and repair of CSF leak and placement of lumbar drain.   - Clamped 12/26 afternoon.  Incision was dry, drain removed.  Patient can mobilize. - continue frequent dressing checks  - pain control, no more IV meds given overnarcatization y/d.  We had a long discussion this morning about her pain reporting so that we could more safely and  effectively manage her pain level.  She was stating a 10 out of 10 pain even while somnolent and actively falling asleep while talking with us  yesterday.  I let her know that it is unsafe to over medicate as it can cause low blood pressures like she had yesterday or decreased ability to to breathe. - DVT prophylaxis  Penne LELON Sharps MD Department of Neurosurgery

## 2024-06-30 NOTE — Consult Note (Addendum)
 NAME:  Mary Sanford, MRN:  [id number], DOB:  Apr 06, 1966, LOS: 3 ADMISSION DATE:  06/27/2024, CONSULTATION DATE:  06/30/24 REFERRING MD:  Claudene Riis REASON FOR CONSULT:  Hypotension   HPI  Mary Sanford is a 58 year old female with a history of HTN (including malignant HTN), HLD, hyperaldosteronism, iron deficiency anemia, anxiety and depression, chronic lumbar radiculopathy/spinal stenosis s/p L4-S1 laminectomy and decompression (04/22/24), with a complicated postoperative course marked by recurrent CSF leak.  On review of chart, she required multiple postoperative evaluations and repairs (06/02/24, outpatient visits). Repeat MRI on 06/18/24 again demonstrated a large posterior fluid collection (9.0 cm) consistent with persistent CSF leak. She subsequently underwent expansion of prior laminectomy, exploration, primary dural repair, and lumbar drain placement on 06/27/24 with Neurosurgery.  Postoperatively, she was admitted to ICU/step-down for monitoring with lumbar drain now clamped. On 06/29/24, nursing and Neurosurgery noted persistent hypotension, with MAPs in the low 60s despite prior fluid administration. The patient has had moderate back pain and left leg pain at her preoperative baseline. No syncope, mild chest pain without dyspnea. She denies fever or chills. Urine output noted to be decreased relative to prior 24 hours per nursing report.  PCCM was consulted for evaluation and management of postoperative hypotension.  SEE SIGNIFICANT EVENTS BELOW   Past Medical History  Chronic low back pain with radiculopathy Postoperative CSF leak (recurrent) Hypertension, including prior episodes of malignant HTN Hyperaldosteronism Iron deficiency anemia HLD Pre-diabetes GAD, panic disorder, depression History of kidney stones S/P Roux-en-Y gastric bypass (2019) Stress urinary incontinence Seasonal allergies  Significant Hospital Events   10/26-10/27/25: Presented to [facility]  with First post-op concern for CSF leak. MRI under anesthesia ? confirmed delayed postoperative CSF leak. Neurosurgeon (Dr. Claudene) initially considered transfer back to [place] but ultimately recommended outpatient follow-up and future surgical repair. Headache was manageable with oral analgesics ? discharged. 06/02/24: Returned to ED with persistent symptoms consistent with CSF leak. Sedated MRI again showed CSF leak/pseudo meningocele. Underwent CSF leak repair. Discharged 06/04/24. 06/19/24: Presented to the ED, again concerning for persistent/recurrent CSF leak. Neurosurgery recommended outpatient repair scheduled for 06/27/24. Started on Diamox  (acetazolamide ). Case reviewed with Dr. Clois, who agreed to attempt primary closure with consideration of lumbar drain. 06/27/24: Presented to the hospital for elective surgery s/p Expansion of laminectomy for exploration, Primary Dural closure and Placement of lumbar drain. Patient transferred to ICU/step-down for postoperative monitoring. 06/28/24: Continues to report back pain and left leg pain, unchanged from pre-operative baseline. Lumbar drain in place for CSF diversion. Clamped per Neurosuregry 06/30/24: Patient developed hypotension, PCCM consulted to assist with management  Consults:  PCCM  Procedures:  04/22/2024 - L4-S1 lumbar laminectomy and decompression (midline approach) for spinal stenosis and radiculopathy. 06/02/2024 - CSF Leak Repair 06/27/2024 - Revision Lumbar Surgery for Recurrent CSF Leak (Expansion of prior laminectomy for exploration, dural closure performed and Lumbar drain placement for CSF diversion).  Interim History / Subjective:    - Micro Data:  None  Antimicrobials:  06/27/2024 - Cefazolin  (Ancef ) 2 g IV administered pre-operatively and continued per standard postoperative prophylaxis protocol.  OBJECTIVE  Blood pressure (!) 86/58, pulse 63, temperature 98.1 F (36.7 C), temperature source Axillary, resp.  rate 12, height 5' 9 (1.753 m), weight 94.2 kg, last menstrual period 05/15/2021, SpO2 100%.        Intake/Output Summary (Last 24 hours) at 06/30/2024 0053 Last data filed at 06/30/2024 0000 Gross per 24 hour  Intake 243 ml  Output 2310 ml  Net -  2067 ml   Filed Weights   06/27/24 2000 06/28/24 0500  Weight: 94.2 kg 94.2 kg    Physical Examination  GEN: Critically ill-appearing, WDWN, NAD. HEENT: /AT, PERRL, sclerae anicteric. HEART: RRR, no M/R/G. LUNGS: CTAB, no increased WOB. ABDOMEN: Soft, NTND, BS x4. EXTREMITIES: No edema, cap refill <2 sec. NEURO: AO4, moves all extremities, no focal deficits. PSYCH: Mood/affect normal. SKIN: Warm, intact; lumbar drain in place, dressing clean and intact.  Labs/imaging that I havepersonally reviewed  (right click and Reselect all SmartList Selections daily)   DG Chest Port 1 View Result Date: 06/29/2024 EXAM: 1 VIEW(S) XRAY OF THE CHEST 06/29/2024 03:31:00 AM COMPARISON: 09/03/2023 CLINICAL HISTORY: 855384 Pain 144615 Pain FINDINGS: LUNGS AND PLEURA: No focal pulmonary opacity. No pleural effusion. No pneumothorax. HEART AND MEDIASTINUM: No acute abnormality of the cardiac and mediastinal silhouettes. BONES AND SOFT TISSUES: No acute osseous abnormality. IMPRESSION: 1. No acute cardiopulmonary process. Electronically signed by: Franky Crease MD 06/29/2024 03:38 AM EST RP Workstation: [id number]    Labs   CBC: Recent Labs  Lab 06/27/24 2019 06/30/24 0023  WBC 6.3 7.2  HGB 12.4 10.0*  HCT 38.9 31.8*  MCV 73.5* 74.5*  PLT 205 168    Basic Metabolic Panel: Recent Labs  Lab 06/27/24 2019 06/30/24 0023  NA  --  140  K  --  3.6  CL  --  111  CO2  --  21*  GLUCOSE  --  147*  BUN  --  18  CREATININE 0.76 0.85  CALCIUM   --  8.8*   GFR: Estimated Creatinine Clearance: 89.2 mL/min (by C-G formula based on SCr of 0.85 mg/dL). Recent Labs  Lab 06/27/24 2019 06/30/24 0023  WBC 6.3 7.2    Liver Function Tests: No  results for input(s): AST, ALT, ALKPHOS, BILITOT, PROT, ALBUMIN in the last 168 hours. No results for input(s): LIPASE, AMYLASE in the last 168 hours. No results for input(s): AMMONIA in the last 168 hours.  ABG    Component Value Date/Time   TCO2 26 01/29/2015 0006     Coagulation Profile: No results for input(s): INR, PROTIME in the last 168 hours.  Cardiac Enzymes: No results for input(s): CKTOTAL, CKMB, CKMBINDEX, TROPONINI in the last 168 hours.  HbA1C: Hgb A1c MFr Bld  Date/Time Value Ref Range Status  02/16/2024 11:55 AM 6.1 4.6 - 6.5 % Final    Comment:    Glycemic Control Guidelines for People with Diabetes:Non Diabetic:  <6%Goal of Therapy: <7%Additional Action Suggested:  >8%   06/11/2023 03:06 PM 6.2 4.6 - 6.5 % Final    Comment:    Glycemic Control Guidelines for People with Diabetes:Non Diabetic:  <6%Goal of Therapy: <7%Additional Action Suggested:  >8%    CBG: Recent Labs  Lab 06/27/24 1942  GLUCAP 136*   Review of Systems:   General: No fevers, chills, or weight loss; fatigue related to recent surgery. HEENT: No headaches beyond known CSF leak-related pressure headaches; no vision changes, epistaxis, or sinus pain. Cardiovascular: No chest pain, palpitations, or edema. Respiratory: No shortness of breath, cough, or hemoptysis. Gastrointestinal: No nausea, vomiting, diarrhea, constipation; abdominal pain at baseline; bowel sounds normal. Genitourinary: No dysuria, hematuria, or urinary frequency; history of kidney stones. Musculoskeletal: Chronic low back pain and radiculopathy; left leg pain unchanged from baseline; no new joint pain or swelling. Neurological: Alert and oriented 4; no weakness, numbness, tingling, dizziness, or seizures. Psychiatric: Mood and affect normal; anxiety and depression controlled. Skin: No rashes, lesions, or ulcers;  surgical incision and lumbar drain intact. Endocrine: No polyuria, polydipsia, or  heat/cold intolerance. Hematologic/Lymphatic: No easy bruising, bleeding, or lymphadenopathy. Allergic/Immunologic: No new allergies; history of naproxen  reaction; no recurrent infections.  Past Medical History  She,  has a past medical history of Allergy, Arthritis, Chronic low back pain, Depression, Dyslipidemia, Family history of adverse reaction to anesthesia, GAD (generalized anxiety disorder), History of kidney stones, Hyperaldosteronism, Iron deficiency anemia, Malignant hypertension, Panic disorder, PMB (postmenopausal bleeding), Postoperative CSF leak, Pre-diabetes, S/P gastric bypass (06/22/2018), S/P lumbar laminectomy, Seasonal allergies, SUI (stress urinary incontinence, female), Uterine fibroid, Wears contact lenses, and Wears glasses.   Surgical History    Past Surgical History:  Procedure Laterality Date   ABDOMINAL HYSTERECTOMY     BUNIONECTOMY Bilateral    CESAREAN SECTION  1989   COLONOSCOPY WITH PROPOFOL   11/05/2020   dr glo   CYSTOSCOPY N/A 11/03/2023   Procedure: CYSTOSCOPY;  Surgeon: Jeralyn Crutch, MD;  Location: [facility];  Service: Gynecology;  Laterality: N/A;   CYSTOSCOPY W/ RETROGRADES Left 06/28/2018   Procedure: CYSTOSCOPY WITH RETROGRADE PYELOGRAM LEFT STENT;  Surgeon: Devere Lonni Righter, MD;  Location: [facility];  Service: Urology;  Laterality: Left;   cystoscopy with stents and removal of kidney stones     x 3 times   CYSTOSCOPY/URETEROSCOPY/HOLMIUM LASER/STENT PLACEMENT Left 07/16/2018   Procedure: CYSTOSCOPY/URETEROSCOPY/HOLMIUM LASER/STENT EXCHANGE;  Surgeon: Devere Lonni Righter, MD;  Location: [facility];  Service: Urology;  Laterality: Left;   DILATATION & CURETTAGE/HYSTEROSCOPY WITH MYOSURE N/A 12/13/2019   Procedure: DILATATION & CURETTAGE/HYSTEROSCOPY WITH MYOSURE;  Surgeon: Jannis Kate Norris, MD;  Location: [facility];  Service: Gynecology;  Laterality: N/A;   DILITATION & CURRETTAGE/HYSTROSCOPY  WITH NOVASURE ABLATION N/A 06/17/2013   Procedure: DILATATION & CURETTAGE/HYSTEROSCOPY WITH NOVASURE ABLATION;  Surgeon: Gloris DELENA Hugger, MD;  Location: [facility];  Service: Gynecology;  Laterality: N/A;   ESOPHAGOGASTRODUODENOSCOPY (EGD) WITH PROPOFOL   10/11/2020   dr glo   FINGER SURGERY  04/2021   thumb   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   Procedure: LAPAROSCOPIC ROUX-EN-Y GASTRIC BYPASS WITH UPPER ENDOSCOPY;  Surgeon: Tanda Locus, MD;  Location: [facility];  Service: General;  Laterality: N/A;   HEMORRHOIDECTOMY WITH HEMORRHOID BANDING  10/19/2012   SCA THD Hem ligation/pexy   LAMINECTOMY FOR CEREBROSPINAL FLUID LEAK N/A 06/02/2024   Procedure: Laminectomy for postoperative CSF leak;  Surgeon: Claudene Penne ORN, MD;  Location: [facility];  Service: Neurosurgery;  Laterality: N/A;   LAMINECTOMY FOR CEREBROSPINAL FLUID LEAK N/A 06/27/2024   Procedure: Reexploration and extension of laminectomy for recurrent pseudomeningocele/CSF leak;  Surgeon: Claudene Penne ORN, MD;  Location: [facility];  Service: Neurosurgery;  Laterality: N/A;   LAPAROSCOPIC TUBAL LIGATION Bilateral 2001   LUMBAR LAMINECTOMY/DECOMPRESSION MICRODISCECTOMY N/A 04/22/2024   Procedure: L4-S1 Lumbar Laminectomy and Decompression, Midline Approach;  Surgeon: Claudene Penne ORN, MD;  Location: [facility];  Service: Neurosurgery;  Laterality: N/A;   OPERATIVE ULTRASOUND N/A 12/13/2019   Procedure: OPERATIVE ULTRASOUND;  Surgeon: Jannis Kate Norris, MD;  Location: [facility];  Service: Gynecology;  Laterality: N/A;   PLACEMENT OF LUMBAR DRAIN N/A 06/27/2024   Procedure: Placement of intraoperative lumbar drain;  Surgeon: Claudene Penne ORN, MD;  Location: [facility];  Service: Neurosurgery;  Laterality: N/A;   RADIOLOGY WITH ANESTHESIA N/A 05/30/2024   Procedure: MRI WITH ANESTHESIA;  Surgeon: Radiologist, Medication, MD;  Location: [facility];  Service: Radiology;  Laterality: N/A;   ROBOTIC ASSISTED LAPAROSCOPIC HYSTERECTOMY  AND SALPINGECTOMY Bilateral 11/03/2023  Procedure: XI ROBOTIC ASSISTED LAPAROSCOPIC HYSTERECTOMY AND SALPINGO-OOPHERECTOMY;  Surgeon: Jeralyn Crutch, MD;  Location: [facility];  Service: Gynecology;  Laterality: Bilateral;   SMALL INTESTINE SURGERY  06/22/2018   gastric bypass   TUBAL LIGATION     WISDOM TOOTH EXTRACTION       Social History   reports that she has quit smoking. Her smoking use included cigarettes. She has never used smokeless tobacco. She reports current alcohol use of about 1.0 - 2.0 standard drink of alcohol per week. She reports that she does not currently use drugs after having used the following drugs: Marijuana.   Family History   Her family history includes Anemia in her sister; Asthma in her mother; Cancer in her mother; Colon cancer (age of onset: 43) in her mother; Colon polyps in her mother; Deep vein thrombosis in her sister; Diabetes in her daughter, maternal grandmother, and sister; Heart attack in her maternal grandfather; Hypertension in her maternal uncle and mother; Hypotension in her sister; Hypothyroidism in her mother, sister, and sister; Kidney failure in her maternal grandmother; Other in her paternal grandmother and sister; Rectal cancer (age of onset: 66) in her mother; Seizures in her brother, paternal grandfather, and sister; Stroke in her maternal uncle. There is no history of Esophageal cancer, Stomach cancer, Heart disease, or Breast cancer.   Allergies Allergies  Allergen Reactions   Naproxen  Other (See Comments)    Panic attacks No Problem with Ibuprofen /Motrin      Home Medications  Prior to Admission medications   Medication Sig Start Date End Date Taking? Authorizing Provider  acetaminophen  (TYLENOL ) 500 MG tablet Take 2 tablets (1,000 mg total) by mouth every 6 (six) hours. 06/03/24  Yes Gregory Edsel Ruth, PA  acetaZOLAMIDE  (DIAMOX ) 125 MG tablet Take 1 tablet (125 mg total) by mouth 2 (two) times daily. 06/15/24  Yes Ulis Bottcher,  PA-C  amLODipine  (NORVASC ) 10 MG tablet Take 1 tablet (10 mg total) by mouth daily. Patient taking differently: Take 10 mg by mouth every morning. 05/23/24  Yes Joshua Debby CROME, MD  atorvastatin  (LIPITOR) 10 MG tablet Take 1 tablet by mouth once daily Patient taking differently: Take 10 mg by mouth every morning. 01/01/24  Yes Joshua Debby CROME, MD  escitalopram  (LEXAPRO ) 20 MG tablet Take 1 tablet by mouth once daily 06/25/24  Yes Joshua Debby CROME, MD  gabapentin  (NEURONTIN ) 300 MG capsule Take 1 capsule (300 mg total) by mouth 3 (three) times daily. 05/04/24  Yes Ulis Bottcher, PA-C  meloxicam  (MOBIC ) 7.5 MG tablet Take 1 tablet (7.5 mg total) by mouth daily. 03/28/24  Yes Ulis Bottcher, PA-C  Misc Natural Products (BEET ROOT PO) Take 2 tablets by mouth daily.   Yes [provider]  nebivolol  (BYSTOLIC ) 10 MG tablet Take 1 tablet by mouth once daily 06/23/24  Yes Joshua Debby CROME, MD  polyethylene glycol powder (MIRALAX ) 17 GM/SCOOP powder Take 17 g by mouth daily as needed for moderate constipation. Dissolve 1 capful (17g) in 4-8 ounces of liquid and take by mouth daily. 06/03/24  Yes Gregory Edsel Ruth, PA  senna (SENOKOT) 8.6 MG TABS tablet Take 1 tablet (8.6 mg total) by mouth 2 (two) times daily as needed for mild constipation. 06/03/24  Yes Gregory Edsel Ruth, PA  tiZANidine  (ZANAFLEX ) 2 MG tablet Take 1-2 tablets (2-4 mg total) by mouth every 8 (eight) hours as needed for muscle spasms. 06/03/24  Yes Gregory Edsel Ruth, PA  Scheduled Meds:  acetaminophen   1,000 mg Oral QID  acetaZOLAMIDE   125 mg Oral BID   amLODipine   10 mg Oral Daily   atorvastatin   10 mg Oral Daily   Chlorhexidine  Gluconate Cloth  6 each Topical Daily   enoxaparin  (LOVENOX ) injection  40 mg Subcutaneous Q24H   escitalopram   20 mg Oral Daily   gabapentin   300 mg Oral TID   ketorolac   30 mg Intravenous Q8H   nebivolol   10 mg Oral Daily   polyethylene glycol powder  17 g Oral Daily   senna  1 tablet Oral QHS    sodium chloride  flush  3 mL Intravenous Q12H   tiZANidine   4 mg Oral TID   Continuous Infusions:  norepinephrine  (LEVOPHED ) Adult infusion Stopped (06/30/24 0057)   PRN Meds:.menthol  **OR** phenol, ondansetron  **OR** ondansetron  (ZOFRAN ) IV, mouth rinse, oxyCODONE , oxyCODONE , sodium chloride  flush   Active Hospital Problem list   See systems below  Assessment & Plan:  #Postoperative Hypotension -- NEW POD#2 after revision laminectomy with lumbar drain. Likely multifactorial (fluid shifts, Diamox  diuresis, analgesics). No evidence of bleeding or infection.  -No evidence of active bleeding; Hgb previously stable. -No signs of sepsis; afebrile and normal WBC prior to hypotension. [phone] mL isotonic crystalloid bolus and reassess BP, UOP, mentation. -Start norepinephrine  if MAP remains < 65 after fluids; titrate to goal. -repeat CBC, BMP, lactate. -Consider random cortisol if hypotension persistent despite resuscitation. -Hold antihypertensives -Strict I/Os; ensure adequate UOP > 0.5 mL/kg/hr.  #Recurrent CSF Leak  Multiple episodes of postoperative CSF leak since initial laminectomy (04/22/24).Large fluid collection on MRI (11/15) consistent with persistent CSF leakage. s/p Revision Laminectomy, Primary Dural Closure, and Lumbar Drain 11/24 -Avoid Valsalva, straining, or heavy activity. -Continue acetazolamide  125 mg PO BID to reduce CSF production. -Monitor incision and drain output, daily neurochecks. -Lumbar drain management strictly per NSG orders -Monitor incision for further swelling or drainage. -Neurochecks per unit protocol.  #Chronic Lumbar Radiculopathy / Back Pain -Acetaminophen  1,000 mg PO QID,Gabapentin  300 mg PO TID,Ketorolac  30 mg IV Q8H (cautiously, monitor renal function and BP), Tizanidine  4 mg PO TID as needed for spasm -Avoid excessive sedation contributing to hypotension.  #Hypertension / History of Malignant Hypertension -Hold amlodipine  10 mg PO daily  and nebivolol  10 mg PO daily until hemodynamically stable. -Once stable, resume gradually as appropriate. -Continuous BP monitoring.  #Hyperaldosteronism -Chronic condition followed by PCP; normal MRI earlier in 2025. -No current electrolyte abnormalities consistent with acute exacerbation. -Continue to monitor BMP, especially potassium.  #Iron Deficiency Anemia History of IDA; recent Hgb 10 -No signs of acute blood loss. -Monitor Hgb/CBC. -Resume iron supplementation once oral intake tolerated if part of home regimen.  #Anxiety, Panic Disorder, and Depression -Continue escitalopram  20 mg PO daily. -Avoid benzodiazepines if contributing to hypotension or oversedation. -Offer non-pharmacologic reassurance and orientation.  #Hyperlipidemia -Resume statin/home regimen when able to take PO.  Best practice:  Diet:  Oral Pain/Anxiety/Delirium protocol (if indicated): No VAP protocol (if indicated): Not indicated DVT prophylaxis: LMWH GI prophylaxis: N/A Glucose control:  SSI No Central venous access:  N/A Arterial line:  N/A Foley:  N/A Mobility:  bed rest  PT/OT consulted: N/A Code Status:  full code Disposition: ICU   = Goals of Care =  Primary Emergency ContactBETHA Devora Needle, Home Phone: [phone]   Critical care time: 55 minutes        Almarie Nose DNP, CCRN, FNP-C, AGACNP-BC Acute Care & Family Nurse Practitioner [facility] PCCM on call pager [phone]

## 2024-07-01 ENCOUNTER — Other Ambulatory Visit: Payer: Self-pay | Admitting: Physician Assistant

## 2024-07-01 MED ORDER — ACETAZOLAMIDE 250 MG PO TABS
125.0000 mg | ORAL_TABLET | Freq: Every day | ORAL | Status: DC
  Administered 2024-07-02 – 2024-07-03 (×2): 125 mg via ORAL
  Filled 2024-07-01 (×2): qty 1

## 2024-07-01 NOTE — Progress Notes (Addendum)
 Physical Therapy Treatment Patient Details Name: Mary Sanford MRN: [id number] DOB: 1966/01/03 Today's Date: 07/01/2024   History of Present Illness admitted for acute hospitalization s/p expansion of laminectomy secondary to recurrent CSF leak; lumbar drain placed during procedure (removed 11/27)    PT Comments  Noted improvement in level of alertness/awareness this date.  Motivated and eager to progress with mobility. Completes gait (60' x2) with RW, cga/close sup; demonstrates reciprocal stepping pattern with good step height/length; fair cadence without buckling or LOB.  Improved pain control; min cuing for back precautions (to preven turning/twisting).  No reports of dizziness, lightheadedness this date.  Discharge recommendations updated to reflect progress and participation.    If plan is discharge home, recommend the following: A little help with walking and/or transfers;A little help with bathing/dressing/bathroom   Can travel by private vehicle        Equipment Recommendations  Rolling walker (2 wheels);BSC/3in1    Recommendations for Other Services       Precautions / Restrictions Precautions Precautions: Fall;Back Restrictions Weight Bearing Restrictions Per Provider Order: No     Mobility  Bed Mobility Overal bed mobility: Modified Independent                  Transfers Overall transfer level: Needs assistance Equipment used: Rolling walker (2 wheels), None Transfers: Sit to/from Stand             General transfer comment: completed with and without RW, no change in performance or level of assist    Ambulation/Gait Ambulation/Gait assistance: Contact guard assist Gait Distance (Feet):  (60' x2) Assistive device: Rolling walker (2 wheels)         General Gait Details: reciprocal stepping pattern with good step height/length; fair cadence without buckling or LOB.  Improved pain control; min cuing for back precautions (to preven  turning/twisting)   Stairs             Wheelchair Mobility     Tilt Bed    Modified Rankin (Stroke Patients Only)       Balance Overall balance assessment: Needs assistance Sitting-balance support: No upper extremity supported, Feet supported Sitting balance-Leahy Scale: Good     Standing balance support: Bilateral upper extremity supported Standing balance-Leahy Scale: Fair                              Hotel Manager: No apparent difficulties  Cognition Arousal: Alert Behavior During Therapy: WFL for tasks assessed/performed   PT - Cognitive impairments: No apparent impairments                         Following commands: Intact Following commands impaired: Only follows one step commands consistently    Cueing Cueing Techniques: Verbal cues, Gestural cues, Tactile cues  Exercises      General Comments        Pertinent Vitals/Pain Pain Assessment Pain Assessment: Faces Faces Pain Scale: Hurts a little bit Pain Location: back Pain Descriptors / Indicators: Aching, Discomfort Pain Intervention(s): Limited activity within patient's tolerance, Monitored during session, Repositioned    Home Living Family/patient expects to be discharged to:: Private residence Living Arrangements: Spouse/significant other;Children Available Help at Discharge: Family;Available 24 hours/day Type of Home: House Home Access: Level entry     Alternate Level Stairs-Number of Steps: flight Home Layout: Two level;Bed/bath upstairs;1/2 bath on main level Home Equipment: Rolling Walker (2 wheels);Cane - single  point;BSC/3in1;Crutches      Prior Function            PT Goals (current goals can now be found in the care plan section) Acute Rehab PT Goals PT Goal Formulation: Patient unable to participate in goal setting Time For Goal Achievement: 07/14/24 Potential to Achieve Goals: Good Progress towards PT goals: Progressing  toward goals    Frequency    Min 3X/week      PT Plan      Co-evaluation              AM-PAC PT 6 Clicks Mobility   Outcome Measure  Help needed turning from your back to your side while in a flat bed without using bedrails?: None Help needed moving from lying on your back to sitting on the side of a flat bed without using bedrails?: None Help needed moving to and from a bed to a chair (including a wheelchair)?: A Little Help needed standing up from a chair using your arms (e.g., wheelchair or bedside chair)?: A Little Help needed to walk in hospital room?: A Little Help needed climbing 3-5 steps with a railing? : A Little 6 Click Score: 20    End of Session Equipment Utilized During Treatment: Gait belt Activity Tolerance: Patient tolerated treatment well Patient left: in chair;with call bell/phone within reach Nurse Communication: Mobility status PT Visit Diagnosis: Muscle weakness (generalized) (M62.81);Difficulty in walking, not elsewhere classified (R26.2)     Time: 8499-8483 PT Time Calculation (min) (ACUTE ONLY): 16 min  Charges:    $Gait Training: 8-22 mins PT General Charges $$ ACUTE PT VISIT: 1 Visit                    Ellijah Leffel H. Delores, PT, DPT, NCS 07/01/24, 3:28 PM [phone]

## 2024-07-01 NOTE — Progress Notes (Addendum)
 Neurosurgery Progress Note  History: Mary Sanford is s/p expansion laminectomy fort exploration and repair of CSF leak and placement of lumbar drain. She is 4 Days Post-Op.   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  POD2: Pt reporting back and leg pain. Unchanged this morning  POD1: Pt experiencing back pain and left leg pain this morning which is not new from pre-op   Physical Exam: Vitals:   07/01/24 0500 07/01/24 0600  BP: (!) 158/83 (!) 158/88  Pulse: 66 72  Resp: 18 17  Temp:    SpO2: 99% 100%    AA Ox3 CNI  Strength:MAEW  Incision covered with clean and dry dressing   Data:    Latest Ref Rng & Units 06/30/2024   12:23 AM 06/27/2024    8:19 PM 06/19/2024    2:16 PM  BMP  Glucose 70 - 99 mg/dL 852   91   BUN 6 - 20 mg/dL 18   18   Creatinine 9.55 - 1.00 mg/dL 9.14  9.23  9.20   Sodium 135 - 145 mmol/L 140   141   Potassium 3.5 - 5.1 mmol/L 3.6   4.2   Chloride 98 - 111 mmol/L 111   108   CO2 22 - 32 mmol/L 21   25   Calcium  8.9 - 10.3 mg/dL 8.8   9.6       Latest Ref Rng & Units 06/30/2024   12:23 AM 06/27/2024    8:19 PM 06/19/2024    2:16 PM  CBC  WBC 4.0 - 10.5 K/uL 7.2  6.3  6.2   Hemoglobin 12.0 - 15.0 g/dL 89.9  87.5  87.8   Hematocrit 36.0 - 46.0 % 31.8  38.9  38.7   Platelets 150 - 400 K/uL 168  205  220     Intake/Output Summary (Last 24 hours) at 07/01/2024 0649 Last data filed at 07/01/2024 0300 Gross per 24 hour  Intake 6 ml  Output 2450 ml  Net -2444 ml     Assessment/Plan:  Mary Sanford is a 58 y.o presenting with recurrent CSF leak s/p expansion laminectomy fort exploration and repair of CSF leak and placement of lumbar drain.   - Clamped 12/26 afternoon.  Incision was dry, drain removed.  Patient can mobilize. - continue frequent dressing checks  - pain control as outlined. Patient has had two afternoon  episdoes of hypotension and somnolence without clear inciting event. Med/icu following  - DVT prophylaxis - Decreased diamoxx to 1 dose daily, patient noticed that this medication has made her feel off from her baseline   Penne LELON Sharps MD Department of Neurosurgery

## 2024-07-01 NOTE — Evaluation (Signed)
 Occupational Therapy Evaluation Patient Details Name: Mary Sanford MRN: [id number] DOB: 05/12/1966 Today's Date: 07/01/2024   History of Present Illness   admitted for acute hospitalization s/p expansion of laminectomy secondary to recurrent CSF leak; lumbar drain placed during procedure (removed 11/27)     Clinical Impressions Patient presenting with decreased Ind in self care, balance, functional mobility, transfers, endurance, and safety awareness. Patient reports being Ind at baseline with self care, mobility , and working part time. Pt lives at home with husband, daughter, and grandson. Upon entering the room, pt seated in recliner chair and agreeable to OT intervention. Pt does report some back pain but agreeable to continue working with therapist. She closes her eyes but opens them when therapist asks her too. She states,  I think I had too much medicine. Pt stands with min A from recliner chair. She ambulates with min A and use of RW 50' to RW station and back to room. Pt requesting to go to bed secondary to fatigue. Sit >supine with min A for sit >supine. Call bell and all needed items within reach. Pt states family will be home to assist her. Patient will benefit from acute OT to increase overall independence in the areas of ADLs, functional mobility, and safety awareness in order to safely discharge.     If plan is discharge home, recommend the following:   A little help with walking and/or transfers;A little help with bathing/dressing/bathroom;Assistance with cooking/housework;Help with stairs or ramp for entrance     Functional Status Assessment   Patient has had a recent decline in their functional status and demonstrates the ability to make significant improvements in function in a reasonable and predictable amount of time.     Equipment Recommendations   BSC/3in1      Precautions/Restrictions   Precautions Precautions: Fall;Back     Mobility Bed  Mobility               General bed mobility comments: seated in recliner chair at beginning and end of session    Transfers Overall transfer level: Needs assistance Equipment used: Rolling walker (2 wheels) Transfers: Sit to/from Stand, Bed to chair/wheelchair/BSC Sit to Stand: Min assist, Contact guard assist                  Balance Overall balance assessment: Needs assistance Sitting-balance support: Feet supported Sitting balance-Leahy Scale: Good     Standing balance support: Bilateral upper extremity supported, During functional activity, Reliant on assistive device for balance Standing balance-Leahy Scale: Fair                             ADL either performed or assessed with clinical judgement   ADL Overall ADL's : Needs assistance/impaired Eating/Feeding: Sitting;Independent                       Toilet Transfer: Minimal assistance;Rolling walker (2 wheels);Ambulation                   Vision Patient Visual Report: No change from baseline              Pertinent Vitals/Pain Pain Assessment Pain Assessment: Faces Faces Pain Scale: Hurts little more Pain Location: back Pain Descriptors / Indicators: Aching, Discomfort Pain Intervention(s): Limited activity within patient's tolerance, Monitored during session, Repositioned     Extremity/Trunk Assessment Upper Extremity Assessment Upper Extremity Assessment: Generalized weakness   Lower Extremity Assessment  Lower Extremity Assessment: Generalized weakness       Communication Communication Communication: No apparent difficulties   Cognition Arousal: Alert Behavior During Therapy: WFL for tasks assessed/performed Cognition: No apparent impairments                               Following commands: Impaired Following commands impaired: Follows one step commands inconsistently     Cueing  General Comments   Cueing Techniques: Verbal cues;Gestural  cues;Tactile cues              Home Living Family/patient expects to be discharged to:: Private residence Living Arrangements: Spouse/significant other;Children Available Help at Discharge: Family;Available 24 hours/day Type of Home: House Home Access: Level entry     Home Layout: Two level;Bed/bath upstairs;1/2 bath on main level Alternate Level Stairs-Number of Steps: flight Alternate Level Stairs-Rails: Right Bathroom Shower/Tub: Walk-in shower         Home Equipment: [employer] (2 wheels);Cane - single point;BSC/3in1;Crutches          Prior Functioning/Environment Prior Level of Function : Independent/Modified Independent;Working/employed;Driving             Mobility Comments: At baseline, indep with ADLs, household and community mobilization; denies fall history.  Intermittent use of RW post-procedures, but had weaned off prior to this surgery. ADLs Comments: Pt was working part time at [employer] in [employer] but hasn't since first surgery in sept 2025. Normally Ind in self care and IADLs.    OT Problem List: Decreased strength;Impaired balance (sitting and/or standing);Pain;Decreased safety awareness;Decreased activity tolerance;Decreased knowledge of use of DME or AE   OT Treatment/Interventions: Self-care/ADL training;Manual therapy;Therapeutic exercise;Patient/family education;Balance training;Energy conservation;Therapeutic activities      OT Goals(Current goals can be found in the care plan section)   Acute Rehab OT Goals Patient Stated Goal: to go home and decrease pain OT Goal Formulation: With patient Time For Goal Achievement: 07/15/24 Potential to Achieve Goals: Fair ADL Goals Pt Will Perform Grooming: with modified independence;standing Pt Will Perform Lower Body Dressing: with modified independence;sit to/from stand Pt Will Transfer to Toilet: with modified independence;ambulating Pt Will Perform Toileting - Clothing Manipulation and hygiene:  with modified independence;sit to/from stand   OT Frequency:  Min 2X/week       AM-PAC OT 6 Clicks Daily Activity     Outcome Measure Help from another person eating meals?: None Help from another person taking care of personal grooming?: A Little Help from another person toileting, which includes using toliet, bedpan, or urinal?: A Little Help from another person bathing (including washing, rinsing, drying)?: A Little Help from another person to put on and taking off regular upper body clothing?: None Help from another person to put on and taking off regular lower body clothing?: A Little 6 Click Score: 20   End of Session Equipment Utilized During Treatment: Rolling walker (2 wheels) Nurse Communication: Mobility status  Activity Tolerance: Patient tolerated treatment well Patient left: in chair;with call bell/phone within reach  OT Visit Diagnosis: Unsteadiness on feet (R26.81);Repeated falls (R29.6);Muscle weakness (generalized) (M62.81)                Time: 8961-8898 OT Time Calculation (min): 23 min Charges:  OT General Charges $OT Visit: 1 Visit OT Evaluation $OT Eval Low Complexity: 1 Low OT Treatments $Therapeutic Activity: 8-22 mins  Izetta Claude, MS, OTR/L , CBIS ascom [phone]  07/01/24, 1:16 PM

## 2024-07-01 NOTE — Progress Notes (Signed)
 0800 patient alert x4 able to make all needs known complains of pain  0900 patient stands at bedside with walker and urinated on floor and she presses her stomach stating she can't control the urination

## 2024-07-01 NOTE — Plan of Care (Signed)

## 2024-07-02 MED ORDER — NEBIVOLOL HCL 10 MG PO TABS
10.0000 mg | ORAL_TABLET | Freq: Every day | ORAL | Status: DC
  Administered 2024-07-02 – 2024-07-03 (×2): 10 mg via ORAL
  Filled 2024-07-02 (×2): qty 1

## 2024-07-02 MED ORDER — AMLODIPINE BESYLATE 10 MG PO TABS
10.0000 mg | ORAL_TABLET | Freq: Every day | ORAL | Status: DC
  Administered 2024-07-02 – 2024-07-03 (×2): 10 mg via ORAL
  Filled 2024-07-02 (×2): qty 1

## 2024-07-02 NOTE — Progress Notes (Signed)
 Neurosurgery Progress Note  History: Mary Sanford is s/p expansion laminectomy fort exploration and repair of CSF leak and placement of lumbar drain. She is 5 Days Post-Op.   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  POD2: Pt reporting back and leg pain. Unchanged this morning  POD1: Pt experiencing back pain and left leg pain this morning which is not new from pre-op   Physical Exam: Vitals:   07/01/24 2326 07/02/24 0401  BP: (!) 163/89 (!) 152/83  Pulse: (!) 56 (!) 55  Resp:  16  Temp:  97.7 F (36.5 C)  SpO2: 100% 100%    AA Ox3 CNI  Strength:MAEW  Incision covered with clean and dry dressing   Data:    Latest Ref Rng & Units 06/30/2024   12:23 AM 06/27/2024    8:19 PM 06/19/2024    2:16 PM  BMP  Glucose 70 - 99 mg/dL 852   91   BUN 6 - 20 mg/dL 18   18   Creatinine 9.55 - 1.00 mg/dL 9.14  9.23  9.20   Sodium 135 - 145 mmol/L 140   141   Potassium 3.5 - 5.1 mmol/L 3.6   4.2   Chloride 98 - 111 mmol/L 111   108   CO2 22 - 32 mmol/L 21   25   Calcium  8.9 - 10.3 mg/dL 8.8   9.6       Latest Ref Rng & Units 06/30/2024   12:23 AM 06/27/2024    8:19 PM 06/19/2024    2:16 PM  CBC  WBC 4.0 - 10.5 K/uL 7.2  6.3  6.2   Hemoglobin 12.0 - 15.0 g/dL 89.9  87.5  87.8   Hematocrit 36.0 - 46.0 % 31.8  38.9  38.7   Platelets 150 - 400 K/uL 168  205  220     Intake/Output Summary (Last 24 hours) at 07/02/2024 0653 Last data filed at 07/01/2024 1300 Gross per 24 hour  Intake 600 ml  Output 400 ml  Net 200 ml     Assessment/Plan:  Mary Sanford is a 58 y.o presenting with recurrent CSF leak s/p expansion laminectomy fort exploration and repair of CSF leak and placement of lumbar drain.   - Clamped 12/26 afternoon.  Incision was dry, drain removed.  Patient can mobilize. -  continue frequent dressing checks  - pain control as outlined.  - BP issues seemed tied to higher dose of diamox , patient doing much better with decreased dosing. Will resume home meds and watch for stability today.  - DVT prophylaxis - PT/OT plan for 11/30 AM discharge if stable.    Penne LELON Sharps MD Department of Neurosurgery

## 2024-07-02 NOTE — Plan of Care (Signed)

## 2024-07-02 NOTE — Plan of Care (Signed)
  Problem: Coping: Goal: Level of anxiety will decrease Outcome: Progressing   Problem: Pain Management: Goal: Pain level will decrease Outcome: Progressing

## 2024-07-03 ENCOUNTER — Other Ambulatory Visit: Payer: Self-pay

## 2024-07-03 MED ORDER — OXYCODONE HCL 10 MG PO TABS
10.0000 mg | ORAL_TABLET | ORAL | 0 refills | Status: DC | PRN
  Filled 2024-07-03: qty 42, 11d supply, fill #0

## 2024-07-03 MED ORDER — ACETAMINOPHEN 500 MG PO TABS
1000.0000 mg | ORAL_TABLET | Freq: Four times a day (QID) | ORAL | 0 refills | Status: AC | PRN
  Filled 2024-07-03: qty 30, 4d supply, fill #0

## 2024-07-03 MED ORDER — ACETAZOLAMIDE 125 MG PO TABS
125.0000 mg | ORAL_TABLET | Freq: Every day | ORAL | 0 refills | Status: DC
  Filled 2024-07-03: qty 21, 21d supply, fill #0

## 2024-07-03 NOTE — Plan of Care (Signed)

## 2024-07-03 NOTE — TOC Progression Note (Addendum)
 Transition of Care [facility]) - Progression Note    Patient Details  Name: Mary Sanford MRN: [id number] Date of Birth: 03-Sep-1965  Transition of Care [facility] CM/SW Contact  [place] L Uthman [place], [place] Phone Number: 07/03/2024, 9:04 AM  Clinical Narrative:     CSW met with patient. Spouse was at bedside. CSW and patient discussed choice for Home Health. CSW sent requests through the [facility] portal and [facility] accepted the referral. Start of care will be 12/2. Patient was agreeable to using [facility] for her [facility] needs.   DME discussed. Patient advised that she has a BSC and a RW at home.     Barriers to Discharge: Continued Medical Work up               Expected Discharge Plan and Services                                               Social Drivers of Health (SDOH) Interventions SDOH Screenings   Food Insecurity: No Food Insecurity (06/27/2024)  Recent Concern: Food Insecurity - Food Insecurity Present (05/23/2024)  Housing: Low Risk  (06/27/2024)  Recent Concern: Housing - High Risk (05/23/2024)  Transportation Needs: No Transportation Needs (06/27/2024)  Utilities: Not At Risk (06/27/2024)  Alcohol Screen: Low Risk  (05/23/2024)  Depression (PHQ2-9): Medium Risk (03/31/2024)  Financial Resource Strain: Medium Risk (05/23/2024)  Physical Activity: Inactive (05/23/2024)  Social Connections: Moderately Integrated (06/02/2024)  Stress: Stress Concern Present (05/23/2024)  Tobacco Use: Medium Risk (06/27/2024)    Readmission Risk Interventions     No data to display

## 2024-07-03 NOTE — Discharge Summary (Signed)
 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  She also had a lumbar drain placed at this time.  She was admitted to stepdown unit postoperatively for CSF drainage.  She remained flat for 48 hours and had her drain clamped.  Had no further symptomatology.  No leaking.  She was having some difficulty with her blood pressure control and intermittent confusion, the seem to be tied to her Diamox  dosing, we cut down the Diamox  dosing and she was doing much better with much fewer side effects.  She continued to progress.  On postoperative day 6 she was deemed stable for discharge both medically and surgically.  She was discharged home with home health.    Consultants: Medicine, PT OT Additional Procedures: None  Exam: Vitals:   07/03/24 0350  BP: 106/75  Pulse: (!) 51  Resp: 20  Temp: 98.2 F (36.8 C)  SpO2: 100%   The physical exam is generally normal. Patient appears well, alert and oriented x 3, pleasant, cooperative. Vitals are as noted. Abdomen is soft, no tenderness. Extremities are normal.  Neurological exam is is at her baseline.  She is moving much more smoothly.  No evidence of drainage from her incision.  The results of significant  diagnostics from this hospitalization (including imaging, microbiology, ancillary and laboratory) are listed below for reference.  Imaging Studies: DG Chest Port 1 View Result Date: 06/29/2024 EXAM: 1 VIEW(S) XRAY OF THE CHEST 06/29/2024 03:31:00 AM COMPARISON: 09/03/2023 CLINICAL HISTORY: [id number] Pain [id number] Pain FINDINGS: LUNGS AND PLEURA: No focal pulmonary opacity. No pleural effusion. No pneumothorax. HEART AND MEDIASTINUM: No acute abnormality of the cardiac and mediastinal silhouettes. BONES AND SOFT TISSUES: No acute osseous abnormality. IMPRESSION: 1. No acute cardiopulmonary process. Electronically signed by: Franky Crease MD 06/29/2024 03:38 AM EST RP Workstation: [id number]   DG Lumbar Spine 2-3 Views Result Date: 06/27/2024 EXAM: 2 or 3 VIEW(S) XRAY OF THE LUMBAR SPINE 06/27/2024 05:40:00 PM COMPARISON: MRI 06/17/2024. CLINICAL HISTORY: Intraoperative for expansion of laminectomy and exploration of CSF leak with primary closure and placement of lumbar drain. FINDINGS: LUMBAR SPINE: BONES: Posterior decompression observed at L4-L5. Instrumentation is present. Spot images demonstrate placement of a lumbar drain at about the L2-L3 level. DISCS AND DEGENERATIVE CHANGES: No severe degenerative changes. SOFT TISSUES: A lumbar drain is present. No acute abnormality. IMPRESSION: 1. Posterior decompression at L4-5. 2. Instrumentation and placement of a lumbar drain at about the L2-L3 level. Electronically signed by: Ryan Salvage MD 06/27/2024 09:40 PM EST RP Workstation: [id number]   DG C-Arm 1-60 Min-No Report Result Date: 06/27/2024 Fluoroscopy was utilized by the requesting physician.  No radiographic interpretation.   DG C-Arm 1-60 Min-No Report Result Date: 06/27/2024 Fluoroscopy was utilized by the requesting physician.  No radiographic interpretation.   MR LUMBAR SPINE WO CONTRAST Result Date: 06/18/2024 CLINICAL DATA:  Concern for CSF leak  following spinal surgery EXAM: MRI LUMBAR  SPINE WITHOUT CONTRAST TECHNIQUE: Multiplanar, multisequence MR imaging of the lumbar spine was performed. No intravenous contrast was administered. COMPARISON:  05/30/2024 FINDINGS: Segmentation:  Standard. Alignment:  Physiologic. Vertebrae: No fracture, evidence of discitis, or bone lesion. Posterior decompression at L4-5 and L5-S1. Conus medullaris and cauda equina: Conus extends to the L1-2 level. Conus and cauda equina appear normal. Paraspinal and other soft tissues: Large fluid collection extending from the laminectomy bed of the lower lumbar spine and into the posterior subcutaneous soft tissues of the low back now measuring up to 9.0 cm in craniocaudal dimension (previously 7.5 cm on 05/30/2024). Disc levels: Unchanged compared to recent prior MRI. IMPRESSION: Large fluid collection extending from the laminectomy bed of the lower lumbar spine and into the posterior subcutaneous soft tissues of the low back now measuring up to 9.0 cm in craniocaudal dimension (previously 7.5 cm on 05/30/2024). Findings are again suspicious for a CSF leak. Electronically Signed   By: Mabel Converse D.O.   On: 06/18/2024 11:14    Microbiology: Results for orders placed or performed during the hospital encounter of 06/27/24  MRSA Next Gen by PCR, Nasal     Status: None   Collection Time: 06/27/24  7:56 PM   Specimen: Nasal Mucosa; Nasal Swab  Result Value Ref Range Status   MRSA by PCR Next Gen NOT DETECTED NOT DETECTED Final    Comment: (NOTE) The GeneXpert MRSA Assay (FDA approved for NASAL specimens only), is one component of a comprehensive MRSA colonization surveillance program. It is not intended to diagnose MRSA infection nor to guide or monitor treatment for MRSA infections. Test performance is not FDA approved in patients less than 18 years old. Performed at [facility], [address] [place], [place] [address]    *Note: Due to a large number of results and/or encounters for the  requested time period, some results have not been displayed. A complete set of results can be found in Results Review.    Labs:  CBC: Recent Labs  Lab 06/27/24 2019 06/30/24 0023  WBC 6.3 7.2  HGB 12.4 10.0*  HCT 38.9 31.8*  MCV 73.5* 74.5*  PLT 205 168   Basic Metabolic Panel: Recent Labs  Lab 06/27/24 2019 06/30/24 0023  NA  --  140  K  --  3.6  CL  --  111  CO2  --  21*  GLUCOSE  --  147*  BUN  --  18  CREATININE 0.76 0.85  CALCIUM   --  8.8*   CBG: Recent Labs  Lab 06/27/24 1942 06/30/24 1629 06/30/24 1859  GLUCAP 136* 146* 212*    Time spent: 35 minutes  Author: Penne LELON Sharps, MD  [facility]

## 2024-07-03 NOTE — Discharge Instructions (Addendum)
 Your surgeon has performed an operation on your lumbar spine (low back) to relieve pressure on one or more nerves. Many times, patients feel better immediately after surgery and can "overdo it." Even if you feel well, it is important that you follow these activity guidelines. If you do not let your back heal properly from the surgery, you can increase the chance of a disc herniation, csf leak and/or return of your symptoms. The following are instructions to help in your recovery once you have been discharged from the hospital.  * It is ok to take NSAIDs after surgery.  You were given a small prescription of pain medication, while you are taking this you should also take a stool softener.  A stool softener was prescribed for you, commonly these are not covered by insurance, should this be the case you can often obtain this over-the-counter for a lower price.  Activity    No bending, lifting, or twisting ("BLT"). Avoid lifting objects heavier than 10 pounds (gallon milk jug).  Where possible, avoid household activities that involve lifting, bending, pushing, or pulling such as laundry, vacuuming, grocery shopping, and childcare. Try to arrange for help from friends and family for these activities while your back heals.  Increase physical activity slowly as tolerated.  Taking short walks is encouraged, but avoid strenuous exercise. Do not jog, run, bicycle, lift weights, or participate in any other exercises unless specifically allowed by your doctor. Avoid prolonged sitting, including car rides.  Talk to your doctor before resuming sexual activity.  You should not drive until cleared by your doctor.  Until released by your doctor, you should not return to work or school.  You should rest at home and let your body heal.   You may shower three days after your surgery.  After showering, lightly dab your incision dry. Do not take a tub bath or go swimming for 3 weeks, or until approved by your doctor  at your follow-up appointment.  If you smoke, we strongly recommend that you quit.  Smoking has been proven to interfere with normal healing in your back and will dramatically reduce the success rate of your surgery. Please contact [facility] (800-QUIT-NOW) and use the resources at www.[facility].com for assistance in stopping smoking.  Surgical Incision   If you have a dressing on your incision, you may remove it three days after your surgery. Keep your incision area clean and dry.  If you have staples or stitches on your incision, you should have a follow up scheduled for removal. If you do not have staples or stitches, you will have steri-strips (small pieces of surgical tape) or Dermabond glue. The steri-strips/glue should begin to peel away within about a week (it is fine if the steri-strips fall off before then). If the strips are still in place one week after your surgery, you may gently remove them.  Diet            You may return to your usual diet. Be sure to stay hydrated.  When to Contact Us   Although your surgery and recovery will likely be uneventful, you may have some residual numbness, aches, and pains in your back and/or legs. This is normal and should improve in the next few weeks.  However, should you experience any of the following, contact us  immediately: New numbness or weakness Pain that is progressively getting worse, and is not relieved by your pain medications or rest Bleeding, redness, swelling, pain, or drainage from surgical incision Chills or  flu-like symptoms Fever greater than 101.0 F (38.3 C) Problems with bowel or bladder functions Difficulty breathing or shortness of breath Warmth, tenderness, or swelling in your calf  Contact Information How to contact us :  If you have any questions/concerns before or after surgery, you can reach us  at [phone], or you can send a mychart message. We can be reached by phone or mychart 8am-4pm, Monday-Friday.   *Please note: Calls after 4pm are forwarded to a third party answering service. Mychart messages are not routinely monitored during evenings, weekends, and holidays. Please call our office to contact the answering service for urgent concerns during non-business hours.   Home Health Care  [facility] will be providing you physical therapy and occupational therapy in the home. Start of care will take place 12/2. They will contact you to schedule the initial assessment. If you do not hear from a liaison please give [facility] a call [phone].

## 2024-07-03 NOTE — Plan of Care (Signed)

## 2024-07-03 NOTE — Progress Notes (Signed)
 Neurosurgery Progress Note  History: SHIVA KARIS is s/p expansion laminectomy fort exploration and repair of CSF leak and placement of lumbar drain. She is 6 Days Post-Op.   POD6: Doing much better today.  No leakage or positional headaches.  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  POD2: Pt reporting back and leg pain. Unchanged this morning  POD1: Pt experiencing back pain and left leg pain this morning which is not new from pre-op   Physical Exam: Vitals:   07/02/24 2001 07/03/24 0350  BP: 100/68 106/75  Pulse: (!) 54 (!) 51  Resp: 16 20  Temp: 98.2 F (36.8 C) 98.2 F (36.8 C)  SpO2: 100% 100%    AA Ox3 CNI  Strength:MAEW  Incision covered with clean and dry dressing   Data:    Latest Ref Rng & Units 06/30/2024   12:23 AM 06/27/2024    8:19 PM 06/19/2024    2:16 PM  BMP  Glucose 70 - 99 mg/dL 852   91   BUN 6 - 20 mg/dL 18   18   Creatinine 9.55 - 1.00 mg/dL 9.14  9.23  9.20   Sodium 135 - 145 mmol/L 140   141   Potassium 3.5 - 5.1 mmol/L 3.6   4.2   Chloride 98 - 111 mmol/L 111   108   CO2 22 - 32 mmol/L 21   25   Calcium  8.9 - 10.3 mg/dL 8.8   9.6       Latest Ref Rng & Units 06/30/2024   12:23 AM 06/27/2024    8:19 PM 06/19/2024    2:16 PM  CBC  WBC 4.0 - 10.5 K/uL 7.2  6.3  6.2   Hemoglobin 12.0 - 15.0 g/dL 89.9  87.5  87.8   Hematocrit 36.0 - 46.0 % 31.8  38.9  38.7   Platelets 150 - 400 K/uL 168  205  220     Intake/Output Summary (Last 24 hours) at 07/03/2024 0701 Last data filed at 07/02/2024 0900 Gross per 24 hour  Intake 240 ml  Output --  Net 240 ml     Assessment/Plan:  TAMIYAH MOULIN is a 58 y.o presenting with recurrent CSF leak s/p expansion laminectomy fort exploration and repair of CSF leak and placement  of lumbar drain.   - Clamped 12/26 afternoon.  Incision was dry, drain removed.  Patient can mobilize. - continue frequent dressing checks  - pain control as outlined.  - BP stable on regimen - DVT prophylaxis - PT/OT plan for DC today with home health   Penne LELON Sharps MD Department of Neurosurgery

## 2024-07-03 NOTE — Progress Notes (Signed)
 Reviewed discharged instructions with patient. Patient acknowledged understanding. Patient received meds to bedside. Patient discharged with personal belongings. Patient wheeled out by staff. Patient transported home via family vehicle. No distress noted in patient.

## 2024-07-03 NOTE — Progress Notes (Signed)
 Physical Therapy Treatment Patient Details Name: Mary Sanford MRN: [id number] DOB: [birth year] Today's Date: 07/03/2024   History of Present Illness admitted for acute hospitalization s/p expansion of laminectomy secondary to recurrent CSF leak; lumbar drain placed during procedure (removed 11/27)    PT Comments  OOB and walks to bathroom on her own with no AD.  She stated she has a RW at home but will likely not use one at home.  Risks/benefits reviewed.  She is able to walk 200' with no AD on unit with decreased balance and cautious gait.  Several LOB's that she recovers on her own or with finger touch assist on rails.  She stated she feels off  encouraged RW at home but she is resistant.  Pt will likely do ok with area's where she can touch walls/sturdy objects but is encouraged to use RW in community and outside.  Agreed.  She declined to trial stairs stating she will sleep downstairs and not need to go up/down and only has small threshold step into her home.  Reviewed stair safety if she opts to try at home.  Voiced understanding and stated she feels good about discharge home and mobility.   If plan is discharge home, recommend the following: A little help with walking and/or transfers;Assistance with cooking/housework;Help with stairs or ramp for entrance;Assist for transportation;A little help with bathing/dressing/bathroom   Can travel by private vehicle        Equipment Recommendations  Rolling walker (2 wheels);BSC/3in1    Recommendations for Other Services       Precautions / Restrictions Precautions Precautions: Fall;Back Restrictions Weight Bearing Restrictions Per Provider Order: No     Mobility  Bed Mobility Overal bed mobility: Modified Independent               Patient Response: Cooperative  Transfers Overall transfer level: Modified independent                      Ambulation/Gait Ambulation/Gait assistance: Supervision Gait Distance  (Feet): 200 Feet Assistive device: None Gait Pattern/deviations: Step-through pattern, Drifts right/left Gait velocity: dec         Stairs             Wheelchair Mobility     Tilt Bed Tilt Bed Patient Response: Cooperative  Modified Rankin (Stroke Patients Only)       Balance Overall balance assessment: Mild deficits observed, not formally tested, Needs assistance Sitting-balance support: No upper extremity supported, Feet supported Sitting balance-Leahy Scale: Good     Standing balance support: No upper extremity supported Standing balance-Leahy Scale: Fair Standing balance comment: improved with walker but pt will opt not to use RW in her home                            Communication Communication Communication: No apparent difficulties  Cognition Arousal: Alert Behavior During Therapy: WFL for tasks assessed/performed   PT - Cognitive impairments: No apparent impairments                         Following commands: Intact      Cueing Cueing Techniques: Verbal cues, Gestural cues, Tactile cues  Exercises      General Comments        Pertinent Vitals/Pain Pain Assessment Pain Assessment: Faces Faces Pain Scale: Hurts a little bit Pain Location: back Pain Descriptors / Indicators: Aching, Discomfort Pain Intervention(s):  Limited activity within patient's tolerance, Monitored during session, Repositioned    Home Living                          Prior Function            PT Goals (current goals can now be found in the care plan section) Progress towards PT goals: Progressing toward goals    Frequency    Min 3X/week      PT Plan      Co-evaluation              AM-PAC PT 6 Clicks Mobility   Outcome Measure  Help needed turning from your back to your side while in a flat bed without using bedrails?: None Help needed moving from lying on your back to sitting on the side of a flat bed without  using bedrails?: None Help needed moving to and from a bed to a chair (including a wheelchair)?: None Help needed standing up from a chair using your arms (e.g., wheelchair or bedside chair)?: None Help needed to walk in hospital room?: A Little Help needed climbing 3-5 steps with a railing? : A Little 6 Click Score: 22    End of Session Equipment Utilized During Treatment: Gait belt Activity Tolerance: Patient tolerated treatment well Patient left: in bed;with call bell/phone within reach Nurse Communication: Mobility status PT Visit Diagnosis: Muscle weakness (generalized) (M62.81);Difficulty in walking, not elsewhere classified (R26.2)     Time: 9176-9166 PT Time Calculation (min) (ACUTE ONLY): 10 min  Charges:    $Gait Training: 8-22 mins PT General Charges $$ ACUTE PT VISIT: 1 Visit                   Lauraine Gills, PTA 07/03/24, 9:03 AM

## 2024-07-13 ENCOUNTER — Ambulatory Visit: Admitting: Neurosurgery

## 2024-07-13 ENCOUNTER — Encounter: Admitting: Physician Assistant

## 2024-07-13 ENCOUNTER — Other Ambulatory Visit: Payer: Self-pay | Admitting: Internal Medicine

## 2024-07-13 ENCOUNTER — Other Ambulatory Visit: Payer: Self-pay | Admitting: Neurosurgery

## 2024-07-13 VITALS — BP 146/94 | Temp 98.9°F | Ht 69.0 in | Wt 206.2 lb

## 2024-07-13 DIAGNOSIS — Z9889 Other specified postprocedural states: Secondary | ICD-10-CM

## 2024-07-13 DIAGNOSIS — G9600 Cerebrospinal fluid leak, unspecified: Secondary | ICD-10-CM

## 2024-07-13 DIAGNOSIS — E269 Hyperaldosteronism, unspecified: Secondary | ICD-10-CM

## 2024-07-13 MED ORDER — MELOXICAM 7.5 MG PO TABS: 7.5000 mg | ORAL_TABLET | Freq: Every day | ORAL | 2 refills | Status: AC

## 2024-07-13 MED ORDER — OXYCODONE HCL 10 MG PO TABS: 10.0000 mg | ORAL_TABLET | ORAL | 0 refills | Status: DC | PRN

## 2024-07-13 NOTE — Progress Notes (Signed)
° °  REFERRING PHYSICIAN:  Joshua Debby CROME, Md [address] [place],  [place] [address]  DOS: 04/22/24, L4-S1 lumbar laminectomy and decompression 06/02/24, CSF leak repair, 06/23/2024 reexploration and repair of CSF leak with lumbar drain placement  HISTORY OF PRESENT ILLNESS: Mary Sanford is 2 weeks status post CSF leak repair with lumbar drain placement.  She is doing very well.  No positional headaches.  Her incision has been flat and healing well.     PHYSICAL EXAMINATION:  General: Patient is well developed, well nourished, calm, collected, and in no apparent distress.   NEUROLOGICAL:  General: In no acute distress.   Awake, alert, oriented to person, place, and time.  Pupils equal round and reactive to light.  Facial tone is symmetric.     Strength:            Side Iliopsoas Quads Hamstring PF DF EHL  R 5 5 5 5 5 5   L 5 5 5 5 5 5    Sutures in place.  Skin healing well.  ROS (Neurologic):  Negative except as noted above  IMAGING: No recent interval imaging  ASSESSMENT/PLAN:  Mary Sanford is 2 weeks status post reexploration repair and lumbar drain placement.  Doing very well.  No evidence of positional headaches.  No evidence of CSF leak.  Refills given.  Will plan to have her come in for a 2-week wound check and suture removal at that time.   Penne MICAEL Sharps, MD Department of neurosurgery

## 2024-07-15 ENCOUNTER — Other Ambulatory Visit: Payer: Self-pay | Admitting: Physician Assistant

## 2024-07-18 ENCOUNTER — Other Ambulatory Visit: Payer: Self-pay | Admitting: Internal Medicine

## 2024-07-18 DIAGNOSIS — I10 Essential (primary) hypertension: Secondary | ICD-10-CM

## 2024-07-26 ENCOUNTER — Ambulatory Visit (INDEPENDENT_AMBULATORY_CARE_PROVIDER_SITE_OTHER)

## 2024-07-26 DIAGNOSIS — Z4802 Encounter for removal of sutures: Secondary | ICD-10-CM

## 2024-07-26 DIAGNOSIS — Z9889 Other specified postprocedural states: Secondary | ICD-10-CM

## 2024-07-26 DIAGNOSIS — G9600 Cerebrospinal fluid leak, unspecified: Secondary | ICD-10-CM

## 2024-07-26 MED ORDER — OXYCODONE HCL 10 MG PO TABS: 10.0000 mg | ORAL_TABLET | Freq: Four times a day (QID) | ORAL | 0 refills | Status: DC | PRN

## 2024-07-26 NOTE — Progress Notes (Signed)
 Patient presents for suture removal. Incision site is clean dry and  intact. Light pain to the touch.   Sutures removed. Patient tolerated well.   Patient asked about restrictions, reinforced spine restrictions listed in her discharge paperwork post surgery 11/24. Refill of oxycodone  sent in by [place]. Patient to follow up as scheduled.

## 2024-08-09 ENCOUNTER — Telehealth: Payer: Self-pay | Admitting: Physician Assistant

## 2024-08-09 ENCOUNTER — Other Ambulatory Visit: Payer: Self-pay | Admitting: Physician Assistant

## 2024-08-09 ENCOUNTER — Encounter: Admitting: Physician Assistant

## 2024-08-09 DIAGNOSIS — Z9889 Other specified postprocedural states: Secondary | ICD-10-CM

## 2024-08-09 DIAGNOSIS — M545 Low back pain, unspecified: Secondary | ICD-10-CM

## 2024-08-09 MED ORDER — OXYCODONE HCL 10 MG PO TABS: 10.0000 mg | ORAL_TABLET | Freq: Four times a day (QID) | ORAL | 0 refills | Status: DC | PRN

## 2024-08-09 MED ORDER — TIZANIDINE HCL 2 MG PO TABS: 2.0000 mg | ORAL_TABLET | Freq: Three times a day (TID) | ORAL | 2 refills | Status: AC | PRN

## 2024-08-09 NOTE — Telephone Encounter (Signed)
 Date: 08/09/24 Provider FRISBIE Med Requesting Oxycodone  10 mg, Tizanidine  2mg    Pharmacy: [address] [place], [place] Patient contact: [phone]

## 2024-08-16 ENCOUNTER — Other Ambulatory Visit: Payer: Self-pay | Admitting: Physician Assistant

## 2024-08-16 NOTE — Telephone Encounter (Signed)
 Not sure if patient was advised, I called and left a message letting patient know medications refilled if she was not aware

## 2024-08-19 ENCOUNTER — Other Ambulatory Visit: Payer: Self-pay | Admitting: Internal Medicine

## 2024-08-22 ENCOUNTER — Encounter: Admitting: Physician Assistant

## 2024-08-23 ENCOUNTER — Emergency Department (HOSPITAL_COMMUNITY)

## 2024-08-23 ENCOUNTER — Other Ambulatory Visit: Payer: Self-pay

## 2024-08-23 ENCOUNTER — Emergency Department (HOSPITAL_COMMUNITY)
Admission: EM | Admit: 2024-08-23 | Discharge: 2024-08-23 | Disposition: A | Discharge status: Home / Self Care | Attending: Emergency Medicine | Admitting: Emergency Medicine

## 2024-08-23 ENCOUNTER — Encounter: Payer: Self-pay | Admitting: Internal Medicine

## 2024-08-23 ENCOUNTER — Ambulatory Visit: Admitting: Internal Medicine

## 2024-08-23 VITALS — BP 180/120 | HR 71 | Temp 98.8°F | Resp 16 | Ht 69.0 in | Wt 212.4 lb

## 2024-08-23 DIAGNOSIS — E269 Hyperaldosteronism, unspecified: Secondary | ICD-10-CM

## 2024-08-23 DIAGNOSIS — I10 Essential (primary) hypertension: Secondary | ICD-10-CM | POA: Diagnosis present

## 2024-08-23 DIAGNOSIS — E538 Deficiency of other specified B group vitamins: Secondary | ICD-10-CM | POA: Diagnosis not present

## 2024-08-23 DIAGNOSIS — R519 Headache, unspecified: Secondary | ICD-10-CM | POA: Insufficient documentation

## 2024-08-23 DIAGNOSIS — E785 Hyperlipidemia, unspecified: Secondary | ICD-10-CM

## 2024-08-23 DIAGNOSIS — I160 Hypertensive urgency: Secondary | ICD-10-CM | POA: Diagnosis not present

## 2024-08-23 LAB — BASIC METABOLIC PANEL WITH GFR
Anion gap: 9 (ref 5–15)
BUN: 12 mg/dL (ref 6–20)
CO2: 25 mmol/L (ref 22–32)
Calcium: 9.8 mg/dL (ref 8.9–10.3)
Chloride: 111 mmol/L (ref 98–111)
Creatinine, Ser: 0.79 mg/dL (ref 0.44–1.00)
GFR, Estimated: >60 mL/min
Glucose, Bld: 83 mg/dL (ref 70–99)
Potassium: 3.9 mmol/L (ref 3.5–5.1)
Sodium: 146 mmol/L — ABNORMAL HIGH (ref 135–145)

## 2024-08-23 LAB — CBC WITH DIFFERENTIAL/PLATELET
Abs Immature Granulocytes: 0 K/uL (ref 0.00–0.07)
Basophils Absolute: 0.1 K/uL (ref 0.0–0.1)
Basophils Relative: 2 %
Eosinophils Absolute: 0.5 K/uL (ref 0.0–0.5)
Eosinophils Relative: 10 %
HCT: 40.9 % (ref 36.0–46.0)
Hemoglobin: 12.5 g/dL (ref 12.0–15.0)
Immature Granulocytes: 0 %
Lymphocytes Relative: 41 %
Lymphs Abs: 2.1 K/uL (ref 0.7–4.0)
MCH: 22.6 pg — ABNORMAL LOW (ref 26.0–34.0)
MCHC: 30.6 g/dL (ref 30.0–36.0)
MCV: 74 fL — ABNORMAL LOW (ref 80.0–100.0)
Monocytes Absolute: 0.5 K/uL (ref 0.1–1.0)
Monocytes Relative: 10 %
Neutro Abs: 1.9 K/uL (ref 1.7–7.7)
Neutrophils Relative %: 37 %
Platelets: 221 K/uL (ref 150–400)
RBC: 5.53 MIL/uL — ABNORMAL HIGH (ref 3.87–5.11)
RDW: 14.1 % (ref 11.5–15.5)
WBC: 5 K/uL (ref 4.0–10.5)
nRBC: 0 % (ref 0.0–0.2)

## 2024-08-23 MED ORDER — AMLODIPINE BESYLATE 10 MG PO TABS: 10.0000 mg | ORAL_TABLET | Freq: Every day | ORAL | 0 refills | Status: AC

## 2024-08-23 MED ORDER — AMLODIPINE BESYLATE 5 MG PO TABS
10.0000 mg | ORAL_TABLET | Freq: Every day | ORAL | Status: DC
  Administered 2024-08-23: 10 mg via ORAL
  Filled 2024-08-23: qty 2

## 2024-08-23 MED ORDER — OXYCODONE HCL 5 MG PO TABS
10.0000 mg | ORAL_TABLET | Freq: Once | ORAL | Status: AC
  Administered 2024-08-23: 10 mg via ORAL
  Filled 2024-08-23: qty 2

## 2024-08-23 MED ORDER — CYANOCOBALAMIN 1000 MCG/ML IJ SOLN
1000.0000 ug | Freq: Once | INTRAMUSCULAR | Status: AC
  Administered 2024-08-23: 1000 ug via INTRAMUSCULAR

## 2024-08-23 MED ORDER — NEBIVOLOL HCL 10 MG PO TABS
10.0000 mg | ORAL_TABLET | Freq: Every day | ORAL | Status: DC
  Administered 2024-08-23: 10 mg via ORAL
  Filled 2024-08-23: qty 1

## 2024-08-23 MED ORDER — ATORVASTATIN CALCIUM 10 MG PO TABS: 10.0000 mg | ORAL_TABLET | ORAL | 0 refills | Status: AC

## 2024-08-23 MED ORDER — NEBIVOLOL HCL 10 MG PO TABS: 10.0000 mg | ORAL_TABLET | Freq: Every day | ORAL | 0 refills | Status: AC

## 2024-08-23 MED ORDER — ACETAMINOPHEN 500 MG PO TABS
1000.0000 mg | ORAL_TABLET | Freq: Once | ORAL | Status: AC
  Administered 2024-08-23: 1000 mg via ORAL
  Filled 2024-08-23: qty 2

## 2024-08-23 MED ORDER — GABAPENTIN 300 MG PO CAPS
300.0000 mg | ORAL_CAPSULE | Freq: Once | ORAL | Status: AC
  Administered 2024-08-23: 300 mg via ORAL
  Filled 2024-08-23: qty 1

## 2024-08-23 NOTE — Progress Notes (Signed)
 "  Subjective:  Patient ID: Mary Sanford, female    DOB: Oct 14, 1965  Age: 59 y.o. MRN: [id number]  CC: Hypertension (Medication refills )   HPI [name] presents for f/up ---  Discussed the use of AI scribe software for clinical note transcription with the patient, who gave verbal consent to proceed.  History of Present Illness Mary Sanford is a 59 year old female with hypertension and prediabetes who presents with weight gain and back pain.  She has experienced weight gain due to restrictions on physical activity following back procedures. She is concerned about the weight gain and the potential progression to diabetes, given her prediabetic status.   She experiences constant back pain that persists despite undergoing three procedures. She feels off balance, particularly in her legs, and describes her gait as 'walking like I'm drunk.' There is no numbness, weakness, or tingling, but symptoms radiate from the back down to her left leg.  She has had blurred vision for the past two weeks, initially attributing it to her glasses. She has not taken her blood pressure medication for about a week due to running out. Spironolactone  was stopped when she started taking diamox .  She feels shaky at times and needs to eat to stabilize herself, which she worries might be related to her prediabetes. No headaches, chest pain, shortness of breath, numbness, weakness, or tingling.     Outpatient Medications Prior to Visit  Medication Sig Dispense Refill   acetaZOLAMIDE  (DIAMOX ) 125 MG tablet Take 1 tablet (125 mg total) by mouth daily. 30 tablet 0   escitalopram  (LEXAPRO ) 20 MG tablet Take 1 tablet by mouth once daily 90 tablet 0   gabapentin  (NEURONTIN ) 300 MG capsule TAKE 1 CAPSULE BY MOUTH THREE TIMES DAILY 90 capsule 0   meloxicam  (MOBIC ) 7.5 MG tablet Take 1 tablet (7.5 mg total) by mouth daily. 30 tablet 2   Misc Natural Products (BEET ROOT PO) Take 2 tablets by mouth daily.      tiZANidine  (ZANAFLEX ) 2 MG tablet Take 1-2 tablets (2-4 mg total) by mouth every 8 (eight) hours as needed for muscle spasms. 90 tablet 2   amLODipine  (NORVASC ) 10 MG tablet Take 1 tablet by mouth once daily 90 tablet 0   atorvastatin  (LIPITOR) 10 MG tablet Take 1 tablet by mouth once daily (Patient taking differently: Take 10 mg by mouth every morning.) 90 tablet 1   nebivolol  (BYSTOLIC ) 10 MG tablet Take 1 tablet by mouth once daily 90 tablet 0   Oxycodone  HCl 10 MG TABS Take 1 tablet (10 mg total) by mouth every 6 (six) hours as needed. 42 tablet 0   No facility-administered medications prior to visit.    ROS Review of Systems  Constitutional:  Negative for appetite change, chills, diaphoresis, fatigue and fever.  HENT: Negative.    Eyes:  Positive for visual disturbance. Negative for photophobia.  Respiratory:  Negative for cough, chest tightness, shortness of breath and wheezing.   Cardiovascular:  Negative for chest pain, palpitations and leg swelling.  Gastrointestinal: Negative.  Negative for abdominal pain, diarrhea, nausea and vomiting.  Endocrine: Negative.   Genitourinary: Negative.  Negative for difficulty urinating and dysuria.  Musculoskeletal:  Positive for back pain and gait problem.  Neurological:  Negative for dizziness, weakness and headaches.  Hematological: Negative.  Negative for adenopathy. Does not bruise/bleed easily.  Psychiatric/Behavioral: Negative.      Objective:  BP (!) 180/120 (BP Location: Left Arm, Patient Position: Sitting, Cuff Size: Normal)  Comment: BP 180/120 (R)  Pulse 71   Temp 98.8 F (37.1 C) (Oral)   Resp 16   Ht 5' 9 (1.753 m)   Wt 212 lb 6.4 oz (96.3 kg)   LMP 05/15/2021   SpO2 96%   BMI 31.37 kg/m   BP Readings from Last 3 Encounters:  08/24/24 (!) 188/112  08/23/24 (!) 190/112  08/23/24 (!) 180/120    Wt Readings from Last 3 Encounters:  08/24/24 212 lb (96.2 kg)  08/23/24 212 lb 6.4 oz (96.3 kg)  07/13/24 206 lb 4 oz  (93.6 kg)    Physical Exam Vitals reviewed.  Constitutional:      Appearance: Normal appearance.  HENT:     Mouth/Throat:     Mouth: Mucous membranes are moist.  Eyes:     Conjunctiva/sclera: Conjunctivae normal.  Cardiovascular:     Rate and Rhythm: Normal rate and regular rhythm.     Heart sounds: No murmur heard.    No friction rub. No gallop.  Pulmonary:     Effort: Pulmonary effort is normal.     Breath sounds: No stridor. No wheezing, rhonchi or rales.  Abdominal:     General: Abdomen is flat.     Palpations: There is no mass.     Tenderness: There is no abdominal tenderness. There is no guarding.     Hernia: No hernia is present.  Musculoskeletal:        General: Normal range of motion.     Cervical back: Neck supple.     Right lower leg: No edema.     Left lower leg: No edema.  Lymphadenopathy:     Cervical: No cervical adenopathy.  Skin:    General: Skin is warm and dry.  Neurological:     Mental Status: She is alert. Mental status is at baseline.  Psychiatric:        Mood and Affect: Mood normal.        Behavior: Behavior normal.     Lab Results  Component Value Date   WBC 5.0 08/23/2024   HGB 12.5 08/23/2024   HCT 40.9 08/23/2024   PLT 221 08/23/2024   GLUCOSE 83 08/23/2024   CHOL 121 09/10/2023   TRIG 263.0 (H) 09/10/2023   HDL 57.70 09/10/2023   LDLCALC 11 09/10/2023   ALT 45 (H) 02/08/2024   AST 41 02/08/2024   NA 146 (H) 08/23/2024   K 3.9 08/23/2024   CL 111 08/23/2024   CREATININE 0.79 08/23/2024   BUN 12 08/23/2024   CO2 25 08/23/2024   TSH 1.07 02/16/2024   INR 1.1 06/27/2022   HGBA1C 6.1 02/16/2024    DG Lumbar Spine 2-3 Views Result Date: 06/27/2024 EXAM: 2 or 3 VIEW(S) XRAY OF THE LUMBAR SPINE 06/27/2024 05:40:00 PM COMPARISON: MRI 06/17/2024. CLINICAL HISTORY: Intraoperative for expansion of laminectomy and exploration of CSF leak with primary closure and placement of lumbar drain. FINDINGS: LUMBAR SPINE: BONES: Posterior  decompression observed at L4-L5. Instrumentation is present. Spot images demonstrate placement of a lumbar drain at about the L2-L3 level. DISCS AND DEGENERATIVE CHANGES: No severe degenerative changes. SOFT TISSUES: A lumbar drain is present. No acute abnormality. IMPRESSION: 1. Posterior decompression at L4-5. 2. Instrumentation and placement of a lumbar drain at about the L2-L3 level. Electronically signed by: Ryan Salvage MD 06/27/2024 09:40 PM EST RP Workstation: [id number]   DG C-Arm 1-60 Min-No Report Result Date: 06/27/2024 Fluoroscopy was utilized by the requesting physician.  No radiographic interpretation.   DG  C-Arm 1-60 Min-No Report Result Date: 06/27/2024 Fluoroscopy was utilized by the requesting physician.  No radiographic interpretation.    Assessment & Plan:   Hypertensive urgency, malignant- She was referred to the ED.  B12 deficiency -     Cyanocobalamin      Follow-up: No follow-ups on file.  Debby Molt, MD "

## 2024-08-23 NOTE — ED Provider Notes (Signed)
 "  Emergency Department Provider Note   I have reviewed the triage vital signs and the nursing notes.   HISTORY  Chief Complaint Hypertension   HPI Mary Sanford is a 59 y.o. female with past medical history reviewed below including hypertension and spine surgery with postoperative CSF leak now repaired presents to the emergency department with elevated blood pressure in the setting of running out of her medicine in the past week.  She is normally compliant with her meds but ran out of her prescription and was following with her PCP today for refills.  Her blood pressure was high in the office and she describes some blurry vision along with feeling off balance while ambulating.  This has been going on for the past 2 weeks at least.  No unilateral weakness or numbness.  No speech disturbance.  No double vision. Off balance sensation seemed to start around the time of her most recent surgery.   Past Medical History:  Diagnosis Date   Allergy    Arthritis    hands, left knee   Chronic low back pain    Depression    Dyslipidemia    Family history of adverse reaction to anesthesia    one sister ponv and slow to awaken   GAD (generalized anxiety disorder)    History of kidney stones    Hyperaldosteronism    followed by pcp   (abd MRI in epic 10-13-2023 normal)   Iron deficiency anemia    Malignant hypertension    ED visit in epic 09-22-2023 HTN urgency, bp 165/101;   previously sent to GYN office 09-15-2023 for bp 193/ 115   Panic disorder    PMB (postmenopausal bleeding)    Postoperative CSF leak    Pre-diabetes    followed by pcp   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 tanda   S/P lumbar laminectomy    Seasonal allergies    SUI (stress urinary incontinence, female)    Uterine fibroid    Wears contact lenses    Wears glasses     Review of Systems  Constitutional: No fever/chills Cardiovascular: Denies chest pain. Positive elevated BP.   Respiratory: Denies shortness of breath. Gastrointestinal: No abdominal pain.  No nausea, no vomiting. Musculoskeletal: Positive for back pain. Neurological: Negative HA. Positive feeling off balance while walking.   ____________________________________________   PHYSICAL EXAM:  VITAL SIGNS: ED Triage Vitals [08/23/24 1026]  Encounter Vitals Group     BP (!) 195/127     Pulse Rate 76     Resp 20     Temp 98 F (36.7 C)     Temp Source Oral     SpO2 98 %   Constitutional: Alert and oriented. Well appearing and in no acute distress. Eyes: Conjunctivae are normal. PERRL. EOMI. Head: Atraumatic. Nose: No congestion/rhinnorhea. Mouth/Throat: Mucous membranes are moist.  Neck: No stridor. Cardiovascular: Normal rate, regular rhythm. Good peripheral circulation. Grossly normal heart sounds.   Respiratory: Normal respiratory effort.  No retractions. Lungs CTAB. Gastrointestinal: Soft and nontender. No distention.  Musculoskeletal: No lower extremity tenderness nor edema. No gross deformities of extremities. Neurologic:  Normal speech and language. No gross focal neurologic deficits are appreciated.  5/5 the bilateral upper and lower extremities.  Normal sensation throughout.  No facial asymmetry.  Gait is normal.  Skin:  Skin is warm, dry and intact. No rash noted.   ____________________________________________   LABS (all labs ordered are listed, but only abnormal  results are displayed)  Labs Reviewed  BASIC METABOLIC PANEL WITH GFR - Abnormal; Notable for the following components:      Result Value   Sodium 146 (*)    All other components within normal limits  CBC WITH DIFFERENTIAL/PLATELET - Abnormal; Notable for the following components:   RBC 5.53 (*)    MCV 74.0 (*)    MCH 22.6 (*)    All other components within normal limits   ____________________________________________  RADIOLOGY  CT Head Wo Contrast Result Date: 08/23/2024 EXAM: CT HEAD WITHOUT CONTRAST  08/23/2024 11:00:00 AM TECHNIQUE: CT of the head was performed without the administration of intravenous contrast. Automated exposure control, iterative reconstruction, and/or weight based adjustment of the mA/kV was utilized to reduce the radiation dose to as low as reasonably achievable. COMPARISON: Head CT 09/22/2023 and MRI 06/30/2022. CLINICAL HISTORY: Headache, neuro deficit. Hypertension. Blurred vision. FINDINGS: BRAIN AND VENTRICLES: There is no evidence of an acute infarct, intracranial hemorrhage, mass, midline shift, hydrocephalus, or extra-axial fluid collection. Cerebral volume is normal. ORBITS: No acute abnormality. SINUSES: No acute abnormality. SOFT TISSUES AND SKULL: No acute soft tissue abnormality. No skull fracture. IMPRESSION: 1. Negative head CT. Electronically signed by: Dasie Hamburg MD 08/23/2024 11:07 AM EST RP Workstation: [id number]    ____________________________________________   PROCEDURES  Procedure(s) performed:   Procedures  None  ____________________________________________   INITIAL IMPRESSION / ASSESSMENT AND PLAN / ED COURSE  Pertinent labs & imaging results that were available during my care of the patient were reviewed by me and considered in my medical decision making (see chart for details).   This patient is Presenting for Evaluation of HTN, which does require a range of treatment options, and is a complaint that involves a high risk of morbidity and mortality.  The Differential Diagnoses include asymptomatic hypertension, acute hypertensive emergency, stroke, [name], etc.  Critical Interventions-    Medications  amLODipine  (NORVASC ) tablet 10 mg (10 mg Oral Given 08/23/24 1132)  nebivolol  (BYSTOLIC ) tablet 10 mg (10 mg Oral Given 08/23/24 1149)  oxyCODONE  (Oxy IR/ROXICODONE ) immediate release tablet 10 mg (10 mg Oral Given 08/23/24 1131)  acetaminophen  (TYLENOL ) tablet 1,000 mg (1,000 mg Oral Given 08/23/24 1131)  gabapentin  (NEURONTIN ) capsule 300  mg (300 mg Oral Given 08/23/24 1132)    Reassessment after intervention:  No new symptoms. Briskly ambulatory without difficulty in the department.   Clinical Laboratory Tests Ordered, included CBC without leukocytosis.  No anemia.  No acute kidney injury.  Potassium normal at 3.9.  Radiologic Tests Ordered, included CT head. I independently interpreted the images and agree with radiology interpretation.   Cardiac Monitor Tracing which shows NSR.    Social Determinants of Health Risk patient is not an active smoker.   Medical Decision Making: Summary:  Patient presents emergency department with elevated blood pressure in the setting of being off of her medicines for the past week.  She describes some associated blurry vision and feeling off balance with ambulation.  Her neurologic exam is completely intact including gait.  Overall my suspicion for acute hypertensive emergency is low.  Plan for screening blood work and head imaging given her fairly vague neurosymptoms and will give doses of her home blood pressure medications here.  Reevaluation with update and discussion with patient.  CT head unremarkable.  Labs are largely reassuring.  I have sent refills of her blood pressure medication along with her statin medication to her pharmacy of choice.  Advised close monitoring of her blood pressure at home  with PCP follow-up to make adjustments as needed.  I have found no evidence of acute hypertensive emergency requiring admission although admission was considered.  Patient's presentation is most consistent with acute presentation with potential threat to life or bodily function.   Disposition: discharge  ____________________________________________  FINAL CLINICAL IMPRESSION(S) / ED DIAGNOSES  Final diagnoses:  Primary hypertension    Note:  This document was prepared using Dragon voice recognition software and may include unintentional dictation errors.  Fonda Law, MD,  [facility]    Lavone Weisel, Fonda MATSU, MD 08/23/24 1256  "

## 2024-08-23 NOTE — ED Triage Notes (Signed)
 PT ambulatory to triage from PCP. Pt was at an appointment and found to be 180/120, and endorsed blurred vision. Pt states that she has not had her BP medication for 4-5 days. PT appears to be in no distress.

## 2024-08-23 NOTE — Discharge Instructions (Signed)
 I have called in refills of your blood pressure and cholesterol medications.  Please take these as prescribed and follow closely with your primary care doctor in the coming 1 to 2 weeks for blood pressure recheck.  They can adjust your medicines as needed.  You develop chest pain, weakness/numbness, sudden severe headache, or confusion please return to the emergency department immediately and/or call 911.

## 2024-08-24 ENCOUNTER — Ambulatory Visit: Admitting: Physician Assistant

## 2024-08-24 ENCOUNTER — Encounter: Payer: Self-pay | Admitting: Physician Assistant

## 2024-08-24 VITALS — BP 188/112 | Ht 69.0 in | Wt 212.0 lb

## 2024-08-24 DIAGNOSIS — G894 Chronic pain syndrome: Secondary | ICD-10-CM

## 2024-08-24 DIAGNOSIS — G9600 Cerebrospinal fluid leak, unspecified: Secondary | ICD-10-CM

## 2024-08-24 DIAGNOSIS — Z9889 Other specified postprocedural states: Secondary | ICD-10-CM

## 2024-08-24 MED ORDER — OXYCODONE HCL 5 MG PO TABS: 5.0000 mg | ORAL_TABLET | Freq: Three times a day (TID) | ORAL | 0 refills | Status: AC | PRN

## 2024-08-24 NOTE — Progress Notes (Signed)
" ° °  REFERRING PHYSICIAN:  Joshua Debby CROME, Md [address] [place],  [place] [address]  DOS: 04/22/24, L4-S1 lumbar laminectomy and decompression 06/02/24, CSF leak repair, 06/23/2024 reexploration and repair of CSF leak with lumbar drain placement  HISTORY OF PRESENT ILLNESS: Mary Sanford is 9 weeks status post CSF leak repair with lumbar drain placement.  She is doing pretty well.  She still has some soreness in her back as expected with some intermittent radiating pain to her left leg into her calf.  She denies any positional headaches or new swelling at her incision site.    PHYSICAL EXAMINATION:  General: Patient is well developed, well nourished, calm, collected, and in no apparent distress.   NEUROLOGICAL:  General: In no acute distress.   Awake, alert, oriented to person, place, and time.  Pupils equal round and reactive to light.  Facial tone is symmetric.     Strength:            Side Iliopsoas Quads Hamstring PF DF EHL  R 5 5 5 5 5 5   L 5 5 5 5 5 5    Incision is well-appearing.  No swelling.  ROS (Neurologic):  Negative except as noted above  IMAGING: No recent interval imaging  ASSESSMENT/PLAN:  Mary Sanford is 9 weeks status post CSF leak repair with lumbar drain placement.  She is doing pretty well.  She still has some soreness in her back as expected with some intermittent radiating pain to her left leg into her calf.  She denies any positional headaches or new swelling at her incision site.  Discussed with patient at length today about weaning her oxycodone .  Plan to go to oxycodone  5 mg.  I have also placed a pain clinic referral to help manage this in the future.  In addition, patient is inquiring about physical therapy.  I have placed a referral for physical therapy.  Plan for her to return to work on 09/15/2024.  Will see back prior to starting again.    Lyle Decamp, PA-C Department of neurosurgery    "

## 2024-08-25 MED ORDER — SPIRONOLACTONE 25 MG PO TABS: 25.0000 mg | ORAL_TABLET | Freq: Every day | ORAL | 0 refills | Status: AC

## 2024-08-25 NOTE — Addendum Note (Signed)
 Addended by: JOSHUA DEBBY CROME on: 08/25/2024 07:42 AM   Modules accepted: Orders

## 2024-08-29 ENCOUNTER — Ambulatory Visit: Admitting: Internal Medicine

## 2024-08-31 ENCOUNTER — Encounter: Payer: Self-pay | Admitting: *Deleted

## 2024-08-31 NOTE — Progress Notes (Signed)
 Mary Sanford                                          MRN: [id number]   08/31/2024   The [facility] Quality Team Specialist reviewed this patient medical record for the purposes of chart review for care gap closure. The following were reviewed: chart review for care gap closure-controlling blood pressure.    [facility] Quality Team

## 2024-09-06 ENCOUNTER — Other Ambulatory Visit: Payer: Self-pay | Admitting: Internal Medicine

## 2024-09-08 ENCOUNTER — Telehealth: Payer: Self-pay | Admitting: Neurosurgery

## 2024-09-08 NOTE — Telephone Encounter (Signed)
 Patient's post op appointment has been changed to 09/20/2024 and she is needing an updated work note that extends her leave. She was originally scheduled to return to work on 09/15/2024. She would like this sent through MyChart.

## 2024-09-09 NOTE — Telephone Encounter (Signed)
 Spoke with the patient and told her about the work note. She verbalized understanding.

## 2024-09-12 ENCOUNTER — Encounter: Admitting: Physician Assistant

## 2024-09-20 ENCOUNTER — Encounter: Admitting: Neurosurgery

## 2024-11-10 ENCOUNTER — Encounter (HOSPITAL_COMMUNITY): Payer: Self-pay | Admitting: Urgent Care

## 2024-11-10 ENCOUNTER — Other Ambulatory Visit (HOSPITAL_COMMUNITY)

## 2024-11-10 ENCOUNTER — Ambulatory Visit (HOSPITAL_COMMUNITY): Admit: 2024-11-10

## 2024-11-10 SURGERY — RADIOLOGY WITH ANESTHESIA
Anesthesia: General
